# Patient Record
Sex: Female | Born: 1965 | Race: Asian | Hispanic: No | Marital: Married | Smoking: Never smoker
Health system: Southern US, Community
[De-identification: ages and names within clinical notes are randomized; demographics above are authoritative.]

## PROBLEM LIST (undated history)

## (undated) DIAGNOSIS — H409 Unspecified glaucoma: Secondary | ICD-10-CM

## (undated) DIAGNOSIS — R197 Diarrhea, unspecified: Secondary | ICD-10-CM

## (undated) DIAGNOSIS — C20 Malignant neoplasm of rectum: Secondary | ICD-10-CM

## (undated) DIAGNOSIS — Z923 Personal history of irradiation: Secondary | ICD-10-CM

## (undated) HISTORY — PX: OTHER SURGICAL HISTORY: SHX169

## (undated) HISTORY — PX: TUBAL LIGATION: SHX77

## (undated) HISTORY — PX: CARPAL TUNNEL RELEASE: SHX101

## (undated) HISTORY — DX: Unspecified glaucoma: H40.9

## (undated) HISTORY — DX: Personal history of irradiation: Z92.3

## (undated) HISTORY — DX: Malignant neoplasm of rectum: C20

---

## 2007-11-11 ENCOUNTER — Other Ambulatory Visit: Admission: RE | Admit: 2007-11-11 | Discharge: 2007-11-11 | Payer: Self-pay | Admitting: Obstetrics and Gynecology

## 2010-05-31 ENCOUNTER — Ambulatory Visit (HOSPITAL_COMMUNITY): Admission: RE | Admit: 2010-05-31 | Discharge: 2010-05-31 | Payer: Self-pay | Admitting: Orthopaedic Surgery

## 2010-12-08 LAB — URINALYSIS, ROUTINE W REFLEX MICROSCOPIC
Bilirubin Urine: NEGATIVE
Glucose, UA: NEGATIVE mg/dL
Protein, ur: NEGATIVE mg/dL
Specific Gravity, Urine: >1.03 — ABNORMAL HIGH (ref 1.005–1.030)

## 2010-12-08 LAB — DIFFERENTIAL
Eosinophils Absolute: 0.2 10*3/uL (ref 0.0–0.7)
Lymphs Abs: 2.4 10*3/uL (ref 0.7–4.0)
Monocytes Absolute: 0.5 10*3/uL (ref 0.1–1.0)
Monocytes Relative: 7 % (ref 3–12)
Neutrophils Relative %: 52 % (ref 43–77)

## 2010-12-08 LAB — CBC
HCT: 31.9 % — ABNORMAL LOW (ref 36.0–46.0)
Hemoglobin: 10.3 g/dL — ABNORMAL LOW (ref 12.0–15.0)
MCH: 24.4 pg — ABNORMAL LOW (ref 26.0–34.0)
MCHC: 32.2 g/dL (ref 30.0–36.0)
RBC: 4.22 MIL/uL (ref 3.87–5.11)

## 2010-12-08 LAB — COMPREHENSIVE METABOLIC PANEL
ALT: 12 U/L (ref 0–35)
AST: 20 U/L (ref 0–37)
Albumin: 3.7 g/dL (ref 3.5–5.2)
Alkaline Phosphatase: 44 U/L (ref 39–117)
Calcium: 9.3 mg/dL (ref 8.4–10.5)
GFR calc Af Amer: >60 mL/min (ref >60)
Glucose, Bld: 134 mg/dL — ABNORMAL HIGH (ref 70–99)
Potassium: 3.6 mEq/L (ref 3.5–5.1)
Sodium: 134 mEq/L — ABNORMAL LOW (ref 135–145)
Total Protein: 6.2 g/dL (ref 6.0–8.3)

## 2010-12-08 LAB — SURGICAL PCR SCREEN: Staphylococcus aureus: NEGATIVE

## 2010-12-08 LAB — URINE MICROSCOPIC-ADD ON

## 2011-07-17 ENCOUNTER — Encounter: Payer: Self-pay | Admitting: Urgent Care

## 2011-07-17 ENCOUNTER — Ambulatory Visit (INDEPENDENT_AMBULATORY_CARE_PROVIDER_SITE_OTHER): Payer: BC Managed Care – PPO | Admitting: Urgent Care

## 2011-07-17 VITALS — BP 125/79 | HR 62 | Temp 97.6°F | Ht <= 58 in | Wt 99.6 lb

## 2011-07-17 DIAGNOSIS — R197 Diarrhea, unspecified: Secondary | ICD-10-CM

## 2011-07-17 DIAGNOSIS — R109 Unspecified abdominal pain: Secondary | ICD-10-CM

## 2011-07-17 DIAGNOSIS — R1031 Right lower quadrant pain: Secondary | ICD-10-CM | POA: Insufficient documentation

## 2011-07-17 NOTE — Patient Instructions (Signed)
Continue Bentyl 30 minutes before each meal as needed for diarrhea Begin ALIGN once daily for bloating, gas, and diarrhea Return your stool studies to the lab as soon as possible and have your labs drawn. We will call you with results.  Diarrhea Diarrhea is watery poop (stool). The most common cause of diarrhea is a germ. Other causes include:  Food poisoning.   A reaction to medicine.  HOME CARE   Drink clear fluids. This can stop you from losing too much body fluid (dehydration).   Drink enough fluids to keep your pee (urine) clear or pale yellow.   Avoid solid foods and dairy products until you start to feel better. Then start eating bland foods, such as:   Bananas.   Rice.   Crackers.   Applesauce.   Dry toast.   Avoid spicy foods, caffeine, and alcohol.   Your doctor may give medicine to help with cramps and watery poop. Take this as told. Avoid these medicines if you have a fever or blood in your poop.   Take your medicine as told. Finish them even if you start to feel better.  GET HELP RIGHT AWAY IF:   The watery poop lasts longer than 3 days.   You have a fever.   Your baby is older than 3 months with a rectal temperature of 100.5 F (38.1 C) or higher for more than 1 day.   There is blood in your poop.   You start to throw up (vomit).   You lose too much fluid.  MAKE SURE YOU:   Understand these instructions.   Will watch your condition.   Will get help right away if you are not doing well or get worse.  Document Released: 02/28/2008 Document Revised: 05/24/2011 Document Reviewed: 02/28/2008 [facility] Patient Information 2012 [place], [place].

## 2011-07-17 NOTE — Assessment & Plan Note (Signed)
Suspect secondary to acute diarrheal illness.

## 2011-07-17 NOTE — Progress Notes (Signed)
Cc to PCP 

## 2011-07-17 NOTE — Assessment & Plan Note (Addendum)
Renee Nicholson is a 45 y.o. female with chronic diarrhea for 2 months.  She has no hx of IBS-type symptoms in the past.  I suspect acute infectious etiology.  Will check thyroid.  Agree w/ anti-spasmodic. Will add probiotic. Check baseline CBC and CMP. If no relief with supportive measures, she is going to colonoscopy with terminal ileoscopy and random biopsies.  Full set of stool studies. Check TTg/IgA

## 2011-07-17 NOTE — Progress Notes (Signed)
Referring Provider: Dr. Sherril Croon Primary Care Physician:  [name], MD, MD Primary Gastroenterologist:  Dr. Darrick Penna  Chief Complaint  Patient presents with  . Diarrhea    goes to the bathroom right after she eats eveytime  . Gas    alot of gas  . Abdominal Pain    HPI:  Renee Nicholson is a 45 y.o. female here as a referral from Dr. Sherril Croon for persistent diarrhea.  Lots of diarrhea x 2 months.  Bentyl & imodium seem to help some, but not 100%.  Every time she eats she has to run to the bathroom.  Very smelly gas & mid-abdominal pain.  Pain 6/10 & intermittent.  Diarrhea 5-6 times per day w/ mucus.  Denies rectal bleeding or melena.  Wt stable.  C/o anorexia secondary to diarrhea.  Denies nausea or vomiting.   Denies any upper GI symptoms including heartburn, indigestion, nausea, vomiting, dysphagia, odynophagia or anorexia.  No ill contacts, no foreign travel, no recent antibiotics, no new herbal supplements.    No past medical history on file.  Past Surgical History  Procedure Date  . Tubal ligation   . Carpal tunnel release     Current Outpatient Prescriptions  Medication Sig Dispense Refill  . dicyclomine (BENTYL) 10 MG capsule Take 10 mg by mouth 4 (four) times daily -  before meals and at bedtime.        . diphenoxylate-atropine (LOMOTIL) 2.5-0.025 MG per tablet Take 1 tablet by mouth 4 (four) times daily as needed.          Allergies as of 07/17/2011  . (No Known Allergies)    Family History  Problem Relation Age of Onset  . Diabetes Mother     History   Social History  . Marital Status: Married    Spouse Name: N/A    Number of Children: 2  . Years of Education: N/A   Occupational History  . self employed, [employer]    Social History Main Topics  . Smoking status: Never Smoker   . Smokeless tobacco: Not on file  . Alcohol Use: No  . Drug Use: No  . Sexually Active: Not on file   Review of Systems: Gen: Denies any fever, chills, sweats, anorexia,  fatigue, weakness, malaise, weight loss, and sleep disorder. CV: Denies chest pain, angina, palpitations, syncope, orthopnea, PND, peripheral edema, and claudication. Resp: Denies dyspnea at rest, dyspnea with exercise, cough, sputum, wheezing, coughing up blood, and pleurisy. GI: Denies vomiting blood, jaundice, and fecal incontinence.   Denies dysphagia or odynophagia. GU : Denies urinary burning, blood in urine, urinary frequency, urinary hesitancy, nocturnal urination, and urinary incontinence. MS: Denies joint pain, limitation of movement, and swelling, stiffness, low back pain, extremity pain. Denies muscle weakness, cramps, atrophy.  Derm: Denies rash, itching, dry skin, hives, moles, warts, or unhealing ulcers.  Psych: Denies depression, anxiety, memory loss, suicidal ideation, hallucinations, paranoia, and confusion. Heme: Denies bruising, bleeding, and enlarged lymph nodes.  Physical Exam: BP 125/79  Pulse 62  Temp(Src) 97.6 F (36.4 C) (Temporal)  Ht 4\' 5"  (1.346 m)  Wt 99 lb 9.6 oz (45.178 kg)  BMI 24.93 kg/m2 General:   Alert,  Well-developed, well-nourished, pleasant and cooperative in NAD. Head:  Normocephalic and atraumatic. Eyes:  Sclera clear, no icterus.   Conjunctiva pink. Ears:  Normal auditory acuity. Nose:  No deformity, discharge,  or lesions. Mouth:  No deformity or lesions, OP pink/moist. Neck:  Supple; no masses or thyromegaly. Lungs:  Clear throughout to auscultation.   No wheezes, crackles, or rhonchi. No acute distress. Heart:  Regular rate and rhythm; no murmurs, clicks, rubs,  or gallops. Abdomen:  Soft, nontender and nondistended. No masses, hepatosplenomegaly or hernias noted. Normal bowel sounds, without guarding, and without rebound.   Rectal:  Deferred.  Msk:  Symmetrical without gross deformities. Normal posture. Pulses:  Normal pulses noted. Extremities:  Without clubbing or edema. Neurologic:  Alert and  oriented x4;  grossly normal  neurologically. Skin:  Intact without significant lesions or rashes. Cervical Nodes:  No significant cervical adenopathy. Psych:  Alert and cooperative. Normal mood and affect.

## 2011-07-18 LAB — CBC WITH DIFFERENTIAL/PLATELET
Basophils Absolute: 0 10*3/uL (ref 0.0–0.1)
HCT: 36.5 % (ref 36.0–46.0)
Hemoglobin: 11.3 g/dL — ABNORMAL LOW (ref 12.0–15.0)
Lymphocytes Relative: 42 % (ref 12–46)
Lymphs Abs: 2.2 10*3/uL (ref 0.7–4.0)
Monocytes Absolute: 0.5 10*3/uL (ref 0.1–1.0)
Monocytes Relative: 10 % (ref 3–12)
Neutro Abs: 2.3 10*3/uL (ref 1.7–7.7)
RBC: 4.83 MIL/uL (ref 3.87–5.11)
WBC: 5.4 10*3/uL (ref 4.0–10.5)

## 2011-07-18 LAB — COMPREHENSIVE METABOLIC PANEL
AST: 19 U/L (ref 0–37)
Albumin: 4.2 g/dL (ref 3.5–5.2)
BUN: 10 mg/dL (ref 6–23)
CO2: 27 mEq/L (ref 19–32)
Calcium: 9.5 mg/dL (ref 8.4–10.5)
Chloride: 101 mEq/L (ref 96–112)
Glucose, Bld: 122 mg/dL — ABNORMAL HIGH (ref 70–99)
Potassium: 4.1 mEq/L (ref 3.5–5.3)

## 2011-07-18 LAB — TSH: TSH: 1.018 u[IU]/mL (ref 0.350–4.500)

## 2011-07-19 ENCOUNTER — Other Ambulatory Visit: Payer: Self-pay | Admitting: Gastroenterology

## 2011-07-19 ENCOUNTER — Other Ambulatory Visit: Payer: Self-pay | Admitting: Urgent Care

## 2011-07-19 DIAGNOSIS — D649 Anemia, unspecified: Secondary | ICD-10-CM

## 2011-07-19 LAB — GIARDIA/CRYPTOSPORIDIUM (EIA)
Cryptosporidium Screen (EIA): NEGATIVE
Giardia Screen (EIA): NEGATIVE

## 2011-07-19 LAB — IGA: IgA: 164 mg/dL (ref 69–380)

## 2011-07-19 NOTE — Progress Notes (Signed)
Discussed anemia, hemoglobin, and ferritin with the patient and her husband. Multiple questions answered. Advised she will need colonoscopy for further evaluation. Both are in agreement with this plan. I have discussed risks & benefits which include, but are not limited to, bleeding, infection, perforation & drug reaction.  The patient agrees with this plan & written consent will be obtained.    Please arrange colonoscopy with Dr. Darrick Penna Will followup on remaining stool studies.

## 2011-07-19 NOTE — Progress Notes (Signed)
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 ______

## 2011-07-19 NOTE — Progress Notes (Signed)
Quick Note:  She has iron deficiency anemia. She will need colonoscopy.  Will await remaining labs.  ______

## 2011-07-19 NOTE — Progress Notes (Signed)
Quick Note:  Please call the lab to see if a ferritin can be added on to the blood already drawn Re:: Anemia Thanks ______

## 2011-07-19 NOTE — Progress Notes (Signed)
PT IS SCHEDULED FOR TCS ON 07/26/11- HUSBAND TO COME BY AND PICK UP INSTRUCTIONS

## 2011-07-20 NOTE — Progress Notes (Signed)
Quick Note:  Stools are normal. Iron deficiency anemia. Patient scheduled for colonoscopy with Dr. Darrick Penna. ______

## 2011-07-25 ENCOUNTER — Telehealth: Payer: Self-pay | Admitting: Urgent Care

## 2011-07-25 MED ORDER — SODIUM CHLORIDE 0.45 % IV SOLN
Freq: Once | INTRAVENOUS | Status: AC
  Administered 2011-07-26: 11:00:00 via INTRAVENOUS

## 2011-07-25 NOTE — Telephone Encounter (Signed)
REVIEWED.  

## 2011-07-25 NOTE — Progress Notes (Signed)
PT NEEDS TCS/?EGD FOR FEDA

## 2011-07-25 NOTE — Telephone Encounter (Signed)
Discussed adding on potential EGD for tomorrow w/ pt & husband if nothing to explain IDA on TCS. I have discussed risks & benefits which include, but are not limited to, bleeding, infection, perforation & drug reaction.  The patient agrees with this plan & written consent will be obtained.   Both agree. Pt will pick up iFOBT this afternoon & return to the office 1st thing in AM prior to going to [facility] endo for procedures. Please send SLF/me results via EPIC as soon as available. Kim added on possible EGD. Thanks

## 2011-07-25 NOTE — Telephone Encounter (Signed)
Pt came by and picked up the iFOBT. She said she will return early tomorrow before she goes to the hospital.

## 2011-07-26 ENCOUNTER — Encounter (HOSPITAL_COMMUNITY): Payer: Self-pay | Admitting: *Deleted

## 2011-07-26 ENCOUNTER — Ambulatory Visit (INDEPENDENT_AMBULATORY_CARE_PROVIDER_SITE_OTHER): Payer: BC Managed Care – PPO | Admitting: Gastroenterology

## 2011-07-26 ENCOUNTER — Other Ambulatory Visit: Payer: Self-pay | Admitting: Gastroenterology

## 2011-07-26 ENCOUNTER — Ambulatory Visit (HOSPITAL_COMMUNITY)
Admission: RE | Admit: 2011-07-26 | Discharge: 2011-07-26 | Disposition: A | Discharge status: Home / Self Care | Payer: BC Managed Care – PPO | Source: Ambulatory Visit | Attending: Gastroenterology | Admitting: Gastroenterology

## 2011-07-26 ENCOUNTER — Encounter (HOSPITAL_COMMUNITY)
Admission: RE | Disposition: A | Discharge status: Home / Self Care | Payer: Self-pay | Source: Ambulatory Visit | Attending: Gastroenterology

## 2011-07-26 DIAGNOSIS — D509 Iron deficiency anemia, unspecified: Secondary | ICD-10-CM | POA: Insufficient documentation

## 2011-07-26 DIAGNOSIS — K921 Melena: Secondary | ICD-10-CM | POA: Insufficient documentation

## 2011-07-26 DIAGNOSIS — D129 Benign neoplasm of anus and anal canal: Secondary | ICD-10-CM | POA: Insufficient documentation

## 2011-07-26 DIAGNOSIS — K621 Rectal polyp: Secondary | ICD-10-CM

## 2011-07-26 DIAGNOSIS — R198 Other specified symptoms and signs involving the digestive system and abdomen: Secondary | ICD-10-CM

## 2011-07-26 DIAGNOSIS — D131 Benign neoplasm of stomach: Secondary | ICD-10-CM

## 2011-07-26 DIAGNOSIS — R195 Other fecal abnormalities: Secondary | ICD-10-CM

## 2011-07-26 DIAGNOSIS — D649 Anemia, unspecified: Secondary | ICD-10-CM

## 2011-07-26 DIAGNOSIS — C20 Malignant neoplasm of rectum: Secondary | ICD-10-CM | POA: Insufficient documentation

## 2011-07-26 DIAGNOSIS — K620 Anal polyp: Secondary | ICD-10-CM

## 2011-07-26 DIAGNOSIS — D126 Benign neoplasm of colon, unspecified: Secondary | ICD-10-CM

## 2011-07-26 DIAGNOSIS — R197 Diarrhea, unspecified: Secondary | ICD-10-CM

## 2011-07-26 DIAGNOSIS — D128 Benign neoplasm of rectum: Secondary | ICD-10-CM | POA: Insufficient documentation

## 2011-07-26 DIAGNOSIS — K648 Other hemorrhoids: Secondary | ICD-10-CM | POA: Insufficient documentation

## 2011-07-26 DIAGNOSIS — R634 Abnormal weight loss: Secondary | ICD-10-CM | POA: Insufficient documentation

## 2011-07-26 HISTORY — PX: COLONOSCOPY: SHX5424

## 2011-07-26 HISTORY — DX: Diarrhea, unspecified: R19.7

## 2011-07-26 HISTORY — PX: ESOPHAGOGASTRODUODENOSCOPY: SHX5428

## 2011-07-26 LAB — IFOBT (OCCULT BLOOD): IFOBT: POSITIVE

## 2011-07-26 SURGERY — COLONOSCOPY
Anesthesia: Moderate Sedation

## 2011-07-26 MED ORDER — MIDAZOLAM HCL 5 MG/5ML IJ SOLN
INTRAMUSCULAR | Status: AC
  Filled 2011-07-26: qty 10

## 2011-07-26 MED ORDER — MEPERIDINE HCL 100 MG/ML IJ SOLN
INTRAMUSCULAR | Status: AC
  Filled 2011-07-26: qty 1

## 2011-07-26 MED ORDER — MIDAZOLAM HCL 5 MG/5ML IJ SOLN
INTRAMUSCULAR | Status: DC | PRN
  Administered 2011-07-26 (×2): 1 mg via INTRAVENOUS
  Administered 2011-07-26: 2 mg via INTRAVENOUS
  Administered 2011-07-26 (×2): 1 mg via INTRAVENOUS

## 2011-07-26 MED ORDER — MEPERIDINE HCL 100 MG/ML IJ SOLN
INTRAMUSCULAR | Status: DC | PRN
  Administered 2011-07-26 (×2): 25 mg via INTRAVENOUS

## 2011-07-26 MED ORDER — BUTAMBEN-TETRACAINE-BENZOCAINE 2-2-14 % EX AERO
INHALATION_SPRAY | CUTANEOUS | Status: DC | PRN
  Administered 2011-07-26: 1 via TOPICAL

## 2011-07-26 NOTE — H&P (Signed)
Vitals - Last Recorded       BP Pulse Temp(Src) Ht Wt BMI    125/79  62  97.6 F (36.4 C) (Temporal)  4\' 5"  (1.346 m)  99 lb 9.6 oz (45.178 kg)  24.93 kg/m2       Progress Notes     Lorenza Burton, NP  07/17/2011 12:52 PM  Signed Referring Provider: Dr. Sherril Croon Primary Care Physician:  [name], MD, MD Primary Gastroenterologist:  Dr. Darrick Penna    Chief Complaint   Patient presents with   .  Diarrhea       goes to the bathroom right after she eats eveytime   .  Gas       alot of gas   .  Abdominal Pain      HPI:  Renee Nicholson is a 45 y.o. female here as a referral from Dr. Sherril Croon for persistent diarrhea.  Lots of diarrhea x 2 months.  Bentyl & imodium seem to help some, but not 100%.  Every time she eats she has to run to the bathroom.  Very smelly gas & mid-abdominal pain.  Pain 6/10 & intermittent.  Diarrhea 5-6 times per day w/ mucus.  Denies rectal bleeding or melena.  Wt stable.  C/o anorexia secondary to diarrhea.  Denies nausea or vomiting.   Denies any upper GI symptoms including heartburn, indigestion, nausea, vomiting, dysphagia, odynophagia or anorexia.  No ill contacts, no foreign travel, no recent antibiotics, no new herbal supplements.     No past medical history on file.    Past Surgical History   Procedure  Date   .  Tubal ligation     .  Carpal tunnel release         Current Outpatient Prescriptions   Medication  Sig  Dispense  Refill   .  dicyclomine (BENTYL) 10 MG capsule  Take 10 mg by mouth 4 (four) times daily -  before meals and at bedtime.           .  diphenoxylate-atropine (LOMOTIL) 2.5-0.025 MG per tablet  Take 1 tablet by mouth 4 (four) times daily as needed.               Allergies as of 07/17/2011   .  (No Known Allergies)       Family History   Problem  Relation  Age of Onset   .  Diabetes  Mother         History       Social History   .  Marital Status:  Married       Spouse Name:  N/A       Number of Children:  2   .  Years  of Education:  N/A       Occupational History   .  self employed, [employer]         Social History Main Topics   .  Smoking status:  Never Smoker    .  Smokeless tobacco:  Not on file   .  Alcohol Use:  No   .  Drug Use:  No   .  Sexually Active:  Not on file      Review of Systems: Gen: Denies any fever, chills, sweats, anorexia, fatigue, weakness, malaise, weight loss, and sleep disorder. CV: Denies chest pain, angina, palpitations, syncope, orthopnea, PND, peripheral edema, and claudication. Resp: Denies dyspnea at rest, dyspnea with exercise, cough, sputum, wheezing, coughing up blood, and pleurisy. GI:  Denies vomiting blood, jaundice, and fecal incontinence.   Denies dysphagia or odynophagia. GU : Denies urinary burning, blood in urine, urinary frequency, urinary hesitancy, nocturnal urination, and urinary incontinence. MS: Denies joint pain, limitation of movement, and swelling, stiffness, low back pain, extremity pain. Denies muscle weakness, cramps, atrophy.   Derm: Denies rash, itching, dry skin, hives, moles, warts, or unhealing ulcers.   Psych: Denies depression, anxiety, memory loss, suicidal ideation, hallucinations, paranoia, and confusion. Heme: Denies bruising, bleeding, and enlarged lymph nodes.   Physical Exam: BP 125/79  Pulse 62  Temp(Src) 97.6 F (36.4 C) (Temporal)  Ht 4\' 5"  (1.346 m)  Wt 99 lb 9.6 oz (45.178 kg)  BMI 24.93 kg/m2 General:   Alert,  Well-developed, well-nourished, pleasant and cooperative in NAD. Head:  Normocephalic and atraumatic. Eyes:  Sclera clear, no icterus.   Conjunctiva pink. Ears:  Normal auditory acuity. Nose:  No deformity, discharge,  or lesions. Mouth:  No deformity or lesions, OP pink/moist. Neck:  Supple; no masses or thyromegaly. Lungs:  Clear throughout to auscultation.   No wheezes, crackles, or rhonchi. No acute distress. Heart:  Regular rate and rhythm; no murmurs, clicks, rubs,  or gallops. Abdomen:  Soft,  nontender and nondistended. No masses, hepatosplenomegaly or hernias noted. Normal bowel sounds, without guarding, and without rebound.    Rectal:  Deferred.   Msk:  Symmetrical without gross deformities. Normal posture. Pulses:  Normal pulses noted. Extremities:  Without clubbing or edema. Neurologic:  Alert and  oriented x4;  grossly normal neurologically. Skin:  Intact without significant lesions or rashes. Cervical Nodes:  No significant cervical adenopathy. Psych:  Alert and cooperative. Normal mood and affect.     Renee Nicholson  07/17/2011  2:09 PM  Signed Cc to PCP  Lorenza Burton, NP  07/19/2011  8:24 AM  Signed Quick Note:   Please call the lab to see if a ferritin can be added on to the blood already drawn Re:: Anemia Thanks ______  Cloria Spring, LPN, LPN  78/29/5621  8:46 AM  Signed Quick Note:   Roney Marion @ [facility]. Added the ferritin. ______  Lorenza Burton, NP  07/19/2011  2:27 PM  Signed Discussed anemia, hemoglobin, and ferritin with the patient and her husband. Multiple questions answered. Advised she will need colonoscopy for further evaluation. Both are in agreement with this plan. I have discussed risks & benefits which include, but are not limited to, bleeding, infection, perforation & drug reaction.  The patient agrees with this plan & written consent will be obtained.     Please arrange colonoscopy with Dr. Darrick Penna Will followup on remaining stool studies.  Renee Nicholson  07/19/2011  2:52 PM  Signed PT IS SCHEDULED FOR TCS ON 07/26/11- HUSBAND TO COME BY AND PICK UP INSTRUCTIONS    Lorenza Burton, NP  07/20/2011  8:50 AM  Signed Quick Note:   Stools are normal. Iron deficiency anemia. Patient scheduled for colonoscopy with Dr. Darrick Penna. ______  Renee Eva, MD  07/25/2011 12:53 PM  Signed PT NEEDS TCS/?EGD FOR FEDA     Diarrhea - Lorenza Burton, NP  07/17/2011 12:52 PM  Addendum Renee Nicholson is a 45 y.o.  female with chronic diarrhea for 2 months.  She has no hx of IBS-type symptoms in the past.  I suspect acute infectious etiology.  Will check thyroid.  Agree w/ anti-spasmodic. Will add probiotic. Check baseline CBC and CMP. If no relief with supportive measures, she is going to colonoscopy  with terminal ileoscopy and random biopsies.   Full set of stool studies. Check TTg/IgA     Previous Version  Abdominal pain Lorenza Burton, NP  07/17/2011 12:50 PM  Signed Suspect secondary to acute diarrheal illness.

## 2011-07-26 NOTE — Interval H&P Note (Signed)
History and Physical Interval Note:   07/26/2011   12:06 PM   Renee Nicholson  has presented today for surgery, with the diagnosis of ANEMIA  The various methods of treatment have been discussed with the patient and family. After consideration of risks, benefits and other options for treatment, the patient has consented to  Procedure(s): COLONOSCOPY ESOPHAGOGASTRODUODENOSCOPY (EGD) as a surgical intervention .  The patients' history has been reviewed, patient examined, no change in status, stable for surgery.  I have reviewed the patients' chart and labs.  Questions were answered to the patient's satisfaction.     [name]  MD  THE PATIENT WAS EXAMINED AND THERE IS NO CHANGE IN THE PATIENT'S CONDITION SINCE THE ORIGINAL H&P WAS COMPLETED.

## 2011-07-27 ENCOUNTER — Encounter: Payer: Self-pay | Admitting: Gastroenterology

## 2011-07-27 ENCOUNTER — Ambulatory Visit (HOSPITAL_COMMUNITY)
Admission: RE | Admit: 2011-07-27 | Discharge: 2011-07-27 | Disposition: A | Discharge status: Home / Self Care | Payer: BC Managed Care – PPO | Source: Ambulatory Visit | Attending: Gastroenterology | Admitting: Gastroenterology

## 2011-07-27 ENCOUNTER — Other Ambulatory Visit: Payer: Self-pay | Admitting: Gastroenterology

## 2011-07-27 ENCOUNTER — Ambulatory Visit: Payer: Self-pay | Admitting: Gastroenterology

## 2011-07-27 VITALS — BP 122/77 | HR 76 | Temp 98.1°F | Ht <= 58 in | Wt 97.8 lb

## 2011-07-27 DIAGNOSIS — C20 Malignant neoplasm of rectum: Secondary | ICD-10-CM | POA: Insufficient documentation

## 2011-07-27 LAB — CBC WITH DIFFERENTIAL/PLATELET
Basophils Absolute: 0 10*3/uL (ref 0.0–0.1)
Eosinophils Relative: 2 % (ref 0–5)
Lymphocytes Relative: 30 % (ref 12–46)
Lymphs Abs: 1.9 10*3/uL (ref 0.7–4.0)
Neutro Abs: 3.9 10*3/uL (ref 1.7–7.7)
Neutrophils Relative %: 60 % (ref 43–77)
Platelets: 271 10*3/uL (ref 150–400)
RBC: 4.9 MIL/uL (ref 3.87–5.11)
RDW: 16 % — ABNORMAL HIGH (ref 11.5–15.5)
WBC: 6.5 10*3/uL (ref 4.0–10.5)

## 2011-07-27 LAB — COMPREHENSIVE METABOLIC PANEL
ALT: 9 U/L (ref 0–35)
AST: 19 U/L (ref 0–37)
CO2: 26 mEq/L (ref 19–32)
Calcium: 9.7 mg/dL (ref 8.4–10.5)
Chloride: 103 mEq/L (ref 96–112)
Creat: 0.7 mg/dL (ref 0.50–1.10)
Potassium: 3.8 mEq/L (ref 3.5–5.3)
Sodium: 141 mEq/L (ref 135–145)
Total Protein: 7.1 g/dL (ref 6.0–8.3)

## 2011-07-27 MED ORDER — IOHEXOL 300 MG/ML  SOLN
95.0000 mL | Freq: Once | INTRAMUSCULAR | Status: AC | PRN
  Administered 2011-07-27: 95 mL via INTRAVENOUS

## 2011-07-27 NOTE — Progress Notes (Signed)
HEME POS

## 2011-07-27 NOTE — Patient Instructions (Addendum)
YOUR RECTAL ULTRASOUND IS SCHEDULED FOR TOMORROW. DR. Dulce Sellar WILL CONTACT YOU WITH THE APPOINTMENT TIME AND LOCATION. TAKE IRON (BIFERA) ONCE A DAY. DRINK ENSURE OR BOOST 3 CANS A DAY. GET YOUR LABS AND XRAYS. HAVE FILMS COPIED TO A DISC. YOUR APPT WITH DR. Epifania Gore WILL BE MADE NEXT WEEK AFTER HE REVIEWS ALL THE INFORMATION. FOLLOW UP IN 6 MOS.

## 2011-07-27 NOTE — Progress Notes (Signed)
   Called by Dr. Luisa Hart, [place] [facility], this AM and pt has confirmed rectal adenoCA. PT HAD TCS/EGD 10/31-MULTIPLE ADENOMAS, RECTAL CA, FUNDIC GLAND POLYPS. Pt seen and husband available for interpretation. She speaks & understands limited amount of [place]. Pt is Hindu from [place] and doesn't eat meat. Discussed results and management with pt, & husband,&  via phone to her brother-in-law and brother. Explained her 1st degree relatives need a TCS at age 40 and then every 5 years and that this has likely occurred 2o to a genetic mutation.  Pt needs labs/X-rays at [facility]. Rectal U/S [place]. REFER TO SURGERY [facility], DR. MARK KORUDA, [id number]), [id number]). SPOKE BEVERLY WILL MAKE APPT AFTER RECTAL U/S IS SCHEDULED. RECTAL U/S SCHEDULED FOR 11/2. LABS AND [facility] 11/1.  PT OR HUSBAND MAY CALL WITH QUESTIONS, [phone]. OK TO DISCUSS WITH DR. Karilyn Cota. RESULTS DISCUSSED WITH PCP: DR. VYAS.

## 2011-07-28 ENCOUNTER — Telehealth: Payer: Self-pay | Admitting: Gastroenterology

## 2011-07-28 ENCOUNTER — Ambulatory Visit (HOSPITAL_COMMUNITY)
Admission: RE | Admit: 2011-07-28 | Discharge: 2011-07-28 | Disposition: A | Discharge status: Home / Self Care | Payer: BC Managed Care – PPO | Source: Ambulatory Visit | Attending: Gastroenterology | Admitting: Gastroenterology

## 2011-07-28 DIAGNOSIS — C20 Malignant neoplasm of rectum: Secondary | ICD-10-CM | POA: Insufficient documentation

## 2011-07-28 NOTE — Telephone Encounter (Signed)
Notes faxed to Dr Dulce Sellar - pt scheduled for rectal [place] on 07/28/11

## 2011-07-28 NOTE — Telephone Encounter (Signed)
Called pt's husband. Pt does not have evidence for distant disease. Rectal U/S shows a few areas with invasion through the muscularis propria. Will fax all records to Dr. Epifania Gore on MON AM.

## 2011-07-31 ENCOUNTER — Telehealth: Payer: Self-pay | Admitting: Gastroenterology

## 2011-07-31 NOTE — Telephone Encounter (Signed)
All notes faxed to Dr Epifania Gore @ [facility]

## 2011-07-31 NOTE — Telephone Encounter (Signed)
All notes faxed to Dr Koruda @ [facility]  

## 2011-08-01 ENCOUNTER — Encounter (HOSPITAL_COMMUNITY): Payer: Self-pay | Admitting: Gastroenterology

## 2011-08-01 ENCOUNTER — Telehealth: Payer: Self-pay | Admitting: Gastroenterology

## 2011-08-01 ENCOUNTER — Encounter: Payer: Self-pay | Admitting: Gastroenterology

## 2011-08-01 NOTE — Telephone Encounter (Signed)
Pt is scheduled to see Dr Epifania Gore @ [facility] on 08/11/11 @ 2:00

## 2011-08-01 NOTE — Telephone Encounter (Signed)
Opened in era  

## 2011-08-03 ENCOUNTER — Telehealth: Payer: Self-pay | Admitting: Gastroenterology

## 2011-08-03 NOTE — Telephone Encounter (Signed)
Pt's husband called and asked if SF could type letter about pt's condition so she could apply for medicare (disability). Pt's husband asked for SF to call him [phone]

## 2011-08-07 NOTE — Telephone Encounter (Signed)
LETTER TYPED. PLEASE CALL HUSBAND TO PICK IT UP.

## 2011-08-07 NOTE — Telephone Encounter (Signed)
Letter is at front for pick up. Copy to be scanned in.

## 2011-08-07 NOTE — Telephone Encounter (Signed)
Called, pt answered. She was informed to tell her husband.

## 2011-08-15 ENCOUNTER — Telehealth: Payer: Self-pay | Admitting: Oncology

## 2011-08-15 NOTE — Telephone Encounter (Signed)
S/w husband re appt for 11/21 @ 8:30 am w/dr Welton Flakes. D/t per KK/amy.

## 2011-08-16 ENCOUNTER — Other Ambulatory Visit (HOSPITAL_BASED_OUTPATIENT_CLINIC_OR_DEPARTMENT_OTHER): Payer: BC Managed Care – PPO | Admitting: Lab

## 2011-08-16 ENCOUNTER — Ambulatory Visit: Payer: BC Managed Care – PPO

## 2011-08-16 ENCOUNTER — Encounter: Payer: Self-pay | Admitting: Oncology

## 2011-08-16 ENCOUNTER — Other Ambulatory Visit: Payer: BC Managed Care – PPO | Admitting: Lab

## 2011-08-16 ENCOUNTER — Ambulatory Visit (HOSPITAL_BASED_OUTPATIENT_CLINIC_OR_DEPARTMENT_OTHER): Payer: BC Managed Care – PPO | Admitting: Oncology

## 2011-08-16 ENCOUNTER — Telehealth: Payer: Self-pay | Admitting: *Deleted

## 2011-08-16 VITALS — BP 129/85 | HR 74 | Temp 98.1°F | Ht <= 58 in | Wt 95.4 lb

## 2011-08-16 DIAGNOSIS — C20 Malignant neoplasm of rectum: Secondary | ICD-10-CM

## 2011-08-16 DIAGNOSIS — D509 Iron deficiency anemia, unspecified: Secondary | ICD-10-CM

## 2011-08-16 DIAGNOSIS — IMO0002 Reserved for concepts with insufficient information to code with codable children: Secondary | ICD-10-CM

## 2011-08-16 LAB — CBC WITH DIFFERENTIAL/PLATELET
Basophils Absolute: 0 10*3/uL (ref 0.0–0.1)
EOS%: 2.4 % (ref 0.0–7.0)
Eosinophils Absolute: 0.1 10*3/uL (ref 0.0–0.5)
LYMPH%: 32 % (ref 14.0–49.7)
MCH: 24.9 pg — ABNORMAL LOW (ref 25.1–34.0)
MCV: 76.5 fL — ABNORMAL LOW (ref 79.5–101.0)
MONO%: 9.1 % (ref 0.0–14.0)
NEUT#: 3 10*3/uL (ref 1.5–6.5)
Platelets: 276 10*3/uL (ref 145–400)
RBC: 4.86 10*6/uL (ref 3.70–5.45)

## 2011-08-16 LAB — COMPREHENSIVE METABOLIC PANEL
Alkaline Phosphatase: 62 U/L (ref 39–117)
BUN: 13 mg/dL (ref 6–23)
Glucose, Bld: 91 mg/dL (ref 70–99)
Sodium: 139 mEq/L (ref 135–145)
Total Bilirubin: 0.3 mg/dL (ref 0.3–1.2)

## 2011-08-16 NOTE — Telephone Encounter (Signed)
Gave patient appointment for 08-22-2011. Gave patient appointment with dr.murray on 08-22-2011

## 2011-08-16 NOTE — Progress Notes (Signed)
Renee Nicholson [id number] [birth year] 45 y.o. 08/16/2011 3:04 PM  CC Dr. Sherril Croon Dr.Mark [name] Dr. [name]   REASON FOR CONSULTATION:  Female(T3 N1) by rectal ultrasound. Seen for consideration of neoadjuvant chemotherapy and radiation therapy.   REFERRING PHYSICIAN: Dr. Sherril Croon  HISTORY OF PRESENT ILLNESS:  Renee Nicholson is a 46 y.o. female [place]. Patient has been pretty healthy majority of her life. He months ago patient started having diarrhea with multiple bowel movements. She was seen by her primary care physician and conservative treatments were recommended. But in spite of that she continue to have diarrheal movements about 5-6 a day. She had a workup done including a CBC that she was found to be anemic with  a hemoglobin of 10.With MCV in the microcytic range. Due to ongoing diarrheal stoolsand iron deficient anemia the Patient went on to have a  colonoscopy with biopsies performed and snare polypectomy. The colonoscopy showed multiple sessile polyps in the cecum ascending transverse and sigmoid colon as well as the rectum. Patient had a biopsy performed large exophytic rectal mass about 3-4 cm. This is approximately 1 cm above the dentate line. Small hemorrhoids were noted and the terminal ileum was normal. Was then seen by Dr. [name] at [facility] underwent ultrasound. 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 It was recommended patient undergo neoadjuvant chemotherapy. Dr. [name].discussed with the patient that it may eventually be not possible to spare her sphincter. And that she may eventually need a permanent colostomy. Obvious reason for the patient is quite upset over  Past Medical History  Diagnosis Date  . Anemia   . Diarrhea   . Rectal cancer 08/16/2011  . Colon cancer   . Anxiety     Past Surgical History  Procedure Date  . Tubal ligation   . Carpal tunnel release   .  Colonoscopy 07/26/2011    Procedure: COLONOSCOPY;  Surgeon: Arlyce Harman, MD;  [facility];  Service: Endoscopy;  Laterality: N/A;  10:40  . Esophagogastroduodenoscopy 07/26/2011    Procedure: ESOPHAGOGASTRODUODENOSCOPY (EGD);  Surgeon: Arlyce Harman, MD;  [facility];  Service: Endoscopy;  Laterality: N/A;    Family History  Problem Relation Age of Onset  . Diabetes Mother   . Colon cancer Neg Hx   . Cancer Brother 35    rectal cancer lives in [place]    Social History History  Substance Use Topics  . Smoking status: Never Smoker   . Smokeless tobacco: Not on file  . Alcohol Use: No    No Known Allergies  Current Outpatient Prescriptions  Medication Sig Dispense Refill  . bifidobacterium infantis (ALIGN) capsule Take 1 capsule by mouth daily.        . Calcium Carb-Cholecalciferol (CALCIUM 500 +D) 500-400 MG-UNIT TABS Take by mouth.        . calcium-vitamin D (OSCAL WITH D) 500-200 MG-UNIT per tablet Take 1 tablet by mouth 3 (three) times daily.        . diphenoxylate-atropine (LOMOTIL) 2.5-0.025 MG per tablet Take 1 tablet by mouth 4 (four) times daily as needed. For stomach        REVIEW OF SYSTEMS:  Constitutional: positive for anorexia, fatigue and weight loss Eyes: positive for conjunctival pallor sclera is anicteric Ears, nose, mouth, throat, and face: negative Respiratory: negative Cardiovascular: negative Gastrointestinal: negative Genitourinary:negative Musculoskeletal:negative Neurological: negative  PHYSICAL EXAMINATION:  WUJ:WJXBJ, healthy, anxious,  mild distress and pale SKIN: skin color, texture, turgor are normal, no rashes or significant lesions HEAD: Normocephalic, No masses, lesions, tenderness or abnormalities EYES: PERRLA, EOMI, conjunctival pallor EARS: External ears normal OROPHARYNX:no exudate, no erythema, lips, buccal mucosa, and tongue normal and dentition normal  NECK: supple, no adenopathy, no bruits, no JVD,  thyroid normal size, non-tender, without nodularity LYMPH:  no palpable lymphadenopathy, no hepatosplenomegaly BREAST:not examined LUNGS: negative findings:  chest symmetric with normal A/P diameter, no chest deformities noted, normal respiratory rate and rhythm, no chest wall tenderness HEART: regular rate & rhythm, no murmurs and no gallops ABDOMEN:abdomen soft, non-tender, normal bowel sounds and no masses or organomegaly BACK: Back symmetric, no curvature., No CVA tenderness, Range of motion is normal EXTREMITIES:no joint deformities, effusion, or inflammation, no edema, no skin discoloration, no clubbing, no cyanosis  NEURO: alert & oriented x 3 with fluent speech, no focal motor/sensory deficits, gait normal, reflexes normal and symmetric    STUDIES/RESULTS: Ct Abdomen Pelvis W Contrast  07/27/2011  *RADIOLOGY REPORT*  Clinical Data: Rectal cancer, colonoscopy 07/26/2011  CT ABDOMEN AND PELVIS WITH CONTRAST  Technique:  Multidetector CT imaging of the abdomen and pelvis was performed following the standard protocol during bolus administration of intravenous contrast.  Contrast: 95mL OMNIPAQUE IOHEXOL 300 MG/ML IV SOLN the  Comparison: None.  Findings: Lung bases are clear.  No focal hepatic lesion.  The gallbladder, pancreas, spleen, adrenal glands, and kidneys are normal.  The stomach, small bowel, and colon are normal.  No evidence of bowel obstruction.  Abdominal aorta is normal caliber.  No retroperitoneal or periportal lymphadenopathy.  The rectosigmoid colon appears normal.  The rectal mass identified on colonoscopy is not evident by CT.  There is a small 4 mm lymph node in the perirectal fat left of rectum (image 63). 6 mm lymph node in the presacral space posterior right to the rectum (image 56).  A third higher  4 mm lymph node posterior uterus (image 52). No evidence of iliac and lymphadenopathy.  Uterus and bladder are normal.  The ovaries are not well identified and therefore likely  normal.  Review of  bone windows demonstrates no aggressive osseous lesions. There is sclerosis of the SI joints on the left and right.  IMPRESSION:  1.  Rectal mass described on colonoscopy is not to identify by CT. No bowel obstruction.  2. Small perirectal lymph nodes are concerning for local nodal metastasis.  3.  No evidence of metastasis within the abdomen or lung bases. 4.  Sclerosis of the SI joints.  Original Report Authenticated By: Genevive Bi, M.D.    PATHOLOGY: Colonoscopy performed on 07/26/2011 with biopsies. The rectal biopsy revealed an adenocarcinoma. No audible polyps were also biopsied including ascending colon which showed tubular adenomas no high-grade dysplasia or malignancy transverse colon polyps showed tubular adenoma sigmoid colon polyps tubular adenoma stomach biopsies showed benign fundic gland polyps.    ASSESSMENT    45 year old [place] female who does not speak much [place] and does have a Nurse, learning disability with her today. She is now presenting with new diagnosis of stage III adenocarcinoma of the rectum. She had a rectal ultrasound performed at [facility]. The ultrasound did show a T3 lesion with and 1. Patient is being seen here now in medical oncology for consideration of neoadjuvant chemotherapy for her new diagnosis of rectal cancer. Patient has been seen by surgery at [facility]. She has been counseled that the best way to approach this out the  treatment is to do chemotherapy up front followed by surgery. Also noted in the notes is that there may or may not be a possibility of performing sphincter sparing surgery since patient has such extensive disease. She may end up having a lifelong colostomy. Of course patient does not want to do this and she is hoping that she will be able to have a sphincter sparing surgery eventually performed. PLAN:    The patient I. and her husband as well as her sister-in-law who accompanied her will in fact lives in [place]. We had a  extensive discussion regarding the diagnosis pathology and rationale for treatment. It has been discussed with them the patient will receive concurrent chemoradiation therapy. Patient is going to be seen by Dr. Ballard Russell for the radiation therapy. An appointment has RE been set up for early next week. In the meantime we discussed concurrent chemotherapy today. Recommendation made was tape side of being given concurrently with radio therapy to be used as a radiosensitizer. The side effects of this approach as well as the benefits. I do think the benefits would far outweigh the risks she may experience. Side effects discussed toward diarrhea nausea vomiting hand-foot syndrome myelosuppression. We discussed how to overcome these and to take care of her. We also the fact the patient is so young and has rectal cancer. She also has a brother who had rectal cancer at an early onset. I took an extensive family history and these are the only 2 members who actually have had rectal cancer any kind of cancers. We do think that she should be seen by genetics to undergo genetic counseling and testing. Patient does have multiple polyps and that certainly is consistent concern for familial predisposition to GI malignancy. I have also set the patient up I have also set the patient up to be seen by her dietitian for nutritional counseling. Of note patient is a vegetarian. And we do need to work with this to be able to give her the optimal nutrition.     All questions were answered. The patient knows to call the clinic with any problems, questions or concerns. We can certainly see the patient much sooner if necessary.  Thank you so much for allowing me to participate in the care of [employer]. I will continue to follow up the patient with you and assist in her care.  I spent 55 minutes counseling the patient face to face. The total time spent in the appointment was 80 minutes.  Drue Second, MD Medical/Oncology [facility] [phone] (beeper) [phone] (Office)  08/16/2011, 3:04 PM 08/16/2011, 3:04 PM

## 2011-08-22 ENCOUNTER — Ambulatory Visit: Payer: BC Managed Care – PPO | Admitting: Radiation Oncology

## 2011-08-22 ENCOUNTER — Telehealth: Payer: Self-pay | Admitting: Oncology

## 2011-08-22 ENCOUNTER — Ambulatory Visit: Payer: BC Managed Care – PPO

## 2011-08-22 ENCOUNTER — Ambulatory Visit (HOSPITAL_BASED_OUTPATIENT_CLINIC_OR_DEPARTMENT_OTHER): Payer: BC Managed Care – PPO | Admitting: Oncology

## 2011-08-22 VITALS — BP 147/84 | HR 76 | Temp 98.2°F | Ht <= 58 in | Wt 95.9 lb

## 2011-08-22 DIAGNOSIS — R197 Diarrhea, unspecified: Secondary | ICD-10-CM

## 2011-08-22 DIAGNOSIS — C20 Malignant neoplasm of rectum: Secondary | ICD-10-CM

## 2011-08-22 NOTE — Telephone Encounter (Signed)
Gv pt appts for [id number].  scheduled pet scan for 08/31/2011 @  6:45am @ [facility]

## 2011-08-22 NOTE — Progress Notes (Signed)
OFFICE PROGRESS NOTE  CC: [name], MD [name], MD  [name], MD, MD [address] [place] [address]  DIAGNOSIS: 45 year old female with new diagnosis of rectal carcinoma by rectal ultrasound she was found to have a T3 N1 lesion. She was last seen by me on 08/16/2011.  PRIOR THERAPY:   #1 patient presented with a several month history of diarrhea and then subsequent bleeding. She was found to be anemic with a hemoglobin of 10. Because of this she went on to have a colonoscopy performed. The colonoscopy showed multiple sessile polyps in the cecum ascending transverse and sigmoid colon as well as the rectum. The rectal area showed a large exophytic rectal mass approximately 1 cm above the dentate line measuring 3-4 cm. She had a biopsy of this performed and was found to be an adenocarcinoma consistent with a rectal primary.  #2 patient was seen by Dr. [name] at [facility] who performed a rectal ultrasound. The staging clinically was T3 N1. It was recommended by Dr. [name] the patient undergo neoadjuvant chemotherapy and radiation higher to her definitive surgery.  #3 patient is currently scheduled to be seen by Dr. [name] in radiation oncology at [facility]. Her appointment originally was for today 08/22/2011. Unfortunately this was changed to 08/24/2011.  CURRENT THERAPY: Patient awaiting radiation oncology evaluation. Once this is complete and a date is known for when she will start radiation I will plan on giving her radiosensitizing chemotherapy consisting of single agent Xeloda.  INTERVAL HISTORY: Rajni U Everard 45 y.o. female returns for followup visit today. She is accompanied by her husband and her sister-in-law who is again visiting from [place]. They are very concerned about the delay in radiation oncology evaluation. They wanted me to see if I could get her seen today by one of the radiation oncologists. I spoke to Dr. Lurline Hare.  She offered to graciously see the patient however she did say that she she would not be able to spend enough time with them do to her schedule being very tight. She also stated that it would be well worth it if patient was seen by Dr. [name] on Thursday and at that time they could potentially start simulation process so that she could be started on her definitive radiation therapy early next week. After explaining this to the patient she is in agreement to holding her visit to Thursday with Dr. Dayton Scrape.  Otherwise patient is doing fine. She has started oral iron she has also started probiotics. This is helping her diarrhea a little bit. She does not complain of having ongoing bleeding grossly. She is denying any nausea vomiting fevers chills night sweats headaches no shortness of breath. Her appetite is still poor. She is trying very hard to maintain her weight.  MEDICAL HISTORY: Past Medical History  Diagnosis Date  . Anemia   . Diarrhea   . Rectal cancer 08/16/2011  . Colon cancer   . Anxiety     ALLERGIES:   has no known allergies.  MEDICATIONS:  Current Outpatient Prescriptions  Medication Sig Dispense Refill  . polysaccharide iron (NIFEREX) 150 MG CAPS capsule Take 150 mg by mouth daily.        . bifidobacterium infantis (ALIGN) capsule Take 1 capsule by mouth daily.        . Calcium Carb-Cholecalciferol (CALCIUM 500 +D) 500-400 MG-UNIT TABS Take by mouth.        . calcium-vitamin D (OSCAL WITH D)  500-200 MG-UNIT per tablet Take 1 tablet by mouth 3 (three) times daily.        . diphenoxylate-atropine (LOMOTIL) 2.5-0.025 MG per tablet Take 1 tablet by mouth 4 (four) times daily as needed. For stomach        SURGICAL HISTORY:  Past Surgical History  Procedure Date  . Tubal ligation   . Carpal tunnel release   . Colonoscopy 07/26/2011    Procedure: COLONOSCOPY;  Surgeon: Arlyce Harman, MD;  [facility];  Service: Endoscopy;  Laterality: N/A;  10:40  .  Esophagogastroduodenoscopy 07/26/2011    Procedure: ESOPHAGOGASTRODUODENOSCOPY (EGD);  Surgeon: Arlyce Harman, MD;  [facility];  Service: Endoscopy;  Laterality: N/A;    REVIEW OF SYSTEMS:  Pertinent items are noted in HPI.   PHYSICAL EXAMINATION: General appearance: alert, cooperative, appears stated age, mild distress and pale Resp: clear to auscultation bilaterally and normal percussion bilaterally Cardio: regular rate and rhythm, S1, S2 normal, no murmur, click, rub or gallop GI: soft, non-tender; bowel sounds normal; no masses,  no organomegaly Extremities: extremities normal, atraumatic, no cyanosis or edema Neurologic: Alert and oriented X 3, normal strength and tone. Normal symmetric reflexes. Normal coordination and gait  ECOG PERFORMANCE STATUS: 0 - Asymptomatic  Blood pressure 147/84, pulse 76, temperature 98.2 F (36.8 C), temperature source Oral, height 4' 8.5" (1.435 m), weight 95 lb 14.4 oz (43.5 kg).  LABORATORY DATA: Lab Results  Component Value Date   WBC 5.4 08/16/2011   HGB 12.1 08/16/2011   HCT 37.2 08/16/2011   MCV 76.5* 08/16/2011   PLT 276 08/16/2011      Chemistry      Component Value Date/Time   NA 139 08/16/2011 1122   K 4.1 08/16/2011 1122   CL 101 08/16/2011 1122   CO2 29 08/16/2011 1122   BUN 13 08/16/2011 1122   CREATININE 0.66 08/16/2011 1122   CREATININE 0.70 07/27/2011 1306      Component Value Date/Time   CALCIUM 9.6 08/16/2011 1122   ALKPHOS 62 08/16/2011 1122   AST 19 08/16/2011 1122   ALT 9 08/16/2011 1122   BILITOT 0.3 08/16/2011 1122       RADIOGRAPHIC STUDIES:  Ct Abdomen Pelvis W Contrast  07/27/2011  *RADIOLOGY REPORT*  Clinical Data: Rectal cancer, colonoscopy 07/26/2011  CT ABDOMEN AND PELVIS WITH CONTRAST  Technique:  Multidetector CT imaging of the abdomen and pelvis was performed following the standard protocol during bolus administration of intravenous contrast.  Contrast: 95mL OMNIPAQUE IOHEXOL 300  MG/ML IV SOLN the  Comparison: None.  Findings: Lung bases are clear.  No focal hepatic lesion.  The gallbladder, pancreas, spleen, adrenal glands, and kidneys are normal.  The stomach, small bowel, and colon are normal.  No evidence of bowel obstruction.  Abdominal aorta is normal caliber.  No retroperitoneal or periportal lymphadenopathy.  The rectosigmoid colon appears normal.  The rectal mass identified on colonoscopy is not evident by CT.  There is a small 4 mm lymph node in the perirectal fat left of rectum (image 63). 6 mm lymph node in the presacral space posterior right to the rectum (image 56).  A third higher  4 mm lymph node posterior uterus (image 52). No evidence of iliac and lymphadenopathy.  Uterus and bladder are normal.  The ovaries are not well identified and therefore likely normal.  Review of  bone windows demonstrates no aggressive osseous lesions. There is sclerosis of the SI joints on the left and right.  IMPRESSION:  1.  Rectal mass described on colonoscopy is not to identify by CT. No bowel obstruction.  2. Small perirectal lymph nodes are concerning for local nodal metastasis.  3.  No evidence of metastasis within the abdomen or lung bases. 4.  Sclerosis of the SI joints.  Original Report Authenticated By: Genevive Bi, M.D.    ASSESSMENT: 45 year old female with stage III adenocarcinoma of the rectum. Being considered for neoadjuvant concurrent chemoradiation therapy. The chemotherapy will consist of daily Xeloda given concurrently with radiation therapy. The patient is awaiting to be seen by Dr. Dayton Scrape on Thursday of this week.   PLAN: I have reassured the patient stating that she will get her therapy in a timely fashion. I will plan on giving her a Xeloda prescription once I know the exact starting time of the radiation therapy. I spent considerable amount of time coordinating her care with all of the specialties. All questions were answered today. She will be seen back in 2  weeks' time for followup.  I have set her up for chemotherapy teaching class as well for her   All questions were answered. The patient knows to call the clinic with any problems, questions or concerns. We can certainly see the patient much sooner if necessary.  I spent 20 minutes counseling the patient face to face. The total time spent in the appointment was 30 minutes.    Drue Second, MD Medical/Oncology [facility] [phone] (beeper) [phone] (Office)  08/22/2011, 4:22 PM

## 2011-08-23 NOTE — Progress Notes (Signed)
Patient ID: Renee Nicholson, female   DOB: 1966/02/09, 45 y.o.   MRN: [id number] [place].

## 2011-08-23 NOTE — Progress Notes (Signed)
Patient ID: Renee Nicholson, female   DOB: 1966/01/25, 45 y.o.   MRN: [id number]   Pt came by wanting some samples of Align. I gave her 2 boxes(that is all we had)

## 2011-08-24 ENCOUNTER — Other Ambulatory Visit: Payer: Self-pay | Admitting: *Deleted

## 2011-08-24 ENCOUNTER — Ambulatory Visit
Admission: RE | Admit: 2011-08-24 | Discharge: 2011-08-24 | Disposition: A | Discharge status: Home / Self Care | Payer: BC Managed Care – PPO | Source: Ambulatory Visit | Attending: Radiation Oncology | Admitting: Radiation Oncology

## 2011-08-24 ENCOUNTER — Encounter: Payer: Self-pay | Admitting: Radiation Oncology

## 2011-08-24 ENCOUNTER — Telehealth: Payer: Self-pay | Admitting: Radiation Oncology

## 2011-08-24 ENCOUNTER — Other Ambulatory Visit: Payer: BC Managed Care – PPO

## 2011-08-24 ENCOUNTER — Ambulatory Visit: Payer: BC Managed Care – PPO

## 2011-08-24 ENCOUNTER — Encounter: Payer: Self-pay | Admitting: *Deleted

## 2011-08-24 ENCOUNTER — Institutional Professional Consult (permissible substitution): Payer: BC Managed Care – PPO | Admitting: Radiation Oncology

## 2011-08-24 ENCOUNTER — Other Ambulatory Visit: Payer: Self-pay | Admitting: Oncology

## 2011-08-24 ENCOUNTER — Ambulatory Visit: Admission: RE | Admit: 2011-08-24 | Payer: BC Managed Care – PPO | Source: Ambulatory Visit

## 2011-08-24 VITALS — BP 127/79 | HR 73 | Temp 97.7°F | Resp 18 | Ht <= 58 in | Wt 96.8 lb

## 2011-08-24 DIAGNOSIS — K6289 Other specified diseases of anus and rectum: Secondary | ICD-10-CM | POA: Insufficient documentation

## 2011-08-24 DIAGNOSIS — K649 Unspecified hemorrhoids: Secondary | ICD-10-CM | POA: Insufficient documentation

## 2011-08-24 DIAGNOSIS — Z510 Encounter for antineoplastic radiation therapy: Secondary | ICD-10-CM | POA: Insufficient documentation

## 2011-08-24 DIAGNOSIS — Z79899 Other long term (current) drug therapy: Secondary | ICD-10-CM | POA: Insufficient documentation

## 2011-08-24 DIAGNOSIS — R197 Diarrhea, unspecified: Secondary | ICD-10-CM | POA: Insufficient documentation

## 2011-08-24 DIAGNOSIS — C20 Malignant neoplasm of rectum: Secondary | ICD-10-CM | POA: Insufficient documentation

## 2011-08-24 DIAGNOSIS — Z809 Family history of malignant neoplasm, unspecified: Secondary | ICD-10-CM | POA: Insufficient documentation

## 2011-08-24 MED ORDER — ONDANSETRON HCL 8 MG PO TABS: 8.0000 mg | ORAL_TABLET | Freq: Three times a day (TID) | ORAL | Status: AC | PRN

## 2011-08-24 MED ORDER — PROCHLORPERAZINE MALEATE 10 MG PO TABS: 10.0000 mg | ORAL_TABLET | Freq: Four times a day (QID) | ORAL | Status: AC | PRN

## 2011-08-24 MED ORDER — CAPECITABINE 150 MG PO TABS: 150.0000 mg | ORAL_TABLET | Freq: Two times a day (BID) | ORAL | Status: AC

## 2011-08-24 MED ORDER — CAPECITABINE 500 MG PO TABS: 500.0000 mg | ORAL_TABLET | Freq: Two times a day (BID) | ORAL | Status: AC

## 2011-08-24 NOTE — Progress Notes (Signed)
Simulation/Treatment Planning Note:  The patient underwent simulation/treatment planning today in the management of her carcinoma of the rectum. She was placed prone on a belly board. A BB was placed along the anus. She was then scanned. She was set up to PA, and right left lateral fields. 3 separate multileaf collimators were designed to conform the field. I contoured the rectum, bladder, and tumor. I prescribing 4500 cGy in 25 sessions utilizing mixed 6 MV/18 MV photons. 3 separate multileaf collimators were designed to conform the field. An isodose plan is requested along with dosimetry. She will receive concomitant Xeloda and this represents a special treatment procedure. We can expect increased toxicity in terms of lowered blood counts and also more diarrhea with combined modality therapy. Her pelvic field will be followed by a boost to her rectal primary for a further 540 cGy in 3 sessions.

## 2011-08-24 NOTE — Progress Notes (Signed)
Please see the Nurse Progress Note in the MD Initial Consult Encounter for this patient. 

## 2011-08-24 NOTE — Progress Notes (Signed)
Encounter addended by: Amanda Pea, RN on: 08/24/2011  4:42 PM<BR>     Documentation filed: Charges VN

## 2011-08-24 NOTE — Telephone Encounter (Signed)
Met with patient to discuss RO billing. Patient states she is applying for Medicare and wants a letter and medical records from Dr. Dayton Scrape to take with her when she comes back on the Tues 12/4.  Dx: 154.1 Rectum, NOS  Rad Tx: [id number]

## 2011-08-24 NOTE — Progress Notes (Signed)
States 3 - 4 diarrhea stool daily, has rectal pain in am , rates 6/10

## 2011-08-24 NOTE — Progress Notes (Signed)
[facility] Radiation Oncology NEW PATIENT EVALUATION  Name: Renee Nicholson MRN: [id number]  Date: 08/24/2011  DOB: 02/08/1966  Status: outpatient   WU:JWJX,BJYNW B., MD, MD  Victorino December., MD    REFERRING PHYSICIAN: Victorino December., MD   DIAGNOSIS: Stage III (T3, N1, M0) adenocarcinoma of the rectum    HISTORY OF PRESENT ILLNESS::Renee Nicholson is a 46 y.o. female who is seen today for the courtesy of Dr. Welton Flakes for consideration of preoperative radiation therapy along with Xeloda in the management of her stage III adenocarcinoma of the rectum. She first noted rectal bleeding and diarrhea approximately 4-5 months ago. She was found to be anemic with a hemoglobin of 10. She was referred by Dr. Sherril Croon to Dr. Darrick Penna for colonoscopy. She noted a large exophytic echo mass beginning 1 cm above the dentate line measuring 3-4 cm in greatest length. She also had numerous colonic polyps. A biopsy of the rectal mass was diagnostic for adenocarcinoma. The patient was seen by Dr. [name] at [facility] who performed a rectal ultrasound staging her as T3 N1. He recommended preoperative chemoradiation. Her staging workup included a CT scan of the abdomen and pelvis on November 1, and the rectal mass was not identified. There were small. Rectal lymph nodes, concerning for local nodal metastases. His CEA is normal at 0.7. The patient was seen in consultation by Dr. Welton Flakes who is in agreement with preoperative Xeloda/radiation therapy. She continues to have some rectal bleeding. She does have rectal pain, primarily in the morning. She states that she has lost approximately 5 pounds over the past 2-3 months.  Marland Kitchen   PREVIOUS RADIATION THERAPY: No   PAST MEDICAL HISTORY:  has a past medical history of Anemia; Diarrhea; Rectal cancer (08/16/2011); Colon cancer; and Anxiety.     PAST SURGICAL HISTORY: Past Surgical History  Procedure Date  . Tubal ligation   . Carpal tunnel release   .  Colonoscopy 07/26/2011    Procedure: COLONOSCOPY;  Surgeon: Arlyce Harman, MD;  [facility];  Service: Endoscopy;  Laterality: N/A;  10:40  . Esophagogastroduodenoscopy 07/26/2011    Procedure: ESOPHAGOGASTRODUODENOSCOPY (EGD);  Surgeon: Arlyce Harman, MD;  Location: [facility];  Service: Endoscopy;  Laterality: N/A;     FAMILY HISTORY: family history includes Cancer (age of onset:35) in her brother and Diabetes in her mother.  There is no history of Colon cancer.   SOCIAL HISTORY:  reports that she has never smoked. She does not have any smokeless tobacco history on file. She reports that she does not drink alcohol or use illicit drugs.   ALLERGIES: Review of patient's allergies indicates no known allergies.   MEDICATIONS:  Current Outpatient Prescriptions  Medication Sig Dispense Refill  . bifidobacterium infantis (ALIGN) capsule Take 1 capsule by mouth daily.        . Calcium Carb-Cholecalciferol (CALCIUM 500 +D) 500-400 MG-UNIT TABS Take by mouth.        . calcium-vitamin D (OSCAL WITH D) 500-200 MG-UNIT per tablet Take 1 tablet by mouth 3 (three) times daily.        . diphenoxylate-atropine (LOMOTIL) 2.5-0.025 MG per tablet Take 1 tablet by mouth 4 (four) times daily as needed. For stomach      . polysaccharide iron (NIFEREX) 150 MG CAPS capsule Take 150 mg by mouth daily.            REVIEW OF SYSTEMS:  Pertinent items are noted in HPI.  PHYSICAL EXAM:  height is 4\' 5"  (1.346 m) and weight is 96 lb 12.8 oz (43.908 kg). Her oral temperature is 97.7 F (36.5 C). Her blood pressure is 127/79 and her pulse is 73. Her respiration is 18.  Head and neck examination grossly unremarkable. Nodes: Without palpable cervical or supraclavicular lymphadenopathy. No inguinal lymphadenopathy. Chest: Lungs clear. Back: Without spinal or CVA tenderness. Heart: Regular rate rhythm. Abdomen: Without masses or organomegaly. Pelvic: Bimanual and digital rectal examination remarkable  for an exophytic tumor arising on the posterior wall of the rectum beginning 1 cm above the dentate line and extending for 3-4 cm proximally. Extremities without edema.    LABORATORY DATA:  Lab Results  Component Value Date   WBC 5.4 08/16/2011   HGB 12.1 08/16/2011   HCT 37.2 08/16/2011   MCV 76.5* 08/16/2011   PLT 276 08/16/2011   Lab Results  Component Value Date   NA 139 08/16/2011   K 4.1 08/16/2011   CL 101 08/16/2011   CO2 29 08/16/2011   Lab Results  Component Value Date   ALT 9 08/16/2011   AST 19 08/16/2011   ALKPHOS 62 08/16/2011   BILITOT 0.3 08/16/2011        IMPRESSION: Stage III (T3, N1, M0) adenocarcinoma of the rectum. I agree with preoperative chemoradiation to improve local regional control and to improve overall survival. I discussed the potential acute and late toxicities of radiation therapy and chemotherapy with the patient and her family. They were given the opportunity to ask questions. Consent was signed. She was then taken to the CT simulator for treatment planning.  PLAN: She'll complete her treatment planning this week and begin her region therapy on Tuesday, December 4. Dr. Welton Flakes will write for her Xeloda prescription today. The family inquires about having her surgery closer to home in [place] and I gave him the name of Dr. Donell Beers.   I spent 60 minutes minutes face to face with the patient and more than 50% of that time was spent in counseling and/or coordination of care.

## 2011-08-29 ENCOUNTER — Ambulatory Visit
Admission: RE | Admit: 2011-08-29 | Discharge: 2011-08-29 | Disposition: A | Discharge status: Home / Self Care | Payer: BC Managed Care – PPO | Source: Ambulatory Visit | Attending: Radiation Oncology | Admitting: Radiation Oncology

## 2011-08-29 ENCOUNTER — Telehealth: Payer: Self-pay | Admitting: *Deleted

## 2011-08-29 ENCOUNTER — Ambulatory Visit: Payer: BC Managed Care – PPO | Admitting: Nutrition

## 2011-08-29 NOTE — Progress Notes (Signed)
1340. Called to dressing room in treatment area by therapist. Patient reports a dry cough and aching all over body. Assessed vitals. All vitals WDL. Encouraged patient to follow up with PCP. Encouraged patient rest and hydrate. Educated patient on how to prevent transmission of infection amongst others within the same home. Patient verbalized understanding of all.  Patient verbalized understanding. Patient safety to be treated with pelvic radiation today.

## 2011-08-29 NOTE — Telephone Encounter (Signed)
Verbal order received and read back from Dr. Welton Flakes for patient to start xeloda and she is not to receive the flu vaccine.  Patient not to take xeloda on Saturday or Sunday and to resume xeloda on Monday.  Next f/u appointment is next week on 09-05-11.   Called number at 12:40pm and they have left for radiation.

## 2011-08-29 NOTE — Telephone Encounter (Signed)
Reached spouse at this time.  He will make sure she begins the xeloda today.  Verbalized understanding that she is not to take this in Sat. Or Sun. and to resume on Monday.  Asked if she may take tylenol and this nurse explained that tylenol will help with body aches and temperature.  No further questions.

## 2011-08-29 NOTE — Telephone Encounter (Signed)
1. Patient's spouse called asking if patient is to begin taking the xeloda that was shipped today.   2. Patient has a dry cough and flu like symptoms.  Temp = 100.0 or 99.0 at times.  Her body aches and she has bone pain.   3. Will be en route leaving [place] soon for a 1:15 radiation appt.   4. Also asked if someone can give a flu- shot when she arrives today.  Will notify providers.

## 2011-08-30 ENCOUNTER — Ambulatory Visit
Admission: RE | Admit: 2011-08-30 | Discharge: 2011-08-30 | Disposition: A | Discharge status: Home / Self Care | Payer: BC Managed Care – PPO | Source: Ambulatory Visit | Attending: Radiation Oncology | Admitting: Radiation Oncology

## 2011-08-30 NOTE — Procedures (Signed)
Renee Nicholson underwent simulation verification today for her pelvic setup. Her isocenter was in good position, and the multileaf collimators contoured the treatment volume appropriately.    ______________________________ Maryln Gottron, M.D. RJM/MEDQ  D:  08/29/2011  T:  08/30/2011  Job:  [id number]

## 2011-08-31 ENCOUNTER — Ambulatory Visit
Admission: RE | Admit: 2011-08-31 | Discharge: 2011-08-31 | Disposition: A | Discharge status: Home / Self Care | Payer: BC Managed Care – PPO | Source: Ambulatory Visit | Attending: Radiation Oncology | Admitting: Radiation Oncology

## 2011-08-31 ENCOUNTER — Encounter (HOSPITAL_COMMUNITY)
Admission: RE | Admit: 2011-08-31 | Discharge: 2011-08-31 | Disposition: A | Discharge status: Home / Self Care | Payer: BC Managed Care – PPO | Source: Ambulatory Visit | Attending: Oncology | Admitting: Oncology

## 2011-08-31 DIAGNOSIS — J3489 Other specified disorders of nose and nasal sinuses: Secondary | ICD-10-CM | POA: Insufficient documentation

## 2011-08-31 DIAGNOSIS — Z923 Personal history of irradiation: Secondary | ICD-10-CM | POA: Insufficient documentation

## 2011-08-31 DIAGNOSIS — Z79899 Other long term (current) drug therapy: Secondary | ICD-10-CM | POA: Insufficient documentation

## 2011-08-31 DIAGNOSIS — C20 Malignant neoplasm of rectum: Secondary | ICD-10-CM | POA: Insufficient documentation

## 2011-08-31 DIAGNOSIS — E079 Disorder of thyroid, unspecified: Secondary | ICD-10-CM | POA: Insufficient documentation

## 2011-08-31 MED ORDER — FLUDEOXYGLUCOSE F - 18 (FDG) INJECTION: 18.2000 | Freq: Once | INTRAVENOUS | Status: DC | PRN

## 2011-09-01 ENCOUNTER — Ambulatory Visit
Admission: RE | Admit: 2011-09-01 | Discharge: 2011-09-01 | Disposition: A | Discharge status: Home / Self Care | Payer: BC Managed Care – PPO | Source: Ambulatory Visit | Attending: Radiation Oncology | Admitting: Radiation Oncology

## 2011-09-01 NOTE — Assessment & Plan Note (Signed)
REASON FOR ASSESSMENT: This is a 45 year old female patient of Dr. Dayton Scrape and Dr. Welton Flakes diagnosed with rectal cancer receiving concomitant Xeloda and radiation treatment.  MEDICAL HISTORY INCLUDES:  Anemia and anxiety.  MEDICATIONS INCLUDE:  Align, calcium plus vitamin D and Lomotil.  LABS:  Were reviewed.  HEIGHT:  4 feet 8 inches. WEIGHT:  96 pounds. USUAL BODY WEIGHT:  99 pounds.  The patient received her 1st radiation therapy today.  She is not having excessive diarrhea at this point as she is too early in her treatment for that particular side effect.  However, it is expected.  She does report a poor appetite and a 3-pound weight loss.  She is a vegetarian. She was told by her physician to not eat any wheat products or dairy products.  Because she is of [place] heritage, she prefers to eat foods  that are spicy.  She does eat a lot of fiber-containing foods in her normal diet.  She is concerned regarding what she can and cannot eat.  NUTRITION DIAGNOSIS:  Food and nutrition-related knowledge deficit related to new diagnosis of rectal cancer and associated treatments as evidenced by no prior need for nutrition-related information.  INTERVENTION:  I have educated the patient and the family on the importance of adequate protein and calories for weight maintenance throughout treatment.  We reviewed vegetarian sources of protein that would be better tolerated.  I have discouraged high-fiber foods, but clarified that she can have wheat products as long as they are not high in fiber.  We also discussed the importance of avoiding gas-containing high-fiber vegetables.  I have encouraged her to add in a nutritional beverage such as Ensure or Boost to provide some extra calories and protein.  I have provided her detailed fact sheets along with multiple nutritional beverage samples and coupons for her to try.  I have answered the patient's and the family's questions.  MONITORING/EVALUATION (GOALS):  The  patient will tolerate her oral diet to minimize side effects during treatment and to promote minimal weight loss.  NEXT VISIT:  The patient prefers to call me if she has any questions or concerns while she is receiving treatment.  At this point, she is satisfied with the information given.  Nutrition diagnosis of food and nutrition-related knowledge deficit has resolved.   ______________________________ Zenovia Jarred, RD, LDN Clinical Nutrition Specialist BN/MEDQ  D:  08/29/2011  T:  08/30/2011  Job:  [id number]

## 2011-09-04 ENCOUNTER — Ambulatory Visit
Admission: RE | Admit: 2011-09-04 | Discharge: 2011-09-04 | Disposition: A | Discharge status: Home / Self Care | Payer: BC Managed Care – PPO | Source: Ambulatory Visit | Attending: Radiation Oncology | Admitting: Radiation Oncology

## 2011-09-04 VITALS — Wt 95.8 lb

## 2011-09-04 DIAGNOSIS — C20 Malignant neoplasm of rectum: Secondary | ICD-10-CM

## 2011-09-04 NOTE — Progress Notes (Signed)
CONTINUES TO HAVE DIARRHEA, HAS NOT TAKEN IMODIUM.  ALSO FEELS BURNING WITH BM'S.  NOTED AND AND FOOT SYNDROME FROM CHEMO.  ADVISED PT SHE NEEDED TO TAKE IMODIUM IF DIARRHEA PERSISTED

## 2011-09-04 NOTE — Progress Notes (Signed)
Weekly Management Note:  Site:Rectum/pelvis Current Dose:  900  cGy Projected Dose: 5040  cGy  Narrative: The patient is seen today for routine under treatment assessment. CBCT/MVCT images/port films were reviewed. The chart was reviewed.   She does have continued rectal frequency, however she does not wish to take Imodium. Still having some rectal bleeding. CBC pending. Continues with Xeloda.  Physical Examination: There were no vitals filed for this visit..  Weight: 95 lb 12.8 oz (43.455 kg). No change.  Impression: Tolerating radiation therapy well. Rectal frequency from primary tumor.   Plan: Continue radiation therapy as planned.Reviewed low residue diet with the patient. Rescheduled with Dr. Donell Beers in January.

## 2011-09-05 ENCOUNTER — Telehealth: Payer: Self-pay | Admitting: Oncology

## 2011-09-05 ENCOUNTER — Ambulatory Visit
Admission: RE | Admit: 2011-09-05 | Discharge: 2011-09-05 | Disposition: A | Discharge status: Home / Self Care | Payer: BC Managed Care – PPO | Source: Ambulatory Visit | Attending: Radiation Oncology | Admitting: Radiation Oncology

## 2011-09-05 ENCOUNTER — Ambulatory Visit: Payer: BC Managed Care – PPO

## 2011-09-05 ENCOUNTER — Telehealth: Payer: Self-pay | Admitting: *Deleted

## 2011-09-05 ENCOUNTER — Ambulatory Visit (HOSPITAL_BASED_OUTPATIENT_CLINIC_OR_DEPARTMENT_OTHER): Payer: BC Managed Care – PPO | Admitting: Oncology

## 2011-09-05 VITALS — BP 125/77 | HR 76 | Temp 98.0°F | Ht <= 58 in | Wt 96.4 lb

## 2011-09-05 DIAGNOSIS — Z09 Encounter for follow-up examination after completed treatment for conditions other than malignant neoplasm: Secondary | ICD-10-CM

## 2011-09-05 DIAGNOSIS — C20 Malignant neoplasm of rectum: Secondary | ICD-10-CM

## 2011-09-05 LAB — CBC WITH DIFFERENTIAL/PLATELET
BASO%: 0.3 % (ref 0.0–2.0)
EOS%: 8.9 % — ABNORMAL HIGH (ref 0.0–7.0)
MCH: 24.9 pg — ABNORMAL LOW (ref 25.1–34.0)
MCHC: 32.7 g/dL (ref 31.5–36.0)
MCV: 76.2 fL — ABNORMAL LOW (ref 79.5–101.0)
MONO%: 8.1 % (ref 0.0–14.0)
RDW: 18.2 % — ABNORMAL HIGH (ref 11.2–14.5)
lymph#: 1.1 10*3/uL (ref 0.9–3.3)

## 2011-09-05 LAB — BASIC METABOLIC PANEL
BUN: 11 mg/dL (ref 6–23)
CO2: 27 mEq/L (ref 19–32)
Calcium: 8.8 mg/dL (ref 8.4–10.5)
Chloride: 102 mEq/L (ref 96–112)
Creatinine, Ser: 0.49 mg/dL — ABNORMAL LOW (ref 0.50–1.10)
Glucose, Bld: 104 mg/dL — ABNORMAL HIGH (ref 70–99)

## 2011-09-05 NOTE — Telephone Encounter (Signed)
Gave patient an Programmer, systems.

## 2011-09-05 NOTE — Telephone Encounter (Signed)
gave patient weekly labs and md visiting starting 08-2011 thru 09-2011 printed out calendar and gave to the patient

## 2011-09-05 NOTE — Progress Notes (Signed)
OFFICE PROGRESS NOTE  CC: [name], MD [name], MD [name], MD [name], MD, MD [address] [place] [address] [name], MD  DIAGNOSIS: 45 year old female with new diagnosis of rectal carcinoma by rectal ultrasound she was found to have a T3 N1 lesion. She was last seen by me on 08/16/2011.  PRIOR THERAPY:   #1 patient presented with a several month history of diarrhea and then subsequent bleeding. She was found to be anemic with a hemoglobin of 10. Because of this she went on to have a colonoscopy performed. The colonoscopy showed multiple sessile polyps in the cecum ascending transverse and sigmoid colon as well as the rectum. The rectal area showed a large exophytic rectal mass approximately 1 cm above the dentate line measuring 3-4 cm. She had a biopsy of this performed and was found to be an adenocarcinoma consistent with a rectal primary.  #2 patient was seen by Dr. [name] at [facility] who performed a rectal ultrasound. The staging clinically was T3 N1. It was recommended by Dr. [name] the patient undergo neoadjuvant chemotherapy and radiation higher to her definitive surgery.  #3 patient is currently scheduled to be seen by Dr. [name] in radiation oncology at [facility]. Her appointment originally was for today 08/22/2011. Unfortunately this was changed to 08/24/2011.  #4 patient has begun radiation therapy with concurrent xeloda at 750 mg BID  CURRENT THERAPY: Single agent xeloda given concurrently with radiation starting 08/22/2011   INTERVAL HISTORY: Renee Nicholson 45 y.o. female returns for followup visit today. Overall patient is doing well she is without any significant complaints except some rectal irritation secondary to the radiation therapy. She is denying having any fevers chills night sweats headaches shortness of breath chest pains palpitations no myalgias or arthralgias. She hasn't noticed ongoing  diarrheal stool with some specks of blood. She has no dominant no pain or pelvic pain. She has no difficulty swallowing she has not noticed any sores in her mouth. She occasionally does have headaches no double vision blurring of vision no difficulty in swallowing no dryness of her mouth she has no shortness of breath no chest pains palpitations no nausea vomiting abdominal pain she does have some diarrhea. She has no back pain no peripheral paresthesias she has not noticed any skin rashes. Remainder of the 10 point review of systems is negative.   MEDICAL HISTORY: Past Medical History  Diagnosis Date  . Anemia   . Diarrhea   . Rectal cancer 08/16/2011  . Colon cancer   . Anxiety     ALLERGIES:   has no known allergies.  MEDICATIONS:  Current Outpatient Prescriptions  Medication Sig Dispense Refill  . capecitabine (XELODA) 150 MG tablet Take 1 tablet (150 mg total) by mouth 2 (two) times daily after a meal. 1 tab po BID mon, tues, wed.,thurs,.fri and stop for the weekend  60 tablet  2  . capecitabine (XELODA) 500 MG tablet Take 1 tablet (500 mg total) by mouth 2 (two) times daily after a meal. 1 tab po BID  Mon, tues,wed,thurs,fri. Stop for the weekend  60 tablet  2  . polysaccharide iron (NIFEREX) 150 MG CAPS capsule Take 150 mg by mouth daily.        . bifidobacterium infantis (ALIGN) capsule Take 1 capsule by mouth daily.        . Calcium Carb-Cholecalciferol (CALCIUM 500 +D) 500-400 MG-UNIT TABS Take by mouth.        Marland Kitchen  calcium-vitamin D (OSCAL WITH D) 500-200 MG-UNIT per tablet Take 1 tablet by mouth 3 (three) times daily.        . diphenoxylate-atropine (LOMOTIL) 2.5-0.025 MG per tablet Take 1 tablet by mouth 4 (four) times daily as needed. For stomach        SURGICAL HISTORY:  Past Surgical History  Procedure Date  . Tubal ligation   . Carpal tunnel release   . Colonoscopy 07/26/2011    Procedure: COLONOSCOPY;  Surgeon: Arlyce Harman, MD;  [facility];  Service:  Endoscopy;  Laterality: N/A;  10:40  . Esophagogastroduodenoscopy 07/26/2011    Procedure: ESOPHAGOGASTRODUODENOSCOPY (EGD);  Surgeon: Arlyce Harman, MD;  [facility];  Service: Endoscopy;  Laterality: N/A;    REVIEW OF SYSTEMS:  Pertinent items are noted in HPI.   PHYSICAL EXAMINATION: General appearance: alert, cooperative, appears stated age, mild distress and pale Resp: clear to auscultation bilaterally and normal percussion bilaterally Cardio: regular rate and rhythm, S1, S2 normal, no murmur, click, rub or gallop GI: soft, non-tender; bowel sounds normal; no masses,  no organomegaly Extremities: extremities normal, atraumatic, no cyanosis or edema Neurologic: Alert and oriented X 3, normal strength and tone. Normal symmetric reflexes. Normal coordination and gait  ECOG PERFORMANCE STATUS: 0 - Asymptomatic  Blood pressure 125/77, pulse 76, temperature 98 F (36.7 C), temperature source Oral, height 4\' 5"  (1.346 m), weight 96 lb 6.4 oz (43.727 kg).  LABORATORY DATA: Lab Results  Component Value Date   WBC 3.9 09/05/2011   HGB 11.2* 09/05/2011   HCT 34.3* 09/05/2011   MCV 76.2* 09/05/2011   PLT 244 09/05/2011      Chemistry      Component Value Date/Time   NA 139 08/16/2011 1122   K 4.1 08/16/2011 1122   CL 101 08/16/2011 1122   CO2 29 08/16/2011 1122   BUN 13 08/16/2011 1122   CREATININE 0.66 08/16/2011 1122   CREATININE 0.70 07/27/2011 1306      Component Value Date/Time   CALCIUM 9.6 08/16/2011 1122   ALKPHOS 62 08/16/2011 1122   AST 19 08/16/2011 1122   ALT 9 08/16/2011 1122   BILITOT 0.3 08/16/2011 1122       RADIOGRAPHIC STUDIES:  Ct Abdomen Pelvis W Contrast  07/27/2011  *RADIOLOGY REPORT*  Clinical Data: Rectal cancer, colonoscopy 07/26/2011  CT ABDOMEN AND PELVIS WITH CONTRAST  Technique:  Multidetector CT imaging of the abdomen and pelvis was performed following the standard protocol during bolus administration of intravenous contrast.   Contrast: 95mL OMNIPAQUE IOHEXOL 300 MG/ML IV SOLN the  Comparison: None.  Findings: Lung bases are clear.  No focal hepatic lesion.  The gallbladder, pancreas, spleen, adrenal glands, and kidneys are normal.  The stomach, small bowel, and colon are normal.  No evidence of bowel obstruction.  Abdominal aorta is normal caliber.  No retroperitoneal or periportal lymphadenopathy.  The rectosigmoid colon appears normal.  The rectal mass identified on colonoscopy is not evident by CT.  There is a small 4 mm lymph node in the perirectal fat left of rectum (image 63). 6 mm lymph node in the presacral space posterior right to the rectum (image 56).  A third higher  4 mm lymph node posterior uterus (image 52). No evidence of iliac and lymphadenopathy.  Uterus and bladder are normal.  The ovaries are not well identified and therefore likely normal.  Review of  bone windows demonstrates no aggressive osseous lesions. There is sclerosis of the SI joints  on the left and right.  IMPRESSION:  1.  Rectal mass described on colonoscopy is not to identify by CT. No bowel obstruction.  2. Small perirectal lymph nodes are concerning for local nodal metastasis.  3.  No evidence of metastasis within the abdomen or lung bases. 4.  Sclerosis of the SI joints.  Original Report Authenticated By: Genevive Bi, M.D.   Nm Pet Image Initial (pi) Skull Base To Thigh  08/31/2011  *RADIOLOGY REPORT*  Clinical Data: Initial treatment strategy for staging of rectal carcinoma.  Currently on chemotherapy and radiation therapy.  NUCLEAR MEDICINE PET CT SKULL BASE TO THIGH  Technique:  18.2 mCi F-18 FDG was injected intravenously via the right AC.  Full-ring PET imaging was performed from the skull base through the mid-thighs 63  minutes after injection.  CT data was obtained and used for attenuation correction and anatomic localization only.  (This was not acquired as a diagnostic CT examination.)  Fasting Blood Glucose:  109  Patient Weight:  96  pounds.  Comparison: Abdominal pelvic CT of 07/27/2011  Findings: PET images demonstrate moderate hypermetabolic "brown" fat within the low neck and upper chest.  Mild heterogeneous hypermetabolism corresponding to a dominant right thyroid mass.  This measures 2.9 x 2.8 cm and a S.U.V. max of 4.9 on image 45.  No abnormal activity within the abdomen.  Mild endometrial hypermetabolism is likely physiologic in this 45 year old female. Anorectal primary is identified with hypermetabolism corresponding to mild wall thickening.  This measures a S.U.V. max of 11.9,  CT images performed for attenuation correction demonstrate mucosal thickening within the right maxillary sinus.  Ethmoid air cell mucosal thickening as well.  Mild cardiomegaly.  Abdominal/pelvic findings deferred to recent diagnostic CT.  No acute superimposed process identified.  6 mm right perirectal node on the prior exam is likely identified on image 177, measures 5 mm today.  A left perianal node is also decreased or resolved.  Possibly present at 3 mm on image 188.  Sacroiliac joint sclerosis is greater left than right and not significantly changed. Suspect right-sided coronary artery atherosclerosis, age advanced.  Image 83.  IMPRESSION: 1.  Anorectal primary without evidence of distant metastasis. 2.  Perirectal/peritoneal nodes described on the prior diagnostic CT are less impressive today and may have undergone response to therapy.  Based on location, these were suspicious on the prior. 3.  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 Neoplasm cannot be excluded.  Consider further evaluation with ultrasound. 4.  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  Consider correlation with cardiac risk factors.  5.  Sinus disease.  Original Report Authenticated By: Consuello Bossier, M.D.     ASSESSMENT: 45 year old female with stage III adenocarcinoma of the rectum. Patient has now begun  concurrent chemoradiation therapy with chemotherapy consisting of Xeloda. Overall she is tolerating this quite well without any significant complaints. Patient did have a PET scan performed on 08/31/2011 which showed the anorectal primary without evidence of distant metastases. The perirectal. Needle nodes described in the prior diagnosis CT were less impressive on the PET scan. There was noted to be a right-sided thyroid mass with mild concurrent bedtime hypermetabolism. An ultrasound has been recommended.    PLAN:   Patient will continue the radiation and Xeloda overall she is tolerating it quite well. I will continue to see her on a weekly basis with weekly CBC and chemistries.    All questions were answered. The patient knows  to call the clinic with any problems, questions or concerns. We can certainly see the patient much sooner if necessary.  I spent 20 minutes counseling the patient face to face. The total time spent in the appointment was 30 minutes.    Drue Second, MD Medical/Oncology [facility] [phone] (beeper) [phone] (Office)  09/05/2011, 10:57 AM

## 2011-09-06 ENCOUNTER — Telehealth: Payer: Self-pay | Admitting: *Deleted

## 2011-09-06 ENCOUNTER — Ambulatory Visit
Admission: RE | Admit: 2011-09-06 | Discharge: 2011-09-06 | Disposition: A | Discharge status: Home / Self Care | Payer: BC Managed Care – PPO | Source: Ambulatory Visit | Attending: Radiation Oncology | Admitting: Radiation Oncology

## 2011-09-06 ENCOUNTER — Encounter: Payer: Self-pay | Admitting: Radiation Oncology

## 2011-09-06 NOTE — Telephone Encounter (Signed)
Message copied by Cooper Render on Wed Sep 06, 2011  9:48 AM ------      Message from: Victorino December      Created: Tue Sep 05, 2011  5:30 PM       Call patient: labs look good

## 2011-09-06 NOTE — Progress Notes (Signed)
Weekly Management Note:  Site:Rectum/Pelvis Current Dose:  1260  cGy Projected Dose: 5040  cGy  Narrative: The patient is seen today for routine under treatment assessment. CBCT/MVCT images/port films were reviewed. The chart was reviewed.   No new complaints today.Dec. 11 CBC and chemistries satisfactory. Physical Examination: There were no vitals filed for this visit..  Weight:  . No change.  Impression: Tolerating radiation therapy well.  Plan: Continue radiation therapy as planned.

## 2011-09-06 NOTE — Telephone Encounter (Signed)
Pt.notified

## 2011-09-07 ENCOUNTER — Ambulatory Visit
Admission: RE | Admit: 2011-09-07 | Discharge: 2011-09-07 | Disposition: A | Discharge status: Home / Self Care | Payer: BC Managed Care – PPO | Source: Ambulatory Visit | Attending: Radiation Oncology | Admitting: Radiation Oncology

## 2011-09-07 ENCOUNTER — Telehealth: Payer: Self-pay | Admitting: *Deleted

## 2011-09-07 NOTE — Telephone Encounter (Signed)
She may have flu shot

## 2011-09-07 NOTE — Telephone Encounter (Signed)
Per MD notified- Ok to have flu shot.

## 2011-09-07 NOTE — Telephone Encounter (Signed)
Pt's sister Greta Doom called " Can she have the flu shot?"  Oman requested call back at [phone]. Will review with MD

## 2011-09-08 ENCOUNTER — Ambulatory Visit
Admission: RE | Admit: 2011-09-08 | Discharge: 2011-09-08 | Disposition: A | Discharge status: Home / Self Care | Payer: BC Managed Care – PPO | Source: Ambulatory Visit | Attending: Radiation Oncology | Admitting: Radiation Oncology

## 2011-09-11 ENCOUNTER — Ambulatory Visit
Admission: RE | Admit: 2011-09-11 | Discharge: 2011-09-11 | Disposition: A | Discharge status: Home / Self Care | Payer: BC Managed Care – PPO | Source: Ambulatory Visit | Attending: Radiation Oncology | Admitting: Radiation Oncology

## 2011-09-12 ENCOUNTER — Ambulatory Visit
Admission: RE | Admit: 2011-09-12 | Discharge: 2011-09-12 | Disposition: A | Discharge status: Home / Self Care | Payer: BC Managed Care – PPO | Source: Ambulatory Visit | Attending: Radiation Oncology | Admitting: Radiation Oncology

## 2011-09-13 ENCOUNTER — Ambulatory Visit (HOSPITAL_BASED_OUTPATIENT_CLINIC_OR_DEPARTMENT_OTHER): Payer: BC Managed Care – PPO | Admitting: Oncology

## 2011-09-13 ENCOUNTER — Other Ambulatory Visit (HOSPITAL_BASED_OUTPATIENT_CLINIC_OR_DEPARTMENT_OTHER): Payer: BC Managed Care – PPO | Admitting: Lab

## 2011-09-13 ENCOUNTER — Ambulatory Visit
Admission: RE | Admit: 2011-09-13 | Discharge: 2011-09-13 | Disposition: A | Discharge status: Home / Self Care | Payer: BC Managed Care – PPO | Source: Ambulatory Visit | Attending: Radiation Oncology | Admitting: Radiation Oncology

## 2011-09-13 ENCOUNTER — Telehealth: Payer: Self-pay | Admitting: *Deleted

## 2011-09-13 ENCOUNTER — Encounter: Payer: Self-pay | Admitting: Oncology

## 2011-09-13 DIAGNOSIS — C20 Malignant neoplasm of rectum: Secondary | ICD-10-CM

## 2011-09-13 DIAGNOSIS — K625 Hemorrhage of anus and rectum: Secondary | ICD-10-CM

## 2011-09-13 DIAGNOSIS — K649 Unspecified hemorrhoids: Secondary | ICD-10-CM

## 2011-09-13 DIAGNOSIS — Z79899 Other long term (current) drug therapy: Secondary | ICD-10-CM

## 2011-09-13 LAB — CBC WITH DIFFERENTIAL/PLATELET
Basophils Absolute: 0 10*3/uL (ref 0.0–0.1)
EOS%: 6.9 % (ref 0.0–7.0)
Eosinophils Absolute: 0.2 10*3/uL (ref 0.0–0.5)
HGB: 11.6 g/dL (ref 11.6–15.9)
MCH: 26.8 pg (ref 25.1–34.0)
MCV: 79.7 fL (ref 79.5–101.0)
MONO%: 16.4 % — ABNORMAL HIGH (ref 0.0–14.0)
NEUT#: 2 10*3/uL (ref 1.5–6.5)
RBC: 4.33 10*6/uL (ref 3.70–5.45)
RDW: 20.4 % — ABNORMAL HIGH (ref 11.2–14.5)
lymph#: 0.6 10*3/uL — ABNORMAL LOW (ref 0.9–3.3)

## 2011-09-13 NOTE — Telephone Encounter (Signed)
gave patient appointment for 09-18-2011 printed out calendar and gave to the patient

## 2011-09-13 NOTE — Progress Notes (Signed)
OFFICE PROGRESS NOTE  CC: [name], MD [name], MD [name], MD [name], MD, MD [address] [place] [address] [name], MD  DIAGNOSIS: 45 year old female with new diagnosis of rectal carcinoma by rectal ultrasound she was found to have a T3 N1 lesion. She was last seen by me on 08/16/2011.  PRIOR THERAPY:   #1 patient presented with a several month history of diarrhea and then subsequent bleeding. She was found to be anemic with a hemoglobin of 10. Because of this she went on to have a colonoscopy performed. The colonoscopy showed multiple sessile polyps in the cecum ascending transverse and sigmoid colon as well as the rectum. The rectal area showed a large exophytic rectal mass approximately 1 cm above the dentate line measuring 3-4 cm. She had a biopsy of this performed and was found to be an adenocarcinoma consistent with a rectal primary.  #2 patient was seen by Dr. [name] at [facility] who performed a rectal ultrasound. The staging clinically was T3 N1. It was recommended by Dr. [name] the patient undergo neoadjuvant chemotherapy and radiation higher to her definitive surgery.  #3 patient is currently scheduled to be seen by Dr. [name] in radiation oncology at [facility]. Her appointment originally was for today 08/22/2011. Unfortunately this was changed to 08/24/2011.  #4 patient has begun radiation therapy with concurrent xeloda at 750 mg BID  CURRENT THERAPY: Single agent xeloda given concurrently with radiation starting 08/22/2011   INTERVAL HISTORY: Renee Nicholson 45 y.o. female returns for followup visit today. This R. Is seen for a followup visit. Overall she seems to be doing well. However she is complaining of hemorrhoidal bleeding off-and-on. She is taking been using preparation H. As well as doing sitz baths. She is tolerating the Xeloda quite well except for some mild diarrhea. She continues to tolerate the  radiation reasonably well. She is denying any nausea vomiting fevers chills night sweats headaches no shortness of breath or chest pain. Remainder of the 10 point review of systems is negative.  MEDICAL HISTORY: Past Medical History  Diagnosis Date  . Anemia   . Diarrhea   . Rectal cancer 08/16/2011  . Colon cancer   . Anxiety   . Bright red rectal bleeding 09/13/2011    ALLERGIES:   has no known allergies.  MEDICATIONS:  Current Outpatient Prescriptions  Medication Sig Dispense Refill  . capecitabine (XELODA) 150 MG tablet Take by mouth 2 (two) times daily after a meal.        . capecitabine (XELODA) 500 MG tablet Take by mouth 2 (two) times daily after a meal.        . diphenoxylate-atropine (LOMOTIL) 2.5-0.025 MG per tablet Take 1 tablet by mouth 4 (four) times daily as needed. For stomach      . polysaccharide iron (NIFEREX) 150 MG CAPS capsule Take 150 mg by mouth daily.        . Calcium Carb-Cholecalciferol (CALCIUM 500 +D) 500-400 MG-UNIT TABS Take by mouth.        . calcium-vitamin D (OSCAL WITH D) 500-200 MG-UNIT per tablet Take 1 tablet by mouth 3 (three) times daily.          SURGICAL HISTORY:  Past Surgical History  Procedure Date  . Tubal ligation   . Carpal tunnel release   . Colonoscopy 07/26/2011    Procedure: COLONOSCOPY;  Surgeon: Arlyce Harman, MD;  [facility];  Service: Endoscopy;  Laterality: N/A;  10:40  . Esophagogastroduodenoscopy 07/26/2011    Procedure: ESOPHAGOGASTRODUODENOSCOPY (EGD);  Surgeon: Arlyce Harman, MD;  [facility];  Service: Endoscopy;  Laterality: N/A;    REVIEW OF SYSTEMS:  Pertinent items are noted in HPI.   PHYSICAL EXAMINATION: General appearance: alert, cooperative, appears stated age, mild distress and pale Resp: clear to auscultation bilaterally and normal percussion bilaterally Cardio: regular rate and rhythm, S1, S2 normal, no murmur, click, rub or gallop GI: soft, non-tender; bowel sounds normal;  no masses,  no organomegaly Extremities: extremities normal, atraumatic, no cyanosis or edema Neurologic: Alert and oriented X 3, normal strength and tone. Normal symmetric reflexes. Normal coordination and gait  ECOG PERFORMANCE STATUS: 0 - Asymptomatic  Blood pressure 117/68, pulse 69, temperature 97.9 F (36.6 C), temperature source Oral, height 4\' 5"  (1.346 m), weight 95 lb 11.2 oz (43.409 kg).  LABORATORY DATA: Lab Results  Component Value Date   WBC 3.4* 09/13/2011   HGB 11.6 09/13/2011   HCT 34.5* 09/13/2011   MCV 79.7 09/13/2011   PLT 256 09/13/2011      Chemistry      Component Value Date/Time   NA 138 09/05/2011 1035   K 3.7 09/05/2011 1035   CL 102 09/05/2011 1035   CO2 27 09/05/2011 1035   BUN 11 09/05/2011 1035   CREATININE 0.49* 09/05/2011 1035   CREATININE 0.70 07/27/2011 1306      Component Value Date/Time   CALCIUM 8.8 09/05/2011 1035   ALKPHOS 62 08/16/2011 1122   AST 19 08/16/2011 1122   ALT 9 08/16/2011 1122   BILITOT 0.3 08/16/2011 1122       RADIOGRAPHIC STUDIES:  Nm Pet Image Initial (pi) Skull Base To Thigh 1.  Anorectal primary without evidence of distant metastasis. 2.  Perirectal/peritoneal nodes described on the prior diagnostic CT are less impressive today and may have undergone response to therapy.  Based on location, these were suspicious on the prior. 3.  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 Neoplasm cannot be excluded.  Consider further evaluation with ultrasound. 4.  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  Consider correlation with cardiac risk factors.  5.  Sinus disease.  Original Report Authenticated By: Consuello Bossier, M.D.     ASSESSMENT: 44 year old female with stage III adenocarcinoma of the rectum. Patient has now begun concurrent chemoradiation therapy with chemotherapy consisting of Xeloda. Overall she is tolerating this quite well without any  significant complaints.we discussed hemorrhoidal issues today. I have continued to recommend preparation age as well as sitz baths and symptomatic management. Talked about her diet I have encouraged her to continue eating healthy foods avoid anything that'll irritate her GI tract including milk products and spicy foods.    PLAN:   Patient will continue the radiation and Xeloda overall she is tolerating it quite well. I will continue to see her on a weekly basis with weekly CBC and chemistries. I will see the patient back on 09/18/2011.   All questions were answered. The patient knows to call the clinic with any problems, questions or concerns. We can certainly see the patient much sooner if necessary.  I spent 20 minutes counseling the patient face to face. The total time spent in the appointment was 30 minutes.    Drue Second, MD Medical/Oncology [facility] [phone] (beeper) [phone] (Office)  09/13/2011, 1:58 PM

## 2011-09-14 ENCOUNTER — Ambulatory Visit
Admission: RE | Admit: 2011-09-14 | Discharge: 2011-09-14 | Disposition: A | Discharge status: Home / Self Care | Payer: BC Managed Care – PPO | Source: Ambulatory Visit | Attending: Radiation Oncology | Admitting: Radiation Oncology

## 2011-09-14 VITALS — Wt 95.9 lb

## 2011-09-14 DIAGNOSIS — C20 Malignant neoplasm of rectum: Secondary | ICD-10-CM

## 2011-09-14 NOTE — Progress Notes (Signed)
HEMORRHOIDS REALLY BAD PER PT, CAUSING PAIN SHE RATES 6/10.  USING PREPARATION H AND SITZ BATHS FOR THIS.  ALSO HAVING DIARRHEA APPROX 6 - 7 TIMES A DAY, USING IMODIUM FOR THIS.  SHE HAS RX WITH HER FOR DIPHENCXYLA/ATROPINE AND WANTS TO KNOW IF OK TO TAKE WITH IMODIUM.  I TOLD HER TO ASK DR. MURRAY

## 2011-09-14 NOTE — Progress Notes (Signed)
Weekly Management Note:  Site:Pelvis/rectum Current Dose:  2340  cGy Projected Dose: 5040  cGy  Narrative: The patient is seen today for routine under treatment assessment. CBCT/MVCT images/port films were reviewed. The chart was reviewed.   She continues to have hemorrhoid discomfort. She has been using sitz baths and Preparation H. She is also having frequent diarrhea with up to 7-8 bowel movements a day. She has been using Imodium when necessary. She does have a prescription for Lomotil if needed. Her CBC from yesterday is remarkable for a white blood count of 3.4 K. hemoglobin 11.6 and platelet count 250 6K.  Physical Examination: There were no vitals filed for this visit..  Weight: 95 lb 14.4 oz (43.5 kg). She is a 1 cm external hemorrhoid. There is minimal perianal dermatitis.  Impression: Tolerating radiation therapy well.  Plan: Continue radiation therapy as planned.

## 2011-09-15 ENCOUNTER — Ambulatory Visit
Admission: RE | Admit: 2011-09-15 | Discharge: 2011-09-15 | Disposition: A | Discharge status: Home / Self Care | Payer: BC Managed Care – PPO | Source: Ambulatory Visit | Attending: Radiation Oncology | Admitting: Radiation Oncology

## 2011-09-18 ENCOUNTER — Ambulatory Visit
Admission: RE | Admit: 2011-09-18 | Discharge: 2011-09-18 | Disposition: A | Discharge status: Home / Self Care | Payer: BC Managed Care – PPO | Source: Ambulatory Visit | Attending: Radiation Oncology | Admitting: Radiation Oncology

## 2011-09-18 ENCOUNTER — Telehealth: Payer: Self-pay | Admitting: *Deleted

## 2011-09-18 ENCOUNTER — Other Ambulatory Visit (HOSPITAL_BASED_OUTPATIENT_CLINIC_OR_DEPARTMENT_OTHER): Payer: BC Managed Care – PPO | Admitting: Lab

## 2011-09-18 ENCOUNTER — Ambulatory Visit (HOSPITAL_BASED_OUTPATIENT_CLINIC_OR_DEPARTMENT_OTHER): Payer: BC Managed Care – PPO | Admitting: Oncology

## 2011-09-18 VITALS — BP 118/73 | HR 69 | Temp 97.7°F | Ht <= 58 in | Wt 95.9 lb

## 2011-09-18 DIAGNOSIS — Z79899 Other long term (current) drug therapy: Secondary | ICD-10-CM

## 2011-09-18 DIAGNOSIS — K625 Hemorrhage of anus and rectum: Secondary | ICD-10-CM

## 2011-09-18 DIAGNOSIS — C20 Malignant neoplasm of rectum: Secondary | ICD-10-CM

## 2011-09-18 LAB — CBC WITH DIFFERENTIAL/PLATELET
BASO%: 0.2 % (ref 0.0–2.0)
EOS%: 12.1 % — ABNORMAL HIGH (ref 0.0–7.0)
HCT: 35.1 % (ref 34.8–46.6)
LYMPH%: 12.5 % — ABNORMAL LOW (ref 14.0–49.7)
MCH: 27.1 pg (ref 25.1–34.0)
MCHC: 33.3 g/dL (ref 31.5–36.0)
MCV: 81.2 fL (ref 79.5–101.0)
MONO#: 0.4 10*3/uL (ref 0.1–0.9)
MONO%: 10.5 % (ref 0.0–14.0)
NEUT%: 64.7 % (ref 38.4–76.8)
Platelets: 218 10*3/uL (ref 145–400)
RBC: 4.33 10*6/uL (ref 3.70–5.45)

## 2011-09-18 LAB — BASIC METABOLIC PANEL
CO2: 30 mEq/L (ref 19–32)
Calcium: 9.4 mg/dL (ref 8.4–10.5)
Creatinine, Ser: 0.63 mg/dL (ref 0.50–1.10)
Sodium: 139 mEq/L (ref 135–145)

## 2011-09-18 NOTE — Telephone Encounter (Signed)
gave patient appointment for 09-21-2012 printed out calendar and gave to the patient

## 2011-09-18 NOTE — Progress Notes (Signed)
OFFICE PROGRESS NOTE  CC: Renee Eva, MD Renee Herald, MD Renee Lint, MD Renee Nicholson., MD, MD [address] [place] [address] Renee Herb, MD  DIAGNOSIS: 46 year old female with new diagnosis of rectal carcinoma by rectal ultrasound she was found to have a T3 N1 lesion.  PRIOR THERAPY:   #1 patient presented with a several month history of diarrhea and then subsequent bleeding. She was found to be anemic with a hemoglobin of 10. Because of this she went on to have a colonoscopy performed. The colonoscopy showed multiple sessile polyps in the cecum ascending transverse and sigmoid colon as well as the rectum. The rectal area showed a large exophytic rectal mass approximately 1 cm above the dentate line measuring 3-4 cm. She had a biopsy of this performed and was found to be an adenocarcinoma consistent with a rectal primary.  #2 patient was seen by Dr. Carlynn Nicholson at [facility] who performed a rectal ultrasound. The staging clinically was T3 N1. It was recommended by Dr. Lennart Nicholson the patient undergo neoadjuvant chemotherapy and radiation higher to her definitive surgery.  #3 patient is currently scheduled to be seen by Dr. Chipper Nicholson in radiation oncology at [facility]. Her appointment originally was for today 08/22/2011. Unfortunately this was changed to 08/24/2011.  #4 patient has begun radiation therapy with concurrent xeloda at 750 mg BID  CURRENT THERAPY: Single agent xeloda given concurrently with radiation starting 08/22/2011   INTERVAL HISTORY: Renee Nicholson 45 y.o. female returns for followup visit today.patient is overall doing well she denies any fevers chills night sweats headaches shortness of breath chest pains palpitations she did have some diarrhea and has used Imodium for it and her diarrhea has resolved and in fact she became constipated. She is gong to be using the Imodium on an as-needed basis. She has not noticed any bleeding she has  not complaining of having hemorrhoids. She has no nausea or vomiting no hematuria hematochezia melena hemoptysis or hematemesis. She is eating small frequent meals and tolerating that quite well. She is tolerating the radiation very well. Remainder of the 10 point review of systems is negative. MEDICAL HISTORY: Past Medical History  Diagnosis Date  . Anemia   . Diarrhea   . Rectal cancer 08/16/2011  . Colon cancer   . Anxiety   . Bright red rectal bleeding 09/13/2011    ALLERGIES:   has no known allergies.  MEDICATIONS:  Current Outpatient Prescriptions  Medication Sig Dispense Refill  . Calcium Carb-Cholecalciferol (CALCIUM 500 +D) 500-400 MG-UNIT TABS Take by mouth.        . capecitabine (XELODA) 150 MG tablet Take by mouth 2 (two) times daily after a meal.        . capecitabine (XELODA) 500 MG tablet Take by mouth 2 (two) times daily after a meal.        . diphenoxylate-atropine (LOMOTIL) 2.5-0.025 MG per tablet Take 1 tablet by mouth 4 (four) times daily as needed. For stomach      . polysaccharide iron (NIFEREX) 150 MG CAPS capsule Take 150 mg by mouth daily.          SURGICAL HISTORY:  Past Surgical History  Procedure Date  . Tubal ligation   . Carpal tunnel release   . Colonoscopy 07/26/2011    Procedure: COLONOSCOPY;  Surgeon: Renee Harman, MD;  [facility];  Service: Endoscopy;  Laterality: N/A;  10:40  . Esophagogastroduodenoscopy 07/26/2011    Procedure: ESOPHAGOGASTRODUODENOSCOPY (  EGD);  Surgeon: Renee Harman, MD;  [facility];  Service: Endoscopy;  Laterality: N/A;    REVIEW OF SYSTEMS:  Pertinent items are noted in HPI.   PHYSICAL EXAMINATION: General appearance: alert, cooperative, appears stated age, mild distress and pale Resp: clear to auscultation bilaterally and normal percussion bilaterally Cardio: regular rate and rhythm, S1, S2 normal, no murmur, click, rub or gallop GI: soft, non-tender; bowel sounds normal; no masses,  no  organomegaly Extremities: extremities normal, atraumatic, no cyanosis or edema Neurologic: Alert and oriented X 3, normal strength and tone. Normal symmetric reflexes. Normal coordination and gait  ECOG PERFORMANCE STATUS: 0 - Asymptomatic  Blood pressure 118/73, pulse 69, temperature 97.7 F (36.5 C), height 4\' 5"  (1.346 m), weight 95 lb 14.4 oz (43.5 kg).  LABORATORY DATA: Lab Results  Component Value Date   WBC 3.8* 09/18/2011   HGB 11.7 09/18/2011   HCT 35.1 09/18/2011   MCV 81.2 09/18/2011   PLT 218 09/18/2011      Chemistry      Component Value Date/Time   NA 138 09/05/2011 1035   K 3.7 09/05/2011 1035   CL 102 09/05/2011 1035   CO2 27 09/05/2011 1035   BUN 11 09/05/2011 1035   CREATININE 0.49* 09/05/2011 1035   CREATININE 0.70 07/27/2011 1306      Component Value Date/Time   CALCIUM 8.8 09/05/2011 1035   ALKPHOS 62 08/16/2011 1122   AST 19 08/16/2011 1122   ALT 9 08/16/2011 1122   BILITOT 0.3 08/16/2011 1122       RADIOGRAPHIC STUDIES:  Nm Pet Image Initial (pi) Skull Base To Thigh 1.  Anorectal primary without evidence of distant metastasis. 2.  Perirectal/peritoneal nodes described on the prior diagnostic CT are less impressive today and may have undergone response to therapy.  Based on location, these were suspicious on the prior. 3.  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 Neoplasm cannot be excluded.  Consider further evaluation with ultrasound. 4.  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  Consider correlation with cardiac risk factors.  5.  Sinus disease.  Original Report Authenticated By: Consuello Bossier, M.D.     ASSESSMENT: 45 year old female with stage III adenocarcinoma of the rectum. Patient has now begun concurrent chemoradiation therapy with chemotherapy consisting of Xeloda. She is tolerating her treatments well.  PLAN:   Patient will continue the radiation and Xeloda overall  she is tolerating it quite well. I will continue to see her on a weekly basis with weekly CBC and chemistries. She will be seen back again on 09/25/2011.   All questions were answered. The patient knows to call the clinic with any problems, questions or concerns. We can certainly see the patient much sooner if necessary.  I spent 20 minutes counseling the patient face to face. The total time spent in the appointment was 30 minutes.    Drue Second, MD Medical/Oncology [facility] [phone] (beeper) [phone] (Office)  09/18/2011, 11:48 AM

## 2011-09-20 ENCOUNTER — Encounter: Payer: Self-pay | Admitting: Radiation Oncology

## 2011-09-20 ENCOUNTER — Ambulatory Visit
Admission: RE | Admit: 2011-09-20 | Discharge: 2011-09-20 | Disposition: A | Discharge status: Home / Self Care | Payer: BC Managed Care – PPO | Source: Ambulatory Visit | Attending: Radiation Oncology | Admitting: Radiation Oncology

## 2011-09-20 DIAGNOSIS — C20 Malignant neoplasm of rectum: Secondary | ICD-10-CM

## 2011-09-20 MED ORDER — OXYCODONE-ACETAMINOPHEN 5-325 MG PO TABS: 1.0000 | ORAL_TABLET | ORAL | Status: AC | PRN

## 2011-09-20 NOTE — Progress Notes (Signed)
Patient presents to the clinic today accompanied by her daughter requesting to be seen by a physician. Patient has already received XRT for today. Patient is alert and oriented to person, place, and time. No distress noted. Steady gait noted. Patient complains of burning and itching of her rectum. Patient reports constant burning rectal pain 7 on a scale of 0-10 unrelieved with sitz baths. Patient denies taking any pain medication because "she is on chemo." Patient denies having diarrhea. Patient's daughter reports she takes Lomotil regularly. Reported all findings to Dr. Michell Heinrich.

## 2011-09-20 NOTE — Progress Notes (Signed)
Weekly Management Note Current Dose:28.8 Gy  Projected Dose: 50.4 Gy   Narrative:  The patient presents for routine under treatment assessment.  CBCT/MVCT images/Port film x-rays were reviewed.  The chart was checked. C/o rectal irritation which is new. Kept her awake last night. Using sitz bath. Not taking pain meds. Using flushable wipes.  Also had 7-8 bm yesterday. Taking immodium now.   Physical Findings:  Unchanged. Alert and oriented. Limited english. Daughter translates. Appears comfortable.   Weight:  Wt Readings from Last 3 Encounters:  09/20/11 95 lb 11.2 oz (43.409 kg)  09/18/11 95 lb 14.4 oz (43.5 kg)  09/14/11 95 lb 14.4 oz (43.5 kg)   Lab Results  Component Value Date   WBC 3.8* 09/18/2011   HGB 11.7 09/18/2011   HCT 35.1 09/18/2011   MCV 81.2 09/18/2011   PLT 218 09/18/2011   Lab Results  Component Value Date   CREATININE 0.63 09/18/2011   BUN 8 09/18/2011   NA 139 09/18/2011   K 4.0 09/18/2011   CL 100 09/18/2011   CO2 30 09/18/2011     Impression:  The patient is tolerating radiation.  Plan:  Continue treatment as planned. Add percocet at night 5/325. Discussed natural history of SE. Will use immodium with goal of 1-2 soft BM per day.

## 2011-09-21 ENCOUNTER — Ambulatory Visit
Admission: RE | Admit: 2011-09-21 | Discharge: 2011-09-21 | Disposition: A | Discharge status: Home / Self Care | Payer: BC Managed Care – PPO | Source: Ambulatory Visit | Attending: Radiation Oncology | Admitting: Radiation Oncology

## 2011-09-22 ENCOUNTER — Encounter: Payer: Self-pay | Admitting: *Deleted

## 2011-09-22 ENCOUNTER — Ambulatory Visit: Payer: BC Managed Care – PPO

## 2011-09-22 ENCOUNTER — Ambulatory Visit
Admission: RE | Admit: 2011-09-22 | Discharge: 2011-09-22 | Disposition: A | Discharge status: Home / Self Care | Payer: BC Managed Care – PPO | Source: Ambulatory Visit | Attending: Radiation Oncology | Admitting: Radiation Oncology

## 2011-09-22 NOTE — Progress Notes (Signed)
Blood drawn for APC/MYH testing and sent to [facility]. TAT ~2 weeks. 

## 2011-09-22 NOTE — Progress Notes (Signed)
Confirmation from [facility] that RX has been shipped on 09/21/11

## 2011-09-25 ENCOUNTER — Ambulatory Visit
Admission: RE | Admit: 2011-09-25 | Discharge: 2011-09-25 | Disposition: A | Discharge status: Home / Self Care | Payer: BC Managed Care – PPO | Source: Ambulatory Visit | Attending: Radiation Oncology | Admitting: Radiation Oncology

## 2011-09-25 ENCOUNTER — Encounter: Payer: Self-pay | Admitting: Radiation Oncology

## 2011-09-25 ENCOUNTER — Other Ambulatory Visit (HOSPITAL_BASED_OUTPATIENT_CLINIC_OR_DEPARTMENT_OTHER): Payer: BC Managed Care – PPO | Admitting: Lab

## 2011-09-25 ENCOUNTER — Ambulatory Visit (HOSPITAL_BASED_OUTPATIENT_CLINIC_OR_DEPARTMENT_OTHER): Payer: BC Managed Care – PPO | Admitting: Oncology

## 2011-09-25 VITALS — Wt 95.7 lb

## 2011-09-25 VITALS — BP 114/77 | HR 66 | Temp 98.4°F | Ht <= 58 in | Wt 94.7 lb

## 2011-09-25 DIAGNOSIS — Z09 Encounter for follow-up examination after completed treatment for conditions other than malignant neoplasm: Secondary | ICD-10-CM

## 2011-09-25 DIAGNOSIS — C20 Malignant neoplasm of rectum: Secondary | ICD-10-CM

## 2011-09-25 LAB — CBC WITH DIFFERENTIAL/PLATELET
BASO%: 0.5 % (ref 0.0–2.0)
Basophils Absolute: 0 10*3/uL (ref 0.0–0.1)
EOS%: 11.7 % — ABNORMAL HIGH (ref 0.0–7.0)
HGB: 11.1 g/dL — ABNORMAL LOW (ref 11.6–15.9)
MCH: 28.2 pg (ref 25.1–34.0)
MCHC: 34.1 g/dL (ref 31.5–36.0)
MONO#: 0.5 10*3/uL (ref 0.1–0.9)
RDW: 25.1 % — ABNORMAL HIGH (ref 11.2–14.5)
WBC: 4 10*3/uL (ref 3.9–10.3)
lymph#: 0.3 10*3/uL — ABNORMAL LOW (ref 0.9–3.3)

## 2011-09-25 LAB — COMPREHENSIVE METABOLIC PANEL
ALT: 9 U/L (ref 0–35)
AST: 17 U/L (ref 0–37)
Albumin: 3.7 g/dL (ref 3.5–5.2)
CO2: 30 mEq/L (ref 19–32)
Calcium: 9.2 mg/dL (ref 8.4–10.5)
Chloride: 101 mEq/L (ref 96–112)
Potassium: 4.3 mEq/L (ref 3.5–5.3)

## 2011-09-25 NOTE — Progress Notes (Signed)
OFFICE PROGRESS NOTE  CC: Renee Eva, MD [name], MD [name], MD [name], MD, MD [address] [place] [address] [name], MD  DIAGNOSIS: 45 year old female with new diagnosis of rectal carcinoma by rectal ultrasound she was found to have a T3 N1 lesion.  PRIOR THERAPY:   #1 patient presented with a several month history of diarrhea and then subsequent bleeding. She was found to be anemic with a hemoglobin of 10. Because of this she went on to have a colonoscopy performed. The colonoscopy showed multiple sessile polyps in the cecum ascending transverse and sigmoid colon as well as the rectum. The rectal area showed a large exophytic rectal mass approximately 1 cm above the dentate line measuring 3-4 cm. She had a biopsy of this performed and was found to be an adenocarcinoma consistent with a rectal primary.  #2 patient was seen by Dr. [name] at [facility] who performed a rectal ultrasound. The staging clinically was T3 N1. It was recommended by Dr. [name] the patient undergo neoadjuvant chemotherapy and radiation higher to her definitive surgery.  #3 patient is currently scheduled to be seen by Dr. [name] in radiation oncology at [facility]. Her appointment originally was for today 08/22/2011. Unfortunately this was changed to 08/24/2011.  #4 patient has begun radiation therapy with concurrent xeloda at 750 mg BID  CURRENT THERAPY: Single agent xeloda given concurrently with radiation starting 08/22/2011   INTERVAL HISTORY: Renee Nicholson 45 y.o. female returns for followup visit today.patient is overall doing well she denies any fevers chills night sweats headaches shortness of breath chest pains palpitations she did have some diarrhea and has used Imodium for it and her diarrhea has resolved and in fact she became constipated. She is gong to be using the Imodium on an as-needed basis. She has not noticed any bleeding she has  not complaining of having hemorrhoids. She has no nausea or vomiting no hematuria hematochezia melena hemoptysis or hematemesis. She is eating small frequent meals and tolerating that quite well. She is tolerating the radiation very well. Remainder of the 10 point review of systems is negative. MEDICAL HISTORY: Past Medical History  Diagnosis Date  . Anemia   . Diarrhea   . Rectal cancer 08/16/2011  . Colon cancer   . Anxiety   . Bright red rectal bleeding 09/13/2011    ALLERGIES:   has no known allergies.  MEDICATIONS:  Current Outpatient Prescriptions  Medication Sig Dispense Refill  . Calcium Carb-Cholecalciferol (CALCIUM 500 +D) 500-400 MG-UNIT TABS Take by mouth.        . capecitabine (XELODA) 150 MG tablet Take by mouth 2 (two) times daily after a meal.        . capecitabine (XELODA) 500 MG tablet Take by mouth 2 (two) times daily after a meal.        . diphenoxylate-atropine (LOMOTIL) 2.5-0.025 MG per tablet Take 1 tablet by mouth 4 (four) times daily as needed. For stomach      . oxyCODONE-acetaminophen (PERCOCET) 5-325 MG per tablet Take 1 tablet by mouth every 4 (four) hours as needed for pain.  60 tablet  0  . polysaccharide iron (NIFEREX) 150 MG CAPS capsule Take 150 mg by mouth daily.          SURGICAL HISTORY:  Past Surgical History  Procedure Date  . Tubal ligation   . Carpal tunnel release   . Colonoscopy 07/26/2011    Procedure: COLONOSCOPY;  Surgeon:  Arlyce Harman, MD;  [facility];  Service: Endoscopy;  Laterality: N/A;  10:40  . Esophagogastroduodenoscopy 07/26/2011    Procedure: ESOPHAGOGASTRODUODENOSCOPY (EGD);  Surgeon: Arlyce Harman, MD;  [facility];  Service: Endoscopy;  Laterality: N/A;    REVIEW OF SYSTEMS:  Pertinent items are noted in HPI.   PHYSICAL EXAMINATION: General appearance: alert, cooperative, appears stated age, mild distress and pale Resp: clear to auscultation bilaterally and normal percussion  bilaterally Cardio: regular rate and rhythm, S1, S2 normal, no murmur, click, rub or gallop GI: soft, non-tender; bowel sounds normal; no masses,  no organomegaly Extremities: extremities normal, atraumatic, no cyanosis or edema Neurologic: Alert and oriented X 3, normal strength and tone. Normal symmetric reflexes. Normal coordination and gait  ECOG PERFORMANCE STATUS: 0 - Asymptomatic  Blood pressure 114/77, pulse 66, temperature 98.4 F (36.9 C), temperature source Oral, height 4\' 5"  (1.346 m), weight 94 lb 11.2 oz (42.956 kg).  LABORATORY DATA: Lab Results  Component Value Date   WBC 4.0 09/25/2011   HGB 11.1* 09/25/2011   HCT 32.5* 09/25/2011   MCV 82.6 09/25/2011   PLT 170 09/25/2011      Chemistry      Component Value Date/Time   NA 139 09/18/2011 0950   K 4.0 09/18/2011 0950   CL 100 09/18/2011 0950   CO2 30 09/18/2011 0950   BUN 8 09/18/2011 0950   CREATININE 0.63 09/18/2011 0950   CREATININE 0.70 07/27/2011 1306      Component Value Date/Time   CALCIUM 9.4 09/18/2011 0950   ALKPHOS 62 08/16/2011 1122   AST 19 08/16/2011 1122   ALT 9 08/16/2011 1122   BILITOT 0.3 08/16/2011 1122       RADIOGRAPHIC STUDIES:  Nm Pet Image Initial (pi) Skull Base To Thigh 1.  Anorectal primary without evidence of distant metastasis. 2.  Perirectal/peritoneal nodes described on the prior diagnostic CT are less impressive today and may have undergone response to therapy.  Based on location, these were suspicious on the prior. 3.  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 Neoplasm cannot be excluded.  Consider further evaluation with ultrasound. 4.  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  Consider correlation with cardiac risk factors.  5.  Sinus disease.  Original Report Authenticated By: Consuello Bossier, M.D.     ASSESSMENT: 45 year old female with stage III adenocarcinoma of the rectum. Patient is receiving  concurrent chemoradiation therapy with chemotherapy consisting of Xeloda. Overall She is tolerating her treatments well. She is beginning week 5 of radiation and xeloda.  PLAN:   Patient will continue the radiation and Xeloda overall she is tolerating it quite well. I will continue to see her on a weekly basis with weekly CBC and chemistries. She will be seen back again on 10/02/11   All questions were answered. The patient knows to call the clinic with any problems, questions or concerns. We can certainly see the patient much sooner if necessary.  I spent 20 minutes counseling the patient face to face. The total time spent in the appointment was 30 minutes.    Drue Second, MD Medical/Oncology [facility] [phone] (beeper) [phone] (Office)  09/25/2011, 12:47 PM

## 2011-09-25 NOTE — Progress Notes (Signed)
NO C/O PAIN TODAY.  SAID DR. Michell Heinrich GAVE MED LAST WEEK FOR PAIN IN RECTAL AREA

## 2011-09-25 NOTE — Progress Notes (Signed)
Simulation Note:  The patient underwent reduced rectal boost field. She was setup PA, right lateral, and left lateral fields. 3 separate multileaf collimators were designed to conform the field. I took a 2 cm margin around her tumor. I prescribing a further 540 cGy in 3 sessions. An isodose plan is requested.

## 2011-09-25 NOTE — Progress Notes (Signed)
Weekly Management Note:  Site:Pelvis/Rectum Current Dose:  3420  cGy Projected Dose: 5040  cGy  Narrative: The patient is seen today for routine under treatment assessment. CBCT/MVCT images/port films were reviewed. The chart was reviewed.   Her perianal/rectal pain is improved with oxycodone when necessary. Her blood counts and chemistries from last week are satisfactory. She states that she is having only 3-4 bowel movements a day and she no longer uses Imodium. She continues with her Xeloda. Her weight remained stable.  Physical Examination: There were no vitals filed for this visit..  Weight: 95 lb 11.2 oz (43.409 kg). No change  Impression: Tolerating radiation therapy well.  Plan: Continue radiation therapy as planned.

## 2011-09-27 ENCOUNTER — Ambulatory Visit
Admission: RE | Admit: 2011-09-27 | Discharge: 2011-09-27 | Disposition: A | Discharge status: Home / Self Care | Payer: BC Managed Care – PPO | Source: Ambulatory Visit | Attending: Radiation Oncology | Admitting: Radiation Oncology

## 2011-09-28 ENCOUNTER — Ambulatory Visit
Admission: RE | Admit: 2011-09-28 | Discharge: 2011-09-28 | Disposition: A | Discharge status: Home / Self Care | Payer: BC Managed Care – PPO | Source: Ambulatory Visit | Attending: Radiation Oncology | Admitting: Radiation Oncology

## 2011-09-29 ENCOUNTER — Telehealth: Payer: Self-pay | Admitting: Oncology

## 2011-09-29 ENCOUNTER — Ambulatory Visit
Admission: RE | Admit: 2011-09-29 | Discharge: 2011-09-29 | Disposition: A | Discharge status: Home / Self Care | Payer: BC Managed Care – PPO | Source: Ambulatory Visit | Attending: Radiation Oncology | Admitting: Radiation Oncology

## 2011-09-29 ENCOUNTER — Other Ambulatory Visit: Payer: BC Managed Care – PPO | Admitting: Lab

## 2011-09-29 ENCOUNTER — Ambulatory Visit (HOSPITAL_BASED_OUTPATIENT_CLINIC_OR_DEPARTMENT_OTHER): Payer: BC Managed Care – PPO | Admitting: Oncology

## 2011-09-29 DIAGNOSIS — C20 Malignant neoplasm of rectum: Secondary | ICD-10-CM

## 2011-09-29 LAB — CBC WITH DIFFERENTIAL/PLATELET
BASO%: 0.4 % (ref 0.0–2.0)
EOS%: 7 % (ref 0.0–7.0)
MCH: 28.7 pg (ref 25.1–34.0)
MCHC: 34.2 g/dL (ref 31.5–36.0)
RBC: 4.02 10*6/uL (ref 3.70–5.45)
RDW: 26.5 % — ABNORMAL HIGH (ref 11.2–14.5)
lymph#: 0.4 10*3/uL — ABNORMAL LOW (ref 0.9–3.3)

## 2011-09-29 LAB — COMPREHENSIVE METABOLIC PANEL
ALT: 9 U/L (ref 0–35)
AST: 18 U/L (ref 0–37)
Albumin: 3.8 g/dL (ref 3.5–5.2)
Calcium: 9.2 mg/dL (ref 8.4–10.5)
Chloride: 100 mEq/L (ref 96–112)
Creatinine, Ser: 0.68 mg/dL (ref 0.50–1.10)
Potassium: 4.1 mEq/L (ref 3.5–5.3)
Sodium: 138 mEq/L (ref 135–145)

## 2011-09-29 NOTE — Telephone Encounter (Signed)
Per dr Welton Flakes to disregard the in basket message that was sent over today on 09/29/2011. Will delete it from the inbasket

## 2011-10-02 ENCOUNTER — Encounter (INDEPENDENT_AMBULATORY_CARE_PROVIDER_SITE_OTHER): Payer: Self-pay | Admitting: General Surgery

## 2011-10-02 ENCOUNTER — Ambulatory Visit
Admission: RE | Admit: 2011-10-02 | Discharge: 2011-10-02 | Disposition: A | Discharge status: Home / Self Care | Payer: BC Managed Care – PPO | Source: Ambulatory Visit | Attending: Radiation Oncology | Admitting: Radiation Oncology

## 2011-10-02 ENCOUNTER — Encounter: Payer: Self-pay | Admitting: Radiation Oncology

## 2011-10-02 ENCOUNTER — Ambulatory Visit (INDEPENDENT_AMBULATORY_CARE_PROVIDER_SITE_OTHER): Payer: BC Managed Care – PPO | Admitting: General Surgery

## 2011-10-02 VITALS — BP 123/72 | HR 77 | Resp 18 | Wt 93.9 lb

## 2011-10-02 DIAGNOSIS — C20 Malignant neoplasm of rectum: Secondary | ICD-10-CM

## 2011-10-02 DIAGNOSIS — E079 Disorder of thyroid, unspecified: Secondary | ICD-10-CM

## 2011-10-02 NOTE — Assessment & Plan Note (Addendum)
Get genetic testing results back. Follow up in 3-4 weeks.   Present at GI tumor board. ? TAC with pouch or proctectomy with coloanal anastamosis Pt had 22 polyps at colonoscopy that were removed .   Will be difficult to save sphincters due to location at 1 cm above dentate line.   Reviewed that pt will definitely have either a temporary diverting loop ileostomy or a permanent end colostomy.     Also discussed that sometimes we are able to perform coloanal connection, but sometimes the sphincters do not work well after surgery.   Decision to perform LAR vs APR to be determined at time of surgery.

## 2011-10-02 NOTE — Progress Notes (Signed)
Patient presented to the clinic today accompanied by her husband and daughter for under treat visit with Dr. Dayton Scrape. Patient is alert and oriented to person, place, and time. No distress noted. Steady gait noted. Pleasant affect noted. Patient denies pain at this time. Patient reports that OTC gas x provides relief. Patient reports marked fatigue.  Patient reports that on average she has 3-4 firm bowel movements per day. Patient reports a "little bit" of pain with BM. Patient denies seeing blood in her stool. Patient's husband reports her appetite is decreased. No weight loss noted. All findings reported to Dr. Dayton Scrape.

## 2011-10-02 NOTE — Assessment & Plan Note (Signed)
Will need neck ultrasound

## 2011-10-02 NOTE — Progress Notes (Signed)
Weekly Management Note:  Site:Pelvis/rectum  Current Dose:  4140  cGy Projected Dose: 5040  cGy  Narrative: The patient is seen today for routine under treatment assessment. CBCT/MVCT images/port films were reviewed. The chart was reviewed.   She is doing well from a GU and GI standpoint. No diarrhea. Her blood counts remain satisfactory. She saw Dr. Donell Beers this morning and she'll see Dr. Welton Flakes again on Thursday. She continues with her Xeloda. She has not had a flu shot.  Physical Examination:  Filed Vitals:   10/02/11 1138  BP: 123/72  Pulse: 77  Resp: 18  .  Weight: 93 lb 14.4 oz (42.593 kg). Rectal examination not performed today.  Impression: Tolerating radiation therapy well.  Plan: Continue radiation therapy as planned. She will finish her radiation therapy next Monday. She'll see Dr. Donell Beers for a followup visit on January 28. She'll check with Dr. Welton Flakes to see if she can have a flu shot later this week.

## 2011-10-02 NOTE — Progress Notes (Signed)
OFFICE PROGRESS NOTE  CC: Renee Eva, MD Renee Herald, MD Renee Lint, MD Renee Nicholson., MD, MD [address] [place] [address] [name], MD  DIAGNOSIS: 46 year old female with new diagnosis of rectal carcinoma by rectal ultrasound she was found to have a T3 N1 lesion.  PRIOR THERAPY:   #1 patient presented with a several month history of diarrhea and then subsequent bleeding. She was found to be anemic with a hemoglobin of 10. Because of this she went on to have a colonoscopy performed. The colonoscopy showed multiple sessile polyps in the cecum ascending transverse and sigmoid colon as well as the rectum. The rectal area showed a large exophytic rectal mass approximately 1 cm above the dentate line measuring 3-4 cm. She had a biopsy of this performed and was found to be an adenocarcinoma consistent with a rectal primary.  #2 patient was seen by Dr. Carlynn Nicholson at [facility] who performed a rectal ultrasound. The staging clinically was T3 N1. It was recommended by Dr. [name] the patient undergo neoadjuvant chemotherapy and radiation higher to her definitive surgery.  #3 patient is currently scheduled to be seen by Dr. [name] in radiation oncology at [facility]. Her appointment originally was for today 08/22/2011. Unfortunately this was changed to 08/24/2011.  #4 patient has begun radiation therapy with concurrent xeloda at 750 mg BID  CURRENT THERAPY: Single agent xeloda given concurrently with radiation starting 08/22/2011   INTERVAL HISTORY: Renee Nicholson 45 y.o. female returns for followup visit today.patient is overall doing well she denies any fevers chills night sweats headaches shortness of breath chest pains palpitations she did have some diarrhea and has used Imodium for it and her diarrhea has resolved and in fact she became constipated. She is gong to be using the Imodium on an as-needed basis. She has not noticed any bleeding she has  not complaining of having hemorrhoids. She has no nausea or vomiting no hematuria hematochezia melena hemoptysis or hematemesis. She is eating small frequent meals and tolerating that quite well. She is tolerating the radiation very well. Remainder of the 10 point review of systems is negative. MEDICAL HISTORY: Past Medical History  Diagnosis Date  . Anemia   . Diarrhea   . Rectal cancer 08/16/2011  . Colon cancer   . Anxiety   . Bright red rectal bleeding 09/13/2011    ALLERGIES:   has no known allergies.  MEDICATIONS:  Current Outpatient Prescriptions  Medication Sig Dispense Refill  . Calcium Carb-Cholecalciferol (CALCIUM 500 +D) 500-400 MG-UNIT TABS Take by mouth.        . capecitabine (XELODA) 150 MG tablet Take by mouth 2 (two) times daily after a meal.        . capecitabine (XELODA) 500 MG tablet Take by mouth 2 (two) times daily after a meal.        . diphenoxylate-atropine (LOMOTIL) 2.5-0.025 MG per tablet Take 1 tablet by mouth 4 (four) times daily as needed. For stomach      . polysaccharide iron (NIFEREX) 150 MG CAPS capsule Take 150 mg by mouth daily.        . simethicone (MYLICON) 80 MG chewable tablet Chew 80 mg by mouth every 6 (six) hours as needed.          SURGICAL HISTORY:  Past Surgical History  Procedure Date  . Tubal ligation   . Carpal tunnel release   . Colonoscopy 07/26/2011    Procedure: COLONOSCOPY;  Surgeon: Renee Nicholson  Renee Salter, MD;  [facility];  Service: Endoscopy;  Laterality: N/A;  10:40  . Esophagogastroduodenoscopy 07/26/2011    Procedure: ESOPHAGOGASTRODUODENOSCOPY (EGD);  Surgeon: Renee Harman, MD;  Location: [facility];  Service: Endoscopy;  Laterality: N/A;    REVIEW OF SYSTEMS:  Pertinent items are noted in HPI.   PHYSICAL EXAMINATION: General appearance: alert, cooperative, appears stated age, mild distress and pale Resp: clear to auscultation bilaterally and normal percussion bilaterally Cardio: regular rate and rhythm, S1,  S2 normal, no murmur, click, rub or gallop GI: soft, non-tender; bowel sounds normal; no masses,  no organomegaly Extremities: extremities normal, atraumatic, no cyanosis or edema Neurologic: Alert and oriented X 3, normal strength and tone. Normal symmetric reflexes. Normal coordination and gait  ECOG PERFORMANCE STATUS: 0 - Asymptomatic  Blood pressure 112/70, pulse 71, temperature 97.6 F (36.4 C), height 4\' 5"  (1.346 m), weight 93 lb 6.4 oz (42.366 kg).  LABORATORY DATA: Lab Results  Component Value Date   WBC 3.5* 09/29/2011   HGB 11.5* 09/29/2011   HCT 33.7* 09/29/2011   MCV 84.0 09/29/2011   PLT 193 09/29/2011      Chemistry      Component Value Date/Time   NA 138 09/29/2011 1215   K 4.1 09/29/2011 1215   CL 100 09/29/2011 1215   CO2 29 09/29/2011 1215   BUN 12 09/29/2011 1215   CREATININE 0.68 09/29/2011 1215   CREATININE 0.70 07/27/2011 1306      Component Value Date/Time   CALCIUM 9.2 09/29/2011 1215   ALKPHOS 54 09/29/2011 1215   AST 18 09/29/2011 1215   ALT 9 09/29/2011 1215   BILITOT 0.5 09/29/2011 1215       RADIOGRAPHIC STUDIES:  Nm Pet Image Initial (pi) Skull Base To Thigh 1.  Anorectal primary without evidence of distant metastasis. 2.  Perirectal/peritoneal nodes described on the prior diagnostic CT are less impressive today and may have undergone response to therapy.  Based on location, these were suspicious on the prior. 3.  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 Neoplasm cannot be excluded.  Consider further evaluation with ultrasound. 4.  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  Consider correlation with cardiac risk factors.  5.  Sinus disease.  Original Report Authenticated By: Renee Nicholson, M.D.     ASSESSMENT: 46 year old female with stage III adenocarcinoma of the rectum. Patient is receiving concurrent chemoradiation therapy with chemotherapy consisting of Xeloda. Overall She is tolerating her  treatments well. She is beginning week 5 of radiation and xeloda.  PLAN:   Patient will continue the radiation and Xeloda overall she is tolerating it quite well. I will continue to see her on a weekly basis with weekly CBC and chemistries. She will be seen back again on 10/05/11   All questions were answered. The patient knows to call the clinic with any problems, questions or concerns. We can certainly see the patient much sooner if necessary.  I spent 20 minutes counseling the patient face to face. The total time spent in the appointment was 30 minutes.    Drue Second, MD Medical/Oncology [facility] [phone] (beeper) [phone] (Office)  10/02/2011, 1:14 PM

## 2011-10-02 NOTE — Progress Notes (Signed)
Chief Complaint  Patient presents with  . Colon Cancer    HISTORY: Pt presented with bloody diarrhea in October of 2012.  On colonoscopy, she was found to have 22 polyps and a large rectal mass.  Workup was positive for uT3N1 disease, so she is undergoing neoadjuvant chemoradiation.  She saw Dr. Epifania Gore at [facility] to discuss surgery, as she has a family member who had surgery with him.  Since she lives in [place], she has been undergoing treatment here, and it was suggested that she stay in the hospital system to have surgery.    Her diarrhea has completely improved with radiation. She has had some burning in the perianal region with a radiation.  She has been tolerating the treatment well other than fatigue.  She has not had to delay any therapies.  She is slightly anemic.    Past Medical History  Diagnosis Date  . Anemia   . Diarrhea   . Rectal cancer 08/16/2011  . Colon cancer   . Anxiety   . Bright red rectal bleeding 09/13/2011    Past Surgical History  Procedure Date  . Tubal ligation   . Carpal tunnel release   . Colonoscopy 07/26/2011    Procedure: COLONOSCOPY;  Surgeon: Arlyce Harman, MD;  [facility];  Service: Endoscopy;  Laterality: N/A;  10:40  . Esophagogastroduodenoscopy 07/26/2011    Procedure: ESOPHAGOGASTRODUODENOSCOPY (EGD);  Surgeon: Arlyce Harman, MD;  [facility];  Service: Endoscopy;  Laterality: N/A;    Current Outpatient Prescriptions  Medication Sig Dispense Refill  . Calcium Carb-Cholecalciferol (CALCIUM 500 +D) 500-400 MG-UNIT TABS Take by mouth.        . capecitabine (XELODA) 150 MG tablet Take by mouth 2 (two) times daily after a meal.        . capecitabine (XELODA) 500 MG tablet Take by mouth 2 (two) times daily after a meal.        . diphenoxylate-atropine (LOMOTIL) 2.5-0.025 MG per tablet Take 1 tablet by mouth 4 (four) times daily as needed. For stomach      . polysaccharide iron (NIFEREX) 150 MG CAPS capsule Take 150 mg by  mouth daily.        . simethicone (MYLICON) 80 MG chewable tablet Chew 80 mg by mouth every 6 (six) hours as needed.           No Known Allergies   Family History  Problem Relation Age of Onset  . Diabetes Mother   . Colon cancer Neg Hx   . Cancer Brother 35    rectal cancer lives in [place]     History   Social History  . Marital Status: Married    Spouse Name: N/A    Number of Children: 2  . Years of Education: N/A   Occupational History  . self employed, [employer]    Social History Main Topics  . Smoking status: Never Smoker   . Smokeless tobacco: None  . Alcohol Use: No  . Drug Use: No  . Sexually Active: Yes    REVIEW OF SYSTEMS - PERTINENT POSITIVES ONLY: 12 point review of systems negative other than HPI and PMH except for fatigue.    EXAM: Filed Vitals:   10/02/11 0858  BP: 102/78  Pulse: 66  Temp: 98.4 F (36.9 C)  Resp: 16    Gen:  No acute distress.  Well nourished and well groomed.   Neurological: Alert and oriented to person, place, and time. Coordination  normal.  Head: Normocephalic and atraumatic.  Eyes: Conjunctivae are normal. Pupils are equal, round, and reactive to light. No scleral icterus.  Neck: Normal range of motion. Neck supple. No tracheal deviation or thyromegaly present.  Cardiovascular: Normal rate, regular rhythm, normal heart sounds and intact distal pulses.  Exam reveals no gallop and no friction rub.  No murmur heard. Respiratory: Effort normal.  No respiratory distress. No chest wall tenderness. Breath sounds normal.  No wheezes, rales or rhonchi.  GI: Soft. Bowel sounds are normal. The abdomen is soft and nontender.  There is no rebound and no guarding.  Rectal exam:  Mass posterior, around 1.5 cm above dentate line.  Limited exam due to discomfort.   Musculoskeletal: Normal range of motion. Extremities are nontender.  Lymphadenopathy: No cervical, preauricular, postauricular or axillary adenopathy is present Skin: Skin is  warm and dry. No rash noted. No diaphoresis. No erythema. No pallor. No clubbing, cyanosis, or edema.   Psychiatric: Normal mood and affect. Behavior is normal. Judgment and thought content normal.  Pt with english as second language.    LABORATORY RESULTS: Hgb 11.5, plts 193k, CMET essentially normal.  Pathology 1. Colon, polyp(s), ascending - TUBULAR ADENOMAS. NO HIGH GRADE DYSPLASIA OR MALIGNANCY IDENTIFIED. 2. Colon, polyp(s), transverse - TUBULAR ADENOMAS. NO HIGH GRADE DYSPLASIA OR MALIGNANCY IDENTIFIED. 3. Colon, polyp(s), sigmoid - TUBULAR ADENOMAS. NO HIGH GRADE DYSPLASIA OR MALIGNANCY IDENTIFIED. 4. Rectum, polyp(s) - TUBULAR ADENOMAS. NO HIGH GRADE DYSPLASIA OR MALIGNANCY IDENTIFIED. 5. Rectum, biopsy, and polyps - ADENOCARCINOMA. 6. Stomach, biopsy - BENIGN FUNDIC GLAND POLYPS. NO HELICOBACTER PYLORI, DYSPLASIA, ADENOMATOUS CHANGE OR MALIGNANCY      RADIOLOGY RESULTS: PET scan:    IMPRESSION:  1. Anorectal primary without evidence of distant metastasis.  2. Perirectal/peritoneal nodes described on the prior diagnostic  CT are less impressive today and may have undergone response to  therapy. Based on location, these were suspicious on the prior.  3. 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  Neoplasm cannot be excluded. Consider further evaluation with  ultrasound.  4. Suspicion of age advanced coronary artery atherosclerosis in  the right coronary artery. Consider correlation with cardiac risk  factors.  5. Sinus disease.   ASSESSMENT AND PLAN: Rectal cancer Get genetic testing results back. Follow up in 3-4 weeks.   Present at GI tumor board. ? TAC with pouch or proctectomy with coloanal anastamosis Pt had 22 polyps at colonoscopy that were removed .   Will be difficult to save sphincters due to location at 1 cm above dentate line.   Reviewed that pt will definitely have either a temporary diverting loop ileostomy or a permanent end  colostomy.     Also discussed that sometimes we are able to perform coloanal connection, but sometimes the sphincters do not work well after surgery.   Decision to perform LAR vs APR to be determined at time of surgery.     Thyroid mass: Follow up with neck ultrasound to eval PET findings.     Maudry Diego MD Surgical Oncology, General and Endocrine Surgery [facility]      Visit Diagnoses: 1. Rectal cancer     Primary Care Physician: [name], MD, MD

## 2011-10-02 NOTE — Patient Instructions (Signed)
Get genetic testing results.  We will present case at GI conference.  I will reexamine you after radiation complete.    Follow up with me in 3-4 weeks.

## 2011-10-03 ENCOUNTER — Ambulatory Visit
Admission: RE | Admit: 2011-10-03 | Discharge: 2011-10-03 | Disposition: A | Discharge status: Home / Self Care | Payer: BC Managed Care – PPO | Source: Ambulatory Visit | Attending: Radiation Oncology | Admitting: Radiation Oncology

## 2011-10-04 ENCOUNTER — Telehealth: Payer: Self-pay | Admitting: *Deleted

## 2011-10-04 ENCOUNTER — Ambulatory Visit
Admission: RE | Admit: 2011-10-04 | Discharge: 2011-10-04 | Disposition: A | Discharge status: Home / Self Care | Payer: BC Managed Care – PPO | Source: Ambulatory Visit | Attending: Radiation Oncology | Admitting: Radiation Oncology

## 2011-10-04 NOTE — Telephone Encounter (Signed)
Received call from pathology requesting peripheral blood draw for MSI testing secondary to not enough tissue being available in pathology to do the MSI testing on.  Request made to [facility] in [facility] lab to add this on to the labs ordered for 10/05/11 12:00 lab appointment already in place.  Beverly to ensure the EDTA tubes X2 are sent over to Dr. Frederica Kuster at [facility].

## 2011-10-05 ENCOUNTER — Other Ambulatory Visit: Payer: BC Managed Care – PPO | Admitting: Lab

## 2011-10-05 ENCOUNTER — Telehealth: Payer: Self-pay | Admitting: Oncology

## 2011-10-05 ENCOUNTER — Ambulatory Visit
Admission: RE | Admit: 2011-10-05 | Discharge: 2011-10-05 | Disposition: A | Discharge status: Home / Self Care | Payer: BC Managed Care – PPO | Source: Ambulatory Visit | Attending: Radiation Oncology | Admitting: Radiation Oncology

## 2011-10-05 ENCOUNTER — Ambulatory Visit (HOSPITAL_BASED_OUTPATIENT_CLINIC_OR_DEPARTMENT_OTHER): Payer: BC Managed Care – PPO | Admitting: Oncology

## 2011-10-05 DIAGNOSIS — C20 Malignant neoplasm of rectum: Secondary | ICD-10-CM

## 2011-10-05 LAB — CBC WITH DIFFERENTIAL/PLATELET
BASO%: 0.2 % (ref 0.0–2.0)
MCHC: 33.7 g/dL (ref 31.5–36.0)
MONO#: 0.6 10*3/uL (ref 0.1–0.9)
RBC: 4.15 10*6/uL (ref 3.70–5.45)
RDW: 26.7 % — ABNORMAL HIGH (ref 11.2–14.5)
WBC: 5.7 10*3/uL (ref 3.9–10.3)
lymph#: 0.3 10*3/uL — ABNORMAL LOW (ref 0.9–3.3)

## 2011-10-05 LAB — COMPREHENSIVE METABOLIC PANEL
ALT: 9 U/L (ref 0–35)
AST: 21 U/L (ref 0–37)
CO2: 28 mEq/L (ref 19–32)
Calcium: 9.2 mg/dL (ref 8.4–10.5)
Chloride: 102 mEq/L (ref 96–112)
Sodium: 139 mEq/L (ref 135–145)
Total Protein: 6.6 g/dL (ref 6.0–8.3)

## 2011-10-05 NOTE — Procedures (Signed)
[name] underwent simulation verification for her rectal boost.  Her isocenter is in good position and the multileaf collimators contoured the treatment volume appropriately.    ______________________________ Renee Nicholson, M.D. RJM/MEDQ  D:  10/04/2011  T:  10/05/2011  Job:  [id number]

## 2011-10-05 NOTE — Progress Notes (Signed)
OFFICE PROGRESS NOTE  CC: [name], MD [name], MD [name], MD [name], MD, MD [address] [place] [address] [name], MD  DIAGNOSIS: 46 year old female with new diagnosis of rectal carcinoma by rectal ultrasound she was found to have a T3 N1 lesion.  PRIOR THERAPY:   #1 patient presented with a several month history of diarrhea and then subsequent bleeding. She was found to be anemic with a hemoglobin of 10. Because of this she went on to have a colonoscopy performed. The colonoscopy showed multiple sessile polyps in the cecum ascending transverse and sigmoid colon as well as the rectum. The rectal area showed a large exophytic rectal mass approximately 1 cm above the dentate line measuring 3-4 cm. She had a biopsy of this performed and was found to be an adenocarcinoma consistent with a rectal primary.  #2 patient was seen by Dr. [name] at [facility] who performed a rectal ultrasound. The staging clinically was T3 N1. It was recommended by Dr. [name] the patient undergo neoadjuvant chemotherapy and radiation higher to her definitive surgery.  #3 patient is currently scheduled to be seen by Dr. [name] in radiation oncology at [facility]. Her appointment originally was for today 08/22/2011. Unfortunately this was changed to 08/24/2011.  #4 patient has begun radiation therapy with concurrent xeloda at 750 mg BID  CURRENT THERAPY: Single agent xeloda given concurrently with radiation starting 08/22/2011   INTERVAL HISTORY: Renee Nicholson 45 y.o. female returns for followup visit today.patient is overall doing well she denies any fevers chills night sweats headaches shortness of breath chest pains palpitations she did have some diarrhea and has used Imodium for it and her diarrhea has resolved and in fact she became constipated. She is gong to be using the Imodium on an as-needed basis. She has not noticed any bleeding she has  not complaining of having hemorrhoids. She has no nausea or vomiting no hematuria hematochezia melena hemoptysis or hematemesis. She is eating small frequent meals and tolerating that quite well. She is tolerating the radiation very well. Remainder of the 10 point review of systems is negative. MEDICAL HISTORY: Past Medical History  Diagnosis Date  . Anemia   . Diarrhea   . Rectal cancer 08/16/2011  . Colon cancer   . Anxiety   . Bright red rectal bleeding 09/13/2011    ALLERGIES:   has no known allergies.  MEDICATIONS:  Current Outpatient Prescriptions  Medication Sig Dispense Refill  . Calcium Carb-Cholecalciferol (CALCIUM 500 +D) 500-400 MG-UNIT TABS Take by mouth.        . capecitabine (XELODA) 150 MG tablet Take by mouth 2 (two) times daily after a meal.        . capecitabine (XELODA) 500 MG tablet Take by mouth 2 (two) times daily after a meal.        . diphenoxylate-atropine (LOMOTIL) 2.5-0.025 MG per tablet Take 1 tablet by mouth 4 (four) times daily as needed. For stomach      . polysaccharide iron (NIFEREX) 150 MG CAPS capsule Take 150 mg by mouth daily.        . simethicone (MYLICON) 80 MG chewable tablet Chew 80 mg by mouth every 6 (six) hours as needed.          SURGICAL HISTORY:  Past Surgical History  Procedure Date  . Tubal ligation   . Carpal tunnel release   . Colonoscopy 07/26/2011    Procedure: COLONOSCOPY;  Surgeon: Duncan Dull  Rolly Salter, MD;  [facility];  Service: Endoscopy;  Laterality: N/A;  10:40  . Esophagogastroduodenoscopy 07/26/2011    Procedure: ESOPHAGOGASTRODUODENOSCOPY (EGD);  Surgeon: Arlyce Harman, MD;  Location: [facility];  Service: Endoscopy;  Laterality: N/A;    REVIEW OF SYSTEMS:  Pertinent items are noted in HPI.   PHYSICAL EXAMINATION: General appearance: alert, cooperative, appears stated age, mild distress and pale Resp: clear to auscultation bilaterally and normal percussion bilaterally Cardio: regular rate and rhythm, S1,  S2 normal, no murmur, click, rub or gallop GI: soft, non-tender; bowel sounds normal; no masses,  no organomegaly Extremities: extremities normal, atraumatic, no cyanosis or edema Neurologic: Alert and oriented X 3, normal strength and tone. Normal symmetric reflexes. Normal coordination and gait  ECOG PERFORMANCE STATUS: 0 - Asymptomatic  Blood pressure 110/76, pulse 81, temperature 98.1 F (36.7 C), temperature source Oral, height 4\' 9"  (1.448 m), weight 93 lb 3.2 oz (42.275 kg).  LABORATORY DATA: Lab Results  Component Value Date   WBC 5.7 10/05/2011   HGB 12.0 10/05/2011   HCT 35.5 10/05/2011   MCV 85.6 10/05/2011   PLT 212 10/05/2011      Chemistry      Component Value Date/Time   NA 138 09/29/2011 1215   K 4.1 09/29/2011 1215   CL 100 09/29/2011 1215   CO2 29 09/29/2011 1215   BUN 12 09/29/2011 1215   CREATININE 0.68 09/29/2011 1215   CREATININE 0.70 07/27/2011 1306      Component Value Date/Time   CALCIUM 9.2 09/29/2011 1215   ALKPHOS 54 09/29/2011 1215   AST 18 09/29/2011 1215   ALT 9 09/29/2011 1215   BILITOT 0.5 09/29/2011 1215       RADIOGRAPHIC STUDIES:  Nm Pet Image Initial (pi) Skull Base To Thigh 1.  Anorectal primary without evidence of distant metastasis. 2.  Perirectal/peritoneal nodes described on the prior diagnostic CT are less impressive today and may have undergone response to therapy.  Based on location, these were suspicious on the prior. 3.  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 Neoplasm cannot be excluded.  Consider further evaluation with ultrasound. 4.  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  Consider correlation with cardiac risk factors.  5.  Sinus disease.  Original Report Authenticated By: Consuello Bossier, M.D.     ASSESSMENT: 46 year old female with stage III adenocarcinoma of the rectum. Patient is receiving concurrent chemoradiation therapy with chemotherapy consisting of Xeloda. Overall  She is tolerating her treatments well. She is beginning week 5 of radiation and xeloda. Patient did meet with Dr. Donell Beers and she is quite happy with her consultation. Dr. Donell Beers went all went over patient's options in terms of her final surgery. Patient herself is tolerating the radiation and Xeloda well she does complete her therapy next week.  PLAN: Patient will continues to load of for the duration of her radiation therapy. Once she completes radiation therapy we will hold the Xeloda. She will see Dr. Donell Beers back on 10/23/2011. I will also plan on setting her for a PET scan prior to her seeing Dr. Donell Beers today spent extensive amount of time with the patient and her husband as well as her uncle today. We answered all of their questions. She will be seen back on 10/19/2011  All questions were answered. The patient knows to call the clinic with any problems, questions or concerns. We can certainly see the patient much sooner if necessary.  I spent 20 minutes counseling the patient face to face. The total time spent in the appointment was 30 minutes.    Drue Second, MD Medical/Oncology [facility] [phone] (beeper) [phone] (Office)  10/05/2011, 6:28 PM

## 2011-10-05 NOTE — Telephone Encounter (Signed)
called pt and informed her that her appt was cancelled 01/16 and to rtn on 01/24

## 2011-10-06 ENCOUNTER — Ambulatory Visit
Admission: RE | Admit: 2011-10-06 | Discharge: 2011-10-06 | Disposition: A | Discharge status: Home / Self Care | Payer: BC Managed Care – PPO | Source: Ambulatory Visit | Attending: Radiation Oncology | Admitting: Radiation Oncology

## 2011-10-09 ENCOUNTER — Ambulatory Visit
Admission: RE | Admit: 2011-10-09 | Discharge: 2011-10-09 | Disposition: A | Discharge status: Home / Self Care | Payer: BC Managed Care – PPO | Source: Ambulatory Visit | Attending: Radiation Oncology | Admitting: Radiation Oncology

## 2011-10-09 ENCOUNTER — Encounter: Payer: Self-pay | Admitting: Radiation Oncology

## 2011-10-09 VITALS — BP 116/78 | HR 72 | Resp 18 | Wt 95.0 lb

## 2011-10-09 DIAGNOSIS — C20 Malignant neoplasm of rectum: Secondary | ICD-10-CM

## 2011-10-09 NOTE — Progress Notes (Signed)
Patient presents to the clinic today accompanied by her husband for under treat visit with Dr. Dayton Scrape. Patient is alert and oriented to person, place, and time. No distress noted. Steady gait noted. Pleasant affect noted. Patient reports rectal pain 6 on a scale of 0-10 related to the effects of her "hemorroids." Patient reports intermittent episode of bilateral hips pain as well. Patient reports 3-4 "paste like" bowel movements per day. Patient reports marked fatigue. Patient reports gas x helps relieve bloating and gas discomfort. Patient scheduled to follow up with Dr. Welton Flakes 1/24. Patient understanding she is to take her last Xeloda tablet tonight. Reported all findings to Dr. Dayton Scrape. Patient provided an appointment card to return in one month for follow up.

## 2011-10-09 NOTE — Progress Notes (Signed)
Weekly Management Note:  Site:Rectum (rectal tumor boost) Current Dose:  5040  cGy Projected Dose: 5040  cGy  Narrative: The patient is seen today for routine under treatment assessment. CBCT/MVCT images/port films were reviewed. The chart was reviewed.   No new complaints today. She still having rectal discomfort which she attributes to her hemorrhoids. She is not on any pain medication. She uses Preparation H and sitz baths. Blood counts from January 10 or satisfactory. She finishes her Xeloda later today.  Physical Examination:  Filed Vitals:   10/09/11 1214  BP: 116/78  Pulse: 72  Resp: 18  .  Weight: 95 lb (43.092 kg). No change.  Impression: Tolerating radiation therapy well. Radiation therapy completed today.  Plan: Followup visit here one month. She'll see Dr. Welton Flakes on January 24 and Dr. Donell Beers on January 28.

## 2011-10-09 NOTE — Progress Notes (Signed)
[facility] Radiation Oncology  Name:Renee Nicholson  Date:10/09/2011           ZOX:[id number] DOB:17-Dec-1965   Status:outpatient    CC: [name], MD, MD  Drue Second, MD, [name], MD  REFERRING PHYSICIAN:Khan, Kalsoom, MD  DIAGNOSIS: Clinical stage III (T3, N1, M0) adenocarcinoma of the rectum  INDICATION FOR TREATMENT: "Curative"  (pre-op)   TREATMENT DATES: 08/29/2011 through 10/09/2011                          SITE/DOSE:  Pelvis/rectum 4500 cGy 25 sessions, rectal primary boost 540 cGy 3 sessions for a cumulative tumor dose of 5040 cGy in 28 sessions                          BEAMS/ENERGY:   6 MV photons PA, and 18 MV photons right and left lateral fields throughout her treatment.            NARRATIVE:  She receive concomitant Xeloda during her course of radiation therapy under the direction of Dr. Welton Flakes.  The patient tolerated her treatment reasonably well but did have moderate proctitis and perianal dermatitis as expected. This was managed with sitz baths and Preparation H.  Early on, she did have diarrhea which was controlled with Imodium.   She took Percocet for discomfort early during her course of therapy but did not require any pain medication during her last 2 weeks of therapy.  her blood counts remain satisfactory. She was seen in consultation by Dr. Donell Beers who will see her back later this month for scheduling of her surgery.                      PLAN: Routine followup in one month. Patient instructed to call if questions or worsening complaints in the interim. She will see Dr. Donell Beers on January 28 and Dr. Welton Flakes on January 24.

## 2011-10-11 ENCOUNTER — Other Ambulatory Visit: Payer: BC Managed Care – PPO | Admitting: Lab

## 2011-10-11 ENCOUNTER — Ambulatory Visit: Payer: BC Managed Care – PPO | Admitting: Oncology

## 2011-10-19 ENCOUNTER — Other Ambulatory Visit: Payer: BC Managed Care – PPO | Admitting: Lab

## 2011-10-19 ENCOUNTER — Ambulatory Visit (HOSPITAL_BASED_OUTPATIENT_CLINIC_OR_DEPARTMENT_OTHER): Payer: BC Managed Care – PPO | Admitting: Oncology

## 2011-10-19 ENCOUNTER — Telehealth: Payer: Self-pay | Admitting: *Deleted

## 2011-10-19 ENCOUNTER — Encounter: Payer: Self-pay | Admitting: Oncology

## 2011-10-19 VITALS — BP 124/80 | HR 74 | Temp 97.8°F | Ht <= 58 in | Wt 94.7 lb

## 2011-10-19 DIAGNOSIS — C20 Malignant neoplasm of rectum: Secondary | ICD-10-CM

## 2011-10-19 DIAGNOSIS — K6289 Other specified diseases of anus and rectum: Secondary | ICD-10-CM

## 2011-10-19 LAB — COMPREHENSIVE METABOLIC PANEL
ALT: 15 U/L (ref 0–35)
AST: 24 U/L (ref 0–37)
Alkaline Phosphatase: 56 U/L (ref 39–117)
CO2: 28 mEq/L (ref 19–32)
Sodium: 140 mEq/L (ref 135–145)
Total Bilirubin: 0.4 mg/dL (ref 0.3–1.2)
Total Protein: 6.4 g/dL (ref 6.0–8.3)

## 2011-10-19 LAB — CBC WITH DIFFERENTIAL/PLATELET
BASO%: 0.3 % (ref 0.0–2.0)
EOS%: 2.8 % (ref 0.0–7.0)
LYMPH%: 10.2 % — ABNORMAL LOW (ref 14.0–49.7)
MCH: 30 pg (ref 25.1–34.0)
MCHC: 34.3 g/dL (ref 31.5–36.0)
MONO#: 0.5 10*3/uL (ref 0.1–0.9)
Platelets: 154 10*3/uL (ref 145–400)
RBC: 3.93 10*6/uL (ref 3.70–5.45)
WBC: 4 10*3/uL (ref 3.9–10.3)
lymph#: 0.4 10*3/uL — ABNORMAL LOW (ref 0.9–3.3)

## 2011-10-19 LAB — CEA: CEA: 0.7 ng/mL (ref 0.0–5.0)

## 2011-10-19 NOTE — Telephone Encounter (Signed)
na

## 2011-10-22 NOTE — Progress Notes (Signed)
OFFICE PROGRESS NOTE  CC: [name], MD [name], MD [name], MD [name], MD, MD [address] [place] [address] [name], MD  DIAGNOSIS: 46 year old female with new diagnosis of rectal carcinoma by rectal ultrasound she was found to have a T3 N1 lesion.  PRIOR THERAPY:   #1 patient presented with a several month history of diarrhea and then subsequent bleeding. She was found to be anemic with a hemoglobin of 10. Because of this she went on to have a colonoscopy performed. The colonoscopy showed multiple sessile polyps in the cecum ascending transverse and sigmoid colon as well as the rectum. The rectal area showed a large exophytic rectal mass approximately 1 cm above the dentate line measuring 3-4 cm. She had a biopsy of this performed and was found to be an adenocarcinoma consistent with a rectal primary.  #2 patient was seen by Dr. [name] at [facility] who performed a rectal ultrasound. The staging clinically was T3 N1. It was recommended by Dr. [name] the patient undergo neoadjuvant chemotherapy and radiation higher to her definitive surgery.  #3 Patient has completed concurrent radiation and chemotherapy. Her chemotherapy consisted of single agent xeloda administrated 08/22/11 - 10/09/2011. Overall she tolerated her treatment very well  CURRENT THERAPY: Observation, awaiting surgery.   INTERVAL HISTORY: Renee Nicholson 45 y.o. female returns for followup visit today.She has competed all of initial neoadjuvant chemo/RT. Overall she tolerated her treatments well. Today she does complain about rectal discomfort. She has a history of hemorrhoids in the past and she is experiencing some rectal discomfort secondary to that. She has used preparation H and sitz baths with some relief. She is having soft stools and that does alternate with some bouts of constipation. She is eating a little better and has been able to maintain her weight fairly well. No  fevers, chills or night sweats, no headaches, no nausea or vomiting, no aches or pains. No recta bleeding or any other bleeding. Remainder of the 10 point review of systems is negative. MEDICAL HISTORY: Past Medical History  Diagnosis Date  . Anemia   . Diarrhea   . Rectal cancer 08/16/2011  . Colon cancer   . Anxiety   . Bright red rectal bleeding 09/13/2011    ALLERGIES:   has no known allergies.  MEDICATIONS:  Current Outpatient Prescriptions  Medication Sig Dispense Refill  . Calcium Carb-Cholecalciferol (CALCIUM 500 +D) 500-400 MG-UNIT TABS Take by mouth.        . capecitabine (XELODA) 150 MG tablet Take by mouth 2 (two) times daily after a meal.        . capecitabine (XELODA) 500 MG tablet Take by mouth 2 (two) times daily after a meal.        . diphenoxylate-atropine (LOMOTIL) 2.5-0.025 MG per tablet Take 1 tablet by mouth 4 (four) times daily as needed. For stomach      . polysaccharide iron (NIFEREX) 150 MG CAPS capsule Take 150 mg by mouth daily.        . simethicone (MYLICON) 80 MG chewable tablet Chew 80 mg by mouth every 6 (six) hours as needed.          SURGICAL HISTORY:  Past Surgical History  Procedure Date  . Tubal ligation   . Carpal tunnel release   . Colonoscopy 07/26/2011    Procedure: COLONOSCOPY;  Surgeon: Arlyce Harman, MD;  [facility]: [facility];  Service: Endoscopy;  Laterality: N/A;  10:40  . Esophagogastroduodenoscopy 07/26/2011  Procedure: ESOPHAGOGASTRODUODENOSCOPY (EGD);  Surgeon: Arlyce Harman, MD;  Location: [facility];  Service: Endoscopy;  Laterality: N/A;    REVIEW OF SYSTEMS:  Pertinent items are noted in HPI.   PHYSICAL EXAMINATION: General appearance: alert, cooperative, appears stated age, mild distress and pale Resp: clear to auscultation bilaterally and normal percussion bilaterally Cardio: regular rate and rhythm, S1, S2 normal, no murmur, click, rub or gallop GI: soft, non-tender; bowel sounds normal; no masses,  no  organomegaly Extremities: extremities normal, atraumatic, no cyanosis or edema Neurologic: Alert and oriented X 3, normal strength and tone. Normal symmetric reflexes. Normal coordination and gait Rectal Exam: patient does not have any active bleeding, there is noted to be external hemorrhoids.  ECOG PERFORMANCE STATUS: 0 - Asymptomatic  Blood pressure 124/80, pulse 74, temperature 97.8 F (36.6 C), temperature source Oral, height 4\' 9"  (1.448 m), weight 94 lb 11.2 oz (42.956 kg).  LABORATORY DATA: Lab Results  Component Value Date   WBC 4.0 10/19/2011   HGB 11.8 10/19/2011   HCT 34.4* 10/19/2011   MCV 87.7 10/19/2011   PLT 154 10/19/2011      Chemistry      Component Value Date/Time   NA 140 10/19/2011 1112   K 3.8 10/19/2011 1112   CL 102 10/19/2011 1112   CO2 28 10/19/2011 1112   BUN 10 10/19/2011 1112   CREATININE 0.62 10/19/2011 1112   CREATININE 0.70 07/27/2011 1306      Component Value Date/Time   CALCIUM 9.2 10/19/2011 1112   ALKPHOS 56 10/19/2011 1112   AST 24 10/19/2011 1112   ALT 15 10/19/2011 1112   BILITOT 0.4 10/19/2011 1112       RADIOGRAPHIC STUDIES:  Nm Pet Image Initial (pi) Skull Base To Thigh 1.  Anorectal primary without evidence of distant metastasis. 2.  Perirectal/peritoneal nodes described on the prior diagnostic CT are less impressive today and may have undergone response to therapy.  Based on location, these were suspicious on the prior. 3.  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 Neoplasm cannot be excluded.  Consider further evaluation with ultrasound. 4.  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  Consider correlation with cardiac risk factors.  5.  Sinus disease.  Original Report Authenticated By: Consuello Bossier, M.D.     ASSESSMENT: 46 year old female with stage III adenocarcinoma of the rectum.  #1 Has completed all of neoadjuvant treatment.  2. Restaging PET scan ordered and  results above.  3. Symptomatic managment of Hemorrhoids  PLAN: 1. PET scan for restaging 2. See Dr. Donell Beers for follow up. 3. Follow up with me in 3 1 month or so.   All questions were answered. The patient knows to call the clinic with any problems, questions or concerns. We can certainly see the patient much sooner if necessary.  I spent 20 minutes counseling the patient face to face. The total time spent in the appointment was 30 minutes.    Drue Second, MD Medical/Oncology [facility] [phone] (beeper) [phone] (Office)  10/22/2011, 12:25 PM

## 2011-10-23 ENCOUNTER — Ambulatory Visit (INDEPENDENT_AMBULATORY_CARE_PROVIDER_SITE_OTHER): Payer: BC Managed Care – PPO | Admitting: General Surgery

## 2011-10-23 ENCOUNTER — Other Ambulatory Visit (INDEPENDENT_AMBULATORY_CARE_PROVIDER_SITE_OTHER): Payer: Self-pay | Admitting: General Surgery

## 2011-10-23 VITALS — BP 132/80 | HR 45 | Temp 99.2°F | Resp 14 | Ht <= 58 in | Wt 94.8 lb

## 2011-10-23 DIAGNOSIS — C20 Malignant neoplasm of rectum: Secondary | ICD-10-CM

## 2011-10-23 NOTE — Progress Notes (Signed)
HISTORY: Pt doing better after radiation over 2 weeks ago.  She is having better bowel movements.  Her anal pain is getting some better.  She denies nausea or vomiting.  She has lost some weight during treatment, but not a lot.  She does not have other complaints.     PERTINENT REVIEW OF SYSTEMS: Otherwise negative.     EXAM: Head: Normocephalic and atraumatic.  Eyes:  Conjunctivae are normal. Pupils are equal, round, and reactive to light. No scleral icterus.  Neck:  Normal range of motion. Neck supple. No tracheal deviation present. No thyromegaly present.  Resp: No respiratory distress, normal effort. Abd:  Abdomen is soft, non distended and non tender. No masses are palpable.  There is no rebound and no guarding.  Rectal:  Mass is posterior and around 1.5-2 cm above anal verge.  She has small hemorrhoid anteriorly that is tender.   Neurological: Alert and oriented to person, place, and time. Coordination normal.  Skin: Skin is warm and dry. No rash noted. No diaphoretic. No erythema. No pallor.  Psychiatric: Normal mood and affect. Normal behavior. Judgment and thought content normal.     ASSESSMENT AND PLAN:   Rectal cancer Due to rectal cancer and 22 adenomatous polyps spread throughout colon, will plan for TAC/proctectomy.   Will try to save anal sphincters.  Will send pathology to see if margin is positive.  If we cannot clear sphincters of cancer, will do APR with TAC and do end ileostomy. If can save sphincters, will do IPAA and diverting ostomy. Will plan for 6-10 weeks post radiation end.   Reviewed this plan with pt and family.   Discussed risk of poor bowel function even if sphincters are saved.  Discussed risk of leak.   Pt may need additional chemotherapy after surgery.         Maudry Diego, MD Surgical Oncology, General & Endocrine Surgery [facility]  [name], MD, MD [name], MD

## 2011-10-23 NOTE — Assessment & Plan Note (Signed)
Due to rectal cancer and 22 adenomatous polyps spread throughout colon, will plan for TAC/proctectomy.   Will try to save anal sphincters.  Will send pathology to see if margin is positive.  If we cannot clear sphincters of cancer, will do APR with TAC and do end ileostomy. If can save sphincters, will do IPAA and diverting ostomy. Will plan for 6-10 weeks post radiation end.   Reviewed this plan with pt and family.   Discussed risk of poor bowel function even if sphincters are saved.  Discussed risk of leak.   Pt may need additional chemotherapy after surgery.

## 2011-10-23 NOTE — Patient Instructions (Signed)
Cancer of the Colon, Surgical Treatment Based on the location and type of colon cancer that you have, we have determined that surgical removal of this part of your colon will be part of your treatment.  This will treat symptoms of bleeding or blockage that you may be experiencing.  I will do everything that I can to remove your entire tumor safely. To do this, some normal tissue must also be removed to give you the best chance for a cure. The following will help describe what happens when you have this surgery.  TREATMENT Surgery is the most common treatment for colorectal cancer. It is a type of local therapy. It treats the cancer in the colon or rectum and the area close to the tumor by removing the tumor and some of the healthy tissue around it. This tissue includes an apron of tissue with lymph nodes and blood vessels.  We usually use laparoscopy (smaller incisions) to help minimize the size of the larger incision on your abdomen.  However, even if we use laparoscopy primarily, we still need to make an incision large enough to bring the two ends of the colon out to reconnect them.  For tumors that are very large or very adherent to the adjacent structures, we may have to make a larger incision to do your operation safely. The surgeon checks the rest of the abdomen, the intestine and the liver to see if the cancer has spread.  There may be microscopic disease present that we are unable to detect.  We may do a liver ultrasound during surgery as part of this evaluation.    Because of how low the cancer is to your anus, you will definitely have an ileostomy.  In this case, we will make a new path for your bowel movements to leave the body. This is an opening (a stoma) in the wall of the abdomen. The upper end of the intestine is then connected to the stoma. The other end is closed. The operation to create the stoma is called a colostomy. A flat bag fits over the stoma to collect waste, and a special adhesive  holds it in place.  Most patients are able to do their normal activities with a colostomy, including travelling, exercising, and swimming.    The ostomy may or may not be temporary.  If we are able to save the anal sphincters, you will have an ostomy that is temporary.  If we are unable to get negative cancer margins at the anus, you will need a permanent ileostomy.   Some patients will need to get additional treatment such as chemotherapy before the ostomy can be taken down.  After this occurs, we can reconnect the parts of the intestine and closes the stoma.  You may have issues later with your anal muscles that may prevent or delay our ability to take down the ostomy.    The part of colon that we plan to remove in your case is the entire colon and most, if not all, of the rectum.   Whatever the operative findings, I will discuss the case with your family after we are done in the operating room.  I will talk to you in the next few days when you are more awake.  I will see you in the hospital every weekday that I am not out of town.  My partners help see patients on the weekends and if I am out of town.     IF YOU ARE  TAKING ASPIRIN, PLAVIX, COUMADIN, OR OTHER BLOOD THINNERS, LET us KNOW IMMEDIATELY SO WE CAN CONTACT YOUR PRESCRIBING HEALTH CARE PROVIDER TO HOLD THE MEDICATION FOR 5-7 DAYS BEFORE SURGERY    WHAT HAPPENS AFTER SURGERY: After surgery, you will go first to the recovery room, then to your room.   You will have many tubes and lines in place which is standard for this operation.  You will have several IVs, including possibly a central line in your chest or neck.  YOU WILL NOT BE ABLE TO EAT FOR SEVERAL DAYS AFTER SURGERY.  You will have a catheter in your bladder for around 1-3 days.  On your abdomen, you will have and possibly a pain pump with numbing medicine.  You will have compression stockings on your legs to decrease the risk of blood clots.    We will address your pain in several  ways.  We will use an IV pain pump called a "PCA," or Patient Controlled Analgesia.  This allows you to press a button and immediately receive a dose of pain medication without waiting for a nurse.  We also use IV Tylenol and sometimes IV Toradol which is similar to ibuprofen.  You may also have a pump with numbing medicine delivered directly to your incision.  I use doses and medications that work for the majority of people, but you may need an adjustment to the dose or type of medicine if your pain is not adequately controlled.  Your throat may be sore, in which case you may need a throat spray or lozenges.  Some patients become disoriented with the pain medication and may need adjustments due to that.    We will ask you to get out of bed the day after surgery in order to maximize your chances of not having complications.  Your risk of pneumonia and blood clots is lower with walking and sitting in the chair.  We will also ask you to perform breathing exercises.  We will also ask to you walk in your room and in the halls for the above reasons, but also in order for you to keep up your strength.    EATING: We will usually start you on clear liquids in around 1-2 days if your bowel function seems to be returning.  We advance your diet slowly to make sure you are tolerating each step.  All patients do not have a normal appetite when they go home.  Many patients also find that their taste buds do not seem the same right after surgery, and this can continue into the time of possible post operative chemotherapy and radiation.  We may need to use different doses or type of medicine than you use at home to control high blood pressure, diabetes, or other medical problems.   SLEEPING: We do not give sleeping medications in the hospital routinely.  Many patients have difficulty sleeping due to the unfamiliar environment or the medical care that occurs at night.  Sleeping medications can interfere with the pain  medications and cause significant mental status changes that are dangerous.    OTHER TREATMENT? I will present your case when the pathology is available at our GI Multidisciplinary conference which occurs every 1-2 weeks.  Medical and Radiation Oncologists are present, as well as the pathologist and radiologist in addition to myself.  If you have a larger tumor or if you have positive lymph nodes, we may have the oncologist stop by to see you while you are in the  hospital.  Most firm post op treatment plans occur, however, once you are an outpatient because we will need to see how you are recovering from surgery.  GOING HOME! Usually you are able to go home in 4-8 days, depending on whether or not complications happen and what is going on with your overall health status.   If you have more health problems or if you have limited help at home, the therapists and nurses may recommend a temporary rehab or nursing facility to help you get back on your feet before you go home.  These decisions would be made while you are in the hospital with the assistance of a social worker or case manager.  You cannot do any heavy lifting for 6-8 weeks due to the risk of hernia.    Please bring all insurance/disability forms to our office for the staff to fill out   POSSIBLE COMPLICATIONS This is a major operation and includes complications listed below: Bleeding Infection and possible wound complications such as hernia Damage to adjacent structures Leak of surgical connections Possible permanent ostomy Possible need for other procedures, such as abscess drains in radiology  Possible prolonged hospital stay Possible diarrhea from removal of part of the colon. Possible constipation from narcotics.    Prolonged fatigue/weakness/appetite MOST PATIENTS' ENERGY LEVEL IS NOT BACK TO NORMAL FOR AT LEAST 2-4 MONTHS.  OLDER PATIENTS MAY FEEL WEAK FOR LONGER PERIODS OF TIME.   Difficulty with eating or post operative  nausea  Possible early recurrence of cancer Possible complications of your medical problems such as heart disease or arrhythmias. Death (less than 1%)  All possible complications are not listed, just the most common.    FURTHER INFORMATION? Please ask questions if you find something that we did not discuss in the office and would like more information.  If you would like another appointment if you have many questions or if your family members would like to come as well, please contact the office.    Between scheduled visits you should contact your caregivers as soon as any health problems appear.

## 2011-10-27 ENCOUNTER — Encounter (HOSPITAL_COMMUNITY)
Admission: RE | Admit: 2011-10-27 | Discharge: 2011-10-27 | Disposition: A | Discharge status: Home / Self Care | Payer: BC Managed Care – PPO | Source: Ambulatory Visit | Attending: Oncology | Admitting: Oncology

## 2011-10-27 ENCOUNTER — Encounter (HOSPITAL_COMMUNITY): Payer: Self-pay

## 2011-10-27 DIAGNOSIS — C20 Malignant neoplasm of rectum: Secondary | ICD-10-CM

## 2011-10-27 MED ORDER — FLUDEOXYGLUCOSE F - 18 (FDG) INJECTION
19.0000 | Freq: Once | INTRAVENOUS | Status: AC | PRN
  Administered 2011-10-27: 19 via INTRAVENOUS

## 2011-10-27 MED ORDER — IOHEXOL 300 MG/ML  SOLN
100.0000 mL | Freq: Once | INTRAMUSCULAR | Status: AC | PRN
  Administered 2011-10-27: 100 mL via INTRAVENOUS

## 2011-10-31 ENCOUNTER — Telehealth: Payer: Self-pay | Admitting: *Deleted

## 2011-10-31 NOTE — Telephone Encounter (Signed)
Message copied by Cooper Render on Tue Oct 31, 2011 12:53 PM ------      Message from: Renee Nicholson      Created: Mon Oct 30, 2011 10:28 PM       Call patient: PET scan does no show any residual cancer

## 2011-10-31 NOTE — Telephone Encounter (Signed)
   Per MD,called pt at home, she requested Rahjay to speak with me who advised he was with the family and interpreter, Requested he tell pt PET scan does not show any residual cancer. Rahjay confirmed he would tell her. Denied needing further assistance

## 2011-11-08 ENCOUNTER — Ambulatory Visit
Admission: RE | Admit: 2011-11-08 | Discharge: 2011-11-08 | Disposition: A | Discharge status: Home / Self Care | Payer: BC Managed Care – PPO | Source: Ambulatory Visit | Attending: Radiation Oncology | Admitting: Radiation Oncology

## 2011-11-08 ENCOUNTER — Telehealth: Payer: Self-pay | Admitting: Genetic Counselor

## 2011-11-08 ENCOUNTER — Encounter: Payer: Self-pay | Admitting: Radiation Oncology

## 2011-11-08 VITALS — BP 114/76 | HR 76 | Temp 97.4°F | Resp 18 | Wt 95.2 lb

## 2011-11-08 DIAGNOSIS — C20 Malignant neoplasm of rectum: Secondary | ICD-10-CM

## 2011-11-08 NOTE — Progress Notes (Signed)
Followup note:  The patient returns today approximately 1 month following completion of preoperative chemoradiation in the management of her T3 N1 adenocarcinoma of the rectum. She is without complaints today. She tells me that she has on the average of 4 formed stools a day. She saw Dr. Donell Beers 2 weeks ago, the family tells me that she will have a total colectomy/proctectomy on March 21, therapy to spare the anal sphincters.  Physical simulation: Limited to digital rectal examination. The previously noted mass along the anorectum is barely palpable. I would estimate that she is at least 90% tumor regression thus far.  Impression: Favorable regression.  Plan: Surgery as scheduled above. Followup visit here in 3 months.

## 2011-11-08 NOTE — Progress Notes (Signed)
Patient presents to the clinic today accompanied by her family for a follow up appointment with Dr. Dayton Scrape. Patient is alert and oriented to person, place, and time. No distress noted. Steady gait noted. Pleasant affect noted. Patient denies pain at this time. Patient reports that she is having trouble with her hemorrhoids. Patient reports that on average she has four formed bowel movements in a day. Patient denies seeing any blood in her stool. Patient reports her appetite is good. Patient's weight stable. Patient denies nausea, vomiting, diarrhea, or headache. Reported all findings to Dr. Dayton Scrape.

## 2011-11-13 ENCOUNTER — Ambulatory Visit: Payer: BC Managed Care – PPO

## 2011-11-13 ENCOUNTER — Other Ambulatory Visit: Payer: BC Managed Care – PPO

## 2011-11-13 NOTE — Progress Notes (Signed)
Pt seen with husband and family friend to discuss implications of MYH testing indicating she has a homozygous mutation. Husband is considering testing. If he does not, recommended both her children get tested.

## 2011-11-16 ENCOUNTER — Other Ambulatory Visit: Payer: BC Managed Care – PPO | Admitting: Lab

## 2011-11-16 ENCOUNTER — Telehealth: Payer: Self-pay | Admitting: *Deleted

## 2011-11-16 ENCOUNTER — Ambulatory Visit (HOSPITAL_BASED_OUTPATIENT_CLINIC_OR_DEPARTMENT_OTHER): Payer: BC Managed Care – PPO | Admitting: Oncology

## 2011-11-16 VITALS — BP 120/83 | HR 73 | Temp 97.9°F | Ht <= 58 in | Wt 97.9 lb

## 2011-11-16 DIAGNOSIS — C20 Malignant neoplasm of rectum: Secondary | ICD-10-CM

## 2011-11-16 LAB — CBC WITH DIFFERENTIAL/PLATELET
BASO%: 0.4 % (ref 0.0–2.0)
EOS%: 2.9 % (ref 0.0–7.0)
HCT: 33.7 % — ABNORMAL LOW (ref 34.8–46.6)
LYMPH%: 18.1 % (ref 14.0–49.7)
MCH: 30.1 pg (ref 25.1–34.0)
MCHC: 33.9 g/dL (ref 31.5–36.0)
MCV: 88.7 fL (ref 79.5–101.0)
MONO#: 0.6 10*3/uL (ref 0.1–0.9)
NEUT%: 66.5 % (ref 38.4–76.8)
Platelets: 180 10*3/uL (ref 145–400)

## 2011-11-16 LAB — COMPREHENSIVE METABOLIC PANEL
ALT: 16 U/L (ref 0–35)
CO2: 26 mEq/L (ref 19–32)
Creatinine, Ser: 0.6 mg/dL (ref 0.50–1.10)
Total Bilirubin: 0.4 mg/dL (ref 0.3–1.2)

## 2011-11-16 LAB — CEA: CEA: 0.5 ng/mL (ref 0.0–5.0)

## 2011-11-16 NOTE — Progress Notes (Signed)
OFFICE PROGRESS NOTE  CC: [name], MD [name], MD [name], MD [name], MD, MD [address] [place] [address] [name], MD  DIAGNOSIS: 46 year old female with new diagnosis of rectal carcinoma by rectal ultrasound she was found to have a T3 N1 lesion.  PRIOR THERAPY:   #1 patient presented with a several month history of diarrhea and then subsequent bleeding. She was found to be anemic with a hemoglobin of 10. Because of this she went on to have a colonoscopy performed. The colonoscopy showed multiple sessile polyps in the cecum ascending transverse and sigmoid colon as well as the rectum. The rectal area showed a large exophytic rectal mass approximately 1 cm above the dentate line measuring 3-4 cm. She had a biopsy of this performed and was found to be an adenocarcinoma consistent with a rectal primary.  #2 patient was seen by Dr. [name] at [facility] who performed a rectal ultrasound. The staging clinically was T3 N1. It was recommended by Dr. [name] the patient undergo neoadjuvant chemotherapy and radiation higher to her definitive surgery.  #3 Patient has completed concurrent radiation and chemotherapy. Her chemotherapy consisted of single agent xeloda administrated 08/22/11 - 10/09/2011. Overall she tolerated her treatment very well  CURRENT THERAPY: Observation, awaiting surgery.   INTERVAL HISTORY: Renee Nicholson 45 y.o. female returns for followup visit today.She has competed all of initial neoadjuvant chemo/RT. Overall she tolerated her treatments well. Today she does complain about rectal discomfort. She has a history of hemorrhoids in the past and she is experiencing some rectal discomfort secondary to that. She has used preparation H and sitz baths with some relief. She is having soft stools and that does alternate with some bouts of constipation. She is eating a little better and has been able to maintain her weight fairly well. No  fevers, chills or night sweats, no headaches, no nausea or vomiting, no aches or pains. No recta bleeding or any other bleeding. Remainder of the 10 point review of systems is negative. MEDICAL HISTORY: Past Medical History  Diagnosis Date  . Anemia   . Diarrhea   . Rectal cancer 08/16/2011  . Colon cancer   . Anxiety   . Bright red rectal bleeding 09/13/2011    ALLERGIES:   has no known allergies.  MEDICATIONS:  Current Outpatient Prescriptions  Medication Sig Dispense Refill  . Calcium Carb-Cholecalciferol (CALCIUM 500 +D) 500-400 MG-UNIT TABS Take by mouth.        . capecitabine (XELODA) 150 MG tablet Take by mouth 2 (two) times daily after a meal.        . capecitabine (XELODA) 500 MG tablet Take by mouth 2 (two) times daily after a meal.        . diphenoxylate-atropine (LOMOTIL) 2.5-0.025 MG per tablet Take 1 tablet by mouth 4 (four) times daily as needed. For stomach      . Multiple Vitamin (MULTIVITAMIN) tablet Take 1 tablet by mouth daily.      Marland Kitchen oxyCODONE-acetaminophen (PERCOCET) 5-325 MG per tablet Take 1 tablet by mouth every 4 (four) hours as needed.      . polysaccharide iron (NIFEREX) 150 MG CAPS capsule Take 150 mg by mouth daily.        . simethicone (MYLICON) 80 MG chewable tablet Chew 80 mg by mouth every 6 (six) hours as needed.          SURGICAL HISTORY:  Past Surgical History  Procedure Date  . Tubal ligation   .  Carpal tunnel release   . Colonoscopy 07/26/2011    Procedure: COLONOSCOPY;  Surgeon: Arlyce Harman, MD;  Location: [facility];  Service: Endoscopy;  Laterality: N/A;  10:40  . Esophagogastroduodenoscopy 07/26/2011    Procedure: ESOPHAGOGASTRODUODENOSCOPY (EGD);  Surgeon: Arlyce Harman, MD;  Location: [facility];  Service: Endoscopy;  Laterality: N/A;    REVIEW OF SYSTEMS:  Pertinent items are noted in HPI.   PHYSICAL EXAMINATION: General appearance: alert, cooperative, appears stated age, mild distress and pale Resp: clear to  auscultation bilaterally and normal percussion bilaterally Cardio: regular rate and rhythm, S1, S2 normal, no murmur, click, rub or gallop GI: soft, non-tender; bowel sounds normal; no masses,  no organomegaly Extremities: extremities normal, atraumatic, no cyanosis or edema Neurologic: Alert and oriented X 3, normal strength and tone. Normal symmetric reflexes. Normal coordination and gait Rectal Exam: patient does not have any active bleeding, there is noted to be external hemorrhoids.  ECOG PERFORMANCE STATUS: 0 - Asymptomatic  Blood pressure 120/83, pulse 73, temperature 97.9 F (36.6 C), height 4\' 9"  (1.448 m), weight 97 lb 14.4 oz (44.407 kg), last menstrual period 09/27/2011.  LABORATORY DATA: Lab Results  Component Value Date   WBC 4.7 11/16/2011   HGB 11.4* 11/16/2011   HCT 33.7* 11/16/2011   MCV 88.7 11/16/2011   PLT 180 11/16/2011      Chemistry      Component Value Date/Time   NA 138 11/16/2011 1309   K 4.1 11/16/2011 1309   CL 102 11/16/2011 1309   CO2 26 11/16/2011 1309   BUN 15 11/16/2011 1309   CREATININE 0.60 11/16/2011 1309   CREATININE 0.70 07/27/2011 1306      Component Value Date/Time   CALCIUM 9.3 11/16/2011 1309   ALKPHOS 53 11/16/2011 1309   AST 23 11/16/2011 1309   ALT 16 11/16/2011 1309   BILITOT 0.4 11/16/2011 1309       RADIOGRAPHIC STUDIES:  Nm Pet Image Initial (pi) Skull Base To Thigh 1.  Anorectal primary without evidence of distant metastasis. 2.  Perirectal/peritoneal nodes described on the prior diagnostic CT are less impressive today and may have undergone response to therapy.  Based on location, these were suspicious on the prior. 3.  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 Neoplasm cannot be excluded.  Consider further evaluation with ultrasound. 4.  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  Consider correlation with cardiac risk factors.  5.  Sinus disease.  Original Report  Authenticated By: Consuello Bossier, M.D.     ASSESSMENT: 46 year old female with stage III adenocarcinoma of the rectum.   #1 Has completed all of neoadjuvant treatment And the PET scan shows no evidence of distant metastasis and a para rectal peritoneal nodes less impressive she has had significant response to therapy. #2 patient has been seen by Dr. 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 APC is negative  PLAN:  #1 patient will proceed with her surgery as scheduled.  #2 we discussed the implications of her genetic test results.  #3 patient will be seen after the surgery and followup with me. At that time we will discuss adjuvant chemotherapy.  #4 all of patient's husband's concerns were discussed.  All questions were answered. The patient  knows to call the clinic with any problems, questions or concerns. We can certainly see the patient much sooner if necessary.  I spent 20 minutes counseling the patient face to face. The total time spent in the appointment was 30 minutes.    Drue Second, MD Medical/Oncology [facility] [phone] (beeper) [phone] (Office)  11/16/2011, 5:42 PM

## 2011-11-16 NOTE — Telephone Encounter (Signed)
gave patient appointment for 01-03-2012 starting at 11:30am printed out calendar and gave to the patient's husband

## 2011-12-06 ENCOUNTER — Encounter (HOSPITAL_COMMUNITY): Payer: Self-pay | Admitting: Pharmacy Technician

## 2011-12-12 ENCOUNTER — Encounter (HOSPITAL_COMMUNITY)
Admission: RE | Admit: 2011-12-12 | Discharge: 2011-12-12 | Disposition: A | Discharge status: Home / Self Care | Payer: BC Managed Care – PPO | Source: Ambulatory Visit | Attending: General Surgery | Admitting: General Surgery

## 2011-12-12 ENCOUNTER — Ambulatory Visit (INDEPENDENT_AMBULATORY_CARE_PROVIDER_SITE_OTHER): Payer: BC Managed Care – PPO | Admitting: General Surgery

## 2011-12-12 ENCOUNTER — Encounter (HOSPITAL_COMMUNITY): Payer: Self-pay

## 2011-12-12 ENCOUNTER — Encounter (INDEPENDENT_AMBULATORY_CARE_PROVIDER_SITE_OTHER): Payer: Self-pay | Admitting: General Surgery

## 2011-12-12 VITALS — BP 114/68 | HR 66 | Temp 97.4°F | Resp 18 | Ht <= 58 in | Wt 97.0 lb

## 2011-12-12 DIAGNOSIS — C20 Malignant neoplasm of rectum: Secondary | ICD-10-CM

## 2011-12-12 LAB — CBC
Hemoglobin: 10.5 g/dL — ABNORMAL LOW (ref 12.0–15.0)
MCH: 28.8 pg (ref 26.0–34.0)
RBC: 3.64 MIL/uL — ABNORMAL LOW (ref 3.87–5.11)

## 2011-12-12 LAB — SURGICAL PCR SCREEN
MRSA, PCR: NEGATIVE
Staphylococcus aureus: NEGATIVE

## 2011-12-12 LAB — HCG, SERUM, QUALITATIVE: Preg, Serum: NEGATIVE

## 2011-12-12 LAB — BASIC METABOLIC PANEL
BUN: 13 mg/dL (ref 6–23)
Chloride: 100 mEq/L (ref 96–112)
GFR calc Af Amer: >90 mL/min (ref >90)
Potassium: 3.8 mEq/L (ref 3.5–5.1)
Sodium: 138 mEq/L (ref 135–145)

## 2011-12-12 NOTE — Assessment & Plan Note (Signed)
Pt for lap total abdominal colectomy with ileal pouch anal anastamosis vs total proctocolectomy with end ileostomy. Going to pre op today Will need ostomy marking on right Reviewed risks of procedure and surgical plan.  Pt will polyps throughout colon.

## 2011-12-12 NOTE — Progress Notes (Signed)
Chief Complaint  Patient presents with  . Pre-op Exam    Consult    HISTORY: Patient presents for preoperative history and physical. She was diagnosed with a low rectal cancer when she had rectal bleeding and some constipation-type symptoms. On her colonoscopy she was found to have greater than 20 polyps scattered throughout her colon. She has undergone neoadjuvant chemoradiation for her rectal cancer. Staging studies continue to show no evidence of metastatic disease. She had some significant perineal pain and burning during her radiation. This is mostly resolved. She is not having any nausea or vomiting. She has had some recent nasal congestion. She denies cough. She felt as though this is getting better.  Past Medical History  Diagnosis Date  . Anemia   . Diarrhea   . Rectal cancer 08/16/2011  . Colon cancer   . Anxiety   . Bright red rectal bleeding 09/13/2011  . History of chemotherapy     completed 09/2011     Past Surgical History  Procedure Date  . Tubal ligation   . Carpal tunnel release   . Colonoscopy 07/26/2011    Procedure: COLONOSCOPY;  Surgeon: Sandi M Fields, MD;  [facility];  Service: Endoscopy;  Laterality: N/A;  10:40  . Esophagogastroduodenoscopy 07/26/2011    Procedure: ESOPHAGOGASTRODUODENOSCOPY (EGD);  Surgeon: Sandi M Fields, MD;  Location: [facility];  Service: Endoscopy;  Laterality: N/A;    Current Outpatient Prescriptions  Medication Sig Dispense Refill  . Calcium Carb-Cholecalciferol (CALCIUM 500 +D) 500-400 MG-UNIT TABS Take 1 tablet by mouth every morning.       . capecitabine (XELODA) 150 MG tablet Take by mouth 2 (two) times daily after a meal.       . capecitabine (XELODA) 500 MG tablet Take by mouth 2 (two) times daily after a meal.       . diphenoxylate-atropine (LOMOTIL) 2.5-0.025 MG per tablet Take 1 tablet by mouth 4 (four) times daily as needed. For stomach      . Multiple Vitamin (MULTIVITAMIN) tablet Take 1 tablet by mouth  every morning.       . oxyCODONE-acetaminophen (PERCOCET) 5-325 MG per tablet Take 1 tablet by mouth every 4 (four) hours as needed. For pain      . polysaccharide iron (NIFEREX) 150 MG CAPS capsule Take 150 mg by mouth every morning.       . simethicone (MYLICON) 80 MG chewable tablet Chew 80 mg by mouth every 6 (six) hours as needed. For gas         No Known Allergies   Family History  Problem Relation Age of Onset  . Diabetes Mother   . Colon cancer Neg Hx   . Cancer Brother 35    rectal cancer lives in [place]     History   Social History  . Marital Status: Married    Spouse Name: N/A    Number of Children: 2  . Years of Education: N/A   Occupational History  . self employed, [employer]    Social History Main Topics  . Smoking status: Never Smoker   . Smokeless tobacco: Never Used  . Alcohol Use: No  . Drug Use: No  . Sexually Active: Yes   Other Topics Concern  . None   Social History Narrative  . None     REVIEW OF SYSTEMS - PERTINENT POSITIVES ONLY: 12 point review of systems negative other than HPI and PMH except for some mild runny nose.   EXAM:   Filed Vitals:   12/12/11 1055  BP: 114/68  Pulse: 66  Temp: 97.4 F (36.3 C)  Resp: 18    Gen:  No acute distress.  Well nourished and well groomed.   Neurological: Alert and oriented to person, place, and time. Coordination normal.  Head: Normocephalic and atraumatic.  Eyes: Conjunctivae are normal. Pupils are equal, round, and reactive to light. No scleral icterus.  Neck: Normal range of motion. Neck supple. No tracheal deviation or thyromegaly present.  Cardiovascular: Normal rate, regular rhythm, normal heart sounds and intact distal pulses.  Exam reveals no gallop and no friction rub.  No murmur heard. Respiratory: Effort normal.  No respiratory distress. No chest wall tenderness. Breath sounds normal.  No wheezes, rales or rhonchi.  GI: Soft. Bowel sounds are normal. The abdomen is soft and  nontender.  There is no rebound and no guarding.  Rectal exam not repeated today.  On last exam, it seemed to have receeded from sphincter. Musculoskeletal: Normal range of motion. Extremities are nontender.  Lymphadenopathy: No cervical, preauricular, postauricular or axillary adenopathy is present Skin: Skin is warm and dry. No rash noted. No diaphoresis. No erythema. No pallor. No clubbing, cyanosis, or edema.   Psychiatric: Normal mood and affect. Behavior is normal. Judgment and thought content normal.    LABORATORY RESULTS: Available labs are reviewed (see today/s labs in epic)    RADIOLOGY RESULTS: See E-Chart or I-Site for most recent results.  Images and reports are reviewed. Chest CT2/12/2011 IMPRESSION:  1. No acute process or evidence of metastatic disease in the chest.  2. Similar indeterminate right thyroid nodule. Consider further  characterization with thyroid ultrasound.   Abd/pelvis CT same date. IMPRESSION:  1. Response to therapy of anorectal primary and adjacent lymph  nodes. No evidence of residual or metastatic disease.  2. Mild nonspecific enhancement within the lower anus. This could  be treatment related and likely corresponds to mild hypermetabolism  at PET. This should be amendable to physical exam.  3. Prominent gonadal veins, as can be seen with pelvic congestion  syndrome.  4. Similar bilateral sacroiliac joint sclerosis. Correlate with  risk factors for sacroiliitis.    ASSESSMENT AND PLAN: Rectal cancer Pt for lap total abdominal colectomy with ileal pouch anal anastamosis vs total proctocolectomy with end ileostomy. Going to pre op today Will need ostomy marking on right Reviewed risks of procedure and surgical plan.  Pt will polyps throughout colon.      Alesa Echevarria L Zaylyn Bergdoll MD Surgical Oncology, General and Endocrine Surgery [facility]      Visit Diagnoses: 1. Rectal cancer     Primary Care  Physician: VYAS,DHRUV B., MD, MD    

## 2011-12-12 NOTE — Consult Note (Signed)
WOC consult Note Reason for Consult:Preoperative stoma site selection per Dr. Arita Miss request. Stoma site selection for right sided ileostomy: Patient's abdomen observed in the sitting, lying and standing position.  Site selected is within the abdominal rectus muscle, 5 cm to right of the umbilicus and 1 cm below.  Patient and husband understand the role of the WOC Nurse in the post-operative course.  NB:  Patient's husband had a temporary ileostomy approximately 5 years ago with take down and a complicated reanastomosis requiring an ICU stay. SUrgery date is Thursday, 3/21.  Marking is done with a surgical marking pen and then covered with a thin film transparent dressing. I will follow with you.                  Thanks, Ladona Mow, MSN, RN, [facility], CWOCN [id number])

## 2011-12-12 NOTE — Pre-Procedure Instructions (Signed)
12/12/11 Jacki Cones McNichols RN  called for ileostomy marking at time of preop visit.

## 2011-12-12 NOTE — Patient Instructions (Signed)
20 Renee Nicholson  12/12/2011   Your procedure is scheduled on:  12/14/11 [id number]  Report to [facility] at 0515 AM.  Call this number if you have problems the morning of surgery: [phone]   Remember: One Day Bowel Prep per office    Do not eat food:After Midnight.  May have clear liquids:until Midnight .   Take these medicines the morning of surgery with A SIP OF WATER:    Do not wear jewelry, make-up or nail polish.  Do not wear lotions, powders, or perfumes.  Do not shave 48 hours prior to surgery.  Do not bring valuables to the hospital.  Contacts, dentures or bridgework may not be worn into surgery.  Leave suitcase in the car. After surgery it may be brought to your room.  For patients admitted to the hospital, checkout time is 11:00 AM the day of discharge.    Special Instructions: CHG Shower Use Special Wash: 1/2 bottle night before surgery and 1/2 bottle morning of surgery. shower chin to toes with CHG.  Wash face and private parts with regular soap.    Please read over the following fact sheets that you were given: MRSA Information, Blood Transfusion Fact Sheet, coughing and deep breathing exercises

## 2011-12-12 NOTE — Pre-Procedure Instructions (Signed)
12/12/11 Patient signed form for husband and family to be interpreters at time of preop visit and surgery .  Form placed on chart.  Pt understands quite a bit of English . Reading English is limited.

## 2011-12-12 NOTE — Patient Instructions (Signed)
Take bowel prep tomorrow.  Clear liquids only tomorrow after lunch.  Get right sided ileostomy marking at pre op appt.  See you Thursday.  Take mucinex (over the counter) to thin secretions)

## 2011-12-13 ENCOUNTER — Encounter: Payer: Self-pay | Admitting: Oncology

## 2011-12-14 ENCOUNTER — Inpatient Hospital Stay (HOSPITAL_COMMUNITY): Payer: BC Managed Care – PPO | Admitting: Anesthesiology

## 2011-12-14 ENCOUNTER — Encounter (HOSPITAL_COMMUNITY): Payer: Self-pay | Admitting: *Deleted

## 2011-12-14 ENCOUNTER — Encounter (HOSPITAL_COMMUNITY)
Admission: RE | Disposition: A | Discharge status: Home Health | Payer: Self-pay | Source: Ambulatory Visit | Attending: General Surgery

## 2011-12-14 ENCOUNTER — Encounter (HOSPITAL_COMMUNITY): Payer: Self-pay | Admitting: Anesthesiology

## 2011-12-14 ENCOUNTER — Inpatient Hospital Stay (HOSPITAL_COMMUNITY)
Admission: RE | Admit: 2011-12-14 | Discharge: 2012-01-09 | DRG: 585 | Disposition: A | Discharge status: Home Health | Payer: BC Managed Care – PPO | Source: Ambulatory Visit | Attending: General Surgery | Admitting: General Surgery

## 2011-12-14 DIAGNOSIS — R5381 Other malaise: Secondary | ICD-10-CM | POA: Diagnosis not present

## 2011-12-14 DIAGNOSIS — K208 Other esophagitis without bleeding: Secondary | ICD-10-CM | POA: Diagnosis not present

## 2011-12-14 DIAGNOSIS — C20 Malignant neoplasm of rectum: Secondary | ICD-10-CM

## 2011-12-14 DIAGNOSIS — Y832 Surgical operation with anastomosis, bypass or graft as the cause of abnormal reaction of the patient, or of later complication, without mention of misadventure at the time of the procedure: Secondary | ICD-10-CM | POA: Diagnosis not present

## 2011-12-14 DIAGNOSIS — E871 Hypo-osmolality and hyponatremia: Secondary | ICD-10-CM | POA: Diagnosis not present

## 2011-12-14 DIAGNOSIS — R079 Chest pain, unspecified: Secondary | ICD-10-CM | POA: Diagnosis not present

## 2011-12-14 DIAGNOSIS — R0609 Other forms of dyspnea: Secondary | ICD-10-CM | POA: Diagnosis not present

## 2011-12-14 DIAGNOSIS — R34 Anuria and oliguria: Secondary | ICD-10-CM | POA: Diagnosis not present

## 2011-12-14 DIAGNOSIS — Z9221 Personal history of antineoplastic chemotherapy: Secondary | ICD-10-CM

## 2011-12-14 DIAGNOSIS — K651 Peritoneal abscess: Secondary | ICD-10-CM | POA: Diagnosis not present

## 2011-12-14 DIAGNOSIS — R0682 Tachypnea, not elsewhere classified: Secondary | ICD-10-CM | POA: Diagnosis not present

## 2011-12-14 DIAGNOSIS — D126 Benign neoplasm of colon, unspecified: Secondary | ICD-10-CM | POA: Diagnosis present

## 2011-12-14 DIAGNOSIS — R000 Tachycardia, unspecified: Secondary | ICD-10-CM | POA: Diagnosis not present

## 2011-12-14 DIAGNOSIS — R509 Fever, unspecified: Secondary | ICD-10-CM | POA: Diagnosis not present

## 2011-12-14 DIAGNOSIS — R0989 Other specified symptoms and signs involving the circulatory and respiratory systems: Secondary | ICD-10-CM | POA: Diagnosis not present

## 2011-12-14 DIAGNOSIS — D62 Acute posthemorrhagic anemia: Secondary | ICD-10-CM | POA: Diagnosis not present

## 2011-12-14 DIAGNOSIS — R1310 Dysphagia, unspecified: Secondary | ICD-10-CM | POA: Diagnosis not present

## 2011-12-14 DIAGNOSIS — Z01812 Encounter for preprocedural laboratory examination: Secondary | ICD-10-CM

## 2011-12-14 DIAGNOSIS — R12 Heartburn: Secondary | ICD-10-CM | POA: Diagnosis not present

## 2011-12-14 DIAGNOSIS — R0602 Shortness of breath: Secondary | ICD-10-CM | POA: Diagnosis not present

## 2011-12-14 DIAGNOSIS — E0789 Other specified disorders of thyroid: Secondary | ICD-10-CM | POA: Diagnosis present

## 2011-12-14 DIAGNOSIS — R142 Eructation: Secondary | ICD-10-CM | POA: Diagnosis not present

## 2011-12-14 DIAGNOSIS — E875 Hyperkalemia: Secondary | ICD-10-CM | POA: Diagnosis not present

## 2011-12-14 DIAGNOSIS — Z923 Personal history of irradiation: Secondary | ICD-10-CM

## 2011-12-14 DIAGNOSIS — R141 Gas pain: Secondary | ICD-10-CM | POA: Diagnosis not present

## 2011-12-14 DIAGNOSIS — E079 Disorder of thyroid, unspecified: Secondary | ICD-10-CM

## 2011-12-14 DIAGNOSIS — T8140XA Infection following a procedure, unspecified, initial encounter: Secondary | ICD-10-CM | POA: Diagnosis not present

## 2011-12-14 DIAGNOSIS — E861 Hypovolemia: Secondary | ICD-10-CM | POA: Diagnosis not present

## 2011-12-14 HISTORY — PX: LAPAROSCOPIC COLON RESECTION: SUR791

## 2011-12-14 LAB — CBC
HCT: 23.6 % — ABNORMAL LOW (ref 36.0–46.0)
Hemoglobin: 8.1 g/dL — ABNORMAL LOW (ref 12.0–15.0)
MCH: 29.3 pg (ref 26.0–34.0)
MCV: 85.5 fL (ref 78.0–100.0)
Platelets: 129 10*3/uL — ABNORMAL LOW (ref 150–400)
RBC: 2.76 MIL/uL — ABNORMAL LOW (ref 3.87–5.11)
WBC: 10.3 10*3/uL (ref 4.0–10.5)

## 2011-12-14 LAB — CREATININE, SERUM: GFR calc Af Amer: >90 mL/min (ref >90)

## 2011-12-14 SURGERY — LAPAROSCOPIC TOTAL ABDOMINAL COLECTOMY
Anesthesia: General | Wound class: Clean Contaminated

## 2011-12-14 MED ORDER — MORPHINE SULFATE (PF) 1 MG/ML IV SOLN
INTRAVENOUS | Status: AC
  Administered 2011-12-14: 1 mg
  Filled 2011-12-14: qty 25

## 2011-12-14 MED ORDER — KCL IN DEXTROSE-NACL 20-5-0.45 MEQ/L-%-% IV SOLN
INTRAVENOUS | Status: DC
  Administered 2011-12-14: 100 mL/h via INTRAVENOUS
  Administered 2011-12-15 – 2011-12-16 (×2): via INTRAVENOUS
  Administered 2011-12-17: 100 mL/h via INTRAVENOUS
  Filled 2011-12-14 (×8): qty 1000

## 2011-12-14 MED ORDER — ROCURONIUM BROMIDE 100 MG/10ML IV SOLN
INTRAVENOUS | Status: DC | PRN
  Administered 2011-12-14: 20 mg via INTRAVENOUS
  Administered 2011-12-14 (×3): 10 mg via INTRAVENOUS
  Administered 2011-12-14: 50 mg via INTRAVENOUS
  Administered 2011-12-14 (×4): 10 mg via INTRAVENOUS

## 2011-12-14 MED ORDER — DEXAMETHASONE SODIUM PHOSPHATE 10 MG/ML IJ SOLN
INTRAMUSCULAR | Status: DC | PRN
  Administered 2011-12-14: 10 mg via INTRAVENOUS

## 2011-12-14 MED ORDER — ACETAMINOPHEN 10 MG/ML IV SOLN
INTRAVENOUS | Status: DC | PRN
  Administered 2011-12-14: 1000 mg via INTRAVENOUS

## 2011-12-14 MED ORDER — ONDANSETRON HCL 4 MG PO TABS: 4.0000 mg | ORAL_TABLET | Freq: Four times a day (QID) | ORAL | Status: DC | PRN

## 2011-12-14 MED ORDER — KETOROLAC TROMETHAMINE 15 MG/ML IJ SOLN
15.0000 mg | Freq: Four times a day (QID) | INTRAMUSCULAR | Status: AC | PRN
  Administered 2011-12-14: 15 mg via INTRAVENOUS

## 2011-12-14 MED ORDER — KETOROLAC TROMETHAMINE 15 MG/ML IJ SOLN
15.0000 mg | Freq: Four times a day (QID) | INTRAMUSCULAR | Status: AC
  Administered 2011-12-14 – 2011-12-15 (×4): 15 mg via INTRAVENOUS
  Filled 2011-12-14 (×3): qty 1

## 2011-12-14 MED ORDER — KETOROLAC TROMETHAMINE 15 MG/ML IJ SOLN
INTRAMUSCULAR | Status: AC
  Filled 2011-12-14: qty 1

## 2011-12-14 MED ORDER — ACETAMINOPHEN 10 MG/ML IV SOLN
1000.0000 mg | Freq: Four times a day (QID) | INTRAVENOUS | Status: AC
  Administered 2011-12-14 – 2011-12-15 (×4): 1000 mg via INTRAVENOUS
  Filled 2011-12-14 (×4): qty 100

## 2011-12-14 MED ORDER — BUPIVACAINE LIPOSOME 1.3 % IJ SUSP
INTRAMUSCULAR | Status: DC | PRN
  Administered 2011-12-14: 20 mL

## 2011-12-14 MED ORDER — ONDANSETRON HCL 4 MG/2ML IJ SOLN
4.0000 mg | Freq: Four times a day (QID) | INTRAMUSCULAR | Status: DC | PRN
  Administered 2011-12-14 – 2011-12-17 (×3): 4 mg via INTRAVENOUS
  Filled 2011-12-14 (×5): qty 2

## 2011-12-14 MED ORDER — MIDAZOLAM HCL 5 MG/5ML IJ SOLN
INTRAMUSCULAR | Status: DC | PRN
  Administered 2011-12-14 (×2): 1 mg via INTRAVENOUS

## 2011-12-14 MED ORDER — HYDROMORPHONE HCL PF 1 MG/ML IJ SOLN
0.2500 mg | INTRAMUSCULAR | Status: DC | PRN
  Administered 2011-12-14 (×2): 0.25 mg via INTRAVENOUS

## 2011-12-14 MED ORDER — SODIUM CHLORIDE 0.9 % IV SOLN
INTRAVENOUS | Status: AC
  Filled 2011-12-14: qty 1

## 2011-12-14 MED ORDER — DIPHENHYDRAMINE HCL 12.5 MG/5ML PO ELIX: 12.5000 mg | ORAL_SOLUTION | Freq: Four times a day (QID) | ORAL | Status: DC | PRN

## 2011-12-14 MED ORDER — ALVIMOPAN 12 MG PO CAPS
12.0000 mg | ORAL_CAPSULE | Freq: Once | ORAL | Status: AC
  Administered 2011-12-14: 12 mg via ORAL

## 2011-12-14 MED ORDER — LACTATED RINGERS IR SOLN
Status: DC | PRN
  Administered 2011-12-14: 3000 mL

## 2011-12-14 MED ORDER — MORPHINE SULFATE (PF) 1 MG/ML IV SOLN
INTRAVENOUS | Status: DC
  Administered 2011-12-14: 1 mg via INTRAVENOUS
  Administered 2011-12-15: 4.5 mg via INTRAVENOUS
  Administered 2011-12-16: 24 mg via INTRAVENOUS
  Administered 2011-12-16: 4.5 mg via INTRAVENOUS
  Administered 2011-12-16: 14:00:00 via INTRAVENOUS
  Administered 2011-12-16: 4.5 mg via INTRAVENOUS
  Administered 2011-12-16: 14.18 mg via INTRAVENOUS
  Administered 2011-12-16: 4.5 mg via INTRAVENOUS
  Administered 2011-12-17: 9 mg via INTRAVENOUS
  Administered 2011-12-17: 05:00:00 via INTRAVENOUS
  Administered 2011-12-17: 9 mg via INTRAVENOUS
  Administered 2011-12-17: 4.5 mg via INTRAVENOUS
  Administered 2011-12-17: 23:00:00 via INTRAVENOUS
  Administered 2011-12-17: 4.5 mg via INTRAVENOUS
  Administered 2011-12-17: 4.34 mg via INTRAVENOUS
  Administered 2011-12-18: 4.5 mg via INTRAVENOUS
  Administered 2011-12-18 (×2): 3 mg via INTRAVENOUS
  Administered 2011-12-18: 4.5 mg via INTRAVENOUS
  Administered 2011-12-18: 1.3 mg via INTRAVENOUS
  Administered 2011-12-19: 3 mg via INTRAVENOUS
  Administered 2011-12-19: 1 mg via INTRAVENOUS
  Administered 2011-12-20: 1.5 mg via INTRAVENOUS
  Administered 2011-12-20: 3 mg via INTRAVENOUS
  Administered 2011-12-20: 1.5 mg via INTRAVENOUS
  Filled 2011-12-14 (×6): qty 25

## 2011-12-14 MED ORDER — ONDANSETRON HCL 4 MG/2ML IJ SOLN
INTRAMUSCULAR | Status: DC | PRN
  Administered 2011-12-14: 4 mg via INTRAVENOUS

## 2011-12-14 MED ORDER — NEOSTIGMINE METHYLSULFATE 1 MG/ML IJ SOLN
INTRAMUSCULAR | Status: DC | PRN
  Administered 2011-12-14: 4 mg via INTRAVENOUS

## 2011-12-14 MED ORDER — PROPOFOL 10 MG/ML IV BOLUS
INTRAVENOUS | Status: DC | PRN
  Administered 2011-12-14: 100 mg via INTRAVENOUS

## 2011-12-14 MED ORDER — SODIUM CHLORIDE 0.9 % IJ SOLN
INTRAMUSCULAR | Status: DC | PRN
  Administered 2011-12-14: 20 mL via INTRAVENOUS

## 2011-12-14 MED ORDER — BUPIVACAINE LIPOSOME 1.3 % IJ SUSP
20.0000 mL | Freq: Once | INTRAMUSCULAR | Status: DC
  Filled 2011-12-14: qty 20

## 2011-12-14 MED ORDER — NALOXONE HCL 0.4 MG/ML IJ SOLN: 0.4000 mg | INTRAMUSCULAR | Status: DC | PRN

## 2011-12-14 MED ORDER — PROMETHAZINE HCL 25 MG/ML IJ SOLN: 6.2500 mg | INTRAMUSCULAR | Status: DC | PRN

## 2011-12-14 MED ORDER — DIPHENOXYLATE-ATROPINE 2.5-0.025 MG PO TABS: 1.0000 | ORAL_TABLET | Freq: Four times a day (QID) | ORAL | Status: DC | PRN

## 2011-12-14 MED ORDER — HYDROMORPHONE HCL PF 1 MG/ML IJ SOLN
INTRAMUSCULAR | Status: AC
  Filled 2011-12-14: qty 1

## 2011-12-14 MED ORDER — KETOROLAC TROMETHAMINE 30 MG/ML IJ SOLN: 15.0000 mg | Freq: Once | INTRAMUSCULAR | Status: DC | PRN

## 2011-12-14 MED ORDER — 0.9 % SODIUM CHLORIDE (POUR BTL) OPTIME
TOPICAL | Status: DC | PRN
  Administered 2011-12-14: 2000 mL

## 2011-12-14 MED ORDER — SODIUM CHLORIDE 0.9 % IJ SOLN: 9.0000 mL | INTRAMUSCULAR | Status: DC | PRN

## 2011-12-14 MED ORDER — SODIUM CHLORIDE 0.9 % IV SOLN
1.0000 g | INTRAVENOUS | Status: AC
  Administered 2011-12-15: 1 g via INTRAVENOUS
  Filled 2011-12-14: qty 1

## 2011-12-14 MED ORDER — LACTATED RINGERS IV SOLN
INTRAVENOUS | Status: DC | PRN
  Administered 2011-12-14 (×4): via INTRAVENOUS

## 2011-12-14 MED ORDER — BUPIVACAINE 0.25 % ON-Q PUMP DUAL CATH 300 ML
300.0000 mL | INJECTION | Status: DC
  Filled 2011-12-14: qty 300

## 2011-12-14 MED ORDER — FENTANYL CITRATE 0.05 MG/ML IJ SOLN
INTRAMUSCULAR | Status: DC | PRN
  Administered 2011-12-14 (×15): 50 ug via INTRAVENOUS

## 2011-12-14 MED ORDER — ENOXAPARIN SODIUM 40 MG/0.4ML ~~LOC~~ SOLN
40.0000 mg | SUBCUTANEOUS | Status: DC
  Administered 2011-12-15 – 2012-01-04 (×20): 40 mg via SUBCUTANEOUS
  Filled 2011-12-14 (×23): qty 0.4

## 2011-12-14 MED ORDER — ACETAMINOPHEN 10 MG/ML IV SOLN
INTRAVENOUS | Status: AC
  Filled 2011-12-14: qty 100

## 2011-12-14 MED ORDER — GLYCOPYRROLATE 0.2 MG/ML IJ SOLN
INTRAMUSCULAR | Status: DC | PRN
  Administered 2011-12-14: .5 mg via INTRAVENOUS

## 2011-12-14 MED ORDER — OXYCODONE-ACETAMINOPHEN 5-325 MG PO TABS: 1.0000 | ORAL_TABLET | ORAL | Status: DC | PRN

## 2011-12-14 MED ORDER — SIMETHICONE 80 MG PO CHEW
80.0000 mg | CHEWABLE_TABLET | Freq: Four times a day (QID) | ORAL | Status: DC | PRN
  Administered 2011-12-15 – 2011-12-16 (×4): 80 mg via ORAL
  Filled 2011-12-14 (×3): qty 1

## 2011-12-14 MED ORDER — DROPERIDOL 2.5 MG/ML IJ SOLN
INTRAMUSCULAR | Status: DC | PRN
  Administered 2011-12-14: 0.625 mg via INTRAVENOUS

## 2011-12-14 MED ORDER — DIPHENHYDRAMINE HCL 50 MG/ML IJ SOLN: 12.5000 mg | Freq: Four times a day (QID) | INTRAMUSCULAR | Status: DC | PRN

## 2011-12-14 MED ORDER — HETASTARCH-ELECTROLYTES 6 % IV SOLN
INTRAVENOUS | Status: DC | PRN
  Administered 2011-12-14: 09:00:00 via INTRAVENOUS

## 2011-12-14 MED ORDER — ALVIMOPAN 12 MG PO CAPS
12.0000 mg | ORAL_CAPSULE | Freq: Two times a day (BID) | ORAL | Status: DC
  Administered 2011-12-15 – 2011-12-16 (×3): 12 mg via ORAL
  Filled 2011-12-14 (×6): qty 1

## 2011-12-14 MED ORDER — SODIUM CHLORIDE 0.9 % IV SOLN
1.0000 g | INTRAVENOUS | Status: AC
  Administered 2011-12-14: 1 g via INTRAVENOUS

## 2011-12-14 MED ORDER — ONDANSETRON HCL 4 MG/2ML IJ SOLN
4.0000 mg | Freq: Four times a day (QID) | INTRAMUSCULAR | Status: DC | PRN
  Administered 2011-12-15 – 2011-12-16 (×2): 4 mg via INTRAVENOUS

## 2011-12-14 SURGICAL SUPPLY — 108 items
APPLICATOR COTTON TIP 6IN STRL (MISCELLANEOUS) ×4 IMPLANT
BAG URINE DRAINAGE (UROLOGICAL SUPPLIES) ×2 IMPLANT
BLADE EXTENDED COATED 6.5IN (ELECTRODE) ×4 IMPLANT
BLADE HEX COATED 2.75 (ELECTRODE) ×4 IMPLANT
BLADE SURG 15 STRL LF DISP TIS (BLADE) ×2 IMPLANT
BLADE SURG 15 STRL SS (BLADE) ×4
BLADE SURG SZ10 CARB STEEL (BLADE) ×8 IMPLANT
CANISTER SUCTION 2500CC (MISCELLANEOUS) ×2 IMPLANT
CATH FOLEY 2WAY SLVR  5CC 24FR (CATHETERS) ×1
CATH FOLEY 2WAY SLVR 5CC 24FR (CATHETERS) ×1 IMPLANT
CLIP TI LARGE 6 (CLIP) IMPLANT
CLOTH BEACON ORANGE TIMEOUT ST (SAFETY) ×2 IMPLANT
COVER SURGICAL LIGHT HANDLE (MISCELLANEOUS) IMPLANT
DECANTER SPIKE VIAL GLASS SM (MISCELLANEOUS) ×2 IMPLANT
DRAIN CHANNEL RND F F (WOUND CARE) ×2 IMPLANT
DRAIN PENROSE 18X1/2 LTX STRL (DRAIN) IMPLANT
DRAPE LAPAROSCOPIC ABDOMINAL (DRAPES) ×2 IMPLANT
DRAPE LAPAROTOMY T 102X78X121 (DRAPES) IMPLANT
DRAPE LG THREE QUARTER DISP (DRAPES) ×6 IMPLANT
DRAPE TABLE BACK 44X90 PK DISP (DRAPES) ×2 IMPLANT
DRAPE WARM FLUID 44X44 (DRAPE) ×4 IMPLANT
DRSG PAD ABDOMINAL 8X10 ST (GAUZE/BANDAGES/DRESSINGS) IMPLANT
DRSG TEGADERM 2-3/8X2-3/4 SM (GAUZE/BANDAGES/DRESSINGS) ×8 IMPLANT
DRSG TEGADERM 4X4.75 (GAUZE/BANDAGES/DRESSINGS) ×6 IMPLANT
ELECT REM PT RETURN 9FT ADLT (ELECTROSURGICAL) ×2
ELECTRODE REM PT RTRN 9FT ADLT (ELECTROSURGICAL) ×1 IMPLANT
ENDOLOOP SUT PDS II  0 18 (SUTURE) ×2
ENDOLOOP SUT PDS II 0 18 (SUTURE) ×2 IMPLANT
EVACUATOR DRAINAGE 10X20 100CC (DRAIN) IMPLANT
EVACUATOR DRAINAGE 7X20 100CC (MISCELLANEOUS) ×1 IMPLANT
EVACUATOR SILICONE 100CC (DRAIN)
EVACUATOR SILICONE 100CC (MISCELLANEOUS) ×2
GAUZE SPONGE 2X2 8PLY STRL LF (GAUZE/BANDAGES/DRESSINGS) ×1 IMPLANT
GAUZE SPONGE 4X4 16PLY XRAY LF (GAUZE/BANDAGES/DRESSINGS) ×2 IMPLANT
GLOVE BIO SURGEON STRL SZ 6 (GLOVE) ×4 IMPLANT
GLOVE BIOGEL PI IND STRL 6.5 (GLOVE) ×1 IMPLANT
GLOVE BIOGEL PI IND STRL 7.0 (GLOVE) ×1 IMPLANT
GLOVE BIOGEL PI INDICATOR 6.5 (GLOVE) ×1
GLOVE BIOGEL PI INDICATOR 7.0 (GLOVE) ×1
GLOVE ECLIPSE 8.0 STRL XLNG CF (GLOVE) ×4 IMPLANT
GLOVE INDICATOR 6.5 STRL GRN (GLOVE) ×4 IMPLANT
GLOVE INDICATOR 8.0 STRL GRN (GLOVE) ×4 IMPLANT
GOWN PREVENTION PLUS XLARGE (GOWN DISPOSABLE) ×2 IMPLANT
GOWN PREVENTION PLUS XXLARGE (GOWN DISPOSABLE) ×4 IMPLANT
GOWN STRL NON-REIN LRG LVL3 (GOWN DISPOSABLE) ×4 IMPLANT
GOWN STRL REIN XL XLG (GOWN DISPOSABLE) ×4 IMPLANT
HAND ACTIVATED (MISCELLANEOUS) ×2 IMPLANT
KIT BASIN OR (CUSTOM PROCEDURE TRAY) ×4 IMPLANT
LEGGING LITHOTOMY PAIR STRL (DRAPES) ×2 IMPLANT
LIGASURE IMPACT 36 18CM CVD LR (INSTRUMENTS) IMPLANT
NDL SAFETY ECLIPSE 18X1.5 (NEEDLE) IMPLANT
NEEDLE HYPO 18GX1.5 SHARP (NEEDLE)
NEEDLE HYPO 22GX1.5 SAFETY (NEEDLE) ×2 IMPLANT
NS IRRIG 1000ML POUR BTL (IV SOLUTION) ×4 IMPLANT
PACK BASIC VI WITH GOWN DISP (CUSTOM PROCEDURE TRAY) ×2 IMPLANT
PACK GENERAL/GYN (CUSTOM PROCEDURE TRAY) ×4 IMPLANT
PENCIL BUTTON HOLSTER BLD 10FT (ELECTRODE) ×2 IMPLANT
RELOAD PROXIMATE 75MM BLUE (ENDOMECHANICALS) ×6 IMPLANT
RETRACTOR WILSON SYSTEM (INSTRUMENTS) ×2 IMPLANT
SHEARS FOC LG CVD HARMONIC 17C (MISCELLANEOUS) ×2 IMPLANT
SPONGE GAUZE 2X2 STER 10/PKG (GAUZE/BANDAGES/DRESSINGS) ×1
SPONGE GAUZE 4X4 12PLY (GAUZE/BANDAGES/DRESSINGS) ×2 IMPLANT
SPONGE LAP 18X18 X RAY DECT (DISPOSABLE) ×2 IMPLANT
SPONGE LAP 4X18 X RAY DECT (DISPOSABLE) ×2 IMPLANT
SPONGE SURGIFOAM ABS GEL 12-7 (HEMOSTASIS) IMPLANT
STAPLER PROXIMATE 75MM BLUE (STAPLE) ×2 IMPLANT
STAPLER VISISTAT 35W (STAPLE) ×2 IMPLANT
SUCTION POOLE TIP (SUCTIONS) ×2 IMPLANT
SUT CHROMIC 2 0 SH (SUTURE) IMPLANT
SUT CHROMIC 3 0 SH 27 (SUTURE) IMPLANT
SUT ETHILON 2 0 PS N (SUTURE) ×2 IMPLANT
SUT PDS AB 0 CTX 60 (SUTURE) IMPLANT
SUT PDS AB 1 CTX 36 (SUTURE) IMPLANT
SUT PDS AB 1 TP1 96 (SUTURE) IMPLANT
SUT PDS AB 3-0 SH 27 (SUTURE) ×8 IMPLANT
SUT PROLENE 2 0 KS (SUTURE) ×2 IMPLANT
SUT PROLENE 3 0 SH 48 (SUTURE) ×2 IMPLANT
SUT SILK 2 0 (SUTURE)
SUT SILK 2 0 SH CR/8 (SUTURE) IMPLANT
SUT SILK 2 0SH CR/8 30 (SUTURE) IMPLANT
SUT SILK 2-0 18XBRD TIE 12 (SUTURE) IMPLANT
SUT SILK 2-0 30XBRD TIE 12 (SUTURE) IMPLANT
SUT SILK 3 0 (SUTURE)
SUT SILK 3 0 SH CR/8 (SUTURE) IMPLANT
SUT SILK 3-0 18XBRD TIE 12 (SUTURE) IMPLANT
SUT VIC AB 1 CTX 18 (SUTURE) IMPLANT
SUT VIC AB 2-0 CT1 18 (SUTURE) ×2 IMPLANT
SUT VIC AB 2-0 CT1 27 (SUTURE)
SUT VIC AB 2-0 CT1 36 (SUTURE) ×4 IMPLANT
SUT VIC AB 2-0 CT1 TAPERPNT 27 (SUTURE) IMPLANT
SUT VIC AB 2-0 SH 18 (SUTURE) ×2 IMPLANT
SUT VIC AB 2-0 SH 27 (SUTURE)
SUT VIC AB 2-0 SH 27X BRD (SUTURE) IMPLANT
SUT VIC AB 3-0 SH 18 (SUTURE) ×2 IMPLANT
SUT VIC AB 3-0 SH 8-18 (SUTURE) ×2 IMPLANT
SUT VIC AB 4-0 SH 18 (SUTURE) IMPLANT
SUT VICRYL 0 ENDOLOOP (SUTURE) ×2 IMPLANT
SUT VICRYL 2 0 18  UND BR (SUTURE) ×1
SUT VICRYL 2 0 18 UND BR (SUTURE) ×1 IMPLANT
SUT VICRYL 3 0 BR 18  UND (SUTURE) ×1
SUT VICRYL 3 0 BR 18 UND (SUTURE) ×1 IMPLANT
TOWEL OR 17X26 10 PK STRL BLUE (TOWEL DISPOSABLE) ×6 IMPLANT
TOWEL OR NON WOVEN STRL DISP B (DISPOSABLE) ×4 IMPLANT
TRAY FOLEY CATH 14FRSI W/METER (CATHETERS) ×2 IMPLANT
TROCAR BLADELESS OPT 5 75 (ENDOMECHANICALS) ×10 IMPLANT
TROCAR XCEL BLADELESS 5X75MML (TROCAR) ×10 IMPLANT
YANKAUER SUCT BULB TIP 10FT TU (MISCELLANEOUS) ×2 IMPLANT
YANKAUER SUCT BULB TIP NO VENT (SUCTIONS) ×2 IMPLANT

## 2011-12-14 NOTE — Transfer of Care (Signed)
Immediate Anesthesia Transfer of Care Note  Patient: Renee Nicholson  Procedure(s) Performed: Procedure(s) (LRB): LAPAROSCOPIC TOTAL ABDOMINAL COLECTOMY (N/A) ILEAL POUCH (N/A) DIVERTING ILEOSTOMY (N/A)  Patient Location: PACU  Anesthesia Type: General  Level of Consciousness: awake and patient cooperative  Airway & Oxygen Therapy: Patient Spontanous Breathing and Patient connected to face mask oxygen  Post-op Assessment: Report given to PACU RN and Post -op Vital signs reviewed and stable  Post vital signs: Reviewed and stable  Complications: No apparent anesthesia complications

## 2011-12-14 NOTE — Progress Notes (Signed)
Pt did bowel prep 12/13/11 Miralax and dulcolax and clear liq after 1300

## 2011-12-14 NOTE — Anesthesia Preprocedure Evaluation (Signed)

## 2011-12-14 NOTE — Interval H&P Note (Signed)
History and Physical Interval Note:  12/14/2011 7:39 AM  Renee Nicholson  has presented today for surgery, with the diagnosis of rectal cancer / colon polyps   The various methods of treatment have been discussed with the patient and family. After consideration of risks, benefits and other options for treatment, the patient has consented to  Procedure(s) (LRB): LAPAROSCOPIC TOTAL ABDOMINAL COLECTOMY (N/A) PROCTOSCOPY (N/A) ABDOMINAL PERINEAL RESECTION (N/A) ILEAL POUCH (N/A) DIVERTING ILEOSTOMY (N/A) PERMANENT ILEOSTOMY (N/A) as a surgical intervention .  The patients' history has been reviewed, patient examined, no change in status, stable for surgery.  I have reviewed the patients' chart and labs.  Questions were answered to the patient's satisfaction.     Jamie Belger

## 2011-12-14 NOTE — Preoperative (Signed)
Beta Blockers   Reason not to administer Beta Blockers:Not Applicable 

## 2011-12-14 NOTE — Brief Op Note (Signed)
12/14/2011  1:55 PM  PATIENT:  Renee Nicholson  46 y.o. female  PRE-OPERATIVE DIAGNOSIS:  rectal cancer / colon polyps   POST-OPERATIVE DIAGNOSIS:  rectal cancer / colon polyps   PROCEDURE:  Procedure(s) (LRB): LAPAROSCOPIC TOTAL PROCTOCOLECTOMY (N/A) ILEAL POUCH ANAL ANASTAMOSIS (N/A) DIVERTING ILEOSTOMY (N/A)  SURGEON:  Surgeon(s) and Role:    * [name], MD - Primary  ASSISTANTS:   Ardeth Sportsman, MD   ANESTHESIA:   local and general  EBL:  Total I/O In: 3700 [I.V.:3200; IV Piggyback:500] Out: 1400 [Urine:1000; Blood:400]  DRAINS: Urinary Catheter (Foley), (19) Blake drain(s) in the pelvis and 24 Fr Rectal tube with 30 cc balloon   LOCAL MEDICATIONS USED:  OTHER Exparel  SPECIMEN:  Source of Specimen:  Distal rectal margin, total proctocolectomy, additional posterior distal rectal margin  DISPOSITION OF SPECIMEN:  PATHOLOGY  COUNTS:  YES  TOURNIQUET:  * No tourniquets in log *  DICTATION: .Other Dictation: Dictation Number [id number]  PLAN OF CARE: Admit to inpatient   PATIENT DISPOSITION:  PACU - hemodynamically stable.   Delay start of Pharmacological VTE agent (>24hrs) due to surgical blood loss or risk of bleeding: not applicable

## 2011-12-14 NOTE — Anesthesia Postprocedure Evaluation (Signed)
  Anesthesia Post-op Note  Patient: Renee Nicholson  Procedure(s) Performed: Procedure(s) (LRB): LAPAROSCOPIC TOTAL ABDOMINAL COLECTOMY (N/A) ILEAL POUCH (N/A) DIVERTING ILEOSTOMY (N/A)  Patient Location: PACU  Anesthesia Type: General  Level of Consciousness: awake and alert   Airway and Oxygen Therapy: Patient Spontanous Breathing  Post-op Pain: mild  Post-op Assessment: Post-op Vital signs reviewed, Patient's Cardiovascular Status Stable, Respiratory Function Stable, Patent Airway and No signs of Nausea or vomiting  Post-op Vital Signs: stable  Complications: No apparent anesthesia complications

## 2011-12-14 NOTE — H&P (View-Only) (Signed)
Chief Complaint  Patient presents with  . Pre-op Exam    Consult    HISTORY: Patient presents for preoperative history and physical. She was diagnosed with a low rectal cancer when she had rectal bleeding and some constipation-type symptoms. On her colonoscopy she was found to have greater than 20 polyps scattered throughout her colon. She has undergone neoadjuvant chemoradiation for her rectal cancer. Staging studies continue to show no evidence of metastatic disease. She had some significant perineal pain and burning during her radiation. This is mostly resolved. She is not having any nausea or vomiting. She has had some recent nasal congestion. She denies cough. She felt as though this is getting better.  Past Medical History  Diagnosis Date  . Anemia   . Diarrhea   . Rectal cancer 08/16/2011  . Colon cancer   . Anxiety   . Bright red rectal bleeding 09/13/2011  . History of chemotherapy     completed 09/2011     Past Surgical History  Procedure Date  . Tubal ligation   . Carpal tunnel release   . Colonoscopy 07/26/2011    Procedure: COLONOSCOPY;  Surgeon: Arlyce Harman, MD;  [facility]: [facility];  Service: Endoscopy;  Laterality: N/A;  10:40  . Esophagogastroduodenoscopy 07/26/2011    Procedure: ESOPHAGOGASTRODUODENOSCOPY (EGD);  Surgeon: Arlyce Harman, MD;  [facility]: [facility];  Service: Endoscopy;  Laterality: N/A;    Current Outpatient Prescriptions  Medication Sig Dispense Refill  . Calcium Carb-Cholecalciferol (CALCIUM 500 +D) 500-400 MG-UNIT TABS Take 1 tablet by mouth every morning.       . capecitabine (XELODA) 150 MG tablet Take by mouth 2 (two) times daily after a meal.       . capecitabine (XELODA) 500 MG tablet Take by mouth 2 (two) times daily after a meal.       . diphenoxylate-atropine (LOMOTIL) 2.5-0.025 MG per tablet Take 1 tablet by mouth 4 (four) times daily as needed. For stomach      . Multiple Vitamin (MULTIVITAMIN) tablet Take 1 tablet by mouth  every morning.       Marland Kitchen oxyCODONE-acetaminophen (PERCOCET) 5-325 MG per tablet Take 1 tablet by mouth every 4 (four) hours as needed. For pain      . polysaccharide iron (NIFEREX) 150 MG CAPS capsule Take 150 mg by mouth every morning.       . simethicone (MYLICON) 80 MG chewable tablet Chew 80 mg by mouth every 6 (six) hours as needed. For gas         No Known Allergies   Family History  Problem Relation Age of Onset  . Diabetes Mother   . Colon cancer Neg Hx   . Cancer Brother 35    rectal cancer lives in [place]     History   Social History  . Marital Status: Married    Spouse Name: N/A    Number of Children: 2  . Years of Education: N/A   Occupational History  . self employed, [employer]    Social History Main Topics  . Smoking status: Never Smoker   . Smokeless tobacco: Never Used  . Alcohol Use: No  . Drug Use: No  . Sexually Active: Yes   Other Topics Concern  . None   Social History Narrative  . None     REVIEW OF SYSTEMS - PERTINENT POSITIVES ONLY: 12 point review of systems negative other than HPI and PMH except for some mild runny nose.   EXAM:  Filed Vitals:   12/12/11 1055  BP: 114/68  Pulse: 66  Temp: 97.4 F (36.3 C)  Resp: 18    Gen:  No acute distress.  Well nourished and well groomed.   Neurological: Alert and oriented to person, place, and time. Coordination normal.  Head: Normocephalic and atraumatic.  Eyes: Conjunctivae are normal. Pupils are equal, round, and reactive to light. No scleral icterus.  Neck: Normal range of motion. Neck supple. No tracheal deviation or thyromegaly present.  Cardiovascular: Normal rate, regular rhythm, normal heart sounds and intact distal pulses.  Exam reveals no gallop and no friction rub.  No murmur heard. Respiratory: Effort normal.  No respiratory distress. No chest wall tenderness. Breath sounds normal.  No wheezes, rales or rhonchi.  GI: Soft. Bowel sounds are normal. The abdomen is soft and  nontender.  There is no rebound and no guarding.  Rectal exam not repeated today.  On last exam, it seemed to have receeded from sphincter. Musculoskeletal: Normal range of motion. Extremities are nontender.  Lymphadenopathy: No cervical, preauricular, postauricular or axillary adenopathy is present Skin: Skin is warm and dry. No rash noted. No diaphoresis. No erythema. No pallor. No clubbing, cyanosis, or edema.   Psychiatric: Normal mood and affect. Behavior is normal. Judgment and thought content normal.    LABORATORY RESULTS: Available labs are reviewed (see today/s labs in epic)    RADIOLOGY RESULTS: See E-Chart or I-Site for most recent results.  Images and reports are reviewed. Chest CT2/12/2011 IMPRESSION:  1. No acute process or evidence of metastatic disease in the chest.  2. Similar indeterminate right thyroid nodule. Consider further  characterization with thyroid ultrasound.   Abd/pelvis CT same date. IMPRESSION:  1. Response to therapy of anorectal primary and adjacent lymph  nodes. No evidence of residual or metastatic disease.  2. Mild nonspecific enhancement within the lower anus. This could  be treatment related and likely corresponds to mild hypermetabolism  at PET. This should be amendable to physical exam.  3. Prominent gonadal veins, as can be seen with pelvic congestion  syndrome.  4. Similar bilateral sacroiliac joint sclerosis. Correlate with  risk factors for sacroiliitis.    ASSESSMENT AND PLAN: Rectal cancer Pt for lap total abdominal colectomy with ileal pouch anal anastamosis vs total proctocolectomy with end ileostomy. Going to pre op today Will need ostomy marking on right Reviewed risks of procedure and surgical plan.  Pt will polyps throughout colon.      Maudry Diego MD Surgical Oncology, General and Endocrine Surgery [facility]      Visit Diagnoses: 1. Rectal cancer     Primary Care  Physician: [name], MD, MD

## 2011-12-15 LAB — CBC
Hemoglobin: 7.5 g/dL — ABNORMAL LOW (ref 12.0–15.0)
MCHC: 33.8 g/dL (ref 30.0–36.0)
RDW: 15.5 % (ref 11.5–15.5)
WBC: 11.3 10*3/uL — ABNORMAL HIGH (ref 4.0–10.5)

## 2011-12-15 LAB — MAGNESIUM: Magnesium: 1.2 mg/dL — ABNORMAL LOW (ref 1.5–2.5)

## 2011-12-15 LAB — BASIC METABOLIC PANEL
CO2: 28 mEq/L (ref 19–32)
Calcium: 8.3 mg/dL — ABNORMAL LOW (ref 8.4–10.5)
Potassium: 4.1 mEq/L (ref 3.5–5.1)
Sodium: 133 mEq/L — ABNORMAL LOW (ref 135–145)

## 2011-12-15 LAB — TYPE AND SCREEN: Antibody Screen: NEGATIVE

## 2011-12-15 LAB — PHOSPHORUS: Phosphorus: 3.1 mg/dL (ref 2.3–4.6)

## 2011-12-15 MED ORDER — MAGNESIUM SULFATE 40 MG/ML IJ SOLN
4.0000 g | Freq: Once | INTRAMUSCULAR | Status: AC
  Administered 2011-12-15: 4 g via INTRAVENOUS
  Filled 2011-12-15: qty 100

## 2011-12-15 MED ORDER — PANTOPRAZOLE SODIUM 40 MG IV SOLR
40.0000 mg | INTRAVENOUS | Status: DC
  Administered 2011-12-15 – 2011-12-20 (×6): 40 mg via INTRAVENOUS
  Filled 2011-12-15 (×7): qty 40

## 2011-12-15 NOTE — Op Note (Signed)
Renee Nicholson, [place] NO.:  [id number]  MEDICAL RECORD NO.:  [id number]  LOCATION:  1525                         FACILITY:  [facility]  PHYSICIAN:  [name], MD       DATE OF BIRTH:  04-Jan-1966  DATE OF PROCEDURE:  12/14/2011 DATE OF DISCHARGE:                              OPERATIVE REPORT   PREOPERATIVE DIAGNOSES: 1. Rectal cancer, cT3 N1, s/p neoadjuvant chemoradiation 2. Colonic polyposis.  POSTOPERATIVE DIAGNOSES: Same as above.  PROCEDURE PERFORMED:  Laparoscopic total proctocolectomy with ileal pouch-anal anastomosis and diverting ileostomy, placement of rectal tube.  SURGEON:  [name], MD.  ASSISTANT:  Ardeth Sportsman, MD.  ANESTHESIA:  General and local.  FINDINGS:  Posterior rectal cancer with fibrotic area, close to anal sphincters.  Frozen section margin with some atypical cells.  Additional permanent margin was sent to pathology.  Rectal cancer distally with anal sphincters, pouch went nicely in the pelvis and reached to the pubis symphysis.  SPECIMENS: 1. Distal rectal margin. 2. Proctocolectomy. 3. Additional distal margin.  ESTIMATED BLOOD LOSS:  50 mL.  COMPLICATIONS:  None known.  PROCEDURE IN DETAIL:  Ms. Dullea was identified in the holding area and taken to the operating room, where she was placed supine on the operating table.  General anesthesia was induced.  She was placed into the low lithotomy position.  Her abdomen and perineum were prepped and draped in sterile fashion.  Time-out was performed according to the surgical safety checklist.  When all was correct, we continued.  The abdomen was accessed in the left upper quadrant in standard fashion using an Optiview port, after administration of Exparel. Pneumoperitoneum was achieved to a pressure of 15 mmHg.  The patient was placed into Trendelenburg position and rotated to the right.  Two additional ports were placed into the right abdomen, with one of them being  in the ileostomy site.  An additional port was placed in the midline just above the umbilicus and one in the left abdomen.  The sigmoid colon was pulled medially and the attachments laterally were taken down with scissors.  The patient was very thin and the ureter was seen almost immediately.  This was left laterally.  Again, the scissors were used to take down the avascular plane, but the pelvis was quite sticky from the radiation that she received.  The mesorectum was taken down sharply and the lateral rectal stalks were taken with the ENSEAL. Care was taken to push the hypogastric nerve plexus laterally on both sides.  The rectum was taken all the way down to the pelvic floor.  The IMA was ligated and endo-looped.  The surgical assistant, Dr. Michaell Cowing began taking down the splenic flexure while I went below and went ahead and did the rectal dissection.    The mass appeared to be fairly distal and just proximal to the sphincter.  The mucosa was opened.  A Harmonic Scalpel was used to go full thickness on the rectal wall and then this opened up into the peritoneal cavity eventually.  Care was taken in taking the vagina off anteriorly.  The [place] retractor was used to assist with visualization.  Stay sutures were placed  in all 4 quadrants.  Once adequate length was pulled down from the rectum, a margin was sent from the very end to determine whether there was still cancer at the margin. The distal rectum was pulled through the anal sphincters and held with a bowel clamp in place.  This prevented the pneumoperitoneum from leaking.    The remainder of the colonic attachments were taken down with the ENSEAL.  The colonic mesentery was highly ligated and the named vessels were secured with Endoloop.  The terminal ileum was transected with a stapler.  Once the entire mesentery was freed up, the colon was pulled out completely through the anus.  At this point, the anus was plugged with a  laparotomy sponge while the dissection for the ileal pouch was done.  The ileostomy site was opened, and the distal small bowel pulled through the ileostomy site.  A pouch was created at 15 cm in length.  This was done by folding the bowel upon itself and securing it with sutures.  The tip of the pouch was opened and 3 loads of the GIA stapler were used to free the pouch.  In order to get more length, an additional 10 cm of small bowel was transected.  After the pouch was made, the tip of the pouch was oversewn and the mesentery was closed.  The staple line was examined and there was not seem to be any hemorrhage.  The pouch was pulled back through into the abdomen and placed on the pelvis.  It was seen that it would not quite reach and so additional length was obtained by dividing some of the mesentery.  This allowed [place] to pull bowel more distally.  At this point,  care was taken to examine the mesentery and make sure that it was not twisted.  An appropriate loop of bowel for the ileostomy was identified and pulled out through the ileostomy site.  A 19-Blake drain was placed in the pelvis.  The orientation was rechecked and the vagina was rechecked.    The anal anastomosis was created by securing the pouch with four 3-0 PDS sutures at the anterior, posterior, left, and right aspect; in between this quadrants, this was run with 2-0 Vicryl.  A rectal tube was placed into the pouch and inflated up of 30 cc of saline. This was secured to the leg.  The skin incisions of the 5 ports were closed with a 4-0 Monocryl.  The ileostomy was matured with 3-0 Vicryl pops.  Care was taken to orient the proximal end superiorly and the distal end inferiorly.  An ostomy appliance placed.  The patient was awakened from the anesthesia and taken to PACU in stable condition. Needle, sponge, and instrument counts were correct x2.     [name], MD     FB/MEDQ  D:  12/14/2011  T:  12/15/2011  Job:   [id number]

## 2011-12-15 NOTE — Progress Notes (Signed)
1 Day Post-Op  Subjective: Patient doing OK this AM.  Had some nausea when she tried to get up.  Felt slightly lightheaded.  1x nausea last night.    Objective: Vital signs in last 24 hours: Temp:  [97.5 F (36.4 C)-98.2 F (36.8 C)] 98.2 F (36.8 C) (03/22 0612) Pulse Rate:  [51-72] 67  (03/22 0612) Resp:  [12-16] 13  (03/22 0741) BP: (90-125)/(52-68) 90/58 mmHg (03/22 0612) SpO2:  [95 %-100 %] 100 % (03/22 0741) Weight:  [98 lb (44.453 kg)] 98 lb (44.453 kg) (03/21 1520)    Intake/Output from previous day: 03/21 0701 - 03/22 0700 In: 5392 [I.V.:4590; IV Piggyback:802] Out: 3255 [Urine:2550; Drains:305; Blood:400] Intake/Output this shift:    General appearance: alert, cooperative and pale GI: soft, non distended, bilious ostomy output, incisions c/d/i Rectal tube sl bloody/brown  Lab Results:   Basename 12/15/11 0422 12/14/11 1613  WBC 11.3* 10.3  HGB 7.5* 8.1*  HCT 22.2* 23.6*  PLT 150 129*   BMET  Basename 12/15/11 0422 12/14/11 1613 12/12/11 1330  NA 133* -- 138  K 4.1 -- 3.8  CL 98 -- 100  CO2 28 -- 29  GLUCOSE 139* -- 168*  BUN 9 -- 13  CREATININE 0.56 0.52 --  CALCIUM 8.3* -- 9.5   PT/INR No results found for this basename: LABPROT:2,INR:2 in the last 72 hours ABG No results found for this basename: PHART:2,PCO2:2,PO2:2,HCO3:2 in the last 72 hours  Studies/Results: No results found.  Anti-infectives: Anti-infectives     Start     Dose/Rate Route Frequency Ordered Stop   12/15/11 0800   ertapenem (INVANZ) 1 g in sodium chloride 0.9 % 50 mL IVPB        1 g 100 mL/hr over 30 Minutes Intravenous Every 24 hours 12/14/11 1537 12/15/11 0812   12/14/11 0527   ertapenem (INVANZ) 1 g in sodium chloride 0.9 % 50 mL IVPB        1 g 100 mL/hr over 30 Minutes Intravenous 60 min pre-op 12/14/11 0527 12/14/11 0805          Assessment/Plan: s/p Procedure(s) (LRB): LAPAROSCOPIC TOTAL ABDOMINAL COLECTOMY (N/A) ILEAL POUCH (N/A) DIVERTING ILEOSTOMY  (N/A) Advance diet D/C foley TOMORROW. Replete hypomagnesemia. ABL anemia- recheck in AM.  Looks to be leveling off.  If stable, may not need transfusion. Watch ostomy output. Continue IVF/PCA  LOS: 1 day    [facility] 12/15/2011

## 2011-12-15 NOTE — Progress Notes (Signed)
CARE MANAGEMENT NOTE 12/15/2011  Patient:  Renee Nicholson, Renee Nicholson   Account Number:  [id number]  Date Initiated:  12/15/2011  Documentation initiated by:  Dmoni Fortson  Subjective/Objective Assessment:   46 yo female admitted 12/14/11 with rectal cancer     Action/Plan:   D/C when medically stable   Anticipated DC Date:  12/18/2011        DC Planning Services  CM consult                Status of service:  In process, will continue to follow  Comments:  12/15/11, Kathi Der RNC-MNN, BSN, [phone], CM received referral for language interpreting.  CM called and spoke to pt's nurse and explained to her that interpreters are arranged through the Social Work dept or by using [employer].

## 2011-12-15 NOTE — Consult Note (Signed)
WOC ostomy consult  Stoma type/location: RLQ, ileostomy  Stomal assessment/size: aprox. 1 5/8" budded visualized thru pouch today, only POD1. Stoma pink and moist Output: liq. Green fecal material Ostomy pouching: 1pc from OR, did not change today.  Pt very sleepy, still on PCA. WOC will see first of week for pouch change.  Left 2pc 2 1/4" in room for weekend should staff need to change prior to first WOC pouch change.  Education provided: Left ostomy teaching booklet with sisters.  Explained pouching options with 2pc lock and roll closure, demonstrated this to pt and family.  Explained that pouch would be placed downward to facilitate pt emptying into toilet.  Family and pt verbalized understanding.  Answered families questions about freq. Of pouch change and cleaning.  Will demonstrate at next visit.  WOC team will follow Renee Nicholson Renee Nicholson, [place] [phone]

## 2011-12-16 LAB — BASIC METABOLIC PANEL
CO2: 25 mEq/L (ref 19–32)
Calcium: 8.3 mg/dL — ABNORMAL LOW (ref 8.4–10.5)
Creatinine, Ser: 0.66 mg/dL (ref 0.50–1.10)
GFR calc non Af Amer: >90 mL/min (ref >90)
Glucose, Bld: 239 mg/dL — ABNORMAL HIGH (ref 70–99)
Sodium: 128 mEq/L — ABNORMAL LOW (ref 135–145)

## 2011-12-16 LAB — MAGNESIUM: Magnesium: 2 mg/dL (ref 1.5–2.5)

## 2011-12-16 LAB — CBC
Hemoglobin: 7.7 g/dL — ABNORMAL LOW (ref 12.0–15.0)
MCH: 29.4 pg (ref 26.0–34.0)
MCHC: 34.1 g/dL (ref 30.0–36.0)
MCV: 86.3 fL (ref 78.0–100.0)
Platelets: 180 10*3/uL (ref 150–400)

## 2011-12-16 MED ORDER — SODIUM CHLORIDE 0.9 % IV SOLN
Freq: Once | INTRAVENOUS | Status: AC
  Administered 2011-12-16: 17:00:00 via INTRAVENOUS

## 2011-12-16 NOTE — Progress Notes (Signed)
Patient and sister insisted the MD be paged again to see if there was anything else that could be done to relieve her gas pain besides ambulating and mylicon tabs. Dr Gerrit Friends paged, no new orders received. Continue with ambulation and mylicon PRN. Patient and sister notified of what MD said. Will continue to monitor patient.

## 2011-12-16 NOTE — Progress Notes (Signed)
Patient ID: Renee Nicholson, female   DOB: 05-31-1966, 46 y.o.   MRN: [id number]  General Surgery - [facility] - Progress Note  POD# 1  Subjective: Patient ambulatory.  Family in hallway.  Complains of "gas pains" and reflux symptoms.  Objective: Vital signs in last 24 hours: Temp:  [98.2 F (36.8 C)-98.6 F (37 C)] 98.2 F (36.8 C) (03/23 0609) Pulse Rate:  [76-103] 78  (03/23 0609) Resp:  [16-18] 16  (03/23 0609) BP: (90-96)/(45-61) 90/57 mmHg (03/23 0609) SpO2:  [97 %-100 %] 100 % (03/23 0609) Last BM Date: 12/13/11  Intake/Output from previous day: 03/22 0701 - 03/23 0700 In: 240 [P.O.:240] Out: 1530 [Urine:650; Drains:80; Stool:800]  Exam: HEENT - clear, not icteric Neck - soft Chest - clear bilaterally Cor - RRR, no murmur Abd - mod distension; stoma viable RLQ; drain with serosanguinous; rectal tube in place with thin fluid output Ext - no significant edema Neuro - grossly intact, no focal deficits  Lab Results:   Basename 12/16/11 0440 12/15/11 0422  WBC 10.1 11.3*  HGB 7.7* 7.5*  HCT 22.6* 22.2*  PLT 180 150     Basename 12/16/11 0440 12/15/11 0422  NA 128* 133*  K 4.8 4.1  CL 96 98  CO2 25 28  GLUCOSE 239* 139*  BUN 13 9  CREATININE 0.66 0.56  CALCIUM 8.3* 8.3*    Studies/Results: No results found.  Assessment: Total abdominal colectomy with J-pouch, ileostomy  Plan: Per Dr. Arita Miss post op orders Clear liquid diet - will not advance Ambulate in halls with family Foley out this AM Rectal tube in place in pouch   Velora Heckler, MD, [facility] Office: [phone]  12/16/2011

## 2011-12-16 NOTE — Progress Notes (Signed)
MD notified of low urine output.  Orders received. Jamelle Rushing K 12/16/11

## 2011-12-16 NOTE — Progress Notes (Signed)
Patient sister called RN to room, patient C/O heartburn and gas pains in abd. Pt sister requested MD to be paged to see if patient could have something for heartburn,and use Vicks vapor rub to apply to patients chest. RN paged Dr. Gerrit Friends, orders received for IV protonix 40mg  Q24 hours and patient may use Vicks vapor rub to chest. Will continue to monitor patient. CLum Keas

## 2011-12-17 ENCOUNTER — Inpatient Hospital Stay (HOSPITAL_COMMUNITY): Payer: BC Managed Care – PPO

## 2011-12-17 DIAGNOSIS — E875 Hyperkalemia: Secondary | ICD-10-CM

## 2011-12-17 DIAGNOSIS — E871 Hypo-osmolality and hyponatremia: Secondary | ICD-10-CM

## 2011-12-17 DIAGNOSIS — J95821 Acute postprocedural respiratory failure: Secondary | ICD-10-CM

## 2011-12-17 LAB — CBC
MCH: 28.8 pg (ref 26.0–34.0)
MCHC: 33.6 g/dL (ref 30.0–36.0)
MCV: 85.8 fL (ref 78.0–100.0)
Platelets: 272 10*3/uL (ref 150–400)
RBC: 3.23 MIL/uL — ABNORMAL LOW (ref 3.87–5.11)
RDW: 15.8 % — ABNORMAL HIGH (ref 11.5–15.5)

## 2011-12-17 LAB — BASIC METABOLIC PANEL
BUN: 13 mg/dL (ref 6–23)
CO2: 19 mEq/L (ref 19–32)
CO2: 22 mEq/L (ref 19–32)
Calcium: 8.4 mg/dL (ref 8.4–10.5)
Calcium: 8 mg/dL — ABNORMAL LOW (ref 8.4–10.5)
Creatinine, Ser: 0.6 mg/dL (ref 0.50–1.10)
Creatinine, Ser: 0.7 mg/dL (ref 0.50–1.10)
GFR calc Af Amer: >90 mL/min (ref >90)
GFR calc non Af Amer: >90 mL/min (ref >90)
Glucose, Bld: 140 mg/dL — ABNORMAL HIGH (ref 70–99)
Sodium: 122 mEq/L — ABNORMAL LOW (ref 135–145)

## 2011-12-17 MED ORDER — SODIUM CHLORIDE 0.9 % IV BOLUS (SEPSIS)
500.0000 mL | INTRAVENOUS | Status: AC
  Administered 2011-12-17: 500 mL via INTRAVENOUS

## 2011-12-17 MED ORDER — LACTATED RINGERS IV SOLN: INTRAVENOUS | Status: DC

## 2011-12-17 MED ORDER — SODIUM CHLORIDE 0.9 % IV BOLUS (SEPSIS)
1000.0000 mL | Freq: Once | INTRAVENOUS | Status: AC
  Administered 2011-12-17: 1000 mL via INTRAVENOUS

## 2011-12-17 MED ORDER — MENTHOL 3 MG MT LOZG
1.0000 | LOZENGE | OROMUCOSAL | Status: DC | PRN
  Administered 2011-12-17 – 2011-12-18 (×2): 3 mg via ORAL
  Filled 2011-12-17: qty 9

## 2011-12-17 MED ORDER — SODIUM CHLORIDE 0.9 % IV BOLUS (SEPSIS)
500.0000 mL | Freq: Once | INTRAVENOUS | Status: AC
  Administered 2011-12-17: 500 mL via INTRAVENOUS

## 2011-12-17 MED ORDER — SODIUM CHLORIDE 0.9 % IV SOLN
INTRAVENOUS | Status: DC
  Administered 2011-12-17: 125 mL via INTRAVENOUS
  Administered 2011-12-17: 125 mL/h via INTRAVENOUS
  Administered 2011-12-18: 1000 mL via INTRAVENOUS
  Administered 2011-12-18 (×2): 125 mL via INTRAVENOUS
  Administered 2011-12-19: 75 mL/h via INTRAVENOUS
  Administered 2011-12-19 – 2011-12-21 (×2): via INTRAVENOUS

## 2011-12-17 NOTE — Progress Notes (Signed)
3 Days Post-Op  Subjective: Called by RN regarding Renee Nicholson. She was found to be pale, tachypneic, tachycardic, borderline hypoxic and oliguric.  She also has significant abdominal distension.  Her rectal tube fell out earlier this AM.  She c/o abdominal discomfort and shortness of breath  Objective: Vital signs in last 24 hours: Temp:  [97.9 F (36.6 C)-98.2 F (36.8 C)] 97.9 F (36.6 C) (03/24 0336) Pulse Rate:  [78-115] 115  (03/24 0336) Resp:  [16-32] 32  (03/24 0336) BP: (90-133)/(57-87) 132/87 mmHg (03/24 0336) SpO2:  [99 %-100 %] 99 % (03/24 0336) Last BM Date: 12/13/11  Intake/Output from previous day: 03/23 0701 - 03/24 0700 In: 1851.7 [P.O.:100; I.V.:1597.7; IV Piggyback:4] Out: 1295 [Urine:575; Drains:20; Stool:700] Intake/Output this shift: Total I/O In: 150 [Other:150] Out: 350 [Urine:250; Stool:100]  PE: Gen-appears distressed CV-increased rate Lung-shallow, distant breath sounds Abd-firm, distended, mild diffuse tenderness, hypoactive BS, loop ileostomy pink, drain output serous and bulb is holding it's charge  Lab Results:   Basename 12/17/11 0400 12/16/11 0440  WBC 4.2 10.1  HGB 9.3* 7.7*  HCT 27.7* 22.6*  PLT 272 180   BMET  Basename 12/17/11 0400 12/16/11 0440  NA 122* 128*  K 5.3* 4.8  CL 94* 96  CO2 19 25  GLUCOSE 239* 239*  BUN 14 13  CREATININE 0.60 0.66  CALCIUM 8.4 8.3*   PT/INR No results found for this basename: LABPROT:2,INR:2 in the last 72 hours Comprehensive Metabolic Panel:    Component Value Date/Time   NA 122* 12/17/2011 0400   K 5.3* 12/17/2011 0400   CL 94* 12/17/2011 0400   CO2 19 12/17/2011 0400   BUN 14 12/17/2011 0400   CREATININE 0.60 12/17/2011 0400   CREATININE 0.70 07/27/2011 1306   GLUCOSE 239* 12/17/2011 0400   CALCIUM 8.4 12/17/2011 0400   AST 23 11/16/2011 1309   ALT 16 11/16/2011 1309   ALKPHOS 53 11/16/2011 1309   BILITOT 0.4 11/16/2011 1309   PROT 6.4 11/16/2011 1309   ALBUMIN 4.0 11/16/2011 1309      Studies/Results: Dg Chest 1 View  12/17/2011  *RADIOLOGY REPORT*  Clinical Data: Chest pain, postop colon surgery  PORTABLE CHEST - 1 VIEW  Comparison: Portable exam 0418 hours without priors for comparison.  Findings: Significant gas identified in the upper abdomen, suspect combination of marked gaseous distention of the stomach and less free intraperitoneal air. Gaseous distention of the esophagus is also identified. Elevation of the diaphragms bilaterally with bibasilar atelectasis. Mild enlargement cardiac silhouette. Minimal peribronchial thickening. No pneumothorax. Bones demineralized. Percutaneous catheter/tube the left upper quadrant.  IMPRESSION: Probable marked gaseous distention of stomach and less free intraperitoneal air in upper abdomen. Gaseous distention of the esophagus. Decreased lung volumes with accentuation of heart size and bibasilar atelectasis. No definite pneumothorax.  Findings discussed with Dr. Abbey Chatters on 12/17/2011 at 0440 hours.  Original Report Authenticated By: Lollie Marrow, M.D.    Anti-infectives: Anti-infectives     Start     Dose/Rate Route Frequency Ordered Stop   12/15/11 0800   ertapenem (INVANZ) 1 g in sodium chloride 0.9 % 50 mL IVPB        1 g 100 mL/hr over 30 Minutes Intravenous Every 24 hours 12/14/11 1537 12/15/11 0812   12/14/11 0527   ertapenem (INVANZ) 1 g in sodium chloride 0.9 % 50 mL IVPB        1 g 100 mL/hr over 30 Minutes Intravenous 60 min pre-op 12/14/11 0527 12/14/11 0805  Assessment Principal Problem:  *Rectal cancer s/p proctocolectomy, j-pouch with ileoanal anastomosis, loop ileostomy on 12/14/11  Acute respiratory distress-likely secondary to gastric distension;ng tube inserted and a large amount of air was evacuated as well as 400 cc of bilious material.  Oliguria secondary to hypovolemia  ABL anemia  Free intraperitoneal air-may be from early postop state.  Hyponatremia and hyperkalemia   LOS: 3 days    Plan: Transfer to SDU-done.  Repeat pCXR.  Change IVF.  Volume resuscitation.   Adolph Pollack 12/17/2011 ]

## 2011-12-17 NOTE — Progress Notes (Signed)
  General Surgery [facility] - Progress Note  POD# 2   Subjective: Patient now in ICU.  Earlier events today noted and discussed with Dr. Abbey Chatters.  Patient alert, more comfortable.  Wants to get OOB to chair now.  Objective: Vital signs in last 24 hours: Temp:  [97.5 F (36.4 C)-98 F (36.7 C)] 97.7 F (36.5 C) (03/24 1200) Pulse Rate:  [104-120] 120  (03/24 1400) Resp:  [20-32] 23  (03/24 1400) BP: (113-133)/(66-87) 113/71 mmHg (03/24 1400) SpO2:  [98 %-100 %] 99 % (03/24 1400) Last BM Date: 12/13/11  Intake/Output from previous day: 03/23 0701 - 03/24 0700 In: 3101.7 [P.O.:100; I.V.:1847.7; IV Piggyback:1004] Out: 1755 [Urine:625; Emesis/NG output:350; Drains:30; Stool:750]  Exam: HEENT - clear, not icteric Neck - soft Chest - clear bilaterally Cor - RRR, no murmur Abd - mod distension; quiet; drain with thin serous; mild diffuse tenderness; stoma viable Ext - no significant edema Neuro - grossly intact, no focal deficits  Lab Results:   Basename 12/17/11 0400 12/16/11 0440  WBC 4.2 10.1  HGB 9.3* 7.7*  HCT 27.7* 22.6*  PLT 272 180     Basename 12/17/11 1345 12/17/11 0400  NA 126* 122*  K 5.2* 5.3*  CL 96 94*  CO2 22 19  GLUCOSE 140* 239*  BUN 13 14  CREATININE 0.70 0.60  CALCIUM 8.0* 8.4    Studies/Results: Dg Chest 1 View  12/17/2011  *RADIOLOGY REPORT*  Clinical Data: Chest pain, postop colon surgery  PORTABLE CHEST - 1 VIEW  Comparison: Portable exam 0418 hours without priors for comparison.  Findings: Significant gas identified in the upper abdomen, suspect combination of marked gaseous distention of the stomach and less free intraperitoneal air. Gaseous distention of the esophagus is also identified. Elevation of the diaphragms bilaterally with bibasilar atelectasis. Mild enlargement cardiac silhouette. Minimal peribronchial thickening. No pneumothorax. Bones demineralized. Percutaneous catheter/tube the left upper quadrant.   IMPRESSION: Probable marked gaseous distention of stomach and less free intraperitoneal air in upper abdomen. Gaseous distention of the esophagus. Decreased lung volumes with accentuation of heart size and bibasilar atelectasis. No definite pneumothorax.  Findings discussed with Dr. Abbey Chatters on 12/17/2011 at 0440 hours.  Original Report Authenticated By: Lollie Marrow, M.D.   Dg Chest 1vsame Day  12/17/2011  *RADIOLOGY REPORT*  Clinical Data:  Hypoxia, gastric distention, abdominal pain, post colectomy, repeat exam following nasogastric tube placement with partial symptomatic improvement  CHEST - 1 VIEW SAME DAY  Comparison: Repeat portable exam 0519 hours compared to 0418 hours.  Findings: Nasogastric tube now present with in the stomach. Persistent gaseous distention of the upper abdomen is identified. This likely represents persistent gastric distention and significant free intraperitoneal air. Bibasilar pulmonary atelectasis again identified. No pneumothorax.  IMPRESSION: Persistent significant gaseous distention of the upper abdomen, likely a combination of persistent gastric distention and significant free intraperitoneal air.  Findings discussed with Dr. Abbey Chatters on 12/17/2011 at 0545 hours.  Original Report Authenticated By: Lollie Marrow, M.D.    Assessment: Stable in ICU on monitor - slight tachycardia persists UOP marginal Hyponatremia slightly improved at 126 - on NS IVF  Plan: Will give NS bolus again - 500cc OK to get pt OOB to chair Will follow closely   Velora Heckler, MD, [facility] Office: [phone]  12/17/2011

## 2011-12-17 NOTE — Progress Notes (Signed)
Rectal tube came out of pt around 01:30. MD notified. Marcelino Duster, RN

## 2011-12-17 NOTE — Progress Notes (Signed)
Repeat PCXR show some persistent gaseous distension and free peritoneal air.  She is sleeping now and has less abdominal distension and no significant tenderness.  No fever.  WBC normal.   I think the free intraperitoneal air is residual from her early postop state as she does not appear to have a GI perforation clinically.

## 2011-12-17 NOTE — Progress Notes (Signed)
RN called report to Step-down RN receiving pt in transfer. Transferred pt via bed. Marcelino Duster, RN

## 2011-12-17 NOTE — Progress Notes (Signed)
In patients room to evaluate.  Patient found dusky colored with shallow, tachypnea respirations.  Heart rate 115.  Low urinary output noted at 100cc in 6 hrs.  Total of 250cc in 16hrs.  O2 sat 90% on 2L.  Pt complains of shortness of breath.  Abdomen distended and tight.  Lungs decreased throughout.  Patient is diaphoretic and restless.  Notified Dr.  Abbey Chatters.  Orders received and bed requested for stepdown per md order.  Notified Nursing Supervisor, Georgette Shell.  Awaiting bed.

## 2011-12-18 ENCOUNTER — Inpatient Hospital Stay (HOSPITAL_COMMUNITY): Payer: BC Managed Care – PPO

## 2011-12-18 LAB — BASIC METABOLIC PANEL
BUN: 12 mg/dL (ref 6–23)
Calcium: 7.6 mg/dL — ABNORMAL LOW (ref 8.4–10.5)
GFR calc non Af Amer: >90 mL/min (ref >90)
Glucose, Bld: 118 mg/dL — ABNORMAL HIGH (ref 70–99)

## 2011-12-18 LAB — CBC
MCH: 28.7 pg (ref 26.0–34.0)
MCHC: 34.4 g/dL (ref 30.0–36.0)
MCV: 83.3 fL (ref 78.0–100.0)
Platelets: 260 10*3/uL (ref 150–400)

## 2011-12-18 LAB — PREPARE RBC (CROSSMATCH)

## 2011-12-18 MED ORDER — ACETAMINOPHEN 325 MG PO TABS
650.0000 mg | ORAL_TABLET | Freq: Once | ORAL | Status: AC
  Administered 2011-12-18: 650 mg via ORAL
  Filled 2011-12-18: qty 2

## 2011-12-18 MED ORDER — IOHEXOL 300 MG/ML  SOLN
100.0000 mL | Freq: Once | INTRAMUSCULAR | Status: AC | PRN
  Administered 2011-12-18: 100 mL via INTRAVENOUS

## 2011-12-18 MED ORDER — FUROSEMIDE 10 MG/ML IJ SOLN
20.0000 mg | Freq: Once | INTRAMUSCULAR | Status: AC
  Administered 2011-12-18: 20 mg via INTRAVENOUS
  Filled 2011-12-18: qty 2

## 2011-12-18 MED ORDER — PIPERACILLIN-TAZOBACTAM 3.375 G IVPB
3.3750 g | Freq: Three times a day (TID) | INTRAVENOUS | Status: DC
  Administered 2011-12-19 – 2011-12-23 (×13): 3.375 g via INTRAVENOUS
  Filled 2011-12-18 (×18): qty 50

## 2011-12-18 NOTE — Consult Note (Signed)
WOC ostomy consult  Patient established with [employer] post-acute product sampling and support program today. Ladona Mow, MSN, RN, GNP, CWOCN  [phone])

## 2011-12-18 NOTE — Consult Note (Signed)
WOC ostomy consult  Stoma type/location: RLQ Ileostomy Stomal assessment/size: 1 and 1/2 inch round, budded stoma Peristomal assessment: intact Treatment options for stomal/peristomal skin: None required Output: dark green effluent Ostomy pouching: 2pc. System placed today in pouch change with tail closure pointing downward. Education provided: Patient very tired today.  Sisters-in-law (2) seen in visitor waiting area.  Both express concern that patient will be returning home to (essentially) care for stoma herself as in their culture, the husband will not care for the woman's "dirty" areas.  Expressed need for visiting nurses and stated that they have mentioned this to Dr. Donell Beers. Taught that most patients require the assistance of a HHRN in the immediate post-acute phase of their recovery.  My plan will be to check in daily to see patient and to take advantage of teachable moments as they present. I will follow with you.  Thanks, Ladona Mow, MSN, RN, [facility], CWOCN [phone])

## 2011-12-18 NOTE — Progress Notes (Signed)
Patient ID: Renee Nicholson, female   DOB: Apr 27, 1966, 46 y.o.   MRN: [id number]  General Surgery - [facility] - Progress Note  POD# 4   Subjective: Events of weekend noted.  Pt feeling much better today.  Still with NGT.  Continues to be tachycardic.    Objective: Vital signs in last 24 hours: Temp:  [97.7 F (36.5 C)-98.9 F (37.2 C)] 98.2 F (36.8 C) (03/25 0800) Pulse Rate:  [113-123] 114  (03/25 0400) Resp:  [19-28] 19  (03/25 0400) BP: (106-129)/(62-87) 129/72 mmHg (03/25 0400) SpO2:  [98 %-100 %] 99 % (03/25 0400) Weight:  [108 lb 7.5 oz (49.2 kg)] 108 lb 7.5 oz (49.2 kg) (03/25 0458) Last BM Date: 12/13/11  Intake/Output from previous day: 03/24 0701 - 03/25 0700 In: 3389 [I.V.:3375; IV Piggyback:14] Out: 2365 [Urine:760; Emesis/NG output:500; Drains:930; Stool:175]  Exam: HEENT - clear, not icteric Neck - soft Chest - clear bilaterally Cor - RRR, no murmur Abd - slight distention.  Drain output serous.  Some lateral abdominal wall crepitus.  Ostomy pink with bile and gas in bag.   Ext - no significant edema Neuro - grossly intact, no focal deficits  Lab Results:   Basename 12/18/11 0340 12/17/11 0400  WBC 6.4 4.2  HGB 7.4* 9.3*  HCT 21.5* 27.7*  PLT 260 272     Basename 12/18/11 0340 12/17/11 1345  NA 129* 126*  K 4.3 5.2*  CL 100 96  CO2 22 22  GLUCOSE 118* 140*  BUN 12 13  CREATININE 0.53 0.70  CALCIUM 7.6* 8.0*    Studies/Results: Dg Chest 1 View  12/17/2011  *RADIOLOGY REPORT*  Clinical Data: Chest pain, postop colon surgery  PORTABLE CHEST - 1 VIEW  Comparison: Portable exam 0418 hours without priors for comparison.  Findings: Significant gas identified in the upper abdomen, suspect combination of marked gaseous distention of the stomach and less free intraperitoneal air. Gaseous distention of the esophagus is also identified. Elevation of the diaphragms bilaterally with bibasilar atelectasis. Mild enlargement cardiac  silhouette. Minimal peribronchial thickening. No pneumothorax. Bones demineralized. Percutaneous catheter/tube the left upper quadrant.  IMPRESSION: Probable marked gaseous distention of stomach and less free intraperitoneal air in upper abdomen. Gaseous distention of the esophagus. Decreased lung volumes with accentuation of heart size and bibasilar atelectasis. No definite pneumothorax.  Findings discussed with Dr. Abbey Chatters on 12/17/2011 at 0440 hours.  Original Report Authenticated By: Lollie Marrow, M.D.   Dg Chest 1vsame Day  12/17/2011  *RADIOLOGY REPORT*  Clinical Data:  Hypoxia, gastric distention, abdominal pain, post colectomy, repeat exam following nasogastric tube placement with partial symptomatic improvement  CHEST - 1 VIEW SAME DAY  Comparison: Repeat portable exam 0519 hours compared to 0418 hours.  Findings: Nasogastric tube now present with in the stomach. Persistent gaseous distention of the upper abdomen is identified. This likely represents persistent gastric distention and significant free intraperitoneal air. Bibasilar pulmonary atelectasis again identified. No pneumothorax.  IMPRESSION: Persistent significant gaseous distention of the upper abdomen, likely a combination of persistent gastric distention and significant free intraperitoneal air.  Findings discussed with Dr. Abbey Chatters on 12/17/2011 at 0545 hours.  Original Report Authenticated By: Lollie Marrow, M.D.    Assessment: Stable in ICU on monitor - slight tachycardia persists Hypernatremia continues to improve. UOP stable, but at lower limit normal. ABL anemia stable - likely contributing to tachycardia.  Plan: CT scan to look for internal hernia vs SMV thrombosis Recheck labs in AM Will follow  closely  12/18/2011

## 2011-12-19 LAB — CBC
HCT: 33.4 % — ABNORMAL LOW (ref 36.0–46.0)
Hemoglobin: 11.5 g/dL — ABNORMAL LOW (ref 12.0–15.0)
MCV: 81.9 fL (ref 78.0–100.0)
RDW: 15.5 % (ref 11.5–15.5)
WBC: 10.4 10*3/uL (ref 4.0–10.5)

## 2011-12-19 LAB — BASIC METABOLIC PANEL
BUN: 6 mg/dL (ref 6–23)
CO2: 25 mEq/L (ref 19–32)
Chloride: 99 mEq/L (ref 96–112)
Creatinine, Ser: 0.46 mg/dL — ABNORMAL LOW (ref 0.50–1.10)
GFR calc Af Amer: >90 mL/min (ref >90)
Glucose, Bld: 95 mg/dL (ref 70–99)
Potassium: 2.9 mEq/L — ABNORMAL LOW (ref 3.5–5.1)

## 2011-12-19 LAB — TYPE AND SCREEN
ABO/RH(D): B POS
Unit division: 0

## 2011-12-19 LAB — HEPATIC FUNCTION PANEL
ALT: 16 U/L (ref 0–35)
AST: 16 U/L (ref 0–37)
Albumin: 1.8 g/dL — ABNORMAL LOW (ref 3.5–5.2)
Alkaline Phosphatase: 65 U/L (ref 39–117)
Total Protein: 5 g/dL — ABNORMAL LOW (ref 6.0–8.3)

## 2011-12-19 MED ORDER — ALBUMIN HUMAN 25 % IV SOLN
25.0000 g | Freq: Four times a day (QID) | INTRAVENOUS | Status: AC
  Administered 2011-12-19 – 2011-12-20 (×4): 25 g via INTRAVENOUS
  Filled 2011-12-19 (×4): qty 100

## 2011-12-19 MED ORDER — POTASSIUM CHLORIDE 10 MEQ/100ML IV SOLN
10.0000 meq | INTRAVENOUS | Status: AC
  Administered 2011-12-19 (×6): 10 meq via INTRAVENOUS
  Filled 2011-12-19: qty 100
  Filled 2011-12-19: qty 300
  Filled 2011-12-19 (×2): qty 100

## 2011-12-19 NOTE — Progress Notes (Signed)
Patient ID: Renee Nicholson, female   DOB: Feb 10, 1966, 46 y.o.   MRN: [id number]  General Surgery - [facility] - Progress Note  POD# 5   Subjective: Pt less weak after blood transfusion.  C/O aching back/but abdominal pain continues to improve.  No nausea or vomiting.    Objective: Vital signs in last 24 hours: Temp:  [98.4 F (36.9 C)-99.9 F (37.7 C)] 98.6 F (37 C) (03/26 0406) Pulse Rate:  [91-116] 91  (03/26 0600) Resp:  [20-27] 22  (03/26 0600) BP: (118-144)/(62-79) 130/65 mmHg (03/26 0722) SpO2:  [2 %-100 %] 97 % (03/26 0600) Last BM Date: 12/19/11  Intake/Output from previous day: 03/25 0701 - 03/26 0700 In: 5144.5 [I.V.:2850; Blood:700; NG/GT:1570; IV Piggyback:24.5] Out: 5305 [Urine:3325; Emesis/NG output:600; Drains:580; Stool:800]  Exam: HEENT - clear, not icteric Neck - soft Abd - less distended.  Drain output serous.  Abdominal wall crepitus not appreciated today.  Ostomy pink with bile and gas in bag.   Ext - no significant edema Neuro - grossly intact, no focal deficits  Lab Results:   Basename 12/19/11 0340 12/18/11 0340  WBC 10.4 6.4  HGB 11.5* 7.4*  HCT 33.4* 21.5*  PLT 226 260     Basename 12/19/11 0340 12/18/11 0340  NA 135 129*  K 2.9* 4.3  CL 99 100  CO2 25 22  GLUCOSE 95 118*  BUN 6 12  CREATININE 0.46* 0.53  CALCIUM 7.8* 7.6*    Studies/Results: Ct Abdomen Pelvis W Contrast  12/18/2011  *RADIOLOGY REPORT*  Clinical Data: Evaluate for abscess. History of rectal cancer diagnosed in October 2012.  Chemotherapy and radiation therapy complete.  CT ABDOMEN AND PELVIS WITH CONTRAST  Technique:  Multidetector CT imaging of the abdomen and pelvis was performed following the standard protocol during bolus administration of intravenous contrast.  Contrast:  100 ml of Omnipaque-300.  Comparison: CT of chest, abdomen and pelvis dated 10/27/2011.  Findings:  Lung Bases: Moderate bilateral pleural effusions layering dependently.   There are associated areas of passive atelectasis in the lower lobes of the lungs bilaterally.  Abdomen/Pelvis:  There is a large volume of pneumoperitoneum, and gas tracking in the anterior abdominal wall (left greater than right).  The enhanced appearance of the liver, gallbladder, pancreas, spleen, bilateral adrenal glands and bilateral kidneys is unremarkable.  Mild dilatation of the second portion of the duodenum, with abrupt narrowing of the caliber of the third portion of the duodenum as it traverses beneath the SMA (demonstrated on images 45 - 51 of series 2).  Borderline dilated loops of jejunum and ileum are also noted. Postoperative changes of total proctocolectomy and ileoanal anastomosis are noted.  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  A surgical drain is in place entering the left upper quadrant of the abdomen, extending down into the pelvis.  A large volume of ascites is noted, with the largest collection in the left upper quadrant of the abdomen.  Uterus and bilateral ovaries are unremarkable.  A Foley balloon catheter is present within the lumen of the urinary bladder which is nearly completely decompressed, contains a small amount of gas in the nondependent portion (presumably iatrogenic).  Musculoskeletal: There are no aggressive appearing lytic or blastic lesions noted in the visualized portions of the skeleton.  IMPRESSION: 1.  Postoperative changes of total proctocolectomy with the ileoanal anastomosis and diverting right lower quadrant ileostomy, with the large volume of ascites and pneumoperitoneum, as above.  Given that the patient is currently on postoperative day #4, these findings may not be entirely unexpected.  However, the volume of pneumoperitoneum is slightly greater than typically seen (this may represent residual insufflate from the laparoscopic procedure).  No definite abscess identified at this time. 2.  Diffuse dilatation of the small  bowel, favored to represent a postoperative ileus.  There is a more focal mild dilatation of the duodenum with a transition point as the duodenum traverses beneath the SMA.  This may be because of mass effect from overlying organs, including the stomach which is rather distended. 3.  Moderate bilateral pleural effusions with associated areas of dependent atelectasis throughout the lower lobes of the lungs bilaterally.  Original Report Authenticated By: Florencia Reasons, M.D.    Assessment: Tachycardia improved with blood administration Hypernatremia resolved. UOP improved. ABL anemia - improved after blood  Plan: Still concerned about amount of air in abdomen, but pt continues to have benign abdominal exam. Will d/c NGT and foley and ambulate today. Clears. Still may need to look in abdomen if no improvement. Hypokalemia - replete IV Hypoalbuminemia - replete with 24 hours of 25% albumin. Recheck labs in AM Will follow closely  12/19/2011

## 2011-12-20 LAB — COMPREHENSIVE METABOLIC PANEL
ALT: 13 U/L (ref 0–35)
Alkaline Phosphatase: 55 U/L (ref 39–117)
Chloride: 99 mEq/L (ref 96–112)
GFR calc Af Amer: >90 mL/min (ref >90)
Glucose, Bld: 107 mg/dL — ABNORMAL HIGH (ref 70–99)
Potassium: 3.6 mEq/L (ref 3.5–5.1)
Sodium: 136 mEq/L (ref 135–145)
Total Bilirubin: 1.5 mg/dL — ABNORMAL HIGH (ref 0.3–1.2)
Total Protein: 5.6 g/dL — ABNORMAL LOW (ref 6.0–8.3)

## 2011-12-20 LAB — CBC
HCT: 29.8 % — ABNORMAL LOW (ref 36.0–46.0)
MCHC: 34.6 g/dL (ref 30.0–36.0)
MCV: 81.4 fL (ref 78.0–100.0)
Platelets: 248 10*3/uL (ref 150–400)
RDW: 15.5 % (ref 11.5–15.5)

## 2011-12-20 LAB — DIFFERENTIAL
Basophils Absolute: 0.1 10*3/uL (ref 0.0–0.1)
Eosinophils Absolute: 0.1 10*3/uL (ref 0.0–0.7)
Lymphocytes Relative: 6 % — ABNORMAL LOW (ref 12–46)
Lymphs Abs: 0.7 10*3/uL (ref 0.7–4.0)
Neutro Abs: 9.2 10*3/uL — ABNORMAL HIGH (ref 1.7–7.7)

## 2011-12-20 MED ORDER — LOPERAMIDE HCL 2 MG PO CAPS
2.0000 mg | ORAL_CAPSULE | Freq: Three times a day (TID) | ORAL | Status: DC
  Administered 2011-12-20 (×3): 2 mg via ORAL
  Filled 2011-12-20 (×6): qty 1

## 2011-12-20 MED ORDER — OXYCODONE-ACETAMINOPHEN 5-325 MG PO TABS
1.0000 | ORAL_TABLET | ORAL | Status: DC | PRN
  Administered 2012-01-01: 1 via ORAL
  Filled 2011-12-20: qty 1

## 2011-12-20 MED ORDER — MORPHINE SULFATE 2 MG/ML IJ SOLN
1.0000 mg | INTRAMUSCULAR | Status: DC | PRN
  Administered 2011-12-20 – 2012-01-04 (×6): 2 mg via INTRAVENOUS
  Administered 2012-01-07: 1 mg via INTRAVENOUS
  Filled 2011-12-20 (×4): qty 1
  Filled 2011-12-20: qty 2
  Filled 2011-12-20 (×3): qty 1

## 2011-12-20 NOTE — Progress Notes (Signed)
Patient ID: Renee Nicholson, female   DOB: Feb 21, 1966, 46 y.o.   MRN: [id number]  General Surgery - [facility] - Progress Note  POD# 6   Subjective: Heart rate continues to go down.  Pt vented significant gas from ileostomy yesterday and last night.  Pain scores are 0-1 all night.    Objective: Vital signs in last 24 hours: Temp:  [97.3 F (36.3 C)-98.8 F (37.1 C)] 97.3 F (36.3 C) (03/27 0800) Pulse Rate:  [63-98] 67  (03/27 0800) Resp:  [18-30] 18  (03/27 0828) BP: (107-119)/(55-65) 112/58 mmHg (03/27 0800) SpO2:  [93 %-98 %] 96 % (03/27 0828) Last BM Date: 12/19/11  Intake/Output from previous day: 03/26 0701 - 03/27 0700 In: 2950 [P.O.:460; I.V.:1465; IV Piggyback:1025] Out: 3355 [Urine:1200; Drains:630; Stool:1525]  Exam: HEENT - clear, not icteric Neck - soft Abd - Non-distended today.  Drain output serous.  No abdominal wall crepitus. Ostomy pink with bile and gas in bag.   Ext - no significant edema Neuro - grossly intact, no focal deficits  Lab Results:   Basename 12/20/11 0334 12/19/11 0340  WBC 11.0* 10.4  HGB 10.3* 11.5*  HCT 29.8* 33.4*  PLT 248 226     Basename 12/20/11 0334 12/19/11 0340  NA 136 135  K 3.6 2.9*  CL 99 99  CO2 21 25  GLUCOSE 107* 95  BUN 10 6  CREATININE 0.63 0.46*  CALCIUM 8.8 7.8*    Studies/Results: Ct Abdomen Pelvis W Contrast  12/18/2011  *RADIOLOGY REPORT*  Clinical Data: Evaluate for abscess. History of rectal cancer diagnosed in October 2012.  Chemotherapy and radiation therapy complete.  CT ABDOMEN AND PELVIS WITH CONTRAST  Technique:  Multidetector CT imaging of the abdomen and pelvis was performed following the standard protocol during bolus administration of intravenous contrast.  Contrast:  100 ml of Omnipaque-300.  Comparison: CT of chest, abdomen and pelvis dated 10/27/2011.  Findings:  Lung Bases: Moderate bilateral pleural effusions layering dependently.  There are associated areas of passive  atelectasis in the lower lobes of the lungs bilaterally.  Abdomen/Pelvis:  There is a large volume of pneumoperitoneum, and gas tracking in the anterior abdominal wall (left greater than right).  The enhanced appearance of the liver, gallbladder, pancreas, spleen, bilateral adrenal glands and bilateral kidneys is unremarkable.  Mild dilatation of the second portion of the duodenum, with abrupt narrowing of the caliber of the third portion of the duodenum as it traverses beneath the SMA (demonstrated on images 45 - 51 of series 2).  Borderline dilated loops of jejunum and ileum are also noted. Postoperative changes of total proctocolectomy and ileoanal anastomosis are noted.  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  A surgical drain is in place entering the left upper quadrant of the abdomen, extending down into the pelvis.  A large volume of ascites is noted, with the largest collection in the left upper quadrant of the abdomen.  Uterus and bilateral ovaries are unremarkable.  A Foley balloon catheter is present within the lumen of the urinary bladder which is nearly completely decompressed, contains a small amount of gas in the nondependent portion (presumably iatrogenic).  Musculoskeletal: There are no aggressive appearing lytic or blastic lesions noted in the visualized portions of the skeleton.  IMPRESSION: 1.  Postoperative changes of total proctocolectomy with the ileoanal anastomosis and diverting right lower quadrant ileostomy, with the large volume of ascites and pneumoperitoneum, as above. Given that  the patient is currently on postoperative day #4, these findings may not be entirely unexpected.  However, the volume of pneumoperitoneum is slightly greater than typically seen (this may represent residual insufflate from the laparoscopic procedure).  No definite abscess identified at this time. 2.  Diffuse dilatation of the small bowel, favored to represent a  postoperative ileus.  There is a more focal mild dilatation of the duodenum with a transition point as the duodenum traverses beneath the SMA.  This may be because of mass effect from overlying organs, including the stomach which is rather distended. 3.  Moderate bilateral pleural effusions with associated areas of dependent atelectasis throughout the lower lobes of the lungs bilaterally.  Original Report Authenticated By: Florencia Reasons, M.D.    Assessment: Tachycardia resolved.  Pt tolerated clears.  Plan: Will advance diet today. Consider repeat xray or scan tomorrow. Hypokalemia - resolved. Hypoalbuminemia - improved after albumin Recheck labs in AM Will follow closely Probably back to floor later today. 12/20/2011

## 2011-12-21 ENCOUNTER — Telehealth: Payer: Self-pay | Admitting: Oncology

## 2011-12-21 LAB — CBC
HCT: 28.7 % — ABNORMAL LOW (ref 36.0–46.0)
MCHC: 35.2 g/dL (ref 30.0–36.0)
MCV: 81.5 fL (ref 78.0–100.0)
RDW: 15.5 % (ref 11.5–15.5)

## 2011-12-21 LAB — COMPREHENSIVE METABOLIC PANEL
Albumin: 2.6 g/dL — ABNORMAL LOW (ref 3.5–5.2)
BUN: 11 mg/dL (ref 6–23)
Calcium: 8.2 mg/dL — ABNORMAL LOW (ref 8.4–10.5)
Creatinine, Ser: 0.5 mg/dL (ref 0.50–1.10)
Total Bilirubin: 1.3 mg/dL — ABNORMAL HIGH (ref 0.3–1.2)
Total Protein: 5.1 g/dL — ABNORMAL LOW (ref 6.0–8.3)

## 2011-12-21 MED ORDER — POTASSIUM CHLORIDE CRYS ER 20 MEQ PO TBCR: 40.0000 meq | EXTENDED_RELEASE_TABLET | Freq: Two times a day (BID) | ORAL | Status: DC

## 2011-12-21 MED ORDER — LOPERAMIDE HCL 2 MG PO CAPS
4.0000 mg | ORAL_CAPSULE | Freq: Three times a day (TID) | ORAL | Status: DC
  Administered 2011-12-21 – 2012-01-01 (×34): 4 mg via ORAL
  Filled 2011-12-21 (×4): qty 2
  Filled 2011-12-21: qty 1
  Filled 2011-12-21 (×13): qty 2
  Filled 2011-12-21: qty 1
  Filled 2011-12-21 (×6): qty 2
  Filled 2011-12-21: qty 1
  Filled 2011-12-21 (×12): qty 2

## 2011-12-21 MED ORDER — POTASSIUM CHLORIDE IN NACL 40-0.9 MEQ/L-% IV SOLN
INTRAVENOUS | Status: DC
  Administered 2011-12-21 – 2011-12-22 (×2): via INTRAVENOUS
  Administered 2011-12-23 – 2011-12-24 (×3): 75 mL/h via INTRAVENOUS
  Administered 2011-12-25: 22:00:00 via INTRAVENOUS
  Administered 2011-12-25: 75 mL/h via INTRAVENOUS
  Administered 2011-12-26: 16:00:00 via INTRAVENOUS
  Filled 2011-12-21 (×13): qty 1000

## 2011-12-21 MED ORDER — BOOST PLUS PO LIQD
237.0000 mL | Freq: Every day | ORAL | Status: DC | PRN
  Filled 2011-12-21: qty 237

## 2011-12-21 MED ORDER — POTASSIUM CHLORIDE CRYS ER 20 MEQ PO TBCR
40.0000 meq | EXTENDED_RELEASE_TABLET | Freq: Three times a day (TID) | ORAL | Status: DC
  Administered 2011-12-21: 40 meq via ORAL
  Filled 2011-12-21: qty 2

## 2011-12-21 MED ORDER — POTASSIUM CHLORIDE CRYS ER 20 MEQ PO TBCR: 80.0000 meq | EXTENDED_RELEASE_TABLET | Freq: Once | ORAL | Status: DC

## 2011-12-21 MED ORDER — POTASSIUM CHLORIDE CRYS ER 10 MEQ PO TBCR
40.0000 meq | EXTENDED_RELEASE_TABLET | Freq: Two times a day (BID) | ORAL | Status: DC
  Administered 2011-12-21 – 2012-01-05 (×25): 40 meq via ORAL
  Administered 2012-01-06: 20 meq via ORAL
  Administered 2012-01-06 – 2012-01-09 (×6): 40 meq via ORAL
  Filled 2011-12-21: qty 2
  Filled 2011-12-21 (×3): qty 4
  Filled 2011-12-21 (×4): qty 2
  Filled 2011-12-21 (×4): qty 4
  Filled 2011-12-21: qty 2
  Filled 2011-12-21: qty 4
  Filled 2011-12-21 (×2): qty 2
  Filled 2011-12-21 (×3): qty 4
  Filled 2011-12-21: qty 2
  Filled 2011-12-21 (×4): qty 4
  Filled 2011-12-21: qty 2
  Filled 2011-12-21: qty 4
  Filled 2011-12-21: qty 2
  Filled 2011-12-21 (×2): qty 4
  Filled 2011-12-21 (×3): qty 2
  Filled 2011-12-21: qty 4
  Filled 2011-12-21: qty 2
  Filled 2011-12-21 (×2): qty 4
  Filled 2011-12-21: qty 2
  Filled 2011-12-21 (×3): qty 4

## 2011-12-21 MED ORDER — ACETAMINOPHEN 325 MG PO TABS
650.0000 mg | ORAL_TABLET | ORAL | Status: DC | PRN
  Administered 2011-12-22 – 2012-01-02 (×3): 650 mg via ORAL
  Filled 2011-12-21 (×3): qty 2

## 2011-12-21 MED ORDER — PANTOPRAZOLE SODIUM 40 MG PO TBEC
40.0000 mg | DELAYED_RELEASE_TABLET | Freq: Every day | ORAL | Status: DC
  Administered 2011-12-21 – 2011-12-23 (×3): 40 mg via ORAL
  Filled 2011-12-21 (×4): qty 1

## 2011-12-21 NOTE — Telephone Encounter (Signed)
Received an email from clinical social work asking that pt's language/interpreter preferences be updated. Called pt's home re this preference and was asked by pt's niece to call sister in law Renee Nicholson @ [phone]. Per Renee Nicholson pt speaks [place] and we should proceed with setting up an interpreter for her 4/10 appt @ 3:30 pm. Languange/interpreter preference update in EPIC.

## 2011-12-21 NOTE — Progress Notes (Signed)
INITIAL ADULT NUTRITION ASSESSMENT Date: 12/21/2011   Time: 3:53 PM Reason for Assessment: Patient with poor PO intake  ASSESSMENT: Female 46 y.o.  Dx: Rectal cancer  Hx:  Past Medical History  Diagnosis Date  . Anemia   . Diarrhea   . Rectal cancer 08/16/2011  . Colon cancer   . Anxiety   . Bright red rectal bleeding 09/13/2011  . History of chemotherapy     completed 09/2011     Related Meds:  Scheduled Meds:   . enoxaparin  40 mg Subcutaneous Q24H  . loperamide  4 mg Oral TID  . pantoprazole  40 mg Oral Q1200  . piperacillin-tazobactam (ZOSYN)  IV  3.375 g Intravenous Q8H  . potassium chloride  40 mEq Oral BID  . DISCONTD: loperamide  2 mg Oral TID  . DISCONTD: pantoprazole (PROTONIX) IV  40 mg Intravenous Q24H  . DISCONTD: potassium chloride  40 mEq Oral TID  . DISCONTD: potassium chloride  40 mEq Oral BID  . DISCONTD: potassium chloride  40 mEq Oral BID  . DISCONTD: potassium chloride  80 mEq Oral Once   Continuous Infusions:   . sodium chloride 75 mL/hr at 12/21/11 0347  . 0.9 % NaCl with KCl 40 mEq / L 75 mL/hr at 12/21/11 0738   PRN Meds:.diphenhydrAMINE, diphenhydrAMINE, diphenoxylate-atropine, menthol-cetylpyridinium, morphine injection, naloxone, ondansetron (ZOFRAN) IV, ondansetron (ZOFRAN) IV, ondansetron, oxyCODONE-acetaminophen, sodium chloride   Ht: 4\' 9"  (144.8 cm)  Wt: 108 lb 7.5 oz (49.2 kg)  Ideal Wt: 38.6 kg 43.1 kg % Ideal Wt: 120%  Body mass index is 23.47 kg/(m^2).  Food/Nutrition Related Hx: Patient with poor PO intake documented 25% at meals.   Labs:  CMP     Component Value Date/Time   NA 132* 12/21/2011 0330   K 3.0* 12/21/2011 0330   CL 97 12/21/2011 0330   CO2 23 12/21/2011 0330   GLUCOSE 110* 12/21/2011 0330   BUN 11 12/21/2011 0330   CREATININE 0.50 12/21/2011 0330   CREATININE 0.70 07/27/2011 1306   CALCIUM 8.2* 12/21/2011 0330   PROT 5.1* 12/21/2011 0330   ALBUMIN 2.6* 12/21/2011 0330   AST 18 12/21/2011 0330   ALT 15  12/21/2011 0330   ALKPHOS 63 12/21/2011 0330   BILITOT 1.3* 12/21/2011 0330   GFRNONAA >90 12/21/2011 0330   GFRAA >90 12/21/2011 0330    Intake/Output Summary (Last 24 hours) at 12/21/11 1555 Last data filed at 12/21/11 1505  Gross per 24 hour  Intake   1755 ml  Output   1395 ml  Net    360 ml     Diet Order: General  Supplements/Tube Feeding: none at this time  IVF:    sodium chloride Last Rate: 75 mL/hr at 12/21/11 0347  0.9 % NaCl with KCl 40 mEq / L Last Rate: 75 mL/hr at 12/21/11 0738    Estimated Nutritional Needs:   Kcal: 0981-1914 Protein: 73.6-88.36 grams Fluid: 1 ml per kcal   NUTRITION DIAGNOSIS: -Inadequate oral intake (NI-2.1).  Status: Ongoing  RELATED TO: poor appetite   AS EVIDENCE BY: documented PO intake of 25% at meals.  MONITORING/EVALUATION(Goals): Diet tolerance, PO intake, labs, I/O's, weight trends 1. PO intake >75% at meals and supplements. 2. Meet >90% of estimated energy needs.   EDUCATION NEEDS: -Education needs addressed  INTERVENTION: 1. Will order Boost nutrition supplement once daily. 2. Education needs addressed with diet education for ileostomy this morning.  3. RD to follow for nutrition needs.   Dietitian [phone]  DOCUMENTATION CODES Per approved criteria  -Not Applicable    Iven Finn [facility] 12/21/2011, 3:53 PM

## 2011-12-21 NOTE — Progress Notes (Signed)
Patient ID: Renee Nicholson, female   DOB: 03-11-1966, 46 y.o.   MRN: [id number]  General Surgery - [facility] - Progress Note  Subjective: Pt did well with full liquids.  Ostomy output down with addition of imodium.      Objective: Vital signs in last 24 hours: Temp:  [98.2 F (36.8 C)-99.2 F (37.3 C)] 98.3 F (36.8 C) (03/28 1220) Pulse Rate:  [64-70] 67  (03/28 1220) Resp:  [18-26] 18  (03/28 1220) BP: (116-163)/(57-79) 163/78 mmHg (03/28 1220) SpO2:  [95 %-100 %] 97 % (03/28 1220) Last BM Date: 12/21/11  Intake/Output from previous day: 03/27 0701 - 03/28 0700 In: 2250.8 [P.O.:180; I.V.:1990.8; IV Piggyback:50] Out: 1815 [Urine:550; Drains:465; Stool:800]  Exam: HEENT - clear, not icteric Neck - soft Abd - Non-distended.  Drain output serous.  No abdominal wall crepitus. Ostomy pink with bile and gas in bag.  Minimal tenderness at incisions. Ext - no significant edema Neuro - grossly intact, no focal deficits  Lab Results:   Basename 12/21/11 0330 12/20/11 0334  WBC 8.1 11.0*  HGB 10.1* 10.3*  HCT 28.7* 29.8*  PLT 267 248     Basename 12/21/11 0330 12/20/11 0334  NA 132* 136  K 3.0* 3.6  CL 97 99  CO2 23 21  GLUCOSE 110* 107*  BUN 11 10  CREATININE 0.50 0.63  CALCIUM 8.2* 8.8    Studies/Results: No results found.  Assessment: Doing better.  Plan: Will advance diet to regular. Replete hypokalemia- place on daily K.   Hypoalbuminemia - improved after albumin Recheck labs in AM Will follow closely Transfer to floor.   12/21/2011

## 2011-12-21 NOTE — Plan of Care (Signed)
Problem: Food- and Nutrition-Related Knowledge Deficit (NB-1.1) Goal: Nutrition education Formal process to instruct or train a patient/client in a skill or to impart knowledge to help patients/clients voluntarily manage or modify food choices and eating behavior to maintain or improve health.  Outcome: Completed/Met Date Met:  12/21/11 Knowledge deficit resolved 3/28 with diet education.   Comments:  Patient does not speak [place]. Family present at time of RD visit. Family spoke English and was able to translate diet education to patient. Discussed and provided handout obtained from ADA nutrition care manual, "Nutrition Therapy for Ileostomy". Also provided and discussed ileostomy nutrition booklet. Patient and family asked good questions. No further nutrition questions. Verbalized understanding. Expect good compliance.  Family stated patient would be starting chemo soon. Provided number to out-patient oncology RD.   RD to follow for nutrition needs.  Renee Nicholson [facility] [phone]

## 2011-12-21 NOTE — Progress Notes (Signed)
The patient is receiving Protonix by the intravenous route.  Based on criteria approved by the Pharmacy and Therapeutics Committee and the Medical Executive Committee, the medication is being converted to the equivalent oral dose form.  These criteria include: -No Active GI bleeding -Able to tolerate diet of full liquids (or better) or tube feeding -Able to tolerate other medications by the oral or enteral route  If you have any questions about this conversion, please contact the Pharmacy Department (phone [phone]).  Thank you.  Chilton Si, Aidian Salomon L 1:18 PM

## 2011-12-22 LAB — BASIC METABOLIC PANEL
BUN: 5 mg/dL — ABNORMAL LOW (ref 6–23)
CO2: 26 mEq/L (ref 19–32)
Chloride: 97 mEq/L (ref 96–112)
GFR calc Af Amer: >90 mL/min (ref >90)
Potassium: 3.8 mEq/L (ref 3.5–5.1)

## 2011-12-22 LAB — CBC
HCT: 33.2 % — ABNORMAL LOW (ref 36.0–46.0)
Hemoglobin: 11.4 g/dL — ABNORMAL LOW (ref 12.0–15.0)
MCHC: 34.3 g/dL (ref 30.0–36.0)
MCV: 81.8 fL (ref 78.0–100.0)

## 2011-12-22 MED ORDER — ALUM & MAG HYDROXIDE-SIMETH 200-200-20 MG/5ML PO SUSP
15.0000 mL | ORAL | Status: DC | PRN
  Administered 2011-12-23 – 2011-12-26 (×6): 15 mL via ORAL
  Filled 2011-12-22 (×7): qty 30

## 2011-12-22 MED ORDER — ENSURE COMPLETE PO LIQD
237.0000 mL | Freq: Every day | ORAL | Status: DC | PRN
  Filled 2011-12-22: qty 237

## 2011-12-22 NOTE — Consult Note (Signed)
WOC ostomy consult  Stoma type/location: RLQ ileostomy Stomal assessment/size: 1 and 1/2 inch round, budded ileostomy Peristomal assessment: clear Treatment options for stomal/peristomal skin: None required Output thin, dk green liquid. Ostomy pouching: 1pc. Convex pouch, [phone]  Education provided: Demonstration of pouch change.  Patient able to give return demonstration of Lock and Roll closure and cleansing of tail portion of pouch after emptying. Supplies (4) provided.  New pouch type communicated to [facility] post-acute sampling program. Next pouch change due Tuesday, 4/2 unless leakage occurs. Our team will follow if she is still here and for any questions as needed.   Thanks, Ladona Mow, MSN, RN, [facility], CWOCN [phone])

## 2011-12-22 NOTE — Progress Notes (Signed)
4 1-piece ileostomy pouches given to patient per request of colostomy nurse.  Renee Nicholson 3:51 PM 12/22/2011

## 2011-12-22 NOTE — Progress Notes (Signed)
Brief Nutrition Note  Patient dislikes chocolate. Discontinued the Chocolate Boost Supplement and ordered Ensure Complete vanilla once daily.   RD to follow for nutrition needs.  Adron Bene Pager [phone]

## 2011-12-22 NOTE — Plan of Care (Signed)
Problem: Phase II Progression Outcomes Goal: Sutures/staples intact Outcome: Not Applicable Date Met:  12/22/11 Abdominal surgical sites with dermabond

## 2011-12-22 NOTE — Progress Notes (Signed)
Patient ID: Renee Nicholson, female   DOB: 1966-07-09, 45 y.o.   MRN: [id number] [facility] Progress Note:   8 Days Post-Op  Subjective: Mental status is alert, up in chair with family Objective: Vital signs in last 24 hours: Temp:  [98.3 F (36.8 C)-99.9 F (37.7 C)] 99.4 F (37.4 C) (03/29 0700) Pulse Rate:  [67-73] 73  (03/29 0700) Resp:  [16-20] 20  (03/29 0700) BP: (131-163)/(78-81) 131/78 mmHg (03/29 0700) SpO2:  [96 %-99 %] 99 % (03/29 0700)  Intake/Output from previous day: 03/28 0701 - 03/29 0700 In: 1860 [P.O.:600; I.V.:1210; IV Piggyback:50] Out: 680 [Urine:100; Drains:350; Stool:230] Intake/Output this shift: Total I/O In: -  Out: 120 [Urine:100; Drains:20]  Physical Exam: Work of breathing is  Normal.  Ostomy bag with liquid and air.  JP serous  Lab Results:  Results for orders placed during the hospital encounter of 12/14/11 (from the past 48 hour(s))  CBC     Status: Abnormal   Collection Time   12/21/11  3:30 AM      Component Value Range Comment   WBC 8.1  4.0 - 10.5 (K/uL)    RBC 3.52 (*) 3.87 - 5.11 (MIL/uL)    Hemoglobin 10.1 (*) 12.0 - 15.0 (g/dL)    HCT 82.9 (*) 56.2 - 46.0 (%)    MCV 81.5  78.0 - 100.0 (fL)    MCH 28.7  26.0 - 34.0 (pg)    MCHC 35.2  30.0 - 36.0 (g/dL)    RDW 13.0  86.5 - 78.4 (%)    Platelets 267  150 - 400 (K/uL)   COMPREHENSIVE METABOLIC PANEL     Status: Abnormal   Collection Time   12/21/11  3:30 AM      Component Value Range Comment   Sodium 132 (*) 135 - 145 (mEq/L)    Potassium 3.0 (*) 3.5 - 5.1 (mEq/L)    Chloride 97  96 - 112 (mEq/L)    CO2 23  19 - 32 (mEq/L)    Glucose, Bld 110 (*) 70 - 99 (mg/dL)    BUN 11  6 - 23 (mg/dL)    Creatinine, Ser 6.96  0.50 - 1.10 (mg/dL)    Calcium 8.2 (*) 8.4 - 10.5 (mg/dL)    Total Protein 5.1 (*) 6.0 - 8.3 (g/dL)    Albumin 2.6 (*) 3.5 - 5.2 (g/dL)    AST 18  0 - 37 (U/L)    ALT 15  0 - 35 (U/L)    Alkaline Phosphatase 63  39 - 117 (U/L)    Total Bilirubin 1.3 (*)  0.3 - 1.2 (mg/dL)    GFR calc non Af Amer >90  >90 (mL/min)    GFR calc Af Amer >90  >90 (mL/min)   BASIC METABOLIC PANEL     Status: Abnormal   Collection Time   12/22/11  4:13 AM      Component Value Range Comment   Sodium 134 (*) 135 - 145 (mEq/L)    Potassium 3.8  3.5 - 5.1 (mEq/L)    Chloride 97  96 - 112 (mEq/L)    CO2 26  19 - 32 (mEq/L)    Glucose, Bld 116 (*) 70 - 99 (mg/dL)    BUN 5 (*) 6 - 23 (mg/dL)    Creatinine, Ser 2.95 (*) 0.50 - 1.10 (mg/dL)    Calcium 8.5  8.4 - 10.5 (mg/dL)    GFR calc non Af Amer >90  >90 (mL/min)  GFR calc Af Amer >90  >90 (mL/min)   CBC     Status: Abnormal   Collection Time   12/22/11  4:13 AM      Component Value Range Comment   WBC 9.7  4.0 - 10.5 (K/uL)    RBC 4.06  3.87 - 5.11 (MIL/uL)    Hemoglobin 11.4 (*) 12.0 - 15.0 (g/dL)    HCT 91.4 (*) 78.2 - 46.0 (%)    MCV 81.8  78.0 - 100.0 (fL)    MCH 28.1  26.0 - 34.0 (pg)    MCHC 34.3  30.0 - 36.0 (g/dL)    RDW 95.6  21.3 - 08.6 (%)    Platelets 327  150 - 400 (K/uL)     Radiology/Results: No results found.  Anti-infectives: Anti-infectives     Start     Dose/Rate Route Frequency Ordered Stop   12/18/11 1830  piperacillin-tazobactam (ZOSYN) IVPB 3.375 g       3.375 g 12.5 mL/hr over 240 Minutes Intravenous 3 times per day 12/18/11 1744     12/15/11 0800   ertapenem (INVANZ) 1 g in sodium chloride 0.9 % 50 mL IVPB        1 g 100 mL/hr over 30 Minutes Intravenous Every 24 hours 12/14/11 1537 12/15/11 0812   12/14/11 0527   ertapenem (INVANZ) 1 g in sodium chloride 0.9 % 50 mL IVPB        1 g 100 mL/hr over 30 Minutes Intravenous 60 min pre-op 12/14/11 0527 12/14/11 0805          Assessment/Plan: Problem List: Patient Active Problem List  Diagnoses  . Abdominal pain  . 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 Thyroid mass    Doing well.  Will advance diet and bathe.   8 Days Post-Op    LOS: 8 days   Matt  B. Daphine Deutscher, MD, [facility] [phone] beeper [phone]  12/22/2011 12:05 PM

## 2011-12-22 NOTE — Progress Notes (Signed)
Spoke with Dr. Daphine Deutscher about a sleeping pill for tonight.  He stated he did not want to order at this time to see if the patient could tire herself out today with increased activity.  He stated that if she needs a sleeping pill tonight, then the night nurse can call the on-call physician.  No other concerns at this time.  Albesa Seen E 12:12 PM 12/22/2011

## 2011-12-22 NOTE — Progress Notes (Signed)
MEDICARE-CERTIFIED HOME HEALTH AGENCIES [place]   Agencies that are Medicare-Certified and affiliated with [facility]  Home Health Agency  Telephone Number Address  [facility]  [facility] has ownership interest in this company; however, you are under no obligation to use this agency. [phone]  [address] [place], [place] [address]    Agencies that are Medicare-Certified and are not affiliated with [facility]   Home Health Agency Telephone Number Address  [facility] [phone] Fax [phone] [address] [place], [place]  [address]  [facility] [phone] [address] [place], [place] [address]  [facility]  [phone] Fax [phone] [address]  [place], [place]  [address]  [facility] [phone] or [phone] [address] [place], [place] [address]  [facility] [phone] or [phone] [address] [place], [place]  [address]      Agencies that are not Medicare-Certified and are not affiliated with [facility]    Home Health Agency Telephone Number Address  [facility] [phone] Fax [phone] [address] [place], [place]  [address]  [facility] [phone] or [phone] Fax [phone] [address] [place], [place]  [address]  [facility]  [phone] [address] [place], [place]  [facility] [phone] Fax [phone] [address] [place], [place]  [address]  [facility] [phone] Fax [phone] [address] [place], [place]  [address]  [facility] [phone] [address] [place], [place]  [address]  [facility] [phone] Fax [phone] [address] [place], [place] [address]  [facility] [phone] [address] [place], [place]  [address]  [facility] [phone] Fax [phone] [address] , [place]  [address]    

## 2011-12-22 NOTE — Progress Notes (Signed)
CARE MANAGEMENT NOTE 12/22/2011  Patient:  Renee Nicholson, Renee Nicholson   Account Number:  [id number]  Date Initiated:  12/15/2011  Documentation initiated by:  Rachard Isidro  Subjective/Objective Assessment:   46 yo female admitted 12/14/11 with rectal cancer     Action/Plan:   D/C when medically stable   Anticipated DC Date:  12/25/2011   Anticipated DC Plan:  HOME W HOME HEALTH SERVICES      DC Planning Services  CM consult      [facility] Choice  HOME HEALTH   Choice offered to / List presented to:  C-1 Patient        HH arranged  HH-1 RN      [facility] agency  [facility]   Status of service:  In process, will continue to follow   Comments:  12/22/11, Kathi Der RNC-MNN, BSN, [phone], CM received referral.  CM met with pt and pt's sisters to offer [facility] services.  Pt and sisters have chosen [facility].  Darl Pikes at [facility] contacted with order and confirmation of services received. Pt plans to return to her home in [place] [facility] where she resides with her husband.

## 2011-12-23 MED ORDER — SUCRALFATE 1 GM/10ML PO SUSP
1.0000 g | Freq: Two times a day (BID) | ORAL | Status: DC
  Administered 2011-12-23 – 2011-12-25 (×5): 1 g via ORAL
  Filled 2011-12-23 (×6): qty 10

## 2011-12-23 NOTE — Progress Notes (Signed)
9 Days Post-Op  Subjective: Feels weak severe heartburn  Objective: Vital signs in last 24 hours: Temp:  [97.7 F (36.5 C)-100.9 F (38.3 C)] 98 F (36.7 C) (03/30 0726) Pulse Rate:  [67-83] 67  (03/30 0726) Resp:  [20] 20  (03/30 0726) BP: (128-155)/(78-83) 145/79 mmHg (03/30 0726) SpO2:  [96 %-100 %] 100 % (03/30 0726) Last BM Date: 12/22/11  Intake/Output from previous day: 03/29 0701 - 03/30 0700 In: 2907.5 [P.O.:720; I.V.:2087.5; IV Piggyback:100] Out: 870 [Urine:100; Drains:120; Stool:650] Intake/Output this shift:    wounds clean dry intact.  Ileostomy functioning.    Lab Results:   Basename 12/22/11 0413 12/21/11 0330  WBC 9.7 8.1  HGB 11.4* 10.1*  HCT 33.2* 28.7*  PLT 327 267   BMET  Basename 12/22/11 0413 12/21/11 0330  NA 134* 132*  K 3.8 3.0*  CL 97 97  CO2 26 23  GLUCOSE 116* 110*  BUN 5* 11  CREATININE 0.44* 0.50  CALCIUM 8.5 8.2*   PT/INR No results found for this basename: LABPROT:2,INR:2 in the last 72 hours ABG No results found for this basename: PHART:2,PCO2:2,PO2:2,HCO3:2 in the last 72 hours  Studies/Results: No results found.  Anti-infectives: Anti-infectives     Start     Dose/Rate Route Frequency Ordered Stop   12/18/11 1830   piperacillin-tazobactam (ZOSYN) IVPB 3.375 g        3.375 g 12.5 mL/hr over 240 Minutes Intravenous 3 times per day 12/18/11 1744     12/15/11 0800   ertapenem (INVANZ) 1 g in sodium chloride 0.9 % 50 mL IVPB        1 g 100 mL/hr over 30 Minutes Intravenous Every 24 hours 12/14/11 1537 12/15/11 0812   12/14/11 0527   ertapenem (INVANZ) 1 g in sodium chloride 0.9 % 50 mL IVPB        1 g 100 mL/hr over 30 Minutes Intravenous 60 min pre-op 12/14/11 0527 12/14/11 0805          Assessment/Plan: s/p Procedure(s) (LRB): LAPAROSCOPIC TOTAL ABDOMINAL COLECTOMY (N/A) ILEAL POUCH (N/A) DIVERTING ILEOSTOMY (N/A) GERD  Watch ileostomy output Continue diet   LOS: 9 days    Renee Nicholson  A. 12/23/2011

## 2011-12-24 LAB — COMPREHENSIVE METABOLIC PANEL
Alkaline Phosphatase: 71 U/L (ref 39–117)
BUN: 4 mg/dL — ABNORMAL LOW (ref 6–23)
Chloride: 100 mEq/L (ref 96–112)
Creatinine, Ser: 0.46 mg/dL — ABNORMAL LOW (ref 0.50–1.10)
GFR calc Af Amer: >90 mL/min (ref >90)
Glucose, Bld: 127 mg/dL — ABNORMAL HIGH (ref 70–99)
Potassium: 4.7 mEq/L (ref 3.5–5.1)
Total Bilirubin: 0.6 mg/dL (ref 0.3–1.2)
Total Protein: 5.8 g/dL — ABNORMAL LOW (ref 6.0–8.3)

## 2011-12-24 MED ORDER — PANTOPRAZOLE SODIUM 40 MG IV SOLR
40.0000 mg | Freq: Two times a day (BID) | INTRAVENOUS | Status: DC
  Administered 2011-12-24 – 2011-12-25 (×4): 40 mg via INTRAVENOUS
  Filled 2011-12-24 (×6): qty 40

## 2011-12-24 NOTE — Progress Notes (Signed)
Patient ID: Renee Nicholson, female   DOB: June 14, 1966, 46 y.o.   MRN: [id number] [facility] Progress Note:   10 Days Post-Op  Subjective: Mental status is clear.  Feeling tired.  Having some GER.   Objective: Vital signs in last 24 hours: Temp:  [98.6 F (37 C)-99.3 F (37.4 C)] 98.7 F (37.1 C) (03/31 0535) Pulse Rate:  [75-85] 77  (03/31 0535) Resp:  [16] 16  (03/31 0535) BP: (116-153)/(58-89) 116/58 mmHg (03/31 0535) SpO2:  [97 %-98 %] 97 % (03/31 0535)  Intake/Output from previous day: 03/30 0701 - 03/31 0700 In: 2107 [P.O.:360; I.V.:1747] Out: 700 [Drains:100; Stool:600] Intake/Output this shift: Total I/O In: 240 [P.O.:240] Out: 50 [Stool:50]  Physical Exam: Work of breathing is  Normal.  Ostomy functioning OK. JP serosanguinous.  Appetite marginal.  Wants a shower.  Lab Results:  Results for orders placed during the hospital encounter of 12/14/11 (from the past 48 hour(s))  COMPREHENSIVE METABOLIC PANEL     Status: Abnormal   Collection Time   12/24/11  3:55 AM      Component Value Range Comment   Sodium 133 (*) 135 - 145 (mEq/L)    Potassium 4.7  3.5 - 5.1 (mEq/L)    Chloride 100  96 - 112 (mEq/L)    CO2 28  19 - 32 (mEq/L)    Glucose, Bld 127 (*) 70 - 99 (mg/dL)    BUN 4 (*) 6 - 23 (mg/dL)    Creatinine, Ser 1.32 (*) 0.50 - 1.10 (mg/dL)    Calcium 8.7  8.4 - 10.5 (mg/dL)    Total Protein 5.8 (*) 6.0 - 8.3 (g/dL)    Albumin 2.7 (*) 3.5 - 5.2 (g/dL)    AST 42 (*) 0 - 37 (U/L)    ALT 59 (*) 0 - 35 (U/L)    Alkaline Phosphatase 71  39 - 117 (U/L)    Total Bilirubin 0.6  0.3 - 1.2 (mg/dL)    GFR calc non Af Amer >90  >90 (mL/min)    GFR calc Af Amer >90  >90 (mL/min)     Radiology/Results: No results found.  Anti-infectives: Anti-infectives     Start     Dose/Rate Route Frequency Ordered Stop   12/18/11 1830   piperacillin-tazobactam (ZOSYN) IVPB 3.375 g  Status:  Discontinued        3.375 g 12.5 mL/hr over 240 Minutes Intravenous 3 times per  day 12/18/11 1744 12/23/11 0840   12/15/11 0800   ertapenem (INVANZ) 1 g in sodium chloride 0.9 % 50 mL IVPB        1 g 100 mL/hr over 30 Minutes Intravenous Every 24 hours 12/14/11 1537 12/15/11 0812   12/14/11 0527   ertapenem (INVANZ) 1 g in sodium chloride 0.9 % 50 mL IVPB        1 g 100 mL/hr over 30 Minutes Intravenous 60 min pre-op 12/14/11 0527 12/14/11 0805          Assessment/Plan: Problem List: Patient Active Problem List  Diagnoses  . Abdominal pain  . 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 Thyroid mass    Improving.  Deconditioned.   10 Days Post-Op    LOS: 10 days   Matt B. Daphine Deutscher, MD, [facility] [phone] beeper [phone]  12/24/2011 10:45 AM

## 2011-12-25 ENCOUNTER — Telehealth (INDEPENDENT_AMBULATORY_CARE_PROVIDER_SITE_OTHER): Payer: Self-pay

## 2011-12-25 MED ORDER — SUCRALFATE 1 GM/10ML PO SUSP
1.0000 g | Freq: Three times a day (TID) | ORAL | Status: DC
  Administered 2011-12-25 – 2011-12-26 (×4): 1 g via ORAL
  Filled 2011-12-25 (×7): qty 10

## 2011-12-25 MED ORDER — GI COCKTAIL ~~LOC~~
30.0000 mL | Freq: Three times a day (TID) | ORAL | Status: DC | PRN
  Administered 2011-12-26: 30 mL via ORAL
  Filled 2011-12-25: qty 30

## 2011-12-25 MED ORDER — FLUCONAZOLE IN SODIUM CHLORIDE 200-0.9 MG/100ML-% IV SOLN
200.0000 mg | Freq: Once | INTRAVENOUS | Status: AC
  Administered 2011-12-25: 200 mg via INTRAVENOUS
  Filled 2011-12-25: qty 100

## 2011-12-25 MED ORDER — FLUCONAZOLE 100MG IVPB
100.0000 mg | INTRAVENOUS | Status: DC
  Administered 2011-12-26 – 2011-12-28 (×3): 100 mg via INTRAVENOUS
  Filled 2011-12-25 (×4): qty 50

## 2011-12-25 NOTE — Consult Note (Signed)
Ostomy follow-up: Pouch intact with good seal.  Pt states she will practice emptying pouch in bathroom today.  Supplies at bedside for staff use.   Will not plan to follow further unless re-consulted.  [address], RN, MSN, [employer]  [phone]

## 2011-12-25 NOTE — Telephone Encounter (Signed)
LM on home VM pt has appt with Dr. Donell Beers on 01/02/12 at 9:45am.

## 2011-12-25 NOTE — Progress Notes (Signed)
Patient ID: Renee Nicholson, female   DOB: 1966-08-09, 46 y.o.   MRN: [id number] [facility] Progress Note:     Subjective: Continues to complain of severe reflux and feeling like something is catching in her chest.  She feels hungry, but is having trouble eating.  Objective: Vital signs in last 24 hours: Temp:  [98.3 F (36.8 C)-99 F (37.2 C)] 99 F (37.2 C) (04/01 0454) Pulse Rate:  [83-89] 83  (04/01 0454) Resp:  [16-18] 16  (04/01 0454) BP: (121-150)/(81-83) 126/83 mmHg (04/01 0454) SpO2:  [97 %] 97 % (04/01 0454)  Intake/Output from previous day: 03/31 0701 - 04/01 0700 In: 2231.3 [P.O.:480; I.V.:1751.3] Out: 600 [Drains:50; Stool:550] Intake/Output this shift:    Physical Exam: Work of breathing is  Normal.  Ostomy functioning OK.  Output less liquidy. JP serosanguinous.    Lab Results:  Results for orders placed during the hospital encounter of 12/14/11 (from the past 48 hour(s))  COMPREHENSIVE METABOLIC PANEL     Status: Abnormal   Collection Time   12/24/11  3:55 AM      Component Value Range Comment   Sodium 133 (*) 135 - 145 (mEq/L)    Potassium 4.7  3.5 - 5.1 (mEq/L)    Chloride 100  96 - 112 (mEq/L)    CO2 28  19 - 32 (mEq/L)    Glucose, Bld 127 (*) 70 - 99 (mg/dL)    BUN 4 (*) 6 - 23 (mg/dL)    Creatinine, Ser 4.09 (*) 0.50 - 1.10 (mg/dL)    Calcium 8.7  8.4 - 10.5 (mg/dL)    Total Protein 5.8 (*) 6.0 - 8.3 (g/dL)    Albumin 2.7 (*) 3.5 - 5.2 (g/dL)    AST 42 (*) 0 - 37 (U/L)    ALT 59 (*) 0 - 35 (U/L)    Alkaline Phosphatase 71  39 - 117 (U/L)    Total Bilirubin 0.6  0.3 - 1.2 (mg/dL)    GFR calc non Af Amer >90  >90 (mL/min)    GFR calc Af Amer >90  >90 (mL/min)     Radiology/Results: No results found.  Anti-infectives: Anti-infectives     Start     Dose/Rate Route Frequency Ordered Stop   12/18/11 1830   piperacillin-tazobactam (ZOSYN) IVPB 3.375 g  Status:  Discontinued        3.375 g 12.5 mL/hr over 240 Minutes Intravenous 3  times per day 12/18/11 1744 12/23/11 0840   12/15/11 0800   ertapenem (INVANZ) 1 g in sodium chloride 0.9 % 50 mL IVPB        1 g 100 mL/hr over 30 Minutes Intravenous Every 24 hours 12/14/11 1537 12/15/11 0812   12/14/11 0527   ertapenem (INVANZ) 1 g in sodium chloride 0.9 % 50 mL IVPB        1 g 100 mL/hr over 30 Minutes Intravenous 60 min pre-op 12/14/11 0527 12/14/11 0805          Assessment/Plan: Problem List: Patient Active Problem List  Diagnoses  . Abdominal pain  . 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 Thyroid mass    GI cocktail UGI to eval sense of dysphagia. Increase protonix. Increase carafate. Leave on IV Fluids, since poor PO intake. Not ready to go home.    12/25/2011 9:57 AM

## 2011-12-25 NOTE — Consult Note (Signed)
Referring Provider: Dr. Donell Nicholson Primary Care Physician:  Renee Nicholson., MD, MD Primary Gastroenterologist:  Dr. Jonette Nicholson  Reason for Consultation:  Dysphagia, Odynophagia  HPI: Renee Nicholson is a 46 y.o. female who has T3N1 rectal cancer and is s/p neoadjuvant chemotherapy and radiation, who had surgery for her rectal cancer on 12/14/11 with a laparoscopic total proctocolectomy with ileal  pouch-anal anastomosis and diverting ileostomy, placement of rectal tube. Postop she has developed severe burning of her throat and severe pain with swallowing. She can only take a couple of sips of fluid or bites of food and then will feel severe pain and sensation that rice hangs up. Burning is constant throughout the day and no help with IV Protonix. Denies any trouble swallowing or heartburn prior to hospitalization. Her chemotherapy was Xeloda and that completed in January 2013. Her family member says she can only drink 2 sips of Ensure before the burning and pain keeps her from swallowing anymore and can only eats 1-2 bites of rice before she feels that it hangs up.     Past Medical History  Diagnosis Date  . Anemia   . Diarrhea   . Rectal cancer 08/16/2011  . Colon cancer   . Anxiety   . Bright red rectal bleeding 09/13/2011  . History of chemotherapy     completed 09/2011     Past Surgical History  Procedure Date  . Tubal ligation   . Carpal tunnel release   . Colonoscopy 07/26/2011    Procedure: COLONOSCOPY;  Surgeon: Renee Harman, MD;  Location: [facility];  Service: Endoscopy;  Laterality: N/A;  10:40  . Esophagogastroduodenoscopy 07/26/2011    Procedure: ESOPHAGOGASTRODUODENOSCOPY (EGD);  Surgeon: Renee Harman, MD;  Location: [facility];  Service: Endoscopy;  Laterality: N/A;    Prior to Admission medications   Medication Sig Start Date End Date Taking? Authorizing Provider  Calcium Carb-Cholecalciferol (CALCIUM 500 +D) 500-400 MG-UNIT TABS Take 1 tablet by mouth  every morning.    Yes Historical Provider, MD  Multiple Vitamin (MULTIVITAMIN) tablet Take 1 tablet by mouth every morning.    Yes Historical Provider, MD  polysaccharide iron (NIFEREX) 150 MG CAPS capsule Take 150 mg by mouth every morning.    Yes Historical Provider, MD  simethicone (MYLICON) 80 MG chewable tablet Chew 80 mg by mouth every 6 (six) hours as needed. For gas   Yes Historical Provider, MD  capecitabine (XELODA) 150 MG tablet Take by mouth 2 (two) times daily after a meal.     Historical Provider, MD  capecitabine (XELODA) 500 MG tablet Take by mouth 2 (two) times daily after a meal.     Historical Provider, MD  diphenoxylate-atropine (LOMOTIL) 2.5-0.025 MG per tablet Take 1 tablet by mouth 4 (four) times daily as needed. For stomach    Historical Provider, MD  oxyCODONE-acetaminophen (PERCOCET) 5-325 MG per tablet Take 1 tablet by mouth every 4 (four) hours as needed. For pain    Historical Provider, MD    Scheduled Meds:   . enoxaparin  40 mg Subcutaneous Q24H  . fluconazole (DIFLUCAN) IV  100 mg Intravenous Q24H  . fluconazole (DIFLUCAN) IV  200 mg Intravenous Once  . loperamide  4 mg Oral TID  . pantoprazole (PROTONIX) IV  40 mg Intravenous Q12H  . potassium chloride  40 mEq Oral BID  . sucralfate  1 g Oral TID WC & HS  . DISCONTD: sucralfate  1 g Oral BID   Continuous Infusions:   .  0.9 % NaCl with KCl 40 mEq / L 75 mL/hr (12/25/11 4098)  . DISCONTD: sodium chloride 75 mL/hr at 12/22/11 0035   PRN Meds:.acetaminophen, alum & mag hydroxide-simeth, diphenhydrAMINE, diphenhydrAMINE, diphenoxylate-atropine, feeding supplement, gi cocktail, morphine injection, naloxone, ondansetron (ZOFRAN) IV, ondansetron (ZOFRAN) IV, ondansetron, oxyCODONE-acetaminophen, sodium chloride  Allergies as of 10/30/2011  . (No Known Allergies)    Family History  Problem Relation Age of Onset  . Diabetes Mother   . Colon cancer Neg Hx   . Cancer Brother 35    rectal cancer lives in [place]     History   Social History  . Marital Status: Married    Spouse Name: N/A    Number of Children: 2  . Years of Education: N/A   Occupational History  . self employed, [employer]    Social History Main Topics  . Smoking status: Never Smoker   . Smokeless tobacco: Never Used  . Alcohol Use: No  . Drug Use: No  . Sexually Active: Yes   Other Topics Concern  . Not on file   Social History Narrative  . No narrative on file    Review of Systems: All negative except as stated above in HPI.  Physical Exam: Vital signs: Filed Vitals:   12/25/11 0454  BP: 126/83  Pulse: 83  Temp: 99 F (37.2 C)  Resp: 16   Last BM Date: 12/22/11 General:   Alert,  Thin, pleasant and cooperative in NAD Lungs:  Clear throughout to auscultation.    Heart:  Regular rate and rhythm; no murmurs, clicks, rubs,  or gallops. Abdomen: ostomy bag intact, JP drain noted, soft, nontender, nondistended Rectal:  Deferred  GI:  Lab Results: No results found for this basename: WBC:3,HGB:3,HCT:3,PLT:3 in the last 72 hours BMET  [facility] 12/24/11 0355  NA 133*  K 4.7  CL 100  CO2 28  GLUCOSE 127*  BUN 4*  CREATININE 0.46*  CALCIUM 8.7   LFT  Basename 12/24/11 0355  PROT 5.8*  ALBUMIN 2.7*  AST 42*  ALT 59*  ALKPHOS 71  BILITOT 0.6  BILIDIR --  IBILI --   PT/INR No results found for this basename: LABPROT:2,INR:2 in the last 72 hours   Studies/Results: No results found.  Impression/Plan: 46yo [place] woman with T3 N1 rectal cancer and s/p neoadjuvant chemo/radiation and had IPAA surgery on 12/14/11. She has developed severe dysphagia/odynophagia/heartburn postop and I think the most likely source is Candida esophagitis. Doubt any obstructive process and will treat empirically with IV Diflucan as the first step. Will cancel the UGIS that was ordered and see if her symptoms improve with the Diflucan. If no better with the IV Diflucan within 24-36 hours then will do an endoscopy to  further evaluate. Continue Carafate and IV Protonix. Change to clear liquids.    LOS: 11 days   Renee Nicholson C.  12/25/2011, 2:08 PM

## 2011-12-26 LAB — CBC
Hemoglobin: 11.4 g/dL — ABNORMAL LOW (ref 12.0–15.0)
MCH: 27.9 pg (ref 26.0–34.0)
RBC: 4.08 MIL/uL (ref 3.87–5.11)

## 2011-12-26 LAB — COMPREHENSIVE METABOLIC PANEL
AST: 39 U/L — ABNORMAL HIGH (ref 0–37)
CO2: 25 mEq/L (ref 19–32)
Calcium: 8.8 mg/dL (ref 8.4–10.5)
Creatinine, Ser: 0.46 mg/dL — ABNORMAL LOW (ref 0.50–1.10)
GFR calc non Af Amer: >90 mL/min (ref >90)

## 2011-12-26 MED ORDER — PANTOPRAZOLE SODIUM 40 MG PO TBEC
40.0000 mg | DELAYED_RELEASE_TABLET | Freq: Two times a day (BID) | ORAL | Status: DC
  Administered 2011-12-26 (×2): 40 mg via ORAL
  Filled 2011-12-26 (×4): qty 1

## 2011-12-26 NOTE — Progress Notes (Signed)
JP drain d/c'd with no issues. Some serosanguinous drainage from site. Site dressed and RN notified to monitor amount of drainage.

## 2011-12-26 NOTE — Progress Notes (Signed)
Patient ID: Renee Nicholson, female   DOB: 06/12/1966, 45 y.o.   MRN: [id number] [facility] Progress Note  Renee Nicholson 45 y.o. 10/21/1965   Subjective: No significant improvement but able to swallow 3-4 bites of food or liquid vs. 1-2 before but still having severe burning and dysphagia/odynophagia. Diflucan started yesterday afternoon for presumed Candida esophagitis.  Objective: Vital signs: Filed Vitals:   12/26/11 0548  BP: 117/79  Pulse: 83  Temp: 98.4 F (36.9 C)  Resp: 18    Intake/Output last 24 hrs:  Intake/Output Summary (Last 24 hours) at 12/26/11 1129 Last data filed at 12/26/11 0807  Gross per 24 hour  Intake    604 ml  Output    525 ml  Net     79 ml    Physical Exam: Gen: alert, no acute distress, thin  Lab Results:  Basename 12/26/11 0348 12/24/11 0355  NA 131* 133*  K 4.7 4.7  CL 96 100  CO2 25 28  GLUCOSE 120* 127*  BUN 5* 4*  CREATININE 0.46* 0.46*  CALCIUM 8.8 8.7  MG -- --  PHOS -- --    Basename 12/26/11 0348 12/24/11 0355  AST 39* 42*  ALT 51* 59*  ALKPHOS 77 71  BILITOT 0.4 0.6  PROT 5.6* 5.8*  ALBUMIN 2.5* 2.7*    Basename 12/26/11 0348  WBC 9.4  NEUTROABS --  HGB 11.4*  HCT 34.1*  MCV 83.6  PLT 391    Assessment/Plan: 45yo with T3N1 Rectal cancer s/p IPAA on 3/21 with severe dysphagia/odynophagia/heartburn. Diflucan empirically started for possible Candida esophagitis. EGD planned for tomorrow AM to further evaluate if no significant improvements in swallowing today on the Diflucan. Will d/c Carafate since could affect appearance of esophageal lining on EGD and mimic yeast. Continue IV Protonix.   Cylan Borum C. 12/26/2011, 11:29 AM   

## 2011-12-26 NOTE — Progress Notes (Signed)
The patient is receiving Protonix by the intravenous route.  Based on criteria approved by the Pharmacy and Therapeutics Committee and the Medical Executive Committee, the medication is being converted to the equivalent oral dose form.  These criteria include: -No Active GI bleeding -Able to tolerate diet of full liquids (or better) or tube feeding -Able to tolerate other medications by the oral or enteral route  If you have any questions about this conversion, please contact the Pharmacy Department (phone [phone]).  Thank you.   Chilton Si, Ermal Brzozowski L 10:25 AM

## 2011-12-26 NOTE — Consult Note (Signed)
WOC ostomy consult  Stoma type/location: RLQ ileostomy Stomal assessment/size: 1 and 1/2 inches round Peristomal assessment: intact Treatment options for stomal/peristomal skin: none indicated Output light brown effluent Ostomy pouching: 1pc. CONVEX ostomy pouch (drainable) with aperture cut to 1 and 1/2 inches.  Patient independent in closing tail closure (Lock n' Roll).  Education provided: Patient observed pouch removal, taught to inspect back of skin barrier to determine if leakage had occurred or if system could go longer (another day).  Patient observed pouch application.   Next pouch change due on Friday, April 5.  Patient complaining of esophageal discomfort and continued inability to eat or drink.   Thanks, I will follow along with you, visiting for support, education and pouch change on Friday. Ladona Mow, MSN, RN, [facility], CWOCN [phone])

## 2011-12-26 NOTE — Progress Notes (Signed)
Patient ID: Renee Nicholson, female   DOB: August 27, 1966, 46 y.o.   MRN: [id number] [facility] Surgery Progress Note:     Subjective: Dr. Bosie Clos evaluated and thinks dysphagia classic for candidal esophagitis.  Started on diflucan yesterday.  Small amount of improvement.  Objective: Vital signs in last 24 hours: Temp:  [98.2 F (36.8 C)-99 F (37.2 C)] 98.4 F (36.9 C) (04/02 0548) Pulse Rate:  [83-98] 83  (04/02 0548) Resp:  [16-18] 18  (04/02 0548) BP: (117-125)/(79-86) 117/79 mmHg (04/02 0548) SpO2:  [97 %-98 %] 98 % (04/02 0548)  Intake/Output from previous day: 04/01 0701 - 04/02 0700 In: 604 [I.V.:504; IV Piggyback:100] Out: 475 [Stool:475] Intake/Output this shift: Total I/O In: -  Out: 50 [Stool:50]  Physical Exam: Abdomen soft, non tender, non distended.  Gas/stool in ostomy bag.  Drain serous.  Lab Results:  Results for orders placed during the hospital encounter of 12/14/11 (from the past 48 hour(s))  COMPREHENSIVE METABOLIC PANEL     Status: Abnormal   Collection Time   12/26/11  3:48 AM      Component Value Range Comment   Sodium 131 (*) 135 - 145 (mEq/L)    Potassium 4.7  3.5 - 5.1 (mEq/L)    Chloride 96  96 - 112 (mEq/L)    CO2 25  19 - 32 (mEq/L)    Glucose, Bld 120 (*) 70 - 99 (mg/dL)    BUN 5 (*) 6 - 23 (mg/dL)    Creatinine, Ser 1.47 (*) 0.50 - 1.10 (mg/dL)    Calcium 8.8  8.4 - 10.5 (mg/dL)    Total Protein 5.6 (*) 6.0 - 8.3 (g/dL)    Albumin 2.5 (*) 3.5 - 5.2 (g/dL)    AST 39 (*) 0 - 37 (U/L)    ALT 51 (*) 0 - 35 (U/L)    Alkaline Phosphatase 77  39 - 117 (U/L)    Total Bilirubin 0.4  0.3 - 1.2 (mg/dL)    GFR calc non Af Amer >90  >90 (mL/min)    GFR calc Af Amer >90  >90 (mL/min)   CBC     Status: Abnormal   Collection Time   12/26/11  3:48 AM      Component Value Range Comment   WBC 9.4  4.0 - 10.5 (K/uL)    RBC 4.08  3.87 - 5.11 (MIL/uL)    Hemoglobin 11.4 (*) 12.0 - 15.0 (g/dL)    HCT 82.9 (*) 56.2 - 46.0 (%)    MCV 83.6  78.0 -  100.0 (fL)    MCH 27.9  26.0 - 34.0 (pg)    MCHC 33.4  30.0 - 36.0 (g/dL)    RDW 13.0  86.5 - 78.4 (%)    Platelets 391  150 - 400 (K/uL)     Radiology/Results: No results found.  Anti-infectives: Anti-infectives     Start     Dose/Rate Route Frequency Ordered Stop   12/26/11 0800   fluconazole (DIFLUCAN) IVPB 100 mg        100 mg 50 mL/hr over 60 Minutes Intravenous Every 24 hours 12/25/11 1408     12/25/11 1500   fluconazole (DIFLUCAN) IVPB 200 mg        200 mg 100 mL/hr over 60 Minutes Intravenous  Once 12/25/11 1408 12/25/11 1628   12/18/11 1830   piperacillin-tazobactam (ZOSYN) IVPB 3.375 g  Status:  Discontinued        3.375 g 12.5 mL/hr over 240 Minutes Intravenous 3 times  per day 12/18/11 1744 12/23/11 0840   12/15/11 0800   ertapenem (INVANZ) 1 g in sodium chloride 0.9 % 50 mL IVPB        1 g 100 mL/hr over 30 Minutes Intravenous Every 24 hours 12/14/11 1537 12/15/11 0812   12/14/11 0527   ertapenem (INVANZ) 1 g in sodium chloride 0.9 % 50 mL IVPB        1 g 100 mL/hr over 30 Minutes Intravenous 60 min pre-op 12/14/11 0527 12/14/11 0805          Assessment/Plan: Problem List: Patient Active Problem List  Diagnoses  . Abdominal pain  . 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 Thyroid mass    GI cocktail IV diflucan Await improvement of dysphagia for pt's PO intake to improve. Ostomy output less watery. Once dysphagia improves, and diet picks up, will likely be able to go home.  Will need to make sure ostomy output does not dramatically pick up with diet.    12/26/2011 9:02 AM

## 2011-12-27 ENCOUNTER — Encounter (HOSPITAL_COMMUNITY)
Admission: RE | Disposition: A | Discharge status: Home Health | Payer: Self-pay | Source: Ambulatory Visit | Attending: General Surgery

## 2011-12-27 ENCOUNTER — Encounter (HOSPITAL_COMMUNITY): Payer: Self-pay | Admitting: Gastroenterology

## 2011-12-27 HISTORY — PX: ESOPHAGOGASTRODUODENOSCOPY: SHX5428

## 2011-12-27 SURGERY — EGD (ESOPHAGOGASTRODUODENOSCOPY)
Anesthesia: Moderate Sedation

## 2011-12-27 MED ORDER — FENTANYL CITRATE 0.05 MG/ML IJ SOLN
INTRAMUSCULAR | Status: AC
  Filled 2011-12-27: qty 2

## 2011-12-27 MED ORDER — SUCRALFATE 1 GM/10ML PO SUSP
1.0000 g | Freq: Three times a day (TID) | ORAL | Status: DC
  Administered 2011-12-27 – 2012-01-09 (×47): 1 g via ORAL
  Filled 2011-12-27 (×55): qty 10

## 2011-12-27 MED ORDER — FENTANYL NICU IV SYRINGE 50 MCG/ML
INJECTION | INTRAMUSCULAR | Status: DC | PRN
  Administered 2011-12-27: 15 ug via INTRAVENOUS
  Administered 2011-12-27: 10 ug via INTRAVENOUS
  Administered 2011-12-27: 25 ug via INTRAVENOUS

## 2011-12-27 MED ORDER — MIDAZOLAM HCL 10 MG/2ML IJ SOLN
INTRAMUSCULAR | Status: DC | PRN
  Administered 2011-12-27 (×2): 2 mg via INTRAVENOUS

## 2011-12-27 MED ORDER — MIDAZOLAM HCL 10 MG/2ML IJ SOLN
INTRAMUSCULAR | Status: AC
  Filled 2011-12-27: qty 2

## 2011-12-27 MED ORDER — NYSTATIN 100000 UNIT/ML MT SUSP
10.0000 mL | Freq: Four times a day (QID) | OROMUCOSAL | Status: AC
  Administered 2011-12-27 – 2011-12-29 (×9): 1000000 [IU] via ORAL
  Filled 2011-12-27 (×3): qty 10
  Filled 2011-12-27: qty 60
  Filled 2011-12-27: qty 10

## 2011-12-27 MED ORDER — PANTOPRAZOLE SODIUM 40 MG IV SOLR
40.0000 mg | Freq: Two times a day (BID) | INTRAVENOUS | Status: DC
  Administered 2011-12-27 – 2012-01-07 (×24): 40 mg via INTRAVENOUS
  Filled 2011-12-27 (×27): qty 40

## 2011-12-27 MED ORDER — BOOST / RESOURCE BREEZE PO LIQD
1.0000 | Freq: Three times a day (TID) | ORAL | Status: DC
  Administered 2011-12-27 – 2012-01-07 (×14): 1 via ORAL

## 2011-12-27 MED ORDER — BOOST / RESOURCE BREEZE PO LIQD: 1.0000 | ORAL | Status: DC | PRN

## 2011-12-27 NOTE — Brief Op Note (Signed)
Severe erosive esophagitis -question yeast vs. Reflux. Brushings pending. See endopro note for complete details and recs.

## 2011-12-27 NOTE — Progress Notes (Signed)
Nutrition Follow-up  Diet Order: Clear liquid  - POD# 13 laparoscopic total abdominal colectomy, ileal pouch, and diverting ileostomy. Noted pt with severe reflux earlier in the week with difficulty eating. GI was consulted for this postop severe burning of throat and pain with swallowing. This has prevented her from eating more than a few bites of rice or drinking a 2 sips of Ensure per GI notes. Diflucan was started on 4/1 for presumed Candida esophagitis. EGD performed today showed severe erosive esophagitis per notes. Nystatin was ordered, and IV Diflucan and Protonix were ordered to continue, and to check for H. Pylori. Calorie count ordered to begin today, doubt pt will consume enough to meet needs as pt on clear liquid diet. Explained calorie count to nursing who verbalized understanding. Met with pt who reports pain with swallowing improved, observed pt with Resource Breeze at bedside which pt states she has been enjoying. Explained to pt what a calorie count is and pt expressed understanding.    Meds: Scheduled Meds:   . enoxaparin  40 mg Subcutaneous Q24H  . fluconazole (DIFLUCAN) IV  100 mg Intravenous Q24H  . loperamide  4 mg Oral TID  . nystatin  10 mL Oral QID  . pantoprazole (PROTONIX) IV  40 mg Intravenous Q12H  . potassium chloride  40 mEq Oral BID  . sucralfate  1 g Oral TID WC & HS  . DISCONTD: pantoprazole  40 mg Oral BID AC   Continuous Infusions:   . 0.9 % NaCl with KCl 40 mEq / L 30 mL/hr at 12/26/11 1616   PRN Meds:.acetaminophen, alum & mag hydroxide-simeth, diphenhydrAMINE, diphenhydrAMINE, diphenoxylate-atropine, feeding supplement, fentaNYL, gi cocktail, midazolam, morphine injection, naloxone, ondansetron (ZOFRAN) IV, ondansetron (ZOFRAN) IV, ondansetron, oxyCODONE-acetaminophen, sodium chloride  Labs:  CMP     Component Value Date/Time   NA 131* 12/26/2011 0348   K 4.7 12/26/2011 0348   CL 96 12/26/2011 0348   CO2 25 12/26/2011 0348   GLUCOSE 120* 12/26/2011 0348   BUN 5* 12/26/2011 0348   CREATININE 0.46* 12/26/2011 0348   CREATININE 0.70 07/27/2011 1306   CALCIUM 8.8 12/26/2011 0348   PROT 5.6* 12/26/2011 0348   ALBUMIN 2.5* 12/26/2011 0348   AST 39* 12/26/2011 0348   ALT 51* 12/26/2011 0348   ALKPHOS 77 12/26/2011 0348   BILITOT 0.4 12/26/2011 0348   GFRNONAA >90 12/26/2011 0348   GFRAA >90 12/26/2011 0348     Intake/Output Summary (Last 24 hours) at 12/27/11 1402 Last data filed at 12/27/11 0933  Gross per 24 hour  Intake     50 ml  Output    500 ml  Net   -450 ml   Ileostomy - total output so far today  Weight Status:   3/21 98 lb 3/25 108 lb 7.5 oz 4/3 98 lb   Re-estimated needs: No changes. 8295-6213 calories 73.6-88.36g protein  Nutrition Dx: Inadequate oral intake - ongoing  Goal:  1. PO intake >75% at meals and supplements - not met.  2. Meet >90% of estimated energy needs - not met.   New goal: Diet advancement as tolerated to bland diet.   Intervention: Resource Breeze TID and PRN. RD to monitor and analyze calorie count. Provided education on nutrition therapy for esophagitis - included handout of this information.   Monitor: Weights, labs, intake, esophagitis    Marshall Cork Pager #: [phone]

## 2011-12-27 NOTE — H&P (View-Only) (Signed)
Patient ID: Renee Nicholson, female   DOB: 06/26/1966, 46 y.o.   MRN: [id number] [facility] Gastroenterology Progress Note  Renee Nicholson 45 y.o. October 23, 1965   Subjective: No significant improvement but able to swallow 3-4 bites of food or liquid vs. 1-2 before but still having severe burning and dysphagia/odynophagia. Diflucan started yesterday afternoon for presumed Candida esophagitis.  Objective: Vital signs: Filed Vitals:   12/26/11 0548  BP: 117/79  Pulse: 83  Temp: 98.4 F (36.9 C)  Resp: 18    Intake/Output last 24 hrs:  Intake/Output Summary (Last 24 hours) at 12/26/11 1129 Last data filed at 12/26/11 0807  Gross per 24 hour  Intake    604 ml  Output    525 ml  Net     79 ml    Physical Exam: Gen: alert, no acute distress, thin  Lab Results:  [facility] 12/26/11 0348 12/24/11 0355  NA 131* 133*  K 4.7 4.7  CL 96 100  CO2 25 28  GLUCOSE 120* 127*  BUN 5* 4*  CREATININE 0.46* 0.46*  CALCIUM 8.8 8.7  MG -- --  PHOS -- --    Basename 12/26/11 0348 12/24/11 0355  AST 39* 42*  ALT 51* 59*  ALKPHOS 77 71  BILITOT 0.4 0.6  PROT 5.6* 5.8*  ALBUMIN 2.5* 2.7*    Basename 12/26/11 0348  WBC 9.4  NEUTROABS --  HGB 11.4*  HCT 34.1*  MCV 83.6  PLT 391    Assessment/Plan: 45yo with T3N1 Rectal cancer s/p IPAA on 3/21 with severe dysphagia/odynophagia/heartburn. Diflucan empirically started for possible Candida esophagitis. EGD planned for tomorrow AM to further evaluate if no significant improvements in swallowing today on the Diflucan. Will d/c Carafate since could affect appearance of esophageal lining on EGD and mimic yeast. Continue IV Protonix.   Bhavika Schnider C. 12/26/2011, 11:29 AM

## 2011-12-27 NOTE — Plan of Care (Signed)
Problem: Inadequate Intake (NI-2.1) Goal: Food and/or nutrient delivery Individualized approach for food/nutrient provision.  Outcome: Not Progressing On clear liquids, minimal intake r/t severe esophagitis

## 2011-12-27 NOTE — Progress Notes (Signed)
Patient ID: Renee Nicholson, female   DOB: [birth year], 46 y.o.   MRN: [id number] [facility] Surgery Progress Note:     Subjective: Endo this AM for worsening dysphagia.  Significant esophagitis seen.  Objective: Vital signs in last 24 hours: Temp:  [98.2 F (36.8 C)-98.9 F (37.2 C)] 98.2 F (36.8 C) (04/03 0726) Pulse Rate:  [88-104] 94  (04/03 0726) Resp:  [16-31] 22  (04/03 0920) BP: (85-129)/(45-83) 94/59 mmHg (04/03 0920) SpO2:  [83 %-100 %] 83 % (04/03 0920) Weight:  [98 lb (44.453 kg)] 98 lb (44.453 kg) (04/03 0726)  Intake/Output from previous day: 04/02 0701 - 04/03 0700 In: 50  Out: 600 [Stool:550] Intake/Output this shift: Total I/O In: -  Out: 100 [Stool:100]  Physical Exam: Pt still sleepy from sedation for endo. Abdomen soft, non tender, non distended.  Gas/stool in ostomy bag.   Lab Results:  Results for orders placed during the hospital encounter of 12/14/11 (from the past 48 hour(s))  COMPREHENSIVE METABOLIC PANEL     Status: Abnormal   Collection Time   12/26/11  3:48 AM      Component Value Range Comment   Sodium 131 (*) 135 - 145 (mEq/L)    Potassium 4.7  3.5 - 5.1 (mEq/L)    Chloride 96  96 - 112 (mEq/L)    CO2 25  19 - 32 (mEq/L)    Glucose, Bld 120 (*) 70 - 99 (mg/dL)    BUN 5 (*) 6 - 23 (mg/dL)    Creatinine, Ser 9.62 (*) 0.50 - 1.10 (mg/dL)    Calcium 8.8  8.4 - 10.5 (mg/dL)    Total Protein 5.6 (*) 6.0 - 8.3 (g/dL)    Albumin 2.5 (*) 3.5 - 5.2 (g/dL)    AST 39 (*) 0 - 37 (U/L)    ALT 51 (*) 0 - 35 (U/L)    Alkaline Phosphatase 77  39 - 117 (U/L)    Total Bilirubin 0.4  0.3 - 1.2 (mg/dL)    GFR calc non Af Amer >90  >90 (mL/min)    GFR calc Af Amer >90  >90 (mL/min)   CBC     Status: Abnormal   Collection Time   12/26/11  3:48 AM      Component Value Range Comment   WBC 9.4  4.0 - 10.5 (K/uL)    RBC 4.08  3.87 - 5.11 (MIL/uL)    Hemoglobin 11.4 (*) 12.0 - 15.0 (g/dL)    HCT 95.2 (*) 84.1 - 46.0 (%)    MCV 83.6  78.0 - 100.0 (fL)      MCH 27.9  26.0 - 34.0 (pg)    MCHC 33.4  30.0 - 36.0 (g/dL)    RDW 32.4  40.1 - 02.7 (%)    Platelets 391  150 - 400 (K/uL)     Radiology/Results: No results found.  Anti-infectives: Anti-infectives     Start     Dose/Rate Route Frequency Ordered Stop   12/26/11 0800   fluconazole (DIFLUCAN) IVPB 100 mg        100 mg 50 mL/hr over 60 Minutes Intravenous Every 24 hours 12/25/11 1408     12/25/11 1500   fluconazole (DIFLUCAN) IVPB 200 mg        200 mg 100 mL/hr over 60 Minutes Intravenous  Once 12/25/11 1408 12/25/11 1628   12/18/11 1830   piperacillin-tazobactam (ZOSYN) IVPB 3.375 g  Status:  Discontinued        3.375 g 12.5 mL/hr  over 240 Minutes Intravenous 3 times per day 12/18/11 1744 12/23/11 0840   12/15/11 0800   ertapenem (INVANZ) 1 g in sodium chloride 0.9 % 50 mL IVPB        1 g 100 mL/hr over 30 Minutes Intravenous Every 24 hours 12/14/11 1537 12/15/11 0812   12/14/11 0527   ertapenem (INVANZ) 1 g in sodium chloride 0.9 % 50 mL IVPB        1 g 100 mL/hr over 30 Minutes Intravenous 60 min pre-op 12/14/11 0527 12/14/11 0805          Assessment/Plan: Problem List: Patient Active Problem List  Diagnoses  . Abdominal pain  . 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 Thyroid mass   IV diflucan/nystatin S&S Await improvement of dysphagia for pt's PO intake to improve. Ostomy output less watery. Once dysphagia improves, and diet picks up, will likely be able to go home.  Will need to make sure ostomy output does not dramatically pick up with diet.   Calorie count.  12/27/2011 11:18 AM

## 2011-12-27 NOTE — Op Note (Signed)
[facility] [address] [place], [place]  [address]  ENDOSCOPY PROCEDURE REPORT  PATIENT:  Renee, Nicholson  MR#:  [id number] BIRTHDATE:  Feb 12, 1966, 45 yrs. old  GENDER:  female  ENDOSCOPIST:  Charlott Rakes, MD Referred by:  [name], M.D.  PROCEDURE DATE:  12/27/2011 PROCEDURE:  EGD w/brushings ASA CLASS:  Class III INDICATIONS:  dysphagia, odynophagia  MEDICATIONS:   Fentanyl 50 mcg IV, Versed 4 mg IV, Cetacaine spray x 2  TOPICAL ANESTHETIC:  DESCRIPTION OF PROCEDURE:   After the risks benefits and alternatives of the procedure were thoroughly explained, informed consent was obtained.  The Pentax Gastroscope [id number] endoscope was introduced through the mouth and advanced to the second portion of the duodenum, without limitations.  The instrument was slowly withdrawn as the mucosa was fully examined. <<PROCEDUREIMAGES>>  FINDINGS:  The endoscope was inserted into the oropharynx and esophagus was intubated.  A long segment of esophagus was noted to be ulcerated and inflamed and this inflammatory segment extended from 25 cm from the incisors to 31 cm from the incisors. There was near circumferential flat whitish mucosa with a focal area of plaque like appearance. The gastroesophageal junction was noted to be 36 cm from the incisors.  Endoscope was advanced into the stomach and on the lesser curvature of the stomach was a 6 mm clean-based ulcer with surrounding edema. The endoscope was advanced to the duodenal bulb and second portion of duodenum which were unremarkable.  The endoscope was withdrawn back into the stomach and retroflexion revealed a small hiatal hernia. A large amount of bilious fluid in the dependent portion of the stomach which prevented complete visualization of the greater curvature. On withdrawal back into the esophagus esophageal brushings were taken to send for cytology. The inflamed segment was very friable to the esophageal  brushing.  COMPLICATIONS:  None  ENDOSCOPIC IMPRESSION:  1. Long segment of esophagitis question yeast versus herpes versus reflux. Herpes less likely but cytology will be checked for both yeast and herpes 2. Clean-based gastric ulcer 3. Small hiatal hernia  RECOMMENDATIONS:   1. Follow up on esophageal brushing 2. Add nystatin and continue IV Diflucan 3. Continue IV Protonix 4. Check H. pylori IgG serology and treat if positive  REPEAT EXAM:  N/A  ______________________________ Charlott Rakes, MD  CC:  [name], MD  n. Rosalie DoctorCharlott Rakes at 12/27/2011 09:01 AM  Page 2 of 3   Claria Dice, [phone]

## 2011-12-27 NOTE — Interval H&P Note (Signed)
History and Physical Interval Note:  12/27/2011 8:26 AM  Renee Nicholson  has presented today for surgery, with the diagnosis of dys  The various methods of treatment have been discussed with the patient and family. After consideration of risks, benefits and other options for treatment, the patient has consented to  Procedure(s) (LRB): ESOPHAGOGASTRODUODENOSCOPY (EGD) (N/A) as a surgical intervention .  The patients' history has been reviewed, patient examined, no change in status, stable for surgery.  I have reviewed the patients' chart and labs.  Questions were answered to the patient's satisfaction.     Valori Hollenkamp C.

## 2011-12-28 ENCOUNTER — Encounter (HOSPITAL_COMMUNITY): Payer: Self-pay

## 2011-12-28 ENCOUNTER — Encounter (HOSPITAL_COMMUNITY): Payer: Self-pay | Admitting: Gastroenterology

## 2011-12-28 LAB — CREATININE, SERUM
Creatinine, Ser: 0.58 mg/dL (ref 0.50–1.10)
GFR calc non Af Amer: >90 mL/min (ref >90)

## 2011-12-28 MED ORDER — KCL IN DEXTROSE-NACL 20-5-0.9 MEQ/L-%-% IV SOLN
INTRAVENOUS | Status: DC
  Administered 2011-12-30: 15:00:00 via INTRAVENOUS
  Administered 2011-12-31: 50 mL/h via INTRAVENOUS
  Administered 2012-01-01: 125 mL/h via INTRAVENOUS
  Administered 2012-01-01 – 2012-01-07 (×5): via INTRAVENOUS
  Filled 2011-12-28 (×16): qty 1000

## 2011-12-28 NOTE — Progress Notes (Signed)
Patient ID: Renee Nicholson, female   DOB: Nov 26, 1965, 46 y.o.   MRN: [id number] [facility] Gastroenterology Progress Note  Paola U Rippeon 45 y.o. [birth year]   Subjective: Smiling and states that she is able to swallow liquids a lot better than before. Burning has decreased. Tolerating yogurt. Sitting in chair  Objective: Vital signs: Filed Vitals:   12/28/11 0430  BP: 115/74  Pulse: 87  Temp: 98.2 F (36.8 C)  Resp: 18    Intake/Output last 24 hrs:  Intake/Output Summary (Last 24 hours) at 12/28/11 1147 Last data filed at 12/28/11 1100  Gross per 24 hour  Intake      0 ml  Output    600 ml  Net   -600 ml    Physical Exam: Gen: alert, no acute distress, thin  Lab Results:  Basename 12/28/11 0413 12/26/11 0348  NA -- 131*  K -- 4.7  CL -- 96  CO2 -- 25  GLUCOSE -- 120*  BUN -- 5*  CREATININE 0.58 0.46*  CALCIUM -- 8.8  MG -- --  PHOS -- --    Basename 12/26/11 0348  AST 39*  ALT 51*  ALKPHOS 77  BILITOT 0.4  PROT 5.6*  ALBUMIN 2.5*    Basename 12/26/11 0348  WBC 9.4  NEUTROABS --  HGB 11.4*  HCT 34.1*  MCV 83.6  PLT 391     Medications: I have reviewed the patient's current medications.  Assessment/Plan: 45yo with T3N1 rectal cancer s/p IPAA on 12/14/11 who has been having severe odynophagia/dysphagia/heartburn postop and EGD yesterday showed erosive esophagitis. Cytology of esophagitis per Dr. Dierdre Searles showed acute inflammation without fungi or herpes. Will d/c Diflucan but continue Nystatin for another day and then stop (in case sampling error for yeast). Continue Carafate and IV PPI Q 12hours. Advised to avoid all solid food today but continue full liquids and clear liquids. If swallowing still improving, then can start soft foods on 12/29/11. Dr. Matthias Hughs available to see tomorrow.  Krysteena Stalker C. 12/28/2011, 11:47 AM

## 2011-12-28 NOTE — Progress Notes (Signed)
Patient ID: Renee Nicholson, female   DOB: [birth year], 46 y.o.   MRN: [id number] [facility] Surgery Progress Note:     Subjective: Slight improvement in dysphagia.  Objective: Vital signs in last 24 hours: Temp:  [98.1 F (36.7 C)-99.3 F (37.4 C)] 98.2 F (36.8 C) (04/04 0430) Pulse Rate:  [87-99] 87  (04/04 0430) Resp:  [18-20] 18  (04/04 0430) BP: (107-115)/(70-74) 115/74 mmHg (04/04 0430) SpO2:  [96 %-98 %] 98 % (04/04 0430)  Intake/Output from previous day: 04/03 0701 - 04/04 0700 In: -  Out: 500 [Stool:500] Intake/Output this shift: Total I/O In: -  Out: 200 [Stool:200]  Physical Exam: Alert, oriented. Abdomen non tender.  Ostomy output remains less watery.    Lab Results:  Results for orders placed during the hospital encounter of 12/14/11 (from the past 48 hour(s))  CREATININE, SERUM     Status: Normal   Collection Time   12/28/11  4:13 AM      Component Value Range Comment   Creatinine, Ser 0.58  0.50 - 1.10 (mg/dL)    GFR calc non Af Amer >90  >90 (mL/min)    GFR calc Af Amer >90  >90 (mL/min)     Radiology/Results: No results found.  Anti-infectives: Anti-infectives     Start     Dose/Rate Route Frequency Ordered Stop   12/26/11 0800   fluconazole (DIFLUCAN) IVPB 100 mg  Status:  Discontinued        100 mg 50 mL/hr over 60 Minutes Intravenous Every 24 hours 12/25/11 1408 12/28/11 1204   12/25/11 1500   fluconazole (DIFLUCAN) IVPB 200 mg        200 mg 100 mL/hr over 60 Minutes Intravenous  Once 12/25/11 1408 12/25/11 1628   12/18/11 1830   piperacillin-tazobactam (ZOSYN) IVPB 3.375 g  Status:  Discontinued        3.375 g 12.5 mL/hr over 240 Minutes Intravenous 3 times per day 12/18/11 1744 12/23/11 0840   12/15/11 0800   ertapenem (INVANZ) 1 g in sodium chloride 0.9 % 50 mL IVPB        1 g 100 mL/hr over 30 Minutes Intravenous Every 24 hours 12/14/11 1537 12/15/11 0812   12/14/11 0527   ertapenem (INVANZ) 1 g in sodium chloride 0.9 % 50 mL  IVPB        1 g 100 mL/hr over 30 Minutes Intravenous 60 min pre-op 12/14/11 0527 12/14/11 0805          Assessment/Plan: Problem List: Patient Active Problem List  Diagnoses  . Abdominal pain  . 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 Thyroid mass   IV diflucan/nystatin S&S for candidal esophagitis Await improvement of dysphagia for pt's PO intake to improve. Soft diet.   Once dysphagia improves, and diet picks up, will likely be able to go home.  Will need to make sure ostomy output does not dramatically pick up with diet.   Calorie count. Hope for home Monday if pain/dysphagia continues to improve and po intake improves.    12/28/2011 12:35 PM

## 2011-12-29 LAB — BASIC METABOLIC PANEL
Calcium: 8.9 mg/dL (ref 8.4–10.5)
Creatinine, Ser: 0.52 mg/dL (ref 0.50–1.10)
GFR calc non Af Amer: >90 mL/min (ref >90)
Glucose, Bld: 127 mg/dL — ABNORMAL HIGH (ref 70–99)
Sodium: 131 mEq/L — ABNORMAL LOW (ref 135–145)

## 2011-12-29 LAB — PREALBUMIN: Prealbumin: 20 mg/dL (ref 17.0–34.0)

## 2011-12-29 LAB — ALBUMIN: Albumin: 2.5 g/dL — ABNORMAL LOW (ref 3.5–5.2)

## 2011-12-29 LAB — CBC
MCH: 27.9 pg (ref 26.0–34.0)
MCHC: 33.1 g/dL (ref 30.0–36.0)
Platelets: 390 10*3/uL (ref 150–400)

## 2011-12-29 MED ORDER — FLUCONAZOLE 100MG IVPB
100.0000 mg | INTRAVENOUS | Status: AC
  Administered 2011-12-29 – 2011-12-31 (×3): 100 mg via INTRAVENOUS
  Filled 2011-12-29 (×3): qty 50

## 2011-12-29 MED ORDER — ENSURE COMPLETE PO LIQD
237.0000 mL | Freq: Three times a day (TID) | ORAL | Status: DC
  Administered 2011-12-29 – 2012-01-09 (×13): 237 mL via ORAL
  Filled 2011-12-29: qty 237

## 2011-12-29 MED ORDER — BOOST / RESOURCE BREEZE PO LIQD
1.0000 | Freq: Two times a day (BID) | ORAL | Status: DC
  Administered 2011-12-29 (×2): 1 via ORAL

## 2011-12-29 NOTE — Progress Notes (Signed)
Calorie Count Results  4/3 No meal ticket saved r/t insignificant intake, recorded 1 ice cream and 1 Resource Breeze documented as 100% consumed - 350 calories, 13g protein  4/4 Note left saying all pt consumed was 1 ice cream and 1 Ensure Complete documented as 100% consumed - 450 calories, 17g protein  4/5 Breakfast - 10% - 34 calories  Based on daily average of 4/3 and 4/4 which are more complete, estimated intake of 400 calories, 15g protein which meets 27% of calorie needs, 20% protein needs. Family and nursing report pt takes a few sips of nutritional supplements at a time and drinks throughout the day. Family reports pt still c/o burning with swallowing but it's getting better. Per nursing, ileostomy total output today greenish color.   RD to continue to monitor intake.   Dietitian # [phone]

## 2011-12-29 NOTE — Progress Notes (Signed)
Patient ID: Renee Nicholson, female   DOB: 03-Mar-1966, 46 y.o.   MRN: [id number] [facility] Progress Note:     Subjective: Dysphagia improved.  Complains that she is hungry.   Objective: Vital signs in last 24 hours: Temp:  [97.4 F (36.3 C)-98.3 F (36.8 C)] 97.4 F (36.3 C) (04/05 0543) Pulse Rate:  [87-90] 87  (04/05 0543) Resp:  [18] 18  (04/05 0543) BP: (104-106)/(69-73) 105/69 mmHg (04/05 0543) SpO2:  [96 %-99 %] 96 % (04/05 0543)  Intake/Output from previous day: 04/04 0701 - 04/05 0700 In: -  Out: 850 [Stool:850] Intake/Output this shift:    Physical Exam: Alert, oriented. Abdomen non tender.  Ostomy output remains less watery.   Drain site [employer].    Lab Results:  Results for orders placed during the hospital encounter of 12/14/11 (from the past 48 hour(s))  H. PYLORI ANTIBODY, IGG     Status: Normal   Collection Time   12/27/11  9:57 AM      Component Value Range Comment   H Pylori IgG 0.41     CREATININE, SERUM     Status: Normal   Collection Time   12/28/11  4:13 AM      Component Value Range Comment   Creatinine, Ser 0.58  0.50 - 1.10 (mg/dL)    GFR calc non Af Amer >90  >90 (mL/min)    GFR calc Af Amer >90  >90 (mL/min)   CBC     Status: Abnormal   Collection Time   12/29/11  4:20 AM      Component Value Range Comment   WBC 6.3  4.0 - 10.5 (K/uL)    RBC 3.87  3.87 - 5.11 (MIL/uL)    Hemoglobin 10.8 (*) 12.0 - 15.0 (g/dL)    HCT 40.9 (*) 81.1 - 46.0 (%)    MCV 84.2  78.0 - 100.0 (fL)    MCH 27.9  26.0 - 34.0 (pg)    MCHC 33.1  30.0 - 36.0 (g/dL)    RDW 91.4  78.2 - 95.6 (%)    Platelets 390  150 - 400 (K/uL)   BASIC METABOLIC PANEL     Status: Abnormal   Collection Time   12/29/11  4:20 AM      Component Value Range Comment   Sodium 131 (*) 135 - 145 (mEq/L)    Potassium 4.4  3.5 - 5.1 (mEq/L)    Chloride 97  96 - 112 (mEq/L)    CO2 25  19 - 32 (mEq/L)    Glucose, Bld 127 (*) 70 - 99 (mg/dL)    BUN 4 (*) 6 - 23 (mg/dL)    Creatinine, Ser  2.13  0.50 - 1.10 (mg/dL)    Calcium 8.9  8.4 - 10.5 (mg/dL)    GFR calc non Af Amer >90  >90 (mL/min)    GFR calc Af Amer >90  >90 (mL/min)   ALBUMIN     Status: Abnormal   Collection Time   12/29/11  4:20 AM      Component Value Range Comment   Albumin 2.5 (*) 3.5 - 5.2 (g/dL)     Radiology/Results: No results found.  Anti-infectives: Anti-infectives     Start     Dose/Rate Route Frequency Ordered Stop   12/29/11 0900   fluconazole (DIFLUCAN) IVPB 100 mg        100 mg 50 mL/hr over 60 Minutes Intravenous Every 24 hours 12/29/11 0803 01/01/12 0859   12/26/11 0800  fluconazole (DIFLUCAN) IVPB 100 mg  Status:  Discontinued        100 mg 50 mL/hr over 60 Minutes Intravenous Every 24 hours 12/25/11 1408 12/28/11 1204   12/25/11 1500   fluconazole (DIFLUCAN) IVPB 200 mg        200 mg 100 mL/hr over 60 Minutes Intravenous  Once 12/25/11 1408 12/25/11 1628   12/18/11 1830   piperacillin-tazobactam (ZOSYN) IVPB 3.375 g  Status:  Discontinued        3.375 g 12.5 mL/hr over 240 Minutes Intravenous 3 times per day 12/18/11 1744 12/23/11 0840   12/15/11 0800   ertapenem (INVANZ) 1 g in sodium chloride 0.9 % 50 mL IVPB        1 g 100 mL/hr over 30 Minutes Intravenous Every 24 hours 12/14/11 1537 12/15/11 0812   12/14/11 0527   ertapenem (INVANZ) 1 g in sodium chloride 0.9 % 50 mL IVPB        1 g 100 mL/hr over 30 Minutes Intravenous 60 min pre-op 12/14/11 0527 12/14/11 0805          Assessment/Plan: Problem List: Patient Active Problem List  Diagnoses  . Abdominal pain  . 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 Thyroid mass   IV diflucan/nystatin S&S for presumed candidal esophagitis.   Biopsy negative, but pt had already been on antifungals. On maximal therapy with PPI and carafate.   OK to try solid food today. Increase nutritional supplements. Calorie count. Hope for home Monday if pain/dysphagia continues to  improve and po intake improves.    12/29/2011 8:05 AM

## 2011-12-29 NOTE — Progress Notes (Signed)
There has been progressive, but very gradual, improvement in patient's burning and dysphagia.  She is taking minimal amounts of Ensure--perhaps 1 bottle per day.  Dietitian's note reviewed, and correlates with what the family is telling me.  The patient is also able to get down a few swallows of rice pudding.  H. Pylori serology neg (w/ respect to gastric ulcer).  At present, I would continue current mgt.   I anticipate the patient will improve soon enough, to the point where she can take adequate po's, that she will not need TNA.    We could consider going to a continuous Protonix infusion, but it's not compatible w/ her IV Diflucan, and I think it would probably offer only marginal improvement over the b.i.d. dosing, so I will plan to sit tight.  Endoscopic photos reviewed; I assume this is severe post-op reflux esophagitis rather than pill-induced or infectious (brushings negative).  Have advised pt's sister to have pt stay semi-upright rather than flat, while sleeping.  Will follow with you at a distance; call prn if more immediate input is needed.  Renee Nicholson, M.D. [phone]

## 2011-12-30 NOTE — Consult Note (Signed)
WOC ostomy consult  Stoma type/location: RLQ loop ileostomy Stomal assessment/size: 1 and 1/2 inches round loop ileostomy.  Proximal limb is located at top of stoma, near 1 o'clock Peristomal assessment: intact, erythema at 3-5 o'clock. Treatment options for stomal/peristomal skin: None indicated except for changing pouch Output thickening effluent.  Light brown. Ostomy pouching: 1pc. Convex pouch.  Three additional at bedside. Education provided: Patient observed pouch change, assisting with tape removal. Plan to change pouch Monday or Tuesday; we will continue to follow with you. Thanks, Ladona Mow, MSN, RN, [facility], CWOCN [phone])

## 2011-12-30 NOTE — Progress Notes (Signed)
CM spoke with pt with pt's sister at bedside concerning discharge planning. Per pt [facility] to provide [facility] for ostomy care upon discharge. CM contacted [facility] rep Talmadge Coventry to confirm new pt referral. No DME needed. Pt and sister state Rn have begun ostomy teaching, both state comfortable with self-care.   Leonie Green [phone]

## 2011-12-30 NOTE — Progress Notes (Signed)
Patient ID: Renee Nicholson, female   DOB: 1966/05/20, 46 y.o.   MRN: [id number] 3 Days Post-Op  Subjective: Feels gradually better. Drinking a little more Ensure and eating some rice and yogurt  Objective: Vital signs in last 24 hours: Temp:  [97.8 F (36.6 C)-98.7 F (37.1 C)] 98.7 F (37.1 C) (04/06 0618) Pulse Rate:  [87-101] 99  (04/06 0618) Resp:  [16-18] 16  (04/06 0618) BP: (93-137)/(62-78) 126/78 mmHg (04/06 0618) SpO2:  [95 %-97 %] 97 % (04/06 0618) Last BM Date: 12/29/11  Intake/Output from previous day: 04/05 0701 - 04/06 0700 In: -  Out: 600 [Stool:600] Intake/Output this shift:    General appearance: alert and no distress GI: normal findings: soft, non-tender  Lab Results:   [facility] 12/29/11 0420  WBC 6.3  HGB 10.8*  HCT 32.6*  PLT 390   BMET  Basename 12/29/11 0420 12/28/11 0413  NA 131* --  K 4.4 --  CL 97 --  CO2 25 --  GLUCOSE 127* --  BUN 4* --  CREATININE 0.52 0.58  CALCIUM 8.9 --     Studies/Results: No results found.  Anti-infectives: Anti-infectives     Start     Dose/Rate Route Frequency Ordered Stop   12/29/11 0900   fluconazole (DIFLUCAN) IVPB 100 mg        100 mg 50 mL/hr over 60 Minutes Intravenous Every 24 hours 12/29/11 0803 01/01/12 0859   12/26/11 0800   fluconazole (DIFLUCAN) IVPB 100 mg  Status:  Discontinued        100 mg 50 mL/hr over 60 Minutes Intravenous Every 24 hours 12/25/11 1408 12/28/11 1204   12/25/11 1500   fluconazole (DIFLUCAN) IVPB 200 mg        200 mg 100 mL/hr over 60 Minutes Intravenous  Once 12/25/11 1408 12/25/11 1628   12/18/11 1830   piperacillin-tazobactam (ZOSYN) IVPB 3.375 g  Status:  Discontinued        3.375 g 12.5 mL/hr over 240 Minutes Intravenous 3 times per day 12/18/11 1744 12/23/11 0840   12/15/11 0800   ertapenem (INVANZ) 1 g in sodium chloride 0.9 % 50 mL IVPB        1 g 100 mL/hr over 30 Minutes Intravenous Every 24 hours 12/14/11 1537 12/15/11 0812   12/14/11 0527    ertapenem (INVANZ) 1 g in sodium chloride 0.9 % 50 mL IVPB        1 g 100 mL/hr over 30 Minutes Intravenous 60 min pre-op 12/14/11 0527 12/14/11 0805          Assessment/Plan: s/p Procedure(s): ESOPHAGOGASTRODUODENOSCOPY (EGD) Severe reflux esophagitis, gradually improving Continue current Rx   LOS: 16 days    [name] T 12/30/2011

## 2011-12-31 NOTE — Progress Notes (Signed)
Pt's back pain has lessened. She now rates as a 4/10. Heat via the K-pack and/or showers seems to help considerably.  Still not eating. Hopefully she will do a little better with her dinner.  Barth Kirks 9:16 PM

## 2011-12-31 NOTE — Progress Notes (Signed)
  Patient ID: Renee Nicholson, female   DOB: Feb 17, 1966, 46 y.o.   MRN: [id number] 4 Days Post-Op  Subjective: Feels still better. Drinking a little more Ensure and eating some rice and yogurt  Objective: Vital signs in last 24 hours: Temp:  [97.4 F (36.3 C)-99.1 F (37.3 C)] 97.4 F (36.3 C) (04/07 0615) Pulse Rate:  [88-97] 88  (04/07 0615) Resp:  [16-18] 18  (04/07 0615) BP: (103-122)/(53-79) 122/79 mmHg (04/07 0615) SpO2:  [97 %] 97 % (04/07 0615) Last BM Date: 12/30/11 (300 ML OSTOMY)  Intake/Output from previous day: 04/06 0701 - 04/07 0700 In: 3823 [P.O.:720; I.V.:3103] Out: 650 [Stool:650] Intake/Output this shift:    General appearance: alert and no distress GI: normal findings: soft, non-tender  Lab Results:   [facility] 12/29/11 0420  WBC 6.3  HGB 10.8*  HCT 32.6*  PLT 390   BMET  Basename 12/29/11 0420  NA 131*  K 4.4  CL 97  CO2 25  GLUCOSE 127*  BUN 4*  CREATININE 0.52  CALCIUM 8.9     Studies/Results: No results found.  Anti-infectives: Anti-infectives     Start     Dose/Rate Route Frequency Ordered Stop   12/29/11 0900   fluconazole (DIFLUCAN) IVPB 100 mg        100 mg 50 mL/hr over 60 Minutes Intravenous Every 24 hours 12/29/11 0803 01/01/12 0859   12/26/11 0800   fluconazole (DIFLUCAN) IVPB 100 mg  Status:  Discontinued        100 mg 50 mL/hr over 60 Minutes Intravenous Every 24 hours 12/25/11 1408 12/28/11 1204   12/25/11 1500   fluconazole (DIFLUCAN) IVPB 200 mg        200 mg 100 mL/hr over 60 Minutes Intravenous  Once 12/25/11 1408 12/25/11 1628   12/18/11 1830   piperacillin-tazobactam (ZOSYN) IVPB 3.375 g  Status:  Discontinued        3.375 g 12.5 mL/hr over 240 Minutes Intravenous 3 times per day 12/18/11 1744 12/23/11 0840   12/15/11 0800   ertapenem (INVANZ) 1 g in sodium chloride 0.9 % 50 mL IVPB        1 g 100 mL/hr over 30 Minutes Intravenous Every 24 hours 12/14/11 1537 12/15/11 0812   12/14/11 0527   ertapenem  (INVANZ) 1 g in sodium chloride 0.9 % 50 mL IVPB        1 g 100 mL/hr over 30 Minutes Intravenous 60 min pre-op 12/14/11 0527 12/14/11 0805          Assessment/Plan: s/p Procedure(s): ESOPHAGOGASTRODUODENOSCOPY (EGD) Severe reflux esophagitis, gradually improving Continue current Rx   LOS: 17 days    Jimya Ciani T 12/31/2011

## 2011-12-31 NOTE — Progress Notes (Signed)
Patient not eating. Only eats 2-3 bites and then she feels full. Has very little energy. Also, she has lost 7 lbs. During her hospitalization. Now weights 89.4 lbs.  Additionally, pt. Complains of lower back pain, new onset. Rates pain at 6/10, constant. Barth Kirks 9:46 AM

## 2011-12-31 NOTE — Progress Notes (Signed)
GASTROENTEROLOGY PROGRESS NOTE  Problem:   Distal erosive esophagitis, probably due to reflux. 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 Is able to tolerate soft foods and liquids without difficulty. However, oral food intake remains poor due to anorexia. She is on a dysphagia 3 diet.  Objective: No recent weight. Weight 4 days ago was unchanged from preop weight. Albumin 2 days ago was 2.5, yet interestingly prealbumin was normal at 20. Esophageal brushings were negative for evidence of viral or fungal infections.  Assessment: Resolution of odynophagia, but continued poor oral intake related to anorexia  Plan: Check daily weights. Update albumin and prealbumin levels. At this time, in view of resolution of odynophagia, I do not feel that repeat endoscopy for confirmation of esophageal mucosal healing is necessary.  Renee Nicholson, M.D. 12/31/2011 4:03 PM

## 2012-01-01 LAB — COMPREHENSIVE METABOLIC PANEL
ALT: 38 U/L — ABNORMAL HIGH (ref 0–35)
Calcium: 9 mg/dL (ref 8.4–10.5)
GFR calc Af Amer: >90 mL/min (ref >90)
Glucose, Bld: 123 mg/dL — ABNORMAL HIGH (ref 70–99)
Sodium: 130 mEq/L — ABNORMAL LOW (ref 135–145)
Total Protein: 6.1 g/dL (ref 6.0–8.3)

## 2012-01-01 MED ORDER — HYDROCODONE-ACETAMINOPHEN 5-325 MG PO TABS: 0.5000 | ORAL_TABLET | Freq: Four times a day (QID) | ORAL | Status: DC | PRN

## 2012-01-01 MED ORDER — LOPERAMIDE HCL 2 MG PO CAPS
4.0000 mg | ORAL_CAPSULE | Freq: Four times a day (QID) | ORAL | Status: DC
  Administered 2012-01-01 – 2012-01-09 (×28): 4 mg via ORAL
  Filled 2012-01-01 (×2): qty 2
  Filled 2012-01-01: qty 1
  Filled 2012-01-01 (×30): qty 2
  Filled 2012-01-01: qty 1
  Filled 2012-01-01 (×7): qty 2

## 2012-01-01 MED ORDER — MEGESTROL ACETATE 400 MG/10ML PO SUSP
400.0000 mg | Freq: Two times a day (BID) | ORAL | Status: DC
  Administered 2012-01-01 – 2012-01-09 (×15): 400 mg via ORAL
  Filled 2012-01-01 (×18): qty 10

## 2012-01-01 NOTE — Progress Notes (Signed)
The patient states that her swallowing is better. She denies heartburn at this time. She is eating better although she is getting full fast.  Physical: She does not appear in any distress  Heart regular rhythm no murmurs  Lungs clear  Abdomen soft nontender  Impression: Esophagitis, it clinically seems that she is improving  Plan: Continue current plan of medical management

## 2012-01-01 NOTE — Progress Notes (Signed)
Patient ID: Renee Nicholson, female   DOB: 1965/12/24, 46 y.o.   MRN: [id number] 18 Days Post-Op  Subjective: Pt's odynophagia improved, but pt continues to get full easily.  She is able to only eat 5-10 bites/meal and has trouble finishing ensure in a few hours.  She was unhooked from her IV for 6 hours yesterday and felt very dry mouth and increased fatigue. She continues to walk in the hall.    Objective: Vital signs in last 24 hours: Temp:  [99.4 F (37.4 C)] 99.4 F (37.4 C) (04/07 1300) Pulse Rate:  [96] 96  (04/07 1300) Resp:  [16] 16  (04/07 1300) BP: (120)/(77) 120/77 mmHg (04/07 1300) SpO2:  [94 %] 94 % (04/07 1300) Weight:  [88 lb 11.2 oz (40.234 kg)-89 lb 6.4 oz (40.552 kg)] 88 lb 11.2 oz (40.234 kg) (04/08 0808) Last BM Date: 12/31/11  Intake/Output from previous day: 04/07 0701 - 04/08 0700 In: 1824 [P.O.:1080; I.V.:594; IV Piggyback:150] Out: 600 [Stool:600] Intake/Output this shift:    General appearance: alert, looks very weak. GI: normal findings: soft, non-tender Ostomy output remains liquidy.  Lab Results:  No results found for this basename: WBC:2,HGB:2,HCT:2,PLT:2 in the last 72 hours BMET  Basename 01/01/12 0400  NA 130*  K 4.2  CL 96  CO2 25  GLUCOSE 123*  BUN 3*  CREATININE 0.47*  CALCIUM 9.0     Studies/Results: No results found.  Anti-infectives: Anti-infectives     Start     Dose/Rate Route Frequency Ordered Stop   12/29/11 0900   fluconazole (DIFLUCAN) IVPB 100 mg        100 mg 50 mL/hr over 60 Minutes Intravenous Every 24 hours 12/29/11 0803 12/31/11 1006   12/26/11 0800   fluconazole (DIFLUCAN) IVPB 100 mg  Status:  Discontinued        100 mg 50 mL/hr over 60 Minutes Intravenous Every 24 hours 12/25/11 1408 12/28/11 1204   12/25/11 1500   fluconazole (DIFLUCAN) IVPB 200 mg        200 mg 100 mL/hr over 60 Minutes Intravenous  Once 12/25/11 1408 12/25/11 1628   12/18/11 1830   piperacillin-tazobactam (ZOSYN) IVPB 3.375 g   Status:  Discontinued        3.375 g 12.5 mL/hr over 240 Minutes Intravenous 3 times per day 12/18/11 1744 12/23/11 0840   12/15/11 0800   ertapenem (INVANZ) 1 g in sodium chloride 0.9 % 50 mL IVPB        1 g 100 mL/hr over 30 Minutes Intravenous Every 24 hours 12/14/11 1537 12/15/11 0812   12/14/11 0527   ertapenem (INVANZ) 1 g in sodium chloride 0.9 % 50 mL IVPB        1 g 100 mL/hr over 30 Minutes Intravenous 60 min pre-op 12/14/11 0527 12/14/11 0805          Assessment/Plan: s/p Procedure(s): ESOPHAGOGASTRODUODENOSCOPY (EGD) Severe reflux esophagitis, gradually improving Continue current Rx Add megace for appetite stimulation Increase imodium Continue therapy for esophagitis Increase IVF for next 24 hours to see if this helps fatigue.   Will convert to regular diet to see if different foods stimulate appetite.   Continue protonix Will try to stop IVF tomorrow. If remains weak, will repeat CT Switch from percocet to vicodin for nausea.   LOS: 18 days    Renee Nicholson 01/01/2012

## 2012-01-01 NOTE — Progress Notes (Signed)
Pt. Had nausea and lower left flank pain this a.m. Was given a percocet for the flank pain but refused interventions to relieve the nausea. She is still not eating much, irrespective of her sister's prodding. Dr's orders now call for daily wts. As of today she is down to 88.7 pounds. Drs. have switched her to a regular diet, increased her immodium intake, and ordered labs for CBC and BMet. She is weakened, has little energy, and seems depressed. She has a lot of family support. Slept for a short part of the day. Renee Nicholson 8:23 PM

## 2012-01-02 ENCOUNTER — Encounter (INDEPENDENT_AMBULATORY_CARE_PROVIDER_SITE_OTHER): Payer: BC Managed Care – PPO | Admitting: General Surgery

## 2012-01-02 ENCOUNTER — Inpatient Hospital Stay (HOSPITAL_COMMUNITY): Payer: BC Managed Care – PPO

## 2012-01-02 LAB — URINALYSIS, ROUTINE W REFLEX MICROSCOPIC
Bilirubin Urine: NEGATIVE
Ketones, ur: NEGATIVE mg/dL
Nitrite: POSITIVE — AB
Protein, ur: 100 mg/dL — AB
Urobilinogen, UA: 0.2 mg/dL (ref 0.0–1.0)

## 2012-01-02 LAB — BASIC METABOLIC PANEL
BUN: <3 mg/dL — ABNORMAL LOW (ref 6–23)
CO2: 25 mEq/L (ref 19–32)
Chloride: 98 mEq/L (ref 96–112)
Creatinine, Ser: 0.41 mg/dL — ABNORMAL LOW (ref 0.50–1.10)
GFR calc Af Amer: >90 mL/min (ref >90)
GFR calc non Af Amer: >90 mL/min (ref >90)
Glucose, Bld: 141 mg/dL — ABNORMAL HIGH (ref 70–99)
Potassium: 4.1 mEq/L (ref 3.5–5.1)
Sodium: 131 mEq/L — ABNORMAL LOW (ref 135–145)

## 2012-01-02 LAB — CBC
Hemoglobin: 9.6 g/dL — ABNORMAL LOW (ref 12.0–15.0)
MCH: 27.3 pg (ref 26.0–34.0)
RBC: 3.52 MIL/uL — ABNORMAL LOW (ref 3.87–5.11)

## 2012-01-02 MED ORDER — IOHEXOL 300 MG/ML  SOLN
100.0000 mL | Freq: Once | INTRAMUSCULAR | Status: AC | PRN
  Administered 2012-01-02: 100 mL via INTRAVENOUS

## 2012-01-02 NOTE — Progress Notes (Signed)
Patient ID: Renee Nicholson, female   DOB: 05/12/1966, 46 y.o.   MRN: [id number] 18 Days Post-Op  Subjective: Pt eating better, but now complaining of tachypnea and had temp 100.8 last night.    Objective: Vital signs in last 24 hours: Temp:  [98.1 F (36.7 C)-100.8 F (38.2 C)] 98.9 F (37.2 C) (04/09 0441) Pulse Rate:  [91-104] 104  (04/09 0441) Resp:  [16-20] 20  (04/09 0441) BP: (98-120)/(65-79) 100/65 mmHg (04/09 0441) SpO2:  [96 %-97 %] 97 % (04/09 0441) Last BM Date: 01/01/12  Intake/Output from previous day: 04/08 0701 - 04/09 0700 In: 3200.4 [P.O.:240; I.V.:2950.4; IV Piggyback:10] Out: 650 [Stool:650] Intake/Output this shift:    General appearance: alert, looks stronger today, but getting short of breath with conversation GI: normal findings: soft, non-tender Ostomy output remains liquidy.  Lab Results:   [facility] 01/02/12 0350  WBC 6.3  HGB 9.6*  HCT 29.2*  PLT 323   BMET  Basename 01/02/12 0350 01/01/12 0400  NA 131* 130*  K 4.1 4.2  CL 98 96  CO2 25 25  GLUCOSE 141* 123*  BUN <3* 3*  CREATININE 0.41* 0.47*  CALCIUM 8.8 9.0     Studies/Results: No results found.  Anti-infectives: Anti-infectives     Start     Dose/Rate Route Frequency Ordered Stop   12/29/11 0900   fluconazole (DIFLUCAN) IVPB 100 mg        100 mg 50 mL/hr over 60 Minutes Intravenous Every 24 hours 12/29/11 0803 12/31/11 1006   12/26/11 0800   fluconazole (DIFLUCAN) IVPB 100 mg  Status:  Discontinued        100 mg 50 mL/hr over 60 Minutes Intravenous Every 24 hours 12/25/11 1408 12/28/11 1204   12/25/11 1500   fluconazole (DIFLUCAN) IVPB 200 mg        200 mg 100 mL/hr over 60 Minutes Intravenous  Once 12/25/11 1408 12/25/11 1628   12/18/11 1830   piperacillin-tazobactam (ZOSYN) IVPB 3.375 g  Status:  Discontinued        3.375 g 12.5 mL/hr over 240 Minutes Intravenous 3 times per day 12/18/11 1744 12/23/11 0840   12/15/11 0800   ertapenem (INVANZ) 1 g in sodium  chloride 0.9 % 50 mL IVPB        1 g 100 mL/hr over 30 Minutes Intravenous Every 24 hours 12/14/11 1537 12/15/11 0812   12/14/11 0527   ertapenem (INVANZ) 1 g in sodium chloride 0.9 % 50 mL IVPB        1 g 100 mL/hr over 30 Minutes Intravenous 60 min pre-op 12/14/11 0527 12/14/11 0805          Assessment/Plan: s/p Procedure(s): ESOPHAGOGASTRODUODENOSCOPY (EGD) Severe reflux esophagitis, gradually improving Continue current Rx Megace for appetite stimulation Imodium at max Continue therapy for esophagitis Decrease IVF again. CT chest/abd/pelvis for tachypnea, chest pain, and temperature.  R/o PE and intraabdominal abscess    LOS: 19 days    [name] 01/02/2012

## 2012-01-02 NOTE — Plan of Care (Signed)
Problem: Inadequate Intake (NI-2.1) Goal: Food and/or nutrient delivery Individualized approach for food/nutrient provision.  Outcome: Progressing Intake gradually improving, diet advanced to regular

## 2012-01-02 NOTE — Progress Notes (Signed)
Nutrition Follow-up  Diet Order: Regular, Ensure Complete TID, Resource Breeze TID  - Severe reflux esophagitis gradually improving. Noted nursing student charted pt eating only 2-3 bites of meals then feels full earlier in the week. Noted pt has lost 10 pounds since admission. Noted pt with some nausea yesterday morning, pt denied any today. Pt started Megace yesterday and was changed to regular diet to see if different foods stimulate appetite. Pt reports she has been eating some rice today and tolerating regular diet and has consumed 1/2 of Ensure bottle so far today. Pt reports slight improvement in appetite.   CT of abdomen/pelvis today showed: 1. Persistent intra-abdominal air and left anterior abdominal wall subcutaneous air. This is greater than expected. The drain was removed 3 days ago and there is no enteric leak to account for persistent intra-abdominal free air.  2. 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  3. Loculated subphrenic left upper quadrant fluid is contiguous  with the intra-abdominal air. On coronal reconstructed images this shows peripheral enhancement and is suggestive of developing abscess. This does not clearly communicate with abscess over the right hepatic lobe. This does not clearly to be K with gas overlying the right hepatic lobe.   Meds: Scheduled Meds:   . enoxaparin  40 mg Subcutaneous Q24H  . feeding supplement  237 mL Oral TID BM  . feeding supplement  1 Container Oral TID WC  . loperamide  4 mg Oral QID  . megestrol  400 mg Oral BID  . pantoprazole (PROTONIX) IV  40 mg Intravenous Q12H  . potassium chloride  40 mEq Oral BID  . sucralfate  1 g Oral TID WC & HS   Continuous Infusions:   . dextrose 5 % and 0.9 % NaCl with KCl 20 mEq/L 40 mL/hr at 01/02/12 0929   PRN Meds:.acetaminophen, alum & mag hydroxide-simeth, diphenhydrAMINE, diphenhydrAMINE, diphenoxylate-atropine, fentaNYL, gi cocktail,  HYDROcodone-acetaminophen, iohexol, midazolam, morphine injection, naloxone, ondansetron (ZOFRAN) IV, ondansetron (ZOFRAN) IV, ondansetron, sodium chloride  Labs:  CMP     Component Value Date/Time   NA 131* 01/02/2012 0350   K 4.1 01/02/2012 0350   CL 98 01/02/2012 0350   CO2 25 01/02/2012 0350   GLUCOSE 141* 01/02/2012 0350   BUN <3* 01/02/2012 0350   CREATININE 0.41* 01/02/2012 0350   CREATININE 0.70 07/27/2011 1306   CALCIUM 8.8 01/02/2012 0350   PROT 6.1 01/01/2012 0400   ALBUMIN 2.5* 01/01/2012 0400   AST 43* 01/01/2012 0400   ALT 38* 01/01/2012 0400   ALKPHOS 128* 01/01/2012 0400   BILITOT 0.4 01/01/2012 0400   GFRNONAA >90 01/02/2012 0350   GFRAA >90 01/02/2012 0350     Intake/Output Summary (Last 24 hours) at 01/02/12 1407 Last data filed at 01/02/12 1014  Gross per 24 hour  Intake 2355.42 ml  Output   1320 ml  Net 1035.42 ml  Ileostomy - total output so far today   Weight Status:  3/21 98 lb   4/8   88 lb   Re-estimated needs: No changes.  1610-9604 calories  73.6-88.36g protein   Nutrition Dx: Inadequate oral intake - ongoing   Goal:  1. PO intake >75% at meals and supplements - not met.  2. Diet advancement as tolerated to bland diet - not met, however diet advanced to regular diet, pt choosing mostly bland foods  Intervention: Encouraged gradual increased intake and continued nutrition supplement intake. Hopefully Megace will continue to improve appetite.  Monitor: Weights, labs, intake, esophagitis    Marshall Cork Pager #: [phone]

## 2012-01-03 ENCOUNTER — Inpatient Hospital Stay (HOSPITAL_COMMUNITY): Payer: BC Managed Care – PPO

## 2012-01-03 ENCOUNTER — Other Ambulatory Visit: Payer: BC Managed Care – PPO | Admitting: Lab

## 2012-01-03 ENCOUNTER — Telehealth: Payer: Self-pay | Admitting: *Deleted

## 2012-01-03 ENCOUNTER — Ambulatory Visit: Payer: BC Managed Care – PPO | Admitting: Oncology

## 2012-01-03 ENCOUNTER — Telehealth (INDEPENDENT_AMBULATORY_CARE_PROVIDER_SITE_OTHER): Payer: Self-pay

## 2012-01-03 LAB — CBC
Hemoglobin: 10.2 g/dL — ABNORMAL LOW (ref 12.0–15.0)
MCH: 27.3 pg (ref 26.0–34.0)
MCV: 83.2 fL (ref 78.0–100.0)
RBC: 3.74 MIL/uL — ABNORMAL LOW (ref 3.87–5.11)
WBC: 7.1 10*3/uL (ref 4.0–10.5)

## 2012-01-03 LAB — PROTIME-INR: Prothrombin Time: 18.7 seconds — ABNORMAL HIGH (ref 11.6–15.2)

## 2012-01-03 LAB — APTT: aPTT: 42 seconds — ABNORMAL HIGH (ref 24–37)

## 2012-01-03 MED ORDER — VANCOMYCIN HCL 500 MG IV SOLR
500.0000 mg | Freq: Two times a day (BID) | INTRAVENOUS | Status: DC
  Administered 2012-01-03 – 2012-01-06 (×8): 500 mg via INTRAVENOUS
  Filled 2012-01-03 (×11): qty 500

## 2012-01-03 MED ORDER — MIDAZOLAM HCL 5 MG/5ML IJ SOLN
INTRAMUSCULAR | Status: AC | PRN
  Administered 2012-01-03 (×2): 1 mg via INTRAVENOUS

## 2012-01-03 MED ORDER — PIPERACILLIN-TAZOBACTAM 3.375 G IVPB
3.3750 g | Freq: Three times a day (TID) | INTRAVENOUS | Status: DC
  Administered 2012-01-03 – 2012-01-07 (×11): 3.375 g via INTRAVENOUS
  Filled 2012-01-03 (×14): qty 50

## 2012-01-03 MED ORDER — FENTANYL CITRATE 0.05 MG/ML IJ SOLN
INTRAMUSCULAR | Status: AC | PRN
  Administered 2012-01-03 (×3): 50 ug via INTRAVENOUS

## 2012-01-03 NOTE — H&P (View-Only) (Signed)
Patient ID: Renee Nicholson, female   DOB: 12/24/1965, 45 y.o.   MRN: [id number]   Subjective: Pt still feeling weak.  Still with poor po intake.    Objective: Vital signs in last 24 hours: Temp:  [98.5 F (36.9 C)-99.8 F (37.7 C)] 98.5 F (36.9 C) (04/10 0524) Pulse Rate:  [100-113] 104  (04/10 0524) Resp:  [20-28] 20  (04/10 0524) BP: (111-126)/(74-83) 126/79 mmHg (04/10 0524) SpO2:  [96 %-99 %] 98 % (04/10 0524) Weight:  [87 lb 4.8 oz (39.6 kg)-88 lb 13.5 oz (40.3 kg)] 88 lb 13.5 oz (40.3 kg) (04/10 0524) Last BM Date: 01/01/12  Intake/Output from previous day: 04/09 0701 - 04/10 0700 In: -  Out: 2520 [Urine:1675; Stool:845] Intake/Output this shift:    General appearance: alert, looks stronger today GI: normal findings: soft, non-tender Ostomy output more solid.  Lab Results:   Basename 01/03/12 0353 01/02/12 0350  WBC 7.1 6.3  HGB 10.2* 9.6*  HCT 31.1* 29.2*  PLT 388 323   BMET  Basename 01/02/12 0350 01/01/12 0400  NA 131* 130*  K 4.1 4.2  CL 98 96  CO2 25 25  GLUCOSE 141* 123*  BUN <3* 3*  CREATININE 0.41* 0.47*  CALCIUM 8.8 9.0     Studies/Results: Ct Angio Chest W/cm &/or Wo Cm  01/02/2012  *RADIOLOGY REPORT*  Clinical Data:  Abscess.  Ileostomy.  Pulmonary embolism.  Short of breath.  Rectal cancer.  Colon resection.  Diarrhea.  CT ANGIOGRAPHY CHEST WITH CONTRAST  Technique:  Multidetector CT imaging of the chest was performed using the standard protocol during bolus administration of intravenous contrast.  Multiplanar CT image reconstructions including MIPs were obtained to evaluate the vascular anatomy.  Contrast: 100mL OMNIPAQUE IOHEXOL 300 MG/ML  SOLN  Comparison:  10/27/2011.  08/31/2011.  Findings:  Stable low attenuation right thyroid lobe lesion.  No axillary adenopathy.  Small right and small to moderate left pleural effusions are present with associated compressive atelectasis.  Grossly the heart appears within normal limits.  Technically  adequate study for evaluation of pulmonary embolus.  No aggressive osseous lesions are present.  Review of the MIP images confirms the above findings.  IMPRESSION: 1.  Technically adequate study without pulmonary embolus or acute aortic abnormality. 2.  Small right and moderate left pleural effusions with associated compressive atelectasis. 3.  Stable right thyroid lobe nodule.  CT ABDOMEN AND PELVIS WITH CONTRAST  Technique:  Multidetector CT imaging of the abdomen and pelvis was performed using the standard protocol following bolus administration of intravenous contrast.  Findings:  There is free air in the abdomen. Left upper quadrant fluid has decreased compared to the prior exam.  The free air has decreased compared to the prior exam however this has not yet resolved.  Interval removal of left abdominal drain.  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  This is a loop ileostomy, with continuation into an ileal pouch.  The patient is status post total colectomy with ileoanal anastomosis.  Fluid within the pouch has decreased compared to the prior exam of 12/18/2011.  There is no air in the anatomic pelvis.  Uterus appears within normal limits aside from engorged left pelvic veins which can be associated with pelvic congestion syndrome.  Normal renal enhancement.  The adrenal glands appear normal.  There is no portal venous gas.  Tiny low density lesions in the liver are too small to characterize.  Pancreas grossly appears normal.  No calcified stones.  No biliary   ductal dilation.  Stranding is present in the expected location of the descending colon and in the mesenteric fat compatible with recent postoperative state.  The subcutaneous air in the left anterior abdominal wall is less than on the prior exam but still greater than expected.  Oral contrast is present in the stomach and small bowel and the ostomy in the right lower quadrant has contrast.  There is no extravasation of contrast into the  abdomen.  No abscess is present. No aggressive osseous lesions are identified.   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  IMPRESSION: 1.  Persistent intra-abdominal air and left anterior abdominal wall subcutaneous air.  This is greater than expected.  The drain was removed 3 days ago and there is no enteric leak to account for persistent intra-abdominal free air. 2.  Total colectomy with ileal pouch.  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 3. Loculated subphrenic left upper quadrant fluid is contiguous with the intra-abdominal air.  On coronal reconstructed images this shows peripheral enhancement and is suggestive of developing abscess.  This does not clearly communicate with abscess over the right hepatic lobe.  This does not clearly to be K with gas overlying the right hepatic lobe.  The case was discussed with Dr. Mariana Wiederholt at the time of dictation.  Original Report Authenticated By: GEOFFREY LAMKE, M.D.   Ct Abdomen Pelvis W Contrast  01/02/2012  *RADIOLOGY REPORT*  Clinical Data:  Abscess.  Ileostomy.  Pulmonary embolism.  Short of breath.  Rectal cancer.  Colon resection.  Diarrhea.  CT ANGIOGRAPHY CHEST WITH CONTRAST  Technique:  Multidetector CT imaging of the chest was performed using the standard protocol during bolus administration of intravenous contrast.  Multiplanar CT image reconstructions including MIPs were obtained to evaluate the vascular anatomy.  Contrast: 100mL OMNIPAQUE IOHEXOL 300 MG/ML  SOLN  Comparison:  10/27/2011.  08/31/2011.  Findings:  Stable low attenuation right thyroid lobe lesion.  No axillary adenopathy.  Small right and small to moderate left pleural effusions are present with associated compressive atelectasis.  Grossly the heart appears within normal limits.  Technically adequate study for evaluation of pulmonary embolus.  No aggressive osseous lesions are present.  Review of the MIP images confirms the above findings.   IMPRESSION: 1.  Technically adequate study without pulmonary embolus or acute aortic abnormality. 2.  Small right and moderate left pleural effusions with associated compressive atelectasis. 3.  Stable right thyroid lobe nodule.  CT ABDOMEN AND PELVIS WITH CONTRAST  Technique:  Multidetector CT imaging of the abdomen and pelvis was performed using the standard protocol following bolus administration of intravenous contrast.  Findings:  There is free air in the abdomen. Left upper quadrant fluid has decreased compared to the prior exam.  The free air has decreased compared to the prior exam however this has not yet resolved.  Interval removal of left abdominal drain.  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  This is a loop ileostomy, with continuation into an ileal pouch.  The patient is status post total colectomy with ileoanal anastomosis.  Fluid within the pouch has decreased compared to the prior exam of 12/18/2011.  There is no air in the anatomic pelvis.  Uterus appears within normal limits aside from engorged left pelvic veins which can be associated with pelvic congestion syndrome.  Normal renal enhancement.  The adrenal glands appear normal.  There is no portal venous gas.  Tiny low density lesions in   the liver are too small to characterize.  Pancreas grossly appears normal.  No calcified stones.  No biliary ductal dilation.  Stranding is present in the expected location of the descending colon and in the mesenteric fat compatible with recent postoperative state.  The subcutaneous air in the left anterior abdominal wall is less than on the prior exam but still greater than expected.  Oral contrast is present in the stomach and small bowel and the ostomy in the right lower quadrant has contrast.  There is no extravasation of contrast into the abdomen.  No abscess is present. No aggressive osseous lesions are identified.   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  IMPRESSION: 1.  Persistent intra-abdominal air and left anterior abdominal wall subcutaneous air.  This is greater than expected.  The drain was removed 3 days ago and there is no enteric leak to account for persistent intra-abdominal free air. 2.  Total colectomy with ileal pouch.  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 3. Loculated subphrenic left upper quadrant fluid is contiguous with the intra-abdominal air.  On coronal reconstructed images this shows peripheral enhancement and is suggestive of developing abscess.  This does not clearly communicate with abscess over the right hepatic lobe.  This does not clearly to be K with gas overlying the right hepatic lobe.  The case was discussed with Dr. Zafiro Routson at the time of dictation.  Original Report Authenticated By: GEOFFREY LAMKE, M.D.    Anti-infectives: Anti-infectives     Start     Dose/Rate Route Frequency Ordered Stop   01/03/12 0900   piperacillin-tazobactam (ZOSYN) IVPB 3.375 g        3.375 g 12.5 mL/hr over 240 Minutes Intravenous 3 times per day 01/03/12 0855     12/29/11 0900   fluconazole (DIFLUCAN) IVPB 100 mg        100 mg 50 mL/hr over 60 Minutes Intravenous Every 24 hours 12/29/11 0803 12/31/11 1006   12/26/11 0800   fluconazole (DIFLUCAN) IVPB 100 mg  Status:  Discontinued        100 mg 50 mL/hr over 60 Minutes Intravenous Every 24 hours 12/25/11 1408 12/28/11 1204   12/25/11 1500   fluconazole (DIFLUCAN) IVPB 200 mg        200 mg 100 mL/hr over 60 Minutes Intravenous  Once 12/25/11 1408 12/25/11 1628   12/18/11 1830   piperacillin-tazobactam (ZOSYN) IVPB 3.375 g  Status:  Discontinued        3.375 g 12.5 mL/hr over 240 Minutes Intravenous 3 times per day 12/18/11 1744 12/23/11 0840   12/15/11 0800   ertapenem (INVANZ) 1 g in sodium chloride 0.9 % 50 mL IVPB        1 g 100 mL/hr over 30 Minutes Intravenous Every 24 hours 12/14/11 1537 12/15/11 0812   12/14/11 0527   ertapenem (INVANZ) 1 g in  sodium chloride 0.9 % 50 mL IVPB        1 g 100 mL/hr over 30 Minutes Intravenous 60 min pre-op 12/14/11 0527 12/14/11 0805          Assessment/Plan: s/p Procedure(s): ESOPHAGOGASTRODUODENOSCOPY (EGD) Severe reflux esophagitis, gradually improving Continue current Rx Megace for appetite stimulation Imodium at max CT with continued air/small amount fluid in upper abdomen. Should be better over 2 weeks.   Will get perc drain.   Start antibiotics. Swallow study to rule out microperforation.      LOS: 20 days      Jaylin Roundy 01/03/2012    

## 2012-01-03 NOTE — Telephone Encounter (Signed)
Pt's husband called to cancel today appt with MD.  Per Mr. Guerin, pt " had her whole colon taken out and she has lost about 10lbs" Pt is in ICU-[facility]  Discussed with Mr. Pinckney message will be given to MD. Appt will be cancelled. Mr. Slattery verbalized understanding. Denied needing further assistance.  Onc Tx Sched sent

## 2012-01-03 NOTE — Progress Notes (Signed)
Patient ID: Renee Nicholson, female   DOB: [birth year], 46 y.o.   MRN: [id number]   Subjective: Pt still feeling weak.  Still with poor po intake.    Objective: Vital signs in last 24 hours: Temp:  [98.5 F (36.9 C)-99.8 F (37.7 C)] 98.5 F (36.9 C) (04/10 0524) Pulse Rate:  [100-113] 104  (04/10 0524) Resp:  [20-28] 20  (04/10 0524) BP: (111-126)/(74-83) 126/79 mmHg (04/10 0524) SpO2:  [96 %-99 %] 98 % (04/10 0524) Weight:  [87 lb 4.8 oz (39.6 kg)-88 lb 13.5 oz (40.3 kg)] 88 lb 13.5 oz (40.3 kg) (04/10 0524) Last BM Date: 01/01/12  Intake/Output from previous day: 04/09 0701 - 04/10 0700 In: -  Out: 2520 [Urine:1675; Stool:845] Intake/Output this shift:    General appearance: alert, looks stronger today GI: normal findings: soft, non-tender Ostomy output more solid.  Lab Results:   [facility] 01/03/12 0353 01/02/12 0350  WBC 7.1 6.3  HGB 10.2* 9.6*  HCT 31.1* 29.2*  PLT 388 323   BMET  Basename 01/02/12 0350 01/01/12 0400  NA 131* 130*  K 4.1 4.2  CL 98 96  CO2 25 25  GLUCOSE 141* 123*  BUN <3* 3*  CREATININE 0.41* 0.47*  CALCIUM 8.8 9.0     Studies/Results: Ct Angio Chest W/cm &/or Wo Cm  01/02/2012  *RADIOLOGY REPORT*  Clinical Data:  Abscess.  Ileostomy.  Pulmonary embolism.  Short of breath.  Rectal cancer.  Colon resection.  Diarrhea.  CT ANGIOGRAPHY CHEST WITH CONTRAST  Technique:  Multidetector CT imaging of the chest was performed using the standard protocol during bolus administration of intravenous contrast.  Multiplanar CT image reconstructions including MIPs were obtained to evaluate the vascular anatomy.  Contrast: OMNIPAQUE IOHEXOL 300 MG/ML  SOLN  Comparison:  10/27/2011.  08/31/2011.  Findings:  Stable low attenuation right thyroid lobe lesion.  No axillary adenopathy.  Small right and small to moderate left pleural effusions are present with associated compressive atelectasis.  Grossly the heart appears within normal limits.  Technically  adequate study for evaluation of pulmonary embolus.  No aggressive osseous lesions are present.  Review of the MIP images confirms the above findings.  IMPRESSION: 1.  Technically adequate study without pulmonary embolus or acute aortic abnormality. 2.  Small right and moderate left pleural effusions with associated compressive atelectasis. 3.  Stable right thyroid lobe nodule.  CT ABDOMEN AND PELVIS WITH CONTRAST  Technique:  Multidetector CT imaging of the abdomen and pelvis was performed using the standard protocol following bolus administration of intravenous contrast.  Findings:  There is free air in the abdomen. Left upper quadrant fluid has decreased compared to the prior exam.  The free air has decreased compared to the prior exam however this has not yet resolved.  Interval removal of left abdominal drain.  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  This is a loop ileostomy, with continuation into an ileal pouch.  The patient is status post total colectomy with ileoanal anastomosis.  Fluid within the pouch has decreased compared to the prior exam of 12/18/2011.  There is no air in the anatomic pelvis.  Uterus appears within normal limits aside from engorged left pelvic veins which can be associated with pelvic congestion syndrome.  Normal renal enhancement.  The adrenal glands appear normal.  There is no portal venous gas.  Tiny low density lesions in the liver are too small to characterize.  Pancreas grossly appears normal.  No calcified stones.  No biliary  ductal dilation.  Stranding is present in the expected location of the descending colon and in the mesenteric fat compatible with recent postoperative state.  The subcutaneous air in the left anterior abdominal wall is less than on the prior exam but still greater than expected.  Oral contrast is present in the stomach and small bowel and the ostomy in the right lower quadrant has contrast.  There is no extravasation of contrast into the  abdomen.  No abscess is present. No aggressive osseous lesions are identified.   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  IMPRESSION: 1.  Persistent intra-abdominal air and left anterior abdominal wall subcutaneous air.  This is greater than expected.  The drain was removed 3 days ago and there is no enteric leak to account for persistent intra-abdominal free air. 2.  Total colectomy with ileal pouch.  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 3. Loculated subphrenic left upper quadrant fluid is contiguous with the intra-abdominal air.  On coronal reconstructed images this shows peripheral enhancement and is suggestive of developing abscess.  This does not clearly communicate with abscess over the right hepatic lobe.  This does not clearly to be K with gas overlying the right hepatic lobe.  The case was discussed with Dr. Donell Beers at the time of dictation.  Original Report Authenticated By: Andreas Newport, M.D.   Ct Abdomen Pelvis W Contrast  01/02/2012  *RADIOLOGY REPORT*  Clinical Data:  Abscess.  Ileostomy.  Pulmonary embolism.  Short of breath.  Rectal cancer.  Colon resection.  Diarrhea.  CT ANGIOGRAPHY CHEST WITH CONTRAST  Technique:  Multidetector CT imaging of the chest was performed using the standard protocol during bolus administration of intravenous contrast.  Multiplanar CT image reconstructions including MIPs were obtained to evaluate the vascular anatomy.  Contrast: OMNIPAQUE IOHEXOL 300 MG/ML  SOLN  Comparison:  10/27/2011.  08/31/2011.  Findings:  Stable low attenuation right thyroid lobe lesion.  No axillary adenopathy.  Small right and small to moderate left pleural effusions are present with associated compressive atelectasis.  Grossly the heart appears within normal limits.  Technically adequate study for evaluation of pulmonary embolus.  No aggressive osseous lesions are present.  Review of the MIP images confirms the above findings.   IMPRESSION: 1.  Technically adequate study without pulmonary embolus or acute aortic abnormality. 2.  Small right and moderate left pleural effusions with associated compressive atelectasis. 3.  Stable right thyroid lobe nodule.  CT ABDOMEN AND PELVIS WITH CONTRAST  Technique:  Multidetector CT imaging of the abdomen and pelvis was performed using the standard protocol following bolus administration of intravenous contrast.  Findings:  There is free air in the abdomen. Left upper quadrant fluid has decreased compared to the prior exam.  The free air has decreased compared to the prior exam however this has not yet resolved.  Interval removal of left abdominal drain.  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  This is a loop ileostomy, with continuation into an ileal pouch.  The patient is status post total colectomy with ileoanal anastomosis.  Fluid within the pouch has decreased compared to the prior exam of 12/18/2011.  There is no air in the anatomic pelvis.  Uterus appears within normal limits aside from engorged left pelvic veins which can be associated with pelvic congestion syndrome.  Normal renal enhancement.  The adrenal glands appear normal.  There is no portal venous gas.  Tiny low density lesions in  the liver are too small to characterize.  Pancreas grossly appears normal.  No calcified stones.  No biliary ductal dilation.  Stranding is present in the expected location of the descending colon and in the mesenteric fat compatible with recent postoperative state.  The subcutaneous air in the left anterior abdominal wall is less than on the prior exam but still greater than expected.  Oral contrast is present in the stomach and small bowel and the ostomy in the right lower quadrant has contrast.  There is no extravasation of contrast into the abdomen.  No abscess is present. No aggressive osseous lesions are identified.   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  IMPRESSION: 1.  Persistent intra-abdominal air and left anterior abdominal wall subcutaneous air.  This is greater than expected.  The drain was removed 3 days ago and there is no enteric leak to account for persistent intra-abdominal free air. 2.  Total colectomy with ileal pouch.  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 3. Loculated subphrenic left upper quadrant fluid is contiguous with the intra-abdominal air.  On coronal reconstructed images this shows peripheral enhancement and is suggestive of developing abscess.  This does not clearly communicate with abscess over the right hepatic lobe.  This does not clearly to be K with gas overlying the right hepatic lobe.  The case was discussed with Dr. Donell Beers at the time of dictation.  Original Report Authenticated By: Andreas Newport, M.D.    Anti-infectives: Anti-infectives     Start     Dose/Rate Route Frequency Ordered Stop   01/03/12 0900   piperacillin-tazobactam (ZOSYN) IVPB 3.375 g        3.375 g 12.5 mL/hr over 240 Minutes Intravenous 3 times per day 01/03/12 0855     12/29/11 0900   fluconazole (DIFLUCAN) IVPB 100 mg        100 mg 50 mL/hr over 60 Minutes Intravenous Every 24 hours 12/29/11 0803 12/31/11 1006   12/26/11 0800   fluconazole (DIFLUCAN) IVPB 100 mg  Status:  Discontinued        100 mg 50 mL/hr over 60 Minutes Intravenous Every 24 hours 12/25/11 1408 12/28/11 1204   12/25/11 1500   fluconazole (DIFLUCAN) IVPB 200 mg        200 mg 100 mL/hr over 60 Minutes Intravenous  Once 12/25/11 1408 12/25/11 1628   12/18/11 1830   piperacillin-tazobactam (ZOSYN) IVPB 3.375 g  Status:  Discontinued        3.375 g 12.5 mL/hr over 240 Minutes Intravenous 3 times per day 12/18/11 1744 12/23/11 0840   12/15/11 0800   ertapenem (INVANZ) 1 g in sodium chloride 0.9 % 50 mL IVPB        1 g 100 mL/hr over 30 Minutes Intravenous Every 24 hours 12/14/11 1537 12/15/11 0812   12/14/11 0527   ertapenem (INVANZ) 1 g in  sodium chloride 0.9 % 50 mL IVPB        1 g 100 mL/hr over 30 Minutes Intravenous 60 min pre-op 12/14/11 0527 12/14/11 0805          Assessment/Plan: s/p Procedure(s): ESOPHAGOGASTRODUODENOSCOPY (EGD) Severe reflux esophagitis, gradually improving Continue current Rx Megace for appetite stimulation Imodium at max CT with continued air/small amount fluid in upper abdomen. Should be better over 2 weeks.   Will get perc drain.   Start antibiotics. Swallow study to rule out microperforation.      LOS: 20 days  [facility] 01/03/2012

## 2012-01-03 NOTE — Telephone Encounter (Signed)
Ok noted  

## 2012-01-03 NOTE — Procedures (Signed)
CT guided 47F pigtail into LUQ collection 9ml thin blood tinged fluid, for gs, c&s No complication No blood loss. See complete dictation in [facility].

## 2012-01-03 NOTE — Telephone Encounter (Signed)
patient is in ICU canceling the appointment

## 2012-01-03 NOTE — Interval H&P Note (Cosign Needed)
History and Physical Interval Note:  01/03/2012 9:42 AM  Renee Nicholson is scheduled for CT guided drainage of subphrenic air/fluid collection today.  The various methods of treatment have been discussed with the patient and family. After consideration of risks, benefits and other options for treatment, the patient has consented to the above procedure. .  The patients' history has been reviewed, imaging studies reviewed by Dr. Deanne Coffer, patient examined, no change in status, stable for the above procedure. Chest- decreased BS bases, Heart- tachycardic, reg rythym. I have reviewed the patients' chart and labs.  Questions were answered to the patient's satisfaction.   Past Medical History  Diagnosis Date  . Anemia   . Diarrhea   . Rectal cancer 08/16/2011  . Colon cancer   . Anxiety   . Bright red rectal bleeding 09/13/2011  . History of chemotherapy     completed 09/2011    Past Surgical History  Procedure Date  . Tubal ligation   . Carpal tunnel release   . Colonoscopy 07/26/2011    Procedure: COLONOSCOPY;  Surgeon: Arlyce Harman, MD;  Location: [facility];  Service: Endoscopy;  Laterality: N/A;  10:40  . Esophagogastroduodenoscopy 07/26/2011    Procedure: ESOPHAGOGASTRODUODENOSCOPY (EGD);  Surgeon: Arlyce Harman, MD;  Location: [facility];  Service: Endoscopy;  Laterality: N/A;  . Esophagogastroduodenoscopy 12/27/2011    Procedure: ESOPHAGOGASTRODUODENOSCOPY (EGD);  Surgeon: Shirley Friar, MD;  Location: [facility];  Service: Endoscopy;  Laterality: N/A;   Dg Chest 1 View  12/17/2011  *RADIOLOGY REPORT*  Clinical Data: Chest pain, postop colon surgery  PORTABLE CHEST - 1 VIEW  Comparison: Portable exam 0418 hours without priors for comparison.  Findings: Significant gas identified in the upper abdomen, suspect combination of marked gaseous distention of the stomach and less free intraperitoneal air. Gaseous distention of the esophagus is also identified. Elevation of the  diaphragms bilaterally with bibasilar atelectasis. Mild enlargement cardiac silhouette. Minimal peribronchial thickening. No pneumothorax. Bones demineralized. Percutaneous catheter/tube the left upper quadrant.  IMPRESSION: Probable marked gaseous distention of stomach and less free intraperitoneal air in upper abdomen. Gaseous distention of the esophagus. Decreased lung volumes with accentuation of heart size and bibasilar atelectasis. No definite pneumothorax.  Findings discussed with Dr. Abbey Chatters on 12/17/2011 at 0440 hours.  Original Report Authenticated By: Lollie Marrow, M.D.   Ct Angio Chest W/cm &/or Wo Cm  01/02/2012  *RADIOLOGY REPORT*  Clinical Data:  Abscess.  Ileostomy.  Pulmonary embolism.  Short of breath.  Rectal cancer.  Colon resection.  Diarrhea.  CT ANGIOGRAPHY CHEST WITH CONTRAST  Technique:  Multidetector CT imaging of the chest was performed using the standard protocol during bolus administration of intravenous contrast.  Multiplanar CT image reconstructions including MIPs were obtained to evaluate the vascular anatomy.  Contrast: OMNIPAQUE IOHEXOL 300 MG/ML  SOLN  Comparison:  10/27/2011.  08/31/2011.  Findings:  Stable low attenuation right thyroid lobe lesion.  No axillary adenopathy.  Small right and small to moderate left pleural effusions are present with associated compressive atelectasis.  Grossly the heart appears within normal limits.  Technically adequate study for evaluation of pulmonary embolus.  No aggressive osseous lesions are present.  Review of the MIP images confirms the above findings.  IMPRESSION: 1.  Technically adequate study without pulmonary embolus or acute aortic abnormality. 2.  Small right and moderate left pleural effusions with associated compressive atelectasis. 3.  Stable right thyroid lobe nodule.  CT ABDOMEN AND PELVIS WITH CONTRAST  Technique:  Multidetector CT imaging of the abdomen and pelvis was performed using the standard protocol following  bolus administration of intravenous contrast.  Findings:  There is free air in the abdomen. Left upper quadrant fluid has decreased compared to the prior exam.  The free air has decreased compared to the prior exam however this has not yet resolved.  Interval removal of left abdominal drain.  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  This is a loop ileostomy, with continuation into an ileal pouch.  The patient is status post total colectomy with ileoanal anastomosis.  Fluid within the pouch has decreased compared to the prior exam of 12/18/2011.  There is no air in the anatomic pelvis.  Uterus appears within normal limits aside from engorged left pelvic veins which can be associated with pelvic congestion syndrome.  Normal renal enhancement.  The adrenal glands appear normal.  There is no portal venous gas.  Tiny low density lesions in the liver are too small to characterize.  Pancreas grossly appears normal.  No calcified stones.  No biliary ductal dilation.  Stranding is present in the expected location of the descending colon and in the mesenteric fat compatible with recent postoperative state.  The subcutaneous air in the left anterior abdominal wall is less than on the prior exam but still greater than expected.  Oral contrast is present in the stomach and small bowel and the ostomy in the right lower quadrant has contrast.  There is no extravasation of contrast into the abdomen.  No abscess is present. No aggressive osseous lesions are identified.   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  IMPRESSION: 1.  Persistent intra-abdominal air and left anterior abdominal wall subcutaneous air.  This is greater than expected.  The drain was removed 3 days ago and there is no enteric leak to account for persistent intra-abdominal free air. 2.  Total colectomy with ileal pouch.  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 3. Loculated subphrenic left upper  quadrant fluid is contiguous with the intra-abdominal air.  On coronal reconstructed images this shows peripheral enhancement and is suggestive of developing abscess.  This does not clearly communicate with abscess over the right hepatic lobe.  This does not clearly to be K with gas overlying the right hepatic lobe.  The case was discussed with Dr. Donell Beers at the time of dictation.  Original Report Authenticated By: Andreas Newport, M.D.   Ct Abdomen Pelvis W Contrast  01/02/2012  *RADIOLOGY REPORT*  Clinical Data:  Abscess.  Ileostomy.  Pulmonary embolism.  Short of breath.  Rectal cancer.  Colon resection.  Diarrhea.  CT ANGIOGRAPHY CHEST WITH CONTRAST  Technique:  Multidetector CT imaging of the chest was performed using the standard protocol during bolus administration of intravenous contrast.  Multiplanar CT image reconstructions including MIPs were obtained to evaluate the vascular anatomy.  Contrast: OMNIPAQUE IOHEXOL 300 MG/ML  SOLN  Comparison:  10/27/2011.  08/31/2011.  Findings:  Stable low attenuation right thyroid lobe lesion.  No axillary adenopathy.  Small right and small to moderate left pleural effusions are present with associated compressive atelectasis.  Grossly the heart appears within normal limits.  Technically adequate study for evaluation of pulmonary embolus.  No aggressive osseous lesions are present.  Review of the MIP images confirms the above findings.  IMPRESSION: 1.  Technically adequate study without pulmonary embolus or acute aortic abnormality. 2.  Small right and moderate left pleural effusions with associated  compressive atelectasis. 3.  Stable right thyroid lobe nodule.  CT ABDOMEN AND PELVIS WITH CONTRAST  Technique:  Multidetector CT imaging of the abdomen and pelvis was performed using the standard protocol following bolus administration of intravenous contrast.  Findings:  There is free air in the abdomen. Left upper quadrant fluid has decreased compared to the prior  exam.  The free air has decreased compared to the prior exam however this has not yet resolved.  Interval removal of left abdominal drain.  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  This is a loop ileostomy, with continuation into an ileal pouch.  The patient is status post total colectomy with ileoanal anastomosis.  Fluid within the pouch has decreased compared to the prior exam of 12/18/2011.  There is no air in the anatomic pelvis.  Uterus appears within normal limits aside from engorged left pelvic veins which can be associated with pelvic congestion syndrome.  Normal renal enhancement.  The adrenal glands appear normal.  There is no portal venous gas.  Tiny low density lesions in the liver are too small to characterize.  Pancreas grossly appears normal.  No calcified stones.  No biliary ductal dilation.  Stranding is present in the expected location of the descending colon and in the mesenteric fat compatible with recent postoperative state.  The subcutaneous air in the left anterior abdominal wall is less than on the prior exam but still greater than expected.  Oral contrast is present in the stomach and small bowel and the ostomy in the right lower quadrant has contrast.  There is no extravasation of contrast into the abdomen.  No abscess is present. No aggressive osseous lesions are identified.   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  IMPRESSION: 1.  Persistent intra-abdominal air and left anterior abdominal wall subcutaneous air.  This is greater than expected.  The drain was removed 3 days ago and there is no enteric leak to account for persistent intra-abdominal free air. 2.  Total colectomy with ileal pouch.  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 3. Loculated subphrenic left upper quadrant fluid is contiguous with the intra-abdominal air.  On coronal reconstructed images this shows peripheral enhancement and is suggestive of developing  abscess.  This does not clearly communicate with abscess over the right hepatic lobe.  This does not clearly to be K with gas overlying the right hepatic lobe.  The case was discussed with Dr. Donell Beers at the time of dictation.  Original Report Authenticated By: Andreas Newport, M.D.   Ct Abdomen Pelvis W Contrast  12/18/2011  *RADIOLOGY REPORT*  Clinical Data: Evaluate for abscess. History of rectal cancer diagnosed in October 2012.  Chemotherapy and radiation therapy complete.  CT ABDOMEN AND PELVIS WITH CONTRAST  Technique:  Multidetector CT imaging of the abdomen and pelvis was performed following the standard protocol during bolus administration of intravenous contrast.  Contrast:  100 ml of Omnipaque-300.  Comparison: CT of chest, abdomen and pelvis dated 10/27/2011.  Findings:  Lung Bases: Moderate bilateral pleural effusions layering dependently.  There are associated areas of passive atelectasis in the lower lobes of the lungs bilaterally.  Abdomen/Pelvis:  There is a large volume of pneumoperitoneum, and gas tracking in the anterior abdominal wall (left greater than right).  The enhanced appearance of the liver, gallbladder, pancreas, spleen, bilateral adrenal glands and bilateral kidneys is unremarkable.  Mild dilatation of the second portion of the duodenum, with abrupt narrowing of  the caliber of the third portion of the duodenum as it traverses beneath the SMA (demonstrated on images 45 - 51 of series 2).  Borderline dilated loops of jejunum and ileum are also noted. Postoperative changes of total proctocolectomy and ileoanal anastomosis are noted.  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  A surgical drain is in place entering the left upper quadrant of the abdomen, extending down into the pelvis.  A large volume of ascites is noted, with the largest collection in the left upper quadrant of the abdomen.  Uterus and bilateral ovaries are unremarkable.  A Foley  balloon catheter is present within the lumen of the urinary bladder which is nearly completely decompressed, contains a small amount of gas in the nondependent portion (presumably iatrogenic).  Musculoskeletal: There are no aggressive appearing lytic or blastic lesions noted in the visualized portions of the skeleton.  IMPRESSION: 1.  Postoperative changes of total proctocolectomy with the ileoanal anastomosis and diverting right lower quadrant ileostomy, with the large volume of ascites and pneumoperitoneum, as above. Given that the patient is currently on postoperative day #4, these findings may not be entirely unexpected.  However, the volume of pneumoperitoneum is slightly greater than typically seen (this may represent residual insufflate from the laparoscopic procedure).  No definite abscess identified at this time. 2.  Diffuse dilatation of the small bowel, favored to represent a postoperative ileus.  There is a more focal mild dilatation of the duodenum with a transition point as the duodenum traverses beneath the SMA.  This may be because of mass effect from overlying organs, including the stomach which is rather distended. 3.  Moderate bilateral pleural effusions with associated areas of dependent atelectasis throughout the lower lobes of the lungs bilaterally.  Original Report Authenticated By: Florencia Reasons, M.D.   Dg Chest 1vsame Day  12/17/2011  *RADIOLOGY REPORT*  Clinical Data:  Hypoxia, gastric distention, abdominal pain, post colectomy, repeat exam following nasogastric tube placement with partial symptomatic improvement  CHEST - 1 VIEW SAME DAY  Comparison: Repeat portable exam 0519 hours compared to 0418 hours.  Findings: Nasogastric tube now present with in the stomach. Persistent gaseous distention of the upper abdomen is identified. This likely represents persistent gastric distention and significant free intraperitoneal air. Bibasilar pulmonary atelectasis again identified. No  pneumothorax.  IMPRESSION: Persistent significant gaseous distention of the upper abdomen, likely a combination of persistent gastric distention and significant free intraperitoneal air.  Findings discussed with Dr. Abbey Chatters on 12/17/2011 at 0545 hours.  Original Report Authenticated By: Lollie Marrow, M.D.  Results for orders placed during the hospital encounter of 12/14/11  TYPE AND SCREEN      Component Value Range   ABO/RH(D) B POS     Antibody Screen NEG     Sample Expiration 12/17/2011    ABO/RH      Component Value Range   ABO/RH(D) B POS    CBC      Component Value Range   WBC 10.3  4.0 - 10.5 (K/uL)   RBC 2.76 (*) 3.87 - 5.11 (MIL/uL)   Hemoglobin 8.1 (*) 12.0 - 15.0 (g/dL)   HCT 16.1 (*) 09.6 - 46.0 (%)   MCV 85.5  78.0 - 100.0 (fL)   MCH 29.3  26.0 - 34.0 (pg)   MCHC 34.3  30.0 - 36.0 (g/dL)   RDW 04.5 (*) 40.9 - 15.5 (%)   Platelets 129 (*) 150 - 400 (K/uL)  CREATININE,  SERUM      Component Value Range   Creatinine, Ser 0.52  0.50 - 1.10 (mg/dL)   GFR calc non Af Amer >90  >90 (mL/min)   GFR calc Af Amer >90  >90 (mL/min)  BASIC METABOLIC PANEL      Component Value Range   Sodium 133 (*) 135 - 145 (mEq/L)   Potassium 4.1  3.5 - 5.1 (mEq/L)   Chloride 98  96 - 112 (mEq/L)   CO2 28  19 - 32 (mEq/L)   Glucose, Bld 139 (*) 70 - 99 (mg/dL)   BUN 9  6 - 23 (mg/dL)   Creatinine, Ser 4.54  0.50 - 1.10 (mg/dL)   Calcium 8.3 (*) 8.4 - 10.5 (mg/dL)   GFR calc non Af Amer >90  >90 (mL/min)   GFR calc Af Amer >90  >90 (mL/min)  MAGNESIUM      Component Value Range   Magnesium 1.2 (*) 1.5 - 2.5 (mg/dL)  PHOSPHORUS      Component Value Range   Phosphorus 3.1  2.3 - 4.6 (mg/dL)  CBC      Component Value Range   WBC 11.3 (*) 4.0 - 10.5 (K/uL)   RBC 2.61 (*) 3.87 - 5.11 (MIL/uL)   Hemoglobin 7.5 (*) 12.0 - 15.0 (g/dL)   HCT 09.8 (*) 11.9 - 46.0 (%)   MCV 85.1  78.0 - 100.0 (fL)   MCH 28.7  26.0 - 34.0 (pg)   MCHC 33.8  30.0 - 36.0 (g/dL)   RDW 14.7  82.9 - 56.2 (%)    Platelets 150  150 - 400 (K/uL)  MAGNESIUM      Component Value Range   Magnesium 2.0  1.5 - 2.5 (mg/dL)  PHOSPHORUS      Component Value Range   Phosphorus 2.6  2.3 - 4.6 (mg/dL)  CBC      Component Value Range   WBC 10.1  4.0 - 10.5 (K/uL)   RBC 2.62 (*) 3.87 - 5.11 (MIL/uL)   Hemoglobin 7.7 (*) 12.0 - 15.0 (g/dL)   HCT 13.0 (*) 86.5 - 46.0 (%)   MCV 86.3  78.0 - 100.0 (fL)   MCH 29.4  26.0 - 34.0 (pg)   MCHC 34.1  30.0 - 36.0 (g/dL)   RDW 78.4 (*) 69.6 - 15.5 (%)   Platelets 180  150 - 400 (K/uL)  BASIC METABOLIC PANEL      Component Value Range   Sodium 128 (*) 135 - 145 (mEq/L)   Potassium 4.8  3.5 - 5.1 (mEq/L)   Chloride 96  96 - 112 (mEq/L)   CO2 25  19 - 32 (mEq/L)   Glucose, Bld 239 (*) 70 - 99 (mg/dL)   BUN 13  6 - 23 (mg/dL)   Creatinine, Ser 2.95  0.50 - 1.10 (mg/dL)   Calcium 8.3 (*) 8.4 - 10.5 (mg/dL)   GFR calc non Af Amer >90  >90 (mL/min)   GFR calc Af Amer >90  >90 (mL/min)  CBC      Component Value Range   WBC 4.2  4.0 - 10.5 (K/uL)   RBC 3.23 (*) 3.87 - 5.11 (MIL/uL)   Hemoglobin 9.3 (*) 12.0 - 15.0 (g/dL)   HCT 28.4 (*) 13.2 - 46.0 (%)   MCV 85.8  78.0 - 100.0 (fL)   MCH 28.8  26.0 - 34.0 (pg)   MCHC 33.6  30.0 - 36.0 (g/dL)   RDW 44.0 (*) 10.2 - 15.5 (%)   Platelets 272  150 - 400 (K/uL)  BASIC METABOLIC PANEL      Component Value Range   Sodium 122 (*) 135 - 145 (mEq/L)   Potassium 5.3 (*) 3.5 - 5.1 (mEq/L)   Chloride 94 (*) 96 - 112 (mEq/L)   CO2 19  19 - 32 (mEq/L)   Glucose, Bld 239 (*) 70 - 99 (mg/dL)   BUN 14  6 - 23 (mg/dL)   Creatinine, Ser 0.96  0.50 - 1.10 (mg/dL)   Calcium 8.4  8.4 - 04.5 (mg/dL)   GFR calc non Af Amer >90  >90 (mL/min)   GFR calc Af Amer >90  >90 (mL/min)  BASIC METABOLIC PANEL      Component Value Range   Sodium 126 (*) 135 - 145 (mEq/L)   Potassium 5.2 (*) 3.5 - 5.1 (mEq/L)   Chloride 96  96 - 112 (mEq/L)   CO2 22  19 - 32 (mEq/L)   Glucose, Bld 140 (*) 70 - 99 (mg/dL)   BUN 13  6 - 23 (mg/dL)    Creatinine, Ser 4.09  0.50 - 1.10 (mg/dL)   Calcium 8.0 (*) 8.4 - 10.5 (mg/dL)   GFR calc non Af Amer >90  >90 (mL/min)   GFR calc Af Amer >90  >90 (mL/min)  BASIC METABOLIC PANEL      Component Value Range   Sodium 129 (*) 135 - 145 (mEq/L)   Potassium 4.3  3.5 - 5.1 (mEq/L)   Chloride 100  96 - 112 (mEq/L)   CO2 22  19 - 32 (mEq/L)   Glucose, Bld 118 (*) 70 - 99 (mg/dL)   BUN 12  6 - 23 (mg/dL)   Creatinine, Ser 8.11  0.50 - 1.10 (mg/dL)   Calcium 7.6 (*) 8.4 - 10.5 (mg/dL)   GFR calc non Af Amer >90  >90 (mL/min)   GFR calc Af Amer >90  >90 (mL/min)  CBC      Component Value Range   WBC 6.4  4.0 - 10.5 (K/uL)   RBC 2.58 (*) 3.87 - 5.11 (MIL/uL)   Hemoglobin 7.4 (*) 12.0 - 15.0 (g/dL)   HCT 91.4 (*) 78.2 - 46.0 (%)   MCV 83.3  78.0 - 100.0 (fL)   MCH 28.7  26.0 - 34.0 (pg)   MCHC 34.4  30.0 - 36.0 (g/dL)   RDW 95.6 (*) 21.3 - 15.5 (%)   Platelets 260  150 - 400 (K/uL)  TYPE AND SCREEN      Component Value Range   ABO/RH(D) B POS     Antibody Screen NEG     Sample Expiration 12/21/2011     Unit Number [id number]     Blood Component Type RED CELLS,LR     Unit division 00     Status of Unit ISSUED,FINAL     Transfusion Status OK TO TRANSFUSE     Crossmatch Result Compatible     Unit Number [id number]     Blood Component Type RED CELLS,LR     Unit division 00     Status of Unit ISSUED,FINAL     Transfusion Status OK TO TRANSFUSE     Crossmatch Result Compatible    CBC      Component Value Range   WBC 10.4  4.0 - 10.5 (K/uL)   RBC 4.08  3.87 - 5.11 (MIL/uL)   Hemoglobin 11.5 (*) 12.0 - 15.0 (g/dL)   HCT 24.4 (*) 01.0 - 46.0 (%)   MCV 81.9  78.0 - 100.0 (fL)  MCH 28.2  26.0 - 34.0 (pg)   MCHC 34.4  30.0 - 36.0 (g/dL)   RDW 91.4  78.2 - 95.6 (%)   Platelets 226  150 - 400 (K/uL)  BASIC METABOLIC PANEL      Component Value Range   Sodium 135  135 - 145 (mEq/L)   Potassium 2.9 (*) 3.5 - 5.1 (mEq/L)   Chloride 99  96 - 112 (mEq/L)   CO2 25  19 - 32 (mEq/L)    Glucose, Bld 95  70 - 99 (mg/dL)   BUN 6  6 - 23 (mg/dL)   Creatinine, Ser 2.13 (*) 0.50 - 1.10 (mg/dL)   Calcium 7.8 (*) 8.4 - 10.5 (mg/dL)   GFR calc non Af Amer >90  >90 (mL/min)   GFR calc Af Amer >90  >90 (mL/min)  MAGNESIUM      Component Value Range   Magnesium 1.6  1.5 - 2.5 (mg/dL)  PHOSPHORUS      Component Value Range   Phosphorus 2.9  2.3 - 4.6 (mg/dL)  PREPARE RBC (CROSSMATCH)      Component Value Range   Order Confirmation ORDER PROCESSED BY BLOOD BANK    HEPATIC FUNCTION PANEL      Component Value Range   Total Protein 5.0 (*) 6.0 - 8.3 (g/dL)   Albumin 1.8 (*) 3.5 - 5.2 (g/dL)   AST 16  0 - 37 (U/L)   ALT 16  0 - 35 (U/L)   Alkaline Phosphatase 65  39 - 117 (U/L)   Total Bilirubin 0.8  0.3 - 1.2 (mg/dL)   Bilirubin, Direct 0.2  0.0 - 0.3 (mg/dL)   Indirect Bilirubin 0.6  0.3 - 0.9 (mg/dL)  COMPREHENSIVE METABOLIC PANEL      Component Value Range   Sodium 136  135 - 145 (mEq/L)   Potassium 3.6  3.5 - 5.1 (mEq/L)   Chloride 99  96 - 112 (mEq/L)   CO2 21  19 - 32 (mEq/L)   Glucose, Bld 107 (*) 70 - 99 (mg/dL)   BUN 10  6 - 23 (mg/dL)   Creatinine, Ser 0.86  0.50 - 1.10 (mg/dL)   Calcium 8.8  8.4 - 57.8 (mg/dL)   Total Protein 5.6 (*) 6.0 - 8.3 (g/dL)   Albumin 3.0 (*) 3.5 - 5.2 (g/dL)   AST 15  0 - 37 (U/L)   ALT 13  0 - 35 (U/L)   Alkaline Phosphatase 55  39 - 117 (U/L)   Total Bilirubin 1.5 (*) 0.3 - 1.2 (mg/dL)   GFR calc non Af Amer >90  >90 (mL/min)   GFR calc Af Amer >90  >90 (mL/min)  CBC      Component Value Range   WBC 11.0 (*) 4.0 - 10.5 (K/uL)   RBC 3.66 (*) 3.87 - 5.11 (MIL/uL)   Hemoglobin 10.3 (*) 12.0 - 15.0 (g/dL)   HCT 46.9 (*) 62.9 - 46.0 (%)   MCV 81.4  78.0 - 100.0 (fL)   MCH 28.1  26.0 - 34.0 (pg)   MCHC 34.6  30.0 - 36.0 (g/dL)   RDW 52.8  41.3 - 24.4 (%)   Platelets 248  150 - 400 (K/uL)  DIFFERENTIAL      Component Value Range   Neutrophils Relative 84 (*) 43 - 77 (%)   Lymphocytes Relative 6 (*) 12 - 46 (%)   Monocytes  Relative 8  3 - 12 (%)   Eosinophils Relative 1  0 - 5 (%)  Basophils Relative 1  0 - 1 (%)   Neutro Abs 9.2 (*) 1.7 - 7.7 (K/uL)   Lymphs Abs 0.7  0.7 - 4.0 (K/uL)   Monocytes Absolute 0.9  0.1 - 1.0 (K/uL)   Eosinophils Absolute 0.1  0.0 - 0.7 (K/uL)   Basophils Absolute 0.1  0.0 - 0.1 (K/uL)   RBC Morphology POLYCHROMASIA PRESENT     WBC Morphology MILD LEFT SHIFT (1-5% METAS, OCC MYELO, OCC BANDS)     Smear Review LARGE PLATELETS PRESENT    CBC      Component Value Range   WBC 8.1  4.0 - 10.5 (K/uL)   RBC 3.52 (*) 3.87 - 5.11 (MIL/uL)   Hemoglobin 10.1 (*) 12.0 - 15.0 (g/dL)   HCT 16.1 (*) 09.6 - 46.0 (%)   MCV 81.5  78.0 - 100.0 (fL)   MCH 28.7  26.0 - 34.0 (pg)   MCHC 35.2  30.0 - 36.0 (g/dL)   RDW 04.5  40.9 - 81.1 (%)   Platelets 267  150 - 400 (K/uL)  COMPREHENSIVE METABOLIC PANEL      Component Value Range   Sodium 132 (*) 135 - 145 (mEq/L)   Potassium 3.0 (*) 3.5 - 5.1 (mEq/L)   Chloride 97  96 - 112 (mEq/L)   CO2 23  19 - 32 (mEq/L)   Glucose, Bld 110 (*) 70 - 99 (mg/dL)   BUN 11  6 - 23 (mg/dL)   Creatinine, Ser 9.14  0.50 - 1.10 (mg/dL)   Calcium 8.2 (*) 8.4 - 10.5 (mg/dL)   Total Protein 5.1 (*) 6.0 - 8.3 (g/dL)   Albumin 2.6 (*) 3.5 - 5.2 (g/dL)   AST 18  0 - 37 (U/L)   ALT 15  0 - 35 (U/L)   Alkaline Phosphatase 63  39 - 117 (U/L)   Total Bilirubin 1.3 (*) 0.3 - 1.2 (mg/dL)   GFR calc non Af Amer >90  >90 (mL/min)   GFR calc Af Amer >90  >90 (mL/min)  BASIC METABOLIC PANEL      Component Value Range   Sodium 134 (*) 135 - 145 (mEq/L)   Potassium 3.8  3.5 - 5.1 (mEq/L)   Chloride 97  96 - 112 (mEq/L)   CO2 26  19 - 32 (mEq/L)   Glucose, Bld 116 (*) 70 - 99 (mg/dL)   BUN 5 (*) 6 - 23 (mg/dL)   Creatinine, Ser 7.82 (*) 0.50 - 1.10 (mg/dL)   Calcium 8.5  8.4 - 95.6 (mg/dL)   GFR calc non Af Amer >90  >90 (mL/min)   GFR calc Af Amer >90  >90 (mL/min)  CBC      Component Value Range   WBC 9.7  4.0 - 10.5 (K/uL)   RBC 4.06  3.87 - 5.11 (MIL/uL)    Hemoglobin 11.4 (*) 12.0 - 15.0 (g/dL)   HCT 21.3 (*) 08.6 - 46.0 (%)   MCV 81.8  78.0 - 100.0 (fL)   MCH 28.1  26.0 - 34.0 (pg)   MCHC 34.3  30.0 - 36.0 (g/dL)   RDW 57.8  46.9 - 62.9 (%)   Platelets 327  150 - 400 (K/uL)  COMPREHENSIVE METABOLIC PANEL      Component Value Range   Sodium 133 (*) 135 - 145 (mEq/L)   Potassium 4.7  3.5 - 5.1 (mEq/L)   Chloride 100  96 - 112 (mEq/L)   CO2 28  19 - 32 (mEq/L)   Glucose, Bld 127 (*) 70 -  99 (mg/dL)   BUN 4 (*) 6 - 23 (mg/dL)   Creatinine, Ser 1.61 (*) 0.50 - 1.10 (mg/dL)   Calcium 8.7  8.4 - 09.6 (mg/dL)   Total Protein 5.8 (*) 6.0 - 8.3 (g/dL)   Albumin 2.7 (*) 3.5 - 5.2 (g/dL)   AST 42 (*) 0 - 37 (U/L)   ALT 59 (*) 0 - 35 (U/L)   Alkaline Phosphatase 71  39 - 117 (U/L)   Total Bilirubin 0.6  0.3 - 1.2 (mg/dL)   GFR calc non Af Amer >90  >90 (mL/min)   GFR calc Af Amer >90  >90 (mL/min)  COMPREHENSIVE METABOLIC PANEL      Component Value Range   Sodium 131 (*) 135 - 145 (mEq/L)   Potassium 4.7  3.5 - 5.1 (mEq/L)   Chloride 96  96 - 112 (mEq/L)   CO2 25  19 - 32 (mEq/L)   Glucose, Bld 120 (*) 70 - 99 (mg/dL)   BUN 5 (*) 6 - 23 (mg/dL)   Creatinine, Ser 0.45 (*) 0.50 - 1.10 (mg/dL)   Calcium 8.8  8.4 - 40.9 (mg/dL)   Total Protein 5.6 (*) 6.0 - 8.3 (g/dL)   Albumin 2.5 (*) 3.5 - 5.2 (g/dL)   AST 39 (*) 0 - 37 (U/L)   ALT 51 (*) 0 - 35 (U/L)   Alkaline Phosphatase 77  39 - 117 (U/L)   Total Bilirubin 0.4  0.3 - 1.2 (mg/dL)   GFR calc non Af Amer >90  >90 (mL/min)   GFR calc Af Amer >90  >90 (mL/min)  CBC      Component Value Range   WBC 9.4  4.0 - 10.5 (K/uL)   RBC 4.08  3.87 - 5.11 (MIL/uL)   Hemoglobin 11.4 (*) 12.0 - 15.0 (g/dL)   HCT 81.1 (*) 91.4 - 46.0 (%)   MCV 83.6  78.0 - 100.0 (fL)   MCH 27.9  26.0 - 34.0 (pg)   MCHC 33.4  30.0 - 36.0 (g/dL)   RDW 78.2  95.6 - 21.3 (%)   Platelets 391  150 - 400 (K/uL)  H. PYLORI ANTIBODY, IGG      Component Value Range   H Pylori IgG 0.41    CREATININE, SERUM       Component Value Range   Creatinine, Ser 0.58  0.50 - 1.10 (mg/dL)   GFR calc non Af Amer >90  >90 (mL/min)   GFR calc Af Amer >90  >90 (mL/min)  CBC      Component Value Range   WBC 6.3  4.0 - 10.5 (K/uL)   RBC 3.87  3.87 - 5.11 (MIL/uL)   Hemoglobin 10.8 (*) 12.0 - 15.0 (g/dL)   HCT 08.6 (*) 57.8 - 46.0 (%)   MCV 84.2  78.0 - 100.0 (fL)   MCH 27.9  26.0 - 34.0 (pg)   MCHC 33.1  30.0 - 36.0 (g/dL)   RDW 46.9  62.9 - 52.8 (%)   Platelets 390  150 - 400 (K/uL)  BASIC METABOLIC PANEL      Component Value Range   Sodium 131 (*) 135 - 145 (mEq/L)   Potassium 4.4  3.5 - 5.1 (mEq/L)   Chloride 97  96 - 112 (mEq/L)   CO2 25  19 - 32 (mEq/L)   Glucose, Bld 127 (*) 70 - 99 (mg/dL)   BUN 4 (*) 6 - 23 (mg/dL)   Creatinine, Ser 4.13  0.50 - 1.10 (mg/dL)   Calcium 8.9  8.4 - 10.5 (mg/dL)   GFR calc non Af Amer >90  >90 (mL/min)   GFR calc Af Amer >90  >90 (mL/min)  ALBUMIN      Component Value Range   Albumin 2.5 (*) 3.5 - 5.2 (g/dL)  PREALBUMIN      Component Value Range   Prealbumin 20.0  17.0 - 34.0 (mg/dL)  COMPREHENSIVE METABOLIC PANEL      Component Value Range   Sodium 130 (*) 135 - 145 (mEq/L)   Potassium 4.2  3.5 - 5.1 (mEq/L)   Chloride 96  96 - 112 (mEq/L)   CO2 25  19 - 32 (mEq/L)   Glucose, Bld 123 (*) 70 - 99 (mg/dL)   BUN 3 (*) 6 - 23 (mg/dL)   Creatinine, Ser 1.91 (*) 0.50 - 1.10 (mg/dL)   Calcium 9.0  8.4 - 47.8 (mg/dL)   Total Protein 6.1  6.0 - 8.3 (g/dL)   Albumin 2.5 (*) 3.5 - 5.2 (g/dL)   AST 43 (*) 0 - 37 (U/L)   ALT 38 (*) 0 - 35 (U/L)   Alkaline Phosphatase 128 (*) 39 - 117 (U/L)   Total Bilirubin 0.4  0.3 - 1.2 (mg/dL)   GFR calc non Af Amer >90  >90 (mL/min)   GFR calc Af Amer >90  >90 (mL/min)  PREALBUMIN      Component Value Range   Prealbumin 18.2  17.0 - 34.0 (mg/dL)  CBC      Component Value Range   WBC 6.3  4.0 - 10.5 (K/uL)   RBC 3.52 (*) 3.87 - 5.11 (MIL/uL)   Hemoglobin 9.6 (*) 12.0 - 15.0 (g/dL)   HCT 29.5 (*) 62.1 - 46.0 (%)   MCV  83.0  78.0 - 100.0 (fL)   MCH 27.3  26.0 - 34.0 (pg)   MCHC 32.9  30.0 - 36.0 (g/dL)   RDW 30.8  65.7 - 84.6 (%)   Platelets 323  150 - 400 (K/uL)  BASIC METABOLIC PANEL      Component Value Range   Sodium 131 (*) 135 - 145 (mEq/L)   Potassium 4.1  3.5 - 5.1 (mEq/L)   Chloride 98  96 - 112 (mEq/L)   CO2 25  19 - 32 (mEq/L)   Glucose, Bld 141 (*) 70 - 99 (mg/dL)   BUN <3 (*) 6 - 23 (mg/dL)   Creatinine, Ser 9.62 (*) 0.50 - 1.10 (mg/dL)   Calcium 8.8  8.4 - 95.2 (mg/dL)   GFR calc non Af Amer >90  >90 (mL/min)   GFR calc Af Amer >90  >90 (mL/min)  URINALYSIS, ROUTINE W REFLEX MICROSCOPIC      Component Value Range   Color, Urine YELLOW  YELLOW    APPearance TURBID (*) CLEAR    Specific Gravity, Urine 1.006  1.005 - 1.030    pH 7.0  5.0 - 8.0    Glucose, UA NEGATIVE  NEGATIVE (mg/dL)   Hgb urine dipstick LARGE (*) NEGATIVE    Bilirubin Urine NEGATIVE  NEGATIVE    Ketones, ur NEGATIVE  NEGATIVE (mg/dL)   Protein, ur 841 (*) NEGATIVE (mg/dL)   Urobilinogen, UA 0.2  0.0 - 1.0 (mg/dL)   Nitrite POSITIVE (*) NEGATIVE    Leukocytes, UA LARGE (*) NEGATIVE   URINE MICROSCOPIC-ADD ON      Component Value Range   Squamous Epithelial / LPF FEW (*) RARE    WBC, UA TOO NUMEROUS TO COUNT  <3 (WBC/hpf)   RBC / HPF 11-20  <  3 (RBC/hpf)   Bacteria, UA MANY (*) RARE   CBC      Component Value Range   WBC 7.1  4.0 - 10.5 (K/uL)   RBC 3.74 (*) 3.87 - 5.11 (MIL/uL)   Hemoglobin 10.2 (*) 12.0 - 15.0 (g/dL)   HCT 28.4 (*) 13.2 - 46.0 (%)   MCV 83.2  78.0 - 100.0 (fL)   MCH 27.3  26.0 - 34.0 (pg)   MCHC 32.8  30.0 - 36.0 (g/dL)   RDW 44.0  10.2 - 72.5 (%)   Platelets 388  150 - 400 (K/uL)     Elianny Buxbaum,D KEVIN

## 2012-01-03 NOTE — Progress Notes (Signed)
ANTIBIOTIC CONSULT NOTE - INITIAL  Pharmacy Consult for Vancomycin Indication: suspected intra-abdominal infection  No Known Allergies  Patient Measurements: Height: 4\' 9"  (144.8 cm) Weight: 88 lb 13.5 oz (40.3 kg) IBW/kg (Calculated) : 38.6   Vital Signs: Temp: 98.5 F (36.9 C) (04/10 0524) Temp src: Axillary (04/10 0524) BP: 126/79 mmHg (04/10 0524) Pulse Rate: 104  (04/10 0524) Intake/Output from previous day: 04/09 0701 - 04/10 0700 In: -  Out: 2520 [Urine:1675; Stool:845] Intake/Output from this shift:    Labs:  Basename 01/03/12 0353 01/02/12 0350 01/01/12 0400  WBC 7.1 6.3 --  HGB 10.2* 9.6* --  PLT 388 323 --  LABCREA -- -- --  CREATININE -- 0.41* 0.47*   Estimated Creatinine Clearance: 53.5 ml/min (by C-G formula based on Cr of 0.41). No results found for this basename: VANCOTROUGH:2,VANCOPEAK:2,VANCORANDOM:2,GENTTROUGH:2,GENTPEAK:2,GENTRANDOM:2,TOBRATROUGH:2,TOBRAPEAK:2,TOBRARND:2,AMIKACINPEAK:2,AMIKACINTROU:2,AMIKACIN:2, in the last 72 hours   Microbiology: Recent Results (from the past 720 hour(s))  SURGICAL PCR SCREEN     Status: Normal   Collection Time   12/12/11  1:30 PM      Component Value Range Status Comment   MRSA, PCR NEGATIVE  NEGATIVE  Final    Staphylococcus aureus NEGATIVE  NEGATIVE  Final     Medical History: Past Medical History  Diagnosis Date  . Anemia   . Diarrhea   . Rectal cancer 08/16/2011  . Colon cancer   . Anxiety   . Bright red rectal bleeding 09/13/2011  . History of chemotherapy     completed 09/2011     Medications:  Anti-infectives     Start     Dose/Rate Route Frequency Ordered Stop   01/03/12 1000  piperacillin-tazobactam (ZOSYN) IVPB 3.375 g       3.375 g 12.5 mL/hr over 240 Minutes Intravenous Every 8 hours 01/03/12 0855     12/29/11 0900   fluconazole (DIFLUCAN) IVPB 100 mg        100 mg 50 mL/hr over 60 Minutes Intravenous Every 24 hours 12/29/11 0803 12/31/11 1006   12/26/11 0800   fluconazole  (DIFLUCAN) IVPB 100 mg  Status:  Discontinued        100 mg 50 mL/hr over 60 Minutes Intravenous Every 24 hours 12/25/11 1408 12/28/11 1204   12/25/11 1500   fluconazole (DIFLUCAN) IVPB 200 mg        200 mg 100 mL/hr over 60 Minutes Intravenous  Once 12/25/11 1408 12/25/11 1628   12/18/11 1830   piperacillin-tazobactam (ZOSYN) IVPB 3.375 g  Status:  Discontinued        3.375 g 12.5 mL/hr over 240 Minutes Intravenous 3 times per day 12/18/11 1744 12/23/11 0840   12/15/11 0800   ertapenem (INVANZ) 1 g in sodium chloride 0.9 % 50 mL IVPB        1 g 100 mL/hr over 30 Minutes Intravenous Every 24 hours 12/14/11 1537 12/15/11 0812   12/14/11 0527   ertapenem (INVANZ) 1 g in sodium chloride 0.9 % 50 mL IVPB        1 g 100 mL/hr over 30 Minutes Intravenous 60 min pre-op 12/14/11 0527 12/14/11 0805         Assessment:  46yo F had lap total colectomy, creation of an ileostomy on 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 Also odynophagia d/t severe reflux esophagitis. Fever and small air/fluid collection in upper abdomen. Planning for perc drain and starting empiric Zosyn + Vanc for possible intra-abdominal infection.  Normal serum creatinine, low weight. CrCl likely in the 60s at  least.  Goal of Therapy:  Vancomycin trough level 15-20 mcg/ml  Plan:  Agree with Zosyn 3.375g IV Q8H infused over 4hrs. Vancomycin 500mg  IV q12h. Measure Vanc trough at steady state. Follow up renal fxn and culture results.  Reece Packer 01/03/2012,9:08 AM

## 2012-01-03 NOTE — Telephone Encounter (Signed)
Dr. Donell Beers paged to call [facility] @ [facility] [phone]

## 2012-01-04 ENCOUNTER — Inpatient Hospital Stay (HOSPITAL_COMMUNITY): Payer: BC Managed Care – PPO

## 2012-01-04 MED ORDER — IOHEXOL 300 MG/ML  SOLN
105.0000 mL | Freq: Once | INTRAMUSCULAR | Status: AC | PRN
  Administered 2012-01-04: 105 mL via ORAL

## 2012-01-04 NOTE — Progress Notes (Signed)
8 Days Post-Op  Subjective: Pt ok. Awaiting swallow today. Mild pain at drain site.  Objective: Vital signs in last 24 hours: Temp:  [98 F (36.7 C)-98.9 F (37.2 C)] 98.1 F (36.7 C) (04/11 0435) Pulse Rate:  [90-111] 90  (04/11 0435) Resp:  [12-18] 16  (04/11 0435) BP: (92-119)/(49-78) 109/75 mmHg (04/11 0435) SpO2:  [92 %-100 %] 96 % (04/11 0435) Weight:  [90 lb 6.2 oz (41 kg)] 90 lb 6.2 oz (41 kg) (04/11 0435) Last BM Date: 01/01/12  Intake/Output this shift:    Physical Exam: BP 109/75  Pulse 90  Temp(Src) 98.1 F (36.7 C) (Axillary)  Resp 16  Ht 4\' 9"  (1.448 m)  Wt 90 lb 6.2 oz (41 kg)  BMI 19.56 kg/m2  SpO2 96%  LMP 07/28/2011 Drain site clean, no erythema. Scant serousanguinous output in tubing and bag, no output recorded. No pus or bile Tubing changed out to bulb suction. Labs: CBC  Basename 01/03/12 0353 01/02/12 0350  WBC 7.1 6.3  HGB 10.2* 9.6*  HCT 31.1* 29.2*  PLT 388 323   BMET  Basename 01/04/12 0354 01/02/12 0350  NA -- 131*  K -- 4.1  CL -- 98  CO2 -- 25  GLUCOSE -- 141*  BUN -- <3*  CREATININE 0.56 0.41*  CALCIUM -- 8.8   LFT No results found for this basename: PROT,ALBUMIN,AST,ALT,ALKPHOS,BILITOT,BILIDIR,IBILI,LIPASE in the last 72 hours PT/INR  Basename 01/03/12 1015  LABPROT 18.7*  INR 1.53*   ABG No results found for this basename: PHART:2,PCO2:2,PO2:2,HCO3:2 in the last 72 hours  Studies/Results: Ct Angio Chest W/cm &/or Wo Cm  01/02/2012  *RADIOLOGY REPORT*  Clinical Data:  Abscess.  Ileostomy.  Pulmonary embolism.  Short of breath.  Rectal cancer.  Colon resection.  Diarrhea.  CT ANGIOGRAPHY CHEST WITH CONTRAST  Technique:  Multidetector CT imaging of the chest was performed using the standard protocol during bolus administration of intravenous contrast.  Multiplanar CT image reconstructions including MIPs were obtained to evaluate the vascular anatomy.  Contrast: OMNIPAQUE IOHEXOL 300 MG/ML  SOLN  Comparison:   10/27/2011.  08/31/2011.  Findings:  Stable low attenuation right thyroid lobe lesion.  No axillary adenopathy.  Small right and small to moderate left pleural effusions are present with associated compressive atelectasis.  Grossly the heart appears within normal limits.  Technically adequate study for evaluation of pulmonary embolus.  No aggressive osseous lesions are present.  Review of the MIP images confirms the above findings.  IMPRESSION: 1.  Technically adequate study without pulmonary embolus or acute aortic abnormality. 2.  Small right and moderate left pleural effusions with associated compressive atelectasis. 3.  Stable right thyroid lobe nodule.  CT ABDOMEN AND PELVIS WITH CONTRAST  Technique:  Multidetector CT imaging of the abdomen and pelvis was performed using the standard protocol following bolus administration of intravenous contrast.  Findings:  There is free air in the abdomen. Left upper quadrant fluid has decreased compared to the prior exam.  The free air has decreased compared to the prior exam however this has not yet resolved.  Interval removal of left abdominal drain.  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  This is a loop ileostomy, with continuation into an ileal pouch.  The patient is status post total colectomy with ileoanal anastomosis.  Fluid within the pouch has decreased compared to the prior exam of 12/18/2011.  There is no air in the anatomic pelvis.  Uterus appears within normal limits aside from engorged left pelvic veins  which can be associated with pelvic congestion syndrome.  Normal renal enhancement.  The adrenal glands appear normal.  There is no portal venous gas.  Tiny low density lesions in the liver are too small to characterize.  Pancreas grossly appears normal.  No calcified stones.  No biliary ductal dilation.  Stranding is present in the expected location of the descending colon and in the mesenteric fat compatible with recent postoperative state.  The  subcutaneous air in the left anterior abdominal wall is less than on the prior exam but still greater than expected.  Oral contrast is present in the stomach and small bowel and the ostomy in the right lower quadrant has contrast.  There is no extravasation of contrast into the abdomen.  No abscess is present. No aggressive osseous lesions are identified.   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  IMPRESSION: 1.  Persistent intra-abdominal air and left anterior abdominal wall subcutaneous air.  This is greater than expected.  The drain was removed 3 days ago and there is no enteric leak to account for persistent intra-abdominal free air. 2.  Total colectomy with ileal pouch.  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 3. Loculated subphrenic left upper quadrant fluid is contiguous with the intra-abdominal air.  On coronal reconstructed images this shows peripheral enhancement and is suggestive of developing abscess.  This does not clearly communicate with abscess over the right hepatic lobe.  This does not clearly to be K with gas overlying the right hepatic lobe.  The case was discussed with Dr. Donell Beers at the time of dictation.  Original Report Authenticated By: Andreas Newport, M.D.   Ct Guided Abscess Drain  01/03/2012  *RADIOLOGY REPORT*  Clinical data:  Colectomy with postoperative peritoneal gas and fluid collections, demonstrating partial improvement on recent CT. Poor clinical progression.  Percutaneous aspiration/drainage is requested.  CT-GUIDED PERITONEAL ABSCESS DRAINAGE CATHETER PLACEMENT  Technique and findings: The procedure, risks (including but not limited to bleeding, infection, organ damage), benefits, and alternatives were explained to the patient.  Questions regarding the procedure were encouraged and answered.  The patient understands and consents to the procedure.Select axial scans through the upper abdomen were performed in the residual  left upper quadrant gas / fluid collection was localized.  An appropriate skin entry site was identified. Operator donned sterile gloves and mask. Site was marked, prepped with Betadine, draped in usual sterile fashion, infiltrated locally with 1% lidocaine.  Intravenous Fentanyl and Versed were administered as conscious sedation during continuous cardiorespiratory monitoring by the radiology RN, with a total moderate sedation time of 20 minutes.  Under CT fluoroscopic guidance, a 19 gauge percutaneous entry needle was advanced into the inferior aspect of the collection.  A Benson guide wire advanced within the collection, its position confirmed on CT fluoroscopy.  The tract was dilated with vascular dilators to facilitate placement of a 12-French pigtail catheter, formed within the dependent aspect of the left upper quadrant subphrenic gas/fluid collection.  Approximately 9 ml of thin blood tinged fluid were aspirated through the catheter, a sample sent for Gram stain, culture and sensitivity.  The catheter secured externally with O-Prolene suture and Statlock, and placed to gravity bag. The patient tolerated the procedure well.  No immediate complication.  IMPRESSION: 1.  Technically successful CT-guided peritoneal abscess drainage catheter placement.  Original Report Authenticated By: Osa Craver, M.D.   Ct Abdomen Pelvis W Contrast  01/02/2012  *RADIOLOGY REPORT*  Clinical  Data:  Abscess.  Ileostomy.  Pulmonary embolism.  Short of breath.  Rectal cancer.  Colon resection.  Diarrhea.  CT ANGIOGRAPHY CHEST WITH CONTRAST  Technique:  Multidetector CT imaging of the chest was performed using the standard protocol during bolus administration of intravenous contrast.  Multiplanar CT image reconstructions including MIPs were obtained to evaluate the vascular anatomy.  Contrast: OMNIPAQUE IOHEXOL 300 MG/ML  SOLN  Comparison:  10/27/2011.  08/31/2011.  Findings:  Stable low attenuation right thyroid  lobe lesion.  No axillary adenopathy.  Small right and small to moderate left pleural effusions are present with associated compressive atelectasis.  Grossly the heart appears within normal limits.  Technically adequate study for evaluation of pulmonary embolus.  No aggressive osseous lesions are present.  Review of the MIP images confirms the above findings.  IMPRESSION: 1.  Technically adequate study without pulmonary embolus or acute aortic abnormality. 2.  Small right and moderate left pleural effusions with associated compressive atelectasis. 3.  Stable right thyroid lobe nodule.  CT ABDOMEN AND PELVIS WITH CONTRAST  Technique:  Multidetector CT imaging of the abdomen and pelvis was performed using the standard protocol following bolus administration of intravenous contrast.  Findings:  There is free air in the abdomen. Left upper quadrant fluid has decreased compared to the prior exam.  The free air has decreased compared to the prior exam however this has not yet resolved.  Interval removal of left abdominal drain.  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  This is a loop ileostomy, with continuation into an ileal pouch.  The patient is status post total colectomy with ileoanal anastomosis.  Fluid within the pouch has decreased compared to the prior exam of 12/18/2011.  There is no air in the anatomic pelvis.  Uterus appears within normal limits aside from engorged left pelvic veins which can be associated with pelvic congestion syndrome.  Normal renal enhancement.  The adrenal glands appear normal.  There is no portal venous gas.  Tiny low density lesions in the liver are too small to characterize.  Pancreas grossly appears normal.  No calcified stones.  No biliary ductal dilation.  Stranding is present in the expected location of the descending colon and in the mesenteric fat compatible with recent postoperative state.  The subcutaneous air in the left anterior abdominal wall is less than on the  prior exam but still greater than expected.  Oral contrast is present in the stomach and small bowel and the ostomy in the right lower quadrant has contrast.  There is no extravasation of contrast into the abdomen.  No abscess is present. No aggressive osseous lesions are identified.   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  IMPRESSION: 1.  Persistent intra-abdominal air and left anterior abdominal wall subcutaneous air.  This is greater than expected.  The drain was removed 3 days ago and there is no enteric leak to account for persistent intra-abdominal free air. 2.  Total colectomy with ileal pouch.  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 3. Loculated subphrenic left upper quadrant fluid is contiguous with the intra-abdominal air.  On coronal reconstructed images this shows peripheral enhancement and is suggestive of developing abscess.  This does not clearly communicate with abscess over the right hepatic lobe.  This does not clearly to be K with gas overlying the right hepatic lobe.  The case was discussed with Dr. Donell Beers at the time of dictation.  Original Report  Authenticated By: Andreas Newport, M.D.    Assessment/Plan: S/p perc drain of subphrenic abscess. Scant output, Cx pending Will follow Dr. Tonny Bollman.    LOS: 21 days    Brayton El PA-C 01/04/2012 8:45 AM

## 2012-01-04 NOTE — Consult Note (Signed)
WOC ostomy consult  Stoma type/location:  RLQ, ileosotmy Output liquid brown stool, small amount Ostomy pouching: 1pc.convex, in place changed last pm per nursing staff, dated pouch.    Education provided: Reminded pt of need to drink lots of fluids to prevent dehydration, she is NPO currently due to recent testing. She verbalized understanding of need for fluid intact with her ileostomy.  WOC will follow along with you Armen Pickup, RN, [employer] [phone]

## 2012-01-04 NOTE — Progress Notes (Signed)
Patient ID: Renee Nicholson, female   DOB: 1966/03/03, 46 y.o.   MRN: [id number]   Subjective: Drain placed in IR yesterday.  Serosanguinous output/old blood.  No purulent material or bilious material.    Objective: Vital signs in last 24 hours: Temp:  [98 F (36.7 C)-98.9 F (37.2 C)] 98.1 F (36.7 C) (04/11 0435) Pulse Rate:  [90-111] 90  (04/11 0435) Resp:  [12-18] 16  (04/11 0435) BP: (92-119)/(49-78) 109/75 mmHg (04/11 0435) SpO2:  [92 %-100 %] 96 % (04/11 0435) Weight:  [90 lb 6.2 oz (41 kg)] 90 lb 6.2 oz (41 kg) (04/11 0435) Last BM Date: 01/01/12  Intake/Output from previous day: 04/10 0701 - 04/11 0700 In: 1 [P.O.:1] Out: 1000 [Urine:525; Stool:475] Intake/Output this shift:    General appearance: alert, no distress GI: normal findings: soft, non-tender, drain with no evidence of pus.   Ostomy output remains more solid.  Lab Results:   Basename 01/03/12 0353 01/02/12 0350  WBC 7.1 6.3  HGB 10.2* 9.6*  HCT 31.1* 29.2*  PLT 388 323   BMET  Basename 01/04/12 0354 01/02/12 0350  NA -- 131*  K -- 4.1  CL -- 98  CO2 -- 25  GLUCOSE -- 141*  BUN -- <3*  CREATININE 0.56 0.41*  CALCIUM -- 8.8     Studies/Results: Ct Angio Chest W/cm &/or Wo Cm  01/02/2012  *RADIOLOGY REPORT*  Clinical Data:  Abscess.  Ileostomy.  Pulmonary embolism.  Short of breath.  Rectal cancer.  Colon resection.  Diarrhea.  CT ANGIOGRAPHY CHEST WITH CONTRAST  Technique:  Multidetector CT imaging of the chest was performed using the standard protocol during bolus administration of intravenous contrast.  Multiplanar CT image reconstructions including MIPs were obtained to evaluate the vascular anatomy.  Contrast: OMNIPAQUE IOHEXOL 300 MG/ML  SOLN  Comparison:  10/27/2011.  08/31/2011.  Findings:  Stable low attenuation right thyroid lobe lesion.  No axillary adenopathy.  Small right and small to moderate left pleural effusions are present with associated compressive atelectasis.  Grossly the  heart appears within normal limits.  Technically adequate study for evaluation of pulmonary embolus.  No aggressive osseous lesions are present.  Review of the MIP images confirms the above findings.  IMPRESSION: 1.  Technically adequate study without pulmonary embolus or acute aortic abnormality. 2.  Small right and moderate left pleural effusions with associated compressive atelectasis. 3.  Stable right thyroid lobe nodule.  CT ABDOMEN AND PELVIS WITH CONTRAST  Technique:  Multidetector CT imaging of the abdomen and pelvis was performed using the standard protocol following bolus administration of intravenous contrast.  Findings:  There is free air in the abdomen. Left upper quadrant fluid has decreased compared to the prior exam.  The free air has decreased compared to the prior exam however this has not yet resolved.  Interval removal of left abdominal drain.  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  This is a loop ileostomy, with continuation into an ileal pouch.  The patient is status post total colectomy with ileoanal anastomosis.  Fluid within the pouch has decreased compared to the prior exam of 12/18/2011.  There is no air in the anatomic pelvis.  Uterus appears within normal limits aside from engorged left pelvic veins which can be associated with pelvic congestion syndrome.  Normal renal enhancement.  The adrenal glands appear normal.  There is no portal venous gas.  Tiny low density lesions in the liver are too small to characterize.  Pancreas grossly  appears normal.  No calcified stones.  No biliary ductal dilation.  Stranding is present in the expected location of the descending colon and in the mesenteric fat compatible with recent postoperative state.  The subcutaneous air in the left anterior abdominal wall is less than on the prior exam but still greater than expected.  Oral contrast is present in the stomach and small bowel and the ostomy in the right lower quadrant has contrast.  There  is no extravasation of contrast into the abdomen.  No abscess is present. No aggressive osseous lesions are identified.   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  IMPRESSION: 1.  Persistent intra-abdominal air and left anterior abdominal wall subcutaneous air.  This is greater than expected.  The drain was removed 3 days ago and there is no enteric leak to account for persistent intra-abdominal free air. 2.  Total colectomy with ileal pouch.  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 3. Loculated subphrenic left upper quadrant fluid is contiguous with the intra-abdominal air.  On coronal reconstructed images this shows peripheral enhancement and is suggestive of developing abscess.  This does not clearly communicate with abscess over the right hepatic lobe.  This does not clearly to be K with gas overlying the right hepatic lobe.  The case was discussed with Dr. Donell Beers at the time of dictation.  Original Report Authenticated By: Renee Nicholson, M.D.   Ct Guided Abscess Drain  01/03/2012  *RADIOLOGY REPORT*  Clinical data:  Colectomy with postoperative peritoneal gas and fluid collections, demonstrating partial improvement on recent CT. Poor clinical progression.  Percutaneous aspiration/drainage is requested.  CT-GUIDED PERITONEAL ABSCESS DRAINAGE CATHETER PLACEMENT  Technique and findings: The procedure, risks (including but not limited to bleeding, infection, organ damage), benefits, and alternatives were explained to the patient.  Questions regarding the procedure were encouraged and answered.  The patient understands and consents to the procedure.Select axial scans through the upper abdomen were performed in the residual left upper quadrant gas / fluid collection was localized.  An appropriate skin entry site was identified. Operator donned sterile gloves and mask. Site was marked, prepped with Betadine, draped in usual sterile fashion, infiltrated locally  with 1% lidocaine.  Intravenous Fentanyl and Versed were administered as conscious sedation during continuous cardiorespiratory monitoring by the radiology RN, with a total moderate sedation time of 20 minutes.  Under CT fluoroscopic guidance, a 19 gauge percutaneous entry needle was advanced into the inferior aspect of the collection.  A Benson guide wire advanced within the collection, its position confirmed on CT fluoroscopy.  The tract was dilated with vascular dilators to facilitate placement of a 12-French pigtail catheter, formed within the dependent aspect of the left upper quadrant subphrenic gas/fluid collection.  Approximately 9 ml of thin blood tinged fluid were aspirated through the catheter, a sample sent for Gram stain, culture and sensitivity.  The catheter secured externally with O-Prolene suture and Statlock, and placed to gravity bag. The patient tolerated the procedure well.  No immediate complication.  IMPRESSION: 1.  Technically successful CT-guided peritoneal abscess drainage catheter placement.  Original Report Authenticated By: Osa Craver, M.D.   Ct Abdomen Pelvis W Contrast  01/02/2012  *RADIOLOGY REPORT*  Clinical Data:  Abscess.  Ileostomy.  Pulmonary embolism.  Short of breath.  Rectal cancer.  Colon resection.  Diarrhea.  CT ANGIOGRAPHY CHEST WITH CONTRAST  Technique:  Multidetector CT imaging of the chest was performed using the standard protocol  during bolus administration of intravenous contrast.  Multiplanar CT image reconstructions including MIPs were obtained to evaluate the vascular anatomy.  Contrast: OMNIPAQUE IOHEXOL 300 MG/ML  SOLN  Comparison:  10/27/2011.  08/31/2011.  Findings:  Stable low attenuation right thyroid lobe lesion.  No axillary adenopathy.  Small right and small to moderate left pleural effusions are present with associated compressive atelectasis.  Grossly the heart appears within normal limits.  Technically adequate study for evaluation of  pulmonary embolus.  No aggressive osseous lesions are present.  Review of the MIP images confirms the above findings.  IMPRESSION: 1.  Technically adequate study without pulmonary embolus or acute aortic abnormality. 2.  Small right and moderate left pleural effusions with associated compressive atelectasis. 3.  Stable right thyroid lobe nodule.  CT ABDOMEN AND PELVIS WITH CONTRAST  Technique:  Multidetector CT imaging of the abdomen and pelvis was performed using the standard protocol following bolus administration of intravenous contrast.  Findings:  There is free air in the abdomen. Left upper quadrant fluid has decreased compared to the prior exam.  The free air has decreased compared to the prior exam however this has not yet resolved.  Interval removal of left abdominal drain.  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  This is a loop ileostomy, with continuation into an ileal pouch.  The patient is status post total colectomy with ileoanal anastomosis.  Fluid within the pouch has decreased compared to the prior exam of 12/18/2011.  There is no air in the anatomic pelvis.  Uterus appears within normal limits aside from engorged left pelvic veins which can be associated with pelvic congestion syndrome.  Normal renal enhancement.  The adrenal glands appear normal.  There is no portal venous gas.  Tiny low density lesions in the liver are too small to characterize.  Pancreas grossly appears normal.  No calcified stones.  No biliary ductal dilation.  Stranding is present in the expected location of the descending colon and in the mesenteric fat compatible with recent postoperative state.  The subcutaneous air in the left anterior abdominal wall is less than on the prior exam but still greater than expected.  Oral contrast is present in the stomach and small bowel and the ostomy in the right lower quadrant has contrast.  There is no extravasation of contrast into the abdomen.  No abscess is present. No  aggressive osseous lesions are identified.   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  IMPRESSION: 1.  Persistent intra-abdominal air and left anterior abdominal wall subcutaneous air.  This is greater than expected.  The drain was removed 3 days ago and there is no enteric leak to account for persistent intra-abdominal free air. 2.  Total colectomy with ileal pouch.  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 3. Loculated subphrenic left upper quadrant fluid is contiguous with the intra-abdominal air.  On coronal reconstructed images this shows peripheral enhancement and is suggestive of developing abscess.  This does not clearly communicate with abscess over the right hepatic lobe.  This does not clearly to be K with gas overlying the right hepatic lobe.  The case was discussed with Dr. Donell Beers at the time of dictation.  Original Report Authenticated By: Renee Nicholson, M.D.    Anti-infectives: Anti-infectives     Start     Dose/Rate Route Frequency Ordered Stop   01/03/12 1000   piperacillin-tazobactam (ZOSYN) IVPB 3.375 g  3.375 g 12.5 mL/hr over 240 Minutes Intravenous Every 8 hours 01/03/12 0855     01/03/12 1000   vancomycin (VANCOCIN) 500 mg in sodium chloride 0.9 % 100 mL IVPB        500 mg 100 mL/hr over 60 Minutes Intravenous Every 12 hours 01/03/12 0922     12/29/11 0900   fluconazole (DIFLUCAN) IVPB 100 mg        100 mg 50 mL/hr over 60 Minutes Intravenous Every 24 hours 12/29/11 0803 12/31/11 1006   12/26/11 0800   fluconazole (DIFLUCAN) IVPB 100 mg  Status:  Discontinued        100 mg 50 mL/hr over 60 Minutes Intravenous Every 24 hours 12/25/11 1408 12/28/11 1204   12/25/11 1500   fluconazole (DIFLUCAN) IVPB 200 mg        200 mg 100 mL/hr over 60 Minutes Intravenous  Once 12/25/11 1408 12/25/11 1628   12/18/11 1830   piperacillin-tazobactam (ZOSYN) IVPB 3.375 g  Status:  Discontinued        3.375 g 12.5 mL/hr over 240  Minutes Intravenous 3 times per day 12/18/11 1744 12/23/11 0840   12/15/11 0800   ertapenem (INVANZ) 1 g in sodium chloride 0.9 % 50 mL IVPB        1 g 100 mL/hr over 30 Minutes Intravenous Every 24 hours 12/14/11 1537 12/15/11 0812   12/14/11 0527   ertapenem (INVANZ) 1 g in sodium chloride 0.9 % 50 mL IVPB        1 g 100 mL/hr over 30 Minutes Intravenous 60 min pre-op 12/14/11 0527 12/14/11 0805          Assessment/Plan: s/p Procedure(s): Lap TAC, IPAA, diverting ostomy Severe reflux esophagitis improving on maximal medical therapy Megace for appetite stimulation Imodium at max Perc drain to bulb suction. Await culture on drainage.  If cx negative, d/c drain and antibiotics. Swallow study to rule out microperforation.   Hope for home in next few days.     LOS: 21 days    [place] 01/04/2012

## 2012-01-05 MED ORDER — ENOXAPARIN SODIUM 30 MG/0.3ML ~~LOC~~ SOLN
30.0000 mg | SUBCUTANEOUS | Status: DC
  Administered 2012-01-05 – 2012-01-09 (×5): 30 mg via SUBCUTANEOUS
  Filled 2012-01-05 (×5): qty 0.3

## 2012-01-05 MED ORDER — SODIUM CHLORIDE 0.9 % IJ SOLN
10.0000 mL | Freq: Two times a day (BID) | INTRAMUSCULAR | Status: DC
  Administered 2012-01-05 – 2012-01-08 (×6): 10 mL
  Administered 2012-01-09: 20 mL

## 2012-01-05 MED ORDER — SODIUM CHLORIDE 0.9 % IJ SOLN: 10.0000 mL | INTRAMUSCULAR | Status: DC | PRN

## 2012-01-05 NOTE — Progress Notes (Signed)
Patient ID: Renee Nicholson, female   DOB: 1966-02-17, 46 y.o.   MRN: [id number]   Subjective: Drain with minimal output.  Pt still feels weak.  Having difficulty with feeling full.  Objective: Vital signs in last 24 hours: Temp:  [97.7 F (36.5 C)-99.5 F (37.5 C)] 97.7 F (36.5 C) (04/12 0445) Pulse Rate:  [93-105] 93  (04/12 0445) Resp:  [16-17] 16  (04/12 0445) BP: (123-126)/(80-84) 126/84 mmHg (04/12 0445) SpO2:  [94 %-97 %] 97 % (04/12 0445) Weight:  [92 lb 9.5 oz (42 kg)] 92 lb 9.5 oz (42 kg) (04/12 0445) Last BM Date: 01/04/12  Intake/Output from previous day: 04/11 0701 - 04/12 0700 In: 783.6 [I.V.:633.6; IV Piggyback:150] Out: 1966 [Urine:775; Stool:1190] Intake/Output this shift:    General appearance: alert, no distress GI: normal findings: soft, non-tender, drain with no evidence of pus.   Ostomy output increased yesterday.    Lab Results:   [facility] 01/03/12 0353  WBC 7.1  HGB 10.2*  HCT 31.1*  PLT 388   BMET  Basename 01/04/12 0354  NA --  K --  CL --  CO2 --  GLUCOSE --  BUN --  CREATININE 0.56  CALCIUM --     Studies/Results: Ct Guided Abscess Drain  01/03/2012  *RADIOLOGY REPORT*  Clinical data:  Colectomy with postoperative peritoneal gas and fluid collections, demonstrating partial improvement on recent CT. Poor clinical progression.  Percutaneous aspiration/drainage is requested.  CT-GUIDED PERITONEAL ABSCESS DRAINAGE CATHETER PLACEMENT  Technique and findings: The procedure, risks (including but not limited to bleeding, infection, organ damage), benefits, and alternatives were explained to the patient.  Questions regarding the procedure were encouraged and answered.  The patient understands and consents to the procedure.Select axial scans through the upper abdomen were performed in the residual left upper quadrant gas / fluid collection was localized.  An appropriate skin entry site was identified. Operator donned sterile gloves and mask. Site  was marked, prepped with Betadine, draped in usual sterile fashion, infiltrated locally with 1% lidocaine.  Intravenous Fentanyl and Versed were administered as conscious sedation during continuous cardiorespiratory monitoring by the radiology RN, with a total moderate sedation time of 20 minutes.  Under CT fluoroscopic guidance, a 19 gauge percutaneous entry needle was advanced into the inferior aspect of the collection.  A Benson guide wire advanced within the collection, its position confirmed on CT fluoroscopy.  The tract was dilated with vascular dilators to facilitate placement of a 12-French pigtail catheter, formed within the dependent aspect of the left upper quadrant subphrenic gas/fluid collection.  Approximately 9 ml of thin blood tinged fluid were aspirated through the catheter, a sample sent for Gram stain, culture and sensitivity.  The catheter secured externally with O-Prolene suture and Statlock, and placed to gravity bag. The patient tolerated the procedure well.  No immediate complication.  IMPRESSION: 1.  Technically successful CT-guided peritoneal abscess drainage catheter placement.  Original Report Authenticated By: Osa Craver, M.D.   Dg Kayleen Memos W/water Sol Cm  01/04/2012  *RADIOLOGY REPORT*  Clinical Data:  Recent pneumoperitoneum and abscess with drain placement.  Evaluate for leak.  UPPER GI SERIES WITH KUB  Technique:  Routine upper GI series was performed with water soluble contrast.  Fluoroscopy Time: 1.2 minutes  Comparison:  CT abdomen pelvis 01/02/2012.  Findings: Scout view of the abdomen shows a pigtail catheter projecting over the left upper quadrant.  Air is seen within the stomach.  Colostomy in the right lower quadrant.  Relative paucity  of gas elsewhere.  Incidental imaging of the lower chest shows bilateral pleural effusions, left greater than right.  The patient drank water soluble contrast.  No evidence of a leak.  IMPRESSION:  1.  No leak. 2.  Small bilateral  pleural effusions.  Original Report Authenticated By: Reyes Ivan, M.D.    Anti-infectives: Anti-infectives     Start     Dose/Rate Route Frequency Ordered Stop   01/03/12 1000   piperacillin-tazobactam (ZOSYN) IVPB 3.375 g        3.375 g 12.5 mL/hr over 240 Minutes Intravenous Every 8 hours 01/03/12 0855     01/03/12 1000   vancomycin (VANCOCIN) 500 mg in sodium chloride 0.9 % 100 mL IVPB        500 mg 100 mL/hr over 60 Minutes Intravenous Every 12 hours 01/03/12 0922     12/29/11 0900   fluconazole (DIFLUCAN) IVPB 100 mg        100 mg 50 mL/hr over 60 Minutes Intravenous Every 24 hours 12/29/11 0803 12/31/11 1006   12/26/11 0800   fluconazole (DIFLUCAN) IVPB 100 mg  Status:  Discontinued        100 mg 50 mL/hr over 60 Minutes Intravenous Every 24 hours 12/25/11 1408 12/28/11 1204   12/25/11 1500   fluconazole (DIFLUCAN) IVPB 200 mg        200 mg 100 mL/hr over 60 Minutes Intravenous  Once 12/25/11 1408 12/25/11 1628   12/18/11 1830   piperacillin-tazobactam (ZOSYN) IVPB 3.375 g  Status:  Discontinued        3.375 g 12.5 mL/hr over 240 Minutes Intravenous 3 times per day 12/18/11 1744 12/23/11 0840   12/15/11 0800   ertapenem (INVANZ) 1 g in sodium chloride 0.9 % 50 mL IVPB        1 g 100 mL/hr over 30 Minutes Intravenous Every 24 hours 12/14/11 1537 12/15/11 0812   12/14/11 0527   ertapenem (INVANZ) 1 g in sodium chloride 0.9 % 50 mL IVPB        1 g 100 mL/hr over 30 Minutes Intravenous 60 min pre-op 12/14/11 0527 12/14/11 0805          Assessment/Plan: s/p Procedure(s): Lap TAC, IPAA, diverting ostomy On medical therapy for esophagitis Megace for appetite stimulation Imodium at max Perc drain to bulb suction. Await culture on drainage.  If cx negative, d/c drain and antibiotics. Will put in picc for IV fluids at home intermittently.   Hope for home in next few days.     LOS: 22 days    Renee Nicholson 01/05/2012

## 2012-01-05 NOTE — Progress Notes (Signed)
9 Days Post-Op  Subjective: Pt ok. No new c/o  Objective: Vital signs in last 24 hours: Temp:  [97.7 F (36.5 C)-99.5 F (37.5 C)] 97.7 F (36.5 C) (04/12 0445) Pulse Rate:  [93-105] 93  (04/12 0445) Resp:  [16-17] 16  (04/12 0445) BP: (123-126)/(80-84) 126/84 mmHg (04/12 0445) SpO2:  [94 %-97 %] 97 % (04/12 0445) Weight:  [92 lb 9.5 oz (42 kg)] 92 lb 9.5 oz (42 kg) (04/12 0445) Last BM Date: 01/04/12  Intake/Output this shift:    Physical Exam: BP 126/84  Pulse 93  Temp(Src) 97.7 F (36.5 C) (Axillary)  Resp 16  Ht 4\' 9"  (1.448 m)  Wt 92 lb 9.5 oz (42 kg)  BMI 20.04 kg/m2  SpO2 97%  LMP 07/28/2011 Drain site clean, dry. Scant thin serous output in tubing, but almost nothing in bulb  Labs: CBC  Basename 01/03/12 0353  WBC 7.1  HGB 10.2*  HCT 31.1*  PLT 388   BMET  Basename 01/04/12 0354  NA --  K --  CL --  CO2 --  GLUCOSE --  BUN --  CREATININE 0.56  CALCIUM --   LFT No results found for this basename: PROT,ALBUMIN,AST,ALT,ALKPHOS,BILITOT,BILIDIR,IBILI,LIPASE in the last 72 hours PT/INR  Basename 01/03/12 1015  LABPROT 18.7*  INR 1.53*   ABG No results found for this basename: PHART:2,PCO2:2,PO2:2,HCO3:2 in the last 72 hours  Studies/Results: Ct Guided Abscess Drain  01/03/2012  *RADIOLOGY REPORT*  Clinical data:  Colectomy with postoperative peritoneal gas and fluid collections, demonstrating partial improvement on recent CT. Poor clinical progression.  Percutaneous aspiration/drainage is requested.  CT-GUIDED PERITONEAL ABSCESS DRAINAGE CATHETER PLACEMENT  Technique and findings: The procedure, risks (including but not limited to bleeding, infection, organ damage), benefits, and alternatives were explained to the patient.  Questions regarding the procedure were encouraged and answered.  The patient understands and consents to the procedure.Select axial scans through the upper abdomen were performed in the residual left upper quadrant gas / fluid  collection was localized.  An appropriate skin entry site was identified. Operator donned sterile gloves and mask. Site was marked, prepped with Betadine, draped in usual sterile fashion, infiltrated locally with 1% lidocaine.  Intravenous Fentanyl and Versed were administered as conscious sedation during continuous cardiorespiratory monitoring by the radiology RN, with a total moderate sedation time of 20 minutes.  Under CT fluoroscopic guidance, a 19 gauge percutaneous entry needle was advanced into the inferior aspect of the collection.  A Benson guide wire advanced within the collection, its position confirmed on CT fluoroscopy.  The tract was dilated with vascular dilators to facilitate placement of a 12-French pigtail catheter, formed within the dependent aspect of the left upper quadrant subphrenic gas/fluid collection.  Approximately 9 ml of thin blood tinged fluid were aspirated through the catheter, a sample sent for Gram stain, culture and sensitivity.  The catheter secured externally with O-Prolene suture and Statlock, and placed to gravity bag. The patient tolerated the procedure well.  No immediate complication.  IMPRESSION: 1.  Technically successful CT-guided peritoneal abscess drainage catheter placement.  Original Report Authenticated By: Osa Craver, M.D.   Dg Kayleen Memos W/water Sol Cm  01/04/2012  *RADIOLOGY REPORT*  Clinical Data:  Recent pneumoperitoneum and abscess with drain placement.  Evaluate for leak.  UPPER GI SERIES WITH KUB  Technique:  Routine upper GI series was performed with water soluble contrast.  Fluoroscopy Time: 1.2 minutes  Comparison:  CT abdomen pelvis 01/02/2012.  Findings: Scout view of the abdomen  shows a pigtail catheter projecting over the left upper quadrant.  Air is seen within the stomach.  Colostomy in the right lower quadrant.  Relative paucity of gas elsewhere.  Incidental imaging of the lower chest shows bilateral pleural effusions, left greater than  right.  The patient drank water soluble contrast.  No evidence of a leak.  IMPRESSION:  1.  No leak. 2.  Small bilateral pleural effusions.  Original Report Authenticated By: Reyes Ivan, M.D.    Assessment/Plan: S/p perc drain of subphrenic abscess.  Scant output, Cx pending (-)so far Will follow Dr. Tonny Bollman. Planning for DC next few days, suspect will remove drain prior to DC.     LOS: 22 days    Brayton El PA-C 01/05/2012 9:14 AM

## 2012-01-06 MED ORDER — SODIUM CHLORIDE 0.9 % IV SOLN
750.0000 mg | Freq: Two times a day (BID) | INTRAVENOUS | Status: DC
  Filled 2012-01-06: qty 750

## 2012-01-06 NOTE — Progress Notes (Signed)
10 Days Post-Op  Subjective: Eating some solid food.  Drinking Ensure.  Objective: Vital signs in last 24 hours: Temp:  [97.7 F (36.5 C)-99.2 F (37.3 C)] 98.1 F (36.7 C) (04/13 0640) Pulse Rate:  [93-98] 93  (04/13 0640) Resp:  [17-18] 17  (04/13 0640) BP: (101-144)/(67-87) 134/87 mmHg (04/13 0640) SpO2:  [97 %-100 %] 98 % (04/13 0640) Weight:  [92 lb 9.5 oz (42 kg)] 92 lb 9.5 oz (42 kg) (04/13 0640) Last BM Date: 01/06/12  Intake/Output from previous day: 04/12 0701 - 04/13 0700 In: 240 [P.O.:240] Out: 2480 [Urine:1900; Stool:580] Intake/Output this shift:    PE: Abd-soft, nontender, ileostomy with liquid output, serous drain output.  Lab Results:  No results found for this basename: WBC:2,HGB:2,HCT:2,PLT:2 in the last 72 hours BMET  Basename 01/04/12 0354  NA --  K --  CL --  CO2 --  GLUCOSE --  BUN --  CREATININE 0.56  CALCIUM --   PT/INR  Basename 01/03/12 1015  LABPROT 18.7*  INR 1.53*   Comprehensive Metabolic Panel:    Component Value Date/Time   NA 131* 01/02/2012 0350   K 4.1 01/02/2012 0350   CL 98 01/02/2012 0350   CO2 25 01/02/2012 0350   BUN <3* 01/02/2012 0350   CREATININE 0.56 01/04/2012 0354   CREATININE 0.70 07/27/2011 1306   GLUCOSE 141* 01/02/2012 0350   CALCIUM 8.8 01/02/2012 0350   AST 43* 01/01/2012 0400   ALT 38* 01/01/2012 0400   ALKPHOS 128* 01/01/2012 0400   BILITOT 0.4 01/01/2012 0400   PROT 6.1 01/01/2012 0400   ALBUMIN 2.5* 01/01/2012 0400   Abdominal fluid culture- no growth, final pending.  Studies/Results: Dg Ugi W/water Sol Cm  01/04/2012  *RADIOLOGY REPORT*  Clinical Data:  Recent pneumoperitoneum and abscess with drain placement.  Evaluate for leak.  UPPER GI SERIES WITH KUB  Technique:  Routine upper GI series was performed with water soluble contrast.  Fluoroscopy Time: 1.2 minutes  Comparison:  CT abdomen pelvis 01/02/2012.  Findings: Scout view of the abdomen shows a pigtail catheter projecting over the left upper quadrant.  Air is  seen within the stomach.  Colostomy in the right lower quadrant.  Relative paucity of gas elsewhere.  Incidental imaging of the lower chest shows bilateral pleural effusions, left greater than right.  The patient drank water soluble contrast.  No evidence of a leak.  IMPRESSION:  1.  No leak. 2.  Small bilateral pleural effusions.  Original Report Authenticated By: Reyes Ivan, M.D.    Anti-infectives: Anti-infectives     Start     Dose/Rate Route Frequency Ordered Stop   01/03/12 1000  piperacillin-tazobactam (ZOSYN) IVPB 3.375 g       3.375 g 12.5 mL/hr over 240 Minutes Intravenous Every 8 hours 01/03/12 0855     01/03/12 1000   vancomycin (VANCOCIN) 500 mg in sodium chloride 0.9 % 100 mL IVPB        500 mg 100 mL/hr over 60 Minutes Intravenous Every 12 hours 01/03/12 0922     12/29/11 0900   fluconazole (DIFLUCAN) IVPB 100 mg        100 mg 50 mL/hr over 60 Minutes Intravenous Every 24 hours 12/29/11 0803 12/31/11 1006   12/26/11 0800   fluconazole (DIFLUCAN) IVPB 100 mg  Status:  Discontinued        100 mg 50 mL/hr over 60 Minutes Intravenous Every 24 hours 12/25/11 1408 12/28/11 1204   12/25/11 1500   fluconazole (DIFLUCAN)  IVPB 200 mg        200 mg 100 mL/hr over 60 Minutes Intravenous  Once 12/25/11 1408 12/25/11 1628   12/18/11 1830   piperacillin-tazobactam (ZOSYN) IVPB 3.375 g  Status:  Discontinued        3.375 g 12.5 mL/hr over 240 Minutes Intravenous 3 times per day 12/18/11 1744 12/23/11 0840   12/15/11 0800   ertapenem (INVANZ) 1 g in sodium chloride 0.9 % 50 mL IVPB        1 g 100 mL/hr over 30 Minutes Intravenous Every 24 hours 12/14/11 1537 12/15/11 0812   12/14/11 0527   ertapenem (INVANZ) 1 g in sodium chloride 0.9 % 50 mL IVPB        1 g 100 mL/hr over 30 Minutes Intravenous 60 min pre-op 12/14/11 0527 12/14/11 0805          Assessment Principal Problem:  *Rectal cancer s/p proctocolectomy, j-pouch with ileoanal anastomosis, loop ileostomy on  12/14/11  Esophagitis-improved  Intraabd abscess-on abxs, culture still pending    LOS: 23 days   Plan: Continue drain and IV abx until final culture results are seen.  Hopefully, home soon.   Lore Polka J 01/06/2012

## 2012-01-06 NOTE — Progress Notes (Addendum)
ANTIBIOTIC CONSULT NOTE - INITIAL  Pharmacy Consult for Vancomycin Indication: suspected intra-abdominal infection  No Known Allergies  Patient Measurements: Height: 4\' 9"  (144.8 cm) Weight: 92 lb 9.5 oz (42 kg) IBW/kg (Calculated) : 38.6   Vital Signs: Temp: 98.1 F (36.7 C) (04/13 0640) Temp src: Axillary (04/13 0640) BP: 134/87 mmHg (04/13 0640) Pulse Rate: 93  (04/13 0640) Intake/Output from previous day: 04/12 0701 - 04/13 0700 In: 240 [P.O.:240] Out: 2480 [Urine:1900; Stool:580] Intake/Output from this shift: Total I/O In: 10 [I.V.:10] Out: -   Labs:  Basename 01/04/12 0354  WBC --  HGB --  PLT --  LABCREA --  CREATININE 0.56   Estimated Creatinine Clearance: 53.5 ml/min (by C-G formula based on Cr of 0.56). No results found for this basename: VANCOTROUGH:2,VANCOPEAK:2,VANCORANDOM:2,GENTTROUGH:2,GENTPEAK:2,GENTRANDOM:2,TOBRATROUGH:2,TOBRAPEAK:2,TOBRARND:2,AMIKACINPEAK:2,AMIKACINTROU:2,AMIKACIN:2, in the last 72 hours   Microbiology: Recent Results (from the past 720 hour(s))  SURGICAL PCR SCREEN     Status: Normal   Collection Time   12/12/11  1:30 PM      Component Value Range Status Comment   MRSA, PCR NEGATIVE  NEGATIVE  Final    Staphylococcus aureus NEGATIVE  NEGATIVE  Final   CULTURE, ROUTINE-ABSCESS     Status: Normal (Preliminary result)   Collection Time   01/03/12  3:15 PM      Component Value Range Status Comment   Specimen Description DRAINAGE   Final    Special Requests NONE   Final    Gram Stain     Final    Value: NO WBC SEEN     NO SQUAMOUS EPITHELIAL CELLS SEEN     NO ORGANISMS SEEN   Culture NO GROWTH 2 DAYS   Final    Report Status PENDING   Incomplete     Medical History: Past Medical History  Diagnosis Date  . Anemia   . Diarrhea   . Rectal cancer 08/16/2011  . Colon cancer   . Anxiety   . Bright red rectal bleeding 09/13/2011  . History of chemotherapy     completed 09/2011     Medications:  Anti-infectives     Start      Dose/Rate Route Frequency Ordered Stop   01/03/12 1000  piperacillin-tazobactam (ZOSYN) IVPB 3.375 g       3.375 g 12.5 mL/hr over 240 Minutes Intravenous Every 8 hours 01/03/12 0855     01/03/12 1000   vancomycin (VANCOCIN) 500 mg in sodium chloride 0.9 % 100 mL IVPB        500 mg 100 mL/hr over 60 Minutes Intravenous Every 12 hours 01/03/12 0922     12/29/11 0900   fluconazole (DIFLUCAN) IVPB 100 mg        100 mg 50 mL/hr over 60 Minutes Intravenous Every 24 hours 12/29/11 0803 12/31/11 1006   12/26/11 0800   fluconazole (DIFLUCAN) IVPB 100 mg  Status:  Discontinued        100 mg 50 mL/hr over 60 Minutes Intravenous Every 24 hours 12/25/11 1408 12/28/11 1204   12/25/11 1500   fluconazole (DIFLUCAN) IVPB 200 mg        200 mg 100 mL/hr over 60 Minutes Intravenous  Once 12/25/11 1408 12/25/11 1628   12/18/11 1830   piperacillin-tazobactam (ZOSYN) IVPB 3.375 g  Status:  Discontinued        3.375 g 12.5 mL/hr over 240 Minutes Intravenous 3 times per day 12/18/11 1744 12/23/11 0840   12/15/11 0800   ertapenem (INVANZ) 1 g in sodium chloride 0.9 %  50 mL IVPB        1 g 100 mL/hr over 30 Minutes Intravenous Every 24 hours 12/14/11 1537 12/15/11 0812   12/14/11 0527   ertapenem (INVANZ) 1 g in sodium chloride 0.9 % 50 mL IVPB        1 g 100 mL/hr over 30 Minutes Intravenous 60 min pre-op 12/14/11 0527 12/14/11 0805         Assessment:  45yo F had lap total colectomy, creation of an ileostomy on 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 Also odynophagia d/t severe reflux esophagitis. Fever and small air/fluid collection in upper abdomen.   S/p perc drain of subphrenic abscess with empiric antibiotics Zosyn (per MD) and Vanc (per pharmacy) for possible intra-abdominal infection.  Renal function has remained stable, with normal serum creatinine, low weight.   Culture of abscess drain: No growth x2 days  Goal of Therapy:  Vancomycin trough level 15-20 mcg/ml  Plan:    Continue Vancomycin 500mg  IV q12h.  Agree with Zosyn 3.375g IV Q8H infused over 4hrs.  Obtain vancomycin trough level today  Follow up renal fxn, culture results, duration of abx.   Lynann Beaver PharmD, BCPS Pager [phone] 01/06/2012 12:18 PM    Addendum  Vancomycin trough level = 13.5 mcg/ml on Vancomycin 500mg  IV q12h, which is below goal of 15-20 mcg/ml.  Patient received last dose of 500mg  @ 22:30.  Will increase dose to 750mg  IV q12h with next scheduled dose.  Terrilee Files, PharmD 01/06/12 23:39

## 2012-01-07 LAB — CULTURE, ROUTINE-ABSCESS: Gram Stain: NONE SEEN

## 2012-01-07 LAB — CBC
Hemoglobin: 10.5 g/dL — ABNORMAL LOW (ref 12.0–15.0)
MCH: 27.8 pg (ref 26.0–34.0)
MCHC: 33.9 g/dL (ref 30.0–36.0)
MCV: 82 fL (ref 78.0–100.0)
RBC: 3.78 MIL/uL — ABNORMAL LOW (ref 3.87–5.11)

## 2012-01-07 LAB — BASIC METABOLIC PANEL
BUN: 7 mg/dL (ref 6–23)
CO2: 23 mEq/L (ref 19–32)
Calcium: 9.1 mg/dL (ref 8.4–10.5)
Creatinine, Ser: 1.06 mg/dL (ref 0.50–1.10)
GFR calc non Af Amer: 62 mL/min — ABNORMAL LOW (ref >90)
Glucose, Bld: 166 mg/dL — ABNORMAL HIGH (ref 70–99)

## 2012-01-07 NOTE — Progress Notes (Signed)
Assisting patient to bed and patient refused to sleep with SCDs on. Patient was made aware of what they are used for. Patient stated she did not want to wear.

## 2012-01-07 NOTE — Progress Notes (Signed)
11 Days Post-Op  Subjective: Pt ok. Still nt eating great. Surgery has requested drain to come out  Objective: Vital signs in last 24 hours: Temp:  [98.1 F (36.7 C)-99 F (37.2 C)] 99 F (37.2 C) (04/14 0704) Pulse Rate:  [83-104] 83  (04/14 0704) Resp:  [18-20] 20  (04/14 0704) BP: (116-129)/(75-83) 129/83 mmHg (04/14 0704) SpO2:  [95 %-98 %] 97 % (04/14 0704) Last BM Date: 01/07/12  Intake/Output this shift: Total I/O In: 250 [P.O.:240; I.V.:10] Out: 300 [Urine:300]  Physical Exam: BP 129/83  Pulse 83  Temp(Src) 99 F (37.2 C) (Axillary)  Resp 20  Ht 4\' 9"  (1.448 m)  Wt 92 lb 9.5 oz (42 kg)  BMI 20.04 kg/m2  SpO2 97%  LMP 07/28/2011 Drain intact: scant serous output  Labs: CBC  Basename 01/07/12 0524  WBC 4.5  HGB 10.5*  HCT 31.0*  PLT 359   BMET No results found for this basename: NA:2,K:2,CL:2,CO2:2,GLUCOSE:2,BUN:2,CREATININE:2,CALCIUM:2 in the last 72 hours LFT No results found for this basename: PROT,ALBUMIN,AST,ALT,ALKPHOS,BILITOT,BILIDIR,IBILI,LIPASE in the last 72 hours PT/INR No results found for this basename: LABPROT:2,INR:2 in the last 72 hours ABG No results found for this basename: PHART:2,PCO2:2,PO2:2,HCO3:2 in the last 72 hours  Studies/Results: No results found.  Assessment/Plan: Subphrenic fluid collection s/p perc drain 4/10 Cx negative and minimal output. Drain removed, dressing applied. Signing off    LOS: 24 days    Brayton El PA-C 01/07/2012 9:51 AM

## 2012-01-07 NOTE — Progress Notes (Signed)
11 Days Post-Op  Subjective: Still does not have much appetite.  Objective: Vital signs in last 24 hours: Temp:  [98.1 F (36.7 C)-99 F (37.2 C)] 99 F (37.2 C) (04/14 0704) Pulse Rate:  [83-104] 83  (04/14 0704) Resp:  [18-20] 20  (04/14 0704) BP: (116-129)/(75-83) 129/83 mmHg (04/14 0704) SpO2:  [95 %-98 %] 97 % (04/14 0704) Last BM Date: 01/06/12  Intake/Output from previous day: 04/13 0701 - 04/14 0700 In: 1178 [I.V.:978; IV Piggyback:200] Out: 2200 [Urine:1550; Stool:650] Intake/Output this shift:    PE: Abd-soft, nontender, small amount of serous fluid in drain, ileostomy in RLQ with liquid stool  Lab Results:   [facility] 01/07/12 0524  WBC 4.5  HGB 10.5*  HCT 31.0*  PLT 359   BMET No results found for this basename: NA:2,K:2,CL:2,CO2:2,GLUCOSE:2,BUN:2,CREATININE:2,CALCIUM:2 in the last 72 hours PT/INR No results found for this basename: LABPROT:2,INR:2 in the last 72 hours Comprehensive Metabolic Panel:    Component Value Date/Time   NA 131* 01/02/2012 0350   K 4.1 01/02/2012 0350   CL 98 01/02/2012 0350   CO2 25 01/02/2012 0350   BUN <3* 01/02/2012 0350   CREATININE 0.56 01/04/2012 0354   CREATININE 0.70 07/27/2011 1306   GLUCOSE 141* 01/02/2012 0350   CALCIUM 8.8 01/02/2012 0350   AST 43* 01/01/2012 0400   ALT 38* 01/01/2012 0400   ALKPHOS 128* 01/01/2012 0400   BILITOT 0.4 01/01/2012 0400   PROT 6.1 01/01/2012 0400   ALBUMIN 2.5* 01/01/2012 0400   Abdominal fluid culture-no growth  Studies/Results: No results found.  Anti-infectives: Anti-infectives     Start     Dose/Rate Route Frequency Ordered Stop   01/07/12 1000   vancomycin (VANCOCIN) 750 mg in sodium chloride 0.9 % 150 mL IVPB        750 mg 150 mL/hr over 60 Minutes Intravenous Every 12 hours 01/06/12 2340     01/03/12 1000  piperacillin-tazobactam (ZOSYN) IVPB 3.375 g       3.375 g 12.5 mL/hr over 240 Minutes Intravenous Every 8 hours 01/03/12 0855     01/03/12 1000   vancomycin (VANCOCIN) 500 mg in  sodium chloride 0.9 % 100 mL IVPB  Status:  Discontinued        500 mg 100 mL/hr over 60 Minutes Intravenous Every 12 hours 01/03/12 0922 01/06/12 2340   12/29/11 0900   fluconazole (DIFLUCAN) IVPB 100 mg        100 mg 50 mL/hr over 60 Minutes Intravenous Every 24 hours 12/29/11 0803 12/31/11 1006   12/26/11 0800   fluconazole (DIFLUCAN) IVPB 100 mg  Status:  Discontinued        100 mg 50 mL/hr over 60 Minutes Intravenous Every 24 hours 12/25/11 1408 12/28/11 1204   12/25/11 1500   fluconazole (DIFLUCAN) IVPB 200 mg        200 mg 100 mL/hr over 60 Minutes Intravenous  Once 12/25/11 1408 12/25/11 1628   12/18/11 1830   piperacillin-tazobactam (ZOSYN) IVPB 3.375 g  Status:  Discontinued        3.375 g 12.5 mL/hr over 240 Minutes Intravenous 3 times per day 12/18/11 1744 12/23/11 0840   12/15/11 0800   ertapenem (INVANZ) 1 g in sodium chloride 0.9 % 50 mL IVPB        1 g 100 mL/hr over 30 Minutes Intravenous Every 24 hours 12/14/11 1537 12/15/11 0812   12/14/11 0527   ertapenem (INVANZ) 1 g in sodium chloride 0.9 % 50 mL IVPB  1 g 100 mL/hr over 30 Minutes Intravenous 60 min pre-op 12/14/11 0527 12/14/11 0805          Assessment Principal Problem:  *Rectal cancer s/p proctocolectomy, j-pouch with ileoanal anastomosis, loop ileostomy on 12/14/11-ileostomy output volume is not too high  Esophagitis  Intraabd abscess-clx is negative    LOS: 24 days   Plan: D/C abxs.  D/C drain.   Chesney Suares J 01/07/2012

## 2012-01-08 MED ORDER — SODIUM CHLORIDE 0.9 % IJ SOLN: 3.0000 mL | INTRAMUSCULAR | Status: DC | PRN

## 2012-01-08 MED ORDER — PANTOPRAZOLE SODIUM 40 MG PO TBEC
40.0000 mg | DELAYED_RELEASE_TABLET | Freq: Every day | ORAL | Status: DC
  Administered 2012-01-08 – 2012-01-09 (×2): 40 mg via ORAL
  Filled 2012-01-08 (×2): qty 1

## 2012-01-08 MED ORDER — ENSURE COMPLETE PO LIQD: 237.0000 mL | Freq: Three times a day (TID) | ORAL | Status: DC

## 2012-01-08 MED ORDER — POTASSIUM CHLORIDE CRYS ER 20 MEQ PO TBCR: 40.0000 meq | EXTENDED_RELEASE_TABLET | Freq: Two times a day (BID) | ORAL | Status: DC

## 2012-01-08 MED ORDER — ACETAMINOPHEN 325 MG PO TABS: 650.0000 mg | ORAL_TABLET | ORAL | Status: DC | PRN

## 2012-01-08 MED ORDER — ALUM & MAG HYDROXIDE-SIMETH 200-200-20 MG/5ML PO SUSP: 15.0000 mL | ORAL | Status: AC | PRN

## 2012-01-08 MED ORDER — ONDANSETRON HCL 4 MG PO TABS: 4.0000 mg | ORAL_TABLET | Freq: Four times a day (QID) | ORAL | Status: AC | PRN

## 2012-01-08 MED ORDER — SUCRALFATE 1 GM/10ML PO SUSP: 1.0000 g | Freq: Three times a day (TID) | ORAL | Status: DC

## 2012-01-08 MED ORDER — PANTOPRAZOLE SODIUM 40 MG PO TBEC: 40.0000 mg | DELAYED_RELEASE_TABLET | Freq: Every day | ORAL | Status: DC

## 2012-01-08 MED ORDER — MEGESTROL ACETATE 400 MG/10ML PO SUSP: 400.0000 mg | Freq: Two times a day (BID) | ORAL | Status: DC

## 2012-01-08 MED ORDER — LOPERAMIDE HCL 2 MG PO CAPS: 4.0000 mg | ORAL_CAPSULE | Freq: Four times a day (QID) | ORAL | Status: DC

## 2012-01-08 MED ORDER — HYDROCODONE-ACETAMINOPHEN 5-325 MG PO TABS: 0.5000 | ORAL_TABLET | Freq: Four times a day (QID) | ORAL | Status: AC | PRN

## 2012-01-08 NOTE — Discharge Instructions (Signed)
[facility]      [facility] [phone]  ABDOMINAL SURGERY: POST OP INSTRUCTIONS  Always review your discharge instruction sheet given to you by the facility where your surgery was performed.  IF YOU HAVE DISABILITY OR FAMILY LEAVE FORMS, YOU MUST BRING THEM TO THE OFFICE FOR PROCESSING.  PLEASE DO NOT GIVE THEM TO YOUR DOCTOR.  1. A prescription for pain medication may be given to you upon discharge.  Take your pain medication as prescribed, if needed.  If narcotic pain medicine is not needed, then you may take acetaminophen (Tylenol) or ibuprofen (Advil) as needed. 2. Take your usually prescribed medications unless otherwise directed. 3. If you need a refill on your pain medication, please contact your pharmacy. They will contact our office to request authorization.  Prescriptions will not be filled after 5pm or on week-ends. 4. You should follow a light diet the first few days after arrival home, such as soup and crackers, pudding, etc.unless your doctor has advised otherwise. A high-fiber, low fat diet can be resumed as tolerated.   Be sure to include lots of fluids daily. Most patients will experience some swelling and bruising on the chest and neck area.  Ice packs will help.  Swelling and bruising can take several days to resolve 5. Most patients will experience some swelling and bruising in the area of the incision. Ice pack will help. Swelling and bruising can take several days to resolve..  6. It is common to experience some constipation if taking pain medication after surgery.  Increasing fluid intake and taking a stool softener will usually help or prevent this problem from occurring.  A mild laxative (Milk of Magnesia or Miralax) should be taken according to package directions if there are no bowel movements after 48 hours. 7.  You may have steri-strips (small skin tapes) in place directly over the incision.  These strips should be left on the skin for 7-10 days.  If your  surgeon used skin glue on the incision, you may shower in 24 hours.  The glue will flake off over the next 2-3 weeks.  Any sutures or staples will be removed at the office during your follow-up visit. You may find that a light gauze bandage over your incision may keep your staples from being rubbed or pulled. You may shower and replace the bandage daily. 8. ACTIVITIES:  You may resume regular (light) daily activities beginning the next day--such as daily self-care, walking, climbing stairs--gradually increasing activities as tolerated.  You may have sexual intercourse when it is comfortable.  Refrain from any heavy lifting or straining until approved by your doctor. a. You may drive when you no longer are taking prescription pain medication, you can comfortably wear a seatbelt, and you can safely maneuver your car and apply brakes b. Return to Work: ___________________________________ 9. You should see your doctor in the office for a follow-up appointment approximately two weeks after your surgery.  Make sure that you call for this appointment within a day or two after you arrive home to insure a convenient appointment time. OTHER INSTRUCTIONS:  _____________________________________________________________ _____________________________________________________________  WHEN TO CALL YOUR DOCTOR: 1. Fever over 101.0 2. Inability to urinate 3. Nausea and/or vomiting 4. Extreme swelling or bruising 5. Continued bleeding from incision. 6. Increased pain, redness, or drainage from the incision. 7. Difficulty swallowing or breathing 8. Muscle cramping or spasms. 9. Numbness or tingling in hands or feet or around lips.  The clinic staff is available to answer   your questions during regular business hours.  Please don't hesitate to call and ask to speak to one of the nurses if you have concerns.  For further questions, please visit [email or web address]   

## 2012-01-08 NOTE — Progress Notes (Signed)
Patient ID: Renee Nicholson, female   DOB: 04/19/1966, 46 y.o.   MRN: [id number]   Subjective: Feeling better with drain out.  Eating pancakes  Objective: Vital signs in last 24 hours: Temp:  [98.1 F (36.7 C)-98.7 F (37.1 C)] 98.1 F (36.7 C) (04/15 0749) Pulse Rate:  [84-94] 84  (04/15 0749) Resp:  [16] 16  (04/15 0749) BP: (103-114)/(69-87) 103/87 mmHg (04/15 0749) SpO2:  [94 %-99 %] 99 % (04/15 0749) Weight:  [82 lb 3.7 oz (37.3 kg)] 82 lb 3.7 oz (37.3 kg) (04/15 0749) Last BM Date: 01/08/12  Intake/Output from previous day: 04/14 0701 - 04/15 0700 In: 1220 [P.O.:240; I.V.:980] Out: 825 [Urine:600; Stool:225] Intake/Output this shift:    General appearance: alert, no distress GI: normal findings: Abd soft, non distended, non tender.   Ostomy output pasty Lab Results:   [facility] 01/07/12 0524  WBC 4.5  HGB 10.5*  HCT 31.0*  PLT 359   BMET  Basename 01/07/12 1150  NA 135  K 4.1  CL 101  CO2 23  GLUCOSE 166*  BUN 7  CREATININE 1.06  CALCIUM 9.1     Studies/Results: No results found.  Anti-infectives: Anti-infectives     Start     Dose/Rate Route Frequency Ordered Stop   01/07/12 1000   vancomycin (VANCOCIN) 750 mg in sodium chloride 0.9 % 150 mL IVPB  Status:  Discontinued        750 mg 150 mL/hr over 60 Minutes Intravenous Every 12 hours 01/06/12 2340 01/07/12 0748   01/03/12 1000   piperacillin-tazobactam (ZOSYN) IVPB 3.375 g  Status:  Discontinued        3.375 g 12.5 mL/hr over 240 Minutes Intravenous Every 8 hours 01/03/12 0855 01/07/12 0748   01/03/12 1000   vancomycin (VANCOCIN) 500 mg in sodium chloride 0.9 % 100 mL IVPB  Status:  Discontinued        500 mg 100 mL/hr over 60 Minutes Intravenous Every 12 hours 01/03/12 0922 01/06/12 2340   12/29/11 0900   fluconazole (DIFLUCAN) IVPB 100 mg        100 mg 50 mL/hr over 60 Minutes Intravenous Every 24 hours 12/29/11 0803 12/31/11 1006   12/26/11 0800   fluconazole (DIFLUCAN) IVPB 100 mg   Status:  Discontinued        100 mg 50 mL/hr over 60 Minutes Intravenous Every 24 hours 12/25/11 1408 12/28/11 1204   12/25/11 1500   fluconazole (DIFLUCAN) IVPB 200 mg        200 mg 100 mL/hr over 60 Minutes Intravenous  Once 12/25/11 1408 12/25/11 1628   12/18/11 1830   piperacillin-tazobactam (ZOSYN) IVPB 3.375 g  Status:  Discontinued        3.375 g 12.5 mL/hr over 240 Minutes Intravenous 3 times per day 12/18/11 1744 12/23/11 0840   12/15/11 0800   ertapenem (INVANZ) 1 g in sodium chloride 0.9 % 50 mL IVPB        1 g 100 mL/hr over 30 Minutes Intravenous Every 24 hours 12/14/11 1537 12/15/11 0812   12/14/11 0527   ertapenem (INVANZ) 1 g in sodium chloride 0.9 % 50 mL IVPB        1 g 100 mL/hr over 30 Minutes Intravenous 60 min pre-op 12/14/11 0527 12/14/11 0805          Assessment/Plan: s/p Procedure(s): Lap TAC, IPAA, diverting ostomy Continue esophagitis tx Megace for appetite stimulation Imodium at max Plan home tomorrow with [facility] for new ostomy and  for IVF.  Marland Kitchen Will put in picc for IV fluids at home intermittently.      LOS: 25 days    Caretha Rumbaugh 01/08/2012

## 2012-01-09 ENCOUNTER — Telehealth (INDEPENDENT_AMBULATORY_CARE_PROVIDER_SITE_OTHER): Payer: Self-pay | Admitting: General Surgery

## 2012-01-09 LAB — CBC
MCH: 27.1 pg (ref 26.0–34.0)
MCHC: 32.5 g/dL (ref 30.0–36.0)
MCV: 83.4 fL (ref 78.0–100.0)
Platelets: 432 10*3/uL — ABNORMAL HIGH (ref 150–400)
RDW: 15.2 % (ref 11.5–15.5)
WBC: 7.5 10*3/uL (ref 4.0–10.5)

## 2012-01-09 LAB — COMPREHENSIVE METABOLIC PANEL
AST: 29 U/L (ref 0–37)
Albumin: 2.5 g/dL — ABNORMAL LOW (ref 3.5–5.2)
Alkaline Phosphatase: 116 U/L (ref 39–117)
BUN: 6 mg/dL (ref 6–23)
Chloride: 103 mEq/L (ref 96–112)
Potassium: 4.3 mEq/L (ref 3.5–5.1)
Total Bilirubin: 0.3 mg/dL (ref 0.3–1.2)

## 2012-01-09 MED ORDER — HEPARIN LOCK FLUSH 100 UNIT/ML IV SOLN: 100.0000 [IU] | INTRAVENOUS | Status: DC | PRN

## 2012-01-09 MED ORDER — HEPARIN SOD (PORK) LOCK FLUSH 100 UNIT/ML IV SOLN
500.0000 [IU] | Freq: Once | INTRAVENOUS | Status: AC
  Administered 2012-01-09: 500 [IU] via INTRAVENOUS

## 2012-01-09 MED ORDER — HEPARIN SOD (PORK) LOCK FLUSH 100 UNIT/ML IV SOLN
INTRAVENOUS | Status: AC
  Filled 2012-01-09: qty 5

## 2012-01-09 NOTE — Telephone Encounter (Signed)
Renee Nicholson with [facility], calling to obtain an IVF rate for 2 liters of NS 3x/week.  Dr. Donell Beers ordered it to be given as fast as possible for [facility] protocol.

## 2012-01-09 NOTE — Discharge Summary (Signed)
Physician Discharge Summary  Patient ID: Renee Nicholson MRN: [id number] DOB/AGE: 1966/01/31 46 y.o.  Admit date: 12/14/2011 Discharge date: 01/09/2012  Admission Diagnoses: Rectal Cancer, polyposis of colon Thyroid mass   Discharge Diagnoses:  Principal Problem:  *Rectal cancer s/p proctocolectomy, j-pouch with ileoanal anastomosis, loop ileostomy on 12/14/11 Esophagitis Malnutrition Anemia - chronic + acute blood loss anemia  Discharged Condition: good  Hospital Course:  Pt was admitted to the floor following her surgery.  She did well for around 48 hours, and then she developed abdominal distention and pain.  An NGT was placed, and she was transferred to the ICU for shortness of breath and tachycardia.  An xray demonstrated free air in the abdomen, but she was very recent post op.  She was placed on IV antibiotics, and a CT was ordered.  No abscesses were seen and there was no gas around the pouch.  She slowly began to improve and had bilious output from the ostomy.  Her NGT was pulled, and her diet was advanced.   She then developed severe dysphagia and chest pain.  GI was consulted and she was placed on empiric tx for candidal esophagitis.  She did not improve, and an endoscopy was performed to ensure that there were no other diagnoses.  The fungal stain was negative, and fluconazole was stopped.  She continued to have significant difficulty eating with poor appetite and early satiety, so a repeat CT was ordered.   This continued to show a significant amount of air, but it looked walled off at this point.  There was some fluid present, and a drain was placed in order to culture it.  This was negative and antibiotics and drain were discontinued.   Her appetite picked up, and she is discharged to home in improved condition.  Her ostomy output is intermittently high (>800), and she has been developing weakness with being off IVFluids for several days.  She will receive IVF via picc line at  home several times a week to avoid dehydration and readmission.  She is discharged with Ensure.  She has mostly only been taking tylenol for pain.  She is having minimal rectal drainage.    Consults: GI  Significant Diagnostic Studies: labs: HCT prior to d/c 28.6, Cr normal, Albumin 2.5, culture from abdomen negative,  radiology: CT scan: Walled off area of air and some fluid and endoscopy: Esophagitis  Treatments: IV hydration, antibiotics: vancomycin, Zosyn and Diflucan, procedures: EGD and perc drain placement and surgery: as above.    Discharge Exam: Blood pressure 125/80, pulse 86, temperature 98.2 F (36.8 C), temperature source Axillary, resp. rate 16, height 4\' 9"  (1.448 m), weight 82 lb 3.7 oz (37.3 kg), last menstrual period 07/28/2011, SpO2 98.00%. General appearance: alert, cooperative and no distress Chest wall: no tenderness GI: soft, non-tender; bowel sounds normal; no masses,  no organomegaly and ostomy pink with pasty stool Drain site OK  Disposition: 01-Home or Self Care  Discharge Orders    Future Appointments: Provider: Department: Dept Phone: Center:   02/06/2012 11:20 AM Maryln Gottron, MD [facility] [phone] None     Future Orders Please Complete By Expires   Ambulatory referral to Home Health      Comments:   Please evaluate Renee Nicholson for admission to [facility].  Disciplines requested: nursing  Services to provide: Double Lumen Central Venous Access, Wound/Ostomy Care and Other: 2 liters NS IV 3 times/week.  Physician to follow patient's care (the person listed here  will be responsible for signing ongoing orders): Referring Provider  Requested Start of Care Date: Tomorrow     Diet - low sodium heart healthy      Increase activity slowly      Discharge wound care:      Comments:   Remove drain dressing tomorrow and leave open.   Change dressing (specify)      Comments:   2 liters NS per Picc line 3 times weekly with [facility] and HHRN  teaching     Medication List  As of 01/09/2012  8:27 PM   STOP taking these medications         capecitabine 150 MG tablet      capecitabine 500 MG tablet      diphenoxylate-atropine 2.5-0.025 MG per tablet         TAKE these medications         acetaminophen 325 MG tablet   Commonly known as: TYLENOL   Take 2 tablets (650 mg total) by mouth every 4 (four) hours as needed.      alum & mag hydroxide-simeth 200-200-20 MG/5ML suspension   Commonly known as: MAALOX/MYLANTA   Take 15 mLs by mouth every 4 (four) hours as needed.      CALCIUM 500 +D 500-400 MG-UNIT Tabs   Generic drug: Calcium Carb-Cholecalciferol   Take 1 tablet by mouth every morning.      feeding supplement Liqd   Take 237 mLs by mouth 3 (three) times daily between meals.      heparin flush (porcine) 100 UNIT/ML injection   1 mL (100 Units total) by Intracatheter route as needed (250 units per lumen).      HYDROcodone-acetaminophen 5-325 MG per tablet   Commonly known as: NORCO   Take 0.5-2 tablets by mouth every 6 (six) hours as needed.      loperamide 2 MG capsule   Commonly known as: IMODIUM   Take 2 capsules (4 mg total) by mouth 4 (four) times daily.      megestrol 400 MG/10ML suspension   Commonly known as: MEGACE   Take 10 mLs (400 mg total) by mouth 2 (two) times daily.      multivitamin tablet   Take 1 tablet by mouth every morning.      ondansetron 4 MG tablet   Commonly known as: ZOFRAN   Take 1 tablet (4 mg total) by mouth every 6 (six) hours as needed for nausea.      oxyCODONE-acetaminophen 5-325 MG per tablet   Commonly known as: PERCOCET   Take 1 tablet by mouth every 4 (four) hours as needed. For pain      pantoprazole 40 MG tablet   Commonly known as: PROTONIX   Take 1 tablet (40 mg total) by mouth daily at 12 noon.      polysaccharide iron 150 MG Caps capsule   Commonly known as: NIFEREX   Take 150 mg by mouth every morning.      potassium chloride SA 20 MEQ tablet    Commonly known as: K-DUR,KLOR-CON   Take 2 tablets (40 mEq total) by mouth 2 (two) times daily.      simethicone 80 MG chewable tablet   Commonly known as: MYLICON   Chew 80 mg by mouth every 6 (six) hours as needed. For gas      sucralfate 1 GM/10ML suspension   Commonly known as: CARAFATE   Take 10 mLs (1 g total) by mouth 4 (four) times daily -  with meals and at bedtime.           Follow-up Information    Follow up with [facility], [place]. Schedule an appointment as soon as possible for a visit in 2 weeks.   Contact information:   [employer] [facility] [address] [place] [address] [phone]          Signed: [name] 01/09/2012, 8:27 PM

## 2012-01-09 NOTE — Consult Note (Signed)
WOC ostomy consult  Stoma type/location: RLQ Ileostomy Stomal assessment/size: 1 and 3/8 inch round loop stoma Peristomal assessment: clear, intact Treatment options for stomal/peristomal skin: none indicated Output small amount light brown stool Ostomy pouching: 1pc convex pouch  Education provided: Patient ready for discharge today.  HHRN to begin tomorrow. Patient and family have my contact information should they require consultation for problems. Blane Ohara Fontaine Kossman, MSN, RN, [facility], CWOCN [phone])

## 2012-01-11 ENCOUNTER — Other Ambulatory Visit: Payer: Self-pay | Admitting: Oncology

## 2012-01-11 ENCOUNTER — Telehealth (INDEPENDENT_AMBULATORY_CARE_PROVIDER_SITE_OTHER): Payer: Self-pay | Admitting: General Surgery

## 2012-01-11 ENCOUNTER — Encounter: Payer: Self-pay | Admitting: *Deleted

## 2012-01-11 DIAGNOSIS — Z8673 Personal history of transient ischemic attack (TIA), and cerebral infarction without residual deficits: Secondary | ICD-10-CM

## 2012-01-12 ENCOUNTER — Encounter: Payer: Self-pay | Admitting: *Deleted

## 2012-01-16 ENCOUNTER — Encounter (INDEPENDENT_AMBULATORY_CARE_PROVIDER_SITE_OTHER): Payer: BC Managed Care – PPO | Admitting: General Surgery

## 2012-01-19 ENCOUNTER — Telehealth (INDEPENDENT_AMBULATORY_CARE_PROVIDER_SITE_OTHER): Payer: Self-pay

## 2012-01-19 NOTE — Telephone Encounter (Signed)
Calling in asking if pt should get her IV drip on Monday that is from 9am to 6pm or keep her appt on Monday with Dr Donell Beers at 4pm. I asked Cyndra Numbers and she said the pt needs to keep her appt with Dr Donell Beers. The pt understands.

## 2012-01-22 ENCOUNTER — Encounter (INDEPENDENT_AMBULATORY_CARE_PROVIDER_SITE_OTHER): Payer: Self-pay | Admitting: General Surgery

## 2012-01-22 ENCOUNTER — Ambulatory Visit (INDEPENDENT_AMBULATORY_CARE_PROVIDER_SITE_OTHER): Payer: BC Managed Care – PPO | Admitting: General Surgery

## 2012-01-22 VITALS — BP 126/75 | HR 74 | Temp 98.2°F | Ht <= 58 in | Wt 91.2 lb

## 2012-01-22 DIAGNOSIS — E860 Dehydration: Secondary | ICD-10-CM

## 2012-01-22 DIAGNOSIS — R198 Other specified symptoms and signs involving the digestive system and abdomen: Secondary | ICD-10-CM | POA: Insufficient documentation

## 2012-01-22 DIAGNOSIS — C20 Malignant neoplasm of rectum: Secondary | ICD-10-CM

## 2012-01-22 NOTE — Assessment & Plan Note (Addendum)
Output is coming down. Will try stopping IV Fluids Will check labs next week to make sure dehydration is not recurring.

## 2012-01-22 NOTE — Progress Notes (Signed)
HISTORY: Pt is doing well after lap TAC/IPAA/ileal pouch anal anastamosis.      EXAM: General:  Looks more energetic and less poor. Incision:  Abd soft, non tender, non distended.  Ostomy more pasty.    ASSESSMENT AND PLAN:   High output ileostomy Output is coming down. Will try stopping IV Fluids Will check labs next week to make sure dehydration is not recurring.    Rectal cancer s/p proctocolectomy, j-pouch with ileoanal anastomosis, loop ileostomy on 12/14/11 Doing much better. Eating well.  Will recheck labs next week.         Renee Nicholson Renee Raymond Bhardwaj, MD Surgical Oncology, General & Endocrine Surgery [facility]  VYAS,DHRUV B., MD, MD Vyas, Dhruv B., MD    

## 2012-01-22 NOTE — Assessment & Plan Note (Signed)
Doing much better. Eating well.  Will recheck labs next week.

## 2012-01-22 NOTE — Patient Instructions (Signed)
Get IV Fluids Tuesday, Wednesday, and Friday of this week.  Then stop.  We will order labs next Wednesday.  They will hopefully be back on Thursday.  Hopefully we will get PICC out next Friday.

## 2012-01-23 ENCOUNTER — Telehealth (INDEPENDENT_AMBULATORY_CARE_PROVIDER_SITE_OTHER): Payer: Self-pay

## 2012-01-23 NOTE — Telephone Encounter (Signed)
Spoke with Mickle Mallory, Nursing Manager at [facility], regarding verbal orders for patient.  She will need IV fluids today, tomorrow and Friday, with CBC, CMET and Prealbumin to be drawn next Wednesday, 5/8, all per Dr. Donell Beers.  May be able to pull PICC following Friday if labs are okay.

## 2012-01-23 NOTE — Telephone Encounter (Signed)
Called to cancel RN for home IV fluids.  The pt has someone doing that in the home.  Pt will still need her labs drawn on 01/31/12.

## 2012-01-31 ENCOUNTER — Telehealth (INDEPENDENT_AMBULATORY_CARE_PROVIDER_SITE_OTHER): Payer: Self-pay | Admitting: General Surgery

## 2012-01-31 ENCOUNTER — Telehealth: Payer: Self-pay | Admitting: Medical Oncology

## 2012-01-31 DIAGNOSIS — C20 Malignant neoplasm of rectum: Secondary | ICD-10-CM

## 2012-01-31 NOTE — Telephone Encounter (Signed)
Received call from Faye Ramsay at [facility] who requested a diagnosis for test Dr. Donell Beers requested. She advised that the patient is having clear/light fluid draining from rectum during her sleep, more frequently than at LOV on 01/22/12. No sign of infection. Kim's call back number is [phone]. Advised information would be forwarded to [facility] and Dr. Donell Beers as an fyi.

## 2012-01-31 NOTE — Telephone Encounter (Signed)
Schedulers notified this RN that per patient "she is ready to start chemotherapy and had her surgery in March."  Will review with MD.  Rectal cancer s/p proctocolectomy, j-pouch with ileoanal anastomosis, loop ileostomy on 12/14/11

## 2012-01-31 NOTE — Telephone Encounter (Signed)
Let's see her back on 02/06/12

## 2012-02-01 ENCOUNTER — Telehealth: Payer: Self-pay | Admitting: Oncology

## 2012-02-01 ENCOUNTER — Encounter: Payer: Self-pay | Admitting: Radiation Oncology

## 2012-02-01 DIAGNOSIS — Z923 Personal history of irradiation: Secondary | ICD-10-CM | POA: Insufficient documentation

## 2012-02-01 NOTE — Telephone Encounter (Signed)
S/w the pt's husband who called in stating that the pt has had her surgery in march with dr Donell Beers and now she is ready for an appt and to start tx. Relayed this message to the desk nurse who will make me aware of when to bring the pt in for an appt

## 2012-02-01 NOTE — Telephone Encounter (Signed)
S/w the pt and she is aware of her may 14th appts

## 2012-02-02 ENCOUNTER — Telehealth (INDEPENDENT_AMBULATORY_CARE_PROVIDER_SITE_OTHER): Payer: Self-pay | Admitting: General Surgery

## 2012-02-02 NOTE — Telephone Encounter (Signed)
Nash Dimmer, [facility], with orders from Dr. Donell Beers to remove the [facility] line today.

## 2012-02-02 NOTE — Telephone Encounter (Signed)
Nurse calling to report labs were drawn yesterday (CBC, CMET) and she is going out today to care for [facility] line.  Kim needs orders to either pull the [facility] line or new orders to continue the care of it.  Please call her at [phone] to let her know how to proceed today.

## 2012-02-06 ENCOUNTER — Other Ambulatory Visit: Payer: BC Managed Care – PPO | Admitting: Lab

## 2012-02-06 ENCOUNTER — Ambulatory Visit (HOSPITAL_BASED_OUTPATIENT_CLINIC_OR_DEPARTMENT_OTHER): Payer: BC Managed Care – PPO | Admitting: Oncology

## 2012-02-06 ENCOUNTER — Encounter: Payer: Self-pay | Admitting: Radiation Oncology

## 2012-02-06 ENCOUNTER — Telehealth: Payer: Self-pay | Admitting: Oncology

## 2012-02-06 ENCOUNTER — Encounter: Payer: Self-pay | Admitting: Oncology

## 2012-02-06 ENCOUNTER — Ambulatory Visit
Admission: RE | Admit: 2012-02-06 | Discharge: 2012-02-06 | Disposition: A | Discharge status: Home / Self Care | Payer: BC Managed Care – PPO | Source: Ambulatory Visit | Attending: Radiation Oncology | Admitting: Radiation Oncology

## 2012-02-06 VITALS — BP 115/74 | HR 77 | Temp 97.0°F | Resp 20 | Wt 90.5 lb

## 2012-02-06 VITALS — BP 131/75 | HR 65 | Temp 98.4°F | Ht <= 58 in | Wt 90.6 lb

## 2012-02-06 DIAGNOSIS — C20 Malignant neoplasm of rectum: Secondary | ICD-10-CM

## 2012-02-06 LAB — COMPREHENSIVE METABOLIC PANEL
AST: 20 U/L (ref 0–37)
BUN: 15 mg/dL (ref 6–23)
Calcium: 9.4 mg/dL (ref 8.4–10.5)
Chloride: 106 mEq/L (ref 96–112)
Creatinine, Ser: 0.77 mg/dL (ref 0.50–1.10)
Total Bilirubin: 0.5 mg/dL (ref 0.3–1.2)

## 2012-02-06 LAB — CBC WITH DIFFERENTIAL/PLATELET
BASO%: 0.7 % (ref 0.0–2.0)
EOS%: 10.7 % — ABNORMAL HIGH (ref 0.0–7.0)
MCH: 29.6 pg (ref 25.1–34.0)
MCHC: 34.1 g/dL (ref 31.5–36.0)
RBC: 3.74 10*6/uL (ref 3.70–5.45)
RDW: 18.9 % — ABNORMAL HIGH (ref 11.2–14.5)
lymph#: 0.8 10*3/uL — ABNORMAL LOW (ref 0.9–3.3)

## 2012-02-06 MED ORDER — CAPECITABINE 500 MG PO TABS: 1000.0000 mg/m2 | ORAL_TABLET | Freq: Two times a day (BID) | ORAL | Status: DC

## 2012-02-06 MED ORDER — DEXAMETHASONE 4 MG PO TABS: ORAL_TABLET | ORAL | Status: DC

## 2012-02-06 MED ORDER — PROCHLORPERAZINE 25 MG RE SUPP: 25.0000 mg | Freq: Two times a day (BID) | RECTAL | Status: DC | PRN

## 2012-02-06 MED ORDER — LORAZEPAM 0.5 MG PO TABS: 0.5000 mg | ORAL_TABLET | Freq: Four times a day (QID) | ORAL | Status: DC | PRN

## 2012-02-06 MED ORDER — PROCHLORPERAZINE MALEATE 10 MG PO TABS: 10.0000 mg | ORAL_TABLET | Freq: Four times a day (QID) | ORAL | Status: DC | PRN

## 2012-02-06 MED ORDER — ONDANSETRON HCL 8 MG PO TABS: ORAL_TABLET | ORAL | Status: DC

## 2012-02-06 NOTE — Patient Instructions (Signed)
1. Port placement by Dr. Donell Beers as soon as possible  2. I will see you back on 5/28 with anticipation of giving you chemotherapy on that day.   Chemotherapy planned:  Oxaliplatin Injection What is this medicine? OXALIPLATIN (ox AL i PLA tin) is a chemotherapy drug. It targets fast dividing cells, like cancer cells, and causes these cells to die. This medicine is used to treat cancers of the colon and rectum, and many other cancers. This medicine may be used for other purposes; ask your health care provider or pharmacist if you have questions. What should I tell my health care provider before I take this medicine? They need to know if you have any of these conditions: -kidney disease -an unusual or allergic reaction to oxaliplatin, other chemotherapy, other medicines, foods, dyes, or preservatives -pregnant or trying to get pregnant -breast-feeding How should I use this medicine? This drug is given as an infusion into a vein. It is administered in a hospital or clinic by a specially trained health care professional. Talk to your pediatrician regarding the use of this medicine in children. Special care may be needed. Overdosage: If you think you have taken too much of this medicine contact a poison control center or emergency room at once. NOTE: This medicine is only for you. Do not share this medicine with others. What if I miss a dose? It is important not to miss a dose. Call your doctor or health care professional if you are unable to keep an appointment. What may interact with this medicine? -medicines to increase blood counts like filgrastim, pegfilgrastim, sargramostim -probenecid -some antibiotics like amikacin, gentamicin, neomycin, polymyxin B, streptomycin, tobramycin -zalcitabine Talk to your doctor or health care professional before taking any of these medicines: -acetaminophen -aspirin -ibuprofen -ketoprofen -naproxen This list may not describe all possible interactions.  Give your health care provider a list of all the medicines, herbs, non-prescription drugs, or dietary supplements you use. Also tell them if you smoke, drink alcohol, or use illegal drugs. Some items may interact with your medicine. What should I watch for while using this medicine? Your condition will be monitored carefully while you are receiving this medicine. You will need important blood work done while you are taking this medicine. This medicine can make you more sensitive to cold. Do not drink cold drinks or use ice. Cover exposed skin before coming in contact with cold temperatures or cold objects. When out in cold weather wear warm clothing and cover your mouth and nose to warm the air that goes into your lungs. Tell your doctor if you get sensitive to the cold. This drug may make you feel generally unwell. This is not uncommon, as chemotherapy can affect healthy cells as well as cancer cells. Report any side effects. Continue your course of treatment even though you feel ill unless your doctor tells you to stop. In some cases, you may be given additional medicines to help with side effects. Follow all directions for their use. Call your doctor or health care professional for advice if you get a fever, chills or sore throat, or other symptoms of a cold or flu. Do not treat yourself. This drug decreases your body's ability to fight infections. Try to avoid being around people who are sick. This medicine may increase your risk to bruise or bleed. Call your doctor or health care professional if you notice any unusual bleeding. Be careful brushing and flossing your teeth or using a toothpick because you may get an  infection or bleed more easily. If you have any dental work done, tell your dentist you are receiving this medicine. Avoid taking products that contain aspirin, acetaminophen, ibuprofen, naproxen, or ketoprofen unless instructed by your doctor. These medicines may hide a fever. Do not become  pregnant while taking this medicine. Women should inform their doctor if they wish to become pregnant or think they might be pregnant. There is a potential for serious side effects to an unborn child. Talk to your health care professional or pharmacist for more information. Do not breast-feed an infant while taking this medicine. Call your doctor or health care professional if you get diarrhea. Do not treat yourself. What side effects may I notice from receiving this medicine? Side effects that you should report to your doctor or health care professional as soon as possible: -allergic reactions like skin rash, itching or hives, swelling of the face, lips, or tongue -low blood counts - This drug may decrease the number of white blood cells, red blood cells and platelets. You may be at increased risk for infections and bleeding. -signs of infection - fever or chills, cough, sore throat, pain or difficulty passing urine -signs of decreased platelets or bleeding - bruising, pinpoint red spots on the skin, black, tarry stools, nosebleeds -signs of decreased red blood cells - unusually weak or tired, fainting spells, lightheadedness -breathing problems -chest pain, pressure -cough -diarrhea -jaw tightness -mouth sores -nausea and vomiting -pain, swelling, redness or irritation at the injection site -pain, tingling, numbness in the hands or feet -problems with balance, talking, walking -redness, blistering, peeling or loosening of the skin, including inside the mouth -trouble passing urine or change in the amount of urine Side effects that usually do not require medical attention (report to your doctor or health care professional if they continue or are bothersome): -changes in vision -constipation -hair loss -loss of appetite -metallic taste in the mouth or changes in taste -stomach pain This list may not describe all possible side effects. Call your doctor for medical advice about side  effects. You may report side effects to FDA at 1-800-FDA-1088. Where should I keep my medicine? This drug is given in a hospital or clinic and will not be stored at home. NOTE: This sheet is a summary. It may not cover all possible information. If you have questions about this medicine, talk to your doctor, pharmacist, or health care provider.  2012, Elsevier/Gold Standard. (04/07/2008 5:22:47 PM)  Capecitabine tablets What is this medicine? CAPECITABINE (ka pe SITE a been) is a chemotherapy drug. It slows the growth of cancer cells. This medicine is used to treat breast cancer, and also colon or rectal cancer. This medicine may be used for other purposes; ask your health care provider or pharmacist if you have questions. What should I tell my health care provider before I take this medicine? They need to know if you have any of these conditions: -bleeding or blood disorders -dihydropyrimidine dehydrogenase (DPD) deficiency -heart disease -infection (especially a virus infection such as chickenpox, cold sores, or herpes) -kidney disease -liver disease -an unusual or allergic reaction to capecitabine, 5-fluorouracil, other medicines, foods, dyes, or preservatives -pregnant or trying to get pregnant -breast-feeding How should I use this medicine? Take this medicine by mouth with a glass of water, within 30 minutes of the end of a meal. Follow the directions on the prescription label. Take your medicine at regular intervals. Do not take it more often than directed. Do not stop taking except on  your doctor's advice. Your doctor may want you to take a combination of 150 mg and 500 mg tablets for each dose. It is very important that you know how to correctly take your dose. Taking the wrong tablets could result in an overdose (too much medication) or underdose (too little medication). Talk to your pediatrician regarding the use of this medicine in children. Special care may be needed. Overdosage:  If you think you have taken too much of this medicine contact a poison control center or emergency room at once. NOTE: This medicine is only for you. Do not share this medicine with others. What if I miss a dose? If you miss a dose, do not take the missed dose at all. Do not take double or extra doses. Instead, continue with your next scheduled dose and check with your doctor. What may interact with this medicine? -antacids with aluminum and/or magnesium -folic acid -leucovorin -medicines to increase blood counts like filgrastim, pegfilgrastim, sargramostim -phenytoin -vaccines -warfarin Talk to your doctor or health care professional before taking any of these medicines: -acetaminophen -aspirin -ibuprofen -ketoprofen -naproxen This list may not describe all possible interactions. Give your health care provider a list of all the medicines, herbs, non-prescription drugs, or dietary supplements you use. Also tell them if you smoke, drink alcohol, or use illegal drugs. Some items may interact with your medicine. What should I watch for while using this medicine? Visit your doctor for checks on your progress. This drug may make you feel generally unwell. This is not uncommon, as chemotherapy can affect healthy cells as well as cancer cells. Report any side effects. Continue your course of treatment even though you feel ill unless your doctor tells you to stop. In some cases, you may be given additional medicines to help with side effects. Follow all directions for their use. Call your doctor or health care professional for advice if you get a fever, chills or sore throat, or other symptoms of a cold or flu. Do not treat yourself. This drug decreases your body's ability to fight infections. Try to avoid being around people who are sick. This medicine may increase your risk to bruise or bleed. Call your doctor or health care professional if you notice any unusual bleeding. Be careful brushing and  flossing your teeth or using a toothpick because you may get an infection or bleed more easily. If you have any dental work done, tell your dentist you are receiving this medicine. Avoid taking products that contain aspirin, acetaminophen, ibuprofen, naproxen, or ketoprofen unless instructed by your doctor. These medicines may hide a fever. Do not become pregnant while taking this medicine. Women should inform their doctor if they wish to become pregnant or think they might be pregnant. There is a potential for serious side effects to an unborn child. Talk to your health care professional or pharmacist for more information. Do not breast-feed an infant while taking this medicine. Men are advised not to father a child while taking this medicine. What side effects may I notice from receiving this medicine? Side effects that you should report to your doctor or health care professional as soon as possible: -allergic reactions like skin rash, itching or hives, swelling of the face, lips, or tongue -low blood counts - this medicine may decrease the number of white blood cells, red blood cells and platelets. You may be at increased risk for infections and bleeding. -signs of infection - fever or chills, cough, sore throat, pain or difficulty passing  urine -signs of decreased platelets or bleeding - bruising, pinpoint red spots on the skin, black, tarry stools, blood in the urine -signs of decreased red blood cells - unusually weak or tired, fainting spells, lightheadedness -breathing problems -changes in vision -chest pain -diarrhea of more than 4 bowel movements in one day or any diarrhea at night -mouth sores -nausea and vomiting -pain, swelling, redness at site where injected -pain, tingling, numbness in the hands or feet -redness, swelling, or sores on hands or feet -stomach pain -vomiting -yellow color of skin or eyes Side effects that usually do not require medical attention (report to your  doctor or health care professional if they continue or are bothersome): -constipation -diarrhea -dry or itchy skin -hair loss -loss of appetite -nausea -weak or tired This list may not describe all possible side effects. Call your doctor for medical advice about side effects. You may report side effects to FDA at 1-800-FDA-1088. Where should I keep my medicine? Keep out of the reach of children. Store at room temperature between 15 and 30 degrees C (59 and 86 degrees F). Keep container tightly closed. Throw away any unused medicine after the expiration date. NOTE: This sheet is a summary. It may not cover all possible information. If you have questions about this medicine, talk to your doctor, pharmacist, or health care provider.  2012, Elsevier/Gold Standard. (08/24/2008 11:47:41 AM)

## 2012-02-06 NOTE — Telephone Encounter (Signed)
gve the pt her may 2013 appt calendar. Pt is aware that her chemo appts will be added. Sent michelle a staff message to add those appts

## 2012-02-06 NOTE — Progress Notes (Signed)
Pt denies pain, loss of appetite, does c/o fatigue. Drinks Boost, 2 cans/day plus meals.   Pt had surgery 12/14/11, now has ostomy pouch. She states she has no problems w/ostomy.  Pt cont to see dietician in cancer center.  Has appt w/Dr Welton Flakes today.

## 2012-02-06 NOTE — Progress Notes (Signed)
Faxed xeloda prescription to [facility].

## 2012-02-06 NOTE — Progress Notes (Signed)
Followup note:  Renee Nicholson returns today approximately almost 2 months following her laparoscopic total proctocolectomy with ileal pouch anal anastomosis and diverting ileostomy. Her pathology report showed no evidence for residual carcinoma. She is doing quite well and her ileostomy is functioning well. She saw Dr. Donell Beers 2 weeks ago. She'll see Dr. Welton Flakes later today.  Physical examination: Alert and oriented. She appears well. Vital signs: Wt Readings from Last 3 Encounters:  02/06/12 90 lb 8 oz (41.051 kg)  01/22/12 91 lb 3.2 oz (41.368 kg)  01/08/12 82 lb 3.7 oz (37.3 kg)   Temp Readings from Last 3 Encounters:  02/06/12 97 F (36.1 C) Oral  01/22/12 98.2 F (36.8 C) Temporal  01/09/12 98.2 F (36.8 C) Axillary   BP Readings from Last 3 Encounters:  02/06/12 115/74  01/22/12 126/75  01/09/12 125/80   Pulse Readings from Last 3 Encounters:  02/06/12 77  01/22/12 74  01/09/12 86   Head and neck examination: Grossly unremarkable. Abdomen: Upper abdominal ileostomy which is functioning well. Wounds are well-healed. Rectal: There appears to be a good sphincter tone, but I was able to palpate the anastomosis secondary to pain on digital exam at the distal anus.  Impression: Satisfactory progress with a favorable complete response to her chemoradiation. She'll see Dr. Welton Flakes regarding a possible further adjuvant chemotherapy and she will maintain her followup with Dr. Donell Beers as well.  15 minutes was spent face-to-face with the patient, primarily counseling the patient through her interpreter.

## 2012-02-06 NOTE — Progress Notes (Signed)
OFFICE PROGRESS NOTE  CC: Renee Eva, MD Renee Herald, MD [name], MD [name], MD, MD [address] 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  PRIOR THERAPY:   #1 patient presented with a several month history of diarrhea and then subsequent bleeding. She was found to be anemic with a hemoglobin of 10. Because of this she went on to have a colonoscopy performed. The colonoscopy showed multiple sessile polyps in the cecum ascending transverse and sigmoid colon as well as the rectum. The rectal area showed a large exophytic rectal mass approximately 1 cm above the dentate line measuring 3-4 cm. She had a biopsy of this performed and was found to be an adenocarcinoma consistent with a rectal primary.  #2 patient was seen by Dr. Carlynn Nicholson at [facility] who performed a rectal ultrasound. The staging clinically was T3 N1. It was recommended by Dr. [name] the patient undergo neoadjuvant chemotherapy and radiation higher to her definitive surgery.  #3 Patient has completed concurrent radiation and chemotherapy. Her chemotherapy consisted of single agent xeloda administrated 08/22/11 - 10/09/2011. Overall she tolerated her treatment very well  #4 Has completed all of neoadjuvant treatment And the PET scan shows no evidence of distant metastasis and a para rectal peritoneal nodes less impressive she has had significant response to therapy.  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 APC is negative  #6 patient had a laparotomy with proctocolectomy, J-pouch with ileoanal anastomosis, loop ileostomy on 12/14/2011. The final pathology did not reveal any evidence of residual disease. Patient  had a complicated postop course including high output ileostomy. She required intensive IV fluids on an outpatient basis to prevent dehydration.  CURRENT THERAPY: to begin adjuvant chemotherapy consisting of XELOX  INTERVAL HISTORY: Renee Nicholson 45 y.o. female returns for followup visit today.this is her first visit since having had her surgery performed. Clinically patient seems to be doing well she is beginning to eat better. She still has significant output from her ileostomy. But she does tell me that things are getting better she does have diarrheal stool. She is denying any nausea vomiting she has no fevers chills night sweats she is eating and drinking a lot better. She has not noticed any bleeding. She does not have any abdominal pain of gas. Her stools are a little bit more pasty. Remainder of the 10 point review of systems is negative. Today she was accompanied by her husband her son and a family friend who is the Nurse, learning disability.  MEDICAL HISTORY: Past Medical History  Diagnosis Date  . Anemia   . Diarrhea   . Rectal cancer 08/16/2011  . Colon cancer   . Anxiety   . Bright red rectal bleeding 09/13/2011  . History of chemotherapy     completed 09/2011   . Hx of radiation therapy 08/29/11 to 10/09/11    rectum    ALLERGIES:   has no known allergies.  MEDICATIONS:  Current Outpatient Prescriptions  Medication Sig Dispense Refill  . acetaminophen (TYLENOL) 325 MG tablet Take 2 tablets (650 mg total) by mouth every 4 (four) hours as needed.  200 tablet  1  . Calcium Carb-Cholecalciferol (CALCIUM 500 +D) 500-400 MG-UNIT TABS Take 1 tablet by  mouth every morning.       . feeding supplement (ENSURE COMPLETE) LIQD Take 237 mLs by mouth 3 (three) times daily between meals.  50 Bottle  6  . HYDROcodone-acetaminophen (NORCO) 5-325 MG per tablet Take 1 tablet by mouth every 6 (six) hours as needed.      . loperamide (IMODIUM) 2 MG capsule Take 2 mg by mouth 4 (four) times daily as needed.        . megestrol (MEGACE) 400 MG/10ML suspension Take 10 mLs (400 mg total) by mouth 2 (two) times daily.  240 mL  1  . Multiple Vitamin (MULTIVITAMIN) tablet Take 1 tablet by mouth every morning.       . ondansetron (ZOFRAN) 4 MG tablet Take 4 mg by mouth every 8 (eight) hours as needed.      . pantoprazole (PROTONIX) 40 MG tablet Take 1 tablet (40 mg total) by mouth daily at 12 noon.  30 tablet  2  . polysaccharide iron (NIFEREX) 150 MG CAPS capsule Take 150 mg by mouth every morning.       . potassium chloride (K-DUR,KLOR-CON) 20 MEQ tablet Take 2 tablets (40 mEq total) by mouth 2 (two) times daily.  60 tablet  1  . simethicone (MYLICON) 80 MG chewable tablet Chew 80 mg by mouth every 6 (six) hours as needed. For gas      . sucralfate (CARAFATE) 1 GM/10ML suspension Take 10 mLs (1 g total) by mouth 4 (four) times daily -  with meals and at bedtime.  420 mL  1  . capecitabine (XELODA) 500 MG tablet Take 3 tablets (1,500 mg total) by mouth 2 (two) times daily after a meal.  84 tablet  6  . dexamethasone (DECADRON) 4 MG tablet Take 2 tablets by mouth daily starting the day after chemotherapy for 2 days. Take with food.  30 tablet  1  . Heparin Lock Flush (HEPARIN FLUSH, PORCINE,) 100 UNIT/ML injection 1 mL (100 Units total) by Intracatheter route as needed (250 units per lumen).  5 Syringe  3  . LORazepam (ATIVAN) 0.5 MG tablet Take 1 tablet (0.5 mg total) by mouth every 6 (six) hours as needed (Nausea or vomiting).  30 tablet  0  . ondansetron (ZOFRAN) 8 MG tablet Take 1 tablet two times a day starting the day after chemo for 2 days. Then take 1 tablet two times a day as needed for nausea or vomiting.  30 tablet  1  . prochlorperazine (COMPAZINE) 10 MG tablet Take 1 tablet (10 mg total) by mouth every 6 (six) hours as needed (Nausea or vomiting).  30 tablet  1  . prochlorperazine (COMPAZINE) 25 MG suppository Place 1 suppository (25 mg total) rectally every 12 (twelve) hours as needed for nausea.  12  suppository  3    SURGICAL HISTORY:  Past Surgical History  Procedure Date  . Tubal ligation   . Carpal tunnel release   . Colonoscopy 07/26/2011    Procedure: COLONOSCOPY;  Surgeon: Arlyce Harman, MD;  Location: [facility];  Service: Endoscopy;  Laterality: N/A;  10:40  . Esophagogastroduodenoscopy 07/26/2011    Procedure: ESOPHAGOGASTRODUODENOSCOPY (EGD);  Surgeon: Arlyce Harman, MD;  Location: [facility];  Service: Endoscopy;  Laterality: N/A;  . Esophagogastroduodenoscopy 12/27/2011    Procedure: ESOPHAGOGASTRODUODENOSCOPY (EGD);  Surgeon: Shirley Friar, MD;  Location: [facility];  Service: Endoscopy;  Laterality: N/A;  . Laparoscopic colon resection 12/14/11    diverting ileostomy    REVIEW  OF SYSTEMS:  Pertinent items are noted in HPI.   PHYSICAL EXAMINATION: General appearance: alert, cooperative, appears stated age, mild distress and pale Resp: clear to auscultation bilaterally and normal percussion bilaterally Cardio: regular rate and rhythm, S1, S2 normal, no murmur, click, rub or gallop GI: soft, non-tender; bowel sounds normal; no masses,  no organomegaly Extremities: extremities normal, atraumatic, no cyanosis or edema Neurologic: Alert and oriented X 3, normal strength and tone. Normal symmetric reflexes. Normal coordination and gait Rectal Exam: patient does not have any active bleeding, there is noted to be external hemorrhoids.  ECOG PERFORMANCE STATUS: 0 - Asymptomatic  Blood pressure 131/75, pulse 65, temperature 98.4 F (36.9 C), temperature source Oral, height 4\' 9"  (1.448 m), weight 90 lb 9.6 oz (41.096 kg).  LABORATORY DATA: Lab Results  Component Value Date   WBC 4.9 02/06/2012   HGB 11.1* 02/06/2012   HCT 32.5* 02/06/2012   MCV 86.8 02/06/2012   PLT 278 02/06/2012      Chemistry      Component Value Date/Time   NA 137 01/09/2012 0500   K 4.3 01/09/2012 0500   CL 103 01/09/2012 0500   CO2 23 01/09/2012 0500   BUN 6 01/09/2012 0500    CREATININE 0.81 01/09/2012 0500   CREATININE 0.70 07/27/2011 1306      Component Value Date/Time   CALCIUM 9.2 01/09/2012 0500   ALKPHOS 116 01/09/2012 0500   AST 29 01/09/2012 0500   ALT 33 01/09/2012 0500   BILITOT 0.3 01/09/2012 0500     ADDITIONAL INFORMATION: 1. Following placement in clearing solution, three more lymph nodes were identified, all of which are benign. Thus, the total lymph node count is 11. (JK:mw 12-18-11) 2. Following an exhaustive search, additional tissue has been submitted in an attempt to recover lymph nodes. Two more lymph nodes were identified, both benign. This brings the total number of lymph nodes recovered to 13, all of which are benign. (JBK:eps 12/26/11) FINAL DIAGNOSIS Diagnosis 1. Colon, resection margin (donut), distal rectal - COLORECTAL MUCOSA WITH FIBROSIS AND EDEMA, CONSISTENT WITH TREATMENT. - THERE IS NO EVIDENCE OF MALIGNANCY. 2. Colon, total resection (incl lymph nodes) - COLON WITH SCATTERED TUBULAR ADENOMAS. - HIGH GRADE DYSPLASIA IS NOT IDENTIFIED. - BENIGN APPENDIX WITH FIBROUS OBLITERATION OF THE TIP. - THERE IS NO EVIDENCE OF CARCINOMA IN 8 OF 8 LYMPH NODES (0/8). - SEE ONCOLOGY TABLE BELOW. 3. Colon, resection margin (donut), new distal margin and rectum - BENIGN SQUAMOUS LINED MUCOSA. - THERE IS NO EVIDENCE OF MALIGNANCY. Microscopic Comment 2. COLON AND RECTUM Specimen: Colon, appendix, and terminal ileum. Procedure: Total colectomy. Tumor site: N/A Specimen integrity: Intact. Macroscopic intactness of mesorectum: Complete. Macroscopic tumor perforation: N/A Invasive tumor: N/A 1 of 3 FINAL for MISCHELLE, REEG ([id number]) Microscopic Comment(continued) Microscopic extension of invasive tumor: N/A Lymph-Vascular invasion: N/A Peri-neural invasion: N/A Tumor deposit(s) (discontinuous extramural extension): N/A Resection margins: Negative for adenocarcinoma. Treatment effect (neoadjuvant therapy): Present,  significant. Number of lymph nodes examined- 8 ; Number positive - 0 (PLEASE NOTE: Additional tissue will be submitted in an attempt to recover more lymph nodes) Additional polyp(s): Multiple scattered tubular adenomas. Pathologic Staging: ypT0, ypN0 Ancillary studies: N/A Comments: Grossly, there is a 1.5 cm granular depressed area of mucosa present at the distal portion of the specimen. This entire area has been submitted for histologic evaluation revealing inflamed, fibrotic, and edematous colorectal mucosa, consistent with treatment effect. Adenocarcinoma is not identified. In addition, there are scattered tubular adenomas throughout the  specimen. No high grade dysplasia is identified. The case was discussed with Dr. Donell Beers on 12/15/2011. (JBK:gt, 12/15/11) Pecola Leisure MD Pathologist, Electronic Signature (Case signed 12/15/2011) Valentino Hue  RADIOGRAPHIC STUDIES:  Nm Pet Image Initial (pi) Skull Base To Thigh 1.  Anorectal primary without evidence of distant metastasis. 2.  Perirectal/peritoneal nodes described on the prior diagnostic CT are less impressive today and may have undergone response to therapy.  Based on location, these were suspicious on the prior. 3.  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 Neoplasm cannot be excluded.  Consider further evaluation with ultrasound. 4.  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  Consider correlation with cardiac risk factors.  5.  Sinus disease.  Original Report Authenticated By: Consuello Bossier, M.D.     ASSESSMENT: 46 year old female with:  #1. stage III adenocarcinoma of the rectum. Patient is now status post definitive surgery after having received neoadjuvant chemotherapy and radiation therapy. Her final pathology does not reveal any evidence of residual disease. 8 nodes were negative for metastatic disease.  #2 patient is now recommended adjuvant chemotherapy consisting  of capecitabine and oxaliplatin. The capecitabine will be given 14 days on one week off.  oxaliplatinum will be every 21 days.Risks and benefits of therapy have been explained to the patient and her family.  PLAN: 1. Patient will need a port a cath and I have put in a consult for Dr. Donell Beers to place this for [place].  #2 patient will need the Xeloda approved by her insurance company and to get it from specialty clinic and we have put the requisition in for that as well.  #3 my plan will be to get the patient started on the adjuvant chemotherapy in about 3-4 weeks' time.  #4 all of this was explained to the patient as well as her family. Risks and benefits and rationale for the treatment were discussed with all of them. All questions were answered. The patient knows to call the clinic with any problems, questions or concerns. We can certainly see the patient much sooner if necessary.  I spent 30 minutes counseling the patient face to face. The total time spent in the appointment was 30 minutes.    Drue Second, MD Medical/Oncology [facility] [phone] (beeper) [phone] (Office)  02/06/2012, 4:22 PM

## 2012-02-07 ENCOUNTER — Other Ambulatory Visit (INDEPENDENT_AMBULATORY_CARE_PROVIDER_SITE_OTHER): Payer: Self-pay | Admitting: General Surgery

## 2012-02-07 ENCOUNTER — Other Ambulatory Visit: Payer: Self-pay | Admitting: Medical Oncology

## 2012-02-07 ENCOUNTER — Telehealth: Payer: Self-pay | Admitting: Oncology

## 2012-02-07 NOTE — Telephone Encounter (Signed)
S/w luwanna from clinical social work regarding this pt needing an interpreter for her may appts.

## 2012-02-09 ENCOUNTER — Encounter: Payer: Self-pay | Admitting: *Deleted

## 2012-02-09 ENCOUNTER — Encounter (INDEPENDENT_AMBULATORY_CARE_PROVIDER_SITE_OTHER): Payer: Self-pay

## 2012-02-09 NOTE — Progress Notes (Signed)
RECEIVED A FAX FROM [facility] CONCERNING A CONFIRMATION OF PRESCRIPTION SHIPMENT FOR XELODA. 

## 2012-02-12 ENCOUNTER — Telehealth: Payer: Self-pay | Admitting: *Deleted

## 2012-02-12 NOTE — Telephone Encounter (Signed)
Per staff message from [facility], I have put treatment appts in the computer. Crystal aware.  JMW

## 2012-02-13 ENCOUNTER — Encounter (HOSPITAL_COMMUNITY): Payer: Self-pay

## 2012-02-13 ENCOUNTER — Inpatient Hospital Stay (HOSPITAL_COMMUNITY): Admission: RE | Admit: 2012-02-13 | Discharge: 2012-02-13 | Payer: BC Managed Care – PPO | Source: Ambulatory Visit

## 2012-02-14 ENCOUNTER — Telehealth: Payer: Self-pay | Admitting: *Deleted

## 2012-02-14 NOTE — Telephone Encounter (Signed)
Pt's husband called with concern about Xeloda. Reviewed with Dr. Welton Flakes who advised pt is to not to start Xeloda until she comes back to see MD and is instructed to start medication. Pt is to have PAC on 05/29. Pt appt on 05/28 to be cancelled and r/s per MD for 06/11 0800 lab/ 0830 MD and chemo to follow. Called and notified pt who requested I call back in am and speak to her husband as she did not fully understand.

## 2012-02-15 ENCOUNTER — Encounter (HOSPITAL_COMMUNITY)
Admission: RE | Admit: 2012-02-15 | Discharge: 2012-02-15 | Disposition: A | Discharge status: Home / Self Care | Payer: BC Managed Care – PPO | Source: Ambulatory Visit | Attending: Anesthesiology | Admitting: Anesthesiology

## 2012-02-15 ENCOUNTER — Encounter (HOSPITAL_COMMUNITY)
Admission: RE | Admit: 2012-02-15 | Discharge: 2012-02-15 | Disposition: A | Discharge status: Home / Self Care | Payer: BC Managed Care – PPO | Source: Ambulatory Visit | Attending: General Surgery | Admitting: General Surgery

## 2012-02-15 LAB — CBC
HCT: 35.5 % — ABNORMAL LOW (ref 36.0–46.0)
Hemoglobin: 12 g/dL (ref 12.0–15.0)
MCHC: 33.8 g/dL (ref 30.0–36.0)
MCV: 85.7 fL (ref 78.0–100.0)
RDW: 16.9 % — ABNORMAL HIGH (ref 11.5–15.5)
WBC: 5.1 10*3/uL (ref 4.0–10.5)

## 2012-02-15 LAB — HCG, SERUM, QUALITATIVE: Preg, Serum: NEGATIVE

## 2012-02-15 LAB — SURGICAL PCR SCREEN: Staphylococcus aureus: NEGATIVE

## 2012-02-15 NOTE — Pre-Procedure Instructions (Signed)
20 Renee Nicholson  02/15/2012   Your procedure is scheduled on:  Wednesday Feb 21, 2012.  Report to [facility] at 0830 AM.  Call this number if you have problems the morning of surgery: [phone]   Remember:   Do not eat food:After Midnight.  May have clear liquids: up to 4 Hours before arrival until 0430 am.  Clear liquids include soda, tea, black coffee, apple or grape juice, broth.  Take these medicines the morning of surgery with A SIP OF WATER: Hydrocodone if needed for pain, Lorazepam (Ativan), Ondansetron (Zofran) if needed for nausea, and Pantoprazole (Protonix).   Do not wear jewelry, make-up or nail polish.  Do not wear lotions, powders, or perfumes. You may wear deodorant.  Do not shave 48 hours prior to surgery. Men may shave face and neck.  Do not bring valuables to the hospital.  Contacts, dentures or bridgework may not be worn into surgery.  Leave suitcase in the car. After surgery it may be brought to your room.  For patients admitted to the hospital, checkout time is 11:00 AM the day of discharge.   Patients discharged the day of surgery will not be allowed to drive home.  Name and phone number of your driver:   Special Instructions: CHG Shower Use Special Wash: 1/2 bottle night before surgery and 1/2 bottle morning of surgery.   Please read over the following fact sheets that you were given: Pain Booklet, Coughing and Deep Breathing, Surgical Site Infection Prevention and Anesthesia Post-op Instructions

## 2012-02-15 NOTE — Telephone Encounter (Signed)
Called s/w pt's husband this am per her request and discussed the following. Pt PAC scheduled on 05/29. Appt with MD to be re/s for 06/11 lab at 0800, MD at 0830 with Chemo to follow. Pt is not to start Xeloda until instructed to do so by Dr. Welton Flakes. She will go over this at pt's next office visit on 06/11. Mr. Schmoker questioned the early time for the appt. Explained pt will have a long Chemo treatment and will need to start this  appt early in the morning. Mr. Abate verbalized understanding about appt rescheduled and instructions with medications Informed Mr. Meares to expect a call from Scheduling regarding confirmation of appt date/ time for lab/md/chemo visit on 06/11  Onc Tx sched sent

## 2012-02-16 NOTE — Consult Note (Signed)
Anesthesia Chart Review:  Patient is a 46 year old female scheduled for Port-a-cath insertion on 02/21/12 for access for chemotherapy.  History includes stage III rectal adenocarcinoma s/p proctocolectomy, j-pouch with ileoanal anastomosis, loop ileostomy on 12/14/11.  She has already undergone previous chemo and radiation 08/22/11 - 10/09/11. Oncologist is Dr. Welton Flakes.    Other history includes anemia, anxiety, hysterectomy.  Her BMI is 19.7.    Labs from 02/06/12 and 02/15/12 noted.    CXR from 02/15/12 showed no evidence of acute cardiac or pulmonary process.   EKG on 02/15/12 showed NSR, septal infarct (age undetermined).  There are no prior EKGs within the [facility] or at her PCP office (Dr. Daleen Squibb).  No CV symptoms documented at her PAT appointment.  She has no known history of CAD/MI/CHF, DM or HTN.  She tolerated colon resection with ileostomy in March 2013.  If remains asymptomatic, then plan to proceed.  Shonna Chock, PA-C

## 2012-02-20 ENCOUNTER — Ambulatory Visit: Payer: BC Managed Care – PPO | Admitting: Oncology

## 2012-02-20 ENCOUNTER — Telehealth: Payer: Self-pay | Admitting: Oncology

## 2012-02-20 ENCOUNTER — Other Ambulatory Visit: Payer: BC Managed Care – PPO | Admitting: Lab

## 2012-02-20 ENCOUNTER — Encounter: Payer: Self-pay | Admitting: *Deleted

## 2012-02-20 MED ORDER — CEFAZOLIN SODIUM 1-5 GM-% IV SOLN
1.0000 g | INTRAVENOUS | Status: AC
  Administered 2012-02-21: 1 g via INTRAVENOUS
  Filled 2012-02-20: qty 50

## 2012-02-20 NOTE — Telephone Encounter (Signed)
S/w the pt and she is aware that her may appts have been r/s to june

## 2012-02-21 ENCOUNTER — Encounter (HOSPITAL_COMMUNITY)
Admission: RE | Disposition: A | Discharge status: Home / Self Care | Payer: Self-pay | Source: Ambulatory Visit | Attending: General Surgery

## 2012-02-21 ENCOUNTER — Ambulatory Visit (HOSPITAL_COMMUNITY)
Admission: RE | Admit: 2012-02-21 | Discharge: 2012-02-21 | Disposition: A | Discharge status: Home / Self Care | Payer: BC Managed Care – PPO | Source: Ambulatory Visit | Attending: General Surgery | Admitting: General Surgery

## 2012-02-21 ENCOUNTER — Ambulatory Visit (HOSPITAL_COMMUNITY): Payer: BC Managed Care – PPO

## 2012-02-21 ENCOUNTER — Encounter (HOSPITAL_COMMUNITY): Payer: Self-pay | Admitting: Vascular Surgery

## 2012-02-21 ENCOUNTER — Ambulatory Visit (HOSPITAL_COMMUNITY): Payer: BC Managed Care – PPO | Admitting: Vascular Surgery

## 2012-02-21 ENCOUNTER — Encounter (HOSPITAL_COMMUNITY): Payer: Self-pay | Admitting: *Deleted

## 2012-02-21 DIAGNOSIS — C20 Malignant neoplasm of rectum: Secondary | ICD-10-CM

## 2012-02-21 DIAGNOSIS — Z01812 Encounter for preprocedural laboratory examination: Secondary | ICD-10-CM | POA: Insufficient documentation

## 2012-02-21 DIAGNOSIS — Z01810 Encounter for preprocedural cardiovascular examination: Secondary | ICD-10-CM | POA: Insufficient documentation

## 2012-02-21 HISTORY — PX: PORTACATH PLACEMENT: SHX2246

## 2012-02-21 SURGERY — INSERTION, TUNNELED CENTRAL VENOUS DEVICE, WITH PORT
Anesthesia: General | Wound class: Clean

## 2012-02-21 MED ORDER — ONDANSETRON HCL 4 MG/2ML IJ SOLN
INTRAMUSCULAR | Status: AC
  Filled 2012-02-21: qty 2

## 2012-02-21 MED ORDER — SODIUM CHLORIDE 0.9 % IR SOLN
Status: DC | PRN
  Administered 2012-02-21: 11:00:00

## 2012-02-21 MED ORDER — ONDANSETRON HCL 4 MG/2ML IJ SOLN
INTRAMUSCULAR | Status: DC | PRN
  Administered 2012-02-21: 4 mg via INTRAVENOUS

## 2012-02-21 MED ORDER — FENTANYL CITRATE 0.05 MG/ML IJ SOLN
INTRAMUSCULAR | Status: DC | PRN
  Administered 2012-02-21: 50 ug via INTRAVENOUS

## 2012-02-21 MED ORDER — LACTATED RINGERS IV SOLN
INTRAVENOUS | Status: DC
Start: 2012-02-21 — End: 2012-02-21
  Administered 2012-02-21: 10:00:00 via INTRAVENOUS

## 2012-02-21 MED ORDER — HYDROCODONE-ACETAMINOPHEN 5-500 MG PO TABS: 1.0000 | ORAL_TABLET | Freq: Four times a day (QID) | ORAL | Status: DC | PRN

## 2012-02-21 MED ORDER — MIDAZOLAM HCL 5 MG/5ML IJ SOLN
INTRAMUSCULAR | Status: DC | PRN
  Administered 2012-02-21: 1 mg via INTRAVENOUS

## 2012-02-21 MED ORDER — HYDROMORPHONE HCL PF 1 MG/ML IJ SOLN
0.2500 mg | INTRAMUSCULAR | Status: DC | PRN
  Administered 2012-02-21 (×2): 0.5 mg via INTRAVENOUS

## 2012-02-21 MED ORDER — LIDOCAINE HCL (PF) 1 % IJ SOLN
INTRAMUSCULAR | Status: DC | PRN
  Administered 2012-02-21: 3 mL

## 2012-02-21 MED ORDER — NEOSTIGMINE METHYLSULFATE 1 MG/ML IJ SOLN
INTRAMUSCULAR | Status: DC | PRN
  Administered 2012-02-21: 3 mg via INTRAVENOUS

## 2012-02-21 MED ORDER — VECURONIUM BROMIDE 10 MG IV SOLR
INTRAVENOUS | Status: DC | PRN
  Administered 2012-02-21: 4 mg via INTRAVENOUS

## 2012-02-21 MED ORDER — PHENYLEPHRINE HCL 10 MG/ML IJ SOLN
INTRAMUSCULAR | Status: DC | PRN
  Administered 2012-02-21 (×4): 40 ug via INTRAVENOUS

## 2012-02-21 MED ORDER — HEPARIN SOD (PORK) LOCK FLUSH 100 UNIT/ML IV SOLN
INTRAVENOUS | Status: DC | PRN
  Administered 2012-02-21: 500 [IU] via INTRAVENOUS

## 2012-02-21 MED ORDER — PROPOFOL 10 MG/ML IV EMUL
INTRAVENOUS | Status: DC | PRN
  Administered 2012-02-21: 120 mg via INTRAVENOUS

## 2012-02-21 MED ORDER — BUPIVACAINE-EPINEPHRINE 0.25% -1:200000 IJ SOLN
INTRAMUSCULAR | Status: DC | PRN
  Administered 2012-02-21: 3 mL

## 2012-02-21 MED ORDER — GLYCOPYRROLATE 0.2 MG/ML IJ SOLN
INTRAMUSCULAR | Status: DC | PRN
  Administered 2012-02-21: .4 mg via INTRAVENOUS

## 2012-02-21 MED ORDER — ONDANSETRON HCL 4 MG/2ML IJ SOLN
4.0000 mg | Freq: Four times a day (QID) | INTRAMUSCULAR | Status: DC | PRN
  Administered 2012-02-21: 4 mg via INTRAVENOUS
  Filled 2012-02-21: qty 2

## 2012-02-21 MED ORDER — LACTATED RINGERS IV SOLN
INTRAVENOUS | Status: DC | PRN
  Administered 2012-02-21 (×2): via INTRAVENOUS

## 2012-02-21 SURGICAL SUPPLY — 53 items
BAG DECANTER FOR FLEXI CONT (MISCELLANEOUS) ×2 IMPLANT
BLADE SURG 11 STRL SS (BLADE) ×2 IMPLANT
BLADE SURG 15 STRL LF DISP TIS (BLADE) ×1 IMPLANT
BLADE SURG 15 STRL SS (BLADE) ×2
CANISTER SUCTION 2500CC (MISCELLANEOUS) IMPLANT
CHLORAPREP W/TINT 10.5 ML (MISCELLANEOUS) ×2 IMPLANT
CLOTH BEACON ORANGE TIMEOUT ST (SAFETY) ×2 IMPLANT
COVER DRAPE ULTRASOUND 610 023 (DRAPES) IMPLANT
COVER SURGICAL LIGHT HANDLE (MISCELLANEOUS) ×2 IMPLANT
CRADLE DONUT ADULT HEAD (MISCELLANEOUS) ×2 IMPLANT
DECANTER SPIKE VIAL GLASS SM (MISCELLANEOUS) ×4 IMPLANT
DERMABOND ADVANCED (GAUZE/BANDAGES/DRESSINGS) ×1
DERMABOND ADVANCED .7 DNX12 (GAUZE/BANDAGES/DRESSINGS) ×1 IMPLANT
DRAPE C-ARM 42X72 X-RAY (DRAPES) ×2 IMPLANT
DRAPE LAPAROSCOPIC ABDOMINAL (DRAPES) ×2 IMPLANT
DRAPE UTILITY 15X26 W/TAPE STR (DRAPE) ×4 IMPLANT
DRAPE WARM FLUID 44X44 (DRAPE) IMPLANT
ELECT CAUTERY BLADE 6.4 (BLADE) ×2 IMPLANT
ELECT REM PT RETURN 9FT ADLT (ELECTROSURGICAL) ×2
ELECTRODE REM PT RTRN 9FT ADLT (ELECTROSURGICAL) ×1 IMPLANT
GAUZE SPONGE 4X4 16PLY XRAY LF (GAUZE/BANDAGES/DRESSINGS) ×2 IMPLANT
GLOVE BIO SURGEON STRL SZ 6 (GLOVE) ×2 IMPLANT
GLOVE BIOGEL PI IND STRL 6.5 (GLOVE) ×1 IMPLANT
GLOVE BIOGEL PI IND STRL 7.0 (GLOVE) ×1 IMPLANT
GLOVE BIOGEL PI INDICATOR 6.5 (GLOVE) ×1
GLOVE BIOGEL PI INDICATOR 7.0 (GLOVE) ×1
GLOVE SS BIOGEL STRL SZ 6.5 (GLOVE) ×1 IMPLANT
GLOVE SUPERSENSE BIOGEL SZ 6.5 (GLOVE) ×1
GOWN PREVENTION PLUS XXLARGE (GOWN DISPOSABLE) ×2 IMPLANT
GOWN STRL NON-REIN LRG LVL3 (GOWN DISPOSABLE) IMPLANT
KIT BASIN OR (CUSTOM PROCEDURE TRAY) ×2 IMPLANT
KIT PORT POWER 8FR ALT SEPTUM (Miscellaneous) ×2 IMPLANT
KIT PORT POWER 9.6FR MRI PREA (Catheter) IMPLANT
KIT PORT POWER ISP 8FR (Catheter) IMPLANT
KIT POWER CATH 8FR (Catheter) IMPLANT
KIT ROOM TURNOVER OR (KITS) ×2 IMPLANT
NEEDLE HYPO 25GX1X1/2 BEV (NEEDLE) ×2 IMPLANT
NS IRRIG 1000ML POUR BTL (IV SOLUTION) ×2 IMPLANT
PACK SURGICAL SETUP 50X90 (CUSTOM PROCEDURE TRAY) ×2 IMPLANT
PAD ARMBOARD 7.5X6 YLW CONV (MISCELLANEOUS) ×4 IMPLANT
PENCIL BUTTON HOLSTER BLD 10FT (ELECTRODE) ×2 IMPLANT
SUT MON AB 4-0 PC3 18 (SUTURE) ×2 IMPLANT
SUT PROLENE 2 0 SH DA (SUTURE) ×4 IMPLANT
SUT VIC AB 3-0 SH 27 (SUTURE) ×2
SUT VIC AB 3-0 SH 27X BRD (SUTURE) ×1 IMPLANT
SYR 20ML ECCENTRIC (SYRINGE) ×4 IMPLANT
SYR 5ML LUER SLIP (SYRINGE) ×2 IMPLANT
SYR CONTROL 10ML LL (SYRINGE) ×2 IMPLANT
TOWEL OR 17X24 6PK STRL BLUE (TOWEL DISPOSABLE) IMPLANT
TOWEL OR 17X26 10 PK STRL BLUE (TOWEL DISPOSABLE) IMPLANT
TUBE CONNECTING 12X1/4 (SUCTIONS) IMPLANT
WATER STERILE IRR 1000ML POUR (IV SOLUTION) IMPLANT
YANKAUER SUCT BULB TIP NO VENT (SUCTIONS) IMPLANT

## 2012-02-21 NOTE — Transfer of Care (Signed)
Immediate Anesthesia Transfer of Care Note  Patient: Renee Nicholson  Procedure(s) Performed: Procedure(s) (LRB): INSERTION PORT-A-CATH (N/A)  Patient Location: PACU  Anesthesia Type: General  Level of Consciousness: awake, alert  and oriented  Airway & Oxygen Therapy: Patient Spontanous Breathing and Patient connected to nasal cannula oxygen  Post-op Assessment: Report given to PACU RN, Post -op Vital signs reviewed and stable and Patient moving all extremities X 4  Post vital signs: Reviewed and stable  Complications: No apparent anesthesia complications

## 2012-02-21 NOTE — H&P (View-Only) (Signed)
HISTORY: Pt is doing well after lap TAC/IPAA/ileal pouch anal anastamosis.      EXAM: General:  Looks more energetic and less poor. Incision:  Abd soft, non tender, non distended.  Ostomy more pasty.    ASSESSMENT AND PLAN:   High output ileostomy Output is coming down. Will try stopping IV Fluids Will check labs next week to make sure dehydration is not recurring.    Rectal cancer s/p proctocolectomy, j-pouch with ileoanal anastomosis, loop ileostomy on 12/14/11 Doing much better. Eating well.  Will recheck labs next week.         Maudry Diego, MD Surgical Oncology, General & Endocrine Surgery [facility]  [name], MD, MD [name], MD

## 2012-02-21 NOTE — Interval H&P Note (Signed)
History and Physical Interval Note:  02/21/2012 10:20 AM  Renee Nicholson  has presented today for surgery, with the diagnosis of rectal cancer   The various methods of treatment have been discussed with the patient and family. After consideration of risks, benefits and other options for treatment, the patient has consented to  Procedure(s) (LRB): INSERTION PORT-A-CATH (N/A) as a surgical intervention .  The patients' history has been reviewed, patient examined, no change in status, stable for surgery.  I have reviewed the patients' chart and labs.  Questions were answered to the patient's satisfaction.   We will try for the left side first.  [facility]

## 2012-02-21 NOTE — Preoperative (Signed)
Beta Blockers   Reason not to administer Beta Blockers:Not Applicable 

## 2012-02-21 NOTE — Anesthesia Preprocedure Evaluation (Addendum)
Anesthesia Evaluation  Patient identified by MRN, date of birth, ID band Patient awake    Reviewed: Allergy & Precautions, H&P , NPO status , Patient's Chart, lab work & pertinent test results  Airway Mallampati: II      Dental  (+) Dental Advidsory Given   Pulmonary neg pulmonary ROS,  breath sounds clear to auscultation        Cardiovascular negative cardio ROS  Rhythm:Regular Rate:Normal     Neuro/Psych    GI/Hepatic Neg liver ROS,   Endo/Other  negative endocrine ROS  Renal/GU negative Renal ROS     Musculoskeletal   Abdominal   Peds  Hematology negative hematology ROS (+)   Anesthesia Other Findings   Reproductive/Obstetrics                         Anesthesia Physical Anesthesia Plan  ASA: III  Anesthesia Plan: General   Post-op Pain Management:    Induction: Intravenous  Airway Management Planned: Oral ETT  Additional Equipment:   Intra-op Plan:   Post-operative Plan: Extubation in OR  Informed Consent:   Dental Advisory Given  Plan Discussed with: CRNA and Anesthesiologist  Anesthesia Plan Comments:        Anesthesia Quick Evaluation

## 2012-02-21 NOTE — Anesthesia Procedure Notes (Signed)
Procedure Name: Intubation Date/Time: 02/21/2012 10:48 AM Performed by: Carmela Rima Pre-anesthesia Checklist: Patient identified, Timeout performed, Emergency Drugs available, Suction available and Patient being monitored Patient Re-evaluated:Patient Re-evaluated prior to inductionOxygen Delivery Method: Circle system utilized Preoxygenation: Pre-oxygenation with 100% oxygen Intubation Type: IV induction Ventilation: Mask ventilation without difficulty Laryngoscope Size: Mac and 4 Grade View: Grade I Tube type: Oral Tube size: 7.0 mm Number of attempts: 1 Placement Confirmation: ETT inserted through vocal cords under direct vision,  breath sounds checked- equal and bilateral and positive ETCO2 Secured at: 21 cm Tube secured with: Tape Dental Injury: Teeth and Oropharynx as per pre-operative assessment

## 2012-02-21 NOTE — Anesthesia Postprocedure Evaluation (Signed)
  Anesthesia Post-op Note  Patient: Renee Nicholson  Procedure(s) Performed: Procedure(s) (LRB): INSERTION PORT-A-CATH (N/A)  Patient Location: PACU  Anesthesia Type: General  Level of Consciousness: awake  Airway and Oxygen Therapy: Patient Spontanous Breathing  Post-op Pain: mild  Post-op Assessment: Post-op Vital signs reviewed  Post-op Vital Signs: Reviewed  Complications: No apparent anesthesia complications

## 2012-02-21 NOTE — Discharge Instructions (Signed)
[facility] °Office Phone Number [phone] ° ° POST OP INSTRUCTIONS ° °Always review your discharge instruction sheet given to you by the facility where your surgery was performed. ° °IF YOU HAVE DISABILITY OR FAMILY LEAVE FORMS, YOU MUST BRING THEM TO THE OFFICE FOR PROCESSING.  DO NOT GIVE THEM TO YOUR DOCTOR. ° °1. A prescription for pain medication may be given to you upon discharge.  Take your pain medication as prescribed, if needed.  If narcotic pain medicine is not needed, then you may take acetaminophen (Tylenol) or ibuprofen (Advil) as needed. °2. Take your usually prescribed medications unless otherwise directed °3. If you need a refill on your pain medication, please contact your pharmacy.  They will contact our office to request authorization.  Prescriptions will not be filled after 5pm or on week-ends. °4. You should eat very light the first 24 hours after surgery, such as soup, crackers, pudding, etc.  Resume your normal diet the day after surgery °5. It is common to experience some constipation if taking pain medication after surgery.  Increasing fluid intake and taking a stool softener will usually help or prevent this problem from occurring.  A mild laxative (Milk of Magnesia or Miralax) should be taken according to package directions if there are no bowel movements after 48 hours. °6. You may shower in 48 hours.  The surgical glue will flake off in 2-3 weeks.   °7. ACTIVITIES:  No strenuous activity or heavy lifting for 1 week.   °a. You may drive when you no longer are taking prescription pain medication, you can comfortably wear a seatbelt, and you can safely maneuver your car and apply brakes. °b. RETURN TO WORK:  __________to be determined._______________ °You should see your doctor in the office for a follow-up appointment approximately three-four weeks after your surgery.   ° °WHEN TO CALL YOUR DOCTOR: °1. Fever over 101.0 °2. Nausea and/or vomiting. °3. Extreme swelling  or bruising. °4. Continued bleeding from incision. °5. Increased pain, redness, or drainage from the incision. ° °The clinic staff is available to answer your questions during regular business hours.  Please don’t hesitate to call and ask to speak to one of the nurses for clinical concerns.  If you have a medical emergency, go to the nearest emergency room or call 911.  A surgeon from [facility] is always on call at the hospital. ° °For further questions, please visit [email or web address]  ° °

## 2012-02-21 NOTE — Op Note (Signed)
PREOPERATIVE DIAGNOSIS:  [id number] rectal cancer     POSTOPERATIVE DIAGNOSIS:  Same     PROCEDURE: Left subclavian ultrasound-guided port placement, Bard   Power Port, MRI safe, 8-French.      SURGEON:  [name], MD      ANESTHESIA:  General   FINDINGS:  Good venous return, easy flush, and tip of the catheter and   SVC 21 cm.      SPECIMEN:  None.      ESTIMATED BLOOD LOSS:  Minimal.      COMPLICATIONS:  None known.      PROCEDURE:  Pt was identified in the holding area and taken to   the operating room, where patient was placed supine on the operating room   table.  MAC anesthesia was induced.  Patient's arms were tucked and the upper   chest and neck were prepped and draped in sterile fashion.  Time-out was   performed according to the surgical safety check list.  When all was   correct, we continued.   Local anesthetic was administered over this   area at the angle of the clavicle.  The vein was accessed with 1 passes of the needle. There was good venous return and the wire passed easily with no ectopy.   Fluoroscopy was used to confirm that the wire was in the vena cava.      The patient was placed back level and the area for the pocket was anethetized   with local anesthetic.  A 3-cm transverse incision was made with a #15   blade.  Cautery was used to divide the subcutaneous tissues down to the   pectoralis muscle.  An [employer] retractor was used to elevate the skin   while a pocket was created on top of the pectoralis fascia.  The port   was placed into the pocket to confirm that it was of adequate size.  The   catheter was preattached to the port.  The port was then secured to the   pectoralis fascia with three 2-0 Prolene sutures.  These were clamped and   not tied down yet.    The catheter was tunneled through to the wire exit   site.  The catheter was placed along the wire to determine what length it should be to be in the SVC.  The catheter was cut at 21 cm.  The  tunneler sheath and dilator were passed over the wire and the dilator and wire were removed.  The catheter was advanced through the tunneler sheath and the tunneler sheath was pulled away.  Care was taken to keep the catheter in the tunneler sheath as this occurred. This was advanced and the tunneler sheath was removed.  There was good venous   return and easy flush of the catheter.  The Prolene sutures were tied   down to the pectoral fascia.  The skin was reapproximated using 3-0   Vicryl interrupted deep dermal sutures.    Fluoroscopy was used to re-confirm good position of the catheter.  The skin   was then closed using 4-0 Monocryl in a subcuticular fashion.  The port was flushed with concentrated heparin flush as well.  The wounds were then cleaned, dried, and dressed with Dermabond.  The patient was awakened from anesthesia and taken to the PACU in stable condition.  Needle, sponge, and instrument counts were correct.               [name], MD

## 2012-02-22 ENCOUNTER — Ambulatory Visit: Payer: BC Managed Care – PPO

## 2012-02-27 ENCOUNTER — Encounter (HOSPITAL_COMMUNITY): Payer: Self-pay | Admitting: General Surgery

## 2012-03-05 ENCOUNTER — Ambulatory Visit (HOSPITAL_BASED_OUTPATIENT_CLINIC_OR_DEPARTMENT_OTHER): Payer: BC Managed Care – PPO | Admitting: Oncology

## 2012-03-05 ENCOUNTER — Ambulatory Visit: Payer: BC Managed Care – PPO | Admitting: Nutrition

## 2012-03-05 ENCOUNTER — Ambulatory Visit (HOSPITAL_BASED_OUTPATIENT_CLINIC_OR_DEPARTMENT_OTHER): Payer: BC Managed Care – PPO

## 2012-03-05 ENCOUNTER — Encounter: Payer: Self-pay | Admitting: Oncology

## 2012-03-05 ENCOUNTER — Other Ambulatory Visit (HOSPITAL_BASED_OUTPATIENT_CLINIC_OR_DEPARTMENT_OTHER): Payer: BC Managed Care – PPO | Admitting: Lab

## 2012-03-05 VITALS — BP 100/64 | HR 68 | Temp 98.8°F | Ht <= 58 in | Wt 90.8 lb

## 2012-03-05 VITALS — BP 122/68 | HR 64 | Temp 97.3°F

## 2012-03-05 DIAGNOSIS — R142 Eructation: Secondary | ICD-10-CM

## 2012-03-05 DIAGNOSIS — Z5111 Encounter for antineoplastic chemotherapy: Secondary | ICD-10-CM

## 2012-03-05 DIAGNOSIS — Z923 Personal history of irradiation: Secondary | ICD-10-CM

## 2012-03-05 DIAGNOSIS — C20 Malignant neoplasm of rectum: Secondary | ICD-10-CM

## 2012-03-05 LAB — CBC WITH DIFFERENTIAL/PLATELET
BASO%: 0.4 % (ref 0.0–2.0)
HCT: 33.2 % — ABNORMAL LOW (ref 34.8–46.6)
MCHC: 34.3 g/dL (ref 31.5–36.0)
MONO#: 0.5 10*3/uL (ref 0.1–0.9)
NEUT#: 3 10*3/uL (ref 1.5–6.5)
RBC: 3.96 10*6/uL (ref 3.70–5.45)
WBC: 4.7 10*3/uL (ref 3.9–10.3)
lymph#: 0.9 10*3/uL (ref 0.9–3.3)

## 2012-03-05 LAB — COMPREHENSIVE METABOLIC PANEL
ALT: 13 U/L (ref 0–35)
Albumin: 3.9 g/dL (ref 3.5–5.2)
CO2: 25 mEq/L (ref 19–32)
Calcium: 9 mg/dL (ref 8.4–10.5)
Chloride: 105 mEq/L (ref 96–112)
Sodium: 139 mEq/L (ref 135–145)
Total Protein: 6.3 g/dL (ref 6.0–8.3)

## 2012-03-05 MED ORDER — SODIUM CHLORIDE 0.9 % IJ SOLN
10.0000 mL | INTRAMUSCULAR | Status: DC | PRN
  Administered 2012-03-05: 10 mL
  Filled 2012-03-05: qty 10

## 2012-03-05 MED ORDER — OXALIPLATIN CHEMO INJECTION 100 MG/20ML
130.0000 mg/m2 | Freq: Once | INTRAVENOUS | Status: AC
  Administered 2012-03-05: 170 mg via INTRAVENOUS
  Filled 2012-03-05: qty 34

## 2012-03-05 MED ORDER — DEXAMETHASONE SODIUM PHOSPHATE 10 MG/ML IJ SOLN
10.0000 mg | Freq: Once | INTRAMUSCULAR | Status: AC
  Administered 2012-03-05: 10 mg via INTRAVENOUS

## 2012-03-05 MED ORDER — HEPARIN SOD (PORK) LOCK FLUSH 100 UNIT/ML IV SOLN
500.0000 [IU] | Freq: Once | INTRAVENOUS | Status: AC | PRN
  Administered 2012-03-05: 500 [IU]
  Filled 2012-03-05: qty 5

## 2012-03-05 MED ORDER — LIDOCAINE-PRILOCAINE 2.5-2.5 % EX CREA: TOPICAL_CREAM | CUTANEOUS | Status: DC | PRN

## 2012-03-05 MED ORDER — DEXTROSE 5 % IV SOLN
Freq: Once | INTRAVENOUS | Status: AC
  Administered 2012-03-05: 10:00:00 via INTRAVENOUS

## 2012-03-05 MED ORDER — ONDANSETRON 8 MG/50ML IVPB (CHCC)
8.0000 mg | Freq: Once | INTRAVENOUS | Status: AC
  Administered 2012-03-05: 8 mg via INTRAVENOUS

## 2012-03-05 NOTE — Patient Instructions (Signed)
1. Proceed with chemotherapy today.     1. You received Oxaliplatin in the office today.   2. You will start xeloda 3 pills twice a day for 14 days beginning today. Then stop for 7 days  2. Take your nausea medications as follows:    1. Odansetron 8 mg every 8 hours for the first 48 hours then as needed  2. Compazine 10 mg every 6 hours as needed if the odansetron does not work or you have nausea in spite of the above medicine.  3. I will see you back in 1 week for follow up, drink plenty of fluids, you can use beano or mylecon drops if you have a lot of gas.  4. Take your diarrhea medicine as needed.  5. Call with any problems or questions

## 2012-03-05 NOTE — Progress Notes (Signed)
I was asked to see Ms. Pore today while I was in the chemotherapy area. She is requesting additional samples of Ensure.  Upon reviewing her chart, she is status post ileostomy and concurrent chemoradiation therapy.  Her weight has decreased to 90.8 pounds from 98.0 pounds on March 19th.  Current BMI is 19.64.  The patient reports she is having difficulty eating.  Her ileostomy output was noted to be decreased.  She continues to follow a vegetarian diet and is looking for ways to increase her calorie intake.  NUTRITION DIAGNOSIS:  Food and nutrition related knowledge deficit continues.  INTERVENTION:  I have educated briefly the patient on increasing Ensure Plus as tolerated.  I have also provided them with some options for protein examples that fit into the vegetarian eating plan.  I have encouraged her to increase her fluid intake and discussed foods that may have a stool-thickening effect.  MONITORING/EVALUATION/GOALS:  The patient will tolerate oral diet to minimize further weight loss and decrease ostomy output.  NEXT VISIT:  Tuesday, June 25th during chemotherapy.    ______________________________ Zenovia Jarred, RD, LDN Clinical Nutrition Specialist BN/MEDQ  D:  03/05/2012  T:  03/05/2012  Job:  [id number]

## 2012-03-05 NOTE — Progress Notes (Signed)
OFFICE PROGRESS NOTE  CC: [name], MD [name], MD [name], MD [name], MD, MD [address] [place] [address] [name], MD  DIAGNOSIS: 46 year old female with new diagnosis of rectal carcinoma by rectal ultrasound she was found to have a T3 N1 lesion.  PRIOR THERAPY:   #1 patient presented with a several month history of diarrhea and then subsequent bleeding. She was found to be anemic with a hemoglobin of 10. Because of this she went on to have a colonoscopy performed. The colonoscopy showed multiple sessile polyps in the cecum ascending transverse and sigmoid colon as well as the rectum. The rectal area showed a large exophytic rectal mass approximately 1 cm above the dentate line measuring 3-4 cm. She had a biopsy of this performed and was found to be an adenocarcinoma consistent with a rectal primary.  #2 patient was seen by Dr. [name] at [facility] who performed a rectal ultrasound. The staging clinically was T3 N1. It was recommended by Dr. [name] the patient undergo neoadjuvant chemotherapy and radiation higher to her definitive surgery.  #3 Patient has completed concurrent radiation and chemotherapy. Her chemotherapy consisted of single agent xeloda administrated 08/22/11 - 10/09/2011. Overall she tolerated her treatment very well  #4 Has completed all of neoadjuvant treatment And the PET scan shows no evidence of distant metastasis and a para rectal peritoneal nodes less impressive she has had significant response to therapy.  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 APC is negative  #6 patient had a laparotomy with proctocolectomy, J-pouch with ileoanal anastomosis, loop ileostomy on 12/14/2011. The final pathology did not reveal any evidence of residual disease. Patient  had a complicated postop course including high output ileostomy. She required intensive IV fluids on an outpatient basis to prevent dehydration.  CURRENT THERAPY: to begin adjuvant chemotherapy consisting of XELOX  INTERVAL HISTORY: Jayana Ingle 46 y.o. female returns for followup visit today. Clinically she looks great. She is accompanied by her husband her daughter and a Nurse, learning disability. She denies any fevers chills night sweats headaches shortness of breath chest pains palpitations she does have an ostomy bag and there is a home loose stool but no blood in. She has not complained of any excessive diarrhea. She however does complain of having a lot of flatulence. I do think that it may be her diet. She is a vegetarian and she does eat a lot of been sent other such things. She has no rashes. No bleeding problems and remainder of the 10 point review of systems is negative. MEDICAL HISTORY: Past Medical History  Diagnosis Date  . Anemia   . Diarrhea   . Rectal cancer 08/16/2011  . Colon cancer   . Anxiety   . Bright red rectal bleeding 09/13/2011  . History of chemotherapy     completed 09/2011   . Hx of radiation therapy 08/29/11 to 10/09/11    rectum    ALLERGIES:   has no known allergies.  MEDICATIONS:  Current Outpatient Prescriptions  Medication Sig Dispense Refill  . feeding supplement (ENSURE COMPLETE) LIQD Take 237 mLs by mouth 3 (three) times daily between meals.  50 Bottle  6  . Ferrous Sulfate (IRON) 28 MG TABS Take 1 tablet by mouth daily.      Marland Kitchen loperamide (IMODIUM) 2 MG capsule Take 2 capsules  by mouth Every 4 hours as needed.      . megestrol (MEGACE) 400 MG/10ML suspension Take 10 mLs (400 mg total) by mouth 2 (two) times daily.  240 mL  1  . Multiple Vitamin (MULTIVITAMIN) tablet Take 1 tablet by mouth every morning.       . pantoprazole (PROTONIX) 40 MG tablet Take 40 mg by mouth daily.      . potassium chloride (K-DUR,KLOR-CON) 20 MEQ tablet Take 2 tablets (40 mEq total) by  mouth 2 (two) times daily.  60 tablet  1  . acetaminophen (TYLENOL) 325 MG tablet Take 2 tablets (650 mg total) by mouth every 4 (four) hours as needed.  200 tablet  1  . dexamethasone (DECADRON) 4 MG tablet Take 2 tablets by mouth daily starting the day after chemotherapy for 2 days. Take with food.  30 tablet  1  . Heparin Lock Flush (HEPARIN FLUSH, PORCINE,) 100 UNIT/ML injection 1 mL (100 Units total) by Intracatheter route as needed (250 units per lumen).  5 Syringe  3  . HYDROcodone-acetaminophen (VICODIN) 5-500 MG per tablet Take 1-2 tablets by mouth every 6 (six) hours as needed for pain.  30 tablet  0  . polysaccharide iron (NIFEREX) 150 MG CAPS capsule Take 150 mg by mouth every morning.       . prochlorperazine (COMPAZINE) 10 MG tablet Take 1 tablet (10 mg total) by mouth every 6 (six) hours as needed (Nausea or vomiting).  30 tablet  1  . prochlorperazine (COMPAZINE) 25 MG suppository Place 1 suppository (25 mg total) rectally every 12 (twelve) hours as needed for nausea.  12 suppository  3  . simethicone (MYLICON) 80 MG chewable tablet Chew 80 mg by mouth every 6 (six) hours as needed. For gas        SURGICAL HISTORY:  Past Surgical History  Procedure Date  . Tubal ligation   . Carpal tunnel release   . Colonoscopy 07/26/2011    Procedure: COLONOSCOPY;  Surgeon: Arlyce Harman, MD;  Location: [facility];  Service: Endoscopy;  Laterality: N/A;  10:40  . Esophagogastroduodenoscopy 07/26/2011    Procedure: ESOPHAGOGASTRODUODENOSCOPY (EGD);  Surgeon: Arlyce Harman, MD;  Location: [facility];  Service: Endoscopy;  Laterality: N/A;  . Esophagogastroduodenoscopy 12/27/2011    Procedure: ESOPHAGOGASTRODUODENOSCOPY (EGD);  Surgeon: Shirley Friar, MD;  Location: [facility];  Service: Endoscopy;  Laterality: N/A;  . Laparoscopic colon resection 12/14/11    diverting ileostomy  . Portacath placement 02/21/2012    Procedure: INSERTION PORT-A-CATH;  Surgeon: [name], MD;   Location: [facility] OR;  Service: General;  Laterality: N/A;    REVIEW OF SYSTEMS:  Pertinent items are noted in HPI.   PHYSICAL EXAMINATION: General appearance: alert, cooperative, appears stated age, mild distress and pale Resp: clear to auscultation bilaterally and normal percussion bilaterally Cardio: regular rate and rhythm, S1, S2 normal, no murmur, click, rub or gallop GI: soft, non-tender; bowel sounds normal; no masses,  no organomegaly Extremities: extremities normal, atraumatic, no cyanosis or edema Neurologic: Alert and oriented X 3, normal strength and tone. Normal symmetric reflexes. Normal coordination and gait Rectal Exam: patient does not have any active bleeding, there is noted to be external hemorrhoids.  ECOG PERFORMANCE STATUS: 0 - Asymptomatic  Blood pressure 100/64, pulse 68, temperature 98.8 F (37.1 C), temperature source Oral, height 4\' 9"  (1.448 m), weight 90 lb 12.8 oz (41.187 kg), last menstrual period 02/20/2012.  LABORATORY DATA: Lab Results  Component Value Date  WBC 4.7 03/05/2012   HGB 11.4* 03/05/2012   HCT 33.2* 03/05/2012   MCV 83.8 03/05/2012   PLT 220 03/05/2012      Chemistry      Component Value Date/Time   NA 138 02/06/2012 1209   K 4.2 02/06/2012 1209   CL 106 02/06/2012 1209   CO2 23 02/06/2012 1209   BUN 15 02/06/2012 1209   CREATININE 0.77 02/06/2012 1209   CREATININE 0.70 07/27/2011 1306      Component Value Date/Time   CALCIUM 9.4 02/06/2012 1209   ALKPHOS 76 02/06/2012 1209   AST 20 02/06/2012 1209   ALT 20 02/06/2012 1209   BILITOT 0.5 02/06/2012 1209     ADDITIONAL INFORMATION: 1. Following placement in clearing solution, three more lymph nodes were identified, all of which are benign. Thus, the total lymph node count is 11. (JK:mw 12-18-11) 2. Following an exhaustive search, additional tissue has been submitted in an attempt to recover lymph nodes. Two more lymph nodes were identified, both benign. This brings the total number of lymph  nodes recovered to 13, all of which are benign. (JBK:eps 12/26/11) FINAL DIAGNOSIS Diagnosis 1. Colon, resection margin (donut), distal rectal - COLORECTAL MUCOSA WITH FIBROSIS AND EDEMA, CONSISTENT WITH TREATMENT. - THERE IS NO EVIDENCE OF MALIGNANCY. 2. Colon, total resection (incl lymph nodes) - COLON WITH SCATTERED TUBULAR ADENOMAS. - HIGH GRADE DYSPLASIA IS NOT IDENTIFIED. - BENIGN APPENDIX WITH FIBROUS OBLITERATION OF THE TIP. - THERE IS NO EVIDENCE OF CARCINOMA IN 8 OF 8 LYMPH NODES (0/8). - SEE ONCOLOGY TABLE BELOW. 3. Colon, resection margin (donut), new distal margin and rectum - BENIGN SQUAMOUS LINED MUCOSA. - THERE IS NO EVIDENCE OF MALIGNANCY. Microscopic Comment 2. COLON AND RECTUM Specimen: Colon, appendix, and terminal ileum. Procedure: Total colectomy. Tumor site: N/A Specimen integrity: Intact. Macroscopic intactness of mesorectum: Complete. Macroscopic tumor perforation: N/A Invasive tumor: N/A 1 of 3 FINAL for AMEIRAH, KHATOON ([id number]) Microscopic Comment(continued) Microscopic extension of invasive tumor: N/A Lymph-Vascular invasion: N/A Peri-neural invasion: N/A Tumor deposit(s) (discontinuous extramural extension): N/A Resection margins: Negative for adenocarcinoma. Treatment effect (neoadjuvant therapy): Present, significant. Number of lymph nodes examined- 8 ; Number positive - 0 (PLEASE NOTE: Additional tissue will be submitted in an attempt to recover more lymph nodes) Additional polyp(s): Multiple scattered tubular adenomas. Pathologic Staging: ypT0, ypN0 Ancillary studies: N/A Comments: Grossly, there is a 1.5 cm granular depressed area of mucosa present at the distal portion of the specimen. This entire area has been submitted for histologic evaluation revealing inflamed, fibrotic, and edematous colorectal mucosa, consistent with treatment effect. Adenocarcinoma is not identified. In addition, there are scattered tubular adenomas throughout  the specimen. No high grade dysplasia is identified. The case was discussed with Dr. Donell Beers on 12/15/2011. (JBK:gt, 12/15/11) Pecola Leisure MD Pathologist, Electronic Signature (Case signed 12/15/2011) Valentino Hue  RADIOGRAPHIC STUDIES:  Nm Pet Image Initial (pi) Skull Base To Thigh 1.  Anorectal primary without evidence of distant metastasis. 2.  Perirectal/peritoneal nodes described on the prior diagnostic CT are less impressive today and may have undergone response to therapy.  Based on location, these were suspicious on the prior. 3.  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 Neoplasm cannot be excluded.  Consider further evaluation with ultrasound. 4.  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  Consider correlation with cardiac risk factors.  5.  Sinus disease.  Original Report Authenticated By: Consuello Bossier, M.D.     ASSESSMENT: 46 year old female  with:  #1. stage III adenocarcinoma of the rectum. Patient is now status post definitive surgery after having received neoadjuvant chemotherapy and radiation therapy. Her final pathology does not reveal any evidence of residual disease. 8 nodes were negative for metastatic disease.  #2 patient now begin adjuvant chemotherapy consisting of Xeloda and oxaliplatin This will be given every 21 days. Oxaliplatin will be on day 1. The Xeloda will be given on days 1 through 14 with 7 days off. Her dose is 1500 mg twice a day. Risks and benefits and side effects of treatment have been very clearly discussed with the patient and her family. They do understand that she will be receiving this for 6 cycles.  PLAN:  #1 we will proceed with her scheduled treatment today. Again side effects are explained very clearly to them. She is explained what to avoid. She also knows to call me with any problems questions or concerns.  #2 she will be seen back in 1 weeks' time to followup. However she knows  to call me sooner if need arises.  #3 we also discussed the type of foods she should be eating 2 avoid flatulence.  All questions were answered. The patient knows to call the clinic with any problems, questions or concerns. We can certainly see the patient much sooner if necessary.  I spent 30 minutes counseling the patient face to face. The total time spent in the appointment was 30 minutes.    Drue Second, MD Medical/Oncology [facility] [phone] (beeper) [phone] (Office)  03/05/2012, 9:03 AM

## 2012-03-06 ENCOUNTER — Telehealth: Payer: Self-pay | Admitting: Medical Oncology

## 2012-03-06 NOTE — Telephone Encounter (Signed)
Message copied by Charma Igo on Wed Mar 06, 2012  8:43 AM ------      Message from: Sherre Poot      Created: Tue Mar 05, 2012  1:48 PM       1st Oxaliplatin- Dr. Welton Flakes.  Patient had mild neuropathic symptoms at the end of treatment.

## 2012-03-06 NOTE — Telephone Encounter (Signed)
Spoke to daughter , Desmond Dike, and pt is doing find. She had a little bit of tingling with room temp drinks so she is warming them up . Daughter voices understanding to call for any concerns.

## 2012-03-07 ENCOUNTER — Telehealth: Payer: Self-pay | Admitting: Oncology

## 2012-03-07 ENCOUNTER — Telehealth: Payer: Self-pay | Admitting: *Deleted

## 2012-03-07 NOTE — Telephone Encounter (Signed)
Per staff message I have scheduled appts. JMW  

## 2012-03-07 NOTE — Telephone Encounter (Signed)
S/w the pt's husband and he is aware to pick up the pt's schedule for June-aug appts

## 2012-03-11 ENCOUNTER — Encounter: Payer: Self-pay | Admitting: *Deleted

## 2012-03-11 DIAGNOSIS — E860 Dehydration: Secondary | ICD-10-CM

## 2012-03-11 NOTE — Progress Notes (Signed)
Oxaliplatin on 6/11. Not due again until 7/2. Possible fluids on 06/18. IVF orders under sign and held.

## 2012-03-12 ENCOUNTER — Encounter: Payer: Self-pay | Admitting: Family

## 2012-03-12 ENCOUNTER — Ambulatory Visit (HOSPITAL_BASED_OUTPATIENT_CLINIC_OR_DEPARTMENT_OTHER): Payer: BC Managed Care – PPO

## 2012-03-12 ENCOUNTER — Other Ambulatory Visit (HOSPITAL_BASED_OUTPATIENT_CLINIC_OR_DEPARTMENT_OTHER): Payer: BC Managed Care – PPO | Admitting: Lab

## 2012-03-12 ENCOUNTER — Other Ambulatory Visit: Payer: Self-pay | Admitting: *Deleted

## 2012-03-12 ENCOUNTER — Telehealth: Payer: Self-pay | Admitting: *Deleted

## 2012-03-12 ENCOUNTER — Ambulatory Visit (HOSPITAL_BASED_OUTPATIENT_CLINIC_OR_DEPARTMENT_OTHER): Payer: BC Managed Care – PPO | Admitting: Family

## 2012-03-12 VITALS — BP 107/76 | HR 68 | Temp 98.3°F | Ht <= 58 in | Wt 90.0 lb

## 2012-03-12 DIAGNOSIS — E860 Dehydration: Secondary | ICD-10-CM

## 2012-03-12 DIAGNOSIS — C20 Malignant neoplasm of rectum: Secondary | ICD-10-CM

## 2012-03-12 DIAGNOSIS — Z8673 Personal history of transient ischemic attack (TIA), and cerebral infarction without residual deficits: Secondary | ICD-10-CM

## 2012-03-12 DIAGNOSIS — R197 Diarrhea, unspecified: Secondary | ICD-10-CM

## 2012-03-12 LAB — CBC WITH DIFFERENTIAL/PLATELET
BASO%: 0 % (ref 0.0–2.0)
LYMPH%: 20.1 % (ref 14.0–49.7)
MCHC: 35.1 g/dL (ref 31.5–36.0)
MONO#: 0.4 10*3/uL (ref 0.1–0.9)
RBC: 3.94 10*6/uL (ref 3.70–5.45)
WBC: 4.1 10*3/uL (ref 3.9–10.3)
lymph#: 0.8 10*3/uL — ABNORMAL LOW (ref 0.9–3.3)
nRBC: 0 % (ref 0–0)

## 2012-03-12 LAB — COMPREHENSIVE METABOLIC PANEL
ALT: 37 U/L — ABNORMAL HIGH (ref 0–35)
AST: 33 U/L (ref 0–37)
Calcium: 9.4 mg/dL (ref 8.4–10.5)
Chloride: 102 mEq/L (ref 96–112)
Creatinine, Ser: 0.67 mg/dL (ref 0.50–1.10)
Potassium: 3.6 mEq/L (ref 3.5–5.3)

## 2012-03-12 MED ORDER — SODIUM CHLORIDE 0.9 % IV SOLN
INTRAVENOUS | Status: DC
  Administered 2012-03-12: 12:00:00 via INTRAVENOUS

## 2012-03-12 NOTE — Telephone Encounter (Signed)
emailed michelle to set up treatment on 03-26-2012

## 2012-03-12 NOTE — Progress Notes (Signed)
OFFICE PROGRESS NOTE  CC: Renee Eva, MD Renee Herald, MD [name], MD [name], MD [name], MD  DIAGNOSIS:  Rectal carcinoma T3 N1 lesion.  PRIOR THERAPY:  1. Presented with a several month history of diarrhea and subsequent rectal bleeding. Found to be anemic with hemoglobin 10. Had colonoscopy performed which shwed multiple sessile polyps in the cecum, ascending, transverse and sigmoid colon and rectum. Rectum showed a large exophytic mass approximately 1 cm above the dentate line measuring 3-4 cm. Biopsy showed adenocarcinoma consistent with a rectal primary. 2. Evaluated by Dr. Carlynn Nicholson at [name] who performed a rectal ultrasound, T3 N1. It was recommended by Dr. [name] the patient undergo neoadjuvant chemotherapy and radiation prior to definitive surgery. 3. Completed concurrent radiation and chemotherapy with single agent xeloda administrated 08/22/11 - 10/09/2011.  4. Completed neoadjuvant treatment, PET scan shows no evidence of distant metastasis and peritoneal nodes less impressive, significant response to therapy. 5. 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 APC is negative 6 Had laparotomy with proctocolectomy, J-pouch with ileoanal anastomosis, loop ileostomy on 12/14/2011. Final pathology showed no evidence of residual disease. Complicated postop course including high output ileostomy, required intensive IV fluids on an outpatient basis to prevent dehydration.  CURRENT THERAPY: Adjuvant chemotherapy consisting of XELOX consisting of Xeloda and oxaliplatin on a 21 day cycle. IV Oxaliplatin will be on day 1, oral Xeloda on days 1 through 14 with 7 days off.   INTERVAL HISTORY: Chief complaint is diarrhea. Uses immodium after each diarrhea episode with good results. Weight is stable at 90 lbs. Experiencing taste changes. She  continues to consult with Scherrie Merritts regarding nutrition choices to maximize caloric intake and reduce diarrhea. She will meet with her again today in the infusion area. No nausea or vomiting. Ileostomy is functioning well. Is accompanied by an interpreter and her daughter.   ALLERGIES:   has no known allergies.  REVIEW OF SYSTEMS:  Pertinent items are noted in HPI.   PHYSICAL EXAMINATION:  General: Well developed, thin, but well nourished, in no acute distress. Understands [place] and replies in [place].   EENT: No ocular or oral lesions. No stomatitis.  Respiratory: Lungs are clear to auscultation bilaterally with normal respiratory movement and no accessory muscle use. Cardiac: No murmur, rub or tachycardia. No upper or lower extremity edema.  GI: Abdomen is soft, no palpable hepatosplenomegaly. No fluid wave. No tenderness.  In the right lower quadrant, an ileostomy appliance intact with liquid stool. Stoma is red, moist and protruding. No skin irritation surrounding the appliance.  Musculoskeletal: No kyphosis, no tenderness over the spine, ribs or hips. Lymph: No cervical, infraclavicular, axillary or inguinal adenopathy. Neuro: No focal neurological deficits. Psych: Alert and oriented X 3, appropriate mood and affect.   ECOG PERFORMANCE STATUS: 0 - Asymptomatic  Last menstrual period 02/20/2012.  LABORATORY DATA: Lab Results  Component Value Date   WBC 4.1 03/12/2012   HGB 11.4* 03/12/2012   HCT 32.5* 03/12/2012   MCV 82.5 03/12/2012   PLT 181 03/12/2012    ASSESSMENT: 46 year old female with: 1. Stage III adenocarcinoma of the rectum, on neoadjuvant therapy with good tolerance.  2. Diarrhea with intact ileostomy.   PLAN: 1. No treatment today.Is on oral Xeloda, next oxaliplatin will be 03/26/12. She will see me next week for clinic and lab check.  2. IV fluids today.  All questions were answered. The patient knows to call the clinic with any problems, questions or  concerns. We can certainly see the patient much sooner if necessary.

## 2012-03-12 NOTE — Telephone Encounter (Signed)
Per staff message I have scheduled appts. JMW  

## 2012-03-14 ENCOUNTER — Encounter: Payer: Self-pay | Admitting: *Deleted

## 2012-03-14 ENCOUNTER — Encounter (INDEPENDENT_AMBULATORY_CARE_PROVIDER_SITE_OTHER): Payer: Self-pay

## 2012-03-14 NOTE — Progress Notes (Signed)
RECEIVED A FAX FROM [facility] CONCERNING A CONFIRMATION OF PRESCRIPTION SHIPMENT FOR XELODA. 

## 2012-03-18 ENCOUNTER — Telehealth: Payer: Self-pay | Admitting: *Deleted

## 2012-03-18 NOTE — Telephone Encounter (Signed)
[facility] faxed a copy of the Pharmacy care plan to let us know about the services this patient is receiving from the clinical oncology pharmaacy team.  Pt. Receives xeloda.

## 2012-03-19 ENCOUNTER — Other Ambulatory Visit (HOSPITAL_BASED_OUTPATIENT_CLINIC_OR_DEPARTMENT_OTHER): Payer: BC Managed Care – PPO

## 2012-03-19 ENCOUNTER — Ambulatory Visit (HOSPITAL_BASED_OUTPATIENT_CLINIC_OR_DEPARTMENT_OTHER): Payer: BC Managed Care – PPO | Admitting: Family

## 2012-03-19 ENCOUNTER — Ambulatory Visit (HOSPITAL_BASED_OUTPATIENT_CLINIC_OR_DEPARTMENT_OTHER): Payer: BC Managed Care – PPO

## 2012-03-19 ENCOUNTER — Other Ambulatory Visit (HOSPITAL_BASED_OUTPATIENT_CLINIC_OR_DEPARTMENT_OTHER): Payer: BC Managed Care – PPO | Admitting: Lab

## 2012-03-19 ENCOUNTER — Ambulatory Visit: Payer: BC Managed Care – PPO

## 2012-03-19 ENCOUNTER — Encounter: Payer: Self-pay | Admitting: Family

## 2012-03-19 ENCOUNTER — Encounter: Payer: BC Managed Care – PPO | Admitting: Nutrition

## 2012-03-19 VITALS — BP 130/85 | HR 86

## 2012-03-19 VITALS — BP 118/73 | HR 76 | Temp 97.9°F | Ht <= 58 in | Wt 89.5 lb

## 2012-03-19 DIAGNOSIS — R300 Dysuria: Secondary | ICD-10-CM

## 2012-03-19 DIAGNOSIS — R197 Diarrhea, unspecified: Secondary | ICD-10-CM

## 2012-03-19 DIAGNOSIS — C20 Malignant neoplasm of rectum: Secondary | ICD-10-CM

## 2012-03-19 DIAGNOSIS — Z452 Encounter for adjustment and management of vascular access device: Secondary | ICD-10-CM

## 2012-03-19 DIAGNOSIS — L270 Generalized skin eruption due to drugs and medicaments taken internally: Secondary | ICD-10-CM

## 2012-03-19 DIAGNOSIS — E860 Dehydration: Secondary | ICD-10-CM

## 2012-03-19 DIAGNOSIS — R110 Nausea: Secondary | ICD-10-CM

## 2012-03-19 LAB — CBC WITH DIFFERENTIAL/PLATELET
BASO%: 0.2 % (ref 0.0–2.0)
EOS%: 9.8 % — ABNORMAL HIGH (ref 0.0–7.0)
HCT: 32.5 % — ABNORMAL LOW (ref 34.8–46.6)
LYMPH%: 16.4 % (ref 14.0–49.7)
MCH: 29.2 pg (ref 25.1–34.0)
MCHC: 35.7 g/dL (ref 31.5–36.0)
MCV: 81.9 fL (ref 79.5–101.0)
MONO%: 9.2 % (ref 0.0–14.0)
NEUT%: 64.4 % (ref 38.4–76.8)
lymph#: 0.9 10*3/uL (ref 0.9–3.3)

## 2012-03-19 LAB — URINALYSIS, MICROSCOPIC - CHCC
Bilirubin (Urine): NEGATIVE
Ketones: NEGATIVE mg/dL
Protein: <30 mg/dL
Specific Gravity, Urine: 1.015 (ref 1.003–1.035)
pH: 6 (ref 4.6–8.0)

## 2012-03-19 LAB — COMPREHENSIVE METABOLIC PANEL
AST: 31 U/L (ref 0–37)
Alkaline Phosphatase: 78 U/L (ref 39–117)
BUN: 11 mg/dL (ref 6–23)
Creatinine, Ser: 0.69 mg/dL (ref 0.50–1.10)
Potassium: 3.7 mEq/L (ref 3.5–5.3)
Total Bilirubin: 0.7 mg/dL (ref 0.3–1.2)

## 2012-03-19 MED ORDER — ALTEPLASE 2 MG IJ SOLR
2.0000 mg | Freq: Once | INTRAMUSCULAR | Status: AC
  Administered 2012-03-19: 2 mg
  Filled 2012-03-19: qty 2

## 2012-03-19 MED ORDER — SODIUM CHLORIDE 0.9 % IV SOLN
1000.0000 mL | INTRAVENOUS | Status: AC
  Administered 2012-03-19: 1000 mL via INTRAVENOUS

## 2012-03-19 NOTE — Progress Notes (Signed)
OFFICE PROGRESS NOTE  CC: Renee Eva, MD Renee Herald, MD [name], MD [name], MD [name], MD  DIAGNOSIS:  Rectal carcinoma T3 N1 lesion.  PRIOR THERAPY:  1. Presented with a several month history of diarrhea and subsequent rectal bleeding. Found to be anemic with hemoglobin 10. Had colonoscopy performed which shwed multiple sessile polyps in the cecum, ascending, transverse and sigmoid colon and rectum. Rectum showed a large exophytic mass approximately 1 cm above the dentate line measuring 3-4 cm. Biopsy showed adenocarcinoma consistent with a rectal primary. 2. Evaluated by Dr. Carlynn Nicholson at [facility] who performed a rectal ultrasound, T3 N1. It was recommended by Dr. [name] the patient undergo neoadjuvant chemotherapy and radiation prior to definitive surgery. 3. Completed concurrent radiation and chemotherapy with single agent xeloda administrated 08/22/11 - 10/09/2011.  4. Completed neoadjuvant treatment, PET scan shows no evidence of distant metastasis and peritoneal nodes less impressive, significant response to therapy. 5. 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 APC is negative 6 Had laparotomy with proctocolectomy, J-pouch with ileoanal anastomosis, loop ileostomy on 12/14/2011. Final pathology showed no evidence of residual disease. Complicated postop course including high output ileostomy, required intensive IV fluids on an outpatient basis to prevent dehydration.  CURRENT THERAPY: Adjuvant chemotherapy consisting of XELOX consisting of Xeloda and oxaliplatin on a 21 day cycle. IV Oxaliplatin will be on day 1, oral Xeloda on days 1 through 14 with 7 days off.   INTERVAL HISTORY: Chief complaint is diarrhea. Uses immodium after each diarrhea episode with incomplete results. Has lost 1 lb, weight 89 lbs. Experiencing taste  changes. She continues to consult with Scherrie Merritts regarding nutrition choices to maximize caloric intake and reduce diarrhea. She requests IV fluids today. Is trying to maintain adequate fluid intake. Mild nausea, no vomiting. Ileostomy is functioning well. Is accompanied by an interpreter and her daughter.   Complains of pain and skin changes bilateral soles of feet and palms of hands. Has been using Vaseline several times a day. Also reports burning on urination, applies Vaseline vaginally. Complains of mild mucositis. No headache or blurred vision. No cough or shortness of breath. No abdominal pain or new bone pain. Bladder function is normal. Remainder of the 10 point  review of systems is negative.  ALLERGIES:   has no known allergies.  REVIEW OF SYSTEMS:  Pertinent items are noted in HPI.   PHYSICAL EXAMINATION:  General: Well developed, thin, but well nourished, in no acute distress. Understands [place] and replies in [place].   EENT: No ocular or oral lesions. No stomatitis.  Respiratory: Lungs are clear to auscultation bilaterally with normal respiratory movement and no accessory muscle use. Cardiac: No murmur, rub or tachycardia. No upper or lower extremity edema.  GI: Abdomen is soft, no palpable hepatosplenomegaly. No fluid wave. No tenderness.  In the right lower quadrant, an ileostomy appliance intact with liquid stool. Stoma is red, moist and protruding. No skin irritation surrounding the appliance.  Musculoskeletal: No kyphosis, no tenderness over the spine, ribs or hips. Lymph: No cervical, infraclavicular, axillary or inguinal adenopathy. Neuro: No focal neurological deficits. Psych: Alert and oriented X 3, appropriate mood and affect.   ECOG PERFORMANCE STATUS: 0 - Asymptomatic  Last menstrual period 02/20/2012.  LABORATORY DATA: Lab Results  Component Value Date   WBC 4.1 03/12/2012   HGB 11.4* 03/12/2012   HCT 32.5* 03/12/2012  MCV 82.5 03/12/2012   PLT 181  03/12/2012    ASSESSMENT: 46 year old female with: 1. Stage III adenocarcinoma of the rectum, on neoadjuvant therapy with good tolerance.  2. Diarrhea with intact ileostomy.  3. Mild nausea. 4. Hand/foot syndrome related to Xeloda. 5. Burning on urination.   PLAN: 1. No treatment today. Has completed cycle of oral Xeloda, will now have 1 week off. Next oxaliplatin will be 03/26/12. She will see Dr. Welton Flakes next week prior to next oxaliplatin treatment with lab check.  2. IV fluids today.  3. UA 4. Discontinue Vaseline vaginally.  5. Do not restart Xeloda until seen by Dr. Welton Flakes 03/26/12.   She is seen and examined by Dr. Welton Flakes who endorses the plan.   All questions were answered. The patient knows to call the clinic with any problems, questions or concerns.

## 2012-03-22 ENCOUNTER — Ambulatory Visit: Payer: BC Managed Care – PPO | Admitting: Oncology

## 2012-03-22 ENCOUNTER — Encounter (HOSPITAL_COMMUNITY): Payer: Self-pay

## 2012-03-22 ENCOUNTER — Ambulatory Visit (HOSPITAL_BASED_OUTPATIENT_CLINIC_OR_DEPARTMENT_OTHER): Payer: BC Managed Care – PPO | Admitting: Lab

## 2012-03-22 ENCOUNTER — Ambulatory Visit: Payer: BC Managed Care – PPO

## 2012-03-22 ENCOUNTER — Inpatient Hospital Stay (HOSPITAL_COMMUNITY)
Admission: AD | Admit: 2012-03-22 | Discharge: 2012-03-26 | DRG: 182 | Disposition: A | Discharge status: Home / Self Care | Payer: BC Managed Care – PPO | Source: Ambulatory Visit | Attending: Oncology | Admitting: Oncology

## 2012-03-22 ENCOUNTER — Other Ambulatory Visit: Payer: Self-pay | Admitting: *Deleted

## 2012-03-22 ENCOUNTER — Telehealth: Payer: Self-pay | Admitting: *Deleted

## 2012-03-22 VITALS — BP 120/83 | HR 101 | Temp 97.3°F | Ht <= 58 in | Wt 88.5 lb

## 2012-03-22 DIAGNOSIS — E860 Dehydration: Secondary | ICD-10-CM | POA: Diagnosis present

## 2012-03-22 DIAGNOSIS — R197 Diarrhea, unspecified: Principal | ICD-10-CM | POA: Diagnosis present

## 2012-03-22 DIAGNOSIS — C20 Malignant neoplasm of rectum: Secondary | ICD-10-CM

## 2012-03-22 DIAGNOSIS — E876 Hypokalemia: Secondary | ICD-10-CM | POA: Diagnosis present

## 2012-03-22 DIAGNOSIS — Z9221 Personal history of antineoplastic chemotherapy: Secondary | ICD-10-CM

## 2012-03-22 DIAGNOSIS — F411 Generalized anxiety disorder: Secondary | ICD-10-CM | POA: Diagnosis present

## 2012-03-22 DIAGNOSIS — L270 Generalized skin eruption due to drugs and medicaments taken internally: Secondary | ICD-10-CM | POA: Diagnosis present

## 2012-03-22 DIAGNOSIS — R112 Nausea with vomiting, unspecified: Secondary | ICD-10-CM | POA: Diagnosis present

## 2012-03-22 DIAGNOSIS — Z932 Ileostomy status: Secondary | ICD-10-CM

## 2012-03-22 DIAGNOSIS — R627 Adult failure to thrive: Secondary | ICD-10-CM | POA: Diagnosis present

## 2012-03-22 DIAGNOSIS — Z923 Personal history of irradiation: Secondary | ICD-10-CM

## 2012-03-22 DIAGNOSIS — T451X5A Adverse effect of antineoplastic and immunosuppressive drugs, initial encounter: Secondary | ICD-10-CM | POA: Diagnosis present

## 2012-03-22 DIAGNOSIS — E079 Disorder of thyroid, unspecified: Secondary | ICD-10-CM

## 2012-03-22 DIAGNOSIS — R198 Other specified symptoms and signs involving the digestive system and abdomen: Secondary | ICD-10-CM

## 2012-03-22 LAB — CBC
MCH: 29.3 pg (ref 26.0–34.0)
MCV: 83 fL (ref 78.0–100.0)
Platelets: 239 10*3/uL (ref 150–400)
RBC: 4 MIL/uL (ref 3.87–5.11)

## 2012-03-22 LAB — BASIC METABOLIC PANEL
CO2: 27 mEq/L (ref 19–32)
Calcium: 8.7 mg/dL (ref 8.4–10.5)
Chloride: 96 mEq/L (ref 96–112)
Sodium: 133 mEq/L — ABNORMAL LOW (ref 135–145)

## 2012-03-22 MED ORDER — SODIUM CHLORIDE 0.9 % IV SOLN
INTRAVENOUS | Status: DC
  Administered 2012-03-22 – 2012-03-23 (×2): via INTRAVENOUS

## 2012-03-22 MED ORDER — SIMETHICONE 80 MG PO CHEW
80.0000 mg | CHEWABLE_TABLET | Freq: Four times a day (QID) | ORAL | Status: DC | PRN
  Filled 2012-03-22: qty 1

## 2012-03-22 MED ORDER — OXYCODONE HCL 5 MG PO TABS
5.0000 mg | ORAL_TABLET | Freq: Four times a day (QID) | ORAL | Status: DC | PRN
  Administered 2012-03-22 – 2012-03-24 (×4): 5 mg via ORAL
  Filled 2012-03-22 (×4): qty 1

## 2012-03-22 MED ORDER — HYDROCODONE-ACETAMINOPHEN 5-325 MG PO TABS: 1.0000 | ORAL_TABLET | Freq: Four times a day (QID) | ORAL | Status: DC | PRN

## 2012-03-22 MED ORDER — ACETAMINOPHEN 650 MG RE SUPP: 650.0000 mg | Freq: Four times a day (QID) | RECTAL | Status: DC | PRN

## 2012-03-22 MED ORDER — MEGESTROL ACETATE 400 MG/10ML PO SUSP
400.0000 mg | Freq: Two times a day (BID) | ORAL | Status: DC
  Administered 2012-03-22 – 2012-03-26 (×4): 400 mg via ORAL
  Filled 2012-03-22 (×9): qty 10

## 2012-03-22 MED ORDER — LOPERAMIDE HCL 2 MG PO TABS: 4.0000 mg | ORAL_TABLET | Freq: Three times a day (TID) | ORAL | Status: DC | PRN

## 2012-03-22 MED ORDER — ACETAMINOPHEN 325 MG PO TABS: 650.0000 mg | ORAL_TABLET | Freq: Four times a day (QID) | ORAL | Status: DC | PRN

## 2012-03-22 MED ORDER — ENOXAPARIN SODIUM 40 MG/0.4ML ~~LOC~~ SOLN
30.0000 mg | SUBCUTANEOUS | Status: DC
  Administered 2012-03-22 – 2012-03-23 (×2): 30 mg via SUBCUTANEOUS
  Filled 2012-03-22 (×4): qty 0.4

## 2012-03-22 MED ORDER — HYDROCODONE-ACETAMINOPHEN 5-500 MG PO TABS
1.0000 | ORAL_TABLET | Freq: Four times a day (QID) | ORAL | Status: DC | PRN
Start: 2012-03-22 — End: 2012-03-22

## 2012-03-22 MED ORDER — ONDANSETRON 16 MG/50ML IVPB (CHCC): 16.0000 mg | Freq: Once | INTRAVENOUS | Status: DC

## 2012-03-22 MED ORDER — LOPERAMIDE HCL 2 MG PO CAPS
4.0000 mg | ORAL_CAPSULE | ORAL | Status: DC | PRN
  Filled 2012-03-22: qty 1

## 2012-03-22 MED ORDER — LIDOCAINE-PRILOCAINE 2.5-2.5 % EX CREA
TOPICAL_CREAM | CUTANEOUS | Status: DC | PRN
  Administered 2012-03-22: 16:00:00 via TOPICAL
  Filled 2012-03-22: qty 5

## 2012-03-22 MED ORDER — PROCHLORPERAZINE MALEATE 10 MG PO TABS
10.0000 mg | ORAL_TABLET | Freq: Four times a day (QID) | ORAL | Status: DC | PRN
  Administered 2012-03-22 – 2012-03-23 (×2): 10 mg via ORAL
  Filled 2012-03-22 (×2): qty 1

## 2012-03-22 MED ORDER — POTASSIUM CHLORIDE 10 MEQ/100ML IV SOLN
10.0000 meq | INTRAVENOUS | Status: AC
  Administered 2012-03-22 (×5): 10 meq via INTRAVENOUS
  Filled 2012-03-22 (×5): qty 100

## 2012-03-22 MED ORDER — PROCHLORPERAZINE 25 MG RE SUPP
25.0000 mg | Freq: Two times a day (BID) | RECTAL | Status: DC | PRN
Start: 2012-03-22 — End: 2012-03-26
  Filled 2012-03-22: qty 1

## 2012-03-22 MED ORDER — PANTOPRAZOLE SODIUM 40 MG PO TBEC
40.0000 mg | DELAYED_RELEASE_TABLET | Freq: Every day | ORAL | Status: DC
  Administered 2012-03-22: 40 mg via ORAL
  Filled 2012-03-22 (×2): qty 1

## 2012-03-22 MED ORDER — ENSURE COMPLETE PO LIQD
237.0000 mL | Freq: Three times a day (TID) | ORAL | Status: DC
  Administered 2012-03-22 (×2): 237 mL via ORAL

## 2012-03-22 MED ORDER — PROMETHAZINE HCL 25 MG PO TABS
12.5000 mg | ORAL_TABLET | Freq: Four times a day (QID) | ORAL | Status: DC | PRN
  Administered 2012-03-22: 12.5 mg via ORAL
  Filled 2012-03-22: qty 1

## 2012-03-22 MED ORDER — ZOLPIDEM TARTRATE 5 MG PO TABS
5.0000 mg | ORAL_TABLET | Freq: Every evening | ORAL | Status: DC | PRN
  Administered 2012-03-22 – 2012-03-25 (×4): 5 mg via ORAL
  Filled 2012-03-22 (×4): qty 1

## 2012-03-22 MED ORDER — ALUM & MAG HYDROXIDE-SIMETH 200-200-20 MG/5ML PO SUSP
30.0000 mL | Freq: Four times a day (QID) | ORAL | Status: DC | PRN
  Administered 2012-03-23: 30 mL via ORAL
  Filled 2012-03-22: qty 30

## 2012-03-22 MED ORDER — SODIUM CHLORIDE 0.9 % IV SOLN: 1000.0000 mL | INTRAVENOUS | Status: DC

## 2012-03-22 NOTE — Progress Notes (Signed)
Patient was admitted to the hospital directly from the clinic today please see dictated history and physical for admission.

## 2012-03-22 NOTE — Patient Instructions (Addendum)
Admit to the hospital

## 2012-03-22 NOTE — H&P (Signed)
Renee Nicholson [id number] 02-22-1966 45 y.o. 03/22/2012 1:34 PM  CC: Dr. [name]  Chief Complaint: 46 year old female with stage IIIB rectal carcinoma receiving adjuvant chemotherapy consisting of Xeloda oxaliplatinum status post one cycle patient being admitted with diarrhea dehydration weakness fatigue failure to thrive secondary to recent chemotherapy and cancer.  HPI:  Patient is a very pleasant 46 year old female who originally presented in 2012 with rectal cancer abdominal pain. She eventually was found to have a stage IIIB (T3, N1, M0) rectal carcinoma. She initially underwent chemoradiation therapy. The chemotherapy consisted of single agents a load of given orally with radiation therapy. She then went on to have proctocolectomy, J-pouch with ileoanal anastomosis, loop ileostomy on 12/14/2011. Final pathology did not reveal any evidence of residual disease. She was then started on adjuvant chemotherapy consisting of oxaliplatin and Xeloda given on a 21 day cycle. She recently completed 2 weeks of so low to. The patient was seen last week she developed hand-foot syndrome. Patient's daughter called [place] this morning stating that patient was dehydrated she was having severe diarrhea weakness fatigue nausea but no vomiting. Patient was dose brought into the cancer Center. Her pouch revealed diarrheal stool patient was very fatigued weak pale. It was best recommended that patient be admitted for IV fluids control of her nausea and diarrhea. Patient could possibly have C. Difficile.  PMH: Past Medical History  Diagnosis Date  . Anemia   . Diarrhea   . Rectal cancer 08/16/2011  . Colon cancer   . Anxiety   . Bright red rectal bleeding 09/13/2011  . History of chemotherapy     completed 09/2011   . Hx of radiation therapy 08/29/11 to 10/09/11    rectum    Past Surgical History  Procedure Date  . Tubal ligation   . Carpal tunnel release   . Colonoscopy 07/26/2011    Procedure: COLONOSCOPY;   Surgeon: Arlyce Harman, MD;  Location: [facility];  Service: Endoscopy;  Laterality: N/A;  10:40  . Esophagogastroduodenoscopy 07/26/2011    Procedure: ESOPHAGOGASTRODUODENOSCOPY (EGD);  Surgeon: Arlyce Harman, MD;  Location: [facility];  Service: Endoscopy;  Laterality: N/A;  . Esophagogastroduodenoscopy 12/27/2011    Procedure: ESOPHAGOGASTRODUODENOSCOPY (EGD);  Surgeon: Shirley Friar, MD;  Location: [facility];  Service: Endoscopy;  Laterality: N/A;  . Laparoscopic colon resection 12/14/11    diverting ileostomy  . Portacath placement 02/21/2012    Procedure: INSERTION PORT-A-CATH;  Surgeon: [name], MD;  Location: [facility] OR;  Service: General;  Laterality: N/A;    Allergies: No Known Allergies  Medications: Medications Prior to Admission  Medication Sig Dispense Refill  . acetaminophen (TYLENOL) 325 MG tablet Take 2 tablets (650 mg total) by mouth every 4 (four) hours as needed.  200 tablet  1  . dexamethasone (DECADRON) 4 MG tablet Take 2 tablets by mouth daily starting the day after chemotherapy for 2 days. Take with food.  30 tablet  1  . feeding supplement (ENSURE COMPLETE) LIQD Take 237 mLs by mouth 3 (three) times daily between meals.  50 Bottle  6  . Ferrous Sulfate (IRON) 28 MG TABS Take 1 tablet by mouth daily.      . Heparin Lock Flush (HEPARIN FLUSH, PORCINE,) 100 UNIT/ML injection 1 mL (100 Units total) by Intracatheter route as needed (250 units per lumen).  5 Syringe  3  . HYDROcodone-acetaminophen (VICODIN) 5-500 MG per tablet Take 1-2 tablets by mouth every 6 (six) hours as needed for pain.  30 tablet  0  . lidocaine-prilocaine (EMLA) cream Apply topically as needed.  30 g  6  . loperamide (IMODIUM) 2 MG capsule Take 2 capsules by mouth Every 4 hours as needed.      . megestrol (MEGACE) 400 MG/10ML suspension Take 10 mLs (400 mg total) by mouth 2 (two) times daily.  240 mL  1  . Multiple Vitamin (MULTIVITAMIN) tablet Take 1 tablet by mouth every morning.        . pantoprazole (PROTONIX) 40 MG tablet Take 40 mg by mouth daily.      . polysaccharide iron (NIFEREX) 150 MG CAPS capsule Take 150 mg by mouth every morning.       . potassium chloride (K-DUR,KLOR-CON) 20 MEQ tablet Take 2 tablets (40 mEq total) by mouth 2 (two) times daily.  60 tablet  1  . prochlorperazine (COMPAZINE) 10 MG tablet Take 1 tablet (10 mg total) by mouth every 6 (six) hours as needed (Nausea or vomiting).  30 tablet  1  . prochlorperazine (COMPAZINE) 25 MG suppository Place 1 suppository (25 mg total) rectally every 12 (twelve) hours as needed for nausea.  12 suppository  3  . simethicone (MYLICON) 80 MG chewable tablet Chew 80 mg by mouth every 6 (six) hours as needed. For gas        Social History:   reports that she has never smoked. She has never used smokeless tobacco. She reports that she does not drink alcohol or use illicit drugs.  Family History: Family History  Problem Relation Age of Onset  . Diabetes Mother   . Colon cancer Neg Hx   . Cancer Brother 35    rectal cancer lives in [place]    Review of Systems: Constitutional ROS: Fever No fevers, Chills, Night Sweats, Anorexia, Pain Abdominal pain is noted  With back pain without any radiation Cardiovascular ROS: positive for - dyspnea on exertion and palpitations Respiratory ROS: positive for - shortness of breath Neurological ROS: no TIA or stroke symptoms Dermatological ROS: positive for skin lesion changes ENT ROS: positive for - oral lesions and vocal changes Gastrointestinal ROS: positive for - abdominal pain, appetite loss, change in bowel habits, change in stools, diarrhea, gas/bloating, heartburn and nausea/vomiting Genito-Urinary ROS: no dysuria, trouble voiding, or hematuria Hematological and Lymphatic ROS: negative Breast ROS: negative for breast lumps Musculoskeletal ROS: positive for - muscle pain and muscular weakness Remaining ROS negative.  Physical Exam: Blood pressure 120/82, pulse 91,  temperature 97.6 F (36.4 C), temperature source Axillary, resp. rate 18, height 4\' 9"  (1.448 m), weight 88 lb 8 oz (40.143 kg), last menstrual period 02/20/2012.  General appearance: alert, cooperative, fatigued, flushed, moderate distress and pale Resp: clear to auscultation bilaterally Cardio: regular rate and rhythm, S1, S2 normal, no murmur, click, rub or gallop and tachycardia GI: abdomen is mildly tender in the gastrium ostomy bag has very loose diarrheal stool no blood no hepatosplenomegaly Extremities: extremities normal, atraumatic, no cyanosis or edema back patient does have lower back pain without radiation Neuro patient's alert oriented x3 strength is symmetrical.  Skin hands and feet reveal darkening of the skin of the palms and the soles.   Lab Results: Lab Results  Component Value Date   WBC 5.3 03/19/2012   HGB 11.6 03/19/2012   HCT 32.5* 03/19/2012   MCV 81.9 03/19/2012   PLT 247 03/19/2012   Lab Results  Component Value Date   WBC 5.3 03/19/2012   HGB 11.6 03/19/2012   HCT 32.5* 03/19/2012  PLT 247 03/19/2012   GLUCOSE 140* 03/22/2012   ALT 35 03/19/2012   AST 31 03/19/2012   NA 133* 03/22/2012   K 2.6* 03/22/2012   CL 96 03/22/2012   CREATININE 0.78 03/22/2012   BUN 8 03/22/2012   CO2 27 03/22/2012   TSH 1.018 07/17/2011   INR 1.53* 01/03/2012    Radiological Studies: No results found.   Impression and Plan:  46 year old female with stage IIIB rectal carcinoma status post neoadjuvant chemoradiation therapy followed by proctocolectomy with J. Pouch and end of Xeloda and oxaliplatin. Patient is being admitted with severe diarrhea hand-foot syndrome dehydration nausea along vomiting due to her chemotherapy.  #1 patient will receive IV fluids with normal saline this will be continued throughout her hospitalization.  #2 for nausea patient will receive anti-emetics consisting of Zofran and Compazine as needed.  #3 patient will begin Imodium after she has her stools  checked for C. Difficile.  #4 hypokalemia patient will begin potassium infusions.  #5 we will ask nutrition to see her to encourage her to begin eating more.  #6 patient is a full code and requires hospitalization throughout the weekend due to her over all poor status at this time.   Drue Second, MD Medical/Oncology [facility] [phone] (beeper) [phone] (Office)  03/22/2012, 1:46 PM

## 2012-03-22 NOTE — Telephone Encounter (Signed)
Pt's daughter called . Pt n/v with back pain. Relayed a heating pad is used to help back pain. Denied using pain medicaiton. Pt has a rx vicodin. Reviewed with MD- VO for pt to come in for Lab- BMET, MD visit and IV Fluids\ Onc tx sent

## 2012-03-23 LAB — COMPREHENSIVE METABOLIC PANEL
ALT: 25 U/L (ref 0–35)
AST: 19 U/L (ref 0–37)
CO2: 24 mEq/L (ref 19–32)
Chloride: 102 mEq/L (ref 96–112)
Creatinine, Ser: 0.52 mg/dL (ref 0.50–1.10)
GFR calc non Af Amer: >90 mL/min (ref >90)
Sodium: 133 mEq/L — ABNORMAL LOW (ref 135–145)
Total Bilirubin: 0.7 mg/dL (ref 0.3–1.2)

## 2012-03-23 LAB — CBC
HCT: 30.3 % — ABNORMAL LOW (ref 36.0–46.0)
Hemoglobin: 10.6 g/dL — ABNORMAL LOW (ref 12.0–15.0)
MCH: 29.5 pg (ref 26.0–34.0)
MCV: 84.4 fL (ref 78.0–100.0)
RBC: 3.59 MIL/uL — ABNORMAL LOW (ref 3.87–5.11)

## 2012-03-23 LAB — BASIC METABOLIC PANEL
CO2: 21 mEq/L (ref 19–32)
Calcium: 8.2 mg/dL — ABNORMAL LOW (ref 8.4–10.5)
Chloride: 98 mEq/L (ref 96–112)
Creatinine, Ser: 0.56 mg/dL (ref 0.50–1.10)
Creatinine, Ser: 0.52 mg/dL (ref 0.50–1.10)
GFR calc Af Amer: >90 mL/min (ref >90)
Glucose, Bld: 117 mg/dL — ABNORMAL HIGH (ref 70–99)
Potassium: 2.7 mEq/L — CL (ref 3.5–5.1)
Sodium: 131 mEq/L — ABNORMAL LOW (ref 135–145)

## 2012-03-23 LAB — CLOSTRIDIUM DIFFICILE BY PCR: Toxigenic C. Difficile by PCR: NEGATIVE

## 2012-03-23 MED ORDER — BOOST / RESOURCE BREEZE PO LIQD
1.0000 | Freq: Three times a day (TID) | ORAL | Status: DC
  Administered 2012-03-23 – 2012-03-26 (×6): 1 via ORAL

## 2012-03-23 MED ORDER — VITAMINS A & D EX OINT
TOPICAL_OINTMENT | CUTANEOUS | Status: AC
  Administered 2012-03-23: 13:00:00
  Filled 2012-03-23: qty 5

## 2012-03-23 MED ORDER — POTASSIUM CHLORIDE 10 MEQ/50ML IV SOLN
10.0000 meq | INTRAVENOUS | Status: AC
  Administered 2012-03-23 (×6): 10 meq via INTRAVENOUS
  Filled 2012-03-23 (×6): qty 50

## 2012-03-23 MED ORDER — OCTREOTIDE ACETATE 100 MCG/ML IJ SOLN
100.0000 ug | Freq: Three times a day (TID) | INTRAMUSCULAR | Status: DC
  Administered 2012-03-24 (×2): 100 ug via SUBCUTANEOUS
  Filled 2012-03-23 (×5): qty 1

## 2012-03-23 MED ORDER — SODIUM CHLORIDE 0.9 % IV SOLN
INTRAVENOUS | Status: DC
  Administered 2012-03-23 – 2012-03-24 (×2): via INTRAVENOUS
  Filled 2012-03-23 (×5): qty 1000

## 2012-03-23 MED ORDER — PANTOPRAZOLE SODIUM 40 MG IV SOLR
40.0000 mg | Freq: Every day | INTRAVENOUS | Status: DC
  Administered 2012-03-24 – 2012-03-25 (×2): 40 mg via INTRAVENOUS
  Filled 2012-03-23 (×4): qty 40

## 2012-03-23 MED ORDER — OCTREOTIDE ACETATE 50 MCG/ML IJ SOLN
100.0000 ug | Freq: Three times a day (TID) | INTRAMUSCULAR | Status: DC
  Administered 2012-03-23: 100 ug via SUBCUTANEOUS
  Filled 2012-03-23 (×3): qty 2

## 2012-03-23 MED ORDER — LOPERAMIDE HCL 2 MG PO CAPS
2.0000 mg | ORAL_CAPSULE | ORAL | Status: DC
  Administered 2012-03-23 – 2012-03-26 (×17): 2 mg via ORAL
  Filled 2012-03-23 (×27): qty 1

## 2012-03-23 MED ORDER — GATORADE (BH)
240.0000 mL | Freq: Four times a day (QID) | ORAL | Status: DC
  Administered 2012-03-23 (×2): 240 mL via ORAL
  Filled 2012-03-23: qty 480

## 2012-03-23 NOTE — Progress Notes (Signed)
C. Diff was negative.  Isolation precautions d/c'ed per protocol.

## 2012-03-23 NOTE — Progress Notes (Signed)
  03/23/2012, 9:00 AM  Hospital day: 2 Antibiotics: none Chemotherapy: oxaliplatin 03-05-12, Xeloda x 14 days completed shortly prior to admission  Subjective: Patient seen with daughter at bedside; discussed with RN separately. Still watery diarrhea, x6 ostomy bags yesterday, x2 overnight and bag nearly full now. Some nausea, vomited yesterday. Drinking water, still cold sensitivity. Will give clear liquids only until GI symptoms improve; try gatorade and resource. Some abdominal cramping. No SOB. No bleeding. Able to get to BR without problems.  Objective: Vital signs in last 24 hours: Blood pressure 109/74, pulse 95, temperature 98.8 F (37.1 C), temperature source Axillary, resp. rate 20, height 4\' 9"  (1.448 m), weight 89 lb 4.6 oz (40.5 kg), last menstrual period 02/20/2012, SpO2 100.00%. Mouth dry without ulcers or thrush. PERRL, not icteric. PAC site ok. Lungs without wheezes or rales, resp not labored RA. Cor RRR, no gallop. Abdomen some BS, soft, slightly tender diffusely. Clear watery liquid in ostomy bag, no particles. LE no edema, cords. PAS on bed. Moves extremities easily.   Intake/Output from previous day: 06/28 0701 - 06/29 0700 In: 180 [P.O.:180] Out: 550 [Stool:550] Intake/Output this shift: Total I/O In: 1792 [I.V.:1792] Out: -   Discussed with pharmacist: will change maintenance IVF to NS with 40 K and 2 gm magnesium at 125, in addition to 6 runs KCl  Lab Results:  Basename 03/22/12 1430  WBC 3.4*  HGB 11.7*  HCT 33.2*  PLT 239   BMET  Basename 03/23/12 0520 03/22/12 1430 03/22/12 1154  NA 133* -- 133*  K 2.7* -- 2.6*  CL 102 -- 96  CO2 24 -- 27  GLUCOSE 107* -- 140*  BUN 4* -- 8  CREATININE 0.52 0.73 --  CALCIUM 8.1* -- 8.7   Magnesium today 1.0  C diff still in process Studies/Results: No results found.   Assessment/Plan: 1.continued diarrhea with hypokalemia and hypomagnesemia: likely related to chemotherapy, but C diff pending. Continue IVF  now with K and Mg, + 6 runs IV K and follow up BMET. If C diff negative today, will begin sandostatin IV + imodium. Daily chemistries. 2.rectal cancer, IIIB at diagnosis 2012, treated to date with initial chemoRT, surgery with ileostomy (final path no residual) and now oxaliplatin + xeloda. 3.PAC in  Nagi Furio P

## 2012-03-23 NOTE — Progress Notes (Addendum)
Patient has had copious amounts of output per iileostomy, that looks like water just mildly tinted with stool (greenish in color).  Patient emptied own ostomy bag 3 times in 6 hours with mostly watery contents.  Volume not captured in I/O.  Educated patient that from this point onward, output should be measured, including urine.  Patient verbalized understanding, and agreed to call when she uses the commode or empties her bag.  Will continue to monitor.

## 2012-03-23 NOTE — Progress Notes (Signed)
Pt's K was 2.7. Paged MD (oncall). Awaiting call back. Cont to monitor pt. V/s wnl.

## 2012-03-24 LAB — CBC WITH DIFFERENTIAL/PLATELET
Basophils Relative: 1 % (ref 0–1)
Eosinophils Relative: 6 % — ABNORMAL HIGH (ref 0–5)
HCT: 29.2 % — ABNORMAL LOW (ref 36.0–46.0)
Lymphs Abs: 0.9 10*3/uL (ref 0.7–4.0)
MCV: 84.9 fL (ref 78.0–100.0)
Monocytes Relative: 29 % — ABNORMAL HIGH (ref 3–12)
Neutro Abs: 1.7 10*3/uL (ref 1.7–7.7)
RBC: 3.44 MIL/uL — ABNORMAL LOW (ref 3.87–5.11)
RDW: 14.5 % (ref 11.5–15.5)
WBC: 3.9 10*3/uL — ABNORMAL LOW (ref 4.0–10.5)

## 2012-03-24 LAB — COMPREHENSIVE METABOLIC PANEL
ALT: 26 U/L (ref 0–35)
AST: 21 U/L (ref 0–37)
CO2: 23 mEq/L (ref 19–32)
Calcium: 8.4 mg/dL (ref 8.4–10.5)
GFR calc non Af Amer: >90 mL/min (ref >90)
Sodium: 136 mEq/L (ref 135–145)

## 2012-03-24 LAB — URINALYSIS, ROUTINE W REFLEX MICROSCOPIC
Glucose, UA: NEGATIVE mg/dL
Ketones, ur: 40 mg/dL — AB
Protein, ur: NEGATIVE mg/dL
pH: 5.5 (ref 5.0–8.0)

## 2012-03-24 LAB — URINE MICROSCOPIC-ADD ON

## 2012-03-24 MED ORDER — OCTREOTIDE ACETATE 100 MCG/ML IJ SOLN
150.0000 ug | Freq: Three times a day (TID) | INTRAMUSCULAR | Status: DC
  Administered 2012-03-24 – 2012-03-25 (×4): 150 ug via SUBCUTANEOUS
  Filled 2012-03-24 (×10): qty 1.5

## 2012-03-24 MED ORDER — MAGNESIUM SULFATE 50 % IJ SOLN
INTRAMUSCULAR | Status: DC
  Administered 2012-03-24: 09:00:00 via INTRAVENOUS
  Filled 2012-03-24 (×5): qty 1000

## 2012-03-24 MED ORDER — ENOXAPARIN SODIUM 30 MG/0.3ML ~~LOC~~ SOLN
30.0000 mg | Freq: Every day | SUBCUTANEOUS | Status: DC
  Administered 2012-03-24 – 2012-03-25 (×2): 30 mg via SUBCUTANEOUS
  Filled 2012-03-24 (×3): qty 0.3

## 2012-03-24 MED ORDER — SODIUM CHLORIDE 0.9 % IV SOLN
INTRAVENOUS | Status: DC
  Administered 2012-03-24 – 2012-03-26 (×5): via INTRAVENOUS
  Filled 2012-03-24 (×13): qty 1000

## 2012-03-24 NOTE — Progress Notes (Addendum)
Patient complaining of burning with urination. Notified MD Dr. Darrold Span.  Orders given to obtain UA.  Explained to patient and specimen sent to lab. Will continue to monitor

## 2012-03-24 NOTE — Progress Notes (Signed)
  03/24/2012, 7:24 AM  Hospital day: 3 Antibiotics: none Chemotherapy: oxaliplatin 03-05-12, Xeloda x 14 days completed shortly prior to admission  Subjective: Rouses easily to voice. Complains of being hungry and some reflux. Has emptied ostomy bag x2 overnight, still clear liquid. Bag last emptied 0500, now 1/3 filled. Denies shortness of breath or pain. Daughter at bedside. Does not like gatorade, did try Resource which I have encouraged.  C.diff resulted negative yesterday. First [place] Sandostatin given at 1400 and imodium begun in AM yesterday. Potassium improved after 6 runs KCL yesterday. IVF has 40 KCl and 2 gm magnesium.  Objective: Vital signs in last 24 hours: Blood pressure 123/83, pulse 82, temperature 98.4 F (36.9 C), temperature source Axillary, resp. rate 16, height 4\' 9"  (1.448 m), weight 92 lb 6 oz (41.9 kg), last menstrual period 02/20/2012, SpO2 97.00%. Oral mucosa moist. Respirations not labored supine on RA. Lungs without wheezes or rales. Cor RRR. PAC infusing without difficulty, IVF NS + 40 KCL + 2 gm magnesium at 125. Abdomen soft, not distended, bowel sounds normal. Clear watery liquid in ostomy bag. LE no edema, cords. Moves easily in bed.  Intake/Output from previous day: 06/29 0701 - 06/30 0700 In: 3850.2 [I.V.:3850.2] Out: 3000 [Urine:1950; Stool:1050] Intake/Output this shift:     Lab Results:  Basename 03/24/12 0508 03/23/12 2017  WBC 3.9* 4.0  HGB 10.2* 10.6*  HCT 29.2* 30.3*  PLT 214 224  ANC 1.7 BMET  Basename 03/24/12 0508 03/23/12 2017  NA 136 131*  K 4.8 4.3  CL 104 100  CO2 23 21  GLUCOSE 103* 117*  BUN 4* 4*  CREATININE 0.53 0.52  CALCIUM 8.4 8.2*   Remainder of full CMET today has Tprot 5.6 and alb 2.8  Magnesium now up to 2.7 and will decrease magnesium in IVF to 1 gm/ liter. Will continue K+ at 40/ liter with ongoing liquid stools. Studies/Results: No results found.   Assessment/Plan: 1. Chemo associated diarrhea following  oxaliplatin and xeloda: continue IVF, continue imodium and [place] sandostatin begun yesterday. Some bowel rest is usually helpful therefore continue clear liquids (including Resource) today + will let her have saltine crackers. 2.IIIB rectal cancer which has been treated with neoadjuvant chemoRT, surgery with ileostomy (final path no residual) and continuing chemotherapy. 3.PAC in 4.hypokalemia and hypomagnesemia improved. Both still in IVF due to ongoing diarrhea  Renee Nicholson P

## 2012-03-25 LAB — CBC WITH DIFFERENTIAL/PLATELET
Basophils Relative: 1 % (ref 0–1)
Eosinophils Absolute: 0.6 10*3/uL (ref 0.0–0.7)
Eosinophils Relative: 13 % — ABNORMAL HIGH (ref 0–5)
HCT: 31.2 % — ABNORMAL LOW (ref 36.0–46.0)
Hemoglobin: 10.6 g/dL — ABNORMAL LOW (ref 12.0–15.0)
Lymphs Abs: 1 10*3/uL (ref 0.7–4.0)
MCH: 29.1 pg (ref 26.0–34.0)
MCHC: 34 g/dL (ref 30.0–36.0)
MCV: 85.7 fL (ref 78.0–100.0)
Monocytes Absolute: 1 10*3/uL (ref 0.1–1.0)
Neutro Abs: 1.7 10*3/uL (ref 1.7–7.7)
Neutrophils Relative %: 38 % — ABNORMAL LOW (ref 43–77)

## 2012-03-25 LAB — COMPREHENSIVE METABOLIC PANEL
ALT: 24 U/L (ref 0–35)
AST: 18 U/L (ref 0–37)
Albumin: 2.9 g/dL — ABNORMAL LOW (ref 3.5–5.2)
Alkaline Phosphatase: 67 U/L (ref 39–117)
BUN: 4 mg/dL — ABNORMAL LOW (ref 6–23)
CO2: 23 mEq/L (ref 19–32)
Calcium: 8.4 mg/dL (ref 8.4–10.5)
Chloride: 101 mEq/L (ref 96–112)
Creatinine, Ser: 0.55 mg/dL (ref 0.50–1.10)
GFR calc Af Amer: >90 mL/min (ref >90)
GFR calc non Af Amer: >90 mL/min (ref >90)
Glucose, Bld: 93 mg/dL (ref 70–99)
Potassium: 4.5 mEq/L (ref 3.5–5.1)
Sodium: 133 mEq/L — ABNORMAL LOW (ref 135–145)
Total Bilirubin: 0.5 mg/dL (ref 0.3–1.2)
Total Protein: 5.8 g/dL — ABNORMAL LOW (ref 6.0–8.3)

## 2012-03-25 LAB — MAGNESIUM: Magnesium: 2.4 mg/dL (ref 1.5–2.5)

## 2012-03-25 LAB — PHOSPHORUS: Phosphorus: 2.9 mg/dL (ref 2.3–4.6)

## 2012-03-25 NOTE — Consult Note (Addendum)
WOC ostomy consult  Stoma type/location:  Consult requested for ostomy assistance.  Pt has had ileostomy stoma for several months and is independent with pouching activities when at home.  She is having increased output and is feeling poorly and would like pouching assistance. States she usually wears pouches for 4 days when at home. Stomal assessment/size: 1 inch, red and viable, slightly above skin level. Peristomal assessment: Purple circle surrounding stoma, appears to be a result of constant pressure from convex pouching system.  No breakdown to peristomal skin. Treatment options for stomal/peristomal skin: Pt uses paste at home but this is not available in our store room. Output 100cc liquid yellow stool. Ostomy pouching: 1pc convex pouch applied with barrier ring.  Pt asks appropriate questions and assists with pouch application and emptying.    Education provided: Encouraged to call [facility] discharge program for free samples of barrier rings if she prefers using them instead of paste.  Daughter at bedside also familiar with pouching routines.  Supplies left in room for staff and patient use. Will not plan to follow further unless re-consulted.  [address] RN, MSN, [employer]  [phone]

## 2012-03-25 NOTE — Progress Notes (Signed)
Patient's stool improved in appearance but output is still significant.  Stool has changed from greenish-yellow to brown, and is cloudy versus practically clear as in the past few days.  Still scant residue/fecal material. Teaching done on homemade gatorade recipe, foods that are tolerated better with diarrhea.  Patient was able to eat some toast, mashed potatoes, baked potato, and rice.  Drinking Resource Boosts.  Will continue to monitor.

## 2012-03-25 NOTE — Progress Notes (Signed)
Subjective: Patient feels a little bit better but she still continues to have profuse diarrhea which basically watery stools. Her nausea is improved she has not had any vomiting. She is feeling hungry today it would like to eat something. She is denying any bleeding problems she has not had any fevers or chills. Patient is asking for an ostomy consult as she is worried about her ostomy bag the stoma.  Objective:  Most recent Vital signs: Blood pressure 127/84, pulse 86, temperature 97.8 F (36.6 C), temperature source Axillary, resp. rate 18, height 4\' 9"  (1.448 m), weight 85 lb 3.2 oz (38.646 kg), last menstrual period 02/20/2012, SpO2 99.00%. 4\' 9"  (1.448 m)    Body surface area is 1.25 meters squared.  Vital signs in last 24 hours: Temp:  [97.3 F (36.3 C)-97.8 F (36.6 C)] 97.8 F (36.6 C) (07/01 1458) Pulse Rate:  [78-86] 86  (07/01 1458) Resp:  [18] 18  (07/01 1458) BP: (127-129)/(72-85) 127/84 mmHg (07/01 1458) SpO2:  [97 %-100 %] 99 % (07/01 1458) Weight:  [85 lb 3.2 oz (38.646 kg)] 85 lb 3.2 oz (38.646 kg) (07/01 0449)  Intake/Output from previous day: 06/30 0701 - 07/01 0700 In: 2923.4 [I.V.:2923.4] Out: 4750 [Urine:3050; Stool:1700]  Physical Exam: General appearance: alert, cooperative and appears stated age Resp: clear to auscultation bilaterally and normal percussion bilaterally Cardio: regular rate and rhythm, S1, S2 normal, no murmur, click, rub or gallop GI: soft, non-tender; bowel sounds normal; no masses,  no organomegaly and diarrheal stools Extremities: extremities normal, atraumatic, no cyanosis or edema Neurologic: Grossly normal  Lab Results:   Basename 03/25/12 0535 03/24/12 0508  WBC 4.3 3.9*  HGB 10.6* 10.2*  HCT 31.2* 29.2*  PLT 206 214   BMET:  Basename 03/25/12 0535 03/24/12 0508  NA 133* 136  K 4.5 4.8  CL 101 104  CO2 23 23  GLUCOSE 93 103*  BUN 4* 4*  CREATININE 0.55 0.53  CALCIUM 8.4 8.4   CMP:     Component Value Date/Time   NA 133* 03/25/2012 0535   K 4.5 03/25/2012 0535   CL 101 03/25/2012 0535   CO2 23 03/25/2012 0535   GLUCOSE 93 03/25/2012 0535   BUN 4* 03/25/2012 0535   CREATININE 0.55 03/25/2012 0535   CREATININE 0.70 07/27/2011 1306   CALCIUM 8.4 03/25/2012 0535   PROT 5.8* 03/25/2012 0535   ALBUMIN 2.9* 03/25/2012 0535   AST 18 03/25/2012 0535   ALT 24 03/25/2012 0535   ALKPHOS 67 03/25/2012 0535   BILITOT 0.5 03/25/2012 0535   GFRNONAA >90 03/25/2012 0535   GFRAA >90 03/25/2012 0535   Micro: Results for orders placed during the hospital encounter of 03/22/12  CLOSTRIDIUM DIFFICILE BY PCR     Status: Normal   Collection Time   03/22/12  9:33 PM      Component Value Range Status Comment   C difficile by pcr NEGATIVE  NEGATIVE Final    Studies/Results: No results found.  Medications: I have reviewed the patient's current medications.  Assessment/Plan: 46 y.o. female with #1 rectal carcinoma on adjuvant chemotherapy consisting of Xeloda and oxaliplatin. She developed significant diarrhea from the Xeloda. This is now being held.  #2 dehydration significantly improved.  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 Plan is to send her home with IV fluids. We will also get an ostomy consult.  #4 electrolytes look good her hydration status as well as her hypokalemia status is stable.  #5  hopefully patient to be sent home tomorrow with home health.     LOS: 3 days   Drue Second, MD Medical/Oncology [facility] [phone] (beeper) [phone] (Office)  03/25/2012, 3:17 PM

## 2012-03-26 ENCOUNTER — Ambulatory Visit: Payer: BC Managed Care – PPO | Admitting: Oncology

## 2012-03-26 ENCOUNTER — Telehealth: Payer: Self-pay | Admitting: *Deleted

## 2012-03-26 ENCOUNTER — Ambulatory Visit: Payer: BC Managed Care – PPO

## 2012-03-26 ENCOUNTER — Other Ambulatory Visit: Payer: BC Managed Care – PPO | Admitting: Lab

## 2012-03-26 LAB — COMPREHENSIVE METABOLIC PANEL
ALT: 18 U/L (ref 0–35)
AST: 16 U/L (ref 0–37)
Albumin: 2.9 g/dL — ABNORMAL LOW (ref 3.5–5.2)
Alkaline Phosphatase: 63 U/L (ref 39–117)
Calcium: 8.5 mg/dL (ref 8.4–10.5)
Potassium: 4.5 mEq/L (ref 3.5–5.1)
Sodium: 134 mEq/L — ABNORMAL LOW (ref 135–145)
Total Protein: 5.6 g/dL — ABNORMAL LOW (ref 6.0–8.3)

## 2012-03-26 LAB — CBC WITH DIFFERENTIAL/PLATELET
Basophils Absolute: 0.1 10*3/uL (ref 0.0–0.1)
Basophils Relative: 1 % (ref 0–1)
Eosinophils Absolute: 0.5 10*3/uL (ref 0.0–0.7)
Eosinophils Relative: 11 % — ABNORMAL HIGH (ref 0–5)
Lymphs Abs: 1.1 10*3/uL (ref 0.7–4.0)
MCH: 29.4 pg (ref 26.0–34.0)
MCHC: 34.5 g/dL (ref 30.0–36.0)
Neutrophils Relative %: 41 % — ABNORMAL LOW (ref 43–77)
Platelets: 199 10*3/uL (ref 150–400)
RBC: 3.57 MIL/uL — ABNORMAL LOW (ref 3.87–5.11)
RDW: 14.4 % (ref 11.5–15.5)

## 2012-03-26 MED ORDER — BOOST / RESOURCE BREEZE PO LIQD: 1.0000 | Freq: Three times a day (TID) | ORAL | Status: DC

## 2012-03-26 MED ORDER — HEPARIN SOD (PORK) LOCK FLUSH 100 UNIT/ML IV SOLN
INTRAVENOUS | Status: AC
  Administered 2012-03-26: 500 [IU]
  Filled 2012-03-26: qty 5

## 2012-03-26 NOTE — Telephone Encounter (Signed)
[facility] called asking for orders for the 1 0.9% Normal saline with 20 KCL pt. Is to receive daily at home.  Need a rate.  Per Phillis Haggis RN, Dr. Welton Flakes gave verbal order for a rate of 333 cc/hr.  This oder given with this call.

## 2012-03-26 NOTE — Discharge Instructions (Signed)
Diarrhea Infections caused by germs (bacterial) or a virus commonly cause diarrhea. Your caregiver has determined that with time, rest and fluids, the diarrhea should improve. In general, eat normally while drinking more water than usual. Although water may prevent dehydration, it does not contain salt and minerals (electrolytes). Broths, weak tea without caffeine and oral rehydration solutions (ORS) replace fluids and electrolytes. Small amounts of fluids should be taken frequently. Large amounts at one time may not be tolerated. Plain water may be harmful in infants and the elderly. Oral rehydrating solutions (ORS) are available at pharmacies and grocery stores. ORS replace water and important electrolytes in proper proportions. Sports drinks are not as effective as ORS and may be harmful due to sugars worsening diarrhea.  ORS is especially recommended for use in children with diarrhea. As a general guideline for children, replace any new fluid losses from diarrhea and/or vomiting with ORS as follows:   If your child weighs 22 pounds or under (10 kg or less), give 60-120 mL ( -  cup or 2 - 4 ounces) of ORS for each episode of diarrheal stool or vomiting episode.   If your child weighs more than 22 pounds (more than 10 kgs), give 120-240 mL ( - 1 cup or 4 - 8 ounces) of ORS for each diarrheal stool or episode of vomiting.   While correcting for dehydration, children should eat normally. However, foods high in sugar should be avoided because this may worsen diarrhea. Large amounts of carbonated soft drinks, juice, gelatin desserts and other highly sugared drinks should be avoided.   After correction of dehydration, other liquids that are appealing to the child may be added. Children should drink small amounts of fluids frequently and fluids should be increased as tolerated. Children should drink enough fluids to keep urine clear or pale yellow.   Adults should eat normally while drinking more fluids  than usual. Drink small amounts of fluids frequently and increase as tolerated. Drink enough fluids to keep urine clear or pale yellow. Broths, weak decaffeinated tea, lemon lime soft drinks (allowed to go flat) and ORS replace fluids and electrolytes.   Avoid:   Carbonated drinks.   Juice.   Extremely hot or cold fluids.   Caffeine drinks.   Fatty, greasy foods.   Alcohol.   Tobacco.   Too much intake of anything at one time.   Gelatin desserts.   Probiotics are active cultures of beneficial bacteria. They may lessen the amount and number of diarrheal stools in adults. Probiotics can be found in yogurt with active cultures and in supplements.   Wash hands well to avoid spreading bacteria and virus.   Anti-diarrheal medications are not recommended for infants and children.   Only take over-the-counter or prescription medicines for pain, discomfort or fever as directed by your caregiver. Do not give aspirin to children because it may cause Reye's Syndrome.   For adults, ask your caregiver if you should continue all prescribed and over-the-counter medicines.   If your caregiver has given you a follow-up appointment, it is very important to keep that appointment. Not keeping the appointment could result in a chronic or permanent injury, and disability. If there is any problem keeping the appointment, you must call back to this facility for assistance.  SEEK IMMEDIATE MEDICAL CARE IF:   You or your child is unable to keep fluids down or other symptoms or problems become worse in spite of treatment.   Vomiting or diarrhea develops and becomes persistent.     There is vomiting of blood or bile (green material).   There is blood in the stool or the stools are black and tarry.   There is no urine output in 6-8 hours or there is only a small amount of very dark urine.   Abdominal pain develops, increases or localizes.   You have a fever.   Your baby is older than 3 months with a  rectal temperature of 102 F (38.9 C) or higher.   Your baby is 14 months old or younger with a rectal temperature of 100.4 F (38 C) or higher.   You or your child develops excessive weakness, dizziness, fainting or extreme thirst.   You or your child develops a rash, stiff neck, severe headache or become irritable or sleepy and difficult to awaken.  MAKE SURE YOU:   Understand these instructions.   Will watch your condition.   Will get help right away if you are not doing well or get worse.  Document Released: 09/01/2002 Document Revised: 08/31/2011 Document Reviewed: 07/19/2009 [facility] Patient Information 2012 [place], [place].  Ostomy Support Information An ostomy is an opening in the belly (abdominal wall) made by surgery. Ostomates are people who have had this procedure. The opening (stoma) allows the kidney or bowel to discharge waste. An external pouch covers the stoma to collect waste. Pouches are are a simple bag and are odor free. Different companies have disposable or reusable pouches to fit one's lifestyle. An ostomy can either be temporary or permanent.  THERE ARE THREE MAIN TYPES OF OSTOMIES  Colostomy. A colostomy is a surgically created opening in the large intestine (colon).   Ileostomy. An ileostomy is a surgically created opening in the small intestine.   Urostomy. A urostomy is a surgically created opening to divert urine away from the bladder.  FREQUENTLY ASKED QUESTIONS Will I need to be on a special diet? Most people return to their normal diet when they have recovered from surgery. Be sure to chew your food well, eat a well-balanced diet and drink plenty of fluids. If you experience problems with a certain food, wait a couple of weeks and try it again. Will there be odor and noises? Pouching systems are designed to be odor-proof or odor-resistant. There are deodorants that can be used in the pouch. Medications are also available to help reduce odor. Limit  gas-producing foods and carbonated beverages. You will experience less gas and fewer noises as you heal from surgery. How much time will it take to care for my ostomy? At first, you may spend a lot of time learning about your ostomy and how to take care of it. As you become more comfortable and skilled at changing the pouching system, it will take very little time to care for it.  Will I be able to return to work? People with ostomies can perform most jobs. As soon as you have healed from surgery, you should be able to return to work. Heavy lifting (more than 10 pounds) may be discouraged.  What about intimacy? Sexual relationships and intimacy are important and fulfilling aspects of your life. They should continue after ostomy surgery. Intimacy-related concerns should be discussed openly between you and your partner.  Can I wear regular clothing? You do not need to wear special clothing. Ostomy pouches are fairly flat and barely noticeable. Elastic undergarments will not hurt the stoma or prevent the ostomy from functioning.  Can I participate in sports? An ostomy should not limit your involvement in sports.  Many people with ostomies are runners, skiers, swimmers or participate in other active lifestyles. Talk with your caregiver first before doing heavy physical activity. PROFESSIONAL HELP  Resources are available if you need help or have questions about your ostomy.   Specially trained nurses called Wound, Ostomy Continence Nurses (WOCN) are available for consultation in most major medical centers.   The [employer] ([employer]) is a group made up of many local chapters throughout the [place]. These local groups hold meetings and provide support to prospective and existing ostomates. They sponsor educational events and have qualified visitors to make personal or telephone visits. Contact the [employer] for the chapter nearest you and for other educational publications.   More detailed  information can be found in Colostomy Guide, a publication of the [employer] ([employer]OA). Contact [employer] at 1-(520)035-2077 or visit their web site at YellowSpecialist.at. The website contains links to other sites, suppliers and resources.  Document Released: 09/14/2003 Document Revised: 08/31/2011 Document Reviewed: 01/13/2009 [facility] Patient Information 2012 [place], [place].  Dehydration, Adult Dehydration is when you lose more fluids from the body than you take in. Vital organs like the kidneys, brain, and heart cannot function without a proper amount of fluids and salt. Any loss of fluids from the body can cause dehydration.  CAUSES   Vomiting.   Diarrhea.   Excessive sweating.   Excessive urine output.   Fever.  SYMPTOMS  Mild dehydration  Thirst.   Dry lips.   Slightly dry mouth.  Moderate dehydration  Very dry mouth.   Sunken eyes.   Skin does not bounce back quickly when lightly pinched and released.   Dark urine and decreased urine production.   Decreased tear production.   Headache.  Severe dehydration  Very dry mouth.   Extreme thirst.   Rapid, weak pulse (more than 100 beats per minute at rest).   Cold hands and feet.   Not able to sweat in spite of heat and temperature.   Rapid breathing.   Blue lips.   Confusion and lethargy.   Difficulty being awakened.   Minimal urine production.   No tears.  DIAGNOSIS  Your caregiver will diagnose dehydration based on your symptoms and your exam. Blood and urine tests will help confirm the diagnosis. The diagnostic evaluation should also identify the cause of dehydration. TREATMENT  Treatment of mild or moderate dehydration can often be done at home by increasing the amount of fluids that you drink. It is best to drink small amounts of fluid more often. Drinking too much at one time can make vomiting worse. Refer to the home care instructions below. Severe dehydration needs to be treated at the hospital  where you will probably be given intravenous (IV) fluids that contain water and electrolytes. HOME CARE INSTRUCTIONS   Ask your caregiver about specific rehydration instructions.   Drink enough fluids to keep your urine clear or pale yellow.   Drink small amounts frequently if you have nausea and vomiting.   Eat as you normally do.   Avoid:   Foods or drinks high in sugar.   Carbonated drinks.   Juice.   Extremely hot or cold fluids.   Drinks with caffeine.   Fatty, greasy foods.   Alcohol.   Tobacco.   Overeating.   Gelatin desserts.   Wash your hands well to avoid spreading bacteria and viruses.   Only take over-the-counter or prescription medicines for pain, discomfort, or fever as directed by your caregiver.  Ask your caregiver if you should continue all prescribed and over-the-counter medicines.   Keep all follow-up appointments with your caregiver.  SEEK MEDICAL CARE IF:  You have abdominal pain and it increases or stays in one area (localizes).   You have a rash, stiff neck, or severe headache.   You are irritable, sleepy, or difficult to awaken.   You are weak, dizzy, or extremely thirsty.  SEEK IMMEDIATE MEDICAL CARE IF:   You are unable to keep fluids down or you get worse despite treatment.   You have frequent episodes of vomiting or diarrhea.   You have blood or green matter (bile) in your vomit.   You have blood in your stool or your stool looks black and tarry.   You have not urinated in 6 to 8 hours, or you have only urinated a small amount of very dark urine.   You have a fever.   You faint.  MAKE SURE YOU:   Understand these instructions.   Will watch your condition.   Will get help right away if you are not doing well or get worse.  Document Released: 09/11/2005 Document Revised: 08/31/2011 Document Reviewed: 05/01/2011 [facility] Patient Information 2012 [place], [place]. B.R.A.T. Diet Your doctor has recommended the B.R.A.T.  diet for you or your child until the condition improves. This is often used to help control diarrhea and vomiting symptoms. If you or your child can tolerate clear liquids, you may have:  Bananas.   Rice.   Applesauce.   Toast (and other simple starches such as crackers, potatoes, noodles).  Be sure to avoid dairy products, meats, and fatty foods until symptoms are better. Fruit juices such as apple, grape, and prune juice can make diarrhea worse. Avoid these. Continue this diet for 2 days or as instructed by your caregiver. Document Released: 09/11/2005 Document Revised: 08/31/2011 Document Reviewed: 02/28/2007 [facility] Patient Information 2012 [place], [place].

## 2012-03-26 NOTE — Progress Notes (Signed)
Pt plan to dc home with Home Health RN. Pt selected [facility] for [facility]. Referral was given to in house rep.mp

## 2012-03-26 NOTE — Discharge Summary (Signed)
Physician Discharge Summary  Patient ID: Renee Nicholson MRN: [id number] DOB/AGE: August 10, 1966 46 y.o.  Admit date: 03/22/2012 Discharge date: 03/26/2012  Admission Diagnoses: 46 year old female with rectal carcinoma. Patient was admitted with severe diarrhea secondary to chemotherapy dehydration secondary to side effects from her chemotherapy weakness fatigue failure to thrive.  Discharge Diagnoses:  #1 dehydration resolved. But patient will continue getting IV fluids as an outpatient.   #2 hypokalemia resolve.  #3 diarrhea improved  #4 weakness improved  #5 rectal carcinoma  #6 mild anemia  Discharged Condition: good  Hospital Course: Patient was admitted through medical oncology. On day of admission patient was very weak tired fatigued. She also was noted to have hand-foot syndrome and severe diarrhea. She was admitted directly from the medical oncology clinic 11/07/1937 oncology floor. She was started on IV fluids. She had a be met performed that showed hypokalemia this was replaced during her hospitalization. Patient was begun on clear liquids for her diet and slowly her diet was advanced. She was also begun on Sandostatin subcutaneous with improvement of her diarrhea. Patient was seen during her hospitalization by Ostomy consult was done for teaching purposes. On day of discharge the patient was significantly improved able to ambulate.  Consults: Wound care ostomy care  Significant Diagnostic Studies:   Treatments: IV hydration  Discharge Exam: Blood pressure 126/81, pulse 73, temperature 97.9 F (36.6 C), temperature source Axillary, resp. rate 20, height 4\' 9"  (1.448 m), weight 85 lb 3.2 oz (38.646 kg), last menstrual period 02/20/2012, SpO2 99.00%. General appearance: alert, cooperative and appears stated age Throat: lips, mucosa, and tongue normal; teeth and gums normal Neck: no adenopathy, no carotid bruit, no JVD, supple, symmetrical, trachea midline and thyroid not enlarged,  symmetric, no tenderness/mass/nodules Back: symmetric, no curvature. ROM normal. No CVA tenderness. Resp: clear to auscultation bilaterally and normal percussion bilaterally GI: soft, non-tender; bowel sounds normal; no masses,  no organomegaly Extremities: extremities normal, atraumatic, no cyanosis or edema Skin: Skin color, texture, turgor normal. No rashes or lesions  Disposition: 01-Home or Self Care  Discharge Orders    Future Appointments: Provider: Department: Dept Phone: Center:   04/01/2012 11:00 AM Radene Gunning [facility] [phone] None   04/01/2012 11:30 AM Victorino December, MD [facility] [id number] None   04/02/2012 12:45 PM [facility] [phone] None   04/09/2012 10:00 AM Beverely Pace Shumate [facility] [phone] None   04/09/2012 10:30 AM Victorino December, MD [facility] [phone] None   04/16/2012 10:30 AM Dava Najjar Idelle Jo [facility] [id number] None   04/16/2012 11:00 AM Victorino December, MD [facility] [id number] None   04/23/2012 11:00 AM Krista Blue [facility] [phone] None   04/23/2012 11:30 AM Victorino December, MD [facility] [phone] None   04/23/2012 12:15 PM [facility] [phone] None   04/30/2012 11:00 AM Mauri Brooklyn [facility] [phone] None   04/30/2012 11:30 AM Victorino December, MD [facility] [phone] None   05/06/2012 1:00 PM Windell Hummingbird [facility] [phone] None   05/06/2012 1:30 PM Victorino December, MD [facility] [phone] None   05/07/2012 11:30 AM [facility] [phone] None   05/14/2012 11:00 AM Mauri Brooklyn [facility] [phone] None   05/14/2012 11:30 AM Victorino December, MD [facility] [phone] None   05/20/2012 11:00 AM Krista Blue [facility] [phone] None   05/20/2012 11:30 AM Victorino December, MD [facility] [phone] None  Future Orders Please Complete By Expires   Ambulatory referral to Home Health       Comments:   Please evaluate Renee Nicholson for admission to [facility].  Disciplines requested: Nursing  Services to provide: Wound/Ostomy Care and Other: IV fluids consisting of Normal saline with 20 mEq of Potassium Chloride, 1000 ml daily x 7 days  Physician to follow patient's care (the person listed here will be responsible for signing ongoing orders): Referring Provider  Requested Start of Care Date: Tomorrow  Special Instructions:  Patient to have home IV therapy     Medication List  As of 03/26/2012  8:56 AM   STOP taking these medications         potassium chloride SA 20 MEQ tablet         TAKE these medications         acetaminophen 325 MG tablet   Commonly known as: TYLENOL   Take 2 tablets (650 mg total) by mouth every 4 (four) hours as needed.      dexamethasone 4 MG tablet   Commonly known as: DECADRON   Take 2 tablets by mouth daily starting the day after chemotherapy for 2 days. Take with food.      feeding supplement Liqd   Take 237 mLs by mouth 3 (three) times daily between meals.      feeding supplement Liqd   Take 1 Container by mouth 3 (three) times daily between meals.      heparin flush (porcine) 100 UNIT/ML injection   1 mL (100 Units total) by Intracatheter route as needed (250 units per lumen).      HYDROcodone-acetaminophen 5-500 MG per tablet   Commonly known as: VICODIN   Take 1-2 tablets by mouth every 6 (six) hours as needed for pain.      Iron 28 MG Tabs   Take 1 tablet by mouth daily.      lidocaine-prilocaine cream   Commonly known as: EMLA   Apply topically as needed.      loperamide 2 MG capsule   Commonly known as: IMODIUM   Take 2 capsules by mouth Every 4 hours as needed.      megestrol 400 MG/10ML suspension   Commonly known as: MEGACE   Take 10 mLs (400 mg total) by mouth 2 (two) times daily.      multivitamin tablet   Take 1 tablet by mouth every morning.      pantoprazole 40 MG tablet   Commonly known as: PROTONIX    Take 40 mg by mouth daily.      polysaccharide iron 150 MG capsule   Generic drug: iron polysaccharides   Take 150 mg by mouth every morning.      prochlorperazine 10 MG tablet   Commonly known as: COMPAZINE   Take 1 tablet (10 mg total) by mouth every 6 (six) hours as needed (Nausea or vomiting).      prochlorperazine 25 MG suppository   Commonly known as: COMPAZINE   Place 1 suppository (25 mg total) rectally every 12 (twelve) hours as needed for nausea.      simethicone 80 MG chewable tablet   Commonly known as: MYLICON   Chew 80 mg by mouth every 6 (six) hours as needed. For gas           Follow-up Information    Follow up with Drue Second, MD. Schedule an appointment as soon as possible for a visit in 7 days. (If symptoms worsen)  Contact information:   [address] [place] [address] [phone]          Signed: Drue Second, MD Medical/Oncology [facility] [phone] (beeper) [phone] (Office)  03/26/2012, 8:56 AM

## 2012-04-01 ENCOUNTER — Encounter: Payer: Self-pay | Admitting: Oncology

## 2012-04-01 ENCOUNTER — Telehealth: Payer: Self-pay | Admitting: Oncology

## 2012-04-01 ENCOUNTER — Ambulatory Visit: Payer: BC Managed Care – PPO

## 2012-04-01 ENCOUNTER — Other Ambulatory Visit (HOSPITAL_BASED_OUTPATIENT_CLINIC_OR_DEPARTMENT_OTHER): Payer: BC Managed Care – PPO | Admitting: Lab

## 2012-04-01 ENCOUNTER — Ambulatory Visit (HOSPITAL_BASED_OUTPATIENT_CLINIC_OR_DEPARTMENT_OTHER): Payer: BC Managed Care – PPO | Admitting: Oncology

## 2012-04-01 VITALS — BP 107/71 | HR 71 | Temp 98.1°F | Ht <= 58 in | Wt 85.3 lb

## 2012-04-01 DIAGNOSIS — E860 Dehydration: Secondary | ICD-10-CM

## 2012-04-01 DIAGNOSIS — C20 Malignant neoplasm of rectum: Secondary | ICD-10-CM

## 2012-04-01 LAB — CBC WITH DIFFERENTIAL/PLATELET
BASO%: 0.4 % (ref 0.0–2.0)
Basophils Absolute: 0 10*3/uL (ref 0.0–0.1)
EOS%: 18.9 % — ABNORMAL HIGH (ref 0.0–7.0)
HCT: 30.2 % — ABNORMAL LOW (ref 34.8–46.6)
HGB: 10.6 g/dL — ABNORMAL LOW (ref 11.6–15.9)
LYMPH%: 17.9 % (ref 14.0–49.7)
MCH: 29.9 pg (ref 25.1–34.0)
MCHC: 35.1 g/dL (ref 31.5–36.0)
MONO#: 0.4 10*3/uL (ref 0.1–0.9)
NEUT%: 55.3 % (ref 38.4–76.8)
Platelets: 169 10*3/uL (ref 145–400)
lymph#: 1 10*3/uL (ref 0.9–3.3)

## 2012-04-01 LAB — COMPREHENSIVE METABOLIC PANEL
AST: 24 U/L (ref 0–37)
Albumin: 3.6 g/dL (ref 3.5–5.2)
BUN: 8 mg/dL (ref 6–23)
Calcium: 8.6 mg/dL (ref 8.4–10.5)
Chloride: 105 mEq/L (ref 96–112)
Creatinine, Ser: 0.72 mg/dL (ref 0.50–1.10)
Glucose, Bld: 111 mg/dL — ABNORMAL HIGH (ref 70–99)
Potassium: 3.5 mEq/L (ref 3.5–5.3)

## 2012-04-01 MED ORDER — SODIUM CHLORIDE 0.9 % IV SOLN: 1000.0000 mL | Freq: Once | INTRAVENOUS | Status: DC

## 2012-04-01 NOTE — Telephone Encounter (Signed)
gve the pt her revised July,aug 2013 appt calendar

## 2012-04-01 NOTE — Patient Instructions (Addendum)
1. Continue IVF at home.  2. I will see you back on 7/16 with chemotherapy on that day.  3. Increase your food intake, continue taking imodium

## 2012-04-01 NOTE — Progress Notes (Signed)
OFFICE PROGRESS NOTE  CC: [name], MD [name], MD [name], MD [name], MD [address] [place] [address] [name], MD  DIAGNOSIS: 46 year old female with new diagnosis of rectal carcinoma by rectal ultrasound she was found to have a T3 N1 lesion.  PRIOR THERAPY:   #1 patient presented with a several month history of diarrhea and then subsequent bleeding. She was found to be anemic with a hemoglobin of 10. Because of this she went on to have a colonoscopy performed. The colonoscopy showed multiple sessile polyps in the cecum ascending transverse and sigmoid colon as well as the rectum. The rectal area showed a large exophytic rectal mass approximately 1 cm above the dentate line measuring 3-4 cm. She had a biopsy of this performed and was found to be an adenocarcinoma consistent with a rectal primary.  #2 patient was seen by Dr. [name] at [facility] who performed a rectal ultrasound. The staging clinically was T3 N1. It was recommended by Dr. [name] the patient undergo neoadjuvant chemotherapy and radiation higher to her definitive surgery.  #3 Patient has completed concurrent radiation and chemotherapy. Her chemotherapy consisted of single agent xeloda administrated 08/22/11 - 10/09/2011. Overall she tolerated her treatment very well  #4 Has completed all of neoadjuvant treatment And the PET scan shows no evidence of distant metastasis and a para rectal peritoneal nodes less impressive she has had significant response to therapy.  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 APC is negative  #6 patient had a laparotomy with proctocolectomy, J-pouch with ileoanal anastomosis, loop ileostomy on 12/14/2011. The final pathology did not reveal any evidence of residual disease. Patient had a  complicated postop course including high output ileostomy. She required intensive IV fluids on an outpatient basis to prevent dehydration.  CURRENT THERAPY: s/p cycle 1 of  XELOX on 03/05/12  INTERVAL HISTORY: Renee Nicholson 46 y.o. female returns for followup visit today. Today patient looks much improved since her last visit with me on 03/22/2012 when she required hospitalization For failure to thrive severe dehydration and diarrhea secondary to chemotherapy. During her hospitalization she did receive IV fluids anti-emetics. She did also receive Sandostatin to help with the diarrhea which did improve significantly. Today patient has gained some weight since her admission she does not have any nausea or vomiting. Her skin continues to desquamation especially in her fingers. It is dark and. But there is no blistering at this point. She denies any nausea or vomiting she still has loose stools but they are alternating with more firmer formed stools. She is able to eat more and in fact does tell me that she is hungry a lot and would like to eat as much as she possibly can. We did discuss what types of foods she should be beating. She denies any cough shortness of breath chest pains or palpitations. She has no tingling or numbness in her hands or feet. Remainder of the 10 point review of systems is negative.  MEDICAL HISTORY: Past Medical History  Diagnosis Date  . Anemia   . Diarrhea   . Rectal cancer 08/16/2011  . Colon cancer   . Anxiety   . Bright red rectal bleeding 09/13/2011  . History of chemotherapy     completed 09/2011   . Hx of radiation therapy 08/29/11 to 10/09/11  rectum    ALLERGIES:   has no known allergies.  MEDICATIONS:  Current Outpatient Prescriptions  Medication Sig Dispense Refill  . acetaminophen (TYLENOL) 325 MG tablet Take 2 tablets (650 mg total) by mouth every 4 (four) hours as needed.  200 tablet  1  . dexamethasone (DECADRON) 4 MG tablet Take 2 tablets by mouth daily  starting the day after chemotherapy for 2 days. Take with food.  30 tablet  1  . feeding supplement (ENSURE COMPLETE) LIQD Take 237 mLs by mouth 3 (three) times daily between meals.  50 Bottle  6  . feeding supplement (RESOURCE BREEZE) LIQD Take 1 Container by mouth 3 (three) times daily between meals.  30 Container  6  . Ferrous Sulfate (IRON) 28 MG TABS Take 1 tablet by mouth daily.      . Heparin Lock Flush (HEPARIN FLUSH, PORCINE,) 100 UNIT/ML injection 1 mL (100 Units total) by Intracatheter route as needed (250 units per lumen).  5 Syringe  3  . HYDROcodone-acetaminophen (VICODIN) 5-500 MG per tablet Take 1-2 tablets by mouth every 6 (six) hours as needed for pain.  30 tablet  0  . lidocaine-prilocaine (EMLA) cream Apply topically as needed.  30 g  6  . loperamide (IMODIUM) 2 MG capsule Take 2 capsules by mouth Every 4 hours as needed.      . megestrol (MEGACE) 400 MG/10ML suspension Take 10 mLs (400 mg total) by mouth 2 (two) times daily.  240 mL  1  . Multiple Vitamin (MULTIVITAMIN) tablet Take 1 tablet by mouth every morning.       . pantoprazole (PROTONIX) 40 MG tablet Take 40 mg by mouth daily.      . polysaccharide iron (NIFEREX) 150 MG CAPS capsule Take 150 mg by mouth every morning.       . prochlorperazine (COMPAZINE) 10 MG tablet Take 1 tablet (10 mg total) by mouth every 6 (six) hours as needed (Nausea or vomiting).  30 tablet  1  . prochlorperazine (COMPAZINE) 25 MG suppository Place 1 suppository (25 mg total) rectally every 12 (twelve) hours as needed for nausea.  12 suppository  3  . simethicone (MYLICON) 80 MG chewable tablet Chew 80 mg by mouth every 6 (six) hours as needed. For gas        SURGICAL HISTORY:  Past Surgical History  Procedure Date  . Tubal ligation   . Carpal tunnel release   . Colonoscopy 07/26/2011    Procedure: COLONOSCOPY;  Surgeon: Arlyce Harman, MD;  Location: [facility];  Service: Endoscopy;  Laterality: N/A;  10:40  .  Esophagogastroduodenoscopy 07/26/2011    Procedure: ESOPHAGOGASTRODUODENOSCOPY (EGD);  Surgeon: Arlyce Harman, MD;  Location: [facility];  Service: Endoscopy;  Laterality: N/A;  . Esophagogastroduodenoscopy 12/27/2011    Procedure: ESOPHAGOGASTRODUODENOSCOPY (EGD);  Surgeon: Shirley Friar, MD;  Location: [facility];  Service: Endoscopy;  Laterality: N/A;  . Laparoscopic colon resection 12/14/11    diverting ileostomy  . Portacath placement 02/21/2012    Procedure: INSERTION PORT-A-CATH;  Surgeon: [name], MD;  Location: [facility] OR;  Service: General;  Laterality: N/A;    REVIEW OF SYSTEMS:  Pertinent items are noted in HPI.   PHYSICAL EXAMINATION: General appearance: alert, cooperative, appears stated age, mild distress and pale Resp: clear to auscultation bilaterally and normal percussion bilaterally Cardio: regular rate and rhythm, S1, S2 normal, no murmur, click, rub or gallop GI: soft, non-tender; bowel sounds normal; no masses,  no organomegaly Extremities: extremities  normal, atraumatic, no cyanosis or edema Neurologic: Alert and oriented X 3, normal strength and tone. Normal symmetric reflexes. Normal coordination and gait Rectal Exam: patient does not have any active bleeding, there is noted to be external hemorrhoids.  ECOG PERFORMANCE STATUS: 0 - Asymptomatic  Blood pressure 107/71, pulse 71, temperature 98.1 F (36.7 C), temperature source Oral, height 4\' 9"  (1.448 m), weight 85 lb 4.8 oz (38.692 kg), last menstrual period 02/20/2012.  LABORATORY DATA: Lab Results  Component Value Date   WBC 5.5 04/01/2012   HGB 10.6* 04/01/2012   HCT 30.2* 04/01/2012   MCV 85.1 04/01/2012   PLT 169 04/01/2012      Chemistry      Component Value Date/Time   NA 134* 03/26/2012 0715   K 4.5 03/26/2012 0715   CL 103 03/26/2012 0715   CO2 23 03/26/2012 0715   BUN 4* 03/26/2012 0715   CREATININE 0.57 03/26/2012 0715   CREATININE 0.70 07/27/2011 1306      Component Value Date/Time   CALCIUM 8.5  03/26/2012 0715   ALKPHOS 63 03/26/2012 0715   AST 16 03/26/2012 0715   ALT 18 03/26/2012 0715   BILITOT 0.4 03/26/2012 0715     ADDITIONAL INFORMATION: 1. Following placement in clearing solution, three more lymph nodes were identified, all of which are benign. Thus, the total lymph node count is 11. (JK:mw 12-18-11) 2. Following an exhaustive search, additional tissue has been submitted in an attempt to recover lymph nodes. Two more lymph nodes were identified, both benign. This brings the total number of lymph nodes recovered to 13, all of which are benign. (JBK:eps 12/26/11) FINAL DIAGNOSIS Diagnosis 1. Colon, resection margin (donut), distal rectal - COLORECTAL MUCOSA WITH FIBROSIS AND EDEMA, CONSISTENT WITH TREATMENT. - THERE IS NO EVIDENCE OF MALIGNANCY. 2. Colon, total resection (incl lymph nodes) - COLON WITH SCATTERED TUBULAR ADENOMAS. - HIGH GRADE DYSPLASIA IS NOT IDENTIFIED. - BENIGN APPENDIX WITH FIBROUS OBLITERATION OF THE TIP. - THERE IS NO EVIDENCE OF CARCINOMA IN 8 OF 8 LYMPH NODES (0/8). - SEE ONCOLOGY TABLE BELOW. 3. Colon, resection margin (donut), new distal margin and rectum - BENIGN SQUAMOUS LINED MUCOSA. - THERE IS NO EVIDENCE OF MALIGNANCY. Microscopic Comment 2. COLON AND RECTUM Specimen: Colon, appendix, and terminal ileum. Procedure: Total colectomy. Tumor site: N/A Specimen integrity: Intact. Macroscopic intactness of mesorectum: Complete. Macroscopic tumor perforation: N/A Invasive tumor: N/A 1 of 3 FINAL for Renee Nicholson, Renee Nicholson ([id number]) Microscopic Comment(continued) Microscopic extension of invasive tumor: N/A Lymph-Vascular invasion: N/A Peri-neural invasion: N/A Tumor deposit(s) (discontinuous extramural extension): N/A Resection margins: Negative for adenocarcinoma. Treatment effect (neoadjuvant therapy): Present, significant. Number of lymph nodes examined- 8 ; Number positive - 0 (PLEASE NOTE: Additional tissue will be submitted in an attempt to  recover more lymph nodes) Additional polyp(s): Multiple scattered tubular adenomas. Pathologic Staging: ypT0, ypN0 Ancillary studies: N/A Comments: Grossly, there is a 1.5 cm granular depressed area of mucosa present at the distal portion of the specimen. This entire area has been submitted for histologic evaluation revealing inflamed, fibrotic, and edematous colorectal mucosa, consistent with treatment effect. Adenocarcinoma is not identified. In addition, there are scattered tubular adenomas throughout the specimen. No high grade dysplasia is identified. The case was discussed with Dr. Donell Beers on 12/15/2011. (JBK:gt, 12/15/11) Pecola Leisure MD Pathologist, Electronic Signature (Case signed 12/15/2011) Valentino Hue  RADIOGRAPHIC STUDIES:  Nm Pet Image Initial (pi) Skull Base To Thigh 1.  Anorectal primary without evidence of distant metastasis. 2.  Perirectal/peritoneal nodes described on  the prior diagnostic CT are less impressive today and may have undergone response to therapy.  Based on location, these were suspicious on the prior. 3.  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 Neoplasm cannot be excluded.  Consider further evaluation with ultrasound. 4.  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  Consider correlation with cardiac risk factors.  5.  Sinus disease.  Original Report Authenticated By: Consuello Bossier, M.D.     ASSESSMENT: 46 year old female with:  #1. stage III adenocarcinoma of the rectum. Patient is now status post definitive surgery after having received neoadjuvant chemotherapy and radiation therapy. Her final pathology does not reveal any evidence of residual disease. 8 nodes were negative for metastatic disease.  #2 patient began adjuvant chemotherapy consisting of Xeloda and oxaliplatin This will be given every 21 days. Oxaliplatin will be on day 1. The Xeloda will be given on days 1 through 14 with 7 days  off. Her dose is 1500 mg twice a day. Unfortunately after her first cycle patient developed severe dehydration secondary to diarrhea and skin foot syndrome from the Xeloda. She did require hospitalization for this. She was admitted for about 4 days to a sling longer. She was discharged to home on March 27 2012.  PLAN:  #1We will hold her chemotherapy this week. She will get oxaliplatin on 04/09/2012. As far as her dose of the Xeloda is concerned I will give her 500 mg twice a day initially. And if she tolerates this then we certainly can try to increase the dose with her subsequent cycles.  #2 patient will continue to advance her diet to more tolerated form that she is able to eat she does tell me that she is hungry all the time. We discussed the type of foods she should be eating at length with her and her family through the translator.  #3 patient will continue to take the Imodium to help prevent diarrhea. She is also receiving IV fluids at home and for now we will continue this.  #4 patient will be seen back in one week's time for followup on July 16.  All questions were answered. The patient knows to call the clinic with any problems, questions or concerns. We can certainly see the patient much sooner if necessary.  I spent 30 minutes counseling the patient face to face. The total time spent in the appointment was 30 minutes.    Drue Second, MD Medical/Oncology [facility] [phone] (beeper) [phone] (Office)  04/01/2012, 12:01 PM

## 2012-04-02 ENCOUNTER — Other Ambulatory Visit: Payer: BC Managed Care – PPO | Admitting: Lab

## 2012-04-02 ENCOUNTER — Ambulatory Visit: Payer: BC Managed Care – PPO

## 2012-04-08 ENCOUNTER — Ambulatory Visit: Payer: BC Managed Care – PPO | Admitting: Oncology

## 2012-04-09 ENCOUNTER — Other Ambulatory Visit: Payer: Self-pay | Admitting: *Deleted

## 2012-04-09 ENCOUNTER — Telehealth: Payer: Self-pay | Admitting: *Deleted

## 2012-04-09 ENCOUNTER — Ambulatory Visit (HOSPITAL_BASED_OUTPATIENT_CLINIC_OR_DEPARTMENT_OTHER): Payer: BC Managed Care – PPO | Admitting: Oncology

## 2012-04-09 ENCOUNTER — Telehealth: Payer: Self-pay | Admitting: Oncology

## 2012-04-09 ENCOUNTER — Encounter: Payer: Self-pay | Admitting: Oncology

## 2012-04-09 ENCOUNTER — Other Ambulatory Visit (HOSPITAL_BASED_OUTPATIENT_CLINIC_OR_DEPARTMENT_OTHER): Payer: BC Managed Care – PPO

## 2012-04-09 ENCOUNTER — Ambulatory Visit: Payer: BC Managed Care – PPO

## 2012-04-09 VITALS — BP 100/67 | HR 67 | Temp 98.6°F | Ht <= 58 in | Wt 85.1 lb

## 2012-04-09 DIAGNOSIS — E860 Dehydration: Secondary | ICD-10-CM

## 2012-04-09 DIAGNOSIS — Z932 Ileostomy status: Secondary | ICD-10-CM

## 2012-04-09 DIAGNOSIS — J3489 Other specified disorders of nose and nasal sinuses: Secondary | ICD-10-CM

## 2012-04-09 DIAGNOSIS — C20 Malignant neoplasm of rectum: Secondary | ICD-10-CM

## 2012-04-09 DIAGNOSIS — E079 Disorder of thyroid, unspecified: Secondary | ICD-10-CM

## 2012-04-09 DIAGNOSIS — R198 Other specified symptoms and signs involving the digestive system and abdomen: Secondary | ICD-10-CM

## 2012-04-09 LAB — COMPREHENSIVE METABOLIC PANEL
Albumin: 3.9 g/dL (ref 3.5–5.2)
Alkaline Phosphatase: 64 U/L (ref 39–117)
BUN: 12 mg/dL (ref 6–23)
CO2: 26 mEq/L (ref 19–32)
Calcium: 8.9 mg/dL (ref 8.4–10.5)
Chloride: 106 mEq/L (ref 96–112)
Glucose, Bld: 112 mg/dL — ABNORMAL HIGH (ref 70–99)
Potassium: 3.8 mEq/L (ref 3.5–5.3)
Sodium: 140 mEq/L (ref 135–145)
Total Protein: 6.1 g/dL (ref 6.0–8.3)

## 2012-04-09 LAB — CBC WITH DIFFERENTIAL/PLATELET
Basophils Absolute: 0 10*3/uL (ref 0.0–0.1)
Eosinophils Absolute: 0.2 10*3/uL (ref 0.0–0.5)
HCT: 30.1 % — ABNORMAL LOW (ref 34.8–46.6)
HGB: 10.5 g/dL — ABNORMAL LOW (ref 11.6–15.9)
MONO#: 0.4 10*3/uL (ref 0.1–0.9)
NEUT#: 2.2 10*3/uL (ref 1.5–6.5)
NEUT%: 63.2 % (ref 38.4–76.8)
RDW: 15.8 % — ABNORMAL HIGH (ref 11.2–14.5)
lymph#: 0.8 10*3/uL — ABNORMAL LOW (ref 0.9–3.3)

## 2012-04-09 MED ORDER — SODIUM CHLORIDE 0.9 % IV SOLN: 1000.0000 mL | INTRAVENOUS | Status: DC

## 2012-04-09 MED ORDER — SODIUM CHLORIDE 0.9 % IV SOLN: INTRAVENOUS | Status: DC

## 2012-04-09 NOTE — Progress Notes (Signed)
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  PRIOR THERAPY:   #1 patient presented with a several month history of diarrhea and then subsequent bleeding. She was found to be anemic with a hemoglobin of 10. Because of this she went on to have a colonoscopy performed. The colonoscopy showed multiple sessile polyps in the cecum ascending transverse and sigmoid colon as well as the rectum. The rectal area showed a large exophytic rectal mass approximately 1 cm above the dentate line measuring 3-4 cm. She had a biopsy of this performed and was found to be an adenocarcinoma consistent with a rectal primary.  #2 patient was seen by Dr. [name] at [facility] who performed a rectal ultrasound. The staging clinically was T3 N1. It was recommended by Dr. [name] the patient undergo neoadjuvant chemotherapy and radiation higher to her definitive surgery.  #3 Patient has completed concurrent radiation and chemotherapy. Her chemotherapy consisted of single agent xeloda administrated 08/22/11 - 10/09/2011. Overall she tolerated her treatment very well  #4 Has completed all of neoadjuvant treatment And the PET scan shows no evidence of distant metastasis and a para rectal peritoneal nodes less impressive she has had significant response to therapy.  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 APC is negative  #6 patient had a laparotomy with proctocolectomy, J-pouch with ileoanal anastomosis, loop ileostomy on 12/14/2011. The final pathology did not reveal any evidence of residual disease. Patient had a  complicated postop course including high output ileostomy. She required intensive IV fluids on an outpatient basis to prevent dehydration.  #7 patient began adjuvant chemotherapy consisting of XELOX starting 03/05/2012 on a 21 day cycle.A total of 6 cycles are planned.  CURRENT THERAPY: Patient today is here for cycle #2 of Xeloda and oxaliplatin.  INTERVAL HISTORY: Renee Nicholson 45 y.o. female returns for followup visit today.Today she feels much better she looks well-hydrated. She had been receiving IV fluids at home. She is eating much better. Her hands and feet are healing from skin toxicity from the Xeloda. She today denies any headaches double vision blurring of vision fevers chills night sweats she has no shortness of breath chest pains palpitations no myalgias or arthralgias. Her colostomy site looks healed she still has liquid stool but she does state that it alternates with formed stool she has not noticed any blood. Remainder of the 10 point review of systems is negative. MEDICAL HISTORY: Past Medical History  Diagnosis Date  . Anemia   . Diarrhea   . Rectal cancer 08/16/2011  . Colon cancer   . Anxiety   . Bright red rectal bleeding 09/13/2011  . History of chemotherapy     completed 09/2011   . Hx of radiation therapy 08/29/11 to 10/09/11    rectum    ALLERGIES:   has no known allergies.  MEDICATIONS:  Current Outpatient Prescriptions  Medication Sig Dispense Refill  . acetaminophen (TYLENOL) 325 MG tablet Take 2 tablets (650 mg total) by mouth every 4 (four) hours as needed.  200 tablet  1  . dexamethasone (DECADRON) 4 MG tablet Take 2 tablets by mouth  daily starting the day after chemotherapy for 2 days. Take with food.  30 tablet  1  . feeding supplement (ENSURE COMPLETE) LIQD Take 237 mLs by mouth 3 (three) times daily between meals.  50 Bottle  6  . feeding supplement (RESOURCE BREEZE) LIQD Take 1 Container by mouth 3 (three) times daily between meals.  30 Container  6    . Ferrous Sulfate (IRON) 28 MG TABS Take 1 tablet by mouth daily.      . Heparin Lock Flush (HEPARIN FLUSH, PORCINE,) 100 UNIT/ML injection 1 mL (100 Units total) by Intracatheter route as needed (250 units per lumen).  5 Syringe  3  . HYDROcodone-acetaminophen (VICODIN) 5-500 MG per tablet Take 1-2 tablets by mouth every 6 (six) hours as needed for pain.  30 tablet  0  . lidocaine-prilocaine (EMLA) cream Apply topically as needed.  30 g  6  . loperamide (IMODIUM) 2 MG capsule Take 2 capsules by mouth Every 4 hours as needed.      . megestrol (MEGACE) 400 MG/10ML suspension Take 10 mLs (400 mg total) by mouth 2 (two) times daily.  240 mL  1  . Multiple Vitamin (MULTIVITAMIN) tablet Take 1 tablet by mouth every morning.       . pantoprazole (PROTONIX) 40 MG tablet Take 40 mg by mouth daily.      . polysaccharide iron (NIFEREX) 150 MG CAPS capsule Take 150 mg by mouth every morning.       . prochlorperazine (COMPAZINE) 10 MG tablet Take 1 tablet (10 mg total) by mouth every 6 (six) hours as needed (Nausea or vomiting).  30 tablet  1  . prochlorperazine (COMPAZINE) 25 MG suppository Place 1 suppository (25 mg total) rectally every 12 (twelve) hours as needed for nausea.  12 suppository  3  . simethicone (MYLICON) 80 MG chewable tablet Chew 80 mg by mouth every 6 (six) hours as needed. For gas        SURGICAL HISTORY:  Past Surgical History  Procedure Date  . Tubal ligation   . Carpal tunnel release   . Colonoscopy 07/26/2011    Procedure: COLONOSCOPY;  Surgeon: Arlyce Harman, MD;  Location: [facility];  Service: Endoscopy;  Laterality: N/A;  10:40  . Esophagogastroduodenoscopy 07/26/2011    Procedure: ESOPHAGOGASTRODUODENOSCOPY (EGD);  Surgeon: Arlyce Harman, MD;  Location: [facility];  Service: Endoscopy;  Laterality: N/A;  . Esophagogastroduodenoscopy 12/27/2011    Procedure: ESOPHAGOGASTRODUODENOSCOPY (EGD);  Surgeon: Shirley Friar, MD;  Location: [facility];  Service:  Endoscopy;  Laterality: N/A;  . Laparoscopic colon resection 12/14/11    diverting ileostomy  . Portacath placement 02/21/2012    Procedure: INSERTION PORT-A-CATH;  Surgeon: [name], MD;  Location: [facility];  Service: General;  Laterality: N/A;    REVIEW OF SYSTEMS:  Pertinent items are noted in HPI.   PHYSICAL EXAMINATION: General appearance: alert, cooperative, appears stated age, mild distress and pale Resp: clear to auscultation bilaterally and normal percussion bilaterally Cardio: regular rate and rhythm, S1, S2 normal, no murmur, click, rub or gallop GI: soft, non-tender; bowel sounds normal; no masses,  no organomegaly Extremities: extremities normal, atraumatic, no cyanosis or edema Neurologic: Alert and oriented X 3, normal strength and tone. Normal symmetric reflexes. Normal coordination and gait Rectal Exam: patient does not have any active bleeding, there is noted to be external hemorrhoids.  ECOG PERFORMANCE STATUS: 0 - Asymptomatic  Blood pressure 100/67, pulse 67, temperature 98.6 F (37 C), temperature source  Oral, height 4\' 9"  (1.448 m), weight 85 lb 1.6 oz (38.601 kg), last menstrual period 02/20/2012.  LABORATORY DATA: Lab Results  Component Value Date   WBC 3.5* 04/09/2012   HGB 10.5* 04/09/2012   HCT 30.1* 04/09/2012   MCV 85.8 04/09/2012   PLT 217 04/09/2012      Chemistry      Component Value Date/Time   NA 138 04/01/2012 1110   K 3.5 04/01/2012 1110   CL 105 04/01/2012 1110   CO2 24 04/01/2012 1110   BUN 8 04/01/2012 1110   CREATININE 0.72 04/01/2012 1110   CREATININE 0.70 07/27/2011 1306      Component Value Date/Time   CALCIUM 8.6 04/01/2012 1110   ALKPHOS 61 04/01/2012 1110   AST 24 04/01/2012 1110   ALT 29 04/01/2012 1110   BILITOT 0.5 04/01/2012 1110     ADDITIONAL INFORMATION: 1. Following placement in clearing solution, three more lymph nodes were identified, all of which are benign. Thus, the total lymph node count is 11. (JK:mw 12-18-11) 2. Following an  exhaustive search, additional tissue has been submitted in an attempt to recover lymph nodes. Two more lymph nodes were identified, both benign. This brings the total number of lymph nodes recovered to 13, all of which are benign. (JBK:eps 12/26/11) FINAL DIAGNOSIS Diagnosis 1. Colon, resection margin (donut), distal rectal - COLORECTAL MUCOSA WITH FIBROSIS AND EDEMA, CONSISTENT WITH TREATMENT. - THERE IS NO EVIDENCE OF MALIGNANCY. 2. Colon, total resection (incl lymph nodes) - COLON WITH SCATTERED TUBULAR ADENOMAS. - HIGH GRADE DYSPLASIA IS NOT IDENTIFIED. - BENIGN APPENDIX WITH FIBROUS OBLITERATION OF THE TIP. - THERE IS NO EVIDENCE OF CARCINOMA IN 8 OF 8 LYMPH NODES (0/8). - SEE ONCOLOGY TABLE BELOW. 3. Colon, resection margin (donut), new distal margin and rectum - BENIGN SQUAMOUS LINED MUCOSA. - THERE IS NO EVIDENCE OF MALIGNANCY. Microscopic Comment 2. COLON AND RECTUM Specimen: Colon, appendix, and terminal ileum. Procedure: Total colectomy. Tumor site: N/A Specimen integrity: Intact. Macroscopic intactness of mesorectum: Complete. Macroscopic tumor perforation: N/A Invasive tumor: N/A 1 of 3 FINAL for BERLINE, SEMRAD ([id number]) Microscopic Comment(continued) Microscopic extension of invasive tumor: N/A Lymph-Vascular invasion: N/A Peri-neural invasion: N/A Tumor deposit(s) (discontinuous extramural extension): N/A Resection margins: Negative for adenocarcinoma. Treatment effect (neoadjuvant therapy): Present, significant. Number of lymph nodes examined- 8 ; Number positive - 0 (PLEASE NOTE: Additional tissue will be submitted in an attempt to recover more lymph nodes) Additional polyp(s): Multiple scattered tubular adenomas. Pathologic Staging: ypT0, ypN0 Ancillary studies: N/A Comments: Grossly, there is a 1.5 cm granular depressed area of mucosa present at the distal portion of the specimen. This entire area has been submitted for histologic evaluation revealing  inflamed, fibrotic, and edematous colorectal mucosa, consistent with treatment effect. Adenocarcinoma is not identified. In addition, there are scattered tubular adenomas throughout the specimen. No high grade dysplasia is identified. The case was discussed with Dr. Donell Beers on 12/15/2011. (JBK:gt, 12/15/11) Pecola Leisure MD Pathologist, Electronic Signature (Case signed 12/15/2011) Valentino Hue  RADIOGRAPHIC STUDIES:  Nm Pet Image Initial (pi) Skull Base To Thigh 1.  Anorectal primary without evidence of distant metastasis. 2.  Perirectal/peritoneal nodes described on the prior diagnostic CT are less impressive today and may have undergone response to therapy.  Based on location, these were suspicious on the prior. 3.  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 Neoplasm cannot be excluded.  Consider further evaluation with ultrasound. 4.  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  Consider correlation with cardiac risk factors.  5.  Sinus disease.  Original Report Authenticated By: Consuello Bossier, M.D.     ASSESSMENT: 46 year old female with:  #1. stage III adenocarcinoma of the rectum. Patient is now status post definitive surgery after having received neoadjuvant chemotherapy and radiation therapy. Her final pathology does not reveal any evidence of residual disease. 8 nodes were negative for metastatic disease.  #2 . Patient was begun on adjuvant chemotherapy consisting of Xeloda and tape side of being. She does far has received one cycle. This course was complicated by development of a severe dehydration requiring hospitalization.  #3 patient is now here for cycle #2 of chemotherapy plan is to proceed with this on 04/12/2012. Patient will receive xeloda at 500 mg twice a day 14 days on 7 days off. She will receive oxaliplatin infusion on Friday of this week.  PLAN: #1 patient will proceed with her schedule cycle 2 of Xeloda and XL  he platen on Friday, 04/12/2012. With this she will receive IV fluids on 7/19 7/20, July 22 and July 23 as well as July 24.  #2 patient does develop some tingling at the infusion time of oxaliplatin I did recommend that patient takes a B complex and folic acid.  #3 patient knows to call me with any problems in between. I will also plan on seeing her back on 04/16/2012 for followup.  All questions were answered. The patient knows to call the clinic with any problems, questions or concerns. We can certainly see the patient much sooner if necessary.  I spent 30 minutes counseling the patient face to face. The total time spent in the appointment was 30 minutes.    Drue Second, MD Medical/Oncology [facility] [phone] (beeper) [phone] (Office)  04/09/2012, 11:17 AM

## 2012-04-09 NOTE — Telephone Encounter (Signed)
sent email to [facility] to add on iv fluids

## 2012-04-09 NOTE — Patient Instructions (Addendum)
1. Take B complex vitamins over the counter, folic acid overt the counter  2. Begin xeloda 500 mg twice a day starting on 7/19 - 8/1  3. You will come back on 7/19 for oxaliplatin chemotherapy  4. You receive IVF on 7/19, 7/20, 7/22, 7/23, 7/24.   5. I will see you back on 7/23 for follow up

## 2012-04-09 NOTE — Telephone Encounter (Signed)
gve the pt her July 2013 appt calendar °

## 2012-04-12 ENCOUNTER — Ambulatory Visit (HOSPITAL_BASED_OUTPATIENT_CLINIC_OR_DEPARTMENT_OTHER): Payer: BC Managed Care – PPO

## 2012-04-12 ENCOUNTER — Other Ambulatory Visit: Payer: Self-pay | Admitting: *Deleted

## 2012-04-12 VITALS — BP 109/66 | HR 64 | Temp 97.8°F

## 2012-04-12 DIAGNOSIS — C20 Malignant neoplasm of rectum: Secondary | ICD-10-CM

## 2012-04-12 DIAGNOSIS — Z5111 Encounter for antineoplastic chemotherapy: Secondary | ICD-10-CM

## 2012-04-12 MED ORDER — FAMOTIDINE IN NACL 20-0.9 MG/50ML-% IV SOLN
20.0000 mg | Freq: Once | INTRAVENOUS | Status: AC
  Administered 2012-04-12: 20 mg via INTRAVENOUS

## 2012-04-12 MED ORDER — HEPARIN SOD (PORK) LOCK FLUSH 100 UNIT/ML IV SOLN
500.0000 [IU] | Freq: Once | INTRAVENOUS | Status: AC | PRN
  Administered 2012-04-12: 500 [IU]
  Filled 2012-04-12: qty 5

## 2012-04-12 MED ORDER — DEXTROSE 5 % IV SOLN
130.0000 mg/m2 | Freq: Once | INTRAVENOUS | Status: AC
  Administered 2012-04-12: 170 mg via INTRAVENOUS
  Filled 2012-04-12: qty 34

## 2012-04-12 MED ORDER — ONDANSETRON 8 MG/50ML IVPB (CHCC)
8.0000 mg | Freq: Once | INTRAVENOUS | Status: AC
  Administered 2012-04-12: 8 mg via INTRAVENOUS

## 2012-04-12 MED ORDER — DEXTROSE 5 % IV SOLN
Freq: Once | INTRAVENOUS | Status: AC
  Administered 2012-04-12: 10:00:00 via INTRAVENOUS

## 2012-04-12 MED ORDER — METHYLPREDNISOLONE SODIUM SUCC 125 MG IJ SOLR
125.0000 mg | Freq: Once | INTRAMUSCULAR | Status: AC
  Administered 2012-04-12: 125 mg via INTRAVENOUS

## 2012-04-12 MED ORDER — DIPHENHYDRAMINE HCL 50 MG/ML IJ SOLN
50.0000 mg | Freq: Once | INTRAMUSCULAR | Status: AC
  Administered 2012-04-12: 50 mg via INTRAVENOUS

## 2012-04-12 MED ORDER — DEXAMETHASONE SODIUM PHOSPHATE 10 MG/ML IJ SOLN
10.0000 mg | Freq: Once | INTRAMUSCULAR | Status: AC
  Administered 2012-04-12: 10 mg via INTRAVENOUS

## 2012-04-12 MED ORDER — SODIUM CHLORIDE 0.9 % IJ SOLN
10.0000 mL | INTRAMUSCULAR | Status: DC | PRN
  Administered 2012-04-12 (×2): 10 mL
  Filled 2012-04-12: qty 10

## 2012-04-12 MED ORDER — LORAZEPAM 2 MG/ML IJ SOLN
0.2500 mg | Freq: Once | INTRAMUSCULAR | Status: AC
  Administered 2012-04-12: 0.26 mg via INTRAVENOUS

## 2012-04-12 NOTE — Patient Instructions (Signed)
[facility] Discharge Instructions for Patients Receiving Chemotherapy  Today you received the following chemotherapy agent Oxaliplatin.  To help prevent nausea and vomiting after your treatment, we encourage you to take your nausea medication. Begin taking it as often as prescribed for by Dr. Welton Flakes.    If you develop nausea and vomiting that is not controlled by your nausea medication, call the clinic. If it is after clinic hours your family physician or the after hours number for the clinic or go to the Emergency Department.   BELOW ARE SYMPTOMS THAT SHOULD BE REPORTED IMMEDIATELY:  *FEVER GREATER THAN 100.5 F  *CHILLS WITH OR WITHOUT FEVER  NAUSEA AND VOMITING THAT IS NOT CONTROLLED WITH YOUR NAUSEA MEDICATION  *UNUSUAL SHORTNESS OF BREATH  *UNUSUAL BRUISING OR BLEEDING  TENDERNESS IN MOUTH AND THROAT WITH OR WITHOUT PRESENCE OF ULCERS  *URINARY PROBLEMS  *BOWEL PROBLEMS  UNUSUAL RASH Items with * indicate a potential emergency and should be followed up as soon as possible.  One of the nurses will contact you 24 hours after your treatment. Please let the nurse know about any problems that you may have experienced. Feel free to call the clinic you have any questions or concerns. The clinic phone number is [phone].   I have been informed and understand all the instructions given to me. I know to contact the clinic, my physician, or go to the Emergency Department if any problems should occur. I do not have any questions at this time, but understand that I may call the clinic during office hours   should I have any questions or need assistance in obtaining follow up care.    __________________________________________  _____________  __________ Signature of Patient or Authorized Representative            Date                   Time    __________________________________________ Nurse's Signature     OXALIPLATIN (ox AL i PLA tin) is a chemotherapy  drug. It targets fast dividing cells, like cancer cells, and causes these cells to die. This medicine is used to treat cancers of the colon and rectum, and many other cancers. This medicine may be used for other purposes; ask your health care provider or pharmacist if you have questions. What should I tell my health care provider before I take this medicine? They need to know if you have any of these conditions: -kidney disease -an unusual or allergic reaction to oxaliplatin, other chemotherapy, other medicines, foods, dyes, or preservatives -pregnant or trying to get pregnant -breast-feeding How should I use this medicine? This drug is given as an infusion into a vein. It is administered in a hospital or clinic by a specially trained health care professional. Talk to your pediatrician regarding the use of this medicine in children. Special care may be needed. Overdosage: If you think you have taken too much of this medicine contact a poison control center or emergency room at once. NOTE: This medicine is only for you. Do not share this medicine with others. What if I miss a dose? It is important not to miss a dose. Call your doctor or health care professional if you are unable to keep an appointment. What may interact with this medicine? -medicines to increase blood counts like filgrastim, pegfilgrastim, sargramostim -probenecid -some antibiotics like amikacin, gentamicin, neomycin, polymyxin B, streptomycin, tobramycin -zalcitabine Talk to your doctor or health care professional before taking any of  these medicines: -acetaminophen -aspirin -ibuprofen -ketoprofen -naproxen This list may not describe all possible interactions. Give your health care provider a list of all the medicines, herbs, non-prescription drugs, or dietary supplements you use. Also tell them if you smoke, drink alcohol, or use illegal drugs. Some items may interact with your medicine. What should I watch for while  using this medicine? Your condition will be monitored carefully while you are receiving this medicine. You will need important blood work done while you are taking this medicine. This medicine can make you more sensitive to cold. Do not drink cold drinks or use ice. Cover exposed skin before coming in contact with cold temperatures or cold objects. When out in cold weather wear warm clothing and cover your mouth and nose to warm the air that goes into your lungs. Tell your doctor if you get sensitive to the cold. This drug may make you feel generally unwell. This is not uncommon, as chemotherapy can affect healthy cells as well as cancer cells. Report any side effects. Continue your course of treatment even though you feel ill unless your doctor tells you to stop. In some cases, you may be given additional medicines to help with side effects. Follow all directions for their use. Call your doctor or health care professional for advice if you get a fever, chills or sore throat, or other symptoms of a cold or flu. Do not treat yourself. This drug decreases your body's ability to fight infections. Try to avoid being around people who are sick. This medicine may increase your risk to bruise or bleed. Call your doctor or health care professional if you notice any unusual bleeding. Be careful brushing and flossing your teeth or using a toothpick because you may get an infection or bleed more easily. If you have any dental work done, tell your dentist you are receiving this medicine. Avoid taking products that contain aspirin, acetaminophen, ibuprofen, naproxen, or ketoprofen unless instructed by your doctor. These medicines may hide a fever. Do not become pregnant while taking this medicine. Women should inform their doctor if they wish to become pregnant or think they might be pregnant. There is a potential for serious side effects to an unborn child. Talk to your health care professional or pharmacist for more  information. Do not breast-feed an infant while taking this medicine. Call your doctor or health care professional if you get diarrhea. Do not treat yourself. What side effects may I notice from receiving this medicine? Side effects that you should report to your doctor or health care professional as soon as possible: -allergic reactions like skin rash, itching or hives, swelling of the face, lips, or tongue -low blood counts - This drug may decrease the number of white blood cells, red blood cells and platelets. You may be at increased risk for infections and bleeding. -signs of infection - fever or chills, cough, sore throat, pain or difficulty passing urine -signs of decreased platelets or bleeding - bruising, pinpoint red spots on the skin, black, tarry stools, nosebleeds -signs of decreased red blood cells - unusually weak or tired, fainting spells, lightheadedness -breathing problems -chest pain, pressure -cough -diarrhea -jaw tightness -mouth sores -nausea and vomiting -pain, swelling, redness or irritation at the injection site -pain, tingling, numbness in the hands or feet -problems with balance, talking, walking -redness, blistering, peeling or loosening of the skin, including inside the mouth -trouble passing urine or change in the amount of urine Side effects that usually do not require  medical attention (report to your doctor or health care professional if they continue or are bothersome): -changes in vision -constipation -hair loss -loss of appetite -metallic taste in the mouth or changes in taste -stomach pain This list may not describe all possible side effects. Call your doctor for medical advice about side effects. You may report side effects to FDA at 1-800-FDA-1088. Where should I keep my medicine? This drug is given in a hospital or clinic and will not be stored at home. NOTE: This sheet is a summary. It may not cover all possible information. If you have questions  about this medicine, talk to your doctor, pharmacist, or health care provider.  2012, [employer]. (04/07/2008 5:22:47 PM)

## 2012-04-12 NOTE — Progress Notes (Signed)
1310 Patient c/o Nausea. Asked patient if she wished for additional nausea medication, patient refused. VSS. Oxaliplatin completed at 1250 and patient with no complaints or problems during infusion. Patient line flushing at this time. Family at chairside.  1320 Patient c/o difficulty speaking and tongue feeling swollen. VSS, O2 saturation at 100%. Slurred speech noticed. Charge nurse notified and reaction kit/treatment initiated (see MAR for drug admin times). Dr. Welton Flakes notified.   1323 Dr. Welton Flakes at chairside. Solumedrol admin. Patient wrapped in warm blankets. O2 started @ 2L @ patient's request. Normal saline running @ 136ml/hr. Patient being  monitored closely.  1400 Patient continues to c/o of some tongue swelling. V/S continue to be stable.Dr. Welton Flakes informed of patient's condition. Stated to continue monitoring patient.  Patient offered cup of hot water to breath in warm air. Will continue to monitor closely.  1420 Patient states warm water helping.  [id number] Patient with no complaints at this time other than feeling "sleepy". VSS. Dr. Welton Flakes informed of patients condition and would like for nursing staff to continue monitoring patient.   1610 Ok to discharge patient per Dr. Welton Flakes. VSS. Patient stated she felt much better, able to breathe, swallow without difficulty and no c/o of tongue swelling. Patient and daughter educated on prevention to cold sensitivity and what to do in case of reaction to cold sensitivity, both verbalized understanding. Patient also informed to call [phone] or go to Emergency department if any problems should occur, patient and daughter verbalized understanding. Patient discharged in wheelchair with daughter and female relative.

## 2012-04-13 ENCOUNTER — Ambulatory Visit (HOSPITAL_BASED_OUTPATIENT_CLINIC_OR_DEPARTMENT_OTHER): Payer: BC Managed Care – PPO

## 2012-04-13 VITALS — BP 95/89 | HR 68 | Temp 97.6°F

## 2012-04-13 DIAGNOSIS — Z932 Ileostomy status: Secondary | ICD-10-CM

## 2012-04-13 DIAGNOSIS — Z5189 Encounter for other specified aftercare: Secondary | ICD-10-CM

## 2012-04-13 DIAGNOSIS — C20 Malignant neoplasm of rectum: Secondary | ICD-10-CM

## 2012-04-13 DIAGNOSIS — R198 Other specified symptoms and signs involving the digestive system and abdomen: Secondary | ICD-10-CM

## 2012-04-13 MED ORDER — SODIUM CHLORIDE 0.9 % IV SOLN: INTRAVENOUS | Status: DC

## 2012-04-13 MED ORDER — SODIUM CHLORIDE 0.9 % IV SOLN
INTRAVENOUS | Status: DC
  Administered 2012-04-13: 10:00:00 via INTRAVENOUS

## 2012-04-13 NOTE — Progress Notes (Signed)
C/o dizziness on arrival.  Tolerated treatment well.  Discharged to home withi family.

## 2012-04-15 ENCOUNTER — Ambulatory Visit (HOSPITAL_BASED_OUTPATIENT_CLINIC_OR_DEPARTMENT_OTHER): Payer: BC Managed Care – PPO

## 2012-04-15 ENCOUNTER — Telehealth: Payer: Self-pay | Admitting: *Deleted

## 2012-04-15 VITALS — BP 109/71 | HR 69 | Temp 96.3°F

## 2012-04-15 DIAGNOSIS — E860 Dehydration: Secondary | ICD-10-CM

## 2012-04-15 DIAGNOSIS — Z932 Ileostomy status: Secondary | ICD-10-CM

## 2012-04-15 DIAGNOSIS — C20 Malignant neoplasm of rectum: Secondary | ICD-10-CM

## 2012-04-15 MED ORDER — SODIUM CHLORIDE 0.9 % IV SOLN
Freq: Once | INTRAVENOUS | Status: AC
  Administered 2012-04-15: 15:00:00 via INTRAVENOUS

## 2012-04-15 NOTE — Patient Instructions (Addendum)
Dehydration, Adult Dehydration means your body does not have as much fluid as it needs. Your kidneys, brain, and heart will not work properly without the right amount of fluids and salt.  HOME CARE  Ask your doctor how to replace body fluid losses (rehydrate).   Drink enough fluids to keep your pee (urine) clear or pale yellow.   Drink small amounts of fluids often if you feel sick to your stomach (nauseous) or throw up (vomit).   Eat like you normally do.   Avoid:   Foods or drinks high in sugar.   Bubbly (carbonated) drinks.   Juice.   Very hot or cold fluids.   Drinks with caffeine.   Fatty, greasy foods.   Alcohol.   Tobacco.   Eating too much.   Gelatin desserts.   Wash your hands to avoid spreading germs (bacteria, viruses).   Only take medicine as told by your doctor.   Keep all doctor visits as told.  GET HELP RIGHT AWAY IF:   You cannot drink something without throwing up.   You get worse even with treatment.   Your vomit has blood in it or looks greenish.   Your poop (stool) has blood in it or looks black and tarry.   You have not peed in 6 to 8 hours.   You pee a small amount of very dark pee.   You have a fever.   You pass out (faint).   You have belly (abdominal) pain that gets worse or stays in one spot (localizes).   You have a rash, stiff neck, or bad headache.   You get easily annoyed, sleepy, or are hard to wake up.   You feel weak, dizzy, or very thirsty.  MAKE SURE YOU:   Understand these instructions.   Will watch your condition.   Will get help right away if you are not doing well or get worse.  Document Released: 07/08/2009 Document Revised: 08/31/2011 Document Reviewed: 05/01/2011 [employer] Patient Information 2012 [employer]. 

## 2012-04-15 NOTE — Telephone Encounter (Signed)
Patient's husband called regarding her appts this week. They wanted to come in earlier today, per schedule I have no open spaces. They also wanted to move the appts for this week up also. The appt for tomorrow 7/23, I have no open spaces after MD visit until 2pm. I have told the husband that we will try to move up/work in if we have cancellation. I have moved 7/24 appt up, husband aware.  JMW

## 2012-04-16 ENCOUNTER — Ambulatory Visit: Payer: BC Managed Care – PPO

## 2012-04-16 ENCOUNTER — Other Ambulatory Visit (HOSPITAL_BASED_OUTPATIENT_CLINIC_OR_DEPARTMENT_OTHER): Payer: BC Managed Care – PPO | Admitting: Lab

## 2012-04-16 ENCOUNTER — Ambulatory Visit (HOSPITAL_BASED_OUTPATIENT_CLINIC_OR_DEPARTMENT_OTHER): Payer: BC Managed Care – PPO

## 2012-04-16 ENCOUNTER — Ambulatory Visit (HOSPITAL_BASED_OUTPATIENT_CLINIC_OR_DEPARTMENT_OTHER): Payer: BC Managed Care – PPO | Admitting: Oncology

## 2012-04-16 ENCOUNTER — Encounter: Payer: Self-pay | Admitting: Oncology

## 2012-04-16 VITALS — BP 114/71 | HR 64 | Temp 98.1°F | Ht <= 58 in | Wt 83.4 lb

## 2012-04-16 DIAGNOSIS — C20 Malignant neoplasm of rectum: Secondary | ICD-10-CM

## 2012-04-16 DIAGNOSIS — Z932 Ileostomy status: Secondary | ICD-10-CM

## 2012-04-16 DIAGNOSIS — E876 Hypokalemia: Secondary | ICD-10-CM

## 2012-04-16 DIAGNOSIS — R198 Other specified symptoms and signs involving the digestive system and abdomen: Secondary | ICD-10-CM

## 2012-04-16 LAB — CBC WITH DIFFERENTIAL/PLATELET
Basophils Absolute: 0 10*3/uL (ref 0.0–0.1)
EOS%: 9.7 % — ABNORMAL HIGH (ref 0.0–7.0)
Eosinophils Absolute: 0.3 10*3/uL (ref 0.0–0.5)
HCT: 31 % — ABNORMAL LOW (ref 34.8–46.6)
HGB: 10.9 g/dL — ABNORMAL LOW (ref 11.6–15.9)
MCH: 29.8 pg (ref 25.1–34.0)
MCV: 84.7 fL (ref 79.5–101.0)
MONO%: 12.3 % (ref 0.0–14.0)
NEUT#: 1.3 10*3/uL — ABNORMAL LOW (ref 1.5–6.5)
NEUT%: 46.3 % (ref 38.4–76.8)
RDW: 14.7 % — ABNORMAL HIGH (ref 11.2–14.5)

## 2012-04-16 LAB — CEA: CEA: <0.5 ng/mL (ref 0.0–5.0)

## 2012-04-16 LAB — COMPREHENSIVE METABOLIC PANEL
Albumin: 3.9 g/dL (ref 3.5–5.2)
BUN: 10 mg/dL (ref 6–23)
CO2: 26 mEq/L (ref 19–32)
Glucose, Bld: 82 mg/dL (ref 70–99)
Potassium: 3.4 mEq/L — ABNORMAL LOW (ref 3.5–5.3)
Sodium: 143 mEq/L (ref 135–145)
Total Protein: 6.2 g/dL (ref 6.0–8.3)

## 2012-04-16 MED ORDER — SODIUM CHLORIDE 0.9 % IV SOLN
INTRAVENOUS | Status: DC
  Administered 2012-04-16: 1000 mL via INTRAVENOUS

## 2012-04-16 NOTE — Patient Instructions (Addendum)
[facility] Discharge Instructions for Patients Receiving Chemotherapy  Today you received the following Normal Saline  To help prevent nausea and vomiting after your treatment, we encourage you to take your nausea medication Begin taking it at 7 pm and take it as often as prescribed for the next 24 to 72 hours.   If you develop nausea and vomiting that is not controlled by your nausea medication, call the clinic. If it is after clinic hours your family physician or the after hours number for the clinic or go to the Emergency Department.   BELOW ARE SYMPTOMS THAT SHOULD BE REPORTED IMMEDIATELY:  *FEVER GREATER THAN 100.5 F  *CHILLS WITH OR WITHOUT FEVER  NAUSEA AND VOMITING THAT IS NOT CONTROLLED WITH YOUR NAUSEA MEDICATION  *UNUSUAL SHORTNESS OF BREATH  *UNUSUAL BRUISING OR BLEEDING  TENDERNESS IN MOUTH AND THROAT WITH OR WITHOUT PRESENCE OF ULCERS  *URINARY PROBLEMS  *BOWEL PROBLEMS  UNUSUAL RASH Items with * indicate a potential emergency and should be followed up as soon as possible.  One of the nurses will contact you 24 hours after your treatment. Please let the nurse know about any problems that you may have experienced. Feel free to call the clinic you have any questions or concerns. The clinic phone number is [phone].   I have been informed and understand all the instructions given to me. I know to contact the clinic, my physician, or go to the Emergency Department if any problems should occur. I do not have any questions at this time, but understand that I may call the clinic during office hours   should I have any questions or need assistance in obtaining follow up care.    __________________________________________  _____________  __________ Signature of Patient or Authorized Representative            Date                   Time    __________________________________________ Nurse's Signature    

## 2012-04-16 NOTE — Patient Instructions (Addendum)
1.Proceed with IVF today.  2. Do not take anymore xeloda for now  3. i will see you back in 1 week

## 2012-04-16 NOTE — Progress Notes (Signed)
OFFICE PROGRESS NOTE  CC: Renee Eva, MD [name], MD [name], MD [name], MD [address] 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  PRIOR THERAPY:   #1 patient presented with a several month history of diarrhea and then subsequent bleeding. She was found to be anemic with a hemoglobin of 10. Because of this she went on to have a colonoscopy performed. The colonoscopy showed multiple sessile polyps in the cecum ascending transverse and sigmoid colon as well as the rectum. The rectal area showed a large exophytic rectal mass approximately 1 cm above the dentate line measuring 3-4 cm. She had a biopsy of this performed and was found to be an adenocarcinoma consistent with a rectal primary.  #2 patient was seen by Dr. [name] at [facility] who performed a rectal ultrasound. The staging clinically was T3 N1. It was recommended by Dr. [name] the patient undergo neoadjuvant chemotherapy and radiation higher to her definitive surgery.  #3 Patient has completed concurrent radiation and chemotherapy. Her chemotherapy consisted of single agent xeloda administrated 08/22/11 - 10/09/2011. Overall she tolerated her treatment very well  #4 Has completed all of neoadjuvant treatment And the PET scan shows no evidence of distant metastasis and a para rectal peritoneal nodes less impressive she has had significant response to therapy.  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 APC is negative  #6 patient had a laparotomy with proctocolectomy, J-pouch with ileoanal anastomosis, loop ileostomy on 12/14/2011. The final pathology did not reveal any evidence of residual disease. Patient had a  complicated postop course including high output ileostomy. She required intensive IV fluids on an outpatient basis to prevent dehydration.  #7 patient began adjuvant chemotherapy consisting of XELOX starting 03/05/2012 on a 21 day cycle. A total of 6 cycles are planned.  CURRENT THERAPY: Patient is s/p  cycle #2 of Xeloda and oxaliplatin.  INTERVAL HISTORY: Renee Nicholson 45 y.o. female returns for followup visit today.Today overall she is doing well. However she has noticed peeling and desquamation of the skin of her soles of her feet. She otherwise has no other redness no tenderness she has no difficulty in walking..She has been trying to eat more. She denies any nausea vomiting fevers chills headaches shortness of breath chest pains or palpitations no peripheral paresthesias. Remainder of the 10 point review of systems is negative.  MEDICAL HISTORY: Past Medical History  Diagnosis Date  . Anemia   . Diarrhea   . Rectal cancer 08/16/2011  . Colon cancer   . Anxiety   . Bright red rectal bleeding 09/13/2011  . History of chemotherapy     completed 09/2011   . Hx of radiation therapy 08/29/11 to 10/09/11    rectum    ALLERGIES:   has no known allergies.  MEDICATIONS:  Current Outpatient Prescriptions  Medication Sig Dispense Refill  . acetaminophen (TYLENOL) 325 MG tablet Take 2 tablets (650 mg total) by mouth every 4 (four) hours as needed.  200 tablet  1  . dexamethasone (DECADRON) 4 MG tablet Take 2 tablets by mouth daily starting the day after chemotherapy for 2 days. Take with food.  30 tablet  1  . feeding supplement (ENSURE COMPLETE) LIQD Take  237 mLs by mouth 3 (three) times daily between meals.  50 Bottle  6  . feeding supplement (RESOURCE BREEZE) LIQD Take 1 Container by mouth 3 (three) times daily between meals.  30 Container  6  . Ferrous Sulfate (IRON) 28 MG TABS Take 1 tablet by mouth daily.      . Heparin Lock Flush (HEPARIN FLUSH, PORCINE,) 100 UNIT/ML injection 1 mL (100  Units total) by Intracatheter route as needed (250 units per lumen).  5 Syringe  3  . HYDROcodone-acetaminophen (VICODIN) 5-500 MG per tablet Take 1-2 tablets by mouth every 6 (six) hours as needed for pain.  30 tablet  0  . lidocaine-prilocaine (EMLA) cream Apply topically as needed.  30 g  6  . loperamide (IMODIUM) 2 MG capsule Take 2 capsules by mouth Every 4 hours as needed.      . megestrol (MEGACE) 400 MG/10ML suspension Take 10 mLs (400 mg total) by mouth 2 (two) times daily.  240 mL  1  . Multiple Vitamin (MULTIVITAMIN) tablet Take 1 tablet by mouth every morning.       . pantoprazole (PROTONIX) 40 MG tablet Take 40 mg by mouth daily.      . polysaccharide iron (NIFEREX) 150 MG CAPS capsule Take 150 mg by mouth every morning.       . prochlorperazine (COMPAZINE) 10 MG tablet Take 1 tablet (10 mg total) by mouth every 6 (six) hours as needed (Nausea or vomiting).  30 tablet  1  . prochlorperazine (COMPAZINE) 25 MG suppository Place 1 suppository (25 mg total) rectally every 12 (twelve) hours as needed for nausea.  12 suppository  3  . simethicone (MYLICON) 80 MG chewable tablet Chew 80 mg by mouth every 6 (six) hours as needed. For gas       No current facility-administered medications for this visit.   Facility-Administered Medications Ordered in Other Visits  Medication Dose Route Frequency Provider Last Rate Last Dose  . 0.9 %  sodium chloride infusion   Intravenous Once Victorino December, MD        SURGICAL HISTORY:  Past Surgical History  Procedure Date  . Tubal ligation   . Carpal tunnel release   . Colonoscopy 07/26/2011    Procedure: COLONOSCOPY;  Surgeon: Arlyce Harman, MD;  Location: [facility];  Service: Endoscopy;  Laterality: N/A;  10:40  . Esophagogastroduodenoscopy 07/26/2011    Procedure: ESOPHAGOGASTRODUODENOSCOPY (EGD);  Surgeon: Arlyce Harman, MD;  Location: [facility];  Service: Endoscopy;  Laterality: N/A;  . Esophagogastroduodenoscopy 12/27/2011     Procedure: ESOPHAGOGASTRODUODENOSCOPY (EGD);  Surgeon: Shirley Friar, MD;  Location: [facility];  Service: Endoscopy;  Laterality: N/A;  . Laparoscopic colon resection 12/14/11    diverting ileostomy  . Portacath placement 02/21/2012    Procedure: INSERTION PORT-A-CATH;  Surgeon: [name], MD;  Location: [facility] OR;  Service: General;  Laterality: N/A;    REVIEW OF SYSTEMS:  Pertinent items are noted in HPI.   PHYSICAL EXAMINATION: General appearance: alert, cooperative, appears stated age, mild distress and pale Resp: clear to auscultation bilaterally and normal percussion bilaterally Cardio: regular rate and rhythm, S1, S2 normal, no murmur, click, rub or gallop GI: soft, non-tender; bowel sounds normal; no masses,  no organomegaly Extremities: extremities normal, atraumatic, no cyanosis or edema Neurologic: Alert and oriented X 3, normal strength and tone. Normal symmetric reflexes. Normal coordination and gait Rectal Exam: patient does not have any active bleeding, there is noted to be external  hemorrhoids.  ECOG PERFORMANCE STATUS: 0 - Asymptomatic  Blood pressure 114/71, pulse 64, temperature 98.1 F (36.7 C), temperature source Oral, height 4\' 9"  (1.448 m), weight 83 lb 6.4 oz (37.83 kg), last menstrual period 02/20/2012.  LABORATORY DATA: Lab Results  Component Value Date   WBC 2.8* 04/16/2012   HGB 10.9* 04/16/2012   HCT 31.0* 04/16/2012   MCV 84.7 04/16/2012   PLT 215 04/16/2012      Chemistry      Component Value Date/Time   NA 140 04/09/2012 1028   K 3.8 04/09/2012 1028   CL 106 04/09/2012 1028   CO2 26 04/09/2012 1028   BUN 12 04/09/2012 1028   CREATININE 0.61 04/09/2012 1028   CREATININE 0.70 07/27/2011 1306      Component Value Date/Time   CALCIUM 8.9 04/09/2012 1028   ALKPHOS 64 04/09/2012 1028   AST 28 04/09/2012 1028   ALT 24 04/09/2012 1028   BILITOT 0.6 04/09/2012 1028     ADDITIONAL INFORMATION: 1. Following placement in clearing solution, three more lymph  nodes were identified, all of which are benign. Thus, the total lymph node count is 11. (JK:mw 12-18-11) 2. Following an exhaustive search, additional tissue has been submitted in an attempt to recover lymph nodes. Two more lymph nodes were identified, both benign. This brings the total number of lymph nodes recovered to 13, all of which are benign. (JBK:eps 12/26/11) FINAL DIAGNOSIS Diagnosis 1. Colon, resection margin (donut), distal rectal - COLORECTAL MUCOSA WITH FIBROSIS AND EDEMA, CONSISTENT WITH TREATMENT. - THERE IS NO EVIDENCE OF MALIGNANCY. 2. Colon, total resection (incl lymph nodes) - COLON WITH SCATTERED TUBULAR ADENOMAS. - HIGH GRADE DYSPLASIA IS NOT IDENTIFIED. - BENIGN APPENDIX WITH FIBROUS OBLITERATION OF THE TIP. - THERE IS NO EVIDENCE OF CARCINOMA IN 8 OF 8 LYMPH NODES (0/8). - SEE ONCOLOGY TABLE BELOW. 3. Colon, resection margin (donut), new distal margin and rectum - BENIGN SQUAMOUS LINED MUCOSA. - THERE IS NO EVIDENCE OF MALIGNANCY. Microscopic Comment 2. COLON AND RECTUM Specimen: Colon, appendix, and terminal ileum. Procedure: Total colectomy. Tumor site: N/A Specimen integrity: Intact. Macroscopic intactness of mesorectum: Complete. Macroscopic tumor perforation: N/A Invasive tumor: N/A 1 of 3 FINAL for Renee Nicholson, Renee Nicholson ([id number]) Microscopic Comment(continued) Microscopic extension of invasive tumor: N/A Lymph-Vascular invasion: N/A Peri-neural invasion: N/A Tumor deposit(s) (discontinuous extramural extension): N/A Resection margins: Negative for adenocarcinoma. Treatment effect (neoadjuvant therapy): Present, significant. Number of lymph nodes examined- 8 ; Number positive - 0 (PLEASE NOTE: Additional tissue will be submitted in an attempt to recover more lymph nodes) Additional polyp(s): Multiple scattered tubular adenomas. Pathologic Staging: ypT0, ypN0 Ancillary studies: N/A Comments: Grossly, there is a 1.5 cm granular depressed area of mucosa  present at the distal portion of the specimen. This entire area has been submitted for histologic evaluation revealing inflamed, fibrotic, and edematous colorectal mucosa, consistent with treatment effect. Adenocarcinoma is not identified. In addition, there are scattered tubular adenomas throughout the specimen. No high grade dysplasia is identified. The case was discussed with Dr. Donell Beers on 12/15/2011. (JBK:gt, 12/15/11) Pecola Leisure MD Pathologist, Electronic Signature (Case signed 12/15/2011) Valentino Hue  RADIOGRAPHIC STUDIES:  Nm Pet Image Initial (pi) Skull Base To Thigh 1.  Anorectal primary without evidence of distant metastasis. 2.  Perirectal/peritoneal nodes described on the prior diagnostic CT are less impressive today and may have undergone response to therapy.  Based on location, these were suspicious on the prior. 3.  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 Neoplasm cannot be  excluded.  Consider further evaluation with ultrasound. 4.  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  Consider correlation with cardiac risk factors.  5.  Sinus disease.  Original Report Authenticated By: Consuello Bossier, M.D.     ASSESSMENT: 46 year old female with:  #1. stage III adenocarcinoma of the rectum. Patient is now status post definitive surgery after having received neoadjuvant chemotherapy and radiation therapy. Her final pathology does not reveal any evidence of residual disease. 8 nodes were negative for metastatic disease.  #2 . Patient was begun on adjuvant chemotherapy consisting of Xeloda and tape side of being. She does far has received one cycle. This course was complicated by development of a severe dehydration requiring hospitalization.  #3 patient is now s/p cycle #2 of Oxaliplatin on Friday, 04/12/2012. Unfortunately she developed swelling of her tongue secondary to ingesting cold fluids.She recovered  uneventfully.  #3 patient is on slowed 500 mg twice a day however she is now developing desquamation of the skin of her feet. But she has no pain no palmar desquamation no peripheral paresthesias she denies any diarrhea or nausea.  PLAN: #1 We will hold Xeloda for the next 7 days.  #2 she will return on 04/23/2012 for next oxaliplatin treatment  All questions were answered. The patient knows to call the clinic with any problems, questions or concerns. We can certainly see the patient much sooner if necessary.  I spent 30 minutes counseling the patient face to face. The total time spent in the appointment was 30 minutes.    Drue Second, MD Medical/Oncology [facility] [phone] (beeper) [phone] (Office)  04/16/2012, 12:09 PM

## 2012-04-17 ENCOUNTER — Telehealth: Payer: Self-pay | Admitting: Medical Oncology

## 2012-04-17 ENCOUNTER — Ambulatory Visit: Payer: BC Managed Care – PPO

## 2012-04-17 DIAGNOSIS — R198 Other specified symptoms and signs involving the digestive system and abdomen: Secondary | ICD-10-CM

## 2012-04-17 DIAGNOSIS — C20 Malignant neoplasm of rectum: Secondary | ICD-10-CM

## 2012-04-17 MED ORDER — SODIUM CHLORIDE 0.9 % IV SOLN: INTRAVENOUS | Status: DC

## 2012-04-17 NOTE — Telephone Encounter (Signed)
Message copied by Tylene Fantasia on Wed Apr 17, 2012  8:58 AM ------      Message from: Renee Nicholson      Created: Wed Apr 17, 2012  7:33 AM       Please call patient: eat more bananas as potassium low

## 2012-04-17 NOTE — Patient Instructions (Signed)
Pt will call with problems 

## 2012-04-17 NOTE — Telephone Encounter (Signed)
Spoke to patient in infusion room regarding low potassium, per MD patient to eat more bananas.  Patient expressed understanding, no questions at this time.

## 2012-04-19 ENCOUNTER — Telehealth: Payer: Self-pay | Admitting: Oncology

## 2012-04-19 NOTE — Telephone Encounter (Signed)
S/w the pt's brother regarding the foot appt with dr Velna Hatchet on 04/26/2012@10 :00am

## 2012-04-23 ENCOUNTER — Telehealth: Payer: Self-pay | Admitting: *Deleted

## 2012-04-23 ENCOUNTER — Ambulatory Visit (HOSPITAL_BASED_OUTPATIENT_CLINIC_OR_DEPARTMENT_OTHER): Payer: BC Managed Care – PPO | Admitting: Oncology

## 2012-04-23 ENCOUNTER — Telehealth: Payer: Self-pay | Admitting: Medical Oncology

## 2012-04-23 ENCOUNTER — Ambulatory Visit: Payer: BC Managed Care – PPO

## 2012-04-23 ENCOUNTER — Other Ambulatory Visit (HOSPITAL_BASED_OUTPATIENT_CLINIC_OR_DEPARTMENT_OTHER): Payer: BC Managed Care – PPO | Admitting: Lab

## 2012-04-23 ENCOUNTER — Ambulatory Visit (HOSPITAL_COMMUNITY)
Admission: RE | Admit: 2012-04-23 | Discharge: 2012-04-23 | Disposition: A | Discharge status: Home / Self Care | Payer: BC Managed Care – PPO | Source: Ambulatory Visit | Attending: Oncology | Admitting: Oncology

## 2012-04-23 VITALS — BP 116/69 | HR 64 | Temp 98.0°F | Ht <= 58 in | Wt 83.1 lb

## 2012-04-23 DIAGNOSIS — C20 Malignant neoplasm of rectum: Secondary | ICD-10-CM

## 2012-04-23 DIAGNOSIS — M549 Dorsalgia, unspecified: Secondary | ICD-10-CM

## 2012-04-23 DIAGNOSIS — C218 Malignant neoplasm of overlapping sites of rectum, anus and anal canal: Secondary | ICD-10-CM

## 2012-04-23 DIAGNOSIS — M545 Low back pain, unspecified: Secondary | ICD-10-CM | POA: Insufficient documentation

## 2012-04-23 LAB — COMPREHENSIVE METABOLIC PANEL
ALT: 39 U/L — ABNORMAL HIGH (ref 0–35)
Albumin: 4.1 g/dL (ref 3.5–5.2)
CO2: 27 mEq/L (ref 19–32)
Calcium: 9 mg/dL (ref 8.4–10.5)
Chloride: 104 mEq/L (ref 96–112)
Glucose, Bld: 97 mg/dL (ref 70–99)
Potassium: 3.5 mEq/L (ref 3.5–5.3)
Sodium: 140 mEq/L (ref 135–145)
Total Bilirubin: 0.7 mg/dL (ref 0.3–1.2)
Total Protein: 6.3 g/dL (ref 6.0–8.3)

## 2012-04-23 LAB — CBC WITH DIFFERENTIAL/PLATELET
BASO%: 0.5 % (ref 0.0–2.0)
Eosinophils Absolute: 0.1 10*3/uL (ref 0.0–0.5)
MCHC: 33.7 g/dL (ref 31.5–36.0)
MONO#: 0.5 10*3/uL (ref 0.1–0.9)
NEUT#: 1.9 10*3/uL (ref 1.5–6.5)
RBC: 3.63 10*6/uL — ABNORMAL LOW (ref 3.70–5.45)
RDW: 15.4 % — ABNORMAL HIGH (ref 11.2–14.5)
WBC: 3.2 10*3/uL — ABNORMAL LOW (ref 3.9–10.3)
lymph#: 0.6 10*3/uL — ABNORMAL LOW (ref 0.9–3.3)

## 2012-04-23 NOTE — Progress Notes (Signed)
OFFICE PROGRESS NOTE  CC: [name], MD [name], MD [name], MD [name], MD [address] [place] [address] [name], MD  DIAGNOSIS: 46 year old female with new diagnosis of rectal carcinoma by rectal ultrasound she was found to have a T3 N1 lesion.  PRIOR THERAPY:   #1 patient presented with a several month history of diarrhea and then subsequent bleeding. She was found to be anemic with a hemoglobin of 10. Because of this she went on to have a colonoscopy performed. The colonoscopy showed multiple sessile polyps in the cecum ascending transverse and sigmoid colon as well as the rectum. The rectal area showed a large exophytic rectal mass approximately 1 cm above the dentate line measuring 3-4 cm. She had a biopsy of this performed and was found to be an adenocarcinoma consistent with a rectal primary.  #2 patient was seen by Dr. [name] at [facility] who performed a rectal ultrasound. The staging clinically was T3 N1. It was recommended by Dr. [name] the patient undergo neoadjuvant chemotherapy and radiation higher to her definitive surgery.  #3 Patient has completed concurrent radiation and chemotherapy. Her chemotherapy consisted of single agent xeloda administrated 08/22/11 - 10/09/2011. Overall she tolerated her treatment very well  #4 Has completed all of neoadjuvant treatment And the PET scan shows no evidence of distant metastasis and a para rectal peritoneal nodes less impressive she has had significant response to therapy.  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 APC is negative  #6 patient had a laparotomy with proctocolectomy, J-pouch with ileoanal anastomosis, loop ileostomy on 12/14/2011. The final pathology did not reveal any evidence of residual disease. Patient had a  complicated postop course including high output ileostomy. She required intensive IV fluids on an outpatient basis to prevent dehydration.  #7 patient began adjuvant chemotherapy consisting of XELOX starting 03/05/2012 on a 21 day cycle. A total of 6 cycles are planned.  CURRENT THERAPY: Patient is s/p  cycle #2 of Xeloda and oxaliplatin.  INTERVAL HISTORY: Renee Nicholson 45 y.o. female returns for followup visit today.Today overall she is doing well. However she has desquamation of the skin of her soles of her feet. She otherwise has no other redness no tenderness she has no difficulty in walking..She has been trying to eat more. She denies any nausea vomiting fevers chills headaches shortness of breath chest pains or palpitations no peripheral paresthesias. Remainder of the 10 point review of systems is negative.  MEDICAL HISTORY: Past Medical History  Diagnosis Date  . Anemia   . Diarrhea   . Rectal cancer 08/16/2011  . Colon cancer   . Anxiety   . Bright red rectal bleeding 09/13/2011  . History of chemotherapy     completed 09/2011   . Hx of radiation therapy 08/29/11 to 10/09/11    rectum    ALLERGIES:   has no known allergies.  MEDICATIONS:  Current Outpatient Prescriptions  Medication Sig Dispense Refill  . acetaminophen (TYLENOL) 325 MG tablet Take 2 tablets (650 mg total) by mouth every 4 (four) hours as needed.  200 tablet  1  . dexamethasone (DECADRON) 4 MG tablet Take 2 tablets by mouth daily starting the day after chemotherapy for 2 days. Take with food.  30 tablet  1  . feeding supplement (ENSURE COMPLETE) LIQD Take 237 mLs by  mouth 3 (three) times daily between meals.  50 Bottle  6  . feeding supplement (RESOURCE BREEZE) LIQD Take 1 Container by mouth 3 (three) times daily between meals.  30 Container  6  . Ferrous Sulfate (IRON) 28 MG TABS Take 1 tablet by mouth daily.      . Heparin Lock Flush (HEPARIN FLUSH, PORCINE,) 100 UNIT/ML injection 1 mL (100 Units total) by  Intracatheter route as needed (250 units per lumen).  5 Syringe  3  . HYDROcodone-acetaminophen (VICODIN) 5-500 MG per tablet Take 1-2 tablets by mouth every 6 (six) hours as needed for pain.  30 tablet  0  . lidocaine-prilocaine (EMLA) cream Apply topically as needed.  30 g  6  . loperamide (IMODIUM) 2 MG capsule Take 2 capsules by mouth Every 4 hours as needed.      . megestrol (MEGACE) 400 MG/10ML suspension Take 10 mLs (400 mg total) by mouth 2 (two) times daily.  240 mL  1  . Multiple Vitamin (MULTIVITAMIN) tablet Take 1 tablet by mouth every morning.       . pantoprazole (PROTONIX) 40 MG tablet Take 40 mg by mouth daily.      . polysaccharide iron (NIFEREX) 150 MG CAPS capsule Take 150 mg by mouth every morning.       . prochlorperazine (COMPAZINE) 10 MG tablet Take 1 tablet (10 mg total) by mouth every 6 (six) hours as needed (Nausea or vomiting).  30 tablet  1  . prochlorperazine (COMPAZINE) 25 MG suppository Place 1 suppository (25 mg total) rectally every 12 (twelve) hours as needed for nausea.  12 suppository  3  . simethicone (MYLICON) 80 MG chewable tablet Chew 80 mg by mouth every 6 (six) hours as needed. For gas        SURGICAL HISTORY:  Past Surgical History  Procedure Date  . Tubal ligation   . Carpal tunnel release   . Colonoscopy 07/26/2011    Procedure: COLONOSCOPY;  Surgeon: Arlyce Harman, MD;  Location: [facility];  Service: Endoscopy;  Laterality: N/A;  10:40  . Esophagogastroduodenoscopy 07/26/2011    Procedure: ESOPHAGOGASTRODUODENOSCOPY (EGD);  Surgeon: Arlyce Harman, MD;  Location: [facility];  Service: Endoscopy;  Laterality: N/A;  . Esophagogastroduodenoscopy 12/27/2011    Procedure: ESOPHAGOGASTRODUODENOSCOPY (EGD);  Surgeon: Shirley Friar, MD;  Location: [facility];  Service: Endoscopy;  Laterality: N/A;  . Laparoscopic colon resection 12/14/11    diverting ileostomy  . Portacath placement 02/21/2012    Procedure: INSERTION PORT-A-CATH;  Surgeon:  [name], MD;  Location: [facility];  Service: General;  Laterality: N/A;    REVIEW OF SYSTEMS:  Pertinent items are noted in HPI.   PHYSICAL EXAMINATION: General appearance: alert, cooperative, appears stated age, mild distress and pale Resp: clear to auscultation bilaterally and normal percussion bilaterally Cardio: regular rate and rhythm, S1, S2 normal, no murmur, click, rub or gallop GI: soft, non-tender; bowel sounds normal; no masses,  no organomegaly Extremities: extremities normal, atraumatic, no cyanosis or edema Neurologic: Alert and oriented X 3, normal strength and tone. Normal symmetric reflexes. Normal coordination and gait Rectal Exam: patient does not have any active bleeding, there is noted to be external hemorrhoids.  ECOG PERFORMANCE STATUS: 0 - Asymptomatic  Blood pressure 116/69, pulse 64, temperature 98 F (36.7 C), temperature source Oral, height 4\' 9"  (1.448 m), weight 83 lb 1.6 oz (37.694 kg), last menstrual period 02/20/2012.  LABORATORY DATA: Lab Results  Component Value Date   WBC  3.2* 04/23/2012   HGB 11.0* 04/23/2012   HCT 32.7* 04/23/2012   MCV 90.1 04/23/2012   PLT 157 04/23/2012      Chemistry      Component Value Date/Time   NA 140 04/23/2012 1115   K 3.5 04/23/2012 1115   CL 104 04/23/2012 1115   CO2 27 04/23/2012 1115   BUN 10 04/23/2012 1115   CREATININE 0.70 04/23/2012 1115   CREATININE 0.70 07/27/2011 1306      Component Value Date/Time   CALCIUM 9.0 04/23/2012 1115   ALKPHOS 95 04/23/2012 1115   AST 36 04/23/2012 1115   ALT 39* 04/23/2012 1115   BILITOT 0.7 04/23/2012 1115     ADDITIONAL INFORMATION: 1. Following placement in clearing solution, three more lymph nodes were identified, all of which are benign. Thus, the total lymph node count is 11. (JK:mw 12-18-11) 2. Following an exhaustive search, additional tissue has been submitted in an attempt to recover lymph nodes. Two more lymph nodes were identified, both benign. This brings the total  number of lymph nodes recovered to 13, all of which are benign. (JBK:eps 12/26/11) FINAL DIAGNOSIS Diagnosis 1. Colon, resection margin (donut), distal rectal - COLORECTAL MUCOSA WITH FIBROSIS AND EDEMA, CONSISTENT WITH TREATMENT. - THERE IS NO EVIDENCE OF MALIGNANCY. 2. Colon, total resection (incl lymph nodes) - COLON WITH SCATTERED TUBULAR ADENOMAS. - HIGH GRADE DYSPLASIA IS NOT IDENTIFIED. - BENIGN APPENDIX WITH FIBROUS OBLITERATION OF THE TIP. - THERE IS NO EVIDENCE OF CARCINOMA IN 8 OF 8 LYMPH NODES (0/8). - SEE ONCOLOGY TABLE BELOW. 3. Colon, resection margin (donut), new distal margin and rectum - BENIGN SQUAMOUS LINED MUCOSA. - THERE IS NO EVIDENCE OF MALIGNANCY. Microscopic Comment 2. COLON AND RECTUM Specimen: Colon, appendix, and terminal ileum. Procedure: Total colectomy. Tumor site: N/A Specimen integrity: Intact. Macroscopic intactness of mesorectum: Complete. Macroscopic tumor perforation: N/A Invasive tumor: N/A 1 of 3 FINAL for ANAISABEL, PEDERSON ([id number]) Microscopic Comment(continued) Microscopic extension of invasive tumor: N/A Lymph-Vascular invasion: N/A Peri-neural invasion: N/A Tumor deposit(s) (discontinuous extramural extension): N/A Resection margins: Negative for adenocarcinoma. Treatment effect (neoadjuvant therapy): Present, significant. Number of lymph nodes examined- 8 ; Number positive - 0 (PLEASE NOTE: Additional tissue will be submitted in an attempt to recover more lymph nodes) Additional polyp(s): Multiple scattered tubular adenomas. Pathologic Staging: ypT0, ypN0 Ancillary studies: N/A Comments: Grossly, there is a 1.5 cm granular depressed area of mucosa present at the distal portion of the specimen. This entire area has been submitted for histologic evaluation revealing inflamed, fibrotic, and edematous colorectal mucosa, consistent with treatment effect. Adenocarcinoma is not identified. In addition, there are scattered tubular  adenomas throughout the specimen. No high grade dysplasia is identified. The case was discussed with Dr. Donell Beers on 12/15/2011. (JBK:gt, 12/15/11) Pecola Leisure MD Pathologist, Electronic Signature (Case signed 12/15/2011) Valentino Hue  RADIOGRAPHIC STUDIES:  Nm Pet Image Initial (pi) Skull Base To Thigh 1.  Anorectal primary without evidence of distant metastasis. 2.  Perirectal/peritoneal nodes described on the prior diagnostic CT are less impressive today and may have undergone response to therapy.  Based on location, these were suspicious on the prior. 3.  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 Neoplasm cannot be excluded.  Consider further evaluation with ultrasound. 4.  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  Consider correlation with cardiac risk factors.  5.  Sinus disease.  Original Report Authenticated By: Consuello Bossier, M.D.     ASSESSMENT: 46 year old female with:  #  1. stage III adenocarcinoma of the rectum. Patient is now status post definitive surgery after having received neoadjuvant chemotherapy and radiation therapy. Her final pathology does not reveal any evidence of residual disease. 8 nodes were negative for metastatic disease.  #2 . Patient was begun on adjuvant chemotherapy consisting of Xeloda and tape side of being. She does far has received one cycle. This course was complicated by development of a severe dehydration requiring hospitalization.  #3 patient is now s/p cycle #2 of Oxaliplatin on Friday, 04/12/2012. Unfortunately she developed swelling of her tongue secondary to ingesting cold fluids.She recovered uneventfully.  #4 patient was also on Xeloda but unfortunately she developed severe desquamation of the soles of her feet and this was discontinued  PLAN: #1 Patient will stay off of the Xeloda at least for this week. I will plan on reevaluating next week when she returns for her cycle #3 of  oxaliplatin and Xeloda.  #2 patients continue to exercise eat a healthy and try to maintain her weight.  All questions were answered. The patient knows to call the clinic with any problems, questions or concerns. We can certainly see the patient much sooner if necessary.  I spent 25 minutes counseling the patient face to face. The total time spent in the appointment was 30 minutes.    Drue Second, MD Medical/Oncology [facility] [phone] (beeper) [phone] (Office)  04/23/2012, 5:08 PM

## 2012-04-23 NOTE — Telephone Encounter (Signed)
Message copied by Tylene Fantasia on Tue Apr 23, 2012  4:56 PM ------      Message from: Victorino December      Created: Tue Apr 23, 2012  3:45 PM       Please call patient: x-ray ok

## 2012-04-23 NOTE — Patient Instructions (Addendum)
I will see you back in 1 week. 

## 2012-04-23 NOTE — Telephone Encounter (Signed)
Per MD, x-ray looked ok. Patient expressed understanding, no questions at this time.

## 2012-04-23 NOTE — Telephone Encounter (Signed)
Per orders from 04-23-2012 gave patient instructions on getting her walkin x-ray done in the [facility] long [facility]

## 2012-04-29 ENCOUNTER — Ambulatory Visit: Payer: BC Managed Care – PPO | Admitting: Oncology

## 2012-04-29 ENCOUNTER — Other Ambulatory Visit: Payer: BC Managed Care – PPO | Admitting: Lab

## 2012-04-30 ENCOUNTER — Other Ambulatory Visit: Payer: BC Managed Care – PPO | Admitting: Lab

## 2012-04-30 ENCOUNTER — Ambulatory Visit: Payer: BC Managed Care – PPO

## 2012-04-30 ENCOUNTER — Other Ambulatory Visit (HOSPITAL_BASED_OUTPATIENT_CLINIC_OR_DEPARTMENT_OTHER): Payer: BC Managed Care – PPO | Admitting: Lab

## 2012-04-30 ENCOUNTER — Ambulatory Visit (HOSPITAL_BASED_OUTPATIENT_CLINIC_OR_DEPARTMENT_OTHER): Payer: BC Managed Care – PPO

## 2012-04-30 ENCOUNTER — Ambulatory Visit (HOSPITAL_BASED_OUTPATIENT_CLINIC_OR_DEPARTMENT_OTHER): Payer: BC Managed Care – PPO | Admitting: Oncology

## 2012-04-30 ENCOUNTER — Telehealth: Payer: Self-pay | Admitting: Oncology

## 2012-04-30 ENCOUNTER — Encounter: Payer: Self-pay | Admitting: Oncology

## 2012-04-30 VITALS — BP 130/82 | HR 62

## 2012-04-30 VITALS — BP 106/69 | HR 65 | Temp 97.6°F | Resp 20 | Ht <= 58 in | Wt 83.4 lb

## 2012-04-30 DIAGNOSIS — E860 Dehydration: Secondary | ICD-10-CM

## 2012-04-30 DIAGNOSIS — C20 Malignant neoplasm of rectum: Secondary | ICD-10-CM

## 2012-04-30 DIAGNOSIS — Z5111 Encounter for antineoplastic chemotherapy: Secondary | ICD-10-CM

## 2012-04-30 DIAGNOSIS — E079 Disorder of thyroid, unspecified: Secondary | ICD-10-CM

## 2012-04-30 LAB — CBC WITH DIFFERENTIAL/PLATELET
BASO%: 0.4 % (ref 0.0–2.0)
EOS%: 4.4 % (ref 0.0–7.0)
MCH: 30.3 pg (ref 25.1–34.0)
MCHC: 35.2 g/dL (ref 31.5–36.0)
RBC: 3.66 10*6/uL — ABNORMAL LOW (ref 3.70–5.45)
RDW: 15.1 % — ABNORMAL HIGH (ref 11.2–14.5)
lymph#: 0.9 10*3/uL (ref 0.9–3.3)

## 2012-04-30 MED ORDER — SODIUM CHLORIDE 0.9 % IV SOLN: Freq: Once | INTRAVENOUS | Status: DC

## 2012-04-30 MED ORDER — LORAZEPAM 2 MG/ML IJ SOLN: 0.5000 mg | Freq: Once | INTRAMUSCULAR | Status: DC

## 2012-04-30 MED ORDER — OXALIPLATIN CHEMO INJECTION 100 MG/20ML
130.0000 mg/m2 | Freq: Once | INTRAVENOUS | Status: DC
  Filled 2012-04-30: qty 34

## 2012-04-30 MED ORDER — ONDANSETRON 8 MG/50ML IVPB (CHCC)
8.0000 mg | Freq: Once | INTRAVENOUS | Status: AC
  Administered 2012-04-30: 8 mg via INTRAVENOUS

## 2012-04-30 MED ORDER — DEXTROSE 5 % IV SOLN
Freq: Once | INTRAVENOUS | Status: AC
  Administered 2012-04-30: 13:00:00 via INTRAVENOUS

## 2012-04-30 MED ORDER — SODIUM CHLORIDE 0.9 % IJ SOLN
10.0000 mL | INTRAMUSCULAR | Status: DC | PRN
  Administered 2012-04-30: 10 mL
  Filled 2012-04-30: qty 10

## 2012-04-30 MED ORDER — DIPHENHYDRAMINE HCL 50 MG/ML IJ SOLN
25.0000 mg | Freq: Once | INTRAMUSCULAR | Status: AC
  Administered 2012-04-30: 25 mg via INTRAVENOUS

## 2012-04-30 MED ORDER — HEPARIN SOD (PORK) LOCK FLUSH 100 UNIT/ML IV SOLN
500.0000 [IU] | Freq: Once | INTRAVENOUS | Status: AC | PRN
  Administered 2012-04-30: 500 [IU]
  Filled 2012-04-30: qty 5

## 2012-04-30 MED ORDER — SODIUM CHLORIDE 0.9 % IV SOLN
1000.0000 mL | INTRAVENOUS | Status: DC
  Administered 2012-04-30: 500 mL via INTRAVENOUS

## 2012-04-30 MED ORDER — DEXAMETHASONE SODIUM PHOSPHATE 10 MG/ML IJ SOLN
10.0000 mg | Freq: Once | INTRAMUSCULAR | Status: AC
  Administered 2012-04-30: 10 mg via INTRAVENOUS

## 2012-04-30 NOTE — Progress Notes (Signed)
Patient without complaints of tingling in throat, denies headache.  Only c/o is numbness to hands and feet which has been ongoing.  Patient very drowsy.

## 2012-04-30 NOTE — Progress Notes (Signed)
Ok to treat with Abs Neutro of 1.3 per Dr. Welton Flakes.

## 2012-04-30 NOTE — Patient Instructions (Addendum)
Proceed with chemotherapy today. Hold Xeloda for now  Call if you have fevers or chills  Stay away from the cold!!

## 2012-04-30 NOTE — Progress Notes (Signed)
OFFICE PROGRESS NOTE  CC: [name], MD [name], MD [name], MD [name], MD [address] [place] [address] [name], MD  DIAGNOSIS: 46 year old female with new diagnosis of rectal carcinoma by rectal ultrasound she was found to have a T3 N1 lesion.  PRIOR THERAPY:   #1 patient presented with a several month history of diarrhea and then subsequent bleeding. She was found to be anemic with a hemoglobin of 10. Because of this she went on to have a colonoscopy performed. The colonoscopy showed multiple sessile polyps in the cecum ascending transverse and sigmoid colon as well as the rectum. The rectal area showed a large exophytic rectal mass approximately 1 cm above the dentate line measuring 3-4 cm. She had a biopsy of this performed and was found to be an adenocarcinoma consistent with a rectal primary.  #2 patient was seen by Dr. [name] at [facility] who performed a rectal ultrasound. The staging clinically was T3 N1. It was recommended by Dr. [name] the patient undergo neoadjuvant chemotherapy and radiation higher to her definitive surgery.  #3 Patient has completed concurrent radiation and chemotherapy. Her chemotherapy consisted of single agent xeloda administrated 08/22/11 - 10/09/2011. Overall she tolerated her treatment very well  #4 Has completed all of neoadjuvant treatment And the PET scan shows no evidence of distant metastasis and a para rectal peritoneal nodes less impressive she has had significant response to therapy.  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 APC is negative  #6 patient had a laparotomy with proctocolectomy, J-pouch with ileoanal anastomosis, loop ileostomy on 12/14/2011. The final pathology did not reveal any evidence of residual disease. Patient had a  complicated postop course including high output ileostomy. She required intensive IV fluids on an outpatient basis to prevent dehydration.  #7 patient began adjuvant chemotherapy consisting of XELOX starting 03/05/2012 on a 21 day cycle. A total of 6 cycles are planned.  CURRENT THERAPY: Here for cycle 3 of oxaliplatin. We will hold the Xeloda for now.  INTERVAL HISTORY: Renee Nicholson 46 y.o. female returns for followup visit today.Today overall she is doing well.  She  has no other redness no tenderness she has no difficulty in walking..She has been trying to eat more. She denies any nausea vomiting fevers chills headaches shortness of breath chest pains or palpitations no peripheral paresthesias. Remainder of the 10 point review of systems is negative.  MEDICAL HISTORY: Past Medical History  Diagnosis Date  . Anemia   . Diarrhea   . Rectal cancer 08/16/2011  . Colon cancer   . Anxiety   . Bright red rectal bleeding 09/13/2011  . History of chemotherapy     completed 09/2011   . Hx of radiation therapy 08/29/11 to 10/09/11    rectum    ALLERGIES:   has no known allergies.  MEDICATIONS:  Current Outpatient Prescriptions  Medication Sig Dispense Refill  . acetaminophen (TYLENOL) 325 MG tablet Take 2 tablets (650 mg total) by mouth every 4 (four) hours as needed.  200 tablet  1  . dexamethasone (DECADRON) 4 MG tablet Take 2 tablets by mouth daily starting the day after chemotherapy for 2 days. Take with food.  30 tablet  1  . feeding supplement (ENSURE COMPLETE) LIQD Take 237 mLs by mouth 3 (three) times daily between meals.  50  Bottle  6  . feeding supplement (RESOURCE BREEZE) LIQD Take 1 Container by mouth 3 (three) times daily between meals.  30 Container  6  . Ferrous Sulfate (IRON) 28 MG TABS Take 1 tablet by mouth daily.      . Heparin Lock Flush (HEPARIN FLUSH, PORCINE,) 100 UNIT/ML injection 1 mL (100 Units total) by Intracatheter route as needed (250 units per lumen).  5 Syringe   3  . HYDROcodone-acetaminophen (VICODIN) 5-500 MG per tablet Take 1-2 tablets by mouth every 6 (six) hours as needed for pain.  30 tablet  0  . lidocaine-prilocaine (EMLA) cream Apply topically as needed.  30 g  6  . loperamide (IMODIUM) 2 MG capsule Take 2 capsules by mouth Every 4 hours as needed.      . megestrol (MEGACE) 400 MG/10ML suspension Take 10 mLs (400 mg total) by mouth 2 (two) times daily.  240 mL  1  . Multiple Vitamin (MULTIVITAMIN) tablet Take 1 tablet by mouth every morning.       . pantoprazole (PROTONIX) 40 MG tablet Take 40 mg by mouth daily.      . polysaccharide iron (NIFEREX) 150 MG CAPS capsule Take 150 mg by mouth every morning.       . prochlorperazine (COMPAZINE) 10 MG tablet Take 1 tablet (10 mg total) by mouth every 6 (six) hours as needed (Nausea or vomiting).  30 tablet  1  . prochlorperazine (COMPAZINE) 25 MG suppository Place 1 suppository (25 mg total) rectally every 12 (twelve) hours as needed for nausea.  12 suppository  3  . simethicone (MYLICON) 80 MG chewable tablet Chew 80 mg by mouth every 6 (six) hours as needed. For gas        SURGICAL HISTORY:  Past Surgical History  Procedure Date  . Tubal ligation   . Carpal tunnel release   . Colonoscopy 07/26/2011    Procedure: COLONOSCOPY;  Surgeon: Arlyce Harman, MD;  Location: [facility];  Service: Endoscopy;  Laterality: N/A;  10:40  . Esophagogastroduodenoscopy 07/26/2011    Procedure: ESOPHAGOGASTRODUODENOSCOPY (EGD);  Surgeon: Arlyce Harman, MD;  Location: [facility];  Service: Endoscopy;  Laterality: N/A;  . Esophagogastroduodenoscopy 12/27/2011    Procedure: ESOPHAGOGASTRODUODENOSCOPY (EGD);  Surgeon: Shirley Friar, MD;  Location: [facility];  Service: Endoscopy;  Laterality: N/A;  . Laparoscopic colon resection 12/14/11    diverting ileostomy  . Portacath placement 02/21/2012    Procedure: INSERTION PORT-A-CATH;  Surgeon: [name], MD;  Location: [facility];  Service: General;   Laterality: N/A;    REVIEW OF SYSTEMS:  Pertinent items are noted in HPI.   PHYSICAL EXAMINATION: General appearance: alert, cooperative, appears stated age, mild distress and pale Resp: clear to auscultation bilaterally and normal percussion bilaterally Cardio: regular rate and rhythm, S1, S2 normal, no murmur, click, rub or gallop GI: soft, non-tender; bowel sounds normal; no masses,  no organomegaly Extremities: extremities normal, atraumatic, no cyanosis or edema Neurologic: Alert and oriented X 3, normal strength and tone. Normal symmetric reflexes. Normal coordination and gait Rectal Exam: patient does not have any active bleeding, there is noted to be external hemorrhoids.  ECOG PERFORMANCE STATUS: 0 - Asymptomatic  Blood pressure 106/69, pulse 65, temperature 97.6 F (36.4 C), resp. rate 20, height 4\' 9"  (1.448 m), weight 83 lb 6.4 oz (37.83 kg), last menstrual period 02/20/2012.  LABORATORY DATA: Lab Results  Component Value Date   WBC 2.8* 04/30/2012   HGB 11.1* 04/30/2012  HCT 31.5* 04/30/2012   MCV 86.1 04/30/2012   PLT 149 04/30/2012      Chemistry      Component Value Date/Time   NA 140 04/23/2012 1115   K 3.5 04/23/2012 1115   CL 104 04/23/2012 1115   CO2 27 04/23/2012 1115   BUN 10 04/23/2012 1115   CREATININE 0.70 04/23/2012 1115   CREATININE 0.70 07/27/2011 1306      Component Value Date/Time   CALCIUM 9.0 04/23/2012 1115   ALKPHOS 95 04/23/2012 1115   AST 36 04/23/2012 1115   ALT 39* 04/23/2012 1115   BILITOT 0.7 04/23/2012 1115     ADDITIONAL INFORMATION: 1. Following placement in clearing solution, three more lymph nodes were identified, all of which are benign. Thus, the total lymph node count is 11. (JK:mw 12-18-11) 2. Following an exhaustive search, additional tissue has been submitted in an attempt to recover lymph nodes. Two more lymph nodes were identified, both benign. This brings the total number of lymph nodes recovered to 13, all of which are benign.  (JBK:eps 12/26/11) FINAL DIAGNOSIS Diagnosis 1. Colon, resection margin (donut), distal rectal - COLORECTAL MUCOSA WITH FIBROSIS AND EDEMA, CONSISTENT WITH TREATMENT. - THERE IS NO EVIDENCE OF MALIGNANCY. 2. Colon, total resection (incl lymph nodes) - COLON WITH SCATTERED TUBULAR ADENOMAS. - HIGH GRADE DYSPLASIA IS NOT IDENTIFIED. - BENIGN APPENDIX WITH FIBROUS OBLITERATION OF THE TIP. - THERE IS NO EVIDENCE OF CARCINOMA IN 8 OF 8 LYMPH NODES (0/8). - SEE ONCOLOGY TABLE BELOW. 3. Colon, resection margin (donut), new distal margin and rectum - BENIGN SQUAMOUS LINED MUCOSA. - THERE IS NO EVIDENCE OF MALIGNANCY. Microscopic Comment 2. COLON AND RECTUM Specimen: Colon, appendix, and terminal ileum. Procedure: Total colectomy. Tumor site: N/A Specimen integrity: Intact. Macroscopic intactness of mesorectum: Complete. Macroscopic tumor perforation: N/A Invasive tumor: N/A 1 of 3 FINAL for GLORIOUS, FLICKER ([id number]) Microscopic Comment(continued) Microscopic extension of invasive tumor: N/A Lymph-Vascular invasion: N/A Peri-neural invasion: N/A Tumor deposit(s) (discontinuous extramural extension): N/A Resection margins: Negative for adenocarcinoma. Treatment effect (neoadjuvant therapy): Present, significant. Number of lymph nodes examined- 8 ; Number positive - 0 (PLEASE NOTE: Additional tissue will be submitted in an attempt to recover more lymph nodes) Additional polyp(s): Multiple scattered tubular adenomas. Pathologic Staging: ypT0, ypN0 Ancillary studies: N/A Comments: Grossly, there is a 1.5 cm granular depressed area of mucosa present at the distal portion of the specimen. This entire area has been submitted for histologic evaluation revealing inflamed, fibrotic, and edematous colorectal mucosa, consistent with treatment effect. Adenocarcinoma is not identified. In addition, there are scattered tubular adenomas throughout the specimen. No high grade dysplasia  is identified. The case was discussed with Dr. Donell Beers on 12/15/2011. (JBK:gt, 12/15/11) Pecola Leisure MD Pathologist, Electronic Signature (Case signed 12/15/2011) Valentino Hue  RADIOGRAPHIC STUDIES:  Nm Pet Image Initial (pi) Skull Base To Thigh 1.  Anorectal primary without evidence of distant metastasis. 2.  Perirectal/peritoneal nodes described on the prior diagnostic CT are less impressive today and may have undergone response to therapy.  Based on location, these were suspicious on the prior. 3.  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 Neoplasm cannot be excluded.  Consider further evaluation with ultrasound. 4.  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  Consider correlation with cardiac risk factors.  5.  Sinus disease.  Original Report Authenticated By: Consuello Bossier, M.D.     ASSESSMENT: 46 year old female with:  #1. stage III adenocarcinoma of the rectum. Patient  is now status post definitive surgery after having received neoadjuvant chemotherapy and radiation therapy. Her final pathology does not reveal any evidence of residual disease. 8 nodes were negative for metastatic disease.  #2 . Patient was begun on adjuvant chemotherapy consisting of Xeloda and tape side of being. She does far has received one cycle. This course was complicated by development of a severe dehydration requiring hospitalization.  #3 proceed with  cycle #3 of Oxaliplatin    PLAN: #1 Patient will proceed with oxaliplatin only today. We will hold so low.for low counts.  #2 patient is counseled to avoid all hold foods including opening Refrigerators she is counseled to wear warm clothing as well as gloves.  #3 patient is counseled regarding neutropenic precautions.  #4 I will plan on seeing her back in one week's time.  All questions were answered. The patient knows to call the clinic with any problems, questions or concerns. We can  certainly see the patient much sooner if necessary.  I spent 25 minutes counseling the patient face to face. The total time spent in the appointment was 30 minutes.    Drue Second, MD Medical/Oncology [facility] [phone] (beeper) [phone] (Office)  04/30/2012, 12:04 PM

## 2012-04-30 NOTE — Patient Instructions (Addendum)
[facility] Discharge Instructions for Patients Receiving Chemotherapy  Today you received the following chemotherapy agents: oxaliplatin  To help prevent nausea and vomiting after your treatment, we encourage you to take your nausea medication.  Take it as often as prescribed.     If you develop nausea and vomiting that is not controlled by your nausea medication, call the clinic. If it is after clinic hours your family physician or the after hours number for the clinic or go to the Emergency Department.   BELOW ARE SYMPTOMS THAT SHOULD BE REPORTED IMMEDIATELY:  *FEVER GREATER THAN 100.5 F  *CHILLS WITH OR WITHOUT FEVER  NAUSEA AND VOMITING THAT IS NOT CONTROLLED WITH YOUR NAUSEA MEDICATION  *UNUSUAL SHORTNESS OF BREATH  *UNUSUAL BRUISING OR BLEEDING  TENDERNESS IN MOUTH AND THROAT WITH OR WITHOUT PRESENCE OF ULCERS  *URINARY PROBLEMS  *BOWEL PROBLEMS  UNUSUAL RASH Items with * indicate a potential emergency and should be followed up as soon as possible.  Feel free to call the clinic you have any questions or concerns. The clinic phone number is [phone].   I have been informed and understand all the instructions given to me. I know to contact the clinic, my physician, or go to the Emergency Department if any problems should occur. I do not have any questions at this time, but understand that I may call the clinic during office hours   should I have any questions or need assistance in obtaining follow up care.    __________________________________________  _____________  __________ Signature of Patient or Authorized Representative            Date                   Time    __________________________________________ Nurse's Signature    

## 2012-04-30 NOTE — Telephone Encounter (Signed)
gve the pt's dtr the aug 2013 appt calendar

## 2012-04-30 NOTE — Progress Notes (Signed)
Pt reports tingling in her throat and headache at front of her head.  Oxaliplatin stopped (1 hour 5 minutes left on infusion), D5 running at 144 mL/hr, VS 131/76, HR 73, Temp 98.0.  MD - Welton Flakes paged.  Per Dr. Clelia Croft, on-call MD, stop Oxaliplatin, run D5 and have patient remain for an extra 30 minutes.  Patient requested IV Benadryl be given, Dr. Clelia Croft notified, orders received.

## 2012-05-01 ENCOUNTER — Ambulatory Visit (HOSPITAL_BASED_OUTPATIENT_CLINIC_OR_DEPARTMENT_OTHER): Payer: BC Managed Care – PPO

## 2012-05-01 ENCOUNTER — Ambulatory Visit: Payer: BC Managed Care – PPO

## 2012-05-01 ENCOUNTER — Encounter: Payer: Self-pay | Admitting: *Deleted

## 2012-05-01 ENCOUNTER — Other Ambulatory Visit: Payer: Self-pay | Admitting: *Deleted

## 2012-05-01 VITALS — BP 130/80 | HR 73 | Temp 97.4°F

## 2012-05-01 DIAGNOSIS — C20 Malignant neoplasm of rectum: Secondary | ICD-10-CM

## 2012-05-01 DIAGNOSIS — Z5111 Encounter for antineoplastic chemotherapy: Secondary | ICD-10-CM

## 2012-05-01 LAB — COMPREHENSIVE METABOLIC PANEL
AST: 42 U/L — ABNORMAL HIGH (ref 0–37)
Albumin: 3.6 g/dL (ref 3.5–5.2)
Alkaline Phosphatase: 76 U/L (ref 39–117)
BUN: 9 mg/dL (ref 6–23)
Potassium: 3.3 mEq/L — ABNORMAL LOW (ref 3.5–5.3)
Sodium: 140 mEq/L (ref 135–145)
Total Bilirubin: 0.7 mg/dL (ref 0.3–1.2)
Total Protein: 5.7 g/dL — ABNORMAL LOW (ref 6.0–8.3)

## 2012-05-01 MED ORDER — HEPARIN SOD (PORK) LOCK FLUSH 100 UNIT/ML IV SOLN
500.0000 [IU] | Freq: Once | INTRAVENOUS | Status: AC
  Administered 2012-05-01: 500 [IU] via INTRAVENOUS
  Filled 2012-05-01: qty 5

## 2012-05-01 MED ORDER — LORAZEPAM 2 MG/ML IJ SOLN
0.5000 mg | Freq: Once | INTRAMUSCULAR | Status: AC
  Administered 2012-05-01: 0.5 mg via INTRAVENOUS

## 2012-05-01 MED ORDER — ONDANSETRON 8 MG/50ML IVPB (CHCC)
8.0000 mg | Freq: Once | INTRAVENOUS | Status: AC
  Administered 2012-05-01: 8 mg via INTRAVENOUS

## 2012-05-01 MED ORDER — SODIUM CHLORIDE 0.9 % IJ SOLN
10.0000 mL | INTRAMUSCULAR | Status: DC | PRN
  Administered 2012-05-01: 10 mL via INTRAVENOUS
  Filled 2012-05-01: qty 10

## 2012-05-01 MED ORDER — SODIUM CHLORIDE 0.9 % IV SOLN
INTRAVENOUS | Status: AC
  Administered 2012-05-01: 09:00:00 via INTRAVENOUS

## 2012-05-01 NOTE — Patient Instructions (Addendum)
[facility]   Today you received : IV fluids and Zofran & Ativan  To help prevent nausea and vomiting after your treatment, we encourage you to take your nausea medication as ordered:  Decadron 8 mg by mouth twice daily x 2 days  Compazine 10 mg by mouth every 6 hours as needed   Compazine 25 mg suppository every 12 hours as needed if you can't keep tablet down   If you develop nausea and vomiting that is not controlled by your nausea medication, call the clinic. If it is after clinic hours your family physician or the after hours number for the clinic or go to the Emergency Department.  Use OTC saline nasal spray or vasoline in nasal passages to keep moist.  BELOW ARE SYMPTOMS THAT SHOULD BE REPORTED IMMEDIATELY:  *FEVER GREATER THAN 100.5 F  *CHILLS WITH OR WITHOUT FEVER  NAUSEA AND VOMITING THAT IS NOT CONTROLLED WITH YOUR NAUSEA MEDICATION  *UNUSUAL SHORTNESS OF BREATH  *UNUSUAL BRUISING OR BLEEDING  TENDERNESS IN MOUTH AND THROAT WITH OR WITHOUT PRESENCE OF ULCERS  *URINARY PROBLEMS  *BOWEL PROBLEMS  UNUSUAL RASH Items with * indicate a potential emergency and should be followed up as soon as possible.   Feel free to call the clinic you have any questions or concerns. The clinic phone number is [phone].  *Return on 05/02/12 for more IV fluids   I have been informed and understand all the instructions given to me. I know to contact the clinic, my physician, or go to the Emergency Department if any problems should occur. I do not have any questions at this time, but understand that I may call the clinic during office hours   should I have any questions or need assistance in obtaining follow up care.    __________________________________________  _____________  __________ Signature of Patient or Authorized Representative            Date                   Time    __________________________________________ Nurse's Signature

## 2012-05-02 ENCOUNTER — Ambulatory Visit (HOSPITAL_BASED_OUTPATIENT_CLINIC_OR_DEPARTMENT_OTHER): Payer: BC Managed Care – PPO

## 2012-05-02 ENCOUNTER — Other Ambulatory Visit: Payer: Self-pay | Admitting: *Deleted

## 2012-05-02 VITALS — BP 117/77 | HR 71 | Temp 98.1°F

## 2012-05-02 DIAGNOSIS — C20 Malignant neoplasm of rectum: Secondary | ICD-10-CM

## 2012-05-02 DIAGNOSIS — E860 Dehydration: Secondary | ICD-10-CM

## 2012-05-02 MED ORDER — ONDANSETRON 8 MG/50ML IVPB (CHCC)
8.0000 mg | Freq: Once | INTRAVENOUS | Status: AC
  Administered 2012-05-02: 8 mg via INTRAVENOUS

## 2012-05-02 MED ORDER — ACETAMINOPHEN 325 MG PO TABS
650.0000 mg | ORAL_TABLET | Freq: Once | ORAL | Status: AC
  Administered 2012-05-02: 650 mg via ORAL

## 2012-05-02 MED ORDER — SODIUM CHLORIDE 0.9 % IV SOLN
INTRAVENOUS | Status: DC
  Administered 2012-05-02: 10:00:00 via INTRAVENOUS

## 2012-05-02 MED ORDER — LORAZEPAM 2 MG/ML IJ SOLN: 0.5000 mg | Freq: Once | INTRAMUSCULAR | Status: DC

## 2012-05-06 ENCOUNTER — Other Ambulatory Visit (HOSPITAL_BASED_OUTPATIENT_CLINIC_OR_DEPARTMENT_OTHER): Payer: BC Managed Care – PPO

## 2012-05-06 ENCOUNTER — Ambulatory Visit (HOSPITAL_BASED_OUTPATIENT_CLINIC_OR_DEPARTMENT_OTHER): Payer: BC Managed Care – PPO | Admitting: Oncology

## 2012-05-06 ENCOUNTER — Telehealth: Payer: Self-pay | Admitting: *Deleted

## 2012-05-06 VITALS — BP 123/81 | HR 75 | Temp 97.8°F | Resp 20 | Ht <= 58 in | Wt 82.8 lb

## 2012-05-06 DIAGNOSIS — C20 Malignant neoplasm of rectum: Secondary | ICD-10-CM

## 2012-05-06 LAB — COMPREHENSIVE METABOLIC PANEL
AST: 55 U/L — ABNORMAL HIGH (ref 0–37)
Albumin: 4.1 g/dL (ref 3.5–5.2)
Alkaline Phosphatase: 81 U/L (ref 39–117)
BUN: 8 mg/dL (ref 6–23)
Creatinine, Ser: 0.7 mg/dL (ref 0.50–1.10)
Glucose, Bld: 102 mg/dL — ABNORMAL HIGH (ref 70–99)
Potassium: 3.3 mEq/L — ABNORMAL LOW (ref 3.5–5.3)
Total Bilirubin: 0.7 mg/dL (ref 0.3–1.2)

## 2012-05-06 LAB — CBC WITH DIFFERENTIAL/PLATELET
Basophils Absolute: 0 10*3/uL (ref 0.0–0.1)
EOS%: 7.3 % — ABNORMAL HIGH (ref 0.0–7.0)
HGB: 11.8 g/dL (ref 11.6–15.9)
LYMPH%: 24.1 % (ref 14.0–49.7)
MCH: 30.5 pg (ref 25.1–34.0)
MCV: 89.7 fL (ref 79.5–101.0)
MONO%: 16.7 % — ABNORMAL HIGH (ref 0.0–14.0)
Platelets: 76 10*3/uL — ABNORMAL LOW (ref 145–400)
RDW: 15.1 % — ABNORMAL HIGH (ref 11.2–14.5)

## 2012-05-06 NOTE — Patient Instructions (Addendum)
We will change your chemotherapy to 5FU an Irinotecan   Fluorouracil, 5-FU injection What is this medicine? FLUOROURACIL, 5-FU (flure oh YOOR a sil) is a chemotherapy drug. It slows the growth of cancer cells. This medicine is used to treat many types of cancer like breast cancer, colon or rectal cancer, pancreatic cancer, and stomach cancer. This medicine may be used for other purposes; ask your health care provider or pharmacist if you have questions. What should I tell my health care provider before I take this medicine? They need to know if you have any of these conditions: -blood disorders -dihydropyrimidine dehydrogenase (DPD) deficiency -infection (especially a virus infection such as chickenpox, cold sores, or herpes) -kidney disease -liver disease -malnourished, poor nutrition -recent or ongoing radiation therapy -an unusual or allergic reaction to fluorouracil, other chemotherapy, other medicines, foods, dyes, or preservatives -pregnant or trying to get pregnant -breast-feeding How should I use this medicine? This drug is given as an infusion or injection into a vein. It is administered in a hospital or clinic by a specially trained health care professional. Talk to your pediatrician regarding the use of this medicine in children. Special care may be needed. Overdosage: If you think you have taken too much of this medicine contact a poison control center or emergency room at once. NOTE: This medicine is only for you. Do not share this medicine with others. What if I miss a dose? It is important not to miss your dose. Call your doctor or health care professional if you are unable to keep an appointment. What may interact with this medicine? -allopurinol -cimetidine -dapsone -digoxin -hydroxyurea -leucovorin -levamisole -medicines for seizures like ethotoin, fosphenytoin, phenytoin -medicines to increase blood counts like filgrastim, pegfilgrastim, sargramostim -medicines  that treat or prevent blood clots like warfarin, enoxaparin, and dalteparin -methotrexate -metronidazole -pyrimethamine -some other chemotherapy drugs like busulfan, cisplatin, estramustine, vinblastine -trimethoprim -trimetrexate -vaccines Talk to your doctor or health care professional before taking any of these medicines: -acetaminophen -aspirin -ibuprofen -ketoprofen -naproxen This list may not describe all possible interactions. Give your health care provider a list of all the medicines, herbs, non-prescription drugs, or dietary supplements you use. Also tell them if you smoke, drink alcohol, or use illegal drugs. Some items may interact with your medicine. What should I watch for while using this medicine? Visit your doctor for checks on your progress. This drug may make you feel generally unwell. This is not uncommon, as chemotherapy can affect healthy cells as well as cancer cells. Report any side effects. Continue your course of treatment even though you feel ill unless your doctor tells you to stop. In some cases, you may be given additional medicines to help with side effects. Follow all directions for their use. Call your doctor or health care professional for advice if you get a fever, chills or sore throat, or other symptoms of a cold or flu. Do not treat yourself. This drug decreases your body's ability to fight infections. Try to avoid being around people who are sick. This medicine may increase your risk to bruise or bleed. Call your doctor or health care professional if you notice any unusual bleeding. Be careful brushing and flossing your teeth or using a toothpick because you may get an infection or bleed more easily. If you have any dental work done, tell your dentist you are receiving this medicine. Avoid taking products that contain aspirin, acetaminophen, ibuprofen, naproxen, or ketoprofen unless instructed by your doctor. These medicines may hide a fever.  Do not become  pregnant while taking this medicine. Women should inform their doctor if they wish to become pregnant or think they might be pregnant. There is a potential for serious side effects to an unborn child. Talk to your health care professional or pharmacist for more information. Do not breast-feed an infant while taking this medicine. Men should inform their doctor if they wish to father a child. This medicine may lower sperm counts. Do not treat diarrhea with over the counter products. Contact your doctor if you have diarrhea that lasts more than 2 days or if it is severe and watery. This medicine can make you more sensitive to the sun. Keep out of the sun. If you cannot avoid being in the sun, wear protective clothing and use sunscreen. Do not use sun lamps or tanning beds/booths. What side effects may I notice from receiving this medicine? Side effects that you should report to your doctor or health care professional as soon as possible: -allergic reactions like skin rash, itching or hives, swelling of the face, lips, or tongue -low blood counts - this medicine may decrease the number of white blood cells, red blood cells and platelets. You may be at increased risk for infections and bleeding. -signs of infection - fever or chills, cough, sore throat, pain or difficulty passing urine -signs of decreased platelets or bleeding - bruising, pinpoint red spots on the skin, black, tarry stools, blood in the urine -signs of decreased red blood cells - unusually weak or tired, fainting spells, lightheadedness -breathing problems -changes in vision -chest pain -mouth sores -nausea and vomiting -pain, swelling, redness at site where injected -pain, tingling, numbness in the hands or feet -redness, swelling, or sores on hands or feet -stomach pain -unusual bleeding Side effects that usually do not require medical attention (report to your doctor or health care professional if they continue or are  bothersome): -changes in finger or toe nails -diarrhea -dry or itchy skin -hair loss -headache -loss of appetite -sensitivity of eyes to the light -stomach upset -unusually teary eyes This list may not describe all possible side effects. Call your doctor for medical advice about side effects. You may report side effects to FDA at 1-800-FDA-1088. Where should I keep my medicine? This drug is given in a hospital or clinic and will not be stored at home. NOTE: This sheet is a summary. It may not cover all possible information. If you have questions about this medicine, talk to your doctor, pharmacist, or health care provider.  2012, [employer]. (01/15/2008 1:53:16 PM)   Irinotecan injection What is this medicine? IRINOTECAN (ir in oh TEE kan ) is a chemotherapy drug. It is used to treat colon and rectal cancer. This medicine may be used for other purposes; ask your health care provider or pharmacist if you have questions. What should I tell my health care provider before I take this medicine? They need to know if you have any of these conditions: -blood disorders -dehydration -diarrhea -infection (especially a virus infection such as chickenpox, cold sores, or herpes) -liver disease -low blood counts, like low white cell, platelet, or red cell counts -recent or ongoing radiation therapy -an unusual or allergic reaction to irinotecan, sorbitol, other chemotherapy, other medicines, foods, dyes, or preservatives -pregnant or trying to get pregnant -breast-feeding How should I use this medicine? This drug is given as an infusion into a vein. It is administered in a hospital or clinic by a specially trained health care professional. Talk  to your pediatrician regarding the use of this medicine in children. Special care may be needed. Overdosage: If you think you have taken too much of this medicine contact a poison control center or emergency room at once. NOTE: This medicine  is only for you. Do not share this medicine with others. What if I miss a dose? It is important not to miss your dose. Call your doctor or health care professional if you are unable to keep an appointment. What may interact with this medicine? Do not take this medicine with any of the following medications: -atazanavir -ketoconazole -St. John's Wort This medicine may also interact with the following medications: -dexamethasone -diuretics -laxatives -medicines for seizures like carbamazepine, mephobarbital, phenobarbital, phenytoin, primidone -medicines to increase blood counts like filgrastim, pegfilgrastim, sargramostim -prochlorperazine -vaccines This list may not describe all possible interactions. Give your health care provider a list of all the medicines, herbs, non-prescription drugs, or dietary supplements you use. Also tell them if you smoke, drink alcohol, or use illegal drugs. Some items may interact with your medicine. What should I watch for while using this medicine? Your condition will be monitored carefully while you are receiving this medicine. You will need important blood work done while you are taking this medicine. This drug may make you feel generally unwell. This is not uncommon, as chemotherapy can affect healthy cells as well as cancer cells. Report any side effects. Continue your course of treatment even though you feel ill unless your doctor tells you to stop. In some cases, you may be given additional medicines to help with side effects. Follow all directions for their use. You may get drowsy or dizzy. Do not drive, use machinery, or do anything that needs mental alertness until you know how this medicine affects you. Do not stand or sit up quickly, especially if you are an older patient. This reduces the risk of dizzy or fainting spells. Call your doctor or health care professional for advice if you get a fever, chills or sore throat, or other symptoms of a cold or  flu. Do not treat yourself. This drug decreases your body's ability to fight infections. Try to avoid being around people who are sick. This medicine may increase your risk to bruise or bleed. Call your doctor or health care professional if you notice any unusual bleeding. Be careful brushing and flossing your teeth or using a toothpick because you may get an infection or bleed more easily. If you have any dental work done, tell your dentist you are receiving this medicine. Avoid taking products that contain aspirin, acetaminophen, ibuprofen, naproxen, or ketoprofen unless instructed by your doctor. These medicines may hide a fever. Do not become pregnant while taking this medicine. Women should inform their doctor if they wish to become pregnant or think they might be pregnant. There is a potential for serious side effects to an unborn child. Talk to your health care professional or pharmacist for more information. Do not breast-feed an infant while taking this medicine. What side effects may I notice from receiving this medicine? Side effects that you should report to your doctor or health care professional as soon as possible: -allergic reactions like skin rash, itching or hives, swelling of the face, lips, or tongue -low blood counts - this medicine may decrease the number of white blood cells, red blood cells and platelets. You may be at increased risk for infections and bleeding. -signs of infection - fever or chills, cough, sore throat, pain or difficulty  passing urine -signs of decreased platelets or bleeding - bruising, pinpoint red spots on the skin, black, tarry stools, blood in the urine -signs of decreased red blood cells - unusually weak or tired, fainting spells, lightheadedness -breathing problems -chest pain -diarrhea -feeling faint or lightheaded, falls -flushing, runny nose, sweating during infusion -mouth sores or pain -pain, swelling, redness or irritation where  injected -pain, swelling, warmth in the leg -pain, tingling, numbness in the hands or feet -problems with balance, talking, walking -stomach cramps, pain -trouble passing urine or change in the amount of urine -vomiting as to be unable to hold down drinks or food -yellowing of the eyes or skin Side effects that usually do not require medical attention (report to your doctor or health care professional if they continue or are bothersome): -constipation -hair loss -headache -loss of appetite -nausea, vomiting -stomach upset This list may not describe all possible side effects. Call your doctor for medical advice about side effects. You may report side effects to FDA at 1-800-FDA-1088. Where should I keep my medicine? This drug is given in a hospital or clinic and will not be stored at home. NOTE: This sheet is a summary. It may not cover all possible information. If you have questions about this medicine, talk to your doctor, pharmacist, or health care provider.  2012, [employer]. (01/28/2008 4:29:12 PM)

## 2012-05-06 NOTE — Telephone Encounter (Signed)
Per staff message and POF I have scheduled appts.  JMW  

## 2012-05-06 NOTE — Progress Notes (Signed)
OFFICE PROGRESS NOTE  CC: Renee Eva, MD [name], MD [name], MD [name], MD [address] [place] [address] [name], MD  DIAGNOSIS: 46 year old female with new diagnosis of rectal carcinoma by rectal ultrasound she was found to have a T3 N1 lesion.  PRIOR THERAPY:   #1 patient presented with a several month history of diarrhea and then subsequent bleeding. She was found to be anemic with a hemoglobin of 10. Because of this she went on to have a colonoscopy performed. The colonoscopy showed multiple sessile polyps in the cecum ascending transverse and sigmoid colon as well as the rectum. The rectal area showed a large exophytic rectal mass approximately 1 cm above the dentate line measuring 3-4 cm. She had a biopsy of this performed and was found to be an adenocarcinoma consistent with a rectal primary.  #2 patient was seen by Dr. [name] at [facility] who performed a rectal ultrasound. The staging clinically was T3 N1. It was recommended by Dr. [name] the patient undergo neoadjuvant chemotherapy and radiation higher to her definitive surgery.  #3 Patient has completed concurrent radiation and chemotherapy. Her chemotherapy consisted of single agent xeloda administrated 08/22/11 - 10/09/2011. Overall she tolerated her treatment very well  #4 Has completed all of neoadjuvant treatment And the PET scan shows no evidence of distant metastasis and a para rectal peritoneal nodes less impressive she has had significant response to therapy.  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 APC is negative  #6 patient had a laparotomy with proctocolectomy, J-pouch with ileoanal anastomosis, loop ileostomy on 12/14/2011. The final pathology did not reveal any evidence of residual disease. Patient had a  complicated postop course including high output ileostomy. She required intensive IV fluids on an outpatient basis to prevent dehydration.  #7 patient began adjuvant chemotherapy consisting of XELOX starting 03/05/2012 on a 21 day cycle. A Total of 6 cycles were planned unfortunate patient was not able to tolerate this therapy due to side effects from the xeloda With development of hand-foot syndrome.She was not able to tolerate oxaliplatin due to significant toxicity from cold sensitivity. In that this treatment is being discontinued.  CURRENT THERAPY: We are planning on doing FOLFIRI adjuvantly  INTERVAL HISTORY: Renee Nicholson 46 y.o. female returns for followup visit today.Today she is doing well. She again developed cold sensitivity with external a platinum on her last infusion last week. She also has some residual tingling and numbness in her hands and feet. She is very weak tired and fatigued. She denies any fevers chills night sweats headaches or shortness of breath or chest pains. Remainder of the 10 point review of systems is negative.  MEDICAL HISTORY: Past Medical History  Diagnosis Date  . Anemia   . Diarrhea   . Rectal cancer 08/16/2011  . Colon cancer   . Anxiety   . Bright red rectal bleeding 09/13/2011  . History of chemotherapy     completed 09/2011   . Hx of radiation therapy 08/29/11 to 10/09/11    rectum    ALLERGIES:   has no known allergies.  MEDICATIONS:  Current Outpatient Prescriptions  Medication Sig Dispense Refill  . acetaminophen (TYLENOL) 325 MG tablet Take 2 tablets (650 mg total) by mouth every 4 (four) hours as needed.  200 tablet  1  . feeding supplement (  ENSURE COMPLETE) LIQD Take 237 mLs by mouth 3 (three) times daily between meals.  50 Bottle  6  . feeding supplement (RESOURCE BREEZE) LIQD Take 1 Container by mouth 3 (three) times daily between meals.  30 Container  6  . Ferrous Sulfate (IRON) 28 MG TABS Take 1 tablet by mouth daily.      . Heparin  Lock Flush (HEPARIN FLUSH, PORCINE,) 100 UNIT/ML injection 1 mL (100 Units total) by Intracatheter route as needed (250 units per lumen).  5 Syringe  3  . HYDROcodone-acetaminophen (VICODIN) 5-500 MG per tablet Take 1-2 tablets by mouth every 6 (six) hours as needed for pain.  30 tablet  0  . lidocaine-prilocaine (EMLA) cream Apply topically as needed.  30 g  6  . loperamide (IMODIUM) 2 MG capsule Take 2 capsules by mouth Every 4 hours as needed.      . megestrol (MEGACE) 400 MG/10ML suspension Take 10 mLs (400 mg total) by mouth 2 (two) times daily.  240 mL  1  . Multiple Vitamin (MULTIVITAMIN) tablet Take 1 tablet by mouth every morning.       . pantoprazole (PROTONIX) 40 MG tablet Take 40 mg by mouth daily.      . polysaccharide iron (NIFEREX) 150 MG CAPS capsule Take 150 mg by mouth every morning.       . simethicone (MYLICON) 80 MG chewable tablet Chew 80 mg by mouth every 6 (six) hours as needed. For gas        SURGICAL HISTORY:  Past Surgical History  Procedure Date  . Tubal ligation   . Carpal tunnel release   . Colonoscopy 07/26/2011    Procedure: COLONOSCOPY;  Surgeon: Arlyce Harman, MD;  [facility];  Service: Endoscopy;  Laterality: N/A;  10:40  . Esophagogastroduodenoscopy 07/26/2011    Procedure: ESOPHAGOGASTRODUODENOSCOPY (EGD);  Surgeon: Arlyce Harman, MD;  [facility];  Service: Endoscopy;  Laterality: N/A;  . Esophagogastroduodenoscopy 12/27/2011    Procedure: ESOPHAGOGASTRODUODENOSCOPY (EGD);  Surgeon: Shirley Friar, MD;  Location: [facility];  Service: Endoscopy;  Laterality: N/A;  . Laparoscopic colon resection 12/14/11    diverting ileostomy  . Portacath placement 02/21/2012    Procedure: INSERTION PORT-A-CATH;  Surgeon: [name], MD;  Location: [facility];  Service: General;  Laterality: N/A;    REVIEW OF SYSTEMS:  Pertinent items are noted in HPI.   PHYSICAL EXAMINATION: General appearance: alert, cooperative, appears stated age, mild  distress and pale Resp: clear to auscultation bilaterally and normal percussion bilaterally Cardio: regular rate and rhythm, S1, S2 normal, no murmur, click, rub or gallop GI: soft, non-tender; bowel sounds normal; no masses,  no organomegaly Extremities: extremities normal, atraumatic, no cyanosis or edema Neurologic: Alert and oriented X 3, normal strength and tone. Normal symmetric reflexes. Normal coordination and gait Rectal Exam: patient does not have any active bleeding, there is noted to be external hemorrhoids.  ECOG PERFORMANCE STATUS: 0 - Asymptomatic  Blood pressure 123/81, pulse 75, temperature 97.8 F (36.6 C), resp. rate 20, height 4\' 9"  (1.448 m), weight 82 lb 12.8 oz (37.558 kg), last menstrual period 02/20/2012.  LABORATORY DATA: Lab Results  Component Value Date   WBC 3.7* 05/06/2012   HGB 11.8 05/06/2012   HCT 34.6* 05/06/2012   MCV 89.7 05/06/2012   PLT 76* 05/06/2012      Chemistry      Component Value Date/Time   NA 140 05/01/2012 0901   K 3.3* 05/01/2012 0901  CL 107 05/01/2012 0901   CO2 23 05/01/2012 0901   BUN 9 05/01/2012 0901   CREATININE 0.67 05/01/2012 0901   CREATININE 0.70 07/27/2011 1306      Component Value Date/Time   CALCIUM 8.4 05/01/2012 0901   ALKPHOS 76 05/01/2012 0901   AST 42* 05/01/2012 0901   ALT 33 05/01/2012 0901   BILITOT 0.7 05/01/2012 0901     ADDITIONAL INFORMATION: 1. Following placement in clearing solution, three more lymph nodes were identified, all of which are benign. Thus, the total lymph node count is 11. (JK:mw 12-18-11) 2. Following an exhaustive search, additional tissue has been submitted in an attempt to recover lymph nodes. Two more lymph nodes were identified, both benign. This brings the total number of lymph nodes recovered to 13, all of which are benign. (JBK:eps 12/26/11) FINAL DIAGNOSIS Diagnosis 1. Colon, resection margin (donut), distal rectal - COLORECTAL MUCOSA WITH FIBROSIS AND EDEMA, CONSISTENT WITH TREATMENT. - THERE  IS NO EVIDENCE OF MALIGNANCY. 2. Colon, total resection (incl lymph nodes) - COLON WITH SCATTERED TUBULAR ADENOMAS. - HIGH GRADE DYSPLASIA IS NOT IDENTIFIED. - BENIGN APPENDIX WITH FIBROUS OBLITERATION OF THE TIP. - THERE IS NO EVIDENCE OF CARCINOMA IN 8 OF 8 LYMPH NODES (0/8). - SEE ONCOLOGY TABLE BELOW. 3. Colon, resection margin (donut), new distal margin and rectum - BENIGN SQUAMOUS LINED MUCOSA. - THERE IS NO EVIDENCE OF MALIGNANCY. Microscopic Comment 2. COLON AND RECTUM Specimen: Colon, appendix, and terminal ileum. Procedure: Total colectomy. Tumor site: N/A Specimen integrity: Intact. Macroscopic intactness of mesorectum: Complete. Macroscopic tumor perforation: N/A Invasive tumor: N/A 1 of 3 FINAL for NEFTALY, INZUNZA ([id number]) Microscopic Comment(continued) Microscopic extension of invasive tumor: N/A Lymph-Vascular invasion: N/A Peri-neural invasion: N/A Tumor deposit(s) (discontinuous extramural extension): N/A Resection margins: Negative for adenocarcinoma. Treatment effect (neoadjuvant therapy): Present, significant. Number of lymph nodes examined- 8 ; Number positive - 0 (PLEASE NOTE: Additional tissue will be submitted in an attempt to recover more lymph nodes) Additional polyp(s): Multiple scattered tubular adenomas. Pathologic Staging: ypT0, ypN0 Ancillary studies: N/A Comments: Grossly, there is a 1.5 cm granular depressed area of mucosa present at the distal portion of the specimen. This entire area has been submitted for histologic evaluation revealing inflamed, fibrotic, and edematous colorectal mucosa, consistent with treatment effect. Adenocarcinoma is not identified. In addition, there are scattered tubular adenomas throughout the specimen. No high grade dysplasia is identified. The case was discussed with Dr. Donell Beers on 12/15/2011. (JBK:gt, 12/15/11) Pecola Leisure MD Pathologist, Electronic Signature (Case signed 12/15/2011) Valentino Hue  RADIOGRAPHIC  STUDIES:  Nm Pet Image Initial (pi) Skull Base To Thigh 1.  Anorectal primary without evidence of distant metastasis. 2.  Perirectal/peritoneal nodes described on the prior diagnostic CT are less impressive today and may have undergone response to therapy.  Based on location, these were suspicious on the prior. 3.  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 Neoplasm cannot be excluded.  Consider further evaluation with ultrasound. 4.  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  Consider correlation with cardiac risk factors.  5.  Sinus disease.  Original Report Authenticated By: Consuello Bossier, M.D.     ASSESSMENT: 46 year old female with:  #1. stage III adenocarcinoma of the rectum. Patient is now status post definitive surgery after having received neoadjuvant chemotherapy and radiation therapy. Her final pathology does not reveal any evidence of residual disease. 8 nodes were negative for metastatic disease.  #2 . Patient was begun on adjuvant chemotherapy  consisting of Xeloda and tape side of being. She does far has received one cycle. This course was complicated by development of a severe dehydration requiring hospitalization.  #3 Patient and I discussed rationale for discontinuing oxaliplatin and doing FOLFIRI. Risks and benefits of both 5-FU leucovorin and irinotecan were discussed with the patient. She understands literature was given to her. And she would like to proceed with this treatment. I have planned on doing this on 05/14/2012. A total of 4 cycles will be planned.  PLAN: #1 Patient will return in one week's time for followup and cycle 1 of FOLFIRI  #2 she knows to call me with any problems  All questions were answered. The patient knows to call the clinic with any problems, questions or concerns. We can certainly see the patient much sooner if necessary.  I spent 25 minutes counseling the patient face to face. The  total time spent in the appointment was 30 minutes.    Drue Second, MD Medical/Oncology [facility] [phone] (beeper) [phone] (Office)  05/06/2012, 4:46 PM

## 2012-05-07 ENCOUNTER — Telehealth: Payer: Self-pay | Admitting: Medical Oncology

## 2012-05-07 ENCOUNTER — Ambulatory Visit: Payer: BC Managed Care – PPO

## 2012-05-07 MED ORDER — POTASSIUM CHLORIDE CRYS ER 20 MEQ PO TBCR
20.0000 meq | EXTENDED_RELEASE_TABLET | Freq: Every day | ORAL | Status: DC
Start: 2012-05-07 — End: 2012-06-26

## 2012-05-07 NOTE — Telephone Encounter (Signed)
Message copied by Tylene Fantasia on Tue May 07, 2012  8:41 AM ------      Message from: Victorino December      Created: Mon May 06, 2012 10:00 PM       Please call patient: K-dur 20 meq po daily x 5 days

## 2012-05-07 NOTE — Telephone Encounter (Signed)
Per MD, patient to take K-dur 20 meq by mouth daily for 5 days.  Instructions confirmed, no questions at this time.

## 2012-05-14 ENCOUNTER — Telehealth: Payer: Self-pay | Admitting: *Deleted

## 2012-05-14 ENCOUNTER — Ambulatory Visit: Payer: BC Managed Care – PPO | Admitting: Oncology

## 2012-05-14 ENCOUNTER — Ambulatory Visit (HOSPITAL_BASED_OUTPATIENT_CLINIC_OR_DEPARTMENT_OTHER): Payer: BC Managed Care – PPO | Admitting: Oncology

## 2012-05-14 ENCOUNTER — Other Ambulatory Visit (HOSPITAL_BASED_OUTPATIENT_CLINIC_OR_DEPARTMENT_OTHER): Payer: BC Managed Care – PPO | Admitting: Lab

## 2012-05-14 ENCOUNTER — Encounter: Payer: Self-pay | Admitting: Oncology

## 2012-05-14 ENCOUNTER — Other Ambulatory Visit: Payer: BC Managed Care – PPO | Admitting: Lab

## 2012-05-14 ENCOUNTER — Ambulatory Visit: Payer: BC Managed Care – PPO

## 2012-05-14 VITALS — BP 105/77 | HR 80 | Temp 98.4°F | Resp 20 | Ht <= 58 in | Wt 82.6 lb

## 2012-05-14 DIAGNOSIS — C20 Malignant neoplasm of rectum: Secondary | ICD-10-CM

## 2012-05-14 DIAGNOSIS — C218 Malignant neoplasm of overlapping sites of rectum, anus and anal canal: Secondary | ICD-10-CM

## 2012-05-14 DIAGNOSIS — E079 Disorder of thyroid, unspecified: Secondary | ICD-10-CM

## 2012-05-14 LAB — CBC WITH DIFFERENTIAL/PLATELET
Basophils Absolute: 0 10*3/uL (ref 0.0–0.1)
EOS%: 4.5 % (ref 0.0–7.0)
Eosinophils Absolute: 0.2 10*3/uL (ref 0.0–0.5)
HCT: 32.5 % — ABNORMAL LOW (ref 34.8–46.6)
HGB: 11.3 g/dL — ABNORMAL LOW (ref 11.6–15.9)
LYMPH%: 23.9 % (ref 14.0–49.7)
MCH: 30 pg (ref 25.1–34.0)
MCV: 86.2 fL (ref 79.5–101.0)
MONO%: 11.9 % (ref 0.0–14.0)
NEUT#: 2.4 10*3/uL (ref 1.5–6.5)
NEUT%: 59 % (ref 38.4–76.8)
Platelets: 83 10*3/uL — ABNORMAL LOW (ref 145–400)

## 2012-05-14 NOTE — Progress Notes (Signed)
OFFICE PROGRESS NOTE  CC: [name], MD [name], MD [name], MD [name], MD [address] [place] [address] [name], MD  DIAGNOSIS: 46 year old female with new diagnosis of rectal carcinoma by rectal ultrasound she was found to have a T3 N1 lesion.  PRIOR THERAPY:   #1 patient presented with a several month history of diarrhea and then subsequent bleeding. She was found to be anemic with a hemoglobin of 10. Because of this she went on to have a colonoscopy performed. The colonoscopy showed multiple sessile polyps in the cecum ascending transverse and sigmoid colon as well as the rectum. The rectal area showed a large exophytic rectal mass approximately 1 cm above the dentate line measuring 3-4 cm. She had a biopsy of this performed and was found to be an adenocarcinoma consistent with a rectal primary.  #2 patient was seen by Dr. [name] at [facility] who performed a rectal ultrasound. The staging clinically was T3 N1. It was recommended by Dr. [name] the patient undergo neoadjuvant chemotherapy and radiation higher to her definitive surgery.  #3 Patient has completed concurrent radiation and chemotherapy. Her chemotherapy consisted of single agent xeloda administrated 08/22/11 - 10/09/2011. Overall she tolerated her treatment very well  #4 Has completed all of neoadjuvant treatment And the PET scan shows no evidence of distant metastasis and a para rectal peritoneal nodes less impressive she has had significant response to therapy.  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 APC is negative  #6 patient had a laparotomy with proctocolectomy, J-pouch with ileoanal anastomosis, loop ileostomy on 12/14/2011. The final pathology did not reveal any evidence of residual disease. Patient had a  complicated postop course including high output ileostomy. She required intensive IV fluids on an outpatient basis to prevent dehydration.  #7 patient began adjuvant chemotherapy consisting of XELOX starting 03/05/2012 on a 21 day cycle. A Total of 6 cycles were planned unfortunate patient was not able to tolerate this therapy due to side effects from the xeloda With development of hand-foot syndrome.She was not able to tolerate oxaliplatin due to significant toxicity from cold sensitivity. In that this treatment is being discontinued.  CURRENT THERAPY: We are planning on doing FOLFIRI adjuvantly  INTERVAL HISTORY: Renee Nicholson 46 y.o. female returns for followup visit today.Today she is doing well. No complaints are presented except for weakness. 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 Remainder of the 10 point review of systems is negative.  MEDICAL HISTORY: Past Medical History  Diagnosis Date  . Anemia   . Diarrhea   . Rectal cancer 08/16/2011  . Colon cancer   . Anxiety   . Bright red rectal bleeding 09/13/2011  . History of chemotherapy     completed 09/2011   . Hx of radiation therapy 08/29/11 to 10/09/11    rectum    ALLERGIES:   has no known allergies.  MEDICATIONS:  Current Outpatient Prescriptions  Medication Sig Dispense Refill  . acetaminophen (TYLENOL) 325 MG tablet Take 2 tablets (650 mg total) by mouth every 4 (four) hours as needed.  200 tablet  1  . feeding supplement (ENSURE COMPLETE) LIQD Take 237 mLs by mouth 3 (three) times daily between meals.  50 Bottle  6  . feeding supplement (RESOURCE BREEZE) LIQD Take 1 Container by mouth 3 (three) times  daily between meals.  30 Container  6  . Ferrous Sulfate (IRON) 28 MG TABS Take 1 tablet by mouth daily.      . Heparin Lock Flush (HEPARIN FLUSH, PORCINE,) 100 UNIT/ML injection 1 mL (100 Units total) by Intracatheter route as needed (250 units per lumen).  5 Syringe  3  . HYDROcodone-acetaminophen  (VICODIN) 5-500 MG per tablet Take 1-2 tablets by mouth every 6 (six) hours as needed for pain.  30 tablet  0  . lidocaine-prilocaine (EMLA) cream Apply topically as needed.  30 g  6  . loperamide (IMODIUM) 2 MG capsule Take 2 capsules by mouth Every 4 hours as needed.      . megestrol (MEGACE) 400 MG/10ML suspension Take 10 mLs (400 mg total) by mouth 2 (two) times daily.  240 mL  1  . Multiple Vitamin (MULTIVITAMIN) tablet Take 1 tablet by mouth every morning.       . pantoprazole (PROTONIX) 40 MG tablet Take 40 mg by mouth daily.      . polysaccharide iron (NIFEREX) 150 MG CAPS capsule Take 150 mg by mouth every morning.       . potassium chloride SA (K-DUR,KLOR-CON) 20 MEQ tablet Take 1 tablet (20 mEq total) by mouth daily.  5 tablet  0  . simethicone (MYLICON) 80 MG chewable tablet Chew 80 mg by mouth every 6 (six) hours as needed. For gas        SURGICAL HISTORY:  Past Surgical History  Procedure Date  . Tubal ligation   . Carpal tunnel release   . Colonoscopy 07/26/2011    Procedure: COLONOSCOPY;  Surgeon: Arlyce Harman, MD;  [facility]: [facility];  Service: Endoscopy;  Laterality: N/A;  10:40  . Esophagogastroduodenoscopy 07/26/2011    Procedure: ESOPHAGOGASTRODUODENOSCOPY (EGD);  Surgeon: Arlyce Harman, MD;  [facility]: [facility];  Service: Endoscopy;  Laterality: N/A;  . Esophagogastroduodenoscopy 12/27/2011    Procedure: ESOPHAGOGASTRODUODENOSCOPY (EGD);  Surgeon: Shirley Friar, MD;  Location: [facility];  Service: Endoscopy;  Laterality: N/A;  . Laparoscopic colon resection 12/14/11    diverting ileostomy  . Portacath placement 02/21/2012    Procedure: INSERTION PORT-A-CATH;  Surgeon: [name], MD;  Location: [facility];  Service: General;  Laterality: N/A;    REVIEW OF SYSTEMS:  Pertinent items are noted in HPI.   PHYSICAL EXAMINATION:  otomy site clear no evidence of bleeding  ECOG PERFORMANCE STATUS: 0 - Asymptomatic  Blood pressure 105/77, pulse 80,  temperature 98.4 F (36.9 C), temperature source Oral, resp. rate 20, height 4\' 9"  (1.448 m), weight 82 lb 9.6 oz (37.467 kg), last menstrual period 02/20/2012.  LABORATORY DATA: Lab Results  Component Value Date   WBC 4.0 05/14/2012   HGB 11.3* 05/14/2012   HCT 32.5* 05/14/2012   MCV 86.2 05/14/2012   PLT 83* 05/14/2012      Chemistry      Component Value Date/Time   NA 139 05/06/2012 1316   K 3.3* 05/06/2012 1316   CL 104 05/06/2012 1316   CO2 27 05/06/2012 1316   BUN 8 05/06/2012 1316   CREATININE 0.70 05/06/2012 1316   CREATININE 0.70 07/27/2011 1306      Component Value Date/Time   CALCIUM 9.0 05/06/2012 1316   ALKPHOS 81 05/06/2012 1316   AST 55* 05/06/2012 1316   ALT 44* 05/06/2012 1316   BILITOT 0.7 05/06/2012 1316     ADDITIONAL INFORMATION: 1. Following placement in clearing solution, three more lymph nodes were identified, all  of which are benign. Thus, the total lymph node count is 11. (JK:mw 12-18-11) 2. Following an exhaustive search, additional tissue has been submitted in an attempt to recover lymph nodes. Two more lymph nodes were identified, both benign. This brings the total number of lymph nodes recovered to 13, all of which are benign. (JBK:eps 12/26/11) FINAL DIAGNOSIS Diagnosis 1. Colon, resection margin (donut), distal rectal - COLORECTAL MUCOSA WITH FIBROSIS AND EDEMA, CONSISTENT WITH TREATMENT. - THERE IS NO EVIDENCE OF MALIGNANCY. 2. Colon, total resection (incl lymph nodes) - COLON WITH SCATTERED TUBULAR ADENOMAS. - HIGH GRADE DYSPLASIA IS NOT IDENTIFIED. - BENIGN APPENDIX WITH FIBROUS OBLITERATION OF THE TIP. - THERE IS NO EVIDENCE OF CARCINOMA IN 8 OF 8 LYMPH NODES (0/8). - SEE ONCOLOGY TABLE BELOW. 3. Colon, resection margin (donut), new distal margin and rectum - BENIGN SQUAMOUS LINED MUCOSA. - THERE IS NO EVIDENCE OF MALIGNANCY. Microscopic Comment 2. COLON AND RECTUM Specimen: Colon, appendix, and terminal ileum. Procedure: Total colectomy. Tumor  site: N/A Specimen integrity: Intact. Macroscopic intactness of mesorectum: Complete. Macroscopic tumor perforation: N/A Invasive tumor: N/A 1 of 3 FINAL for Renee, Nicholson ([id number]) Microscopic Comment(continued) Microscopic extension of invasive tumor: N/A Lymph-Vascular invasion: N/A Peri-neural invasion: N/A Tumor deposit(s) (discontinuous extramural extension): N/A Resection margins: Negative for adenocarcinoma. Treatment effect (neoadjuvant therapy): Present, significant. Number of lymph nodes examined- 8 ; Number positive - 0 (PLEASE NOTE: Additional tissue will be submitted in an attempt to recover more lymph nodes) Additional polyp(s): Multiple scattered tubular adenomas. Pathologic Staging: ypT0, ypN0 Ancillary studies: N/A Comments: Grossly, there is a 1.5 cm granular depressed area of mucosa present at the distal portion of the specimen. This entire area has been submitted for histologic evaluation revealing inflamed, fibrotic, and edematous colorectal mucosa, consistent with treatment effect. Adenocarcinoma is not identified. In addition, there are scattered tubular adenomas throughout the specimen. No high grade dysplasia is identified. The case was discussed with Dr. Donell Beers on 12/15/2011. (JBK:gt, 12/15/11) Pecola Leisure MD Pathologist, Electronic Signature (Case signed 12/15/2011) Valentino Hue  RADIOGRAPHIC STUDIES:  Nm Pet Image Initial (pi) Skull Base To Thigh 1.  Anorectal primary without evidence of distant metastasis. 2.  Perirectal/peritoneal nodes described on the prior diagnostic CT are less impressive today and may have undergone response to therapy.  Based on location, these were suspicious on the prior. 3.  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 Neoplasm cannot be excluded.  Consider further evaluation with ultrasound. 4.  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  Consider correlation with  cardiac risk factors.  5.  Sinus disease.  Original Report Authenticated By: Consuello Bossier, M.D.     ASSESSMENT: 46 year old female with:  #1. stage III adenocarcinoma of the rectum. Patient is now status post definitive surgery after having received neoadjuvant chemotherapy and radiation therapy. Her final pathology does not reveal any evidence of residual disease. 8 nodes were negative for metastatic disease.  #2 . Patient was begun on adjuvant chemotherapy consisting of Xeloda and tape side of being. She does far has received one cycle. This course was complicated by development of a severe dehydration requiring hospitalization.  #3 Patient and I discussed rationale for discontinuing oxaliplatin and doing FOLFIRI. Risks and benefits of both 5-FU leucovorin and irinotecan were discussed with the patient. She understands literature was given to her. A A total of 4 cycles will be planned.  PLAN: #1 Hold chemotherapy due to thrombocytopenia for another 2 weeks  #2 she  knows to call me with any problems  All questions were answered. The patient knows to call the clinic with any problems, questions or concerns. We can certainly see the patient much sooner if necessary.  I spent 25 minutes counseling the patient face to face. The total time spent in the appointment was 30 minutes.    Drue Second, MD Medical/Oncology [facility] [phone] (beeper) [phone] (Office)  05/14/2012, 9:19 AM

## 2012-05-14 NOTE — Telephone Encounter (Signed)
Sent email to [facility] to set up patient appointments for treatment and pump d/c  06-04-2012  06-06-2012 pump d/c  06-18-2012  06-20-2012 pump d/c  07-02-2012  07-04-2012 pump d/c  07-16-2012  07-18-2012 pump d/c

## 2012-05-14 NOTE — Telephone Encounter (Signed)
Per staff message and POF I have scheduled appts.  JMW  

## 2012-05-14 NOTE — Patient Instructions (Addendum)
Hold chemotherapy today because of low platelets  Return on 06/04/12 for cycle 1 of FOLFIRI

## 2012-05-20 ENCOUNTER — Ambulatory Visit: Payer: BC Managed Care – PPO

## 2012-05-20 ENCOUNTER — Other Ambulatory Visit: Payer: BC Managed Care – PPO | Admitting: Lab

## 2012-05-20 ENCOUNTER — Ambulatory Visit: Payer: BC Managed Care – PPO | Admitting: Oncology

## 2012-05-23 ENCOUNTER — Ambulatory Visit (HOSPITAL_BASED_OUTPATIENT_CLINIC_OR_DEPARTMENT_OTHER): Payer: BC Managed Care – PPO

## 2012-05-23 ENCOUNTER — Telehealth: Payer: Self-pay | Admitting: *Deleted

## 2012-05-23 ENCOUNTER — Other Ambulatory Visit: Payer: Self-pay | Admitting: Medical Oncology

## 2012-05-23 ENCOUNTER — Ambulatory Visit (HOSPITAL_BASED_OUTPATIENT_CLINIC_OR_DEPARTMENT_OTHER): Payer: BC Managed Care – PPO | Admitting: Lab

## 2012-05-23 ENCOUNTER — Ambulatory Visit (HOSPITAL_BASED_OUTPATIENT_CLINIC_OR_DEPARTMENT_OTHER): Payer: BC Managed Care – PPO | Admitting: Oncology

## 2012-05-23 ENCOUNTER — Telehealth: Payer: Self-pay | Admitting: Medical Oncology

## 2012-05-23 VITALS — BP 103/68 | HR 80

## 2012-05-23 VITALS — BP 92/62 | HR 87 | Temp 98.3°F | Resp 20 | Ht <= 58 in | Wt 82.7 lb

## 2012-05-23 DIAGNOSIS — C20 Malignant neoplasm of rectum: Secondary | ICD-10-CM

## 2012-05-23 DIAGNOSIS — R197 Diarrhea, unspecified: Secondary | ICD-10-CM

## 2012-05-23 DIAGNOSIS — E860 Dehydration: Secondary | ICD-10-CM

## 2012-05-23 LAB — CBC WITH DIFFERENTIAL/PLATELET
BASO%: 0.3 % (ref 0.0–2.0)
HCT: 32.3 % — ABNORMAL LOW (ref 34.8–46.6)
LYMPH%: 36.9 % (ref 14.0–49.7)
MCHC: 35 g/dL (ref 31.5–36.0)
MCV: 85.9 fL (ref 79.5–101.0)
MONO#: 0.5 10*3/uL (ref 0.1–0.9)
MONO%: 16.9 % — ABNORMAL HIGH (ref 0.0–14.0)
NEUT%: 43.5 % (ref 38.4–76.8)
Platelets: 150 10*3/uL (ref 145–400)
RBC: 3.76 10*6/uL (ref 3.70–5.45)

## 2012-05-23 LAB — COMPREHENSIVE METABOLIC PANEL (CC13)
ALT: 23 U/L (ref 0–55)
Alkaline Phosphatase: 103 U/L (ref 40–150)
CO2: 27 mEq/L (ref 22–29)
Creatinine: 0.8 mg/dL (ref 0.6–1.1)
Glucose: 118 mg/dl — ABNORMAL HIGH (ref 70–99)
Sodium: 138 mEq/L (ref 136–145)
Total Bilirubin: 0.6 mg/dL (ref 0.20–1.20)

## 2012-05-23 MED ORDER — HEPARIN SOD (PORK) LOCK FLUSH 100 UNIT/ML IV SOLN
500.0000 [IU] | Freq: Once | INTRAVENOUS | Status: AC
  Filled 2012-05-23: qty 5

## 2012-05-23 MED ORDER — SODIUM CHLORIDE 0.9 % IJ SOLN
10.0000 mL | INTRAMUSCULAR | Status: AC | PRN
  Filled 2012-05-23: qty 10

## 2012-05-23 MED ORDER — SODIUM CHLORIDE 0.9 % IV SOLN
Freq: Once | INTRAVENOUS | Status: AC
  Administered 2012-05-23: 13:00:00 via INTRAVENOUS

## 2012-05-23 NOTE — Progress Notes (Signed)
OFFICE PROGRESS NOTE  CC: Renee Eva, MD [name], MD [name], MD [name], MD [address] 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  PRIOR THERAPY:   #1 patient presented with a several month history of diarrhea and then subsequent bleeding. She was found to be anemic with a hemoglobin of 10. Because of this she went on to have a colonoscopy performed. The colonoscopy showed multiple sessile polyps in the cecum ascending transverse and sigmoid colon as well as the rectum. The rectal area showed a large exophytic rectal mass approximately 1 cm above the dentate line measuring 3-4 cm. She had a biopsy of this performed and was found to be an adenocarcinoma consistent with a rectal primary.  #2 patient was seen by Dr. [name] at [facility] who performed a rectal ultrasound. The staging clinically was T3 N1. It was recommended by Dr. [name] the patient undergo neoadjuvant chemotherapy and radiation higher to her definitive surgery.  #3 Patient has completed concurrent radiation and chemotherapy. Her chemotherapy consisted of single agent xeloda administrated 08/22/11 - 10/09/2011. Overall she tolerated her treatment very well  #4 Has completed all of neoadjuvant treatment And the PET scan shows no evidence of distant metastasis and a para rectal peritoneal nodes less impressive she has had significant response to therapy.  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 APC is negative  #6 patient had a laparotomy with proctocolectomy, J-pouch with ileoanal anastomosis, loop ileostomy on 12/14/2011. The final pathology did not reveal any evidence of residual disease. Patient had a  complicated postop course including high output ileostomy. She required intensive IV fluids on an outpatient basis to prevent dehydration.  #7 patient began adjuvant chemotherapy consisting of XELOX starting 03/05/2012 on a 21 day cycle. A Total of 6 cycles were planned unfortunate patient was not able to tolerate this therapy due to side effects from the xeloda With development of hand-foot syndrome.She was not able to tolerate oxaliplatin due to significant toxicity from cold sensitivity. In that this treatment is being discontinued.  CURRENT THERAPY: We are planning on doing FOLFIRI adjuvantly  INTERVAL HISTORY: Renee Nicholson 46 y.o. female returns for workin visit today.Her husband called as she was having diarrhea all night long. She has be very bloated and not able to get relief from her diarrhea. She has been trying drink as much as she possibly can, no nausea or vomiting. She has also noticed some mucous from her rectal area.No bleeding. Remainder of the 10 point review of systems is negative. MEDICAL HISTORY: Past Medical History  Diagnosis Date  . Anemia   . Diarrhea   . Rectal cancer 08/16/2011  . Colon cancer   . Anxiety   . Bright red rectal bleeding 09/13/2011  . History of chemotherapy     completed 09/2011   . Hx of radiation therapy 08/29/11 to 10/09/11    rectum    ALLERGIES:   has no known allergies.  MEDICATIONS:  Current Outpatient Prescriptions  Medication Sig Dispense Refill  . acetaminophen (TYLENOL) 325 MG tablet Take 2 tablets (650 mg total) by mouth every 4 (four) hours as needed.  200 tablet  1  . feeding supplement (ENSURE COMPLETE) LIQD Take 237  mLs by mouth 3 (three) times daily between meals.  50 Bottle  6  . feeding supplement (RESOURCE BREEZE) LIQD Take 1 Container by mouth 3 (three) times daily between meals.  30 Container  6  . Ferrous Sulfate (IRON) 28 MG TABS Take 1 tablet by mouth daily.      . Heparin Lock Flush (HEPARIN FLUSH, PORCINE,) 100 UNIT/ML  injection 1 mL (100 Units total) by Intracatheter route as needed (250 units per lumen).  5 Syringe  3  . HYDROcodone-acetaminophen (VICODIN) 5-500 MG per tablet Take 1-2 tablets by mouth every 6 (six) hours as needed for pain.  30 tablet  0  . lidocaine-prilocaine (EMLA) cream Apply topically as needed.  30 g  6  . loperamide (IMODIUM) 2 MG capsule Take 2 capsules by mouth Every 4 hours as needed.      . megestrol (MEGACE) 400 MG/10ML suspension Take 10 mLs (400 mg total) by mouth 2 (two) times daily.  240 mL  1  . Multiple Vitamin (MULTIVITAMIN) tablet Take 1 tablet by mouth every morning.       . pantoprazole (PROTONIX) 40 MG tablet Take 40 mg by mouth daily.      . polysaccharide iron (NIFEREX) 150 MG CAPS capsule Take 150 mg by mouth every morning.       . potassium chloride SA (K-DUR,KLOR-CON) 20 MEQ tablet Take 1 tablet (20 mEq total) by mouth daily.  5 tablet  0  . simethicone (MYLICON) 80 MG chewable tablet Chew 80 mg by mouth every 6 (six) hours as needed. For gas        SURGICAL HISTORY:  Past Surgical History  Procedure Date  . Tubal ligation   . Carpal tunnel release   . Colonoscopy 07/26/2011    Procedure: COLONOSCOPY;  Surgeon: Arlyce Harman, MD;  Location: [facility];  Service: Endoscopy;  Laterality: N/A;  10:40  . Esophagogastroduodenoscopy 07/26/2011    Procedure: ESOPHAGOGASTRODUODENOSCOPY (EGD);  Surgeon: Arlyce Harman, MD;  Location: [facility];  Service: Endoscopy;  Laterality: N/A;  . Esophagogastroduodenoscopy 12/27/2011    Procedure: ESOPHAGOGASTRODUODENOSCOPY (EGD);  Surgeon: Shirley Friar, MD;  Location: [facility];  Service: Endoscopy;  Laterality: N/A;  . Laparoscopic colon resection 12/14/11    diverting ileostomy  . Portacath placement 02/21/2012    Procedure: INSERTION PORT-A-CATH;  Surgeon: [name], MD;  Location: [facility] OR;  Service: General;  Laterality: N/A;    REVIEW OF SYSTEMS:  Pertinent items are noted in HPI.   PHYSICAL  EXAMINATION:  otomy site clear no evidence of bleeding  ECOG PERFORMANCE STATUS: 0 - Asymptomatic  Blood pressure 92/62, pulse 87, temperature 98.3 F (36.8 C), temperature source Oral, resp. rate 20, height 4\' 9"  (1.448 m), weight 82 lb 11.2 oz (37.512 kg), last menstrual period 02/20/2012.  LABORATORY DATA: Lab Results  Component Value Date   WBC 2.9* 05/23/2012   HGB 11.3* 05/23/2012   HCT 32.3* 05/23/2012   MCV 85.9 05/23/2012   PLT 150 05/23/2012      Chemistry      Component Value Date/Time   NA 138 05/23/2012 1115   NA 139 05/06/2012 1316   K 3.8 05/23/2012 1115   K 3.3* 05/06/2012 1316   CL 101 05/23/2012 1115   CL 104 05/06/2012 1316   CO2 27 05/23/2012 1115   CO2 27 05/06/2012 1316   BUN 13.0 05/23/2012 1115   BUN 8 05/06/2012 1316   CREATININE 0.8 05/23/2012 1115   CREATININE 0.70  05/06/2012 1316   CREATININE 0.70 07/27/2011 1306      Component Value Date/Time   CALCIUM 9.3 05/23/2012 1115   CALCIUM 9.0 05/06/2012 1316   ALKPHOS 103 05/23/2012 1115   ALKPHOS 81 05/06/2012 1316   AST 32 05/23/2012 1115   AST 55* 05/06/2012 1316   ALT 23 05/23/2012 1115   ALT 44* 05/06/2012 1316   BILITOT 0.60 05/23/2012 1115   BILITOT 0.7 05/06/2012 1316     ADDITIONAL INFORMATION: 1. Following placement in clearing solution, three more lymph nodes were identified, all of which are benign. Thus, the total lymph node count is 11. (JK:mw 12-18-11) 2. Following an exhaustive search, additional tissue has been submitted in an attempt to recover lymph nodes. Two more lymph nodes were identified, both benign. This brings the total number of lymph nodes recovered to 13, all of which are benign. (JBK:eps 12/26/11) FINAL DIAGNOSIS Diagnosis 1. Colon, resection margin (donut), distal rectal - COLORECTAL MUCOSA WITH FIBROSIS AND EDEMA, CONSISTENT WITH TREATMENT. - THERE IS NO EVIDENCE OF MALIGNANCY. 2. Colon, total resection (incl lymph nodes) - COLON WITH SCATTERED TUBULAR ADENOMAS. - HIGH GRADE  DYSPLASIA IS NOT IDENTIFIED. - BENIGN APPENDIX WITH FIBROUS OBLITERATION OF THE TIP. - THERE IS NO EVIDENCE OF CARCINOMA IN 8 OF 8 LYMPH NODES (0/8). - SEE ONCOLOGY TABLE BELOW. 3. Colon, resection margin (donut), new distal margin and rectum - BENIGN SQUAMOUS LINED MUCOSA. - THERE IS NO EVIDENCE OF MALIGNANCY. Microscopic Comment 2. COLON AND RECTUM Specimen: Colon, appendix, and terminal ileum. Procedure: Total colectomy. Tumor site: N/A Specimen integrity: Intact. Macroscopic intactness of mesorectum: Complete. Macroscopic tumor perforation: N/A Invasive tumor: N/A 1 of 3 FINAL for Renee Nicholson, Renee Nicholson ([id number]) Microscopic Comment(continued) Microscopic extension of invasive tumor: N/A Lymph-Vascular invasion: N/A Peri-neural invasion: N/A Tumor deposit(s) (discontinuous extramural extension): N/A Resection margins: Negative for adenocarcinoma. Treatment effect (neoadjuvant therapy): Present, significant. Number of lymph nodes examined- 8 ; Number positive - 0 (PLEASE NOTE: Additional tissue will be submitted in an attempt to recover more lymph nodes) Additional polyp(s): Multiple scattered tubular adenomas. Pathologic Staging: ypT0, ypN0 Ancillary studies: N/A Comments: Grossly, there is a 1.5 cm granular depressed area of mucosa present at the distal portion of the specimen. This entire area has been submitted for histologic evaluation revealing inflamed, fibrotic, and edematous colorectal mucosa, consistent with treatment effect. Adenocarcinoma is not identified. In addition, there are scattered tubular adenomas throughout the specimen. No high grade dysplasia is identified. The case was discussed with Dr. Donell Beers on 12/15/2011. (JBK:gt, 12/15/11) Pecola Leisure MD Pathologist, Electronic Signature (Case signed 12/15/2011) Intraoper  RADIOGRAPHIC STUDIES:    ASSESSMENT: 46 year old female with:  #1. stage III adenocarcinoma of the rectum. Patient is now status post  definitive surgery after having received neoadjuvant chemotherapy and radiation therapy. Her final pathology does not reveal any evidence of residual disease. 8 nodes were negative for metastatic disease.  #2 . Patient was begun on adjuvant chemotherapy consisting of Xeloda and tape side of being. She does far has received one cycle. This course was complicated by development of a severe dehydration requiring hospitalization.  #3 Patient and I discussed rationale for discontinuing oxaliplatin and doing FOLFIRI. Risks and benefits of both 5-FU leucovorin and irinotecan were discussed with the patient. She understands literature was given to her. A A total of 4 cycles will be planned.  #4 diarrhea and dehydration  #5 Mucous discharge from the rectal area  PLAN: #1 IVF today and tomorrow  2. She will reurn in  2 weeks times for FOLFIRI  3. Neutropenia monitor  4. Send back to Dr. Donell Beers for follow up  All questions were answered. The patient knows to call the clinic with any problems, questions or concerns. We can certainly see the patient much sooner if necessary.  I spent >15 minutes counseling the patient face to face. The total time spent in the appointment was 30 minutes.    Drue Second, MD Medical/Oncology [facility] [phone] (beeper) [phone] (Office)  05/23/2012, 12:44 PM

## 2012-05-23 NOTE — Patient Instructions (Addendum)
Avoid milk products  Return on 8/30 for IVF  I will see you back as scheduled

## 2012-05-23 NOTE — Telephone Encounter (Signed)
Patient's spouse LMOVM stating that patient has been having loose, watery diarrhea all night long, is weak and tired, unable to sleep and requests to be seen by Dr. Welton Flakes today.  Reviewed with MD.  Sherron Monday to spouse and informed them of appointment today at 1030 for labwork and 1100 to see Dr. Welton Flakes.  Spouse confirmed times of appointment

## 2012-05-23 NOTE — Telephone Encounter (Signed)
MD REQUESTING FLUIDS TODAY AND TOMORROW PATIENT REQUESTED TO HAVE FLUIDS TOMORROW AT AROUND 10:30 OR 11:00AM IF POSSIBLE      PATIENT HAS NO PAGER   SENT [name] EMAIL TO SET UP FLUIDS FOR THE PATIENT

## 2012-05-23 NOTE — Patient Instructions (Addendum)
Dehydration, Adult Dehydration is when you lose more fluids from the body than you take in. Vital organs like the kidneys, brain, and heart cannot function without a proper amount of fluids and salt. Any loss of fluids from the body can cause dehydration.  CAUSES   Vomiting.   Diarrhea.   Excessive sweating.   Excessive urine output.   Fever.  SYMPTOMS  Mild dehydration  Thirst.   Dry lips.   Slightly dry mouth.  Moderate dehydration  Very dry mouth.   Sunken eyes.   Skin does not bounce back quickly when lightly pinched and released.   Dark urine and decreased urine production.   Decreased tear production.   Headache.  Severe dehydration  Very dry mouth.   Extreme thirst.   Rapid, weak pulse (more than 100 beats per minute at rest).   Cold hands and feet.   Not able to sweat in spite of heat and temperature.   Rapid breathing.   Blue lips.   Confusion and lethargy.   Difficulty being awakened.   Minimal urine production.   No tears.  DIAGNOSIS  Your caregiver will diagnose dehydration based on your symptoms and your exam. Blood and urine tests will help confirm the diagnosis. The diagnostic evaluation should also identify the cause of dehydration. TREATMENT  Treatment of mild or moderate dehydration can often be done at home by increasing the amount of fluids that you drink. It is best to drink small amounts of fluid more often. Drinking too much at one time can make vomiting worse. Refer to the home care instructions below. Severe dehydration needs to be treated at the hospital where you will probably be given intravenous (IV) fluids that contain water and electrolytes. HOME CARE INSTRUCTIONS   Ask your caregiver about specific rehydration instructions.   Drink enough fluids to keep your urine clear or pale yellow.   Drink small amounts frequently if you have nausea and vomiting.   Eat as you normally do.   Avoid:   Foods or drinks high in  sugar.   Carbonated drinks.   Juice.   Extremely hot or cold fluids.   Drinks with caffeine.   Fatty, greasy foods.   Alcohol.   Tobacco.   Overeating.   Gelatin desserts.   Wash your hands well to avoid spreading bacteria and viruses.   Only take over-the-counter or prescription medicines for pain, discomfort, or fever as directed by your caregiver.   Ask your caregiver if you should continue all prescribed and over-the-counter medicines.   Keep all follow-up appointments with your caregiver.  SEEK MEDICAL CARE IF:  You have abdominal pain and it increases or stays in one area (localizes).   You have a rash, stiff neck, or severe headache.   You are irritable, sleepy, or difficult to awaken.   You are weak, dizzy, or extremely thirsty.  SEEK IMMEDIATE MEDICAL CARE IF:   You are unable to keep fluids down or you get worse despite treatment.   You have frequent episodes of vomiting or diarrhea.   You have blood or green matter (bile) in your vomit.   You have blood in your stool or your stool looks black and tarry.   You have not urinated in 6 to 8 hours, or you have only urinated a small amount of very dark urine.   You have a fever.   You faint.  MAKE SURE YOU:   Understand these instructions.   Will watch your condition.     Will get help right away if you are not doing well or get worse.  Document Released: 09/11/2005 Document Revised: 08/31/2011 Document Reviewed: 05/01/2011 [employer] Patient Information 2012 [employer]. 

## 2012-05-24 ENCOUNTER — Ambulatory Visit (HOSPITAL_BASED_OUTPATIENT_CLINIC_OR_DEPARTMENT_OTHER): Payer: BC Managed Care – PPO

## 2012-05-24 VITALS — BP 104/68 | HR 62 | Temp 97.9°F | Resp 18

## 2012-05-24 DIAGNOSIS — R197 Diarrhea, unspecified: Secondary | ICD-10-CM

## 2012-05-24 DIAGNOSIS — E860 Dehydration: Secondary | ICD-10-CM

## 2012-05-24 DIAGNOSIS — C20 Malignant neoplasm of rectum: Secondary | ICD-10-CM

## 2012-05-24 MED ORDER — SODIUM CHLORIDE 0.9 % IV SOLN
Freq: Once | INTRAVENOUS | Status: AC
  Administered 2012-05-24: 12:00:00 via INTRAVENOUS

## 2012-05-24 NOTE — Patient Instructions (Signed)
[facility] Discharge Instructions for Patients Receiving Chemotherapy  Today you received the following: IV fluids- normal saline   BELOW ARE SYMPTOMS THAT SHOULD BE REPORTED IMMEDIATELY:  *FEVER GREATER THAN 100.5 F  *CHILLS WITH OR WITHOUT FEVER  NAUSEA AND VOMITING THAT IS NOT CONTROLLED WITH YOUR NAUSEA MEDICATION  *UNUSUAL SHORTNESS OF BREATH  *UNUSUAL BRUISING OR BLEEDING  TENDERNESS IN MOUTH AND THROAT WITH OR WITHOUT PRESENCE OF ULCERS  *URINARY PROBLEMS  *BOWEL PROBLEMS  UNUSUAL RASH Items with * indicate a potential emergency and should be followed up as soon as possible.   Feel free to call the clinic you have any questions or concerns. The clinic phone number is [phone].

## 2012-05-28 ENCOUNTER — Ambulatory Visit: Payer: BC Managed Care – PPO

## 2012-06-04 ENCOUNTER — Encounter: Payer: Self-pay | Admitting: *Deleted

## 2012-06-04 ENCOUNTER — Telehealth: Payer: Self-pay | Admitting: *Deleted

## 2012-06-04 ENCOUNTER — Other Ambulatory Visit (HOSPITAL_BASED_OUTPATIENT_CLINIC_OR_DEPARTMENT_OTHER): Payer: BC Managed Care – PPO | Admitting: Lab

## 2012-06-04 ENCOUNTER — Encounter: Payer: Self-pay | Admitting: Oncology

## 2012-06-04 ENCOUNTER — Ambulatory Visit (HOSPITAL_BASED_OUTPATIENT_CLINIC_OR_DEPARTMENT_OTHER): Payer: BC Managed Care – PPO | Admitting: Oncology

## 2012-06-04 ENCOUNTER — Ambulatory Visit (HOSPITAL_BASED_OUTPATIENT_CLINIC_OR_DEPARTMENT_OTHER): Payer: BC Managed Care – PPO

## 2012-06-04 VITALS — BP 129/86 | HR 64 | Temp 98.6°F | Resp 20 | Ht <= 58 in | Wt 83.7 lb

## 2012-06-04 DIAGNOSIS — E860 Dehydration: Secondary | ICD-10-CM

## 2012-06-04 DIAGNOSIS — C20 Malignant neoplasm of rectum: Secondary | ICD-10-CM

## 2012-06-04 DIAGNOSIS — Z5111 Encounter for antineoplastic chemotherapy: Secondary | ICD-10-CM

## 2012-06-04 LAB — COMPREHENSIVE METABOLIC PANEL (CC13)
ALT: 18 U/L (ref 0–55)
AST: 26 U/L (ref 5–34)
Alkaline Phosphatase: 92 U/L (ref 40–150)
Sodium: 140 mEq/L (ref 136–145)
Total Bilirubin: 0.7 mg/dL (ref 0.20–1.20)
Total Protein: 6.7 g/dL (ref 6.4–8.3)

## 2012-06-04 LAB — CBC WITH DIFFERENTIAL/PLATELET
Basophils Absolute: 0 10*3/uL (ref 0.0–0.1)
Eosinophils Absolute: 0.1 10*3/uL (ref 0.0–0.5)
HCT: 30.4 % — ABNORMAL LOW (ref 34.8–46.6)
LYMPH%: 29.7 % (ref 14.0–49.7)
MCV: 86.9 fL (ref 79.5–101.0)
MONO#: 0.4 10*3/uL (ref 0.1–0.9)
MONO%: 13.6 % (ref 0.0–14.0)
NEUT#: 1.5 10*3/uL (ref 1.5–6.5)
NEUT%: 53.3 % (ref 38.4–76.8)
Platelets: 175 10*3/uL (ref 145–400)
WBC: 2.9 10*3/uL — ABNORMAL LOW (ref 3.9–10.3)

## 2012-06-04 MED ORDER — IRINOTECAN HCL CHEMO INJECTION 100 MG/5ML
180.0000 mg/m2 | Freq: Once | INTRAVENOUS | Status: AC
  Administered 2012-06-04: 222 mg via INTRAVENOUS
  Filled 2012-06-04: qty 11.1

## 2012-06-04 MED ORDER — SODIUM CHLORIDE 0.9 % IV SOLN
Freq: Once | INTRAVENOUS | Status: AC
  Administered 2012-06-04: 11:00:00 via INTRAVENOUS

## 2012-06-04 MED ORDER — ONDANSETRON 16 MG/50ML IVPB (CHCC)
16.0000 mg | Freq: Once | INTRAVENOUS | Status: AC
  Administered 2012-06-04: 16 mg via INTRAVENOUS

## 2012-06-04 MED ORDER — DEXAMETHASONE 4 MG PO TABS: ORAL_TABLET | ORAL | Status: DC

## 2012-06-04 MED ORDER — ONDANSETRON HCL 8 MG PO TABS: ORAL_TABLET | ORAL | Status: DC

## 2012-06-04 MED ORDER — FLUOROURACIL CHEMO INJECTION 500 MG/10ML
400.0000 mg/m2 | Freq: Once | INTRAVENOUS | Status: AC
  Administered 2012-06-04: 500 mg via INTRAVENOUS
  Filled 2012-06-04: qty 10

## 2012-06-04 MED ORDER — LORAZEPAM 0.5 MG PO TABS: 0.5000 mg | ORAL_TABLET | Freq: Four times a day (QID) | ORAL | Status: DC | PRN

## 2012-06-04 MED ORDER — PROCHLORPERAZINE 25 MG RE SUPP: 25.0000 mg | Freq: Two times a day (BID) | RECTAL | Status: DC | PRN

## 2012-06-04 MED ORDER — SODIUM CHLORIDE 0.9 % IV SOLN
2400.0000 mg/m2 | INTRAVENOUS | Status: DC
  Administered 2012-06-04: 2950 mg via INTRAVENOUS
  Filled 2012-06-04: qty 59

## 2012-06-04 MED ORDER — LEUCOVORIN CALCIUM INJECTION 350 MG
400.0000 mg/m2 | Freq: Once | INTRAVENOUS | Status: AC
  Administered 2012-06-04: 492 mg via INTRAVENOUS
  Filled 2012-06-04: qty 24.6

## 2012-06-04 MED ORDER — DEXAMETHASONE SODIUM PHOSPHATE 4 MG/ML IJ SOLN
20.0000 mg | Freq: Once | INTRAMUSCULAR | Status: AC
  Administered 2012-06-04: 20 mg via INTRAVENOUS

## 2012-06-04 MED ORDER — LOPERAMIDE HCL 2 MG PO TABS: ORAL_TABLET | ORAL | Status: DC

## 2012-06-04 MED ORDER — PROCHLORPERAZINE MALEATE 10 MG PO TABS: 10.0000 mg | ORAL_TABLET | Freq: Four times a day (QID) | ORAL | Status: DC | PRN

## 2012-06-04 NOTE — Patient Instructions (Signed)
[facility] Discharge Instructions for Patients Receiving Chemotherapy  Today you received the following chemotherapy agents 5 FU/Leucovorin/Irinotecan To help prevent nausea and vomiting after your treatment, we encourage you to take your nausea medication as prescribed.  If you develop nausea and vomiting that is not controlled by your nausea medication, call the clinic. If it is after clinic hours your family physician or the after hours number for the clinic or go to the Emergency Department.   BELOW ARE SYMPTOMS THAT SHOULD BE REPORTED IMMEDIATELY:  *FEVER GREATER THAN 100.5 F  *CHILLS WITH OR WITHOUT FEVER  NAUSEA AND VOMITING THAT IS NOT CONTROLLED WITH YOUR NAUSEA MEDICATION  *UNUSUAL SHORTNESS OF BREATH  *UNUSUAL BRUISING OR BLEEDING  TENDERNESS IN MOUTH AND THROAT WITH OR WITHOUT PRESENCE OF ULCERS  *URINARY PROBLEMS  *BOWEL PROBLEMS  UNUSUAL RASH Items with * indicate a potential emergency and should be followed up as soon as possible.  One of the nurses will contact you 24 hours after your treatment. Please let the nurse know about any problems that you may have experienced. Feel free to call the clinic you have any questions or concerns. The clinic phone number is [phone].   I have been informed and understand all the instructions given to me. I know to contact the clinic, my physician, or go to the Emergency Department if any problems should occur. I do not have any questions at this time, but understand that I may call the clinic during office hours   should I have any questions or need assistance in obtaining follow up care.    __________________________________________  _____________  __________ Signature of Patient or Authorized Representative            Date                   Time    __________________________________________ Nurse's Signature    

## 2012-06-04 NOTE — Progress Notes (Signed)
OFFICE PROGRESS NOTE  CC: [name], MD [name], MD [name], MD [name], MD [address] 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  PRIOR THERAPY:   #1 patient presented with a several month history of diarrhea and then subsequent bleeding. She was found to be anemic with a hemoglobin of 10. Because of this she went on to have a colonoscopy performed. The colonoscopy showed multiple sessile polyps in the cecum ascending transverse and sigmoid colon as well as the rectum. The rectal area showed a large exophytic rectal mass approximately 1 cm above the dentate line measuring 3-4 cm. She had a biopsy of this performed and was found to be an adenocarcinoma consistent with a rectal primary.  #2 patient was seen by Dr. [name] at [facility] who performed a rectal ultrasound. The staging clinically was T3 N1. It was recommended by Dr. [name] the patient undergo neoadjuvant chemotherapy and radiation higher to her definitive surgery.  #3 Patient has completed concurrent radiation and chemotherapy. Her chemotherapy consisted of single agent xeloda administrated 08/22/11 - 10/09/2011. Overall she tolerated her treatment very well  #4 Has completed all of neoadjuvant treatment And the PET scan shows no evidence of distant metastasis and a para rectal peritoneal nodes less impressive she has had significant response to therapy.  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 APC is negative  #6 patient had a laparotomy with proctocolectomy, J-pouch with ileoanal anastomosis, loop ileostomy on 12/14/2011. The final pathology did not reveal any evidence of residual disease. Patient had a  complicated postop course including high output ileostomy. She required intensive IV fluids on an outpatient basis to prevent dehydration.  #7 patient began adjuvant chemotherapy consisting of XELOX starting 03/05/2012 on a 21 day cycle. A Total of 6 cycles were planned unfortunate patient was not able to tolerate this therapy due to side effects from the xeloda With development of hand-foot syndrome.She was not able to tolerate oxaliplatin due to significant toxicity from cold sensitivity. In that this treatment is being discontinued.  CURRENT THERAPY: Cycle 1 of FOLFIRI adjuvantly  INTERVAL HISTORY: Jayana Carvin 46 y.o. female returns for workin visit today.She is seen today with her husband and interpreter. Overall she is doing well she has gained 1 pound. She is scheduled to start FOLFIRI adjuvantly. Today will be cycle 1. She denies any fevers chills night sweats headaches shortness of breath chest pains palpitations she does have diarrhea off and on but she is taking liquid Imodium for this with good control. She denies any abdominal pain no bleeding. No peripheral paresthesias and no cold sensitivity Remainder of the 10 point review of systems is negative. MEDICAL HISTORY: Past Medical History  Diagnosis Date  . Anemia   . Diarrhea   . Rectal cancer 08/16/2011  . Colon cancer   . Anxiety   . Bright red rectal bleeding 09/13/2011  . History of chemotherapy     completed 09/2011   . Hx of radiation therapy 08/29/11 to 10/09/11    rectum    ALLERGIES:   has no known allergies.  MEDICATIONS:  Current Outpatient Prescriptions  Medication Sig Dispense Refill  . acetaminophen (TYLENOL) 325 MG tablet Take 2 tablets (650  mg total) by mouth every 4 (four) hours as needed.  200 tablet  1  . feeding supplement (ENSURE COMPLETE) LIQD Take 237 mLs by mouth 3 (three) times daily between meals.  50 Bottle  6  . feeding supplement (RESOURCE BREEZE) LIQD Take 1 Container by mouth 3 (three) times  daily between meals.  30 Container  6  . Ferrous Sulfate (IRON) 28 MG TABS Take 1 tablet by mouth daily.      . Heparin Lock Flush (HEPARIN FLUSH, PORCINE,) 100 UNIT/ML injection 1 mL (100 Units total) by Intracatheter route as needed (250 units per lumen).  5 Syringe  3  . HYDROcodone-acetaminophen (VICODIN) 5-500 MG per tablet Take 1-2 tablets by mouth every 6 (six) hours as needed for pain.  30 tablet  0  . lidocaine-prilocaine (EMLA) cream Apply topically as needed.  30 g  6  . loperamide (IMODIUM) 2 MG capsule Take 2 capsules by mouth Every 4 hours as needed.      . megestrol (MEGACE) 400 MG/10ML suspension Take 10 mLs (400 mg total) by mouth 2 (two) times daily.  240 mL  1  . Multiple Vitamin (MULTIVITAMIN) tablet Take 1 tablet by mouth every morning.       . pantoprazole (PROTONIX) 40 MG tablet Take 40 mg by mouth daily.      . polysaccharide iron (NIFEREX) 150 MG CAPS capsule Take 150 mg by mouth every morning.       . potassium chloride SA (K-DUR,KLOR-CON) 20 MEQ tablet Take 1 tablet (20 mEq total) by mouth daily.  5 tablet  0  . simethicone (MYLICON) 80 MG chewable tablet Chew 80 mg by mouth every 6 (six) hours as needed. For gas       No current facility-administered medications for this visit.   Facility-Administered Medications Ordered in Other Visits  Medication Dose Route Frequency Provider Last Rate Last Dose  . heparin lock flush 100 unit/mL  500 Units Intravenous Once Victorino December, MD      . sodium chloride 0.9 % injection 10 mL  10 mL Intravenous PRN Victorino December, MD        SURGICAL HISTORY:  Past Surgical History  Procedure Date  . Tubal ligation   . Carpal tunnel release   . Colonoscopy 07/26/2011    Procedure: COLONOSCOPY;  Surgeon: Arlyce Harman, MD;  Location: [facility];  Service: Endoscopy;  Laterality: N/A;  10:40  . Esophagogastroduodenoscopy 07/26/2011    Procedure: ESOPHAGOGASTRODUODENOSCOPY (EGD);  Surgeon: Arlyce Harman, MD;  Location: [facility];  Service: Endoscopy;  Laterality: N/A;  . Esophagogastroduodenoscopy 12/27/2011    Procedure: ESOPHAGOGASTRODUODENOSCOPY (EGD);  Surgeon: Shirley Friar, MD;  Location: [facility];  Service: Endoscopy;  Laterality: N/A;  . Laparoscopic colon resection 12/14/11    diverting ileostomy  . Portacath placement 02/21/2012    Procedure: INSERTION PORT-A-CATH;  Surgeon: [name], MD;  Location: [facility] OR;  Service: General;  Laterality: N/A;    REVIEW OF SYSTEMS:  Pertinent items are noted in HPI.   PHYSICAL EXAMINATION:  otomy site clear no evidence of bleeding  ECOG PERFORMANCE STATUS: 0 - Asymptomatic  Blood pressure 129/86, pulse 64, temperature 98.6 F (37 C), temperature source Oral, resp. rate 20, height 4\' 9"  (1.448 m), weight 83 lb 11.2 oz (37.966 kg), last menstrual period 02/20/2012.  LABORATORY DATA: Lab Results  Component Value Date   WBC 2.9* 06/04/2012   HGB 10.6* 06/04/2012   HCT 30.4* 06/04/2012  MCV 86.9 06/04/2012   PLT 175 06/04/2012      Chemistry      Component Value Date/Time   NA 138 05/23/2012 1115   NA 139 05/06/2012 1316   K 3.8 05/23/2012 1115   K 3.3* 05/06/2012 1316   CL 101 05/23/2012 1115   CL 104 05/06/2012 1316   CO2 27 05/23/2012 1115   CO2 27 05/06/2012 1316   BUN 13.0 05/23/2012 1115   BUN 8 05/06/2012 1316   CREATININE 0.8 05/23/2012 1115   CREATININE 0.70 05/06/2012 1316   CREATININE 0.70 07/27/2011 1306      Component Value Date/Time   CALCIUM 9.3 05/23/2012 1115   CALCIUM 9.0 05/06/2012 1316   ALKPHOS 103 05/23/2012 1115   ALKPHOS 81 05/06/2012 1316   AST 32 05/23/2012 1115   AST 55* 05/06/2012 1316   ALT 23 05/23/2012 1115   ALT 44* 05/06/2012 1316   BILITOT 0.60 05/23/2012 1115   BILITOT 0.7 05/06/2012 1316     ADDITIONAL INFORMATION: 1. Following placement in clearing solution, three more lymph nodes were identified, all of which are benign. Thus, the total lymph node count is 11. (JK:mw 12-18-11) 2. Following an exhaustive search,  additional tissue has been submitted in an attempt to recover lymph nodes. Two more lymph nodes were identified, both benign. This brings the total number of lymph nodes recovered to 13, all of which are benign. (JBK:eps 12/26/11) FINAL DIAGNOSIS Diagnosis 1. Colon, resection margin (donut), distal rectal - COLORECTAL MUCOSA WITH FIBROSIS AND EDEMA, CONSISTENT WITH TREATMENT. - THERE IS NO EVIDENCE OF MALIGNANCY. 2. Colon, total resection (incl lymph nodes) - COLON WITH SCATTERED TUBULAR ADENOMAS. - HIGH GRADE DYSPLASIA IS NOT IDENTIFIED. - BENIGN APPENDIX WITH FIBROUS OBLITERATION OF THE TIP. - THERE IS NO EVIDENCE OF CARCINOMA IN 8 OF 8 LYMPH NODES (0/8). - SEE ONCOLOGY TABLE BELOW. 3. Colon, resection margin (donut), new distal margin and rectum - BENIGN SQUAMOUS LINED MUCOSA. - THERE IS NO EVIDENCE OF MALIGNANCY. Microscopic Comment 2. COLON AND RECTUM Specimen: Colon, appendix, and terminal ileum. Procedure: Total colectomy. Tumor site: N/A Specimen integrity: Intact. Macroscopic intactness of mesorectum: Complete. Macroscopic tumor perforation: N/A Invasive tumor: N/A 1 of 3 FINAL for CYRA, PINCOCK ([id number]) Microscopic Comment(continued) Microscopic extension of invasive tumor: N/A Lymph-Vascular invasion: N/A Peri-neural invasion: N/A Tumor deposit(s) (discontinuous extramural extension): N/A Resection margins: Negative for adenocarcinoma. Treatment effect (neoadjuvant therapy): Present, significant. Number of lymph nodes examined- 8 ; Number positive - 0 (PLEASE NOTE: Additional tissue will be submitted in an attempt to recover more lymph nodes) Additional polyp(s): Multiple scattered tubular adenomas. Pathologic Staging: ypT0, ypN0 Ancillary studies: N/A Comments: Grossly, there is a 1.5 cm granular depressed area of mucosa present at the distal portion of the specimen. This entire area has been submitted for histologic evaluation revealing inflamed, fibrotic,  and edematous colorectal mucosa, consistent with treatment effect. Adenocarcinoma is not identified. In addition, there are scattered tubular adenomas throughout the specimen. No high grade dysplasia is identified. The case was discussed with Dr. Donell Beers on 12/15/2011. (JBK:gt, 12/15/11) Pecola Leisure MD Pathologist, Electronic Signature (Case signed 12/15/2011) Intraoper  RADIOGRAPHIC STUDIES:    ASSESSMENT: 46 year old female with:  #1. stage III adenocarcinoma of the rectum. Patient is now status post definitive surgery after having received neoadjuvant chemotherapy and radiation therapy. Her final pathology does not reveal any evidence of residual disease. 8 nodes were negative for metastatic disease.  #2 . Patient was begun on adjuvant chemotherapy consisting of Xeloda and  tape side of being. She does far has received one cycle. This course was complicated by development of a severe dehydration requiring hospitalization.  #3 Patient and I discussed rationale doing FOLFIRI. Risks and benefits of both 5-FU leucovorin and irinotecan were discussed with the patient. She understands literature was given to her.  A total of 4 cycles will be planned.   PLAN: #1 Patient will start fall teary today. Risks and benefits of this were discussed with her and her family today.  #2 patient does have history of neutropenia I do think it is important that we preemptively start her on Neulasta and I have set her up for this on day when she comes in to have a pump DC'd.  #3 since patient does develop dehydration and diarrhea I went over extensively how to use the Imodium. I have also set her up to receive IV fluids on the day of pump DC as well as op on Friday Saturday Monday Tuesday of next week.  #4 I will plan on seeing the patient back on Tuesday, September 17. Of course she knows to call me with any problems questions or concerns and I will be happy to see her sooner. All questions were answered. The  patient knows to call the clinic with any problems, questions or concerns. We can certainly see the patient much sooner if necessary.  I spent >25 minutes counseling the patient face to face. The total time spent in the appointment was 30 minutes.    Drue Second, MD Medical/Oncology [facility] [phone] (beeper) [phone] (Office)  06/04/2012, 9:57 AM

## 2012-06-04 NOTE — Patient Instructions (Addendum)
Proceed with chemotherapy today  Take anti-emetics as prescribed.  Take imodium as instructed on the prescription bottle  You will return on 9/12 for pump discontinue, neulasta injection and IVF  You will receive IVF on 9/14, 9/16, 9/17  I will see you back on 9/17 with blood work

## 2012-06-04 NOTE — Telephone Encounter (Signed)
IVF on 9/112, 9/14, 9/16, 9/17   Made patient appointment for 06-11-2012 starting at 9:45am with labs  Sent michelle an email to set up patient for fluids on the above dates  Patient is aware of the appointments

## 2012-06-05 ENCOUNTER — Other Ambulatory Visit: Payer: Self-pay | Admitting: Medical Oncology

## 2012-06-05 ENCOUNTER — Telehealth: Payer: Self-pay | Admitting: *Deleted

## 2012-06-05 ENCOUNTER — Encounter: Payer: Self-pay | Admitting: Medical Oncology

## 2012-06-05 ENCOUNTER — Ambulatory Visit (HOSPITAL_BASED_OUTPATIENT_CLINIC_OR_DEPARTMENT_OTHER): Payer: BC Managed Care – PPO

## 2012-06-05 ENCOUNTER — Telehealth: Payer: Self-pay | Admitting: Medical Oncology

## 2012-06-05 VITALS — BP 127/78 | HR 70 | Temp 98.0°F

## 2012-06-05 DIAGNOSIS — C20 Malignant neoplasm of rectum: Secondary | ICD-10-CM

## 2012-06-05 DIAGNOSIS — E860 Dehydration: Secondary | ICD-10-CM

## 2012-06-05 DIAGNOSIS — R197 Diarrhea, unspecified: Secondary | ICD-10-CM

## 2012-06-05 MED ORDER — SODIUM CHLORIDE 0.9 % IV SOLN
Freq: Once | INTRAVENOUS | Status: AC
  Administered 2012-06-05: 12:00:00 via INTRAVENOUS

## 2012-06-05 MED ORDER — ONDANSETRON 16 MG/50ML IVPB (CHCC)
16.0000 mg | Freq: Once | INTRAVENOUS | Status: AC
  Administered 2012-06-05: 16 mg via INTRAVENOUS

## 2012-06-05 NOTE — Telephone Encounter (Signed)
Sent michelle email to get patient's fluids scheduled for 06-07-2012

## 2012-06-05 NOTE — Telephone Encounter (Signed)
Patients spouse called to inform office that patient has thrown up 3 times last night, she is not eating, and her anti-nausea medicine is not working.  Reviewed with MD.    Informed patient's spouse that patient to arrive at 1130 to receive IVF.  Spouse confirmed instructions.  Informed charge nurse that patient would need PIV d/t having 5-FU pump infusing.

## 2012-06-06 ENCOUNTER — Ambulatory Visit (HOSPITAL_BASED_OUTPATIENT_CLINIC_OR_DEPARTMENT_OTHER): Payer: BC Managed Care – PPO

## 2012-06-06 ENCOUNTER — Other Ambulatory Visit: Payer: BC Managed Care – PPO | Admitting: Lab

## 2012-06-06 VITALS — BP 145/77 | HR 61 | Temp 97.8°F

## 2012-06-06 DIAGNOSIS — E860 Dehydration: Secondary | ICD-10-CM

## 2012-06-06 DIAGNOSIS — Z5189 Encounter for other specified aftercare: Secondary | ICD-10-CM

## 2012-06-06 DIAGNOSIS — C20 Malignant neoplasm of rectum: Secondary | ICD-10-CM

## 2012-06-06 MED ORDER — SODIUM CHLORIDE 0.9 % IV SOLN
INTRAVENOUS | Status: DC
  Administered 2012-06-06: 14:00:00 via INTRAVENOUS

## 2012-06-06 MED ORDER — SODIUM CHLORIDE 0.9 % IJ SOLN
10.0000 mL | INTRAMUSCULAR | Status: DC | PRN
  Administered 2012-06-06: 10 mL
  Filled 2012-06-06: qty 10

## 2012-06-06 MED ORDER — PEGFILGRASTIM INJECTION 6 MG/0.6ML
6.0000 mg | Freq: Once | SUBCUTANEOUS | Status: AC
  Administered 2012-06-06: 6 mg via SUBCUTANEOUS
  Filled 2012-06-06: qty 0.6

## 2012-06-06 MED ORDER — HEPARIN SOD (PORK) LOCK FLUSH 100 UNIT/ML IV SOLN
500.0000 [IU] | Freq: Once | INTRAVENOUS | Status: AC | PRN
  Administered 2012-06-06: 500 [IU]
  Filled 2012-06-06: qty 5

## 2012-06-08 ENCOUNTER — Ambulatory Visit (HOSPITAL_BASED_OUTPATIENT_CLINIC_OR_DEPARTMENT_OTHER): Payer: BC Managed Care – PPO

## 2012-06-08 VITALS — BP 122/81 | HR 70 | Temp 97.4°F | Resp 20

## 2012-06-08 DIAGNOSIS — E860 Dehydration: Secondary | ICD-10-CM

## 2012-06-08 DIAGNOSIS — C20 Malignant neoplasm of rectum: Secondary | ICD-10-CM

## 2012-06-08 MED ORDER — SODIUM CHLORIDE 0.9 % IV SOLN
INTRAVENOUS | Status: DC
  Administered 2012-06-08: 11:00:00 via INTRAVENOUS

## 2012-06-08 MED ORDER — SODIUM CHLORIDE 0.9 % IV SOLN: INTRAVENOUS | Status: DC

## 2012-06-08 NOTE — Patient Instructions (Signed)
Dehydration, Adult Dehydration means your body does not have as much fluid as it needs. Your kidneys, brain, and heart will not work properly without the right amount of fluids and salt.  HOME CARE  Ask your doctor how to replace body fluid losses (rehydrate).   Drink enough fluids to keep your pee (urine) clear or pale yellow.   Drink small amounts of fluids often if you feel sick to your stomach (nauseous) or throw up (vomit).   Eat like you normally do.   Avoid:   Foods or drinks high in sugar.   Bubbly (carbonated) drinks.   Juice.   Very hot or cold fluids.   Drinks with caffeine.   Fatty, greasy foods.   Alcohol.   Tobacco.   Eating too much.   Gelatin desserts.   Wash your hands to avoid spreading germs (bacteria, viruses).   Only take medicine as told by your doctor.   Keep all doctor visits as told.  GET HELP RIGHT AWAY IF:   You cannot drink something without throwing up.   You get worse even with treatment.   Your vomit has blood in it or looks greenish.   Your poop (stool) has blood in it or looks black and tarry.   You have not peed in 6 to 8 hours.   You pee a small amount of very dark pee.   You have a fever.   You pass out (faint).   You have belly (abdominal) pain that gets worse or stays in one spot (localizes).   You have a rash, stiff neck, or bad headache.   You get easily annoyed, sleepy, or are hard to wake up.   You feel weak, dizzy, or very thirsty.  MAKE SURE YOU:   Understand these instructions.   Will watch your condition.   Will get help right away if you are not doing well or get worse.  Document Released: 07/08/2009 Document Revised: 08/31/2011 Document Reviewed: 05/01/2011 [employer] Patient Information 2012 [employer]. 

## 2012-06-10 ENCOUNTER — Ambulatory Visit (HOSPITAL_BASED_OUTPATIENT_CLINIC_OR_DEPARTMENT_OTHER): Payer: BC Managed Care – PPO

## 2012-06-10 VITALS — BP 103/63 | HR 71 | Temp 98.0°F | Resp 20

## 2012-06-10 DIAGNOSIS — E860 Dehydration: Secondary | ICD-10-CM

## 2012-06-10 MED ORDER — SODIUM CHLORIDE 0.9 % IV SOLN
INTRAVENOUS | Status: AC
  Administered 2012-06-10: 10:00:00 via INTRAVENOUS

## 2012-06-10 NOTE — Patient Instructions (Signed)
[facility] Discharge Instructions for Patients Receiving Chemotherapy  Today you received the following Normal Saline  To help prevent nausea and vomiting after your treatment, we encourage you to take your nausea medication Begin taking it at 7 pm and take it as often as prescribed for the next 24 to 72 hours.   If you develop nausea and vomiting that is not controlled by your nausea medication, call the clinic. If it is after clinic hours your family physician or the after hours number for the clinic or go to the Emergency Department.   BELOW ARE SYMPTOMS THAT SHOULD BE REPORTED IMMEDIATELY:  *FEVER GREATER THAN 100.5 F  *CHILLS WITH OR WITHOUT FEVER  NAUSEA AND VOMITING THAT IS NOT CONTROLLED WITH YOUR NAUSEA MEDICATION  *UNUSUAL SHORTNESS OF BREATH  *UNUSUAL BRUISING OR BLEEDING  TENDERNESS IN MOUTH AND THROAT WITH OR WITHOUT PRESENCE OF ULCERS  *URINARY PROBLEMS  *BOWEL PROBLEMS  UNUSUAL RASH Items with * indicate a potential emergency and should be followed up as soon as possible.  One of the nurses will contact you 24 hours after your treatment. Please let the nurse know about any problems that you may have experienced. Feel free to call the clinic you have any questions or concerns. The clinic phone number is [phone].   I have been informed and understand all the instructions given to me. I know to contact the clinic, my physician, or go to the Emergency Department if any problems should occur. I do not have any questions at this time, but understand that I may call the clinic during office hours   should I have any questions or need assistance in obtaining follow up care.    __________________________________________  _____________  __________ Signature of Patient or Authorized Representative            Date                   Time    __________________________________________ Nurse's Signature    

## 2012-06-11 ENCOUNTER — Encounter: Payer: Self-pay | Admitting: Oncology

## 2012-06-11 ENCOUNTER — Other Ambulatory Visit (HOSPITAL_BASED_OUTPATIENT_CLINIC_OR_DEPARTMENT_OTHER): Payer: BC Managed Care – PPO | Admitting: Lab

## 2012-06-11 ENCOUNTER — Ambulatory Visit (HOSPITAL_BASED_OUTPATIENT_CLINIC_OR_DEPARTMENT_OTHER): Payer: BC Managed Care – PPO | Admitting: Oncology

## 2012-06-11 ENCOUNTER — Ambulatory Visit (HOSPITAL_BASED_OUTPATIENT_CLINIC_OR_DEPARTMENT_OTHER): Payer: BC Managed Care – PPO

## 2012-06-11 VITALS — BP 94/55 | HR 71 | Temp 97.9°F | Resp 20 | Ht <= 58 in | Wt 81.3 lb

## 2012-06-11 DIAGNOSIS — C20 Malignant neoplasm of rectum: Secondary | ICD-10-CM

## 2012-06-11 DIAGNOSIS — R197 Diarrhea, unspecified: Secondary | ICD-10-CM

## 2012-06-11 DIAGNOSIS — E860 Dehydration: Secondary | ICD-10-CM

## 2012-06-11 LAB — CBC WITH DIFFERENTIAL/PLATELET
EOS%: 1.4 % (ref 0.0–7.0)
HCT: 31 % — ABNORMAL LOW (ref 34.8–46.6)
MCH: 30.5 pg (ref 25.1–34.0)
MCHC: 35.2 g/dL (ref 31.5–36.0)
NEUT%: 77.7 % — ABNORMAL HIGH (ref 38.4–76.8)
RBC: 3.57 10*6/uL — ABNORMAL LOW (ref 3.70–5.45)
lymph#: 1.6 10*3/uL (ref 0.9–3.3)

## 2012-06-11 LAB — COMPREHENSIVE METABOLIC PANEL (CC13)
ALT: 15 U/L (ref 0–55)
CO2: 27 mEq/L (ref 22–29)
Calcium: 8.9 mg/dL (ref 8.4–10.4)
Chloride: 101 mEq/L (ref 98–107)
Glucose: 110 mg/dl — ABNORMAL HIGH (ref 70–99)
Sodium: 139 mEq/L (ref 136–145)
Total Bilirubin: 0.8 mg/dL (ref 0.20–1.20)
Total Protein: 6.3 g/dL — ABNORMAL LOW (ref 6.4–8.3)

## 2012-06-11 MED ORDER — SODIUM CHLORIDE 0.9 % IV SOLN
INTRAVENOUS | Status: DC
  Administered 2012-06-11: 12:00:00 via INTRAVENOUS

## 2012-06-11 NOTE — Patient Instructions (Addendum)
IVF today  Return on 9/24 for cycle 2 of FOLFIRI  You will also get IVF during next week starting on 9/24 - 9/28, 9/30 and 10/1

## 2012-06-11 NOTE — Progress Notes (Signed)
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  PRIOR THERAPY:   #1 patient presented with a several month history of diarrhea and then subsequent bleeding. She was found to be anemic with a hemoglobin of 10. Because of this she went on to have a colonoscopy performed. The colonoscopy showed multiple sessile polyps in the cecum ascending transverse and sigmoid colon as well as the rectum. The rectal area showed a large exophytic rectal mass approximately 1 cm above the dentate line measuring 3-4 cm. She had a biopsy of this performed and was found to be an adenocarcinoma consistent with a rectal primary.  #2 patient was seen by Dr. [name] at [facility] who performed a rectal ultrasound. The staging clinically was T3 N1. It was recommended by Dr. [name] the patient undergo neoadjuvant chemotherapy and radiation higher to her definitive surgery.  #3 Patient has completed concurrent radiation and chemotherapy. Her chemotherapy consisted of single agent xeloda administrated 08/22/11 - 10/09/2011. Overall she tolerated her treatment very well  #4 Has completed all of neoadjuvant treatment And the PET scan shows no evidence of distant metastasis and a para rectal peritoneal nodes less impressive she has had significant response to therapy.  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 APC is negative  #6 patient had a laparotomy with proctocolectomy, J-pouch with ileoanal anastomosis, loop ileostomy on 12/14/2011. The final pathology did not reveal any evidence of residual disease. Patient had a  complicated postop course including high output ileostomy. She required intensive IV fluids on an outpatient basis to prevent dehydration.  #7 patient began adjuvant chemotherapy consisting of XELOX starting 03/05/2012 on a 21 day cycle. A Total of 6 cycles were planned unfortunate patient was not able to tolerate this therapy due to side effects from the xeloda With development of hand-foot syndrome.She was not able to tolerate oxaliplatin due to significant toxicity from cold sensitivity. In that this treatment is being discontinued.  CURRENT THERAPY: Cycle 1 of FOLFIRI adjuvantly  INTERVAL HISTORY: Renee Nicholson 46 y.o. female returns for workin visit today.She is seen today with her husband and interpreter. She has developed diarrhea. She did receive IV fluids last week she will receive them today as well. She occasionally does develop nausea. She is trying to eat. We discussed what type of foods she should be eating to help prevent the diarrhea. She otherwise denies any headaches double vision blurring of vision no fevers chills or night sweats. No bleeding. We will aggressively continue to hydrate her. Remainder of the 10 point review of systems is negative. MEDICAL HISTORY: Past Medical History  Diagnosis Date  . Anemia   . Diarrhea   . Rectal cancer 08/16/2011  . Colon cancer   . Anxiety   . Bright red rectal bleeding 09/13/2011  . History of chemotherapy     completed 09/2011   . Hx of radiation therapy 08/29/11 to 10/09/11    rectum    ALLERGIES:   has no known allergies.  MEDICATIONS:  Current Outpatient Prescriptions  Medication Sig Dispense Refill  . acetaminophen (TYLENOL) 325 MG tablet Take 2 tablets (650  mg total) by mouth every 4 (four) hours as needed.  200 tablet  1  . dexamethasone (DECADRON) 4 MG tablet Take 2 tablets by mouth two times a day starting the day after chemotherapy for 3 days. Take with food.  30 tablet  1  . feeding supplement (ENSURE COMPLETE) LIQD Take  237 mLs by mouth 3 (three) times daily between meals.  50 Bottle  6  . feeding supplement (RESOURCE BREEZE) LIQD Take 1 Container by mouth 3 (three) times daily between meals.  30 Container  6  . Ferrous Sulfate (IRON) 28 MG TABS Take 1 tablet by mouth daily.      . Heparin Lock Flush (HEPARIN FLUSH, PORCINE,) 100 UNIT/ML injection 1 mL (100 Units total) by Intracatheter route as needed (250 units per lumen).  5 Syringe  3  . HYDROcodone-acetaminophen (VICODIN) 5-500 MG per tablet Take 1-2 tablets by mouth every 6 (six) hours as needed for pain.  30 tablet  0  . lidocaine-prilocaine (EMLA) cream Apply topically as needed.  30 g  6  . loperamide (IMODIUM A-D) 2 MG tablet Take 2 tab at onset of diarrhea, then 1 tab q2h until 12 hr have passed without a BM.  May take 2 tab q4h qhs. If diarrhea recurs repeat.  60 tablet  4  . loperamide (IMODIUM) 2 MG capsule Take 2 capsules by mouth Every 4 hours as needed.      Marland Kitchen LORazepam (ATIVAN) 0.5 MG tablet Take 1 tablet (0.5 mg total) by mouth every 6 (six) hours as needed for anxiety.  30 tablet  0  . megestrol (MEGACE) 400 MG/10ML suspension Take 10 mLs (400 mg total) by mouth 2 (two) times daily.  240 mL  1  . Multiple Vitamin (MULTIVITAMIN) tablet Take 1 tablet by mouth every morning.       . ondansetron (ZOFRAN) 8 MG tablet Take 1 tablet two times a day starting the day after chemo for 3 days. Then take 1 tablet two times a day as needed for nausea or vomiting.  30 tablet  1  . pantoprazole (PROTONIX) 40 MG tablet Take 40 mg by mouth daily.      . polysaccharide iron (NIFEREX) 150 MG CAPS capsule Take 150 mg by mouth every morning.       . potassium chloride SA (K-DUR,KLOR-CON) 20 MEQ tablet Take 1 tablet (20 mEq total) by mouth daily.  5 tablet  0  . prochlorperazine (COMPAZINE) 10 MG tablet Take 1 tablet (10 mg total) by mouth every 6 (six) hours as needed (Nausea or vomiting).  30 tablet  1  . prochlorperazine (COMPAZINE) 25 MG suppository Place 1  suppository (25 mg total) rectally every 12 (twelve) hours as needed for nausea.  12 suppository  3  . simethicone (MYLICON) 80 MG chewable tablet Chew 80 mg by mouth every 6 (six) hours as needed. For gas       No current facility-administered medications for this visit.   Facility-Administered Medications Ordered in Other Visits  Medication Dose Route Frequency Provider Last Rate Last Dose  . heparin lock flush 100 unit/mL  500 Units Intravenous Once Victorino December, MD      . sodium chloride 0.9 % injection 10 mL  10 mL Intravenous PRN Victorino December, MD        SURGICAL HISTORY:  Past Surgical History  Procedure Date  . Tubal ligation   . Carpal tunnel release   . Colonoscopy 07/26/2011    Procedure:  COLONOSCOPY;  Surgeon: Arlyce Harman, MD;  Location: [facility];  Service: Endoscopy;  Laterality: N/A;  10:40  . Esophagogastroduodenoscopy 07/26/2011    Procedure: ESOPHAGOGASTRODUODENOSCOPY (EGD);  Surgeon: Arlyce Harman, MD;  Location: [facility];  Service: Endoscopy;  Laterality: N/A;  . Esophagogastroduodenoscopy 12/27/2011    Procedure: ESOPHAGOGASTRODUODENOSCOPY (EGD);  Surgeon: Shirley Friar, MD;  Location: [facility];  Service: Endoscopy;  Laterality: N/A;  . Laparoscopic colon resection 12/14/11    diverting ileostomy  . Portacath placement 02/21/2012    Procedure: INSERTION PORT-A-CATH;  Surgeon: [name], MD;  Location: [facility] OR;  Service: General;  Laterality: N/A;    REVIEW OF SYSTEMS:  Pertinent items are noted in HPI.   PHYSICAL EXAMINATION:   HEENT exam EOMI PERRLA sclerae anicteric no conjunctival pallor oral mucosa is moist neck is supple lungs are clear cardiovascular is regular rate rhythm no murmurs abdomen is soft nontender nondistended bowel sounds are present no HSM extremities no edema neuro is nonfocal ostomy bag with diarrheal stool no blood   ECOG PERFORMANCE STATUS: 0 - Asymptomatic  Blood pressure 94/55, pulse 71, temperature 97.9 F  (36.6 C), temperature source Oral, resp. rate 20, height 4\' 9"  (1.448 m), weight 81 lb 4.8 oz (36.877 kg), last menstrual period 02/20/2012.  LABORATORY DATA: Lab Results  Component Value Date   WBC 17.6* 06/11/2012   HGB 10.9* 06/11/2012   HCT 31.0* 06/11/2012   MCV 86.8 06/11/2012   PLT 110* 06/11/2012      Chemistry      Component Value Date/Time   NA 140 06/04/2012 0906   NA 139 05/06/2012 1316   K 3.8 06/04/2012 0906   K 3.3* 05/06/2012 1316   CL 105 06/04/2012 0906   CL 104 05/06/2012 1316   CO2 24 06/04/2012 0906   CO2 27 05/06/2012 1316   BUN 12.0 06/04/2012 0906   BUN 8 05/06/2012 1316   CREATININE 0.6 06/04/2012 0906   CREATININE 0.70 05/06/2012 1316   CREATININE 0.70 07/27/2011 1306      Component Value Date/Time   CALCIUM 9.1 06/04/2012 0906   CALCIUM 9.0 05/06/2012 1316   ALKPHOS 92 06/04/2012 0906   ALKPHOS 81 05/06/2012 1316   AST 26 06/04/2012 0906   AST 55* 05/06/2012 1316   ALT 18 06/04/2012 0906   ALT 44* 05/06/2012 1316   BILITOT 0.70 06/04/2012 0906   BILITOT 0.7 05/06/2012 1316     ADDITIONAL INFORMATION: 1. Following placement in clearing solution, three more lymph nodes were identified, all of which are benign. Thus, the total lymph node count is 11. (JK:mw 12-18-11) 2. Following an exhaustive search, additional tissue has been submitted in an attempt to recover lymph nodes. Two more lymph nodes were identified, both benign. This brings the total number of lymph nodes recovered to 13, all of which are benign. (JBK:eps 12/26/11) FINAL DIAGNOSIS Diagnosis 1. Colon, resection margin (donut), distal rectal - COLORECTAL MUCOSA WITH FIBROSIS AND EDEMA, CONSISTENT WITH TREATMENT. - THERE IS NO EVIDENCE OF MALIGNANCY. 2. Colon, total resection (incl lymph nodes) - COLON WITH SCATTERED TUBULAR ADENOMAS. - HIGH GRADE DYSPLASIA IS NOT IDENTIFIED. - BENIGN APPENDIX WITH FIBROUS OBLITERATION OF THE TIP. - THERE IS NO EVIDENCE OF CARCINOMA IN 8 OF 8 LYMPH NODES (0/8). - SEE  ONCOLOGY TABLE BELOW. 3. Colon, resection margin (donut), new distal margin and rectum - BENIGN SQUAMOUS LINED MUCOSA. - THERE IS NO EVIDENCE OF MALIGNANCY. Microscopic Comment 2. COLON AND RECTUM Specimen: Colon, appendix, and terminal  ileum. Procedure: Total colectomy. Tumor site: N/A Specimen integrity: Intact. Macroscopic intactness of mesorectum: Complete. Macroscopic tumor perforation: N/A Invasive tumor: N/A 1 of 3 FINAL for JAMAR, CASAGRANDE ([id number]) Microscopic Comment(continued) Microscopic extension of invasive tumor: N/A Lymph-Vascular invasion: N/A Peri-neural invasion: N/A Tumor deposit(s) (discontinuous extramural extension): N/A Resection margins: Negative for adenocarcinoma. Treatment effect (neoadjuvant therapy): Present, significant. Number of lymph nodes examined- 8 ; Number positive - 0 (PLEASE NOTE: Additional tissue will be submitted in an attempt to recover more lymph nodes) Additional polyp(s): Multiple scattered tubular adenomas. Pathologic Staging: ypT0, ypN0 Ancillary studies: N/A Comments: Grossly, there is a 1.5 cm granular depressed area of mucosa present at the distal portion of the specimen. This entire area has been submitted for histologic evaluation revealing inflamed, fibrotic, and edematous colorectal mucosa, consistent with treatment effect. Adenocarcinoma is not identified. In addition, there are scattered tubular adenomas throughout the specimen. No high grade dysplasia is identified. The case was discussed with Dr. Donell Beers on 12/15/2011. (JBK:gt, 12/15/11) Pecola Leisure MD Pathologist, Electronic Signature (Case signed 12/15/2011) Intraoper  RADIOGRAPHIC STUDIES:    ASSESSMENT: 46 year old female with:  #1. stage III adenocarcinoma of the rectum. Patient is now status post definitive surgery after having received neoadjuvant chemotherapy and radiation therapy. Her final pathology does not reveal any evidence of residual disease. 8  nodes were negative for metastatic disease.  #2 . Patient was begun on adjuvant chemotherapy consisting of Xeloda and tape side of being. She does far has received one cycle. This course was complicated by development of a severe dehydration requiring hospitalization.  #3 Patient and I discussed rationale doing FOLFIRI. Risks and benefits of both 5-FU leucovorin and irinotecan were discussed with the patient. She understands literature was given to her.  A total of 4 cycles will be planned.   PLAN: #1 patient will proceed with IV hydration all this week.  #2 she will return in one week's time in followup. Certainly she can call us if she feels like she needs to be seen sooner. All questions were answered. The patient knows to call the clinic with any problems, questions or concerns. We can certainly see the patient much sooner if necessary.  I spent >25 minutes counseling the patient face to face. The total time spent in the appointment was 30 minutes.    Drue Second, MD Medical/Oncology [facility] [phone] (beeper) [phone] (Office)  06/11/2012, 10:55 AM

## 2012-06-12 ENCOUNTER — Telehealth: Payer: Self-pay | Admitting: *Deleted

## 2012-06-12 NOTE — Telephone Encounter (Signed)
Per staff message and POF I have scheduled appts.  JMW  

## 2012-06-12 NOTE — Telephone Encounter (Signed)
Add on injection for the 06-20-2012  Sent michelle email to set up patient's fluids appointments  IVF on 9/24, 9/25, 9/26, 9/27, 9/28, 9/30,10/1

## 2012-06-13 ENCOUNTER — Telehealth: Payer: Self-pay | Admitting: *Deleted

## 2012-06-13 NOTE — Telephone Encounter (Signed)
Spoke with MD who advised she will call pt and husband with instructions to go to ED.

## 2012-06-13 NOTE — Telephone Encounter (Signed)
Pt's husband called states " she has had diarrhea, broke the bag, tried Immodium, she is hot and acting crazy, angry;  does not want to talk to anybody. " Asked Mr. Felipe if pt is running fever.  Replied "she will not talk to anyone, doesn't let anyone near her." Encouraged Mr. Pucciarelli to tell pt we need to know if she is running a fever and need to know her tempeture.. Mr. Chura states " I will bring her tomorrow morning" Discussed with Mr. Sommers, MD is currently making rounds in hospital. I will need to discuss with MD prior to making any appts. Encouraged again to try and take pt's tempeture to rule out fever. If pt's symptoms worsen to take pt to ED or urgent care or if she continues, have diarrhea uncontrolled by Immodium. Pt possibly dehydrated. Mr. Grenz requested I speak with MD in am about pt being seen sometime during the day. Informed Mr. Cothran I will address these concerns with MD and call with with further information in the am. In the meantime, continue to monitor pt and take to ED if symptoms persist. Mr. Gavina denied any further questions.  Message to MD

## 2012-06-14 ENCOUNTER — Telehealth: Payer: Self-pay | Admitting: *Deleted

## 2012-06-14 ENCOUNTER — Other Ambulatory Visit: Payer: Self-pay | Admitting: *Deleted

## 2012-06-14 ENCOUNTER — Ambulatory Visit (HOSPITAL_BASED_OUTPATIENT_CLINIC_OR_DEPARTMENT_OTHER): Payer: BC Managed Care – PPO

## 2012-06-14 ENCOUNTER — Encounter: Payer: Self-pay | Admitting: *Deleted

## 2012-06-14 ENCOUNTER — Ambulatory Visit (HOSPITAL_BASED_OUTPATIENT_CLINIC_OR_DEPARTMENT_OTHER): Payer: BC Managed Care – PPO | Admitting: Lab

## 2012-06-14 ENCOUNTER — Encounter: Payer: Self-pay | Admitting: Oncology

## 2012-06-14 ENCOUNTER — Ambulatory Visit (HOSPITAL_BASED_OUTPATIENT_CLINIC_OR_DEPARTMENT_OTHER): Payer: BC Managed Care – PPO | Admitting: Oncology

## 2012-06-14 VITALS — BP 93/63 | HR 73 | Temp 98.4°F | Resp 20 | Ht <= 58 in | Wt 82.0 lb

## 2012-06-14 VITALS — BP 88/59 | HR 71 | Temp 97.3°F | Resp 18

## 2012-06-14 DIAGNOSIS — E860 Dehydration: Secondary | ICD-10-CM

## 2012-06-14 DIAGNOSIS — C20 Malignant neoplasm of rectum: Secondary | ICD-10-CM

## 2012-06-14 DIAGNOSIS — R197 Diarrhea, unspecified: Secondary | ICD-10-CM

## 2012-06-14 LAB — COMPREHENSIVE METABOLIC PANEL (CC13)
ALT: 15 U/L (ref 0–55)
Alkaline Phosphatase: 138 U/L (ref 40–150)
Glucose: 129 mg/dl — ABNORMAL HIGH (ref 70–99)
Sodium: 140 mEq/L (ref 136–145)
Total Bilirubin: 0.6 mg/dL (ref 0.20–1.20)
Total Protein: 6.4 g/dL (ref 6.4–8.3)

## 2012-06-14 LAB — CBC WITH DIFFERENTIAL/PLATELET
BASO%: 0.2 % (ref 0.0–2.0)
EOS%: 1.2 % (ref 0.0–7.0)
LYMPH%: 8.6 % — ABNORMAL LOW (ref 14.0–49.7)
MCH: 31.2 pg (ref 25.1–34.0)
MCHC: 33.8 g/dL (ref 31.5–36.0)
MCV: 92.5 fL (ref 79.5–101.0)
MONO%: 8.8 % (ref 0.0–14.0)
Platelets: 133 10*3/uL — ABNORMAL LOW (ref 145–400)
RBC: 3.32 10*6/uL — ABNORMAL LOW (ref 3.70–5.45)

## 2012-06-14 MED ORDER — SODIUM CHLORIDE 0.9 % IV SOLN
INTRAVENOUS | Status: DC
  Administered 2012-06-14: 13:00:00 via INTRAVENOUS

## 2012-06-14 MED ORDER — DIPHENOXYLATE-ATROPINE 2.5-0.025 MG PO TABS: 1.0000 | ORAL_TABLET | Freq: Four times a day (QID) | ORAL | Status: DC | PRN

## 2012-06-14 NOTE — Telephone Encounter (Signed)
06-15-2012  06-17-2012  Sent michelle email to set up 06-15-2012   06-17-2012

## 2012-06-14 NOTE — Telephone Encounter (Signed)
please schedule pt for walk in lab/ 1030/ MD at 11am

## 2012-06-14 NOTE — Patient Instructions (Addendum)
Diarrhea  Infections caused by germs (bacterial) or a virus commonly cause diarrhea. Your caregiver has determined that with time, rest and fluids, the diarrhea should improve. In general, eat normally while drinking more water than usual. Although water may prevent dehydration, it does not contain salt and minerals (electrolytes). Broths, weak tea without caffeine and oral rehydration solutions (ORS) replace fluids and electrolytes.  Small amounts of fluids should be taken frequently. Large amounts at one time may not be tolerated. Plain water may be harmful in infants and the elderly. Oral rehydrating solutions (ORS) are available at pharmacies and grocery stores. ORS replace water and important electrolytes in proper proportions. Sports drinks are not as effective as ORS and may be harmful due to sugars worsening diarrhea.  · ORS is especially recommended for use in children with diarrhea. As a general guideline for children, replace any new fluid losses from diarrhea and/or vomiting with ORS as follows:  · If your child weighs 22 pounds or under (10 kg or less), give 60-120 mL (¼ - ½ cup or 2 - 4 ounces) of ORS for each episode of diarrheal stool or vomiting episode.  · If your child weighs more than 22 pounds (more than 10 kgs), give 120-240 mL (½ - 1 cup or 4 - 8 ounces) of ORS for each diarrheal stool or episode of vomiting.  · While correcting for dehydration, children should eat normally. However, foods high in sugar should be avoided because this may worsen diarrhea. Large amounts of carbonated soft drinks, juice, gelatin desserts and other highly sugared drinks should be avoided.  · After correction of dehydration, other liquids that are appealing to the child may be added. Children should drink small amounts of fluids frequently and fluids should be increased as tolerated. Children should drink enough fluids to keep urine clear or pale yellow.  · Adults should eat normally while drinking more fluids than  usual. Drink small amounts of fluids frequently and increase as tolerated. Drink enough fluids to keep urine clear or pale yellow. Broths, weak decaffeinated tea, lemon lime soft drinks (allowed to go flat) and ORS replace fluids and electrolytes.  · Avoid:  · Carbonated drinks.  · Juice.  · Extremely hot or cold fluids.  · Caffeine drinks.  · Fatty, greasy foods.  · Alcohol.  · Tobacco.  · Too much intake of anything at one time.  · Gelatin desserts.  · Probiotics are active cultures of beneficial bacteria. They may lessen the amount and number of diarrheal stools in adults. Probiotics can be found in yogurt with active cultures and in supplements.  · Wash hands well to avoid spreading bacteria and virus.  · Anti-diarrheal medications are not recommended for infants and children.  · Only take over-the-counter or prescription medicines for pain, discomfort or fever as directed by your caregiver. Do not give aspirin to children because it may cause Reye's Syndrome.  · For adults, ask your caregiver if you should continue all prescribed and over-the-counter medicines.  · If your caregiver has given you a follow-up appointment, it is very important to keep that appointment. Not keeping the appointment could result in a chronic or permanent injury, and disability. If there is any problem keeping the appointment, you must call back to this facility for assistance.  SEEK IMMEDIATE MEDICAL CARE IF:   · You or your child is unable to keep fluids down or other symptoms or problems become worse in spite of treatment.  · Vomiting or diarrhea   develops and becomes persistent.  · There is vomiting of blood or bile (green material).  · There is blood in the stool or the stools are black and tarry.  · There is no urine output in 6-8 hours or there is only a small amount of very dark urine.  · Abdominal pain develops, increases or localizes.  · You have a fever.  · Your baby is older than 3 months with a rectal temperature of 102° F  (38.9° C) or higher.  · Your baby is 3 months old or younger with a rectal temperature of 100.4° F (38° C) or higher.  · You or your child develops excessive weakness, dizziness, fainting or extreme thirst.  · You or your child develops a rash, stiff neck, severe headache or become irritable or sleepy and difficult to awaken.  MAKE SURE YOU:   · Understand these instructions.  · Will watch your condition.  · Will get help right away if you are not doing well or get worse.  Document Released: 09/01/2002 Document Revised: 08/31/2011 Document Reviewed: 07/19/2009  [employer]® Patient Information ©2012 [employer].  Diet for Diarrhea, Adult  Having frequent, runny stools (diarrhea) has many causes. Diarrhea may be caused or worsened by food or drink. Diarrhea may be relieved by changing your diet.  IF YOU ARE NOT TOLERATING SOLID FOODS:  · Drink enough water and fluids to keep your urine clear or pale yellow.  · Avoid sugary drinks and sodas as well as milk-based beverages.  · Avoid beverages containing caffeine and alcohol.  · You may try rehydrating beverages. You can make your own by following this recipe:  · ½ tsp table salt.  · ¾ tsp baking soda.  · ? tsp salt substitute (potassium chloride).  · 1 tbs + 1 tsp sugar.  · 1 qt water.  As your stools become more solid, you can start eating solid foods. Add foods one at a time. If a certain food causes your diarrhea to get worse, avoid that food and try other foods. A low fiber, low-fat, and lactose-free diet is recommended. Small, frequent meals may be better tolerated.   Starches  · Allowed:  White, French, and pita breads, plain rolls, buns, bagels. Plain muffins, matzo. Soda, saltine, or graham crackers. Pretzels, melba toast, zwieback. Cooked cereals made with water: cornmeal, farina, cream cereals. Dry cereals: refined corn, wheat, rice. Potatoes prepared any way without skins, refined macaroni, spaghetti, noodles, refined rice.  · Avoid:  Bread, rolls, or  crackers made with whole wheat, multi-grains, rye, bran seeds, nuts, or coconut. Corn tortillas or taco shells. Cereals containing whole grains, multi-grains, bran, coconut, nuts, or raisins. Cooked or dry oatmeal. Coarse wheat cereals, granola. Cereals advertised as "high-fiber." Potato skins. Whole grain pasta, wild or brown rice. Popcorn. Sweet potatoes/yams. Sweet rolls, doughnuts, waffles, pancakes, sweet breads.  Vegetables  · Allowed: Strained tomato and vegetable juices. Most well-cooked and canned vegetables without seeds. Fresh: Tender lettuce, cucumber without the skin, cabbage, spinach, bean sprouts.  · Avoid: Fresh, cooked, or canned: Artichokes, baked beans, beet greens, broccoli, Brussels sprouts, corn, kale, legumes, peas, sweet potatoes. Cooked: Green or red cabbage, spinach. Avoid large servings of any vegetables, because vegetables shrink when cooked, and they contain more fiber per serving than fresh vegetables.  Fruit  · Allowed: All fruit juices except prune juice. Cooked or canned: Apricots, applesauce, cantaloupe, cherries, fruit cocktail, grapefruit, grapes, kiwi, mandarin oranges, peaches, pears, plums, watermelon. Fresh: Apples without skin, ripe banana,   grapes, cantaloupe, cherries, grapefruit, peaches, oranges, plums. Keep servings limited to ½ cup or 1 piece.  · Avoid: Fresh: Apple with skin, apricots, mango, pears, raspberries, strawberries. Prune juice, stewed or dried prunes. Dried fruits, raisins, dates. Large servings of all fresh fruits.  Meat and Meat Substitutes  · Allowed: Ground or well-cooked tender beef, ham, veal, lamb, pork, or poultry. Eggs, plain cheese. Fish, oysters, shrimp, lobster, other seafoods. Liver, organ meats.  · Avoid: Tough, fibrous meats with gristle. Peanut butter, smooth or chunky. Cheese, nuts, seeds, legumes, dried peas, beans, lentils.  Milk  · Allowed: Yogurt, lactose-free milk, kefir, drinkable yogurt, buttermilk, soy milk.  · Avoid: Milk, chocolate  milk, beverages made with milk, such as milk shakes.  Soups  · Allowed: Bouillon, broth, or soups made from allowed foods. Any strained soup.  · Avoid: Soups made from vegetables that are not allowed, cream or milk-based soups.  Desserts and Sweets  · Allowed: Sugar-free gelatin, sugar-free frozen ice pops made without sugar alcohol.  · Avoid: Plain cakes and cookies, pie made with allowed fruit, pudding, custard, cream pie. Gelatin, fruit, ice, sherbet, frozen ice pops. Ice cream, ice milk without nuts. Plain hard candy, honey, jelly, molasses, syrup, sugar, chocolate syrup, gumdrops, marshmallows.  Fats and Oils  · Allowed: Avoid any fats and oils.  · Avoid: Seeds, nuts, olives, avocados. Margarine, butter, cream, mayonnaise, salad oils, plain salad dressings made from allowed foods. Plain gravy, crisp bacon without rind.  Beverages  · Allowed: Water, decaffeinated teas, oral rehydration solutions, sugar-free beverages.  · Avoid: Fruit juices, caffeinated beverages (coffee, tea, soda or pop), alcohol, sports drinks, or lemon-lime soda or pop.  Condiments  · Allowed: Ketchup, mustard, horseradish, vinegar, cream sauce, cheese sauce, cocoa powder. Spices in moderation: allspice, basil, bay leaves, celery powder or leaves, cinnamon, cumin powder, curry powder, ginger, mace, marjoram, onion or garlic powder, oregano, paprika, parsley flakes, ground pepper, rosemary, sage, savory, tarragon, thyme, turmeric.  · Avoid: Coconut, honey.  Weight Monitoring: Weigh yourself every day. You should weigh yourself in the morning after you urinate and before you eat breakfast. Wear the same amount of clothing when you weigh yourself. Record your weight daily. Bring your recorded weights to your clinic visits. Tell your caregiver right away if you have gained 3 lb/1.4 kg or more in 1 day, 5 lb/2.3 kg in a week, or whatever amount you were told to report.  SEEK IMMEDIATE MEDICAL CARE IF:   · You are unable to keep fluids  down.  · You start to throw up (vomit) or diarrhea keeps coming back (persistent).  · Abdominal pain develops, increases, or can be felt in one place (localizes).  · You have an oral temperature above 102° F (38.9° C), not controlled by medicine.  · Diarrhea contains blood or mucus.  · You develop excessive weakness, dizziness, fainting, or extreme thirst.  MAKE SURE YOU:   · Understand these instructions.  · Will watch your condition.  · Will get help right away if you are not doing well or get worse.  Document Released: 12/02/2003 Document Revised: 08/31/2011 Document Reviewed: 03/25/2009  [employer]® Patient Information ©2012 [employer].

## 2012-06-15 ENCOUNTER — Ambulatory Visit (HOSPITAL_BASED_OUTPATIENT_CLINIC_OR_DEPARTMENT_OTHER): Payer: BC Managed Care – PPO

## 2012-06-15 VITALS — BP 111/67 | HR 74 | Temp 98.1°F | Resp 18

## 2012-06-15 DIAGNOSIS — C20 Malignant neoplasm of rectum: Secondary | ICD-10-CM

## 2012-06-15 DIAGNOSIS — R197 Diarrhea, unspecified: Secondary | ICD-10-CM

## 2012-06-15 DIAGNOSIS — E860 Dehydration: Secondary | ICD-10-CM

## 2012-06-15 MED ORDER — SODIUM CHLORIDE 0.9 % IV SOLN: INTRAVENOUS | Status: DC

## 2012-06-15 MED ORDER — SODIUM CHLORIDE 0.9 % IV SOLN
INTRAVENOUS | Status: DC
  Administered 2012-06-15: 11:00:00 via INTRAVENOUS

## 2012-06-17 ENCOUNTER — Ambulatory Visit (HOSPITAL_BASED_OUTPATIENT_CLINIC_OR_DEPARTMENT_OTHER): Payer: BC Managed Care – PPO

## 2012-06-17 VITALS — BP 93/60 | HR 72 | Temp 97.8°F | Resp 18

## 2012-06-17 DIAGNOSIS — R197 Diarrhea, unspecified: Secondary | ICD-10-CM

## 2012-06-17 DIAGNOSIS — C20 Malignant neoplasm of rectum: Secondary | ICD-10-CM

## 2012-06-17 DIAGNOSIS — E860 Dehydration: Secondary | ICD-10-CM

## 2012-06-17 MED ORDER — HEPARIN SOD (PORK) LOCK FLUSH 100 UNIT/ML IV SOLN
500.0000 [IU] | Freq: Once | INTRAVENOUS | Status: AC
  Administered 2012-06-17: 500 [IU] via INTRAVENOUS
  Filled 2012-06-17: qty 5

## 2012-06-17 MED ORDER — SODIUM CHLORIDE 0.9 % IV SOLN: INTRAVENOUS | Status: DC

## 2012-06-17 MED ORDER — SODIUM CHLORIDE 0.9 % IV SOLN
INTRAVENOUS | Status: DC
  Administered 2012-06-17: 12:00:00 via INTRAVENOUS

## 2012-06-17 MED ORDER — SODIUM CHLORIDE 0.9 % IJ SOLN
10.0000 mL | INTRAMUSCULAR | Status: DC | PRN
  Administered 2012-06-17: 10 mL via INTRAVENOUS
  Filled 2012-06-17: qty 10

## 2012-06-17 NOTE — Patient Instructions (Signed)
[facility] Discharge Instructions for Patients Receiving Chemotherapy  Today you received the following chemotherapy agents .  IV FLUIDS ONLY, NO CHEMO 06/17/12  To help prevent nausea and vomiting after your treatment, we encourage you to take your nausea medication as prescribed by MD   If you develop nausea and vomiting that is not controlled by your nausea medication, call the clinic. If it is after clinic hours your family physician or the after hours number for the clinic or go to the Emergency Department.   BELOW ARE SYMPTOMS THAT SHOULD BE REPORTED IMMEDIATELY:  *FEVER GREATER THAN 100.5 F  *CHILLS WITH OR WITHOUT FEVER  NAUSEA AND VOMITING THAT IS NOT CONTROLLED WITH YOUR NAUSEA MEDICATION  *UNUSUAL SHORTNESS OF BREATH  *UNUSUAL BRUISING OR BLEEDING  TENDERNESS IN MOUTH AND THROAT WITH OR WITHOUT PRESENCE OF ULCERS  *URINARY PROBLEMS  *BOWEL PROBLEMS  UNUSUAL RASH Items with * indicate a potential emergency and should be followed up as soon as possible.   The clinic phone number is [phone].

## 2012-06-18 ENCOUNTER — Encounter: Payer: Self-pay | Admitting: Oncology

## 2012-06-18 ENCOUNTER — Ambulatory Visit (HOSPITAL_BASED_OUTPATIENT_CLINIC_OR_DEPARTMENT_OTHER): Payer: BC Managed Care – PPO

## 2012-06-18 ENCOUNTER — Other Ambulatory Visit: Payer: BC Managed Care – PPO | Admitting: Lab

## 2012-06-18 ENCOUNTER — Other Ambulatory Visit (HOSPITAL_BASED_OUTPATIENT_CLINIC_OR_DEPARTMENT_OTHER): Payer: BC Managed Care – PPO | Admitting: Lab

## 2012-06-18 ENCOUNTER — Telehealth: Payer: Self-pay | Admitting: Oncology

## 2012-06-18 ENCOUNTER — Ambulatory Visit: Payer: BC Managed Care – PPO | Admitting: Oncology

## 2012-06-18 VITALS — BP 104/62 | HR 65 | Temp 98.4°F | Resp 20 | Ht <= 58 in | Wt 82.1 lb

## 2012-06-18 DIAGNOSIS — Z5111 Encounter for antineoplastic chemotherapy: Secondary | ICD-10-CM

## 2012-06-18 DIAGNOSIS — D709 Neutropenia, unspecified: Secondary | ICD-10-CM

## 2012-06-18 DIAGNOSIS — R110 Nausea: Secondary | ICD-10-CM

## 2012-06-18 DIAGNOSIS — C20 Malignant neoplasm of rectum: Secondary | ICD-10-CM

## 2012-06-18 LAB — COMPREHENSIVE METABOLIC PANEL (CC13)
CO2: 25 mEq/L (ref 22–29)
Calcium: 8.6 mg/dL (ref 8.4–10.4)
Chloride: 104 mEq/L (ref 98–107)
Creatinine: 0.6 mg/dL (ref 0.6–1.1)
Glucose: 75 mg/dl (ref 70–99)
Sodium: 140 mEq/L (ref 136–145)
Total Bilirubin: 0.5 mg/dL (ref 0.20–1.20)
Total Protein: 6.5 g/dL (ref 6.4–8.3)

## 2012-06-18 LAB — CBC WITH DIFFERENTIAL/PLATELET
Basophils Absolute: 0.1 10*3/uL (ref 0.0–0.1)
Eosinophils Absolute: 0.2 10*3/uL (ref 0.0–0.5)
HGB: 10.8 g/dL — ABNORMAL LOW (ref 11.6–15.9)
LYMPH%: 7.9 % — ABNORMAL LOW (ref 14.0–49.7)
MCV: 89.6 fL (ref 79.5–101.0)
MONO#: 1.5 10*3/uL — ABNORMAL HIGH (ref 0.1–0.9)
MONO%: 8.1 % (ref 0.0–14.0)
NEUT#: 15 10*3/uL — ABNORMAL HIGH (ref 1.5–6.5)
Platelets: 136 10*3/uL — ABNORMAL LOW (ref 145–400)

## 2012-06-18 MED ORDER — FLUOROURACIL CHEMO INJECTION 5 GM/100ML
2400.0000 mg/m2 | INTRAVENOUS | Status: DC
  Administered 2012-06-18: 2950 mg via INTRAVENOUS
  Filled 2012-06-18: qty 59

## 2012-06-18 MED ORDER — SODIUM CHLORIDE 0.9 % IV SOLN: INTRAVENOUS | Status: DC

## 2012-06-18 MED ORDER — SODIUM CHLORIDE 0.9 % IJ SOLN
10.0000 mL | INTRAMUSCULAR | Status: DC | PRN
  Filled 2012-06-18: qty 10

## 2012-06-18 MED ORDER — FLUOROURACIL CHEMO INJECTION 500 MG/10ML
400.0000 mg/m2 | Freq: Once | INTRAVENOUS | Status: AC
  Administered 2012-06-18: 500 mg via INTRAVENOUS
  Filled 2012-06-18: qty 10

## 2012-06-18 MED ORDER — ATROPINE SULFATE 0.4 MG/ML IJ SOLN
0.5000 mg | Freq: Once | INTRAMUSCULAR | Status: DC | PRN
  Filled 2012-06-18: qty 1.25

## 2012-06-18 MED ORDER — IRINOTECAN HCL CHEMO INJECTION 100 MG/5ML
179.0000 mg/m2 | Freq: Once | INTRAVENOUS | Status: AC
  Administered 2012-06-18: 220 mg via INTRAVENOUS
  Filled 2012-06-18: qty 11

## 2012-06-18 MED ORDER — DEXAMETHASONE SODIUM PHOSPHATE 4 MG/ML IJ SOLN
20.0000 mg | Freq: Once | INTRAMUSCULAR | Status: AC
  Administered 2012-06-18: 20 mg via INTRAVENOUS

## 2012-06-18 MED ORDER — PROMETHAZINE HCL 25 MG/ML IJ SOLN
12.5000 mg | Freq: Once | INTRAMUSCULAR | Status: AC
  Administered 2012-06-18: 25 mg via INTRAVENOUS
  Filled 2012-06-18: qty 1

## 2012-06-18 MED ORDER — ONDANSETRON 8 MG/50ML IVPB (CHCC): 8.0000 mg | Freq: Once | INTRAVENOUS | Status: DC

## 2012-06-18 MED ORDER — LEUCOVORIN CALCIUM INJECTION 350 MG
406.0000 mg/m2 | Freq: Once | INTRAVENOUS | Status: AC
  Administered 2012-06-18: 500 mg via INTRAVENOUS
  Filled 2012-06-18: qty 25

## 2012-06-18 MED ORDER — ONDANSETRON 16 MG/50ML IVPB (CHCC)
16.0000 mg | Freq: Once | INTRAVENOUS | Status: AC
  Administered 2012-06-18: 16 mg via INTRAVENOUS

## 2012-06-18 MED ORDER — SODIUM CHLORIDE 0.9 % IV SOLN
Freq: Once | INTRAVENOUS | Status: AC
  Administered 2012-06-18: 16:00:00 via INTRAVENOUS

## 2012-06-18 MED ORDER — PEGFILGRASTIM INJECTION 6 MG/0.6ML: 6.0000 mg | Freq: Once | SUBCUTANEOUS | Status: DC

## 2012-06-18 NOTE — Patient Instructions (Addendum)
[facility] Discharge Instructions for Patients Receiving Chemotherapy  Today you received the following chemotherapy agents:Leucovorin/5FU/ Camptosar  To help prevent nausea and vomiting after your treatment, we encourage you to take your nausea medication. If you develop nausea and vomiting that is not controlled by your nausea medication, call the clinic. If it is after clinic hours your family physician or the after hours number for the clinic or go to the Emergency Department.   BELOW ARE SYMPTOMS THAT SHOULD BE REPORTED IMMEDIATELY:  *FEVER GREATER THAN 100.5 F  *CHILLS WITH OR WITHOUT FEVER  NAUSEA AND VOMITING THAT IS NOT CONTROLLED WITH YOUR NAUSEA MEDICATION  *UNUSUAL SHORTNESS OF BREATH  *UNUSUAL BRUISING OR BLEEDING  TENDERNESS IN MOUTH AND THROAT WITH OR WITHOUT PRESENCE OF ULCERS  *URINARY PROBLEMS  *BOWEL PROBLEMS  UNUSUAL RASH Items with * indicate a potential emergency and should be followed up as soon as possible.   Please let the nurse know about any problems that you may have experienced. Feel free to call the clinic you have any questions or concerns. The clinic phone number is [phone].   I have been informed and understand all the instructions given to me. I know to contact the clinic, my physician, or go to the Emergency Department if any problems should occur. I do not have any questions at this time, but understand that I may call the clinic during office hours   should I have any questions or need assistance in obtaining follow up care.    __________________________________________  _____________  __________ Signature of Patient or Authorized Representative            Date                   Time    __________________________________________ Nurse's Signature   [facility]      I have been informed and understand all of the instructions given to me and have received a copy. I have been instructed to call  the clinic [phone]  or my family physician as soon as possible for continued medical care, if indicated. I do not have any more questions at this time but understand that I may call the Cancer Center at [phone] during office hours should I have questions or need assistance in obtaining follow-up care.      _________________________________________      _______________     __________ Signature of Patient or Authorized Representative        Date                            Time      _________________________________________ Nurse's Signature

## 2012-06-18 NOTE — Progress Notes (Signed)
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  PRIOR THERAPY:   #1 patient presented with a several month history of diarrhea and then subsequent bleeding. She was found to be anemic with a hemoglobin of 10. Because of this she went on to have a colonoscopy performed. The colonoscopy showed multiple sessile polyps in the cecum ascending transverse and sigmoid colon as well as the rectum. The rectal area showed a large exophytic rectal mass approximately 1 cm above the dentate line measuring 3-4 cm. She had a biopsy of this performed and was found to be an adenocarcinoma consistent with a rectal primary.  #2 patient was seen by Dr. [name] at [facility] who performed a rectal ultrasound. The staging clinically was T3 N1. It was recommended by Dr. [name] the patient undergo neoadjuvant chemotherapy and radiation higher to her definitive surgery.  #3 Patient has completed concurrent radiation and chemotherapy. Her chemotherapy consisted of single agent xeloda administrated 08/22/11 - 10/09/2011. Overall she tolerated her treatment very well  #4 Has completed all of neoadjuvant treatment And the PET scan shows no evidence of distant metastasis and a para rectal peritoneal nodes less impressive she has had significant response to therapy.  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 APC is negative  #6 patient had a laparotomy with proctocolectomy, J-pouch with ileoanal anastomosis, loop ileostomy on 12/14/2011. The final pathology did not reveal any evidence of residual disease. Patient had a  complicated postop course including high output ileostomy. She required intensive IV fluids on an outpatient basis to prevent dehydration.  #7 patient began adjuvant chemotherapy consisting of XELOX starting 03/05/2012 on a 21 day cycle. A Total of 6 cycles were planned unfortunate patient was not able to tolerate this therapy due to side effects from the xeloda With development of hand-foot syndrome.She was not able to tolerate oxaliplatin due to significant toxicity from cold sensitivity. In that this treatment is being discontinued.  CURRENT THERAPY: Cycle 2 of FOLFIRI adjuvantly  INTERVAL HISTORY: Renee Nicholson 46 y.o. female returns for Followup visit today Prior to her chemotherapy which is cycle 2 FOLFIRI. Clinically she seems to be doing well she does feel a little bit tired. She did have diarrhea and we did hydrate her very carefully over the last 1 week. Her bowels are much better and she has more formed stools present in her colostomy bag. She otherwise denies any nausea vomiting fevers chills night sweats headaches shortness of breath chest pains or palpitations she has no myalgias and arthralgias. Remainder of the 10 point review of systems is negative. MEDICAL HISTORY: Past Medical History  Diagnosis Date  . Anemia   . Diarrhea   . Rectal cancer 08/16/2011  . Colon cancer   . Anxiety   . Bright red rectal bleeding 09/13/2011  . History of chemotherapy     completed 09/2011   . Hx of radiation therapy 08/29/11 to 10/09/11    rectum    ALLERGIES:   has no known allergies.  MEDICATIONS:  Current Outpatient Prescriptions  Medication Sig Dispense Refill  . acetaminophen (TYLENOL) 325 MG tablet Take  2 tablets (650 mg total) by mouth every 4 (four) hours as needed.  200 tablet  1  . dexamethasone (DECADRON) 4 MG tablet Take 2 tablets by mouth two times a day starting the day after chemotherapy for 3 days. Take with food.  30 tablet  1  . diphenoxylate-atropine (LOMOTIL) 2.5-0.025 MG  per tablet Take 1 tablet by mouth 4 (four) times daily as needed for diarrhea or loose stools.  60 tablet  5  . feeding supplement (ENSURE COMPLETE) LIQD Take 237 mLs by mouth 3 (three) times daily between meals.  50 Bottle  6  . feeding supplement (RESOURCE BREEZE) LIQD Take 1 Container by mouth 3 (three) times daily between meals.  30 Container  6  . Ferrous Sulfate (IRON) 28 MG TABS Take 1 tablet by mouth daily.      . Heparin Lock Flush (HEPARIN FLUSH, PORCINE,) 100 UNIT/ML injection 1 mL (100 Units total) by Intracatheter route as needed (250 units per lumen).  5 Syringe  3  . HYDROcodone-acetaminophen (VICODIN) 5-500 MG per tablet Take 1-2 tablets by mouth every 6 (six) hours as needed for pain.  30 tablet  0  . lidocaine-prilocaine (EMLA) cream Apply topically as needed.  30 g  6  . loperamide (IMODIUM A-D) 2 MG tablet Take 2 tab at onset of diarrhea, then 1 tab q2h until 12 hr have passed without a BM.  May take 2 tab q4h qhs. If diarrhea recurs repeat.  60 tablet  4  . loperamide (IMODIUM) 2 MG capsule Take 2 capsules by mouth Every 4 hours as needed.      Marland Kitchen LORazepam (ATIVAN) 0.5 MG tablet Take 1 tablet (0.5 mg total) by mouth every 6 (six) hours as needed for anxiety.  30 tablet  0  . megestrol (MEGACE) 400 MG/10ML suspension Take 10 mLs (400 mg total) by mouth 2 (two) times daily.  240 mL  1  . Multiple Vitamin (MULTIVITAMIN) tablet Take 1 tablet by mouth every morning.       . ondansetron (ZOFRAN) 8 MG tablet Take 1 tablet two times a day starting the day after chemo for 3 days. Then take 1 tablet two times a day as needed for nausea or vomiting.  30 tablet  1  . pantoprazole (PROTONIX) 40 MG tablet Take 40 mg by mouth daily.      . polysaccharide iron (NIFEREX) 150 MG CAPS capsule Take 150 mg by mouth every morning.       . potassium chloride SA (K-DUR,KLOR-CON) 20 MEQ tablet Take 1 tablet (20 mEq total) by mouth daily.  5 tablet  0  . prochlorperazine (COMPAZINE) 10 MG tablet Take 1  tablet (10 mg total) by mouth every 6 (six) hours as needed (Nausea or vomiting).  30 tablet  1  . prochlorperazine (COMPAZINE) 25 MG suppository Place 1 suppository (25 mg total) rectally every 12 (twelve) hours as needed for nausea.  12 suppository  3  . simethicone (MYLICON) 80 MG chewable tablet Chew 80 mg by mouth every 6 (six) hours as needed. For gas       No current facility-administered medications for this visit.   Facility-Administered Medications Ordered in Other Visits  Medication Dose Route Frequency Provider Last Rate Last Dose  . heparin lock flush 100 unit/mL  500 Units Intravenous Once Victorino December, MD      . heparin lock flush 100 unit/mL  500 Units Intravenous Once Victorino December, MD   500 Units at  06/17/12 1251  . sodium chloride 0.9 % injection 10 mL  10 mL Intravenous PRN Victorino December, MD      . DISCONTD: 0.9 %  sodium chloride infusion   Intravenous Continuous Victorino December, MD      . DISCONTD: sodium chloride 0.9 % injection 10 mL  10 mL Intravenous PRN Victorino December, MD   10 mL at 06/17/12 1251    SURGICAL HISTORY:  Past Surgical History  Procedure Date  . Tubal ligation   . Carpal tunnel release   . Colonoscopy 07/26/2011    Procedure: COLONOSCOPY;  Surgeon: Arlyce Harman, MD;  Location: [facility];  Service: Endoscopy;  Laterality: N/A;  10:40  . Esophagogastroduodenoscopy 07/26/2011    Procedure: ESOPHAGOGASTRODUODENOSCOPY (EGD);  Surgeon: Arlyce Harman, MD;  Location: [facility];  Service: Endoscopy;  Laterality: N/A;  . Esophagogastroduodenoscopy 12/27/2011    Procedure: ESOPHAGOGASTRODUODENOSCOPY (EGD);  Surgeon: Shirley Friar, MD;  Location: [facility];  Service: Endoscopy;  Laterality: N/A;  . Laparoscopic colon resection 12/14/11    diverting ileostomy  . Portacath placement 02/21/2012    Procedure: INSERTION PORT-A-CATH;  Surgeon: [name], MD;  Location: [facility] OR;  Service: General;  Laterality: N/A;    REVIEW OF SYSTEMS:   Pertinent items are noted in HPI.   PHYSICAL EXAMINATION:  otomy site clear no evidence of bleeding  ECOG PERFORMANCE STATUS: 0 - Asymptomatic  Blood pressure 104/62, pulse 65, temperature 98.4 F (36.9 C), temperature source Oral, resp. rate 20, height 4\' 9"  (1.448 m), weight 82 lb 1.6 oz (37.24 kg), last menstrual period 02/20/2012.  LABORATORY DATA: Lab Results  Component Value Date   WBC 18.2* 06/18/2012   HGB 10.8* 06/18/2012   HCT 31.9* 06/18/2012   MCV 89.6 06/18/2012   PLT 136* 06/18/2012      Chemistry      Component Value Date/Time   NA 140 06/14/2012 1040   NA 139 05/06/2012 1316   K 3.6 06/14/2012 1040   K 3.3* 05/06/2012 1316   CL 102 06/14/2012 1040   CL 104 05/06/2012 1316   CO2 26 06/14/2012 1040   CO2 27 05/06/2012 1316   BUN 10.0 06/14/2012 1040   BUN 8 05/06/2012 1316   CREATININE 0.7 06/14/2012 1040   CREATININE 0.70 05/06/2012 1316   CREATININE 0.70 07/27/2011 1306      Component Value Date/Time   CALCIUM 8.9 06/14/2012 1040   CALCIUM 9.0 05/06/2012 1316   ALKPHOS 138 06/14/2012 1040   ALKPHOS 81 05/06/2012 1316   AST 22 06/14/2012 1040   AST 55* 05/06/2012 1316   ALT 15 06/14/2012 1040   ALT 44* 05/06/2012 1316   BILITOT 0.60 06/14/2012 1040   BILITOT 0.7 05/06/2012 1316     ADDITIONAL INFORMATION: 1. Following placement in clearing solution, three more lymph nodes were identified, all of which are benign. Thus, the total lymph node count is 11. (JK:mw 12-18-11) 2. Following an exhaustive search, additional tissue has been submitted in an attempt to recover lymph nodes. Two more lymph nodes were identified, both benign. This brings the total number of lymph nodes recovered to 13, all of which are benign. (JBK:eps 12/26/11) FINAL DIAGNOSIS Diagnosis 1. Colon, resection margin (donut), distal rectal - COLORECTAL MUCOSA WITH FIBROSIS AND EDEMA, CONSISTENT WITH TREATMENT. - THERE IS NO EVIDENCE OF MALIGNANCY. 2. Colon, total resection (incl lymph nodes) - COLON WITH  SCATTERED TUBULAR ADENOMAS. - HIGH GRADE DYSPLASIA IS NOT IDENTIFIED. - BENIGN APPENDIX WITH  FIBROUS OBLITERATION OF THE TIP. - THERE IS NO EVIDENCE OF CARCINOMA IN 8 OF 8 LYMPH NODES (0/8). - SEE ONCOLOGY TABLE BELOW. 3. Colon, resection margin (donut), new distal margin and rectum - BENIGN SQUAMOUS LINED MUCOSA. - THERE IS NO EVIDENCE OF MALIGNANCY. Microscopic Comment 2. COLON AND RECTUM Specimen: Colon, appendix, and terminal ileum. Procedure: Total colectomy. Tumor site: N/A Specimen integrity: Intact. Macroscopic intactness of mesorectum: Complete. Macroscopic tumor perforation: N/A Invasive tumor: N/A 1 of 3 FINAL for RANDIE, SOKOLOW ([id number]) Microscopic Comment(continued) Microscopic extension of invasive tumor: N/A Lymph-Vascular invasion: N/A Peri-neural invasion: N/A Tumor deposit(s) (discontinuous extramural extension): N/A Resection margins: Negative for adenocarcinoma. Treatment effect (neoadjuvant therapy): Present, significant. Number of lymph nodes examined- 8 ; Number positive - 0 (PLEASE NOTE: Additional tissue will be submitted in an attempt to recover more lymph nodes) Additional polyp(s): Multiple scattered tubular adenomas. Pathologic Staging: ypT0, ypN0 Ancillary studies: N/A Comments: Grossly, there is a 1.5 cm granular depressed area of mucosa present at the distal portion of the specimen. This entire area has been submitted for histologic evaluation revealing inflamed, fibrotic, and edematous colorectal mucosa, consistent with treatment effect. Adenocarcinoma is not identified. In addition, there are scattered tubular adenomas throughout the specimen. No high grade dysplasia is identified. The case was discussed with Dr. Donell Beers on 12/15/2011. (JBK:gt, 12/15/11) Pecola Leisure MD Pathologist, Electronic Signature (Case signed 12/15/2011) Intraoper  RADIOGRAPHIC STUDIES:    ASSESSMENT: 46 year old female with:  #1. stage III adenocarcinoma of  the rectum. Patient is now status post definitive surgery after having received neoadjuvant chemotherapy and radiation therapy. Her final pathology does not reveal any evidence of residual disease. 8 nodes were negative for metastatic disease.  #2 . Patient was begun on adjuvant chemotherapy consisting of Xeloda and tape side of being. She does far has received one cycle. This course was complicated by development of a severe dehydration requiring hospitalization.  #3 Patient and I discussed rationale doing FOLFIRI. Risks and benefits of both 5-FU leucovorin and irinotecan were discussed with the patient. She understands literature was given to her.  A total of 4 cycles will be planned.   PLAN: #1 patient will proceed with her scheduled chemotherapy today.  #2 we will get hydrated aggressively over the next one week. Patient does get Neulasta with her chemotherapy. Graph #3 she will be seen back in one week's time for followup here  #2 patient does have history of neutropenia I do think it is important that we preemptively start her on Neulasta and I have set her up for this on day when she comes in to have a pump DC'd. All questions were answered. The patient knows to call the clinic with any problems, questions or concerns. We can certainly see the patient much sooner if necessary.  I spent >25 minutes counseling the patient face to face. The total time spent in the appointment was 30 minutes.    Drue Second, MD Medical/Oncology [facility] [phone] (beeper) [phone] (Office)  06/18/2012, 12:44 PM

## 2012-06-18 NOTE — Telephone Encounter (Signed)
Pt is aware that she will get her appt calendar from [facility] in the chemo room. Sent michelle a staff message to re-arrange the appts

## 2012-06-18 NOTE — Patient Instructions (Addendum)
Proceed with chemotherapy today  IVF on 9/25, 9/26, 9/27, 9/28, 9/30, 10/1  See me on 06/25/12

## 2012-06-19 ENCOUNTER — Other Ambulatory Visit: Payer: Self-pay | Admitting: Medical Oncology

## 2012-06-19 ENCOUNTER — Ambulatory Visit (HOSPITAL_BASED_OUTPATIENT_CLINIC_OR_DEPARTMENT_OTHER): Payer: BC Managed Care – PPO

## 2012-06-19 VITALS — BP 141/92 | HR 71 | Temp 98.8°F

## 2012-06-19 DIAGNOSIS — C20 Malignant neoplasm of rectum: Secondary | ICD-10-CM

## 2012-06-19 DIAGNOSIS — E860 Dehydration: Secondary | ICD-10-CM

## 2012-06-19 MED ORDER — LORAZEPAM 2 MG/ML IJ SOLN
0.5000 mg | Freq: Once | INTRAMUSCULAR | Status: AC
  Administered 2012-06-19: 0.5 mg via INTRAVENOUS

## 2012-06-19 MED ORDER — SODIUM CHLORIDE 0.9 % IV SOLN
INTRAVENOUS | Status: DC
  Administered 2012-06-19: 10:00:00 via INTRAVENOUS

## 2012-06-19 MED ORDER — PROMETHAZINE HCL 25 MG/ML IJ SOLN
12.5000 mg | Freq: Once | INTRAMUSCULAR | Status: AC
  Administered 2012-06-19: 12.5 mg via INTRAVENOUS
  Filled 2012-06-19: qty 1

## 2012-06-19 NOTE — Progress Notes (Signed)
1120-Pt c/o leg cramping that just started.  States her legs feel "sore, crampy".  Spoke with Morrie Sheldon, RN for Dr. Welton Flakes who will speak with Dr. Welton Flakes to see if she would like to do anything additional for leg cramps.  Pt denies any diarrhea or vomiting today-dhp, rn 1150- Dr. Welton Flakes chairside to assess patient-dhp, rn

## 2012-06-19 NOTE — Progress Notes (Signed)
1205- Pt reports her legs are better, no cramping-dhp, rn

## 2012-06-19 NOTE — Patient Instructions (Addendum)
Dehydration, Adult Dehydration means your body does not have as much fluid as it needs. Your kidneys, brain, and heart will not work properly without the right amount of fluids and salt.  HOME CARE  Ask your doctor how to replace body fluid losses (rehydrate).   Drink enough fluids to keep your pee (urine) clear or pale yellow.   Drink small amounts of fluids often if you feel sick to your stomach (nauseous) or throw up (vomit).   Eat like you normally do.   Avoid:   Foods or drinks high in sugar.   Bubbly (carbonated) drinks.   Juice.   Very hot or cold fluids.   Drinks with caffeine.   Fatty, greasy foods.   Alcohol.   Tobacco.   Eating too much.   Gelatin desserts.   Wash your hands to avoid spreading germs (bacteria, viruses).   Only take medicine as told by your doctor.   Keep all doctor visits as told.  GET HELP RIGHT AWAY IF:   You cannot drink something without throwing up.   You get worse even with treatment.   Your vomit has blood in it or looks greenish.   Your poop (stool) has blood in it or looks black and tarry.   You have not peed in 6 to 8 hours.   You pee a small amount of very dark pee.   You have a fever.   You pass out (faint).   You have belly (abdominal) pain that gets worse or stays in one spot (localizes).   You have a rash, stiff neck, or bad headache.   You get easily annoyed, sleepy, or are hard to wake up.   You feel weak, dizzy, or very thirsty.  MAKE SURE YOU:   Understand these instructions.   Will watch your condition.   Will get help right away if you are not doing well or get worse.  Document Released: 07/08/2009 Document Revised: 08/31/2011 Document Reviewed: 05/01/2011 [employer] Patient Information 2012 [employer]. 

## 2012-06-20 ENCOUNTER — Ambulatory Visit (HOSPITAL_BASED_OUTPATIENT_CLINIC_OR_DEPARTMENT_OTHER): Payer: BC Managed Care – PPO

## 2012-06-20 VITALS — BP 121/76 | HR 66 | Temp 98.2°F | Resp 18

## 2012-06-20 DIAGNOSIS — Z5189 Encounter for other specified aftercare: Secondary | ICD-10-CM

## 2012-06-20 DIAGNOSIS — E860 Dehydration: Secondary | ICD-10-CM

## 2012-06-20 DIAGNOSIS — C20 Malignant neoplasm of rectum: Secondary | ICD-10-CM

## 2012-06-20 MED ORDER — PEGFILGRASTIM INJECTION 6 MG/0.6ML
6.0000 mg | Freq: Once | SUBCUTANEOUS | Status: AC
  Administered 2012-06-20: 6 mg via SUBCUTANEOUS
  Filled 2012-06-20: qty 0.6

## 2012-06-20 MED ORDER — SODIUM CHLORIDE 0.9 % IV SOLN
INTRAVENOUS | Status: DC
  Administered 2012-06-20: 13:00:00 via INTRAVENOUS

## 2012-06-20 MED ORDER — SODIUM CHLORIDE 0.9 % IV SOLN: INTRAVENOUS | Status: DC

## 2012-06-20 NOTE — Patient Instructions (Addendum)
[facility] Discharge Instructions for Patients Receiving Chemotherapy  Today you received the following IV fluids and Neulasta.  To help prevent nausea and vomiting after your treatment, we encourage you to take your nausea medication as prescribed.   If you develop nausea and vomiting that is not controlled by your nausea medication, call the clinic. If it is after clinic hours your family physician or the after hours number for the clinic or go to the Emergency Department.   BELOW ARE SYMPTOMS THAT SHOULD BE REPORTED IMMEDIATELY:  *FEVER GREATER THAN 100.5 F  *CHILLS WITH OR WITHOUT FEVER  NAUSEA AND VOMITING THAT IS NOT CONTROLLED WITH YOUR NAUSEA MEDICATION  *UNUSUAL SHORTNESS OF BREATH  *UNUSUAL BRUISING OR BLEEDING  TENDERNESS IN MOUTH AND THROAT WITH OR WITHOUT PRESENCE OF ULCERS  *URINARY PROBLEMS  *BOWEL PROBLEMS  UNUSUAL RASH Items with * indicate a potential emergency and should be followed up as soon as possible.  One of the nurses will contact you 24 hours after your treatment. Please let the nurse know about any problems that you may have experienced. Feel free to call the clinic you have any questions or concerns. The clinic phone number is [phone].   I have been informed and understand all the instructions given to me. I know to contact the clinic, my physician, or go to the Emergency Department if any problems should occur. I do not have any questions at this time, but understand that I may call the clinic during office hours   should I have any questions or need assistance in obtaining follow up care.    __________________________________________  _____________  __________ Signature of Patient or Authorized Representative            Date                   Time    __________________________________________ Nurse's Signature

## 2012-06-21 ENCOUNTER — Ambulatory Visit (HOSPITAL_BASED_OUTPATIENT_CLINIC_OR_DEPARTMENT_OTHER): Payer: BC Managed Care – PPO

## 2012-06-21 ENCOUNTER — Other Ambulatory Visit: Payer: Self-pay | Admitting: Medical Oncology

## 2012-06-21 VITALS — BP 112/72 | HR 71 | Temp 97.9°F | Resp 18

## 2012-06-21 DIAGNOSIS — E860 Dehydration: Secondary | ICD-10-CM

## 2012-06-21 DIAGNOSIS — C20 Malignant neoplasm of rectum: Secondary | ICD-10-CM

## 2012-06-21 MED ORDER — SODIUM CHLORIDE 0.9 % IJ SOLN
10.0000 mL | INTRAMUSCULAR | Status: DC | PRN
  Administered 2012-06-21: 10 mL via INTRAVENOUS
  Filled 2012-06-21: qty 10

## 2012-06-21 MED ORDER — ACETAMINOPHEN 325 MG PO TABS
650.0000 mg | ORAL_TABLET | Freq: Once | ORAL | Status: AC
  Administered 2012-06-21: 650 mg via ORAL

## 2012-06-21 MED ORDER — SODIUM CHLORIDE 0.9 % IV SOLN
INTRAVENOUS | Status: DC
  Administered 2012-06-21: 12:00:00 via INTRAVENOUS

## 2012-06-21 MED ORDER — HEPARIN SOD (PORK) LOCK FLUSH 100 UNIT/ML IV SOLN
500.0000 [IU] | Freq: Once | INTRAVENOUS | Status: AC
  Administered 2012-06-21: 500 [IU] via INTRAVENOUS
  Filled 2012-06-21: qty 5

## 2012-06-21 NOTE — Patient Instructions (Addendum)
[facility] Discharge Instructions for Patients Receiving Chemotherapy  Today you received the following chemotherapy agents :   IV  Fluids.  To help prevent nausea and vomiting after your treatment, we encourage you to take your nausea medication as instructed by your physician, and take meds as needed for nausea.    If you develop nausea and vomiting that is not controlled by your nausea medication, call the clinic. If it is after clinic hours your family physician or the after hours number for the clinic or go to the Emergency Department.   BELOW ARE SYMPTOMS THAT SHOULD BE REPORTED IMMEDIATELY:  *FEVER GREATER THAN 100.5 F  *CHILLS WITH OR WITHOUT FEVER  NAUSEA AND VOMITING THAT IS NOT CONTROLLED WITH YOUR NAUSEA MEDICATION  *UNUSUAL SHORTNESS OF BREATH  *UNUSUAL BRUISING OR BLEEDING  TENDERNESS IN MOUTH AND THROAT WITH OR WITHOUT PRESENCE OF ULCERS  *URINARY PROBLEMS  *BOWEL PROBLEMS  UNUSUAL RASH Items with * indicate a potential emergency and should be followed up as soon as possible.  One of the nurses will contact you 24 hours after your treatment. Please let the nurse know about any problems that you may have experienced. Feel free to call the clinic you have any questions or concerns. The clinic phone number is [phone].   I have been informed and understand all the instructions given to me. I know to contact the clinic, my physician, or go to the Emergency Department if any problems should occur. I do not have any questions at this time, but understand that I may call the clinic during office hours   should I have any questions or need assistance in obtaining follow up care.    __________________________________________  _____________  __________ Signature of Patient or Authorized Representative            Date                   Time    __________________________________________ Nurse's Signature

## 2012-06-22 ENCOUNTER — Ambulatory Visit (HOSPITAL_BASED_OUTPATIENT_CLINIC_OR_DEPARTMENT_OTHER): Payer: BC Managed Care – PPO

## 2012-06-22 VITALS — BP 108/68 | HR 73 | Temp 98.3°F

## 2012-06-22 DIAGNOSIS — C20 Malignant neoplasm of rectum: Secondary | ICD-10-CM

## 2012-06-22 DIAGNOSIS — E860 Dehydration: Secondary | ICD-10-CM

## 2012-06-22 MED ORDER — SODIUM CHLORIDE 0.9 % IV SOLN
INTRAVENOUS | Status: DC
  Administered 2012-06-22: 10:00:00 via INTRAVENOUS

## 2012-06-22 MED ORDER — HEPARIN SOD (PORK) LOCK FLUSH 100 UNIT/ML IV SOLN
500.0000 [IU] | Freq: Once | INTRAVENOUS | Status: AC
  Administered 2012-06-22: 500 [IU] via INTRAVENOUS
  Filled 2012-06-22: qty 5

## 2012-06-22 MED ORDER — SODIUM CHLORIDE 0.9 % IJ SOLN
10.0000 mL | INTRAMUSCULAR | Status: DC | PRN
  Administered 2012-06-22: 10 mL via INTRAVENOUS
  Filled 2012-06-22: qty 10

## 2012-06-22 NOTE — Patient Instructions (Signed)
Call MD for problems 

## 2012-06-23 NOTE — Progress Notes (Signed)
OFFICE PROGRESS NOTE  CC: Renee Eva, MD Renee Herald, MD Renee Lint, MD Renee Nicholson., MD 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  PRIOR THERAPY:   #1 patient presented with a several month history of diarrhea and then subsequent bleeding. She was found to be anemic with a hemoglobin of 10. Because of this she went on to have a colonoscopy performed. The colonoscopy showed multiple sessile polyps in the cecum ascending transverse and sigmoid colon as well as the rectum. The rectal area showed a large exophytic rectal mass approximately 1 cm above the dentate line measuring 3-4 cm. She had a biopsy of this performed and was found to be an adenocarcinoma consistent with a rectal primary.  #2 patient was seen by Dr. Carlynn Nicholson at [facility] who performed a rectal ultrasound. The staging clinically was T3 N1. It was recommended by Dr. Lennart Nicholson the patient undergo neoadjuvant chemotherapy and radiation higher to her definitive surgery.  #3 Patient has completed concurrent radiation and chemotherapy. Her chemotherapy consisted of single agent xeloda administrated 08/22/11 - 10/09/2011. Overall she tolerated her treatment very well  #4 Has completed all of neoadjuvant treatment And the PET scan shows no evidence of distant metastasis and a para rectal peritoneal nodes less impressive she has had significant response to therapy.  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 APC is negative  #6 patient had a laparotomy with proctocolectomy, J-pouch with ileoanal anastomosis, loop ileostomy on 12/14/2011. The final pathology did not reveal any evidence of residual disease. Patient had a  complicated postop course including high output ileostomy. She required intensive IV fluids on an outpatient basis to prevent dehydration.  #7 patient began adjuvant chemotherapy consisting of XELOX starting 03/05/2012 on a 21 day cycle. A Total of 6 cycles were planned unfortunate patient was not able to tolerate this therapy due to side effects from the xeloda With development of hand-foot syndrome.She was not able to tolerate oxaliplatin due to significant toxicity from cold sensitivity. In that this treatment is being discontinued.  CURRENT THERAPY: s/pCycle 1 of FOLFIRI adjuvantly  INTERVAL HISTORY: Renee Nicholson 46 y.o. female returns for workin visit today.Patient was seen as a followup and then worked in. She had her husband call her and they were feeling very anxious about her diarrhea that started a few days ago after chemotherapy. She has had significant amount of diarrhea some nausea. Much she is not drinking as much. She denies any headaches. She is a little dehydrated. She has not any bleeding. She has not had any fevers or chills or night sweats. Remainder of the 10 point review of systems is negative. MEDICAL HISTORY: Past Medical History  Diagnosis Date  . Anemia   . Diarrhea   . Rectal cancer 08/16/2011  . Colon cancer   . Anxiety   . Bright red rectal bleeding 09/13/2011  . History of chemotherapy     completed 09/2011   . Hx of radiation therapy 08/29/11 to 10/09/11    rectum    ALLERGIES:   has no known allergies.  MEDICATIONS:  Current Outpatient Prescriptions  Medication Sig Dispense Refill  . acetaminophen (TYLENOL) 325 MG tablet Take 2 tablets (650 mg total) by  mouth every 4 (four) hours as needed.  200 tablet  1  . dexamethasone (DECADRON) 4 MG tablet Take 2 tablets by mouth two times a day starting the day after chemotherapy for 3 days. Take with food.  30 tablet  1  . diphenoxylate-atropine (LOMOTIL) 2.5-0.025 MG per tablet Take 1 tablet by mouth 4 (four) times  daily as needed for diarrhea or loose stools.  60 tablet  5  . feeding supplement (ENSURE COMPLETE) LIQD Take 237 mLs by mouth 3 (three) times daily between meals.  50 Bottle  6  . feeding supplement (RESOURCE BREEZE) LIQD Take 1 Container by mouth 3 (three) times daily between meals.  30 Container  6  . Ferrous Sulfate (IRON) 28 MG TABS Take 1 tablet by mouth daily.      . Heparin Lock Flush (HEPARIN FLUSH, PORCINE,) 100 UNIT/ML injection 1 mL (100 Units total) by Intracatheter route as needed (250 units per lumen).  5 Syringe  3  . HYDROcodone-acetaminophen (VICODIN) 5-500 MG per tablet Take 1-2 tablets by mouth every 6 (six) hours as needed for pain.  30 tablet  0  . lidocaine-prilocaine (EMLA) cream Apply topically as needed.  30 g  6  . loperamide (IMODIUM A-D) 2 MG tablet Take 2 tab at onset of diarrhea, then 1 tab q2h until 12 hr have passed without a BM.  May take 2 tab q4h qhs. If diarrhea recurs repeat.  60 tablet  4  . loperamide (IMODIUM) 2 MG capsule Take 2 capsules by mouth Every 4 hours as needed.      Marland Kitchen LORazepam (ATIVAN) 0.5 MG tablet Take 1 tablet (0.5 mg total) by mouth every 6 (six) hours as needed for anxiety.  30 tablet  0  . megestrol (MEGACE) 400 MG/10ML suspension Take 10 mLs (400 mg total) by mouth 2 (two) times daily.  240 mL  1  . Multiple Vitamin (MULTIVITAMIN) tablet Take 1 tablet by mouth every morning.       . ondansetron (ZOFRAN) 8 MG tablet Take 1 tablet two times a day starting the day after chemo for 3 days. Then take 1 tablet two times a day as needed for nausea or vomiting.  30 tablet  1  . pantoprazole (PROTONIX) 40 MG tablet Take 40 mg by mouth daily.      . polysaccharide iron (NIFEREX) 150 MG CAPS capsule Take 150 mg by mouth every morning.       . potassium chloride SA (K-DUR,KLOR-CON) 20 MEQ tablet Take 1 tablet (20 mEq total) by mouth daily.  5 tablet  0  . prochlorperazine (COMPAZINE) 10 MG tablet Take 1 tablet (10 mg total) by mouth every 6 (six) hours  as needed (Nausea or vomiting).  30 tablet  1  . prochlorperazine (COMPAZINE) 25 MG suppository Place 1 suppository (25 mg total) rectally every 12 (twelve) hours as needed for nausea.  12 suppository  3  . simethicone (MYLICON) 80 MG chewable tablet Chew 80 mg by mouth every 6 (six) hours as needed. For gas       No current facility-administered medications for this visit.   Facility-Administered Medications Ordered in Other Visits  Medication Dose Route Frequency Provider Last Rate Last Dose  . heparin lock flush 100 unit/mL  500 Units Intravenous Once Victorino December, MD      . sodium chloride 0.9 % injection 10 mL  10 mL Intravenous PRN Victorino December, MD        SURGICAL HISTORY:  Past Surgical History  Procedure Date  . Tubal ligation   . Carpal tunnel release   . Colonoscopy 07/26/2011    Procedure: COLONOSCOPY;  Surgeon: Arlyce Harman, MD;  Location: [facility];  Service: Endoscopy;  Laterality: N/A;  10:40  . Esophagogastroduodenoscopy 07/26/2011    Procedure: ESOPHAGOGASTRODUODENOSCOPY (EGD);  Surgeon: Arlyce Harman, MD;  Location: [facility];  Service: Endoscopy;  Laterality: N/A;  . Esophagogastroduodenoscopy 12/27/2011    Procedure: ESOPHAGOGASTRODUODENOSCOPY (EGD);  Surgeon: Shirley Friar, MD;  Location: [facility];  Service: Endoscopy;  Laterality: N/A;  . Laparoscopic colon resection 12/14/11    diverting ileostomy  . Portacath placement 02/21/2012    Procedure: INSERTION PORT-A-CATH;  Surgeon: Renee Lint, MD;  Location: [facility] OR;  Service: General;  Laterality: N/A;    REVIEW OF SYSTEMS:  Pertinent items are noted in HPI.   PHYSICAL EXAMINATION:   HEENT exam EOMI PERRLA sclerae anicteric no conjunctival pallor oral mucosa is moist neck is supple lungs are clear cardiovascular is regular rate rhythm no murmurs abdomen is soft nontender nondistended bowel sounds are present no HSM extremities no edema neuro is nonfocal ostomy bag with diarrheal stool no  blood   ECOG PERFORMANCE STATUS: 1  Blood pressure 93/63, pulse 73, temperature 98.4 F (36.9 C), temperature source Oral, resp. rate 20, height 4\' 9"  (1.448 m), weight 82 lb (37.195 kg), last menstrual period 02/20/2012.  LABORATORY DATA:     Chemistry      Component Value Date/Time   NA 140 06/18/2012 1031   NA 139 05/06/2012 1316   K 3.3* 06/18/2012 1031   K 3.3* 05/06/2012 1316   CL 104 06/18/2012 1031   CL 104 05/06/2012 1316   CO2 25 06/18/2012 1031   CO2 27 05/06/2012 1316   BUN 5.0* 06/18/2012 1031   BUN 8 05/06/2012 1316   CREATININE 0.6 06/18/2012 1031   CREATININE 0.70 05/06/2012 1316   CREATININE 0.70 07/27/2011 1306      Component Value Date/Time   CALCIUM 8.6 06/18/2012 1031   CALCIUM 9.0 05/06/2012 1316   ALKPHOS 139 06/18/2012 1031   ALKPHOS 81 05/06/2012 1316   AST 21 06/18/2012 1031   AST 55* 05/06/2012 1316   ALT 15 06/18/2012 1031   ALT 44* 05/06/2012 1316   BILITOT 0.50 06/18/2012 1031   BILITOT 0.7 05/06/2012 1316     ADDITIONAL INFORMATION: 1. Following placement in clearing solution, three more lymph nodes were identified, all of which are benign. Thus, the total lymph node count is 11. (JK:mw 12-18-11) 2. Following an exhaustive search, additional tissue has been submitted in an attempt to recover lymph nodes. Two more lymph nodes were identified, both benign. This brings the total number of lymph nodes recovered to 13, all of which are benign. (JBK:eps 12/26/11) FINAL DIAGNOSIS Diagnosis 1. Colon, resection margin (donut), distal rectal - COLORECTAL MUCOSA WITH FIBROSIS AND EDEMA, CONSISTENT WITH TREATMENT. - THERE IS NO EVIDENCE OF MALIGNANCY. 2. Colon, total resection (incl lymph nodes) - COLON WITH SCATTERED TUBULAR ADENOMAS. - HIGH GRADE DYSPLASIA IS NOT IDENTIFIED. - BENIGN APPENDIX WITH FIBROUS OBLITERATION OF THE TIP. - THERE IS NO EVIDENCE OF CARCINOMA IN 8 OF 8 LYMPH NODES (0/8). - SEE ONCOLOGY TABLE BELOW. 3. Colon, resection margin (donut), new  distal margin and rectum - BENIGN SQUAMOUS LINED MUCOSA. - THERE IS NO EVIDENCE OF MALIGNANCY. Microscopic Comment 2. COLON AND RECTUM Specimen: Colon, appendix, and terminal ileum. Procedure: Total colectomy. Tumor site: N/A Specimen integrity: Intact. Macroscopic  intactness of mesorectum: Complete. Macroscopic tumor perforation: N/A Invasive tumor: N/A 1 of 3 FINAL for Renee Nicholson, Renee Nicholson ([id number]) Microscopic Comment(continued) Microscopic extension of invasive tumor: N/A Lymph-Vascular invasion: N/A Peri-neural invasion: N/A Tumor deposit(s) (discontinuous extramural extension): N/A Resection margins: Negative for adenocarcinoma. Treatment effect (neoadjuvant therapy): Present, significant. Number of lymph nodes examined- 8 ; Number positive - 0 (PLEASE NOTE: Additional tissue will be submitted in an attempt to recover more lymph nodes) Additional polyp(s): Multiple scattered tubular adenomas. Pathologic Staging: ypT0, ypN0 Ancillary studies: N/A Comments: Grossly, there is a 1.5 cm granular depressed area of mucosa present at the distal portion of the specimen. This entire area has been submitted for histologic evaluation revealing inflamed, fibrotic, and edematous colorectal mucosa, consistent with treatment effect. Adenocarcinoma is not identified. In addition, there are scattered tubular adenomas throughout the specimen. No high grade dysplasia is identified. The case was discussed with Dr. Donell Beers on 12/15/2011. (JBK:gt, 12/15/11) Pecola Leisure MD Pathologist, Electronic Signature (Case signed 12/15/2011) Intraoper  RADIOGRAPHIC STUDIES:    ASSESSMENT: 46 year old female with:  #1. stage III adenocarcinoma of the rectum. Patient is now status post definitive surgery after having received neoadjuvant chemotherapy and radiation therapy. Her final pathology does not reveal any evidence of residual disease. 8 nodes were negative for metastatic disease.  #2 . Patient was  begun on adjuvant chemotherapy consisting of Xeloda and tape side of being. She does far has received one cycle. This course was complicated by development of a severe dehydration requiring hospitalization.  #3 Patient and I discussed rationale doing FOLFIRI. Risks and benefits of both 5-FU leucovorin and irinotecan were discussed with the patient. She understands literature was given to her.  A total of 4 cycles will be planned.   PLAN: #1 patient will proceed with IV hydration the remainder of the week including Saturday and then Monday.  #2 she will return in one week's time in followup. Certainly she can call us if she feels like she needs to be seen sooner.  All questions were answered. The patient knows to call the clinic with any problems, questions or concerns. We can certainly see the patient much sooner if necessary.  I spent 15 minutes counseling the patient face to face. The total time spent in the appointment was 30 minutes.    Drue Second, MD [facility] [phone] (beeper) [phone] (Office)

## 2012-06-24 ENCOUNTER — Other Ambulatory Visit: Payer: Self-pay | Admitting: Medical Oncology

## 2012-06-24 ENCOUNTER — Other Ambulatory Visit: Payer: Self-pay | Admitting: *Deleted

## 2012-06-24 ENCOUNTER — Ambulatory Visit (HOSPITAL_BASED_OUTPATIENT_CLINIC_OR_DEPARTMENT_OTHER): Payer: BC Managed Care – PPO

## 2012-06-24 VITALS — BP 110/70 | HR 69 | Temp 97.0°F | Resp 20

## 2012-06-24 DIAGNOSIS — Z23 Encounter for immunization: Secondary | ICD-10-CM

## 2012-06-24 DIAGNOSIS — Z5111 Encounter for antineoplastic chemotherapy: Secondary | ICD-10-CM

## 2012-06-24 DIAGNOSIS — C20 Malignant neoplasm of rectum: Secondary | ICD-10-CM

## 2012-06-24 MED ORDER — INFLUENZA VIRUS VACC SPLIT PF IM SUSP
0.5000 mL | Freq: Once | INTRAMUSCULAR | Status: AC
  Administered 2012-06-24: 0.5 mL via INTRAMUSCULAR
  Filled 2012-06-24: qty 0.5

## 2012-06-24 MED ORDER — INFLUENZA VIRUS VACC SPLIT PF IM SUSP: 0.5000 mL | INTRAMUSCULAR | Status: DC

## 2012-06-24 MED ORDER — SODIUM CHLORIDE 0.9 % IV SOLN: INTRAVENOUS | Status: DC

## 2012-06-24 MED ORDER — SODIUM CHLORIDE 0.9 % IV SOLN
INTRAVENOUS | Status: DC
  Administered 2012-06-24: 10:00:00 via INTRAVENOUS

## 2012-06-24 MED ORDER — LIDOCAINE-PRILOCAINE 2.5-2.5 % EX CREA: TOPICAL_CREAM | CUTANEOUS | Status: DC | PRN

## 2012-06-24 NOTE — Patient Instructions (Signed)
[facility] Discharge Instructions for Patients Receiving Chemotherapy  Today you received IV fluids.  To help prevent nausea and vomiting after your treatment, we encourage you to take your nausea medication as needed.  If you develop nausea and vomiting that is not controlled by your nausea medication, call the clinic. If it is after clinic hours your family physician or the after hours number for the clinic or go to the Emergency Department.   BELOW ARE SYMPTOMS THAT SHOULD BE REPORTED IMMEDIATELY:  *FEVER GREATER THAN 100.5 F  *CHILLS WITH OR WITHOUT FEVER  NAUSEA AND VOMITING THAT IS NOT CONTROLLED WITH YOUR NAUSEA MEDICATION  *UNUSUAL SHORTNESS OF BREATH  *UNUSUAL BRUISING OR BLEEDING  TENDERNESS IN MOUTH AND THROAT WITH OR WITHOUT PRESENCE OF ULCERS  *URINARY PROBLEMS  *BOWEL PROBLEMS  UNUSUAL RASH Items with * indicate a potential emergency and should be followed up as soon as possible.  One of the nurses will contact you 24 hours after your treatment. Please let the nurse know about any problems that you may have experienced. Feel free to call the clinic you have any questions or concerns. The clinic phone number is [phone].

## 2012-06-25 ENCOUNTER — Other Ambulatory Visit (HOSPITAL_BASED_OUTPATIENT_CLINIC_OR_DEPARTMENT_OTHER): Payer: BC Managed Care – PPO | Admitting: Lab

## 2012-06-25 ENCOUNTER — Ambulatory Visit: Payer: BC Managed Care – PPO

## 2012-06-25 ENCOUNTER — Telehealth: Payer: Self-pay | Admitting: *Deleted

## 2012-06-25 ENCOUNTER — Encounter: Payer: Self-pay | Admitting: Oncology

## 2012-06-25 ENCOUNTER — Ambulatory Visit (HOSPITAL_BASED_OUTPATIENT_CLINIC_OR_DEPARTMENT_OTHER): Payer: BC Managed Care – PPO | Admitting: Oncology

## 2012-06-25 VITALS — BP 102/64 | HR 69 | Temp 98.2°F | Resp 20 | Ht <= 58 in | Wt 80.2 lb

## 2012-06-25 DIAGNOSIS — R197 Diarrhea, unspecified: Secondary | ICD-10-CM

## 2012-06-25 DIAGNOSIS — R198 Other specified symptoms and signs involving the digestive system and abdomen: Secondary | ICD-10-CM

## 2012-06-25 DIAGNOSIS — C20 Malignant neoplasm of rectum: Secondary | ICD-10-CM

## 2012-06-25 DIAGNOSIS — E860 Dehydration: Secondary | ICD-10-CM

## 2012-06-25 LAB — CBC WITH DIFFERENTIAL/PLATELET
BASO%: 0.4 % (ref 0.0–2.0)
EOS%: 0.8 % (ref 0.0–7.0)
LYMPH%: 11.2 % — ABNORMAL LOW (ref 14.0–49.7)
MCHC: 34.2 g/dL (ref 31.5–36.0)
MCV: 91.5 fL (ref 79.5–101.0)
MONO%: 12.6 % (ref 0.0–14.0)
Platelets: 60 10*3/uL — ABNORMAL LOW (ref 145–400)
RBC: 3.09 10*6/uL — ABNORMAL LOW (ref 3.70–5.45)

## 2012-06-25 LAB — BASIC METABOLIC PANEL (CC13)
Calcium: 8.8 mg/dL (ref 8.4–10.4)
Sodium: 140 mEq/L (ref 136–145)

## 2012-06-25 NOTE — Telephone Encounter (Signed)
[name] email to set up patient's fluid appointments  Called patient confirmed over the phone the new date and times for 06-26-2012 thru 06-29-2012

## 2012-06-25 NOTE — Patient Instructions (Addendum)
Doing well continue lomotil for diarrhea  Return for next 4 days for IVF if you feel you do not need them then call and cancel

## 2012-06-25 NOTE — Progress Notes (Signed)
OFFICE PROGRESS NOTE  CC: [name], MD [name], MD [name], MD [name], MD [address] 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  PRIOR THERAPY:   #1 patient presented with a several month history of diarrhea and then subsequent bleeding. She was found to be anemic with a hemoglobin of 10. Because of this she went on to have a colonoscopy performed. The colonoscopy showed multiple sessile polyps in the cecum ascending transverse and sigmoid colon as well as the rectum. The rectal area showed a large exophytic rectal mass approximately 1 cm above the dentate line measuring 3-4 cm. She had a biopsy of this performed and was found to be an adenocarcinoma consistent with a rectal primary.  #2 patient was seen by Dr. [name] at [facility] who performed a rectal ultrasound. The staging clinically was T3 N1. It was recommended by Dr. [name] the patient undergo neoadjuvant chemotherapy and radiation higher to her definitive surgery.  #3 Patient has completed concurrent radiation and chemotherapy. Her chemotherapy consisted of single agent xeloda administrated 08/22/11 - 10/09/2011. Overall she tolerated her treatment very well  #4 Has completed all of neoadjuvant treatment And the PET scan shows no evidence of distant metastasis and a para rectal peritoneal nodes less impressive she has had significant response to therapy.  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 APC is negative  #6 patient had a laparotomy with proctocolectomy, J-pouch with ileoanal anastomosis, loop ileostomy on 12/14/2011. The final pathology did not reveal any evidence of residual disease. Patient had a  complicated postop course including high output ileostomy. She required intensive IV fluids on an outpatient basis to prevent dehydration.  #7 patient began adjuvant chemotherapy consisting of XELOX starting 03/05/2012 on a 21 day cycle. A Total of 6 cycles were planned unfortunate patient was not able to tolerate this therapy due to side effects from the xeloda With development of hand-foot syndrome.She was not able to tolerate oxaliplatin due to significant toxicity from cold sensitivity. In that this treatment is being discontinued.  CURRENT THERAPY: Cycle 2 of FOLFIRI adjuvantly  INTERVAL HISTORY: Jayana Mcinturff 46 y.o. female returns for followup visit today. She is continuing to have diarrhea and empties her back anywhere from 5-7 times in a 24 hour period.  She has not had any nausea and vomiting, and reports eating well.  She is experiencing body aches that are relieved with Tylenol.  She received her flu shot yesterday.  Otherwise she is feeling well and a 10 point review of systems is normal.  MEDICAL HISTORY: Past Medical History  Diagnosis Date  . Anemia   . Diarrhea   . Rectal cancer 08/16/2011  . Colon cancer   . Anxiety   . Bright red rectal bleeding 09/13/2011  . History of chemotherapy     completed 09/2011   . Hx of radiation therapy 08/29/11 to 10/09/11    rectum    ALLERGIES:   has no known allergies.  MEDICATIONS:  Current Outpatient Prescriptions  Medication Sig Dispense Refill  . acetaminophen (TYLENOL) 325 MG tablet Take 2 tablets (650 mg total) by mouth every 4 (four) hours as needed.  200 tablet  1  . dexamethasone (DECADRON) 4  MG tablet Take 2 tablets by mouth two times a day starting the day after chemotherapy for 3 days. Take with food.  30 tablet  1  . diphenoxylate-atropine (LOMOTIL) 2.5-0.025 MG per tablet Take 1 tablet by mouth 4 (four) times daily as needed for diarrhea or loose stools.  60 tablet  5  . feeding supplement (ENSURE COMPLETE) LIQD Take 237 mLs  by mouth 3 (three) times daily between meals.  50 Bottle  6  . feeding supplement (RESOURCE BREEZE) LIQD Take 1 Container by mouth 3 (three) times daily between meals.  30 Container  6  . Ferrous Sulfate (IRON) 28 MG TABS Take 1 tablet by mouth daily.      . Heparin Lock Flush (HEPARIN FLUSH, PORCINE,) 100 UNIT/ML injection 1 mL (100 Units total) by Intracatheter route as needed (250 units per lumen).  5 Syringe  3  . HYDROcodone-acetaminophen (VICODIN) 5-500 MG per tablet Take 1-2 tablets by mouth every 6 (six) hours as needed for pain.  30 tablet  0  . lidocaine-prilocaine (EMLA) cream Apply topically as needed.  30 g  6  . loperamide (IMODIUM A-D) 2 MG tablet Take 2 tab at onset of diarrhea, then 1 tab q2h until 12 hr have passed without a BM.  May take 2 tab q4h qhs. If diarrhea recurs repeat.  60 tablet  4  . loperamide (IMODIUM) 2 MG capsule Take 2 capsules by mouth Every 4 hours as needed.      Marland Kitchen LORazepam (ATIVAN) 0.5 MG tablet Take 1 tablet (0.5 mg total) by mouth every 6 (six) hours as needed for anxiety.  30 tablet  0  . megestrol (MEGACE) 400 MG/10ML suspension Take 10 mLs (400 mg total) by mouth 2 (two) times daily.  240 mL  1  . Multiple Vitamin (MULTIVITAMIN) tablet Take 1 tablet by mouth every morning.       . ondansetron (ZOFRAN) 8 MG tablet Take 1 tablet two times a day starting the day after chemo for 3 days. Then take 1 tablet two times a day as needed for nausea or vomiting.  30 tablet  1  . pantoprazole (PROTONIX) 40 MG tablet Take 40 mg by mouth daily.      . polysaccharide iron (NIFEREX) 150 MG CAPS capsule Take 150 mg by mouth every morning.       . potassium chloride SA (K-DUR,KLOR-CON) 20 MEQ tablet Take 1 tablet (20 mEq total) by mouth daily.  5 tablet  0  . prochlorperazine (COMPAZINE) 10 MG tablet Take 1 tablet (10 mg total) by mouth every 6 (six) hours as needed (Nausea or vomiting).  30 tablet  1  . prochlorperazine (COMPAZINE) 25 MG suppository Place 1 suppository (25  mg total) rectally every 12 (twelve) hours as needed for nausea.  12 suppository  3  . simethicone (MYLICON) 80 MG chewable tablet Chew 80 mg by mouth every 6 (six) hours as needed. For gas       No current facility-administered medications for this visit.   Facility-Administered Medications Ordered in Other Visits  Medication Dose Route Frequency Provider Last Rate Last Dose  . heparin lock flush 100 unit/mL  500 Units Intravenous Once Victorino December, MD      . sodium chloride 0.9 % injection 10 mL  10 mL Intravenous PRN Victorino December, MD      . DISCONTD: 0.9 %  sodium chloride infusion   Intravenous Continuous Victorino December, MD  SURGICAL HISTORY:  Past Surgical History  Procedure Date  . Tubal ligation   . Carpal tunnel release   . Colonoscopy 07/26/2011    Procedure: COLONOSCOPY;  Surgeon: Arlyce Harman, MD;  Location: [facility];  Service: Endoscopy;  Laterality: N/A;  10:40  . Esophagogastroduodenoscopy 07/26/2011    Procedure: ESOPHAGOGASTRODUODENOSCOPY (EGD);  Surgeon: Arlyce Harman, MD;  Location: [facility];  Service: Endoscopy;  Laterality: N/A;  . Esophagogastroduodenoscopy 12/27/2011    Procedure: ESOPHAGOGASTRODUODENOSCOPY (EGD);  Surgeon: Shirley Friar, MD;  Location: [facility];  Service: Endoscopy;  Laterality: N/A;  . Laparoscopic colon resection 12/14/11    diverting ileostomy  . Portacath placement 02/21/2012    Procedure: INSERTION PORT-A-CATH;  Surgeon: [name], MD;  Location: [facility] OR;  Service: General;  Laterality: N/A;    REVIEW OF SYSTEMS:  Pertinent items are noted in HPI.   PHYSICAL EXAMINATION:  Gen.: Patient is a thin female but in no acute distress HEENT: PERRLA sclerae anicteric no conjunctival pallor oral mucosa is moist no thrush Neck: supple, no palpable adenopathy Lungs: clear to auscultation, no wheezes, rhonchi, or rales Cardiovascular: regular rate rhythm, S1, S2, no murmurs, rubs or gallops Abdomen: Soft, non-tender,  non-distended, normoactive bowel sounds, no HSM Stoma site is pink Extremities: warm and well perfused, no clubbing, cyanosis, or edema Skin: No rashes or lesions ECOG PERFORMANCE STATUS: 1 - Symptomatic but completely ambulatory   Blood pressure 102/64, pulse 69, temperature 98.2 F (36.8 C), temperature source Oral, resp. rate 20, height 4\' 9"  (1.448 m), weight 80 lb 3.2 oz (36.378 kg), last menstrual period 02/20/2012.  LABORATORY DATA: Lab Results  Component Value Date   WBC 10.2 06/25/2012   HGB 9.7* 06/25/2012   HCT 28.3* 06/25/2012   MCV 91.5 06/25/2012   PLT 60* 06/25/2012      Chemistry      Component Value Date/Time   NA 140 06/18/2012 1031   NA 139 05/06/2012 1316   K 3.3* 06/18/2012 1031   K 3.3* 05/06/2012 1316   CL 104 06/18/2012 1031   CL 104 05/06/2012 1316   CO2 25 06/18/2012 1031   CO2 27 05/06/2012 1316   BUN 5.0* 06/18/2012 1031   BUN 8 05/06/2012 1316   CREATININE 0.6 06/18/2012 1031   CREATININE 0.70 05/06/2012 1316   CREATININE 0.70 07/27/2011 1306      Component Value Date/Time   CALCIUM 8.6 06/18/2012 1031   CALCIUM 9.0 05/06/2012 1316   ALKPHOS 139 06/18/2012 1031   ALKPHOS 81 05/06/2012 1316   AST 21 06/18/2012 1031   AST 55* 05/06/2012 1316   ALT 15 06/18/2012 1031   ALT 44* 05/06/2012 1316   BILITOT 0.50 06/18/2012 1031   BILITOT 0.7 05/06/2012 1316     ADDITIONAL INFORMATION: 1. Following placement in clearing solution, three more lymph nodes were identified, all of which are benign. Thus, the total lymph node count is 11. (JK:mw 12-18-11) 2. Following an exhaustive search, additional tissue has been submitted in an attempt to recover lymph nodes. Two more lymph nodes were identified, both benign. This brings the total number of lymph nodes recovered to 13, all of which are benign. (JBK:eps 12/26/11) FINAL DIAGNOSIS Diagnosis 1. Colon, resection margin (donut), distal rectal - COLORECTAL MUCOSA WITH FIBROSIS AND EDEMA, CONSISTENT WITH TREATMENT. - THERE IS  NO EVIDENCE OF MALIGNANCY. 2. Colon, total resection (incl lymph nodes) - COLON WITH SCATTERED TUBULAR ADENOMAS. - HIGH GRADE DYSPLASIA IS NOT IDENTIFIED. - BENIGN APPENDIX WITH FIBROUS  OBLITERATION OF THE TIP. - THERE IS NO EVIDENCE OF CARCINOMA IN 8 OF 8 LYMPH NODES (0/8). - SEE ONCOLOGY TABLE BELOW. 3. Colon, resection margin (donut), new distal margin and rectum - BENIGN SQUAMOUS LINED MUCOSA. - THERE IS NO EVIDENCE OF MALIGNANCY. Microscopic Comment 2. COLON AND RECTUM Specimen: Colon, appendix, and terminal ileum. Procedure: Total colectomy. Tumor site: N/A Specimen integrity: Intact. Macroscopic intactness of mesorectum: Complete. Macroscopic tumor perforation: N/A Invasive tumor: N/A 1 of 3 FINAL for CHETARA, KROPP ([id number]) Microscopic Comment(continued) Microscopic extension of invasive tumor: N/A Lymph-Vascular invasion: N/A Peri-neural invasion: N/A Tumor deposit(s) (discontinuous extramural extension): N/A Resection margins: Negative for adenocarcinoma. Treatment effect (neoadjuvant therapy): Present, significant. Number of lymph nodes examined- 8 ; Number positive - 0 (PLEASE NOTE: Additional tissue will be submitted in an attempt to recover more lymph nodes) Additional polyp(s): Multiple scattered tubular adenomas. Pathologic Staging: ypT0, ypN0 Ancillary studies: N/A Comments: Grossly, there is a 1.5 cm granular depressed area of mucosa present at the distal portion of the specimen. This entire area has been submitted for histologic evaluation revealing inflamed, fibrotic, and edematous colorectal mucosa, consistent with treatment effect. Adenocarcinoma is not identified. In addition, there are scattered tubular adenomas throughout the specimen. No high grade dysplasia is identified. The case was discussed with Dr. Donell Beers on 12/15/2011. (JBK:gt, 12/15/11) Pecola Leisure MD Pathologist, Electronic Signature (Case signed 12/15/2011) Intraoper   ASSESSMENT:  46 year old female with:  #1. stage III adenocarcinoma of the rectum. Patient is now status post definitive surgery after having received neoadjuvant chemotherapy and radiation therapy. Her final pathology does not reveal any evidence of residual disease. 8 nodes were negative for metastatic disease.  #2 . Patient was begun on adjuvant chemotherapy consisting of Xeloda and Oxaliplatin. She received one cycle. This course was complicated by development of a severe dehydration requiring hospitalization.  #3 Patient and I discussed rationale doing FOLFIRI. Risks and benefits of both 5-FU leucovorin and irinotecan were discussed with the patient. She understands literature was given to her.  A total of 4 cycles will be planned.  She received her first cycle on    PLAN: #1 Pt is doing well today.  Reinforced foods to avoid for her diarrhea, and to continue her Lomotil.    #2 She does not feel like she needs IVF today.  She has orders and appointments in for the next four days in case she needs them.  Will see her next week before her chemotherapy.    All questions were answered. The patient knows to call the clinic with any problems, questions or concerns. We can certainly see the patient much sooner if necessary.  I spent >25 minutes counseling the patient face to face. The total time spent in the appointment was 30 minutes.    Cherie Ouch Lyn Hollingshead, NP Medical Oncology [facility] Phone: [phone]   06/25/2012, 1:16 PM    ATTENDING'S ATTESTATION:  I personally reviewed patient's chart, examined patient myself, formulated the treatment plan as above.  Drue Second, MD Medical/Oncology [facility] [phone] (beeper) [phone] (Office)  06/25/2012, 3:06 PM

## 2012-06-26 ENCOUNTER — Other Ambulatory Visit: Payer: Self-pay | Admitting: *Deleted

## 2012-06-26 ENCOUNTER — Ambulatory Visit (HOSPITAL_BASED_OUTPATIENT_CLINIC_OR_DEPARTMENT_OTHER): Payer: BC Managed Care – PPO

## 2012-06-26 DIAGNOSIS — E860 Dehydration: Secondary | ICD-10-CM

## 2012-06-26 MED ORDER — SODIUM CHLORIDE 0.9 % IV SOLN
INTRAVENOUS | Status: DC
  Administered 2012-06-26: 10:00:00 via INTRAVENOUS

## 2012-06-26 NOTE — Telephone Encounter (Signed)
Message copied by Cooper Render on Wed Jun 26, 2012  4:22 PM ------      Message from: Victorino December      Created: Tue Jun 25, 2012  3:37 PM       Call patient: K-dur 20 meg po daily x 10 days #30 / 6 refilles

## 2012-06-26 NOTE — Patient Instructions (Signed)
[facility] Discharge Instructions for Patients Receiving Chemotherapy  Today you received IV fluids.  To help prevent nausea and vomiting after your treatment, we encourage you to take your nausea medication. Begin taking your nausea medication as often as prescribed for by Dr. Welton Flakes.    If you develop nausea and vomiting that is not controlled by your nausea medication, call the clinic. If it is after clinic hours your family physician or the after hours number for the clinic or go to the Emergency Department.   BELOW ARE SYMPTOMS THAT SHOULD BE REPORTED IMMEDIATELY:  *FEVER GREATER THAN 100.5 F  *CHILLS WITH OR WITHOUT FEVER  NAUSEA AND VOMITING THAT IS NOT CONTROLLED WITH YOUR NAUSEA MEDICATION  *UNUSUAL SHORTNESS OF BREATH  *UNUSUAL BRUISING OR BLEEDING  TENDERNESS IN MOUTH AND THROAT WITH OR WITHOUT PRESENCE OF ULCERS  *URINARY PROBLEMS  *BOWEL PROBLEMS  UNUSUAL RASH Items with * indicate a potential emergency and should be followed up as soon as possible.  One of the nurses will contact you 24 hours after your treatment. Please let the nurse know about any problems that you may have experienced. Feel free to call the clinic you have any questions or concerns. The clinic phone number is [phone].   I have been informed and understand all the instructions given to me. I know to contact the clinic, my physician, or go to the Emergency Department if any problems should occur. I do not have any questions at this time, but understand that I may call the clinic during office hours   should I have any questions or need assistance in obtaining follow up care.    __________________________________________  _____________  __________ Signature of Patient or Authorized Representative            Date                   Time    __________________________________________ Nurse's Signature

## 2012-06-27 ENCOUNTER — Ambulatory Visit: Payer: BC Managed Care – PPO

## 2012-06-27 ENCOUNTER — Ambulatory Visit (INDEPENDENT_AMBULATORY_CARE_PROVIDER_SITE_OTHER): Payer: BC Managed Care – PPO | Admitting: General Surgery

## 2012-06-27 VITALS — BP 112/69 | HR 76 | Temp 97.9°F | Resp 18

## 2012-06-27 VITALS — BP 118/78 | HR 84 | Temp 98.1°F | Resp 18 | Ht <= 58 in | Wt 78.6 lb

## 2012-06-27 DIAGNOSIS — R198 Other specified symptoms and signs involving the digestive system and abdomen: Secondary | ICD-10-CM

## 2012-06-27 DIAGNOSIS — C20 Malignant neoplasm of rectum: Secondary | ICD-10-CM

## 2012-06-27 MED ORDER — SODIUM CHLORIDE 0.9 % IV SOLN
1000.0000 mL | Freq: Once | INTRAVENOUS | Status: AC
  Administered 2012-06-27: 1000 mL via INTRAVENOUS

## 2012-06-27 MED ORDER — POTASSIUM CHLORIDE CRYS ER 20 MEQ PO TBCR: 20.0000 meq | EXTENDED_RELEASE_TABLET | Freq: Every day | ORAL | Status: DC

## 2012-06-27 NOTE — Patient Instructions (Signed)
Ok to have a little drainage.  Follow up in 2 months.

## 2012-06-27 NOTE — Progress Notes (Signed)
HISTORY: Pt undergoing chemotherapy.  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  She is concerned about some drainage from her bottom that occurs at night.  She is having some diarrhea with the irinotecan.  She does feel weak.     PERTINENT REVIEW OF SYSTEMS: Otherwise negative.     EXAM: Head: Normocephalic and atraumatic.  Very thin.    Eyes:  Conjunctivae are normal. Pupils are equal, round, and reactive to light. No scleral icterus.  Neck:  Normal range of motion. Neck supple. No tracheal deviation present. No thyromegaly present.  Resp: No respiratory distress, normal effort. Abd:  Abdomen is soft, non distended and non tender. No masses are palpable.  There is no rebound and no guarding. Ostomy is pink.  Watery output in bag.   Neurological: Alert and oriented to person, place, and time. Coordination normal.  Skin: Skin is warm and dry. No rash noted. No diaphoretic. No erythema. No pallor.  Psychiatric: Normal mood and affect. Normal behavior. Judgment and thought content normal.      ASSESSMENT AND PLAN:   Rectal cancer s/p proctocolectomy, j-pouch with ileoanal anastomosis, loop ileostomy on 12/14/11 Doing well overall.    Follow up in 2 months.  Will need more strength to undergo takedown.  Will also need to investigate pouch for leak.        Maudry Diego, MD Surgical Oncology, General & Endocrine Surgery [facility]  [name], MD [name], MD

## 2012-06-27 NOTE — Assessment & Plan Note (Signed)
Doing well overall.    Follow up in 2 months.  Will need more strength to undergo takedown.  Will also need to investigate pouch for leak.

## 2012-06-27 NOTE — Telephone Encounter (Signed)
Per MD, notified pt to begin K-dur 20mg  po daily x 10 days. Pt verbalized understanding.

## 2012-06-27 NOTE — Patient Instructions (Addendum)
[facility] Discharge Instructions for Patients Receiving Fluids Today you received the following chemotherapy agents Normal Saline  To help prevent nausea and vomiting after your treatment, we encourage you to take your nausea medication as directed   If you develop nausea and vomiting that is not controlled by your nausea medication, call the clinic. If it is after clinic hours your family physician or the after hours number for the clinic or go to the Emergency Department.   BELOW ARE SYMPTOMS THAT SHOULD BE REPORTED IMMEDIATELY:  *FEVER GREATER THAN 100.5 F  *CHILLS WITH OR WITHOUT FEVER  NAUSEA AND VOMITING THAT IS NOT CONTROLLED WITH YOUR NAUSEA MEDICATION  *UNUSUAL SHORTNESS OF BREATH  *UNUSUAL BRUISING OR BLEEDING  TENDERNESS IN MOUTH AND THROAT WITH OR WITHOUT PRESENCE OF ULCERS  *URINARY PROBLEMS  *BOWEL PROBLEMS  UNUSUAL RASH Items with * indicate a potential emergency and should be followed up as soon as possible.   Feel free to call the clinic you have any questions or concerns. The clinic phone number is [phone].   I have been informed and understand all the instructions given to me. I know to contact the clinic, my physician, or go to the Emergency Department if any problems should occur. I do not have any questions at this time, but understand that I may call the clinic during office hours   should I have any questions or need assistance in obtaining follow up care.    __________________________________________  _____________  __________ Signature of Patient or Authorized Representative            Date                   Time    __________________________________________ Nurse's Signature

## 2012-06-28 ENCOUNTER — Ambulatory Visit: Payer: BC Managed Care – PPO

## 2012-06-29 ENCOUNTER — Ambulatory Visit (HOSPITAL_BASED_OUTPATIENT_CLINIC_OR_DEPARTMENT_OTHER): Payer: BC Managed Care – PPO

## 2012-06-29 DIAGNOSIS — E860 Dehydration: Secondary | ICD-10-CM

## 2012-06-29 MED ORDER — SODIUM CHLORIDE 0.9 % IV SOLN
INTRAVENOUS | Status: DC
  Administered 2012-06-29: 10:00:00 via INTRAVENOUS

## 2012-06-29 NOTE — Patient Instructions (Addendum)
[facility] Discharge Instructions for Patients Receiving Chemotherapy  Today you received IV fluids.  To help prevent nausea and vomiting after your treatment, we encourage you to take your nausea medication. Begin taking your nausea medication as often as prescribed for by Dr. Khan.    If you develop nausea and vomiting that is not controlled by your nausea medication, call the clinic. If it is after clinic hours your family physician or the after hours number for the clinic or go to the Emergency Department.   BELOW ARE SYMPTOMS THAT SHOULD BE REPORTED IMMEDIATELY:  *FEVER GREATER THAN 100.5 F  *CHILLS WITH OR WITHOUT FEVER  NAUSEA AND VOMITING THAT IS NOT CONTROLLED WITH YOUR NAUSEA MEDICATION  *UNUSUAL SHORTNESS OF BREATH  *UNUSUAL BRUISING OR BLEEDING  TENDERNESS IN MOUTH AND THROAT WITH OR WITHOUT PRESENCE OF ULCERS  *URINARY PROBLEMS  *BOWEL PROBLEMS  UNUSUAL RASH Items with * indicate a potential emergency and should be followed up as soon as possible.  One of the nurses will contact you 24 hours after your treatment. Please let the nurse know about any problems that you may have experienced. Feel free to call the clinic you have any questions or concerns. The clinic phone number is [phone].   I have been informed and understand all the instructions given to me. I know to contact the clinic, my physician, or go to the Emergency Department if any problems should occur. I do not have any questions at this time, but understand that I may call the clinic during office hours   should I have any questions or need assistance in obtaining follow up care.    __________________________________________  _____________  __________ Signature of Patient or Authorized Representative            Date                   Time    __________________________________________ Nurse's Signature    

## 2012-07-02 ENCOUNTER — Encounter: Payer: Self-pay | Admitting: Adult Health

## 2012-07-02 ENCOUNTER — Ambulatory Visit (HOSPITAL_BASED_OUTPATIENT_CLINIC_OR_DEPARTMENT_OTHER): Payer: BC Managed Care – PPO

## 2012-07-02 ENCOUNTER — Other Ambulatory Visit (HOSPITAL_BASED_OUTPATIENT_CLINIC_OR_DEPARTMENT_OTHER): Payer: BC Managed Care – PPO | Admitting: Lab

## 2012-07-02 ENCOUNTER — Ambulatory Visit (HOSPITAL_BASED_OUTPATIENT_CLINIC_OR_DEPARTMENT_OTHER): Payer: BC Managed Care – PPO | Admitting: Adult Health

## 2012-07-02 ENCOUNTER — Telehealth: Payer: Self-pay | Admitting: *Deleted

## 2012-07-02 VITALS — BP 99/66 | HR 84 | Temp 98.5°F | Resp 20 | Ht <= 58 in | Wt 79.1 lb

## 2012-07-02 DIAGNOSIS — R197 Diarrhea, unspecified: Secondary | ICD-10-CM

## 2012-07-02 DIAGNOSIS — C20 Malignant neoplasm of rectum: Secondary | ICD-10-CM

## 2012-07-02 DIAGNOSIS — R198 Other specified symptoms and signs involving the digestive system and abdomen: Secondary | ICD-10-CM

## 2012-07-02 DIAGNOSIS — E860 Dehydration: Secondary | ICD-10-CM

## 2012-07-02 DIAGNOSIS — R110 Nausea: Secondary | ICD-10-CM

## 2012-07-02 DIAGNOSIS — Z5111 Encounter for antineoplastic chemotherapy: Secondary | ICD-10-CM

## 2012-07-02 LAB — CBC WITH DIFFERENTIAL/PLATELET
BASO%: 0.4 % (ref 0.0–2.0)
EOS%: 1.6 % (ref 0.0–7.0)
HCT: 33.9 % — ABNORMAL LOW (ref 34.8–46.6)
LYMPH%: 11 % — ABNORMAL LOW (ref 14.0–49.7)
MCH: 30.4 pg (ref 25.1–34.0)
MCHC: 33.3 g/dL (ref 31.5–36.0)
NEUT%: 79.5 % — ABNORMAL HIGH (ref 38.4–76.8)
Platelets: 185 10*3/uL (ref 145–400)
RBC: 3.72 10*6/uL (ref 3.70–5.45)
WBC: 14 10*3/uL — ABNORMAL HIGH (ref 3.9–10.3)
lymph#: 1.5 10*3/uL (ref 0.9–3.3)
nRBC: 0 % (ref 0–0)

## 2012-07-02 LAB — COMPREHENSIVE METABOLIC PANEL (CC13)
Albumin: 3.9 g/dL (ref 3.5–5.0)
BUN: 11 mg/dL (ref 7.0–26.0)
CO2: 24 mEq/L (ref 22–29)
Calcium: 9.2 mg/dL (ref 8.4–10.4)
Chloride: 102 mEq/L (ref 98–107)
Creatinine: 0.8 mg/dL (ref 0.6–1.1)
Glucose: 126 mg/dl — ABNORMAL HIGH (ref 70–99)
Potassium: 4.3 mEq/L (ref 3.5–5.1)

## 2012-07-02 MED ORDER — SODIUM CHLORIDE 0.9 % IJ SOLN
10.0000 mL | INTRAMUSCULAR | Status: DC | PRN
  Filled 2012-07-02: qty 10

## 2012-07-02 MED ORDER — DEXAMETHASONE SODIUM PHOSPHATE 4 MG/ML IJ SOLN
20.0000 mg | Freq: Once | INTRAMUSCULAR | Status: AC
  Administered 2012-07-02: 20 mg via INTRAVENOUS

## 2012-07-02 MED ORDER — SODIUM CHLORIDE 0.9 % IV SOLN
Freq: Once | INTRAVENOUS | Status: AC
  Administered 2012-07-02: 12:00:00 via INTRAVENOUS

## 2012-07-02 MED ORDER — SODIUM CHLORIDE 0.9 % IV SOLN
2400.0000 mg/m2 | INTRAVENOUS | Status: DC
  Administered 2012-07-02: 2950 mg via INTRAVENOUS
  Filled 2012-07-02: qty 59

## 2012-07-02 MED ORDER — PROMETHAZINE HCL 25 MG/ML IJ SOLN
6.2500 mg | Freq: Once | INTRAMUSCULAR | Status: AC
  Administered 2012-07-02: 6.25 mg via INTRAVENOUS
  Filled 2012-07-02: qty 1

## 2012-07-02 MED ORDER — ATROPINE SULFATE 0.4 MG/ML IJ SOLN
0.5000 mg | Freq: Once | INTRAMUSCULAR | Status: AC | PRN
  Administered 2012-07-02: 0.5 mg via INTRAVENOUS
  Filled 2012-07-02: qty 1.25

## 2012-07-02 MED ORDER — IRINOTECAN HCL CHEMO INJECTION 100 MG/5ML
220.0000 mg | Freq: Once | INTRAVENOUS | Status: AC
  Administered 2012-07-02: 220 mg via INTRAVENOUS
  Filled 2012-07-02: qty 11

## 2012-07-02 MED ORDER — DEXTROSE 5 % IV SOLN
500.0000 mg | Freq: Once | INTRAVENOUS | Status: AC
  Administered 2012-07-02: 500 mg via INTRAVENOUS
  Filled 2012-07-02: qty 25

## 2012-07-02 MED ORDER — ONDANSETRON 16 MG/50ML IVPB (CHCC)
16.0000 mg | Freq: Once | INTRAVENOUS | Status: AC
  Administered 2012-07-02: 16 mg via INTRAVENOUS

## 2012-07-02 MED ORDER — FLUOROURACIL CHEMO INJECTION 500 MG/10ML
400.0000 mg/m2 | Freq: Once | INTRAVENOUS | Status: AC
  Administered 2012-07-02: 500 mg via INTRAVENOUS
  Filled 2012-07-02: qty 10

## 2012-07-02 NOTE — Progress Notes (Signed)
OFFICE PROGRESS NOTE  CC: [name], MD [name], MD [name], MD [name], MD [address] 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  PRIOR THERAPY:   #1 patient presented with a several month history of diarrhea and then subsequent bleeding. She was found to be anemic with a hemoglobin of 10. Because of this she went on to have a colonoscopy performed. The colonoscopy showed multiple sessile polyps in the cecum ascending transverse and sigmoid colon as well as the rectum. The rectal area showed a large exophytic rectal mass approximately 1 cm above the dentate line measuring 3-4 cm. She had a biopsy of this performed and was found to be an adenocarcinoma consistent with a rectal primary.  #2 patient was seen by Dr. [name] at [facility] who performed a rectal ultrasound. The staging clinically was T3 N1. It was recommended by Dr. [name] the patient undergo neoadjuvant chemotherapy and radiation higher to her definitive surgery.  #3 Patient has completed concurrent radiation and chemotherapy. Her chemotherapy consisted of single agent xeloda administrated 08/22/11 - 10/09/2011. Overall she tolerated her treatment very well  #4 Has completed all of neoadjuvant treatment And the PET scan shows no evidence of distant metastasis and a para rectal peritoneal nodes less impressive she has had significant response to therapy.  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 APC is negative  #6 patient had a laparotomy with proctocolectomy, J-pouch with ileoanal anastomosis, loop ileostomy on 12/14/2011. The final pathology did not reveal any evidence of residual disease. Patient had a  complicated postop course including high output ileostomy. She required intensive IV fluids on an outpatient basis to prevent dehydration.  #7 patient began adjuvant chemotherapy consisting of XELOX starting 03/05/2012 on a 21 day cycle. A Total of 6 cycles were planned unfortunate patient was not able to tolerate this therapy due to side effects from the xeloda With development of hand-foot syndrome.She was not able to tolerate oxaliplatin due to significant toxicity from cold sensitivity. In that this treatment is being discontinued.  CURRENT THERAPY: Cycle 2 of FOLFIRI adjuvantly  INTERVAL HISTORY: Jayana Dehner 46 y.o. female returns for followup visit today. She is continuing to have diarrhea and empties her back anywhere from 5-7 times in a 24 hour period.  She has not had any nausea and vomiting, and reports eating well. She has lost about 1 pound since her last visit. But she is eating she denies any nausea  Otherwise she is feeling well and a 10 point review of systems is normal.  MEDICAL HISTORY: Past Medical History  Diagnosis Date  . Anemia   . Diarrhea   . Rectal cancer 08/16/2011  . Colon cancer   . Anxiety   . Bright red rectal bleeding 09/13/2011  . History of chemotherapy     completed 09/2011   . Hx of radiation therapy 08/29/11 to 10/09/11    rectum    ALLERGIES:   has no known allergies.  MEDICATIONS:  Current Outpatient Prescriptions  Medication Sig Dispense Refill  . acetaminophen (TYLENOL) 325 MG tablet Take 2 tablets (650 mg total) by mouth every 4 (four) hours as needed.  200 tablet  1  . dexamethasone (DECADRON) 4  MG tablet Take 2 tablets by mouth two times a day starting the day after chemotherapy for 3 days. Take with food.  30 tablet  1  . diphenoxylate-atropine (LOMOTIL) 2.5-0.025 MG per tablet Take 1 tablet by mouth 4 (four) times daily as needed for diarrhea or loose stools.  60 tablet  5  . feeding supplement (ENSURE COMPLETE) LIQD Take 237 mLs by mouth 3  (three) times daily between meals.  50 Bottle  6  . feeding supplement (RESOURCE BREEZE) LIQD Take 1 Container by mouth 3 (three) times daily between meals.  30 Container  6  . Ferrous Sulfate (IRON) 28 MG TABS Take 1 tablet by mouth daily.      . Heparin Lock Flush (HEPARIN FLUSH, PORCINE,) 100 UNIT/ML injection 1 mL (100 Units total) by Intracatheter route as needed (250 units per lumen).  5 Syringe  3  . HYDROcodone-acetaminophen (VICODIN) 5-500 MG per tablet Take 1-2 tablets by mouth every 6 (six) hours as needed for pain.  30 tablet  0  . lidocaine-prilocaine (EMLA) cream Apply topically as needed.  30 g  6  . loperamide (IMODIUM A-D) 2 MG tablet Take 2 tab at onset of diarrhea, then 1 tab q2h until 12 hr have passed without a BM.  May take 2 tab q4h qhs. If diarrhea recurs repeat.  60 tablet  4  . loperamide (IMODIUM) 2 MG capsule Take 2 capsules by mouth Every 4 hours as needed.      Marland Kitchen LORazepam (ATIVAN) 0.5 MG tablet Take 1 tablet (0.5 mg total) by mouth every 6 (six) hours as needed for anxiety.  30 tablet  0  . megestrol (MEGACE) 400 MG/10ML suspension Take 10 mLs (400 mg total) by mouth 2 (two) times daily.  240 mL  1  . Multiple Vitamin (MULTIVITAMIN) tablet Take 1 tablet by mouth every morning.       . ondansetron (ZOFRAN) 8 MG tablet Take 1 tablet two times a day starting the day after chemo for 3 days. Then take 1 tablet two times a day as needed for nausea or vomiting.  30 tablet  1  . pantoprazole (PROTONIX) 40 MG tablet Take 40 mg by mouth daily.      . polysaccharide iron (NIFEREX) 150 MG CAPS capsule Take 150 mg by mouth every morning.       . potassium chloride SA (K-DUR,KLOR-CON) 20 MEQ tablet Take 1 tablet (20 mEq total) by mouth daily.  30 tablet  6  . prochlorperazine (COMPAZINE) 10 MG tablet Take 1 tablet (10 mg total) by mouth every 6 (six) hours as needed (Nausea or vomiting).  30 tablet  1  . prochlorperazine (COMPAZINE) 25 MG suppository Place 1 suppository (25 mg total)  rectally every 12 (twelve) hours as needed for nausea.  12 suppository  3  . simethicone (MYLICON) 80 MG chewable tablet Chew 80 mg by mouth every 6 (six) hours as needed. For gas       Current Facility-Administered Medications  Medication Dose Route Frequency Provider Last Rate Last Dose  . promethazine (PHENERGAN) injection 6.25 mg  6.25 mg Intravenous Once Augustin Schooling, NP   6.25 mg at 07/02/12 1602   Facility-Administered Medications Ordered in Other Visits  Medication Dose Route Frequency Provider Last Rate Last Dose  . 0.9 %  sodium chloride infusion   Intravenous Once Victorino December, MD 1,000 mL/hr at 07/02/12 1225    . atropine injection 0.5 mg  0.5 mg Intravenous Once PRN Jenasis Straley  Cherene Altes, MD   0.5 mg at 07/02/12 1420  . dexamethasone (DECADRON) injection 20 mg  20 mg Intravenous Once Victorino December, MD   20 mg at 07/02/12 1240  . fluorouracil (ADRUCIL) 2,950 mg in sodium chloride 0.9 % 150 mL chemo infusion  2,400 mg/m2 (Treatment Plan Actual) Intravenous 1 day or 1 dose Victorino December, MD   2,950 mg at 07/02/12 1618  . fluorouracil (ADRUCIL) chemo injection 500 mg  400 mg/m2 (Treatment Plan Actual) Intravenous Once Victorino December, MD   500 mg at 07/02/12 1610  . heparin lock flush 100 unit/mL  500 Units Intravenous Once Victorino December, MD      . irinotecan (CAMPTOSAR) 220 mg in dextrose 5 % 500 mL chemo infusion  220 mg Intravenous Once Victorino December, MD 341 mL/hr at 07/02/12 1401 220 mg at 07/02/12 1401  . leucovorin 500 mg in dextrose 5 % 250 mL infusion  500 mg Intravenous Once Victorino December, MD 183 mL/hr at 07/02/12 1401 500 mg at 07/02/12 1401  . ondansetron (ZOFRAN) IVPB 16 mg  16 mg Intravenous Once Victorino December, MD   16 mg at 07/02/12 1240  . sodium chloride 0.9 % injection 10 mL  10 mL Intravenous PRN Victorino December, MD      . sodium chloride 0.9 % injection 10 mL  10 mL Intracatheter PRN Victorino December, MD        SURGICAL HISTORY:  Past Surgical History  Procedure  Date  . Tubal ligation   . Carpal tunnel release   . Colonoscopy 07/26/2011    Procedure: COLONOSCOPY;  Surgeon: Arlyce Harman, MD;  Location: [facility];  Service: Endoscopy;  Laterality: N/A;  10:40  . Esophagogastroduodenoscopy 07/26/2011    Procedure: ESOPHAGOGASTRODUODENOSCOPY (EGD);  Surgeon: Arlyce Harman, MD;  Location: [facility];  Service: Endoscopy;  Laterality: N/A;  . Esophagogastroduodenoscopy 12/27/2011    Procedure: ESOPHAGOGASTRODUODENOSCOPY (EGD);  Surgeon: Shirley Friar, MD;  Location: [facility];  Service: Endoscopy;  Laterality: N/A;  . Laparoscopic colon resection 12/14/11    diverting ileostomy  . Portacath placement 02/21/2012    Procedure: INSERTION PORT-A-CATH;  Surgeon: [name], MD;  Location: [facility] OR;  Service: General;  Laterality: N/A;    REVIEW OF SYSTEMS:  Pertinent items are noted in HPI.   PHYSICAL EXAMINATION:  Gen.: Patient is a thin female but in no acute distress HEENT: PERRLA sclerae anicteric no conjunctival pallor oral mucosa is moist no thrush Neck: supple, no palpable adenopathy Lungs: clear to auscultation, no wheezes, rhonchi, or rales Cardiovascular: regular rate rhythm, S1, S2, no murmurs, rubs or gallops Abdomen: Soft, non-tender, non-distended, normoactive bowel sounds, no HSM Stoma site is pink Extremities: warm and well perfused, no clubbing, cyanosis, or edema Skin: No rashes or lesions ECOG PERFORMANCE STATUS: 1 - Symptomatic but completely ambulatory   Blood pressure 99/66, pulse 84, temperature 98.5 F (36.9 C), temperature source Oral, resp. rate 20, height 4\' 9"  (1.448 m), weight 79 lb 1.6 oz (35.88 kg), last menstrual period 02/20/2012.  LABORATORY DATA: Lab Results  Component Value Date   WBC 14.0* 07/02/2012   HGB 11.3* 07/02/2012   HCT 33.9* 07/02/2012   MCV 91.1 07/02/2012   PLT 185 07/02/2012      Chemistry      Component Value Date/Time   NA 139 07/02/2012 1050   NA 139 05/06/2012 1316   K 4.3  07/02/2012 1050   K 3.3*  05/06/2012 1316   CL 102 07/02/2012 1050   CL 104 05/06/2012 1316   CO2 24 07/02/2012 1050   CO2 27 05/06/2012 1316   BUN 11.0 07/02/2012 1050   BUN 8 05/06/2012 1316   CREATININE 0.8 07/02/2012 1050   CREATININE 0.70 05/06/2012 1316   CREATININE 0.70 07/27/2011 1306      Component Value Date/Time   CALCIUM 9.2 07/02/2012 1050   CALCIUM 9.0 05/06/2012 1316   ALKPHOS 149 07/02/2012 1050   ALKPHOS 81 05/06/2012 1316   AST 17 07/02/2012 1050   AST 55* 05/06/2012 1316   ALT 18 07/02/2012 1050   ALT 44* 05/06/2012 1316   BILITOT 0.50 07/02/2012 1050   BILITOT 0.7 05/06/2012 1316     ADDITIONAL INFORMATION: 1. Following placement in clearing solution, three more lymph nodes were identified, all of which are benign. Thus, the total lymph node count is 11. (JK:mw 12-18-11) 2. Following an exhaustive search, additional tissue has been submitted in an attempt to recover lymph nodes. Two more lymph nodes were identified, both benign. This brings the total number of lymph nodes recovered to 13, all of which are benign. (JBK:eps 12/26/11) FINAL DIAGNOSIS Diagnosis 1. Colon, resection margin (donut), distal rectal - COLORECTAL MUCOSA WITH FIBROSIS AND EDEMA, CONSISTENT WITH TREATMENT. - THERE IS NO EVIDENCE OF MALIGNANCY. 2. Colon, total resection (incl lymph nodes) - COLON WITH SCATTERED TUBULAR ADENOMAS. - HIGH GRADE DYSPLASIA IS NOT IDENTIFIED. - BENIGN APPENDIX WITH FIBROUS OBLITERATION OF THE TIP. - THERE IS NO EVIDENCE OF CARCINOMA IN 8 OF 8 LYMPH NODES (0/8). - SEE ONCOLOGY TABLE BELOW. 3. Colon, resection margin (donut), new distal margin and rectum - BENIGN SQUAMOUS LINED MUCOSA. - THERE IS NO EVIDENCE OF MALIGNANCY. Microscopic Comment 2. COLON AND RECTUM Specimen: Colon, appendix, and terminal ileum. Procedure: Total colectomy. Tumor site: N/A Specimen integrity: Intact. Macroscopic intactness of mesorectum: Complete. Macroscopic tumor perforation: N/A Invasive  tumor: N/A 1 of 3 FINAL for DOT, SPLINTER ([id number]) Microscopic Comment(continued) Microscopic extension of invasive tumor: N/A Lymph-Vascular invasion: N/A Peri-neural invasion: N/A Tumor deposit(s) (discontinuous extramural extension): N/A Resection margins: Negative for adenocarcinoma. Treatment effect (neoadjuvant therapy): Present, significant. Number of lymph nodes examined- 8 ; Number positive - 0 (PLEASE NOTE: Additional tissue will be submitted in an attempt to recover more lymph nodes) Additional polyp(s): Multiple scattered tubular adenomas. Pathologic Staging: ypT0, ypN0 Ancillary studies: N/A Comments: Grossly, there is a 1.5 cm granular depressed area of mucosa present at the distal portion of the specimen. This entire area has been submitted for histologic evaluation revealing inflamed, fibrotic, and edematous colorectal mucosa, consistent with treatment effect. Adenocarcinoma is not identified. In addition, there are scattered tubular adenomas throughout the specimen. No high grade dysplasia is identified. The case was discussed with Dr. Donell Beers on 12/15/2011. (JBK:gt, 12/15/11) Pecola Leisure MD Pathologist, Electronic Signature (Case signed 12/15/2011) Intraoper   ASSESSMENT: 46 year old female with:  #1. stage III adenocarcinoma of the rectum. Patient is now status post definitive surgery after having received neoadjuvant chemotherapy and radiation therapy. Her final pathology does not reveal any evidence of residual disease. 8 nodes were negative for metastatic disease.  #2 . Patient was begun on adjuvant chemotherapy consisting of Xeloda and Oxaliplatin. She received one cycle. This course was complicated by development of a severe dehydration requiring hospitalization.  #3 Patient and I discussed rationale doing FOLFIRI. Risks and benefits of both 5-FU leucovorin and irinotecan were discussed with the patient. She understands literature was given to her.  A  total of 4 cycles will be planned.  She received her first cycle on    PLAN:  #1 patient will proceed with cycle #3 of fall fear he chemotherapy today.  #2 Pt is doing well today.  Reinforced foods to avoid for her diarrhea, and to continue her Lomotil.    #3 She has orders and appointments in for the next four days in case she needs them.  Will see her next week before her chemotherapy.    All questions were answered. The patient knows to call the clinic with any problems, questions or concerns. We can certainly see the patient much sooner if necessary.  I spent >25 minutes counseling the patient face to face. The total time spent in the appointment was 30 minutes.    Cherie Ouch Lyn Hollingshead, NP Medical Oncology [facility] Phone: [phone]   07/02/2012, 4:55 PM    ATTENDING'S ATTESTATION:  I personally reviewed patient's chart, examined patient myself, formulated the treatment plan as above.  Drue Second, MD Medical/Oncology [facility] [phone] (beeper) [phone] (Office)  07/02/2012, 4:55 PM

## 2012-07-02 NOTE — Patient Instructions (Addendum)
[facility] Discharge Instructions for Patients Receiving Chemotherapy  Today you received the following chemotherapy agents :  Camptosar,  Leucovorin,  5 FU.  To help prevent nausea and vomiting after your treatment, we encourage you to take your nausea medication as instructed by your physician, and take meds as needed for nausea.    If you develop nausea and vomiting that is not controlled by your nausea medication, call the clinic. If it is after clinic hours your family physician or the after hours number for the clinic or go to the Emergency Department.   BELOW ARE SYMPTOMS THAT SHOULD BE REPORTED IMMEDIATELY:  *FEVER GREATER THAN 100.5 F  *CHILLS WITH OR WITHOUT FEVER  NAUSEA AND VOMITING THAT IS NOT CONTROLLED WITH YOUR NAUSEA MEDICATION  *UNUSUAL SHORTNESS OF BREATH  *UNUSUAL BRUISING OR BLEEDING  TENDERNESS IN MOUTH AND THROAT WITH OR WITHOUT PRESENCE OF ULCERS  *URINARY PROBLEMS  *BOWEL PROBLEMS  UNUSUAL RASH Items with * indicate a potential emergency and should be followed up as soon as possible.  One of the nurses will contact you 24 hours after your treatment. Please let the nurse know about any problems that you may have experienced. Feel free to call the clinic you have any questions or concerns. The clinic phone number is [phone].   I have been informed and understand all the instructions given to me. I know to contact the clinic, my physician, or go to the Emergency Department if any problems should occur. I do not have any questions at this time, but understand that I may call the clinic during office hours   should I have any questions or need assistance in obtaining follow up care.    __________________________________________  _____________  __________ Signature of Patient or Authorized Representative            Date                   Time    __________________________________________ Nurse's Signature

## 2012-07-02 NOTE — Patient Instructions (Addendum)
Proceed with chemotherapy  IVF daily or as you think you need them

## 2012-07-02 NOTE — Telephone Encounter (Signed)
Per staff phone message and POF I have scheduled appts. I gave patient calendar. JMW

## 2012-07-03 ENCOUNTER — Ambulatory Visit (HOSPITAL_BASED_OUTPATIENT_CLINIC_OR_DEPARTMENT_OTHER): Payer: BC Managed Care – PPO

## 2012-07-03 ENCOUNTER — Ambulatory Visit: Payer: BC Managed Care – PPO

## 2012-07-03 VITALS — BP 111/69 | HR 81 | Temp 98.2°F | Resp 20

## 2012-07-03 DIAGNOSIS — C20 Malignant neoplasm of rectum: Secondary | ICD-10-CM

## 2012-07-03 DIAGNOSIS — E860 Dehydration: Secondary | ICD-10-CM

## 2012-07-03 MED ORDER — SODIUM CHLORIDE 0.9 % IV SOLN: Freq: Once | INTRAVENOUS | Status: DC

## 2012-07-03 MED ORDER — SODIUM CHLORIDE 0.9 % IV SOLN
INTRAVENOUS | Status: DC
  Administered 2012-07-03: 11:00:00 via INTRAVENOUS

## 2012-07-03 NOTE — Progress Notes (Signed)
Patient received IVFs today; tolerated well; not symptoms of nausea; pre and post vital signs stable; ambulated well at discharge.

## 2012-07-04 ENCOUNTER — Ambulatory Visit (HOSPITAL_BASED_OUTPATIENT_CLINIC_OR_DEPARTMENT_OTHER): Payer: BC Managed Care – PPO

## 2012-07-04 ENCOUNTER — Ambulatory Visit: Payer: BC Managed Care – PPO

## 2012-07-04 VITALS — BP 108/62 | HR 67 | Temp 97.8°F | Resp 17

## 2012-07-04 DIAGNOSIS — C20 Malignant neoplasm of rectum: Secondary | ICD-10-CM

## 2012-07-04 DIAGNOSIS — Z5189 Encounter for other specified aftercare: Secondary | ICD-10-CM

## 2012-07-04 MED ORDER — HEPARIN SOD (PORK) LOCK FLUSH 100 UNIT/ML IV SOLN
500.0000 [IU] | Freq: Once | INTRAVENOUS | Status: AC | PRN
  Administered 2012-07-04: 500 [IU]
  Filled 2012-07-04: qty 5

## 2012-07-04 MED ORDER — SODIUM CHLORIDE 0.9 % IV SOLN
Freq: Once | INTRAVENOUS | Status: AC
  Administered 2012-07-04: 12:00:00 via INTRAVENOUS

## 2012-07-04 MED ORDER — SODIUM CHLORIDE 0.9 % IV SOLN: Freq: Once | INTRAVENOUS | Status: DC

## 2012-07-04 MED ORDER — SODIUM CHLORIDE 0.9 % IJ SOLN
10.0000 mL | INTRAMUSCULAR | Status: DC | PRN
  Administered 2012-07-04: 10 mL
  Filled 2012-07-04: qty 10

## 2012-07-04 MED ORDER — PEGFILGRASTIM INJECTION 6 MG/0.6ML
6.0000 mg | Freq: Once | SUBCUTANEOUS | Status: AC
  Administered 2012-07-04: 6 mg via SUBCUTANEOUS
  Filled 2012-07-04: qty 0.6

## 2012-07-04 NOTE — Patient Instructions (Signed)
[facility] Discharge Instructions for Patients Receiving Chemotherapy  Today you received IV fluids.  To help prevent nausea and vomiting we encourage you to take your nausea medication. Begin taking your nausea medication as often as prescribed for by Dr. Welton Flakes.    If you develop nausea and vomiting that is not controlled by your nausea medication, call the clinic. If it is after clinic hours your family physician or the after hours number for the clinic or go to the Emergency Department.   BELOW ARE SYMPTOMS THAT SHOULD BE REPORTED IMMEDIATELY:  *FEVER GREATER THAN 100.5 F  *CHILLS WITH OR WITHOUT FEVER  NAUSEA AND VOMITING THAT IS NOT CONTROLLED WITH YOUR NAUSEA MEDICATION  *UNUSUAL SHORTNESS OF BREATH  *UNUSUAL BRUISING OR BLEEDING  TENDERNESS IN MOUTH AND THROAT WITH OR WITHOUT PRESENCE OF ULCERS  *URINARY PROBLEMS  *BOWEL PROBLEMS  UNUSUAL RASH Items with * indicate a potential emergency and should be followed up as soon as possible.  One of the nurses will contact you 24 hours after your treatment. Please let the nurse know about any problems that you may have experienced. Feel free to call the clinic you have any questions or concerns. The clinic phone number is [phone].   I have been informed and understand all the instructions given to me. I know to contact the clinic, my physician, or go to the Emergency Department if any problems should occur. I do not have any questions at this time, but understand that I may call the clinic during office hours   should I have any questions or need assistance in obtaining follow up care.    __________________________________________  _____________  __________ Signature of Patient or Authorized Representative            Date                   Time    __________________________________________ Nurse's Signature

## 2012-07-05 ENCOUNTER — Ambulatory Visit: Payer: BC Managed Care – PPO

## 2012-07-06 ENCOUNTER — Ambulatory Visit: Payer: BC Managed Care – PPO

## 2012-07-06 ENCOUNTER — Ambulatory Visit (HOSPITAL_BASED_OUTPATIENT_CLINIC_OR_DEPARTMENT_OTHER): Payer: BC Managed Care – PPO

## 2012-07-06 VITALS — BP 105/67 | HR 79 | Temp 97.2°F | Resp 20

## 2012-07-06 DIAGNOSIS — E860 Dehydration: Secondary | ICD-10-CM

## 2012-07-06 DIAGNOSIS — C20 Malignant neoplasm of rectum: Secondary | ICD-10-CM

## 2012-07-06 MED ORDER — SODIUM CHLORIDE 0.9 % IV SOLN
INTRAVENOUS | Status: DC
  Administered 2012-07-06: 10:00:00 via INTRAVENOUS

## 2012-07-06 MED ORDER — SODIUM CHLORIDE 0.9 % IV SOLN: INTRAVENOUS | Status: DC

## 2012-07-06 NOTE — Progress Notes (Signed)
Tolerated fluids well, pt asked to have infusion over 1 hr.  Ambulatory at dc with husband. Advised to call for questions or concerns.  dmr

## 2012-07-08 ENCOUNTER — Ambulatory Visit: Payer: BC Managed Care – PPO

## 2012-07-08 ENCOUNTER — Other Ambulatory Visit: Payer: Self-pay | Admitting: Oncology

## 2012-07-08 ENCOUNTER — Ambulatory Visit (HOSPITAL_BASED_OUTPATIENT_CLINIC_OR_DEPARTMENT_OTHER): Payer: BC Managed Care – PPO

## 2012-07-08 VITALS — BP 116/71 | HR 70 | Temp 97.9°F | Resp 20

## 2012-07-08 DIAGNOSIS — C20 Malignant neoplasm of rectum: Secondary | ICD-10-CM

## 2012-07-08 DIAGNOSIS — E860 Dehydration: Secondary | ICD-10-CM

## 2012-07-08 DIAGNOSIS — R197 Diarrhea, unspecified: Secondary | ICD-10-CM

## 2012-07-08 MED ORDER — SODIUM CHLORIDE 0.9 % IV SOLN
Freq: Once | INTRAVENOUS | Status: AC
  Administered 2012-07-08: 11:00:00 via INTRAVENOUS

## 2012-07-08 MED ORDER — SODIUM CHLORIDE 0.9 % IJ SOLN
10.0000 mL | INTRAMUSCULAR | Status: DC | PRN
  Administered 2012-07-08: 10 mL via INTRAVENOUS
  Filled 2012-07-08: qty 10

## 2012-07-08 MED ORDER — HEPARIN SOD (PORK) LOCK FLUSH 100 UNIT/ML IV SOLN
500.0000 [IU] | Freq: Once | INTRAVENOUS | Status: AC
  Administered 2012-07-08: 500 [IU] via INTRAVENOUS
  Filled 2012-07-08: qty 5

## 2012-07-08 NOTE — Patient Instructions (Signed)
[facility] Discharge Instructions for Patients Receiving IVF  Today you received the following chemotherapy agents Normal Saline  To help prevent nausea and vomiting after your treatment, we encourage you to take your nausea medication as directed.   If you develop nausea and vomiting that is not controlled by your nausea medication, call the clinic. If it is after clinic hours your family physician or the after hours number for the clinic or go to the Emergency Department.   BELOW ARE SYMPTOMS THAT SHOULD BE REPORTED IMMEDIATELY:  *FEVER GREATER THAN 100.5 F  *CHILLS WITH OR WITHOUT FEVER  NAUSEA AND VOMITING THAT IS NOT CONTROLLED WITH YOUR NAUSEA MEDICATION  *UNUSUAL SHORTNESS OF BREATH  *UNUSUAL BRUISING OR BLEEDING  TENDERNESS IN MOUTH AND THROAT WITH OR WITHOUT PRESENCE OF ULCERS  *URINARY PROBLEMS  *BOWEL PROBLEMS  UNUSUAL RASH Items with * indicate a potential emergency and should be followed up as soon as possible.  One of the nurses will contact you 24 hours after your treatment. Please let the nurse know about any problems that you may have experienced. Feel free to call the clinic you have any questions or concerns. The clinic phone number is [phone].   I have been informed and understand all the instructions given to me. I know to contact the clinic, my physician, or go to the Emergency Department if any problems should occur. I do not have any questions at this time, but understand that I may call the clinic during office hours   should I have any questions or need assistance in obtaining follow up care.    __________________________________________  _____________  __________ Signature of Patient or Authorized Representative            Date                   Time    __________________________________________ Nurse's Signature

## 2012-07-09 ENCOUNTER — Ambulatory Visit (HOSPITAL_BASED_OUTPATIENT_CLINIC_OR_DEPARTMENT_OTHER): Payer: BC Managed Care – PPO | Admitting: Lab

## 2012-07-09 ENCOUNTER — Telehealth: Payer: Self-pay | Admitting: Emergency Medicine

## 2012-07-09 ENCOUNTER — Telehealth: Payer: Self-pay | Admitting: *Deleted

## 2012-07-09 ENCOUNTER — Other Ambulatory Visit: Payer: BC Managed Care – PPO | Admitting: Lab

## 2012-07-09 ENCOUNTER — Encounter: Payer: Self-pay | Admitting: Adult Health

## 2012-07-09 ENCOUNTER — Ambulatory Visit (HOSPITAL_BASED_OUTPATIENT_CLINIC_OR_DEPARTMENT_OTHER): Payer: BC Managed Care – PPO | Admitting: Adult Health

## 2012-07-09 ENCOUNTER — Other Ambulatory Visit: Payer: Self-pay | Admitting: Emergency Medicine

## 2012-07-09 ENCOUNTER — Ambulatory Visit: Payer: BC Managed Care – PPO | Admitting: Adult Health

## 2012-07-09 ENCOUNTER — Ambulatory Visit: Payer: BC Managed Care – PPO

## 2012-07-09 VITALS — BP 92/65 | HR 85 | Temp 98.0°F | Resp 20 | Ht <= 58 in | Wt 79.9 lb

## 2012-07-09 DIAGNOSIS — C20 Malignant neoplasm of rectum: Secondary | ICD-10-CM

## 2012-07-09 DIAGNOSIS — C7952 Secondary malignant neoplasm of bone marrow: Secondary | ICD-10-CM

## 2012-07-09 DIAGNOSIS — R198 Other specified symptoms and signs involving the digestive system and abdomen: Secondary | ICD-10-CM

## 2012-07-09 DIAGNOSIS — R5383 Other fatigue: Secondary | ICD-10-CM

## 2012-07-09 DIAGNOSIS — R197 Diarrhea, unspecified: Secondary | ICD-10-CM

## 2012-07-09 DIAGNOSIS — E860 Dehydration: Secondary | ICD-10-CM

## 2012-07-09 DIAGNOSIS — Z932 Ileostomy status: Secondary | ICD-10-CM

## 2012-07-09 LAB — CBC WITH DIFFERENTIAL/PLATELET
BASO%: 0.3 % (ref 0.0–2.0)
Basophils Absolute: 0 10*3/uL (ref 0.0–0.1)
EOS%: 1.4 % (ref 0.0–7.0)
MCH: 31.4 pg (ref 25.1–34.0)
MCHC: 33.9 g/dL (ref 31.5–36.0)
MCV: 92.4 fL (ref 79.5–101.0)
MONO%: 12 % (ref 0.0–14.0)
NEUT%: 76.8 % (ref 38.4–76.8)
RDW: 14.8 % — ABNORMAL HIGH (ref 11.2–14.5)
lymph#: 1 10*3/uL (ref 0.9–3.3)

## 2012-07-09 LAB — COMPREHENSIVE METABOLIC PANEL (CC13)
ALT: 15 U/L (ref 0–55)
AST: 14 U/L (ref 5–34)
Alkaline Phosphatase: 149 U/L (ref 40–150)
BUN: 7 mg/dL (ref 7.0–26.0)
Calcium: 9.2 mg/dL (ref 8.4–10.4)
Chloride: 105 mEq/L (ref 98–107)
Creatinine: 0.7 mg/dL (ref 0.6–1.1)

## 2012-07-09 NOTE — Progress Notes (Signed)
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  PRIOR THERAPY:   #1 patient presented with a several month history of diarrhea and then subsequent bleeding. She was found to be anemic with a hemoglobin of 10. Because of this she went on to have a colonoscopy performed. The colonoscopy showed multiple sessile polyps in the cecum ascending transverse and sigmoid colon as well as the rectum. The rectal area showed a large exophytic rectal mass approximately 1 cm above the dentate line measuring 3-4 cm. She had a biopsy of this performed and was found to be an adenocarcinoma consistent with a rectal primary.  #2 patient was seen by Dr. [name] at [facility] who performed a rectal ultrasound. The staging clinically was T3 N1. It was recommended by Dr. [name] the patient undergo neoadjuvant chemotherapy and radiation higher to her definitive surgery.  #3 Patient has completed concurrent radiation and chemotherapy. Her chemotherapy consisted of single agent xeloda administrated 08/22/11 - 10/09/2011. Overall she tolerated her treatment very well  #4 Has completed all of neoadjuvant treatment And the PET scan shows no evidence of distant metastasis and a para rectal peritoneal nodes less impressive she has had significant response to therapy.  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 APC is negative  #6 patient had a laparotomy with proctocolectomy, J-pouch with ileoanal anastomosis, loop ileostomy on 12/14/2011. The final pathology did not reveal any evidence of residual disease. Patient had a  complicated postop course including high output ileostomy. She required intensive IV fluids on an outpatient basis to prevent dehydration.  #7 patient began adjuvant chemotherapy consisting of XELOX starting 03/05/2012 on a 21 day cycle. A Total of 6 cycles were planned unfortunate patient was not able to tolerate this therapy due to side effects from the xeloda With development of hand-foot syndrome.She was not able to tolerate oxaliplatin due to significant toxicity from cold sensitivity. In that this treatment is being discontinued.  CURRENT THERAPY: Cycle 3 day 8 of FOLFIRI adjuvant chemotherapy  INTERVAL HISTORY: Renee Nicholson 46 y.o. female returns for followup visit today. She is continuing to have diarrhea and empties her back anywhere from 5-7 times in a 24 hour period.  She has been taking Lomotil and it does help occasionally. She endorses fatigue, but is otherwise feeling well.  She has a tiny ulcer on her tongue that she has been eating ice cream for.  She does not want to receive IV fluids today.  She will come back tomorrow to get them.  She denies dizziness, lightheadedness, chest pain, shortness of breath or any other concerns.   MEDICAL HISTORY: Past Medical History  Diagnosis Date  . Anemia   . Diarrhea   . Rectal cancer 08/16/2011  . Colon cancer   . Anxiety   . Bright red rectal bleeding 09/13/2011  . History of chemotherapy     completed 09/2011   . Hx of radiation therapy 08/29/11 to 10/09/11    rectum    ALLERGIES:   has no known allergies.  MEDICATIONS:  Current Outpatient Prescriptions  Medication Sig Dispense Refill  . acetaminophen (TYLENOL)  325 MG tablet Take 2 tablets (650 mg total) by mouth every 4 (four) hours as needed.  200 tablet  1  . dexamethasone (DECADRON) 4 MG tablet Take 2 tablets by mouth two times a day starting the day after chemotherapy for 3 days. Take with food.  30 tablet  1  . diphenoxylate-atropine (LOMOTIL) 2.5-0.025 MG per tablet Take 1  tablet by mouth 4 (four) times daily as needed for diarrhea or loose stools.  60 tablet  5  . feeding supplement (ENSURE COMPLETE) LIQD Take 237 mLs by mouth 3 (three) times daily between meals.  50 Bottle  6  . feeding supplement (RESOURCE BREEZE) LIQD Take 1 Container by mouth 3 (three) times daily between meals.  30 Container  6  . Ferrous Sulfate (IRON) 28 MG TABS Take 1 tablet by mouth daily.      . Heparin Lock Flush (HEPARIN FLUSH, PORCINE,) 100 UNIT/ML injection 1 mL (100 Units total) by Intracatheter route as needed (250 units per lumen).  5 Syringe  3  . HYDROcodone-acetaminophen (VICODIN) 5-500 MG per tablet Take 1-2 tablets by mouth every 6 (six) hours as needed for pain.  30 tablet  0  . lidocaine-prilocaine (EMLA) cream Apply topically as needed.  30 g  6  . loperamide (IMODIUM A-D) 2 MG tablet Take 2 tab at onset of diarrhea, then 1 tab q2h until 12 hr have passed without a BM.  May take 2 tab q4h qhs. If diarrhea recurs repeat.  60 tablet  4  . loperamide (IMODIUM) 2 MG capsule Take 2 capsules by mouth Every 4 hours as needed.      Marland Kitchen LORazepam (ATIVAN) 0.5 MG tablet Take 1 tablet (0.5 mg total) by mouth every 6 (six) hours as needed for anxiety.  30 tablet  0  . megestrol (MEGACE) 400 MG/10ML suspension Take 10 mLs (400 mg total) by mouth 2 (two) times daily.  240 mL  1  . Multiple Vitamin (MULTIVITAMIN) tablet Take 1 tablet by mouth every morning.       . ondansetron (ZOFRAN) 8 MG tablet Take 1 tablet two times a day starting the day after chemo for 3 days. Then take 1 tablet two times a day as needed for nausea or vomiting.  30 tablet  1  . pantoprazole (PROTONIX) 40 MG tablet Take 40 mg by mouth daily.      . polysaccharide iron (NIFEREX) 150 MG CAPS capsule Take 150 mg by mouth every morning.       . potassium chloride SA (K-DUR,KLOR-CON) 20 MEQ tablet Take 1 tablet (20 mEq total) by mouth daily.  30 tablet  6  . prochlorperazine (COMPAZINE) 10 MG tablet Take 1 tablet (10 mg  total) by mouth every 6 (six) hours as needed (Nausea or vomiting).  30 tablet  1  . prochlorperazine (COMPAZINE) 25 MG suppository Place 1 suppository (25 mg total) rectally every 12 (twelve) hours as needed for nausea.  12 suppository  3  . simethicone (MYLICON) 80 MG chewable tablet Chew 80 mg by mouth every 6 (six) hours as needed. For gas       No current facility-administered medications for this visit.   Facility-Administered Medications Ordered in Other Visits  Medication Dose Route Frequency Provider Last Rate Last Dose  . heparin lock flush 100 unit/mL  500 Units Intravenous Once Victorino December, MD      . sodium chloride 0.9 % injection 10 mL  10 mL Intravenous PRN Victorino December,  MD      . DISCONTD: sodium chloride 0.9 % injection 10 mL  10 mL Intravenous PRN Victorino December, MD   10 mL at 07/08/12 1219    SURGICAL HISTORY:  Past Surgical History  Procedure Date  . Tubal ligation   . Carpal tunnel release   . Colonoscopy 07/26/2011    Procedure: COLONOSCOPY;  Surgeon: Arlyce Harman, MD;  Location: [facility];  Service: Endoscopy;  Laterality: N/A;  10:40  . Esophagogastroduodenoscopy 07/26/2011    Procedure: ESOPHAGOGASTRODUODENOSCOPY (EGD);  Surgeon: Arlyce Harman, MD;  Location: [facility];  Service: Endoscopy;  Laterality: N/A;  . Esophagogastroduodenoscopy 12/27/2011    Procedure: ESOPHAGOGASTRODUODENOSCOPY (EGD);  Surgeon: Shirley Friar, MD;  Location: [facility];  Service: Endoscopy;  Laterality: N/A;  . Laparoscopic colon resection 12/14/11    diverting ileostomy  . Portacath placement 02/21/2012    Procedure: INSERTION PORT-A-CATH;  Surgeon: [name], MD;  Location: [facility] OR;  Service: General;  Laterality: N/A;    REVIEW OF SYSTEMS:   General: fatigue (+), night sweats (-), fever (-), pain (-) Lymph: palpable nodes (-) HEENT: vision changes (-), mucositis (-), gum bleeding (-), epistaxis (-) Cardiovascular: chest pain (-), palpitations (-) Pulmonary:  shortness of breath (-), dyspnea on exertion (-), cough (-), hemoptysis (-) GI:  Early satiety (-), melena (-), dysphagia (-), nausea/vomiting (-), diarrhea (+) GU: dysuria (-), hematuria (-), incontinence (-) Musculoskeletal: joint swelling (-), joint pain (-), back pain (-) Neuro: weakness (-), numbness (-), headache (-), confusion (-) Skin: Rash (-), lesions (-), dryness (-) Psych: depression (-), suicidal/homicidal ideation (-), feeling of hopelessness (-)   PHYSICAL EXAMINATION:  BP 92/65  Pulse 85  Temp 98 F (36.7 C) (Oral)  Resp 20  Ht 4\' 9"  (1.448 m)  Wt 79 lb 14.4 oz (36.242 kg)  BMI 17.29 kg/m2  LMP 02/20/2012 Gen.: Patient is a thin female but in no acute distress HEENT: PERRLA sclerae anicteric no conjunctival pallor oral mucosa is moist no thrush Neck: supple, no palpable adenopathy Lungs: clear to auscultation, no wheezes, rhonchi, or rales Cardiovascular: regular rate rhythm, S1, S2, no murmurs, rubs or gallops Abdomen: Soft, non-tender, non-distended, normoactive bowel sounds, no HSM Stoma site is pink Extremities: warm and well perfused, no clubbing, cyanosis, or edema Skin: No rashes or lesions ECOG PERFORMANCE STATUS: 1 - Symptomatic but completely ambulatory    LABORATORY DATA: Lab Results  Component Value Date   WBC 10.6* 07/09/2012   HGB 9.8* 07/09/2012   HCT 29.0* 07/09/2012   MCV 92.4 07/09/2012   PLT 65* 07/09/2012      Chemistry      Component Value Date/Time   NA 139 07/02/2012 1050   NA 139 05/06/2012 1316   K 4.3 07/02/2012 1050   K 3.3* 05/06/2012 1316   CL 102 07/02/2012 1050   CL 104 05/06/2012 1316   CO2 24 07/02/2012 1050   CO2 27 05/06/2012 1316   BUN 11.0 07/02/2012 1050   BUN 8 05/06/2012 1316   CREATININE 0.8 07/02/2012 1050   CREATININE 0.70 05/06/2012 1316   CREATININE 0.70 07/27/2011 1306      Component Value Date/Time   CALCIUM 9.2 07/02/2012 1050   CALCIUM 9.0 05/06/2012 1316   ALKPHOS 149 07/02/2012 1050   ALKPHOS 81  05/06/2012 1316   AST 17 07/02/2012 1050   AST 55* 05/06/2012 1316   ALT 18 07/02/2012 1050   ALT 44* 05/06/2012 1316   BILITOT 0.50 07/02/2012 1050  BILITOT 0.7 05/06/2012 1316     ADDITIONAL INFORMATION: 1. Following placement in clearing solution, three more lymph nodes were identified, all of which are benign. Thus, the total lymph node count is 11. (JK:mw 12-18-11) 2. Following an exhaustive search, additional tissue has been submitted in an attempt to recover lymph nodes. Two more lymph nodes were identified, both benign. This brings the total number of lymph nodes recovered to 13, all of which are benign. (JBK:eps 12/26/11) FINAL DIAGNOSIS Diagnosis 1. Colon, resection margin (donut), distal rectal - COLORECTAL MUCOSA WITH FIBROSIS AND EDEMA, CONSISTENT WITH TREATMENT. - THERE IS NO EVIDENCE OF MALIGNANCY. 2. Colon, total resection (incl lymph nodes) - COLON WITH SCATTERED TUBULAR ADENOMAS. - HIGH GRADE DYSPLASIA IS NOT IDENTIFIED. - BENIGN APPENDIX WITH FIBROUS OBLITERATION OF THE TIP. - THERE IS NO EVIDENCE OF CARCINOMA IN 8 OF 8 LYMPH NODES (0/8). - SEE ONCOLOGY TABLE BELOW. 3. Colon, resection margin (donut), new distal margin and rectum - BENIGN SQUAMOUS LINED MUCOSA. - THERE IS NO EVIDENCE OF MALIGNANCY. Microscopic Comment 2. COLON AND RECTUM Specimen: Colon, appendix, and terminal ileum. Procedure: Total colectomy. Tumor site: N/A Specimen integrity: Intact. Macroscopic intactness of mesorectum: Complete. Macroscopic tumor perforation: N/A Invasive tumor: N/A 1 of 3 FINAL for CORNEISHA, ALVI ([id number]) Microscopic Comment(continued) Microscopic extension of invasive tumor: N/A Lymph-Vascular invasion: N/A Peri-neural invasion: N/A Tumor deposit(s) (discontinuous extramural extension): N/A Resection margins: Negative for adenocarcinoma. Treatment effect (neoadjuvant therapy): Present, significant. Number of lymph nodes examined- 8 ; Number positive - 0 (PLEASE  NOTE: Additional tissue will be submitted in an attempt to recover more lymph nodes) Additional polyp(s): Multiple scattered tubular adenomas. Pathologic Staging: ypT0, ypN0 Ancillary studies: N/A Comments: Grossly, there is a 1.5 cm granular depressed area of mucosa present at the distal portion of the specimen. This entire area has been submitted for histologic evaluation revealing inflamed, fibrotic, and edematous colorectal mucosa, consistent with treatment effect. Adenocarcinoma is not identified. In addition, there are scattered tubular adenomas throughout the specimen. No high grade dysplasia is identified. The case was discussed with Dr. Donell Beers on 12/15/2011. (JBK:gt, 12/15/11) Pecola Leisure MD Pathologist, Electronic Signature (Case signed 12/15/2011) Intraoper   ASSESSMENT: 46 year old female with:  #1. stage III adenocarcinoma of the rectum. Patient is now status post definitive surgery after having received neoadjuvant chemotherapy and radiation therapy. Her final pathology does not reveal any evidence of residual disease. 8 nodes were negative for metastatic disease.  #2 . Patient was begun on adjuvant chemotherapy consisting of Xeloda and Oxaliplatin. She received one cycle. This course was complicated by development of a severe dehydration requiring hospitalization.  #3 Patient and I discussed rationale doing FOLFIRI. Risks and benefits of both 5-FU leucovorin and irinotecan were discussed with the patient. She understands literature was given to her.  A total of 4 cycles will be planned.  She received her first cycle on    PLAN:  #1 Doing well after FOLFIRI.  Her counts are stable.    #2 Reinforced foods to avoid for her diarrhea, and to continue her Lomotil.    #3 She has orders and appointments in for the next four days in case she needs IV fluids.  She has an appt on 10/22 to see Dr. Welton Flakes and receive cycle 4 of FOLFIRI.    All questions were answered. The patient knows  to call the clinic with any problems, questions or concerns. We can certainly see the patient much sooner if necessary.  I spent >25 minutes counseling  the patient face to face. The total time spent in the appointment was 30 minutes.    Cherie Ouch Lyn Hollingshead, NP Medical Oncology [facility] Phone: [phone]   07/09/2012, 1:03 PM

## 2012-07-09 NOTE — Addendum Note (Signed)
Addended by: Augustin Schooling C on: 07/09/2012 01:10 PM   Modules accepted: Level of Service

## 2012-07-09 NOTE — Telephone Encounter (Signed)
Advised patient to restart her PO potassium due to labs of K 3.2 today.   Patient verbalized understanding.

## 2012-07-09 NOTE — Patient Instructions (Signed)
Doing well.  Will schedule you for IV fluids Wednesday-Saturday.  We will see you back on 10/22 for an appointment with Dr. Welton Flakes and your next cycle of chemotherapy.  Please call if you have any questions or concerns.

## 2012-07-09 NOTE — Telephone Encounter (Signed)
Per POF I have scheduled aptpts. JMW

## 2012-07-10 ENCOUNTER — Ambulatory Visit: Payer: BC Managed Care – PPO

## 2012-07-10 ENCOUNTER — Telehealth: Payer: Self-pay | Admitting: *Deleted

## 2012-07-10 NOTE — Telephone Encounter (Signed)
Patient called and canceled appt for today. JMW

## 2012-07-11 ENCOUNTER — Other Ambulatory Visit: Payer: Self-pay | Admitting: Adult Health

## 2012-07-11 ENCOUNTER — Ambulatory Visit (HOSPITAL_BASED_OUTPATIENT_CLINIC_OR_DEPARTMENT_OTHER): Payer: BC Managed Care – PPO

## 2012-07-11 DIAGNOSIS — C20 Malignant neoplasm of rectum: Secondary | ICD-10-CM

## 2012-07-11 DIAGNOSIS — E860 Dehydration: Secondary | ICD-10-CM

## 2012-07-11 DIAGNOSIS — R197 Diarrhea, unspecified: Secondary | ICD-10-CM

## 2012-07-11 DIAGNOSIS — Z932 Ileostomy status: Secondary | ICD-10-CM

## 2012-07-11 MED ORDER — SODIUM CHLORIDE 0.9 % IV SOLN
1000.0000 mL | Freq: Once | INTRAVENOUS | Status: AC
  Administered 2012-07-11: 1000 mL via INTRAVENOUS

## 2012-07-11 MED ORDER — SODIUM CHLORIDE 0.9 % IV SOLN: Freq: Once | INTRAVENOUS | Status: DC

## 2012-07-11 NOTE — Patient Instructions (Signed)
[facility] Discharge Instructions for Patients Receiving Chemotherapy  Today you received IV fluids of Normal Saline  To help prevent nausea and vomiting after your treatment, we encourage you to take your nausea medication. Take your nausea medication as often as prescribed for by Dr. Welton Flakes.    If you develop nausea and vomiting that is not controlled by your nausea medication, call the clinic. If it is after clinic hours your family physician or the after hours number for the clinic or go to the Emergency Department.   BELOW ARE SYMPTOMS THAT SHOULD BE REPORTED IMMEDIATELY:  *FEVER GREATER THAN 100.5 F  *CHILLS WITH OR WITHOUT FEVER  NAUSEA AND VOMITING THAT IS NOT CONTROLLED WITH YOUR NAUSEA MEDICATION  *UNUSUAL SHORTNESS OF BREATH  *UNUSUAL BRUISING OR BLEEDING  TENDERNESS IN MOUTH AND THROAT WITH OR WITHOUT PRESENCE OF ULCERS  *URINARY PROBLEMS  *BOWEL PROBLEMS  UNUSUAL RASH Items with * indicate a potential emergency and should be followed up as soon as possible.  One of the nurses will contact you 24 hours after your treatment. Please let the nurse know about any problems that you may have experienced. Feel free to call the clinic you have any questions or concerns. The clinic phone number is [phone].   I have been informed and understand all the instructions given to me. I know to contact the clinic, my physician, or go to the Emergency Department if any problems should occur. I do not have any questions at this time, but understand that I may call the clinic during office hours   should I have any questions or need assistance in obtaining follow up care.    __________________________________________  _____________  __________ Signature of Patient or Authorized Representative            Date                   Time    __________________________________________ Nurse's Signature      Dehydration, Adult Dehydration is when you lose more  fluids from the body than you take in. Vital organs like the kidneys, brain, and heart cannot function without a proper amount of fluids and salt. Any loss of fluids from the body can cause dehydration.  CAUSES   Vomiting.  Diarrhea.  Excessive sweating.  Excessive urine output.  Fever. SYMPTOMS  Mild dehydration  Thirst.  Dry lips.  Slightly dry mouth. Moderate dehydration  Very dry mouth.  Sunken eyes.  Skin does not bounce back quickly when lightly pinched and released.  Dark urine and decreased urine production.  Decreased tear production.  Headache. Severe dehydration  Very dry mouth.  Extreme thirst.  Rapid, weak pulse (more than 100 beats per minute at rest).  Cold hands and feet.  Not able to sweat in spite of heat and temperature.  Rapid breathing.  Blue lips.  Confusion and lethargy.  Difficulty being awakened.  Minimal urine production.  No tears. DIAGNOSIS  Your caregiver will diagnose dehydration based on your symptoms and your exam. Blood and urine tests will help confirm the diagnosis. The diagnostic evaluation should also identify the cause of dehydration. TREATMENT  Treatment of mild or moderate dehydration can often be done at home by increasing the amount of fluids that you drink. It is best to drink small amounts of fluid more often. Drinking too much at one time can make vomiting worse. Refer to the home care instructions below. Severe dehydration needs to be treated at the  hospital where you will probably be given intravenous (IV) fluids that contain water and electrolytes. HOME CARE INSTRUCTIONS   Ask your caregiver about specific rehydration instructions.  Drink enough fluids to keep your urine clear or pale yellow.  Drink small amounts frequently if you have nausea and vomiting.  Eat as you normally do.  Avoid:  Foods or drinks high in sugar.  Carbonated drinks.  Juice.  Extremely hot or cold fluids.  Drinks  with caffeine.  Fatty, greasy foods.  Alcohol.  Tobacco.  Overeating.  Gelatin desserts.  Wash your hands well to avoid spreading bacteria and viruses.  Only take over-the-counter or prescription medicines for pain, discomfort, or fever as directed by your caregiver.  Ask your caregiver if you should continue all prescribed and over-the-counter medicines.  Keep all follow-up appointments with your caregiver. SEEK MEDICAL CARE IF:  You have abdominal pain and it increases or stays in one area (localizes).  You have a rash, stiff neck, or severe headache.  You are irritable, sleepy, or difficult to awaken.  You are weak, dizzy, or extremely thirsty. SEEK IMMEDIATE MEDICAL CARE IF:   You are unable to keep fluids down or you get worse despite treatment.  You have frequent episodes of vomiting or diarrhea.  You have blood or green matter (bile) in your vomit.  You have blood in your stool or your stool looks black and tarry.  You have not urinated in 6 to 8 hours, or you have only urinated a small amount of very dark urine.  You have a fever.  You faint. MAKE SURE YOU:   Understand these instructions.  Will watch your condition.  Will get help right away if you are not doing well or get worse. Document Released: 09/11/2005 Document Revised: 12/04/2011 Document Reviewed: 05/01/2011 [facility] Patient Information 2013 [employer] [place], [place].

## 2012-07-12 ENCOUNTER — Ambulatory Visit: Payer: BC Managed Care – PPO

## 2012-07-13 ENCOUNTER — Ambulatory Visit (HOSPITAL_BASED_OUTPATIENT_CLINIC_OR_DEPARTMENT_OTHER): Payer: BC Managed Care – PPO

## 2012-07-13 VITALS — BP 94/60 | HR 69 | Temp 97.9°F

## 2012-07-13 DIAGNOSIS — C20 Malignant neoplasm of rectum: Secondary | ICD-10-CM

## 2012-07-13 DIAGNOSIS — E860 Dehydration: Secondary | ICD-10-CM

## 2012-07-13 DIAGNOSIS — R198 Other specified symptoms and signs involving the digestive system and abdomen: Secondary | ICD-10-CM

## 2012-07-13 MED ORDER — SODIUM CHLORIDE 0.9 % IV SOLN
INTRAVENOUS | Status: DC
  Administered 2012-07-13: 10:00:00 via INTRAVENOUS

## 2012-07-13 MED ORDER — SODIUM CHLORIDE 0.9 % IJ SOLN
10.0000 mL | INTRAMUSCULAR | Status: DC | PRN
  Administered 2012-07-13: 10 mL via INTRAVENOUS
  Filled 2012-07-13: qty 10

## 2012-07-13 MED ORDER — HEPARIN SOD (PORK) LOCK FLUSH 100 UNIT/ML IV SOLN
500.0000 [IU] | Freq: Once | INTRAVENOUS | Status: AC
  Administered 2012-07-13: 500 [IU] via INTRAVENOUS
  Filled 2012-07-13: qty 5

## 2012-07-16 ENCOUNTER — Other Ambulatory Visit (HOSPITAL_BASED_OUTPATIENT_CLINIC_OR_DEPARTMENT_OTHER): Payer: BC Managed Care – PPO | Admitting: Lab

## 2012-07-16 ENCOUNTER — Telehealth: Payer: Self-pay | Admitting: *Deleted

## 2012-07-16 ENCOUNTER — Encounter: Payer: Self-pay | Admitting: Oncology

## 2012-07-16 ENCOUNTER — Ambulatory Visit (HOSPITAL_BASED_OUTPATIENT_CLINIC_OR_DEPARTMENT_OTHER): Payer: BC Managed Care – PPO | Admitting: Oncology

## 2012-07-16 ENCOUNTER — Ambulatory Visit (HOSPITAL_BASED_OUTPATIENT_CLINIC_OR_DEPARTMENT_OTHER): Payer: BC Managed Care – PPO

## 2012-07-16 VITALS — BP 109/69 | HR 71 | Temp 98.4°F | Resp 20 | Ht <= 58 in | Wt 79.4 lb

## 2012-07-16 DIAGNOSIS — C20 Malignant neoplasm of rectum: Secondary | ICD-10-CM

## 2012-07-16 DIAGNOSIS — E860 Dehydration: Secondary | ICD-10-CM

## 2012-07-16 DIAGNOSIS — Z5111 Encounter for antineoplastic chemotherapy: Secondary | ICD-10-CM

## 2012-07-16 DIAGNOSIS — R197 Diarrhea, unspecified: Secondary | ICD-10-CM

## 2012-07-16 DIAGNOSIS — R198 Other specified symptoms and signs involving the digestive system and abdomen: Secondary | ICD-10-CM

## 2012-07-16 LAB — CBC WITH DIFFERENTIAL/PLATELET
Basophils Absolute: 0.2 10*3/uL — ABNORMAL HIGH (ref 0.0–0.1)
EOS%: 1.6 % (ref 0.0–7.0)
HCT: 31.4 % — ABNORMAL LOW (ref 34.8–46.6)
HGB: 10.3 g/dL — ABNORMAL LOW (ref 11.6–15.9)
MCH: 30.3 pg (ref 25.1–34.0)
MCV: 92.4 fL (ref 79.5–101.0)
MONO%: 7.8 % (ref 0.0–14.0)
NEUT%: 82.7 % — ABNORMAL HIGH (ref 38.4–76.8)
Platelets: 150 10*3/uL (ref 145–400)
lymph#: 1.6 10*3/uL (ref 0.9–3.3)

## 2012-07-16 LAB — COMPREHENSIVE METABOLIC PANEL (CC13)
Albumin: 3.9 g/dL (ref 3.5–5.0)
Alkaline Phosphatase: 168 U/L — ABNORMAL HIGH (ref 40–150)
BUN: 8 mg/dL (ref 7.0–26.0)
Glucose: 91 mg/dl (ref 70–99)
Potassium: 3.9 mEq/L (ref 3.5–5.1)

## 2012-07-16 MED ORDER — SODIUM CHLORIDE 0.9 % IV SOLN
Freq: Once | INTRAVENOUS | Status: AC
  Administered 2012-07-16: 10:00:00 via INTRAVENOUS

## 2012-07-16 MED ORDER — IRINOTECAN HCL CHEMO INJECTION 100 MG/5ML
220.0000 mg | Freq: Once | INTRAVENOUS | Status: AC
  Administered 2012-07-16: 220 mg via INTRAVENOUS
  Filled 2012-07-16: qty 11

## 2012-07-16 MED ORDER — ONDANSETRON 16 MG/50ML IVPB (CHCC)
16.0000 mg | Freq: Once | INTRAVENOUS | Status: AC
  Administered 2012-07-16: 16 mg via INTRAVENOUS

## 2012-07-16 MED ORDER — FLUOROURACIL CHEMO INJECTION 500 MG/10ML
400.0000 mg/m2 | Freq: Once | INTRAVENOUS | Status: AC
  Administered 2012-07-16: 500 mg via INTRAVENOUS
  Filled 2012-07-16: qty 10

## 2012-07-16 MED ORDER — DEXAMETHASONE SODIUM PHOSPHATE 4 MG/ML IJ SOLN
20.0000 mg | Freq: Once | INTRAMUSCULAR | Status: AC
  Administered 2012-07-16: 20 mg via INTRAVENOUS

## 2012-07-16 MED ORDER — SODIUM CHLORIDE 0.9 % IV SOLN
2400.0000 mg/m2 | INTRAVENOUS | Status: DC
  Administered 2012-07-16: 2950 mg via INTRAVENOUS
  Filled 2012-07-16: qty 59

## 2012-07-16 MED ORDER — LEUCOVORIN CALCIUM INJECTION 350 MG
500.0000 mg | Freq: Once | INTRAVENOUS | Status: AC
  Administered 2012-07-16: 500 mg via INTRAVENOUS
  Filled 2012-07-16: qty 25

## 2012-07-16 NOTE — Patient Instructions (Addendum)
Proceed with chemotherapy today  We will set you up for IVF this week and next week

## 2012-07-16 NOTE — Progress Notes (Signed)
OFFICE PROGRESS NOTE  CC: [name], MD [name], MD [name], MD [name], MD 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  PRIOR THERAPY:   #1 patient presented with a several month history of diarrhea and then subsequent bleeding. She was found to be anemic with a hemoglobin of 10. Because of this she went on to have a colonoscopy performed. The colonoscopy showed multiple sessile polyps in the cecum ascending transverse and sigmoid colon as well as the rectum. The rectal area showed a large exophytic rectal mass approximately 1 cm above the dentate line measuring 3-4 cm. She had a biopsy of this performed and was found to be an adenocarcinoma consistent with a rectal primary.  #2 patient was seen by Dr. [name] at [facility] who performed a rectal ultrasound. The staging clinically was T3 N1. It was recommended by Dr. [name] the patient undergo neoadjuvant chemotherapy and radiation higher to her definitive surgery.  #3 Patient has completed concurrent radiation and chemotherapy. Her chemotherapy consisted of single agent xeloda administrated 08/22/11 - 10/09/2011. Overall she tolerated her treatment very well  #4 Has completed all of neoadjuvant treatment And the PET scan shows no evidence of distant metastasis and a para rectal peritoneal nodes less impressive she has had significant response to therapy.  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 APC is negative  #6 patient had a laparotomy with proctocolectomy, J-pouch with ileoanal anastomosis, loop ileostomy on 12/14/2011. The final pathology did not reveal any evidence of residual disease. Patient had a  complicated postop course including high output ileostomy. She required intensive IV fluids on an outpatient basis to prevent dehydration.  #7 patient began adjuvant chemotherapy consisting of XELOX starting 03/05/2012 on a 21 day cycle. A Total of 6 cycles were planned unfortunate patient was not able to tolerate this therapy due to side effects from the xeloda With development of hand-foot syndrome.She was not able to tolerate oxaliplatin due to significant toxicity from cold sensitivity. In that this treatment is being discontinued.  #8 patient subsequently began adjuvant FOLFIRI beginning on 06/04/2012. She is planned for 4 cycles. She will complete all her chemotherapy on 07/16/12  CURRENT THERAPY: Cycle 4 day 1 of FOLFIRI adjuvant chemotherapy  INTERVAL HISTORY: Jayana Vanstone 46 y.o. female returns for followup visit today. She is due for her final cycle of chemotherapy. When she completes this she will be seen by Dr. [name] who is planning on doing ostomy takedown. Clinically patient is doing well she does still continue to have diarrhea off and on. She tells me that nothing tastes good but she is trying to keep as much as she possibly can she has lost a little bit more weight. She is denying any nausea vomiting fevers chills or night sweats she has no bleeding in her ostomy bag. She has no back pain no peripheral paresthesias. Remainder of the 10 point review of systems is negative.  MEDICAL HISTORY: Past Medical History  Diagnosis Date  . Anemia   . Diarrhea   . Rectal cancer 08/16/2011  . Colon cancer   . Anxiety   . Bright red rectal bleeding 09/13/2011  . History of chemotherapy  completed 09/2011   . Hx of radiation therapy 08/29/11 to 10/09/11    rectum    ALLERGIES:   has no known allergies.  MEDICATIONS:  Current Outpatient Prescriptions  Medication Sig Dispense Refill  . acetaminophen (TYLENOL) 325 MG tablet Take 2 tablets (650 mg total) by mouth every 4 (four)  hours as needed.  200 tablet  1  . dexamethasone (DECADRON) 4 MG tablet Take 2 tablets by mouth two times a day starting the day after chemotherapy for 3 days. Take with food.  30 tablet  1  . diphenoxylate-atropine (LOMOTIL) 2.5-0.025 MG per tablet Take 1 tablet by mouth 4 (four) times daily as needed for diarrhea or loose stools.  60 tablet  5  . feeding supplement (ENSURE COMPLETE) LIQD Take 237 mLs by mouth 3 (three) times daily between meals.  50 Bottle  6  . feeding supplement (RESOURCE BREEZE) LIQD Take 1 Container by mouth 3 (three) times daily between meals.  30 Container  6  . Ferrous Sulfate (IRON) 28 MG TABS Take 1 tablet by mouth daily.      . Heparin Lock Flush (HEPARIN FLUSH, PORCINE,) 100 UNIT/ML injection 1 mL (100 Units total) by Intracatheter route as needed (250 units per lumen).  5 Syringe  3  . HYDROcodone-acetaminophen (VICODIN) 5-500 MG per tablet Take 1-2 tablets by mouth every 6 (six) hours as needed for pain.  30 tablet  0  . lidocaine-prilocaine (EMLA) cream Apply topically as needed.  30 g  6  . loperamide (IMODIUM A-D) 2 MG tablet Take 2 tab at onset of diarrhea, then 1 tab q2h until 12 hr have passed without a BM.  May take 2 tab q4h qhs. If diarrhea recurs repeat.  60 tablet  4  . loperamide (IMODIUM) 2 MG capsule Take 2 capsules by mouth Every 4 hours as needed.      Marland Kitchen LORazepam (ATIVAN) 0.5 MG tablet Take 1 tablet (0.5 mg total) by mouth every 6 (six) hours as needed for anxiety.  30 tablet  0  . megestrol (MEGACE) 400 MG/10ML suspension Take 10 mLs (400 mg total) by mouth 2 (two) times daily.  240 mL  1  . Multiple Vitamin (MULTIVITAMIN) tablet Take 1 tablet by mouth every morning.       . ondansetron (ZOFRAN) 8 MG tablet Take 1 tablet two times a day starting the day after chemo for 3 days. Then take 1 tablet two times a day as needed for nausea or vomiting.  30 tablet  1  . pantoprazole (PROTONIX) 40 MG tablet Take 40 mg by mouth daily.      . polysaccharide iron  (NIFEREX) 150 MG CAPS capsule Take 150 mg by mouth every morning.       . potassium chloride SA (K-DUR,KLOR-CON) 20 MEQ tablet Take 1 tablet (20 mEq total) by mouth daily.  30 tablet  6  . prochlorperazine (COMPAZINE) 10 MG tablet Take 1 tablet (10 mg total) by mouth every 6 (six) hours as needed (Nausea or vomiting).  30 tablet  1  . prochlorperazine (COMPAZINE) 25 MG suppository Place 1 suppository (25 mg total) rectally every 12 (twelve) hours as needed for nausea.  12 suppository  3  . simethicone (MYLICON) 80 MG chewable tablet Chew 80 mg by mouth every 6 (six) hours as needed. For gas       No current facility-administered medications for this visit.   Facility-Administered Medications Ordered in Other Visits  Medication Dose Route Frequency Provider  Last Rate Last Dose  . heparin lock flush 100 unit/mL  500 Units Intravenous Once Victorino December, MD      . sodium chloride 0.9 % injection 10 mL  10 mL Intravenous PRN Victorino December, MD        SURGICAL HISTORY:  Past Surgical History  Procedure Date  . Tubal ligation   . Carpal tunnel release   . Colonoscopy 07/26/2011    Procedure: COLONOSCOPY;  Surgeon: Arlyce Harman, MD;  Location: [facility];  Service: Endoscopy;  Laterality: N/A;  10:40  . Esophagogastroduodenoscopy 07/26/2011    Procedure: ESOPHAGOGASTRODUODENOSCOPY (EGD);  Surgeon: Arlyce Harman, MD;  Location: [facility];  Service: Endoscopy;  Laterality: N/A;  . Esophagogastroduodenoscopy 12/27/2011    Procedure: ESOPHAGOGASTRODUODENOSCOPY (EGD);  Surgeon: Shirley Friar, MD;  Location: [facility];  Service: Endoscopy;  Laterality: N/A;  . Laparoscopic colon resection 12/14/11    diverting ileostomy  . Portacath placement 02/21/2012    Procedure: INSERTION PORT-A-CATH;  Surgeon: [name], MD;  Location: [facility] OR;  Service: General;  Laterality: N/A;    REVIEW OF SYSTEMS:   General: fatigue (+), night sweats (-), fever (-), pain (-) Lymph: palpable nodes  (-) HEENT: vision changes (-), mucositis (-), gum bleeding (-), epistaxis (-) Cardiovascular: chest pain (-), palpitations (-) Pulmonary: shortness of breath (-), dyspnea on exertion (-), cough (-), hemoptysis (-) GI:  Early satiety (-), melena (-), dysphagia (-), nausea/vomiting (-), diarrhea (+) GU: dysuria (-), hematuria (-), incontinence (-) Musculoskeletal: joint swelling (-), joint pain (-), back pain (-) Neuro: weakness (-), numbness (-), headache (-), confusion (-) Skin: Rash (-), lesions (-), dryness (-) Psych: depression (-), suicidal/homicidal ideation (-), feeling of hopelessness (-)   PHYSICAL EXAMINATION:  BP 109/69  Pulse 71  Temp 98.4 F (36.9 C) (Oral)  Resp 20  Ht 4\' 9"  (1.448 m)  Wt 79 lb 6.4 oz (36.016 kg)  BMI 17.18 kg/m2  LMP 02/20/2012 Gen.: Patient is a thin female but in no acute distress HEENT: PERRLA sclerae anicteric no conjunctival pallor oral mucosa is moist no thrush Neck: supple, no palpable adenopathy Lungs: clear to auscultation, no wheezes, rhonchi, or rales Cardiovascular: regular rate rhythm, S1, S2, no murmurs, rubs or gallops Abdomen: Soft, non-tender, non-distended, normoactive bowel sounds, no HSM Stoma site is pink Extremities: warm and well perfused, no clubbing, cyanosis, or edema Skin: No rashes or lesions ECOG PERFORMANCE STATUS: 1 - Symptomatic but completely ambulatory    LABORATORY DATA: Lab Results  Component Value Date   WBC 22.5* 07/16/2012   HGB 10.3* 07/16/2012   HCT 31.4* 07/16/2012   MCV 92.4 07/16/2012   PLT 150 07/16/2012       Chemistry      Component Value Date/Time   NA 140 07/09/2012 1131   NA 139 05/06/2012 1316   K 3.2* 07/09/2012 1131   K 3.3* 05/06/2012 1316   CL 105 07/09/2012 1131   CL 104 05/06/2012 1316   CO2 25 07/09/2012 1131   CO2 27 05/06/2012 1316   BUN 7.0 07/09/2012 1131   BUN 8 05/06/2012 1316   CREATININE 0.7 07/09/2012 1131   CREATININE 0.70 05/06/2012 1316   CREATININE 0.70  07/27/2011 1306      Component Value Date/Time   CALCIUM 9.2 07/09/2012 1131   CALCIUM 9.0 05/06/2012 1316   ALKPHOS 149 07/09/2012 1131   ALKPHOS 81 05/06/2012 1316   AST 14 07/09/2012 1131   AST 55* 05/06/2012 1316   ALT  15 07/09/2012 1131   ALT 44* 05/06/2012 1316   BILITOT 1.00 07/09/2012 1131   BILITOT 0.7 05/06/2012 1316     ADDITIONAL INFORMATION: 1. Following placement in clearing solution, three more lymph nodes were identified, all of which are benign. Thus, the total lymph node count is 11. (JK:mw 12-18-11) 2. Following an exhaustive search, additional tissue has been submitted in an attempt to recover lymph nodes. Two more lymph nodes were identified, both benign. This brings the total number of lymph nodes recovered to 13, all of which are benign. (JBK:eps 12/26/11) FINAL DIAGNOSIS Diagnosis 1. Colon, resection margin (donut), distal rectal - COLORECTAL MUCOSA WITH FIBROSIS AND EDEMA, CONSISTENT WITH TREATMENT. - THERE IS NO EVIDENCE OF MALIGNANCY. 2. Colon, total resection (incl lymph nodes) - COLON WITH SCATTERED TUBULAR ADENOMAS. - HIGH GRADE DYSPLASIA IS NOT IDENTIFIED. - BENIGN APPENDIX WITH FIBROUS OBLITERATION OF THE TIP. - THERE IS NO EVIDENCE OF CARCINOMA IN 8 OF 8 LYMPH NODES (0/8). - SEE ONCOLOGY TABLE BELOW. 3. Colon, resection margin (donut), new distal margin and rectum - BENIGN SQUAMOUS LINED MUCOSA. - THERE IS NO EVIDENCE OF MALIGNANCY. Microscopic Comment 2. COLON AND RECTUM Specimen: Colon, appendix, and terminal ileum. Procedure: Total colectomy. Tumor site: N/A Specimen integrity: Intact. Macroscopic intactness of mesorectum: Complete. Macroscopic tumor perforation: N/A Invasive tumor: N/A 1 of 3 FINAL for MALAYSIAH, HUGER ([id number]) Microscopic Comment(continued) Microscopic extension of invasive tumor: N/A Lymph-Vascular invasion: N/A Peri-neural invasion: N/A Tumor deposit(s) (discontinuous extramural extension): N/A Resection margins:  Negative for adenocarcinoma. Treatment effect (neoadjuvant therapy): Present, significant. Number of lymph nodes examined- 8 ; Number positive - 0 (PLEASE NOTE: Additional tissue will be submitted in an attempt to recover more lymph nodes) Additional polyp(s): Multiple scattered tubular adenomas. Pathologic Staging: ypT0, ypN0 Ancillary studies: N/A Comments: Grossly, there is a 1.5 cm granular depressed area of mucosa present at the distal portion of the specimen. This entire area has been submitted for histologic evaluation revealing inflamed, fibrotic, and edematous colorectal mucosa, consistent with treatment effect. Adenocarcinoma is not identified. In addition, there are scattered tubular adenomas throughout the specimen. No high grade dysplasia is identified. The case was discussed with Dr. Donell Beers on 12/15/2011. (JBK:gt, 12/15/11) Pecola Leisure MD Pathologist, Electronic Signature (Case signed 12/15/2011) Intraoper   ASSESSMENT: 46 year old female with:  #1. stage III adenocarcinoma of the rectum. Patient is now status post definitive surgery after having received neoadjuvant chemotherapy and radiation therapy. Her final pathology does not reveal any evidence of residual disease. 8 nodes were negative for metastatic disease.  #2 . Patient was begun on adjuvant chemotherapy consisting of Xeloda and Oxaliplatin. She received one cycle. This course was complicated by development of a severe dehydration requiring hospitalization.  #3 Patient and I discussed rationale doing FOLFIRI. Risks and benefits of both 5-FU leucovorin and irinotecan were discussed with the patient. She understands literature was given to her.  A total of 4 cycles will be planned.  She received her first cycle on 06/04/12   PLAN: #1 patient will proceed with scheduled cycle 4 of adjuvant FOLFIRI.  #2 we will give her IV fluids starting tomorrow through next week. This does help her considerably especially in light of  the fact that she does have high output from her ostomy and she developed significant diarrhea and nausea with poor oral intake.  #3 patient will be seen back in one week's time All questions were answered. The patient knows to call the clinic with any problems, questions or concerns. We  can certainly see the patient much sooner if necessary.  I spent >25 minutes counseling the patient face to face. The total time spent in the appointment was 30 minutes.  Drue Second, MD Medical/Oncology [facility] [phone] (beeper) [phone] (Office)  07/16/2012, 9:18 AM

## 2012-07-16 NOTE — Telephone Encounter (Signed)
Per staff message and POF I have scheduled appts.  JMW  

## 2012-07-16 NOTE — Patient Instructions (Signed)
[facility] Discharge Instructions for Patients Receiving Chemotherapy  Today you received the following chemotherapy agents Irinotecan, Leucovorin and 5FU, as well as IV Fluids  To help prevent nausea and vomiting after your treatment, we encourage you to take your nausea medication as prescribed.   If you develop nausea and vomiting that is not controlled by your nausea medication, call the clinic. If it is after clinic hours your family physician or the after hours number for the clinic or go to the Emergency Department.   BELOW ARE SYMPTOMS THAT SHOULD BE REPORTED IMMEDIATELY:  *FEVER GREATER THAN 100.5 F  *CHILLS WITH OR WITHOUT FEVER  NAUSEA AND VOMITING THAT IS NOT CONTROLLED WITH YOUR NAUSEA MEDICATION  *UNUSUAL SHORTNESS OF BREATH  *UNUSUAL BRUISING OR BLEEDING  TENDERNESS IN MOUTH AND THROAT WITH OR WITHOUT PRESENCE OF ULCERS  *URINARY PROBLEMS  *BOWEL PROBLEMS  UNUSUAL RASH Items with * indicate a potential emergency and should be followed up as soon as possible.  Feel free to call the clinic you have any questions or concerns. The clinic phone number is [phone].   I have been informed and understand all the instructions given to me. I know to contact the clinic, my physician, or go to the Emergency Department if any problems should occur. I do not have any questions at this time, but understand that I may call the clinic during office hours   should I have any questions or need assistance in obtaining follow up care.

## 2012-07-17 ENCOUNTER — Ambulatory Visit (HOSPITAL_BASED_OUTPATIENT_CLINIC_OR_DEPARTMENT_OTHER): Payer: BC Managed Care – PPO

## 2012-07-17 VITALS — BP 126/75 | HR 76 | Temp 98.1°F | Resp 20

## 2012-07-17 DIAGNOSIS — E860 Dehydration: Secondary | ICD-10-CM

## 2012-07-17 DIAGNOSIS — C20 Malignant neoplasm of rectum: Secondary | ICD-10-CM

## 2012-07-17 MED ORDER — SODIUM CHLORIDE 0.9 % IV SOLN
INTRAVENOUS | Status: DC
  Administered 2012-07-17: 10:00:00 via INTRAVENOUS

## 2012-07-17 NOTE — Patient Instructions (Signed)
Dehydration, Adult Dehydration means your body does not have as much fluid as it needs. Your kidneys, brain, and heart will not work properly without the right amount of fluids and salt.  HOME CARE  Ask your doctor how to replace body fluid losses (rehydrate).  Drink enough fluids to keep your pee (urine) clear or pale yellow.  Drink small amounts of fluids often if you feel sick to your stomach (nauseous) or throw up (vomit).  Eat like you normally do.  Avoid:  Foods or drinks high in sugar.  Bubbly (carbonated) drinks.  Juice.  Very hot or cold fluids.  Drinks with caffeine.  Fatty, greasy foods.  Alcohol.  Tobacco.  Eating too much.  Gelatin desserts.  Wash your hands to avoid spreading germs (bacteria, viruses).  Only take medicine as told by your doctor.  Keep all doctor visits as told. GET HELP RIGHT AWAY IF:   You cannot drink something without throwing up.  You get worse even with treatment.  Your vomit has blood in it or looks greenish.  Your poop (stool) has blood in it or looks black and tarry.  You have not peed in 6 to 8 hours.  You pee a small amount of very dark pee.  You have a fever.  You pass out (faint).  You have belly (abdominal) pain that gets worse or stays in one spot (localizes).  You have a rash, stiff neck, or bad headache.  You get easily annoyed, sleepy, or are hard to wake up.  You feel weak, dizzy, or very thirsty. MAKE SURE YOU:   Understand these instructions.  Will watch your condition.  Will get help right away if you are not doing well or get worse. Document Released: 07/08/2009 Document Revised: 12/04/2011 Document Reviewed: 05/01/2011 ExitCare Patient Information 2013 [employer].  

## 2012-07-18 ENCOUNTER — Ambulatory Visit (HOSPITAL_BASED_OUTPATIENT_CLINIC_OR_DEPARTMENT_OTHER): Payer: BC Managed Care – PPO

## 2012-07-18 DIAGNOSIS — Z5189 Encounter for other specified aftercare: Secondary | ICD-10-CM

## 2012-07-18 DIAGNOSIS — C20 Malignant neoplasm of rectum: Secondary | ICD-10-CM

## 2012-07-18 MED ORDER — PEGFILGRASTIM INJECTION 6 MG/0.6ML
6.0000 mg | Freq: Once | SUBCUTANEOUS | Status: AC
  Administered 2012-07-18: 6 mg via SUBCUTANEOUS
  Filled 2012-07-18: qty 0.6

## 2012-07-18 MED ORDER — SODIUM CHLORIDE 0.9 % IV SOLN
INTRAVENOUS | Status: DC
  Administered 2012-07-18: 12:00:00 via INTRAVENOUS

## 2012-07-18 NOTE — Patient Instructions (Addendum)
Dehydration, Adult Dehydration is when you lose more fluids from the body than you take in. Vital organs like the kidneys, brain, and heart cannot function without a proper amount of fluids and salt. Any loss of fluids from the body can cause dehydration.  CAUSES   Vomiting.  Diarrhea.  Excessive sweating.  Excessive urine output.  Fever. SYMPTOMS  Mild dehydration  Thirst.  Dry lips.  Slightly dry mouth. Moderate dehydration  Very dry mouth.  Sunken eyes.  Skin does not bounce back quickly when lightly pinched and released.  Dark urine and decreased urine production.  Decreased tear production.  Headache. Severe dehydration  Very dry mouth.  Extreme thirst.  Rapid, weak pulse (more than 100 beats per minute at rest).  Cold hands and feet.  Not able to sweat in spite of heat and temperature.  Rapid breathing.  Blue lips.  Confusion and lethargy.  Difficulty being awakened.  Minimal urine production.  No tears. DIAGNOSIS  Your caregiver will diagnose dehydration based on your symptoms and your exam. Blood and urine tests will help confirm the diagnosis. The diagnostic evaluation should also identify the cause of dehydration. TREATMENT  Treatment of mild or moderate dehydration can often be done at home by increasing the amount of fluids that you drink. It is best to drink small amounts of fluid more often. Drinking too much at one time can make vomiting worse. Refer to the home care instructions below. Severe dehydration needs to be treated at the hospital where you will probably be given intravenous (IV) fluids that contain water and electrolytes. HOME CARE INSTRUCTIONS   Ask your caregiver about specific rehydration instructions.  Drink enough fluids to keep your urine clear or pale yellow.  Drink small amounts frequently if you have nausea and vomiting.  Eat as you normally do.  Avoid:  Foods or drinks high in sugar.  Carbonated  drinks.  Juice.  Extremely hot or cold fluids.  Drinks with caffeine.  Fatty, greasy foods.  Alcohol.  Tobacco.  Overeating.  Gelatin desserts.  Wash your hands well to avoid spreading bacteria and viruses.  Only take over-the-counter or prescription medicines for pain, discomfort, or fever as directed by your caregiver.  Ask your caregiver if you should continue all prescribed and over-the-counter medicines.  Keep all follow-up appointments with your caregiver. SEEK MEDICAL CARE IF:  You have abdominal pain and it increases or stays in one area (localizes).  You have a rash, stiff neck, or severe headache.  You are irritable, sleepy, or difficult to awaken.  You are weak, dizzy, or extremely thirsty. SEEK IMMEDIATE MEDICAL CARE IF:   You are unable to keep fluids down or you get worse despite treatment.  You have frequent episodes of vomiting or diarrhea.  You have blood or green matter (bile) in your vomit.  You have blood in your stool or your stool looks black and tarry.  You have not urinated in 6 to 8 hours, or you have only urinated a small amount of very dark urine.  You have a fever.  You faint. MAKE SURE YOU:   Understand these instructions.  Will watch your condition.  Will get help right away if you are not doing well or get worse. Document Released: 09/11/2005 Document Revised: 12/04/2011 Document Reviewed: 05/01/2011 [employer] Patient Information 2013 [employer].  

## 2012-07-18 NOTE — Progress Notes (Signed)
Patient reports pump beeping yesterday.   17.4 mL left to administer.  Per Candida Peeling, NP, ok to bolus 6 mL and disconnect.  Balance not delivered 11.4 mL.   Patient reports severe cramping and gas yesterday afternoon.

## 2012-07-19 ENCOUNTER — Ambulatory Visit (HOSPITAL_BASED_OUTPATIENT_CLINIC_OR_DEPARTMENT_OTHER): Payer: BC Managed Care – PPO

## 2012-07-19 ENCOUNTER — Telehealth: Payer: Self-pay | Admitting: *Deleted

## 2012-07-19 VITALS — BP 110/76 | HR 75 | Temp 97.1°F

## 2012-07-19 DIAGNOSIS — C20 Malignant neoplasm of rectum: Secondary | ICD-10-CM

## 2012-07-19 DIAGNOSIS — Z932 Ileostomy status: Secondary | ICD-10-CM

## 2012-07-19 DIAGNOSIS — E860 Dehydration: Secondary | ICD-10-CM

## 2012-07-19 DIAGNOSIS — R198 Other specified symptoms and signs involving the digestive system and abdomen: Secondary | ICD-10-CM

## 2012-07-19 MED ORDER — SODIUM CHLORIDE 0.9 % IV SOLN
INTRAVENOUS | Status: DC
  Administered 2012-07-19: 11:00:00 via INTRAVENOUS

## 2012-07-19 MED ORDER — SODIUM CHLORIDE 0.9 % IJ SOLN
10.0000 mL | INTRAMUSCULAR | Status: DC | PRN
  Administered 2012-07-19: 10 mL via INTRAVENOUS
  Filled 2012-07-19: qty 10

## 2012-07-19 MED ORDER — HEPARIN SOD (PORK) LOCK FLUSH 100 UNIT/ML IV SOLN
500.0000 [IU] | Freq: Once | INTRAVENOUS | Status: AC
  Administered 2012-07-19: 500 [IU] via INTRAVENOUS
  Filled 2012-07-19: qty 5

## 2012-07-19 NOTE — Telephone Encounter (Signed)
Per patient request I have canceled appt for tomorrow. JMW

## 2012-07-19 NOTE — Patient Instructions (Signed)
Dehydration, Adult Dehydration is when you lose more fluids from the body than you take in. Vital organs like the kidneys, brain, and heart cannot function without a proper amount of fluids and salt. Any loss of fluids from the body can cause dehydration.  CAUSES   Vomiting.  Diarrhea.  Excessive sweating.  Excessive urine output.  Fever. SYMPTOMS  Mild dehydration  Thirst.  Dry lips.  Slightly dry mouth. Moderate dehydration  Very dry mouth.  Sunken eyes.  Skin does not bounce back quickly when lightly pinched and released.  Dark urine and decreased urine production.  Decreased tear production.  Headache. Severe dehydration  Very dry mouth.  Extreme thirst.  Rapid, weak pulse (more than 100 beats per minute at rest).  Cold hands and feet.  Not able to sweat in spite of heat and temperature.  Rapid breathing.  Blue lips.  Confusion and lethargy.  Difficulty being awakened.  Minimal urine production.  No tears. DIAGNOSIS  Your caregiver will diagnose dehydration based on your symptoms and your exam. Blood and urine tests will help confirm the diagnosis. The diagnostic evaluation should also identify the cause of dehydration. TREATMENT  Treatment of mild or moderate dehydration can often be done at home by increasing the amount of fluids that you drink. It is best to drink small amounts of fluid more often. Drinking too much at one time can make vomiting worse. Refer to the home care instructions below. Severe dehydration needs to be treated at the hospital where you will probably be given intravenous (IV) fluids that contain water and electrolytes. HOME CARE INSTRUCTIONS   Ask your caregiver about specific rehydration instructions.  Drink enough fluids to keep your urine clear or pale yellow.  Drink small amounts frequently if you have nausea and vomiting.  Eat as you normally do.  Avoid:  Foods or drinks high in sugar.  Carbonated  drinks.  Juice.  Extremely hot or cold fluids.  Drinks with caffeine.  Fatty, greasy foods.  Alcohol.  Tobacco.  Overeating.  Gelatin desserts.  Wash your hands well to avoid spreading bacteria and viruses.  Only take over-the-counter or prescription medicines for pain, discomfort, or fever as directed by your caregiver.  Ask your caregiver if you should continue all prescribed and over-the-counter medicines.  Keep all follow-up appointments with your caregiver. SEEK MEDICAL CARE IF:  You have abdominal pain and it increases or stays in one area (localizes).  You have a rash, stiff neck, or severe headache.  You are irritable, sleepy, or difficult to awaken.  You are weak, dizzy, or extremely thirsty. SEEK IMMEDIATE MEDICAL CARE IF:   You are unable to keep fluids down or you get worse despite treatment.  You have frequent episodes of vomiting or diarrhea.  You have blood or green matter (bile) in your vomit.  You have blood in your stool or your stool looks black and tarry.  You have not urinated in 6 to 8 hours, or you have only urinated a small amount of very dark urine.  You have a fever.  You faint. MAKE SURE YOU:   Understand these instructions.  Will watch your condition.  Will get help right away if you are not doing well or get worse. Document Released: 09/11/2005 Document Revised: 12/04/2011 Document Reviewed: 05/01/2011 [employer] Patient Information 2013 [employer].  

## 2012-07-20 ENCOUNTER — Ambulatory Visit: Payer: BC Managed Care – PPO

## 2012-07-22 ENCOUNTER — Ambulatory Visit (HOSPITAL_BASED_OUTPATIENT_CLINIC_OR_DEPARTMENT_OTHER): Payer: BC Managed Care – PPO

## 2012-07-22 VITALS — BP 100/67 | HR 89 | Temp 98.1°F | Resp 20

## 2012-07-22 DIAGNOSIS — C20 Malignant neoplasm of rectum: Secondary | ICD-10-CM

## 2012-07-22 DIAGNOSIS — R110 Nausea: Secondary | ICD-10-CM

## 2012-07-22 DIAGNOSIS — R198 Other specified symptoms and signs involving the digestive system and abdomen: Secondary | ICD-10-CM

## 2012-07-22 DIAGNOSIS — R197 Diarrhea, unspecified: Secondary | ICD-10-CM

## 2012-07-22 MED ORDER — SODIUM CHLORIDE 0.9 % IV SOLN
Freq: Once | INTRAVENOUS | Status: AC
  Administered 2012-07-22: 11:00:00 via INTRAVENOUS

## 2012-07-22 NOTE — Patient Instructions (Signed)
Dehydration, Adult Dehydration is when you lose more fluids from the body than you take in. Vital organs like the kidneys, brain, and heart cannot function without a proper amount of fluids and salt. Any loss of fluids from the body can cause dehydration.  CAUSES   Vomiting.  Diarrhea.  Excessive sweating.  Excessive urine output.  Fever. SYMPTOMS  Mild dehydration  Thirst.  Dry lips.  Slightly dry mouth. Moderate dehydration  Very dry mouth.  Sunken eyes.  Skin does not bounce back quickly when lightly pinched and released.  Dark urine and decreased urine production.  Decreased tear production.  Headache. Severe dehydration  Very dry mouth.  Extreme thirst.  Rapid, weak pulse (more than 100 beats per minute at rest).  Cold hands and feet.  Not able to sweat in spite of heat and temperature.  Rapid breathing.  Blue lips.  Confusion and lethargy.  Difficulty being awakened.  Minimal urine production.  No tears. DIAGNOSIS  Your caregiver will diagnose dehydration based on your symptoms and your exam. Blood and urine tests will help confirm the diagnosis. The diagnostic evaluation should also identify the cause of dehydration. TREATMENT  Treatment of mild or moderate dehydration can often be done at home by increasing the amount of fluids that you drink. It is best to drink small amounts of fluid more often. Drinking too much at one time can make vomiting worse. Refer to the home care instructions below. Severe dehydration needs to be treated at the hospital where you will probably be given intravenous (IV) fluids that contain water and electrolytes. HOME CARE INSTRUCTIONS   Ask your caregiver about specific rehydration instructions.  Drink enough fluids to keep your urine clear or pale yellow.  Drink small amounts frequently if you have nausea and vomiting.  Eat as you normally do.  Avoid:  Foods or drinks high in sugar.  Carbonated  drinks.  Juice.  Extremely hot or cold fluids.  Drinks with caffeine.  Fatty, greasy foods.  Alcohol.  Tobacco.  Overeating.  Gelatin desserts.  Wash your hands well to avoid spreading bacteria and viruses.  Only take over-the-counter or prescription medicines for pain, discomfort, or fever as directed by your caregiver.  Ask your caregiver if you should continue all prescribed and over-the-counter medicines.  Keep all follow-up appointments with your caregiver. SEEK MEDICAL CARE IF:  You have abdominal pain and it increases or stays in one area (localizes).  You have a rash, stiff neck, or severe headache.  You are irritable, sleepy, or difficult to awaken.  You are weak, dizzy, or extremely thirsty. SEEK IMMEDIATE MEDICAL CARE IF:   You are unable to keep fluids down or you get worse despite treatment.  You have frequent episodes of vomiting or diarrhea.  You have blood or green matter (bile) in your vomit.  You have blood in your stool or your stool looks black and tarry.  You have not urinated in 6 to 8 hours, or you have only urinated a small amount of very dark urine.  You have a fever.  You faint. MAKE SURE YOU:   Understand these instructions.  Will watch your condition.  Will get help right away if you are not doing well or get worse. Document Released: 09/11/2005 Document Revised: 12/04/2011 Document Reviewed: 05/01/2011 [employer] Patient Information 2013 [employer].  

## 2012-07-23 ENCOUNTER — Other Ambulatory Visit: Payer: BC Managed Care – PPO

## 2012-07-23 ENCOUNTER — Ambulatory Visit: Payer: BC Managed Care – PPO | Admitting: Adult Health

## 2012-07-23 ENCOUNTER — Ambulatory Visit (HOSPITAL_BASED_OUTPATIENT_CLINIC_OR_DEPARTMENT_OTHER): Payer: BC Managed Care – PPO

## 2012-07-23 ENCOUNTER — Telehealth: Payer: Self-pay | Admitting: *Deleted

## 2012-07-23 ENCOUNTER — Encounter: Payer: Self-pay | Admitting: Oncology

## 2012-07-23 ENCOUNTER — Ambulatory Visit (HOSPITAL_BASED_OUTPATIENT_CLINIC_OR_DEPARTMENT_OTHER): Payer: BC Managed Care – PPO | Admitting: Oncology

## 2012-07-23 ENCOUNTER — Other Ambulatory Visit (HOSPITAL_BASED_OUTPATIENT_CLINIC_OR_DEPARTMENT_OTHER): Payer: BC Managed Care – PPO | Admitting: Lab

## 2012-07-23 VITALS — BP 107/70 | HR 89 | Temp 98.1°F | Resp 20 | Ht <= 58 in | Wt 80.5 lb

## 2012-07-23 DIAGNOSIS — C20 Malignant neoplasm of rectum: Secondary | ICD-10-CM

## 2012-07-23 DIAGNOSIS — E860 Dehydration: Secondary | ICD-10-CM

## 2012-07-23 LAB — CBC WITH DIFFERENTIAL/PLATELET
BASO%: 0.3 % (ref 0.0–2.0)
Eosinophils Absolute: 0.2 10*3/uL (ref 0.0–0.5)
LYMPH%: 4.9 % — ABNORMAL LOW (ref 14.0–49.7)
MCHC: 34.1 g/dL (ref 31.5–36.0)
MONO#: 1.4 10*3/uL — ABNORMAL HIGH (ref 0.1–0.9)
NEUT#: 14 10*3/uL — ABNORMAL HIGH (ref 1.5–6.5)
Platelets: 71 10*3/uL — ABNORMAL LOW (ref 145–400)
RBC: 2.83 10*6/uL — ABNORMAL LOW (ref 3.70–5.45)
RDW: 16 % — ABNORMAL HIGH (ref 11.2–14.5)
WBC: 16.5 10*3/uL — ABNORMAL HIGH (ref 3.9–10.3)
lymph#: 0.8 10*3/uL — ABNORMAL LOW (ref 0.9–3.3)

## 2012-07-23 LAB — COMPREHENSIVE METABOLIC PANEL (CC13)
ALT: 19 U/L (ref 0–55)
Albumin: 3.7 g/dL (ref 3.5–5.0)
CO2: 27 mEq/L (ref 22–29)
Potassium: 3.3 mEq/L — ABNORMAL LOW (ref 3.5–5.1)
Sodium: 141 mEq/L (ref 136–145)
Total Bilirubin: 0.74 mg/dL (ref 0.20–1.20)
Total Protein: 6 g/dL — ABNORMAL LOW (ref 6.4–8.3)

## 2012-07-23 LAB — CEA: CEA: <0.5 ng/mL (ref 0.0–5.0)

## 2012-07-23 MED ORDER — SODIUM CHLORIDE 0.9 % IV SOLN
INTRAVENOUS | Status: DC
  Administered 2012-07-23: 10:00:00 via INTRAVENOUS

## 2012-07-23 NOTE — Telephone Encounter (Signed)
Gave patient appointments for 07-24-2012 07-25-2012 07-26-2012 fluids  Gave patient appointment for 08-21-2012 starting at 10:30am   Patient aware of all the appointments  Sent michelle email to set up patient's fluids

## 2012-07-23 NOTE — Telephone Encounter (Signed)
Per NP , notified pt to restart taking her potassium pills . Pt verbalized understanding. No further questions

## 2012-07-23 NOTE — Telephone Encounter (Signed)
Message copied by Cooper Render on Tue Jul 23, 2012  4:28 PM ------      Message from: Laural Golden      Created: Tue Jul 23, 2012  1:32 PM       Please call and have Mordecai Maes restart her potassium pills.              Thanks!      L      ----- Message -----         From: [facility]         Sent: 07/23/2012   9:27 AM           To: Victorino December, MD

## 2012-07-23 NOTE — Telephone Encounter (Signed)
Per staff message and POF I have scheduled appts.  JMW  

## 2012-07-23 NOTE — Patient Instructions (Addendum)
Doing well  I will continue to see you monthly  Your next appointment with me will be on 11/27  Call with any problems  IVF today, 10/30, 10/31, 11/1

## 2012-07-23 NOTE — Progress Notes (Signed)
OFFICE PROGRESS NOTE  CC: Renee Eva, MD Renee Herald, MD Renee Lint, MD Renee Nicholson., MD [address] 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  PRIOR THERAPY:   #1 patient presented with a several month history of diarrhea and then subsequent bleeding. She was found to be anemic with a hemoglobin of 10. Because of this she went on to have a colonoscopy performed. The colonoscopy showed multiple sessile polyps in the cecum ascending transverse and sigmoid colon as well as the rectum. The rectal area showed a large exophytic rectal mass approximately 1 cm above the dentate line measuring 3-4 cm. She had a biopsy of this performed and was found to be an adenocarcinoma consistent with a rectal primary.  #2 patient was seen by Dr. Carlynn Nicholson at [facility] who performed a rectal ultrasound. The staging clinically was T3 N1. It was recommended by Dr. Lennart Nicholson the patient undergo neoadjuvant chemotherapy and radiation higher to her definitive surgery.  #3 Patient has completed concurrent radiation and chemotherapy. Her chemotherapy consisted of single agent xeloda administrated 08/22/11 - 10/09/2011. Overall she tolerated her treatment very well  #4 Has completed all of neoadjuvant treatment And the PET scan shows no evidence of distant metastasis and a para rectal peritoneal nodes less impressive she has had significant response to therapy.  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 APC is negative  #6 patient had a laparotomy with proctocolectomy, J-pouch with ileoanal anastomosis, loop ileostomy on 12/14/2011. The final pathology did not reveal any evidence of residual disease. Patient had a  complicated postop course including high output ileostomy. She required intensive IV fluids on an outpatient basis to prevent dehydration.  #7 patient began adjuvant chemotherapy consisting of XELOX starting 03/05/2012 on a 21 day cycle. A Total of 6 cycles were planned unfortunate patient was not able to tolerate this therapy due to side effects from the xeloda With development of hand-foot syndrome.She was not able to tolerate oxaliplatin due to significant toxicity from cold sensitivity. In that this treatment is being discontinued.  #8 patient subsequently began adjuvant FOLFIRI beginning on 06/04/2012. She is planned for 4 cycles. She  completed all her chemotherapy on 07/16/12  CURRENT THERAPY: Observation  INTERVAL HISTORY: Renee Nicholson 46 y.o. female returns for followup visit today. Overall patient is doing well. She has now completed all of her adjuvant chemotherapy as of 07/16/2012. Essentially she tolerated very well without any significant problems. She has no fevers chills night sweats headaches shortness of breath chest pains palpitations she does continue to have some diarrhea. She is noted to have a small ulcer on the cheek. She is doing local therapy and it is improving. Remainder of the 10 point review of systems is negative.  MEDICAL HISTORY: Past Medical History  Diagnosis Date  . Anemia   . Diarrhea   . Rectal cancer 08/16/2011  . Colon cancer   . Anxiety   . Bright red rectal bleeding 09/13/2011  . History of chemotherapy     completed 09/2011   . Hx of radiation therapy 08/29/11 to 10/09/11    rectum    ALLERGIES:   has no known allergies.  MEDICATIONS:  Current Outpatient Prescriptions  Medication Sig Dispense  Refill  . acetaminophen (TYLENOL) 325 MG tablet Take 2 tablets (650 mg total) by mouth every 4 (four) hours as needed.  200 tablet  1  . dexamethasone (DECADRON) 4 MG tablet Take 2 tablets by mouth two times a day starting the day after chemotherapy for 3  days. Take with food.  30 tablet  1  . diphenoxylate-atropine (LOMOTIL) 2.5-0.025 MG per tablet Take 1 tablet by mouth 4 (four) times daily as needed for diarrhea or loose stools.  60 tablet  5  . feeding supplement (ENSURE COMPLETE) LIQD Take 237 mLs by mouth 3 (three) times daily between meals.  50 Bottle  6  . feeding supplement (RESOURCE BREEZE) LIQD Take 1 Container by mouth 3 (three) times daily between meals.  30 Container  6  . Ferrous Sulfate (IRON) 28 MG TABS Take 1 tablet by mouth daily.      . Heparin Lock Flush (HEPARIN FLUSH, PORCINE,) 100 UNIT/ML injection 1 mL (100 Units total) by Intracatheter route as needed (250 units per lumen).  5 Syringe  3  . HYDROcodone-acetaminophen (VICODIN) 5-500 MG per tablet Take 1-2 tablets by mouth every 6 (six) hours as needed for pain.  30 tablet  0  . lidocaine-prilocaine (EMLA) cream Apply topically as needed.  30 g  6  . loperamide (IMODIUM A-D) 2 MG tablet Take 2 tab at onset of diarrhea, then 1 tab q2h until 12 hr have passed without a BM.  May take 2 tab q4h qhs. If diarrhea recurs repeat.  60 tablet  4  . loperamide (IMODIUM) 2 MG capsule Take 2 capsules by mouth Every 4 hours as needed.      Marland Kitchen LORazepam (ATIVAN) 0.5 MG tablet Take 1 tablet (0.5 mg total) by mouth every 6 (six) hours as needed for anxiety.  30 tablet  0  . megestrol (MEGACE) 400 MG/10ML suspension Take 10 mLs (400 mg total) by mouth 2 (two) times daily.  240 mL  1  . Multiple Vitamin (MULTIVITAMIN) tablet Take 1 tablet by mouth every morning.       . ondansetron (ZOFRAN) 8 MG tablet Take 1 tablet two times a day starting the day after chemo for 3 days. Then take 1 tablet two times a day as needed for nausea or vomiting.  30 tablet  1  . pantoprazole (PROTONIX) 40 MG tablet Take 40 mg by mouth daily.      . polysaccharide iron (NIFEREX) 150 MG CAPS capsule Take 150 mg by mouth every morning.       . potassium chloride SA (K-DUR,KLOR-CON) 20 MEQ tablet Take 1 tablet (20 mEq  total) by mouth daily.  30 tablet  6  . prochlorperazine (COMPAZINE) 10 MG tablet Take 1 tablet (10 mg total) by mouth every 6 (six) hours as needed (Nausea or vomiting).  30 tablet  1  . prochlorperazine (COMPAZINE) 25 MG suppository Place 1 suppository (25 mg total) rectally every 12 (twelve) hours as needed for nausea.  12 suppository  3  . simethicone (MYLICON) 80 MG chewable tablet Chew 80 mg by mouth every 6 (six) hours as needed. For gas       No current facility-administered medications for this visit.   Facility-Administered Medications Ordered in Other Visits  Medication Dose Route Frequency Provider Last Rate Last Dose  . 0.9 %  sodium chloride infusion   Intravenous Once Victorino December, MD      . heparin lock flush 100 unit/mL  500 Units Intravenous Once  Victorino December, MD      . sodium chloride 0.9 % injection 10 mL  10 mL Intravenous PRN Victorino December, MD        SURGICAL HISTORY:  Past Surgical History  Procedure Date  . Tubal ligation   . Carpal tunnel release   . Colonoscopy 07/26/2011    Procedure: COLONOSCOPY;  Surgeon: Arlyce Harman, MD;  Location: [facility];  Service: Endoscopy;  Laterality: N/A;  10:40  . Esophagogastroduodenoscopy 07/26/2011    Procedure: ESOPHAGOGASTRODUODENOSCOPY (EGD);  Surgeon: Arlyce Harman, MD;  Location: [facility];  Service: Endoscopy;  Laterality: N/A;  . Esophagogastroduodenoscopy 12/27/2011    Procedure: ESOPHAGOGASTRODUODENOSCOPY (EGD);  Surgeon: Shirley Friar, MD;  Location: [facility];  Service: Endoscopy;  Laterality: N/A;  . Laparoscopic colon resection 12/14/11    diverting ileostomy  . Portacath placement 02/21/2012    Procedure: INSERTION PORT-A-CATH;  Surgeon: Renee Lint, MD;  Location: [facility] OR;  Service: General;  Laterality: N/A;    REVIEW OF SYSTEMS:   General: fatigue (+), night sweats (-), fever (-), pain (-) Lymph: palpable nodes (-) HEENT: vision changes (-), mucositis (-), gum bleeding (-), epistaxis  (-) Cardiovascular: chest pain (-), palpitations (-) Pulmonary: shortness of breath (-), dyspnea on exertion (-), cough (-), hemoptysis (-) GI:  Early satiety (-), melena (-), dysphagia (-), nausea/vomiting (-), diarrhea (+) GU: dysuria (-), hematuria (-), incontinence (-) Musculoskeletal: joint swelling (-), joint pain (-), back pain (-) Neuro: weakness (-), numbness (-), headache (-), confusion (-) Skin: Rash (-), lesions (-), dryness (-) Psych: depression (-), suicidal/homicidal ideation (-), feeling of hopelessness (-)   PHYSICAL EXAMINATION:  BP 107/70  Pulse 89  Temp 98.1 F (36.7 C) (Oral)  Resp 20  Ht 4\' 9"  (1.448 m)  Wt 80 lb 8 oz (36.515 kg)  BMI 17.42 kg/m2  LMP 02/20/2012 Gen.: Patient is a thin female but in no acute distress HEENT: PERRLA sclerae anicteric no conjunctival pallor oral mucosa is moist no thrush Neck: supple, no palpable adenopathy Lungs: clear to auscultation, no wheezes, rhonchi, or rales Cardiovascular: regular rate rhythm, S1, S2, no murmurs, rubs or gallops Abdomen: Soft, non-tender, non-distended, normoactive bowel sounds, no HSM Stoma site is pink Extremities: warm and well perfused, no clubbing, cyanosis, or edema Skin: No rashes or lesions ECOG PERFORMANCE STATUS: 1 - Symptomatic but completely ambulatory    LABORATORY DATA: Lab Results  Component Value Date   WBC 16.5* 07/23/2012   HGB 9.0* 07/23/2012   HCT 26.4* 07/23/2012   MCV 93.5 07/23/2012   PLT 71* 07/23/2012       Chemistry      Component Value Date/Time   NA 138 07/16/2012 0859   NA 139 05/06/2012 1316   K 3.9 07/16/2012 0859   K 3.3* 05/06/2012 1316   CL 103 07/16/2012 0859   CL 104 05/06/2012 1316   CO2 23 07/16/2012 0859   CO2 27 05/06/2012 1316   BUN 8.0 07/16/2012 0859   BUN 8 05/06/2012 1316   CREATININE 0.7 07/16/2012 0859   CREATININE 0.70 05/06/2012 1316   CREATININE 0.70 07/27/2011 1306      Component Value Date/Time   CALCIUM 8.7 07/16/2012 0859    CALCIUM 9.0 05/06/2012 1316   ALKPHOS 168* 07/16/2012 0859   ALKPHOS 81 05/06/2012 1316   AST 22 07/16/2012 0859   AST 55* 05/06/2012 1316   ALT 22 07/16/2012 0859   ALT 44* 05/06/2012 1316   BILITOT 0.50 07/16/2012 0859  BILITOT 0.7 05/06/2012 1316     ADDITIONAL INFORMATION: 1. Following placement in clearing solution, three more lymph nodes were identified, all of which are benign. Thus, the total lymph node count is 11. (JK:mw 12-18-11) 2. Following an exhaustive search, additional tissue has been submitted in an attempt to recover lymph nodes. Two more lymph nodes were identified, both benign. This brings the total number of lymph nodes recovered to 13, all of which are benign. (JBK:eps 12/26/11) FINAL DIAGNOSIS Diagnosis 1. Colon, resection margin (donut), distal rectal - COLORECTAL MUCOSA WITH FIBROSIS AND EDEMA, CONSISTENT WITH TREATMENT. - THERE IS NO EVIDENCE OF MALIGNANCY. 2. Colon, total resection (incl lymph nodes) - COLON WITH SCATTERED TUBULAR ADENOMAS. - HIGH GRADE DYSPLASIA IS NOT IDENTIFIED. - BENIGN APPENDIX WITH FIBROUS OBLITERATION OF THE TIP. - THERE IS NO EVIDENCE OF CARCINOMA IN 8 OF 8 LYMPH NODES (0/8). - SEE ONCOLOGY TABLE BELOW. 3. Colon, resection margin (donut), new distal margin and rectum - BENIGN SQUAMOUS LINED MUCOSA. - THERE IS NO EVIDENCE OF MALIGNANCY. Microscopic Comment 2. COLON AND RECTUM Specimen: Colon, appendix, and terminal ileum. Procedure: Total colectomy. Tumor site: N/A Specimen integrity: Intact. Macroscopic intactness of mesorectum: Complete. Macroscopic tumor perforation: N/A Invasive tumor: N/A 1 of 3 FINAL for IMA, HAFNER ([id number]) Microscopic Comment(continued) Microscopic extension of invasive tumor: N/A Lymph-Vascular invasion: N/A Peri-neural invasion: N/A Tumor deposit(s) (discontinuous extramural extension): N/A Resection margins: Negative for adenocarcinoma. Treatment effect (neoadjuvant therapy): Present,  significant. Number of lymph nodes examined- 8 ; Number positive - 0 (PLEASE NOTE: Additional tissue will be submitted in an attempt to recover more lymph nodes) Additional polyp(s): Multiple scattered tubular adenomas. Pathologic Staging: ypT0, ypN0 Ancillary studies: N/A Comments: Grossly, there is a 1.5 cm granular depressed area of mucosa present at the distal portion of the specimen. This entire area has been submitted for histologic evaluation revealing inflamed, fibrotic, and edematous colorectal mucosa, consistent with treatment effect. Adenocarcinoma is not identified. In addition, there are scattered tubular adenomas throughout the specimen. No high grade dysplasia is identified. The case was discussed with Dr. Donell Beers on 12/15/2011. (JBK:gt, 12/15/11) Pecola Leisure MD Pathologist, Electronic Signature (Case signed 12/15/2011) Intraoper   ASSESSMENT: 46 year old female with:  #1. stage III adenocarcinoma of the rectum. Patient is now status post definitive surgery after having received neoadjuvant chemotherapy and radiation therapy. Her final pathology does not reveal any evidence of residual disease. 8 nodes were negative for metastatic disease.  #2 . Patient was begun on adjuvant chemotherapy consisting of Xeloda and Oxaliplatin. She received one cycle. This course was complicated by development of a severe dehydration requiring hospitalization.  #3 Patient and I discussed rationale doing FOLFIRI. Risks and benefits of both 5-FU leucovorin and irinotecan were discussed with the patient. She understands literature was given to her.  A total of 4 cycles will be planned.  She received her first cycle on 06/04/12 - 07/16/12.  PLAN:  #1 patient now will continue to see me once a month in followup.  #2 she will also be seen by Dr. Donell Beers in November for reconsultation regarding the possibility of having a takedown of colostomy.  #3 patient knows to call me with any problems.  All  questions were answered. The patient knows to call the clinic with any problems, questions or concerns. We can certainly see the patient much sooner if necessary.  I spent >25 minutes counseling the patient face to face. The total time spent in the appointment was 30 minutes.  Drue Second, MD  Medical/Oncology [facility] [phone] (beeper) [phone] (Office)  07/23/2012, 9:47 AM

## 2012-07-23 NOTE — Progress Notes (Signed)
Pt in for fluids only, no nausea.  dmr

## 2012-07-24 ENCOUNTER — Ambulatory Visit (HOSPITAL_BASED_OUTPATIENT_CLINIC_OR_DEPARTMENT_OTHER): Payer: BC Managed Care – PPO

## 2012-07-24 VITALS — BP 76/49 | HR 76 | Temp 98.1°F | Resp 20

## 2012-07-24 DIAGNOSIS — C20 Malignant neoplasm of rectum: Secondary | ICD-10-CM

## 2012-07-24 DIAGNOSIS — R110 Nausea: Secondary | ICD-10-CM

## 2012-07-24 DIAGNOSIS — E860 Dehydration: Secondary | ICD-10-CM

## 2012-07-24 DIAGNOSIS — R197 Diarrhea, unspecified: Secondary | ICD-10-CM

## 2012-07-24 MED ORDER — SODIUM CHLORIDE 0.9 % IV SOLN
1000.0000 mL | Freq: Once | INTRAVENOUS | Status: AC
  Administered 2012-07-24: 1000 mL via INTRAVENOUS

## 2012-07-24 NOTE — Patient Instructions (Signed)
Dehydration, Adult Dehydration is when you lose more fluids from the body than you take in. Vital organs like the kidneys, brain, and heart cannot function without a proper amount of fluids and salt. Any loss of fluids from the body can cause dehydration.  CAUSES   Vomiting.  Diarrhea.  Excessive sweating.  Excessive urine output.  Fever. SYMPTOMS  Mild dehydration  Thirst.  Dry lips.  Slightly dry mouth. Moderate dehydration  Very dry mouth.  Sunken eyes.  Skin does not bounce back quickly when lightly pinched and released.  Dark urine and decreased urine production.  Decreased tear production.  Headache. Severe dehydration  Very dry mouth.  Extreme thirst.  Rapid, weak pulse (more than 100 beats per minute at rest).  Cold hands and feet.  Not able to sweat in spite of heat and temperature.  Rapid breathing.  Blue lips.  Confusion and lethargy.  Difficulty being awakened.  Minimal urine production.  No tears. DIAGNOSIS  Your caregiver will diagnose dehydration based on your symptoms and your exam. Blood and urine tests will help confirm the diagnosis. The diagnostic evaluation should also identify the cause of dehydration. TREATMENT  Treatment of mild or moderate dehydration can often be done at home by increasing the amount of fluids that you drink. It is best to drink small amounts of fluid more often. Drinking too much at one time can make vomiting worse. Refer to the home care instructions below. Severe dehydration needs to be treated at the hospital where you will probably be given intravenous (IV) fluids that contain water and electrolytes. HOME CARE INSTRUCTIONS   Ask your caregiver about specific rehydration instructions.  Drink enough fluids to keep your urine clear or pale yellow.  Drink small amounts frequently if you have nausea and vomiting.  Eat as you normally do.  Avoid:  Foods or drinks high in sugar.  Carbonated  drinks.  Juice.  Extremely hot or cold fluids.  Drinks with caffeine.  Fatty, greasy foods.  Alcohol.  Tobacco.  Overeating.  Gelatin desserts.  Wash your hands well to avoid spreading bacteria and viruses.  Only take over-the-counter or prescription medicines for pain, discomfort, or fever as directed by your caregiver.  Ask your caregiver if you should continue all prescribed and over-the-counter medicines.  Keep all follow-up appointments with your caregiver. SEEK MEDICAL CARE IF:  You have abdominal pain and it increases or stays in one area (localizes).  You have a rash, stiff neck, or severe headache.  You are irritable, sleepy, or difficult to awaken.  You are weak, dizzy, or extremely thirsty. SEEK IMMEDIATE MEDICAL CARE IF:   You are unable to keep fluids down or you get worse despite treatment.  You have frequent episodes of vomiting or diarrhea.  You have blood or green matter (bile) in your vomit.  You have blood in your stool or your stool looks black and tarry.  You have not urinated in 6 to 8 hours, or you have only urinated a small amount of very dark urine.  You have a fever.  You faint. MAKE SURE YOU:   Understand these instructions.  Will watch your condition.  Will get help right away if you are not doing well or get worse. Document Released: 09/11/2005 Document Revised: 12/04/2011 Document Reviewed: 05/01/2011 [employer] Patient Information 2013 [employer].  

## 2012-07-25 ENCOUNTER — Ambulatory Visit: Payer: BC Managed Care – PPO

## 2012-07-26 ENCOUNTER — Ambulatory Visit: Payer: BC Managed Care – PPO

## 2012-07-30 ENCOUNTER — Telehealth: Payer: Self-pay | Admitting: Oncology

## 2012-07-30 NOTE — Telephone Encounter (Signed)
Pt called in to make an appt with dr Welton Flakes. gve the pt her appt d/t over the telephone for 07/31/2012@9 :45am

## 2012-07-30 NOTE — Telephone Encounter (Signed)
This pt called to cancel her appts for 07/31/2012

## 2012-07-31 ENCOUNTER — Ambulatory Visit: Payer: BC Managed Care – PPO | Admitting: Oncology

## 2012-07-31 ENCOUNTER — Other Ambulatory Visit: Payer: BC Managed Care – PPO | Admitting: Lab

## 2012-08-02 ENCOUNTER — Observation Stay (HOSPITAL_COMMUNITY): Payer: BC Managed Care – PPO

## 2012-08-02 ENCOUNTER — Inpatient Hospital Stay (HOSPITAL_COMMUNITY)
Admission: EM | Admit: 2012-08-02 | Discharge: 2012-08-04 | DRG: 180 | Disposition: A | Discharge status: Home / Self Care | Payer: BC Managed Care – PPO | Attending: General Surgery | Admitting: General Surgery

## 2012-08-02 ENCOUNTER — Ambulatory Visit (INDEPENDENT_AMBULATORY_CARE_PROVIDER_SITE_OTHER): Payer: BC Managed Care – PPO | Admitting: General Surgery

## 2012-08-02 ENCOUNTER — Encounter (HOSPITAL_COMMUNITY): Payer: Self-pay | Admitting: Emergency Medicine

## 2012-08-02 VITALS — BP 118/78 | HR 100 | Temp 97.6°F | Resp 18 | Ht <= 58 in | Wt 78.8 lb

## 2012-08-02 DIAGNOSIS — Z9889 Other specified postprocedural states: Secondary | ICD-10-CM

## 2012-08-02 DIAGNOSIS — R1084 Generalized abdominal pain: Secondary | ICD-10-CM

## 2012-08-02 DIAGNOSIS — E079 Disorder of thyroid, unspecified: Secondary | ICD-10-CM | POA: Diagnosis present

## 2012-08-02 DIAGNOSIS — F411 Generalized anxiety disorder: Secondary | ICD-10-CM | POA: Diagnosis present

## 2012-08-02 DIAGNOSIS — Z923 Personal history of irradiation: Secondary | ICD-10-CM

## 2012-08-02 DIAGNOSIS — Z933 Colostomy status: Secondary | ICD-10-CM

## 2012-08-02 DIAGNOSIS — K56609 Unspecified intestinal obstruction, unspecified as to partial versus complete obstruction: Secondary | ICD-10-CM

## 2012-08-02 DIAGNOSIS — Z980 Intestinal bypass and anastomosis status: Secondary | ICD-10-CM

## 2012-08-02 DIAGNOSIS — E860 Dehydration: Secondary | ICD-10-CM | POA: Diagnosis present

## 2012-08-02 DIAGNOSIS — Z85038 Personal history of other malignant neoplasm of large intestine: Secondary | ICD-10-CM

## 2012-08-02 DIAGNOSIS — Z85048 Personal history of other malignant neoplasm of rectum, rectosigmoid junction, and anus: Secondary | ICD-10-CM

## 2012-08-02 DIAGNOSIS — R52 Pain, unspecified: Secondary | ICD-10-CM

## 2012-08-02 DIAGNOSIS — Z9221 Personal history of antineoplastic chemotherapy: Secondary | ICD-10-CM

## 2012-08-02 LAB — COMPREHENSIVE METABOLIC PANEL
ALT: 13 U/L (ref 0–35)
Alkaline Phosphatase: 148 U/L — ABNORMAL HIGH (ref 39–117)
GFR calc Af Amer: >90 mL/min (ref >90)
Glucose, Bld: 106 mg/dL — ABNORMAL HIGH (ref 70–99)
Potassium: 4 mEq/L (ref 3.5–5.1)
Sodium: 135 mEq/L (ref 135–145)
Total Protein: 6.8 g/dL (ref 6.0–8.3)

## 2012-08-02 LAB — BASIC METABOLIC PANEL
CO2: 27 mEq/L (ref 19–32)
Calcium: 9.8 mg/dL (ref 8.4–10.5)
Chloride: 99 mEq/L (ref 96–112)
Potassium: 3.8 mEq/L (ref 3.5–5.1)
Sodium: 138 mEq/L (ref 135–145)

## 2012-08-02 LAB — CBC WITH DIFFERENTIAL/PLATELET
Basophils Absolute: 0 10*3/uL (ref 0.0–0.1)
Eosinophils Relative: 0 % (ref 0–5)
Lymphocytes Relative: 3 % — ABNORMAL LOW (ref 12–46)
Neutro Abs: 22.4 10*3/uL — ABNORMAL HIGH (ref 1.7–7.7)
Neutrophils Relative %: 94 % — ABNORMAL HIGH (ref 43–77)
Platelets: 203 10*3/uL (ref 150–400)
RDW: 16.1 % — ABNORMAL HIGH (ref 11.5–15.5)
WBC: 24 10*3/uL — ABNORMAL HIGH (ref 4.0–10.5)

## 2012-08-02 MED ORDER — DIPHENHYDRAMINE HCL 50 MG/ML IJ SOLN: 12.5000 mg | Freq: Four times a day (QID) | INTRAMUSCULAR | Status: DC | PRN

## 2012-08-02 MED ORDER — IOHEXOL 300 MG/ML  SOLN
60.0000 mL | Freq: Once | INTRAMUSCULAR | Status: AC | PRN
  Administered 2012-08-02: 60 mL via INTRAVENOUS

## 2012-08-02 MED ORDER — DIPHENHYDRAMINE HCL 12.5 MG/5ML PO ELIX: 12.5000 mg | ORAL_SOLUTION | Freq: Four times a day (QID) | ORAL | Status: DC | PRN

## 2012-08-02 MED ORDER — ONDANSETRON HCL 4 MG/2ML IJ SOLN
4.0000 mg | Freq: Four times a day (QID) | INTRAMUSCULAR | Status: DC
  Administered 2012-08-02 (×2): 4 mg via INTRAVENOUS
  Filled 2012-08-02 (×3): qty 2

## 2012-08-02 MED ORDER — KCL IN DEXTROSE-NACL 20-5-0.45 MEQ/L-%-% IV SOLN
Freq: Once | INTRAVENOUS | Status: AC
  Administered 2012-08-02: 23:00:00 via INTRAVENOUS
  Filled 2012-08-02: qty 1000

## 2012-08-02 MED ORDER — ENOXAPARIN SODIUM 40 MG/0.4ML ~~LOC~~ SOLN
40.0000 mg | SUBCUTANEOUS | Status: DC
  Administered 2012-08-03 – 2012-08-04 (×2): 40 mg via SUBCUTANEOUS
  Filled 2012-08-02 (×2): qty 0.4

## 2012-08-02 MED ORDER — IOHEXOL 300 MG/ML  SOLN
20.0000 mL | INTRAMUSCULAR | Status: DC
  Administered 2012-08-02: 20 mL via ORAL

## 2012-08-02 MED ORDER — ACETAMINOPHEN 650 MG RE SUPP: 650.0000 mg | Freq: Four times a day (QID) | RECTAL | Status: DC | PRN

## 2012-08-02 MED ORDER — PANTOPRAZOLE SODIUM 40 MG IV SOLR
40.0000 mg | Freq: Every day | INTRAVENOUS | Status: DC
  Administered 2012-08-02 – 2012-08-03 (×2): 40 mg via INTRAVENOUS
  Filled 2012-08-02 (×3): qty 40

## 2012-08-02 MED ORDER — ACETAMINOPHEN 325 MG PO TABS: 650.0000 mg | ORAL_TABLET | Freq: Four times a day (QID) | ORAL | Status: DC | PRN

## 2012-08-02 MED ORDER — HYDROMORPHONE HCL PF 1 MG/ML IJ SOLN
1.0000 mg | INTRAMUSCULAR | Status: DC | PRN
Start: 2012-08-02 — End: 2012-08-04
  Administered 2012-08-02: 1 mg via INTRAVENOUS
  Filled 2012-08-02: qty 1

## 2012-08-02 MED ORDER — LACTATED RINGERS IV BOLUS (SEPSIS)
1000.0000 mL | Freq: Once | INTRAVENOUS | Status: AC
  Administered 2012-08-02: 1000 mL via INTRAVENOUS

## 2012-08-02 MED ORDER — ONDANSETRON 4 MG PO TBDP
4.0000 mg | ORAL_TABLET | Freq: Once | ORAL | Status: AC
  Administered 2012-08-02: 4 mg via ORAL
  Filled 2012-08-02: qty 1

## 2012-08-02 NOTE — ED Notes (Signed)
Pt presents with 2 day h/o abdominal pain.  Pt reports she is not passing any flatus, reports decreased amount of stool noted in colostomy.  Pt reports consistency and color of stool are normal.  +nausea and vomiting;  Pt reports passing mucus from rectum x 3 days ago x 4-5 days ago.

## 2012-08-02 NOTE — ED Provider Notes (Signed)
History   This chart was scribed for Hilario Quarry, MD, by Marcina Millard scribe. The patient was seen in room TR09C/TR09C and the patient's care was started at 1235.    CSN: [id number]  Arrival date & time 08/02/12  1143   First MD Initiated Contact with Patient 08/02/12 1235      Chief Complaint  Patient presents with  . Abdominal Pain    (Consider location/radiation/quality/duration/timing/severity/associated sxs/prior treatment) HPI Comments: Renee Nicholson is a 46 y.o. female who presents to the Emergency Department for a medical screening exam and is complaining of constant, moderate abdominal pain with decreased output from her colostomy bag that began last night. She has a h/o of colon cancer and completed radiation. She had a l colon resection on 12/14/11. She reports associated vomiting. She denies any fever or chills. She was sent from Dr. Arita Miss office to have labs and ct scan. Oncologist is Dr. Park Breed.    Patient is a 46 y.o. female presenting with abdominal pain. The history is provided by the patient.  Abdominal Pain The primary symptoms of the illness include abdominal pain and vomiting. The primary symptoms of the illness do not include fever. The current episode started 6 to 12 hours ago. The onset of the illness was sudden. The problem has been gradually worsening.  Vomiting occurs 2 to 5 times per day.  Associated with: a colostomy bag. The patient states that she believes she is currently not pregnant. The patient has had a change in bowel habit. Symptoms associated with the illness do not include chills. Associated symptoms comments: BM with mucus.. Associated medical issues comments: Colon cancer.    Past Medical History  Diagnosis Date  . Anemia   . Diarrhea   . Rectal cancer 08/16/2011  . Colon cancer   . Anxiety   . Bright red rectal bleeding 09/13/2011  . History of chemotherapy     completed 09/2011   . Hx of radiation therapy 08/29/11 to 10/09/11   rectum    Past Surgical History  Procedure Date  . Tubal ligation   . Carpal tunnel release   . Colonoscopy 07/26/2011    Procedure: COLONOSCOPY;  Surgeon: Arlyce Harman, MD;  [facility]: [facility];  Service: Endoscopy;  Laterality: N/A;  10:40  . Esophagogastroduodenoscopy 07/26/2011    Procedure: ESOPHAGOGASTRODUODENOSCOPY (EGD);  Surgeon: Arlyce Harman, MD;  Location: [facility];  Service: Endoscopy;  Laterality: N/A;  . Esophagogastroduodenoscopy 12/27/2011    Procedure: ESOPHAGOGASTRODUODENOSCOPY (EGD);  Surgeon: Shirley Friar, MD;  Location: [facility];  Service: Endoscopy;  Laterality: N/A;  . Laparoscopic colon resection 12/14/11    diverting ileostomy  . Portacath placement 02/21/2012    Procedure: INSERTION PORT-A-CATH;  Surgeon: [name], MD;  Location: [facility];  Service: General;  Laterality: N/A;    Family History  Problem Relation Age of Onset  . Diabetes Mother   . Colon cancer Neg Hx   . Cancer Brother 35    rectal cancer lives in [place]    History  Substance Use Topics  . Smoking status: Never Smoker   . Smokeless tobacco: Never Used  . Alcohol Use: No    OB History    Grav Para Term Preterm Abortions TAB SAB Ect Mult Living                  Review of Systems  Constitutional: Negative for fever and chills.  Gastrointestinal: Positive for vomiting and abdominal pain.  All other  systems reviewed and are negative.    Allergies  Review of patient's allergies indicates no known allergies.  Home Medications   Current Outpatient Rx  Name  Route  Sig  Dispense  Refill  . ACETAMINOPHEN 325 MG PO TABS   Oral   Take 2 tablets (650 mg total) by mouth every 4 (four) hours as needed.   200 tablet   1   . DEXAMETHASONE 4 MG PO TABS      Take 2 tablets by mouth two times a day starting the day after chemotherapy for 3 days. Take with food.   30 tablet   1   . DIPHENOXYLATE-ATROPINE 2.5-0.025 MG PO TABS   Oral   Take 1 tablet by mouth 4  (four) times daily as needed for diarrhea or loose stools.   60 tablet   5   . ENSURE COMPLETE PO LIQD   Oral   Take 237 mLs by mouth 3 (three) times daily between meals.   50 Bottle   6   . RESOURCE BREEZE PO LIQD   Oral   Take 1 Container by mouth 3 (three) times daily between meals.   30 Container   6   . IRON 28 MG PO TABS   Oral   Take 1 tablet by mouth daily.         Marland Kitchen HEPARIN LOCK FLUSH 100 UNIT/ML IV SOLN   Intracatheter   1 mL (100 Units total) by Intracatheter route as needed (250 units per lumen).   5 Syringe   3   . HYDROCODONE-ACETAMINOPHEN 5-500 MG PO TABS   Oral   Take 1-2 tablets by mouth every 6 (six) hours as needed for pain.   30 tablet   0   . LIDOCAINE-PRILOCAINE 2.5-2.5 % EX CREA   Topical   Apply topically as needed.   30 g   6   . LOPERAMIDE HCL 2 MG PO TABS      Take 2 tab at onset of diarrhea, then 1 tab q2h until 12 hr have passed without a BM.  May take 2 tab q4h qhs. If diarrhea recurs repeat.   60 tablet   4   . LOPERAMIDE HCL 2 MG PO CAPS   Oral   Take 2 capsules by mouth Every 4 hours as needed.         Marland Kitchen LORAZEPAM 0.5 MG PO TABS   Oral   Take 1 tablet (0.5 mg total) by mouth every 6 (six) hours as needed for anxiety.   30 tablet   0   . MEGESTROL ACETATE 400 MG/10ML PO SUSP   Oral   Take 10 mLs (400 mg total) by mouth 2 (two) times daily.   240 mL   1   . ONE-DAILY MULTI VITAMINS PO TABS   Oral   Take 1 tablet by mouth every morning.          Marland Kitchen ONDANSETRON HCL 8 MG PO TABS      Take 1 tablet two times a day starting the day after chemo for 3 days. Then take 1 tablet two times a day as needed for nausea or vomiting.   30 tablet   1   . PANTOPRAZOLE SODIUM 40 MG PO TBEC   Oral   Take 40 mg by mouth daily.         Marland Kitchen POLYSACCHARIDE IRON 150 MG PO CAPS   Oral   Take 150 mg by mouth every morning.          Marland Kitchen  POTASSIUM CHLORIDE CRYS ER 20 MEQ PO TBCR   Oral   Take 1 tablet (20 mEq total) by mouth  daily.   30 tablet   6   . PROCHLORPERAZINE MALEATE 10 MG PO TABS   Oral   Take 1 tablet (10 mg total) by mouth every 6 (six) hours as needed (Nausea or vomiting).   30 tablet   1   . PROCHLORPERAZINE 25 MG RE SUPP   Rectal   Place 1 suppository (25 mg total) rectally every 12 (twelve) hours as needed for nausea.   12 suppository   3   . SIMETHICONE 80 MG PO CHEW   Oral   Chew 80 mg by mouth every 6 (six) hours as needed. For gas           BP 120/72  Pulse 90  Temp 97.7 F (36.5 C) (Oral)  Resp 18  SpO2 100%  LMP 02/20/2012  Physical Exam  Nursing note and vitals reviewed. Constitutional: She is oriented to person, place, and time. She appears well-developed and well-nourished.  Eyes: Conjunctivae normal and EOM are normal. Pupils are equal, round, and reactive to light. Right eye exhibits no discharge. Left eye exhibits no discharge. No scleral icterus.  Neck: Normal range of motion.  Cardiovascular: Normal rate, regular rhythm and normal heart sounds.        Her chest has a porta cath, but is otherwise normal Shoddy submandib aden Chest has portacath, but is otherwise normal    Pulmonary/Chest: Effort normal and breath sounds normal.  Abdominal: Soft. Bowel sounds are normal. There is tenderness in the right upper quadrant, right lower quadrant and periumbilical area.  Neurological: She is alert and oriented to person, place, and time. A cranial nerve deficit is present.  Psychiatric: She has a normal mood and affect. Her behavior is normal.    ED Course  Procedures (including critical care time)  DIAGNOSTIC STUDIES: Oxygen Saturation is 100% on room air, normal by my interpretation.    COORDINATION OF CARE:  12:42-Discussed planned course of treatment with the patient, including filling the orders that Dr. Donell Beers placed for her, who is agreeable at this time.  12:44- Medication Orders- HYDROmorphone (DILAUDID) injection 1 mg  Every 2 once PRN, ondansetron  (ZOFRAN) injection 4 mg 4 times per day, lactated ringers bolus 1,000 ml-Once.  13:15- Medication Orders- iohexol (OMNIPAQUE) 300 mg/ml solution 20 ml every 1 hr x2.  14:45- Medication Orders- ondansetron (ZOFRAN-ODT) disintegrating tablet 4 mg- Once.      Labs Reviewed  CBC WITH DIFFERENTIAL  BASIC METABOLIC PANEL   No results found.   No diagnosis found. I personally performed the services described in this documentation, which was scribed in my presence. The recorded information has been reviewed and is accurate.     MDM  Patient moved to cdu and plan for care discussed with [name], PA-C.       Hilario Quarry, MD 08/03/12 0730

## 2012-08-02 NOTE — ED Notes (Signed)
Pt to CT scan.

## 2012-08-02 NOTE — Assessment & Plan Note (Signed)
Given the acuity of the patient's abdominal pain and its association with decreased ostomy output, I would like for her to get a CAT scan to evaluate for abscess or bowel obstruction.  I offered the patient admission to the hospital in order to give her IV fluids, pain medication, and anti-emetics and association with this scan.  She prefers to go to the emergency department to see if she needs to be admitted before getting admitted.  I am on [facility] call this weekend. I advised the patient to go there so if she needs to be admitted that way I can see her over the weekend.

## 2012-08-02 NOTE — Patient Instructions (Addendum)
Go to [facility] Emergency Room.    I will let the ED know you are coming.    I have discussed what to order with the charge nurse.

## 2012-08-02 NOTE — H&P (Signed)
Renee Nicholson is an 46 y.o. female.   Chief Complaint: N/V/Abd pain HPI:  Pt is 46 yo F s/p lap TAC and IPAA in may of 2013 for rectal cancer and attenuated FAP.  She has been doing well until recently.  She had foul drainage per rectum 2-3 days ago.  Since that time, she has felt poorly with some nausea and decreased appetite.  Last night, she had acute onset of more severe pain in the upper abdomen and vomiting.  She has had much less gas and stoma output in last 12 hours.  She was seen by me in clinic and sent to the ED for testing.   Past Medical History  Diagnosis Date  . Anemia   . Diarrhea   . Rectal cancer 08/16/2011  . Colon cancer   . Anxiety   . Bright red rectal bleeding 09/13/2011  . History of chemotherapy     completed 09/2011   . Hx of radiation therapy 08/29/11 to 10/09/11    rectum    Past Surgical History  Procedure Date  . Tubal ligation   . Carpal tunnel release   . Colonoscopy 07/26/2011    Procedure: COLONOSCOPY;  Surgeon: Arlyce Harman, MD;  [facility]: [facility];  Service: Endoscopy;  Laterality: N/A;  10:40  . Esophagogastroduodenoscopy 07/26/2011    Procedure: ESOPHAGOGASTRODUODENOSCOPY (EGD);  Surgeon: Arlyce Harman, MD;  Location: [facility];  Service: Endoscopy;  Laterality: N/A;  . Esophagogastroduodenoscopy 12/27/2011    Procedure: ESOPHAGOGASTRODUODENOSCOPY (EGD);  Surgeon: Shirley Friar, MD;  Location: [facility];  Service: Endoscopy;  Laterality: N/A;  . Laparoscopic colon resection 12/14/11    diverting ileostomy  . Portacath placement 02/21/2012    Procedure: INSERTION PORT-A-CATH;  Surgeon: [name], MD;  Location: [facility];  Service: General;  Laterality: N/A;    Family History  Problem Relation Age of Onset  . Diabetes Mother   . Colon cancer Neg Hx   . Cancer Brother 35    rectal cancer lives in [place]   Social History:  reports that she has never smoked. She has never used smokeless tobacco. She reports that she does not drink  alcohol or use illicit drugs.  Allergies: No Known Allergies   (Not in a hospital admission)  Results for orders placed during the hospital encounter of 08/02/12 (from the past 48 hour(s))  CBC WITH DIFFERENTIAL     Status: Abnormal   Collection Time   08/02/12 12:19 PM      Component Value Range Comment   WBC 24.0 (*) 4.0 - 10.5 K/uL    RBC 3.86 (*) 3.87 - 5.11 MIL/uL    Hemoglobin 12.1  12.0 - 15.0 g/dL    HCT 16.1 (*) 09.6 - 46.0 %    MCV 92.2  78.0 - 100.0 fL    MCH 31.3  26.0 - 34.0 pg    MCHC 34.0  30.0 - 36.0 g/dL    RDW 04.5 (*) 40.9 - 15.5 %    Platelets 203  150 - 400 K/uL    Neutrophils Relative 94 (*) 43 - 77 %    Neutro Abs 22.4 (*) 1.7 - 7.7 K/uL    Lymphocytes Relative 3 (*) 12 - 46 %    Lymphs Abs 0.7  0.7 - 4.0 K/uL    Monocytes Relative 3  3 - 12 %    Monocytes Absolute 0.8  0.1 - 1.0 K/uL    Eosinophils Relative 0  0 - 5 %  Eosinophils Absolute 0.0  0.0 - 0.7 K/uL    Basophils Relative 0  0 - 1 %    Basophils Absolute 0.0  0.0 - 0.1 K/uL   BASIC METABOLIC PANEL     Status: Abnormal   Collection Time   08/02/12 12:19 PM      Component Value Range Comment   Sodium 138  135 - 145 mEq/L    Potassium 3.8  3.5 - 5.1 mEq/L    Chloride 99  96 - 112 mEq/L    CO2 27  19 - 32 mEq/L    Glucose, Bld 127 (*) 70 - 99 mg/dL    BUN 7  6 - 23 mg/dL    Creatinine, Ser 2.95  0.50 - 1.10 mg/dL    Calcium 9.8  8.4 - 28.4 mg/dL    GFR calc non Af Amer >90  >90 mL/min    GFR calc Af Amer >90  >90 mL/min   COMPREHENSIVE METABOLIC PANEL     Status: Abnormal   Collection Time   08/02/12  3:27 PM      Component Value Range Comment   Sodium 135  135 - 145 mEq/L    Potassium 4.0  3.5 - 5.1 mEq/L    Chloride 96  96 - 112 mEq/L    CO2 28  19 - 32 mEq/L    Glucose, Bld 106 (*) 70 - 99 mg/dL    BUN 8  6 - 23 mg/dL    Creatinine, Ser 1.32  0.50 - 1.10 mg/dL    Calcium 9.3  8.4 - 44.0 mg/dL    Total Protein 6.8  6.0 - 8.3 g/dL    Albumin 3.8  3.5 - 5.2 g/dL    AST 19  0 - 37  U/L    ALT 13  0 - 35 U/L    Alkaline Phosphatase 148 (*) 39 - 117 U/L    Total Bilirubin 0.6  0.3 - 1.2 mg/dL    GFR calc non Af Amer >90  >90 mL/min    GFR calc Af Amer >90  >90 mL/min    Ct Abdomen Pelvis W Contrast  08/02/2012  *RADIOLOGY REPORT*  Clinical Data: Abdominal pain and vicinity of colostomy bag. History colon cancer with completed radiation 3 weeks ago.  Prior laparoscopic total proctocolectomy with ileal pouch - anal anastomosis and diverting ileostomy in March, 2013.  Vomiting.  CT ABDOMEN AND PELVIS WITH CONTRAST  Technique:  Multidetector CT imaging of the abdomen and pelvis was performed following the standard protocol during bolus administration of intravenous contrast.  Contrast:  60 ml Omnipaque-300  Comparison: Multiple exams, including 01/02/2012  Findings: The dominant finding on today's exam is distended stomach and distended abnormal loops of small bowel with one of the loops of distended small bowel demonstrating a transition point in the vicinity of swirled bowel at the ostomy site.  The appearance is compatible with obstruction, potentially due to twisting of the bowel in the vicinity of the ileostomy, or a parastomal hernia. The transition point is visible on image 30 of series 5.  Frothy material around the ostomy probably represents material within the ostomy bag rather than gas in the adjacent subcutaneous tissues.  Moderate to upper abdominal ascites noted.  This tracks in the pericolic gutters.  Ileal pouch noted in the pelvis anastomosing to the anal region.  Uterine contour unremarkable.  Urinary bladder unremarkable.  No hepatic metastatic lesion observed.  Spleen, adrenal glands, pancreas, and kidneys unremarkable.  No pathologic upper abdominal adenopathy observed.  There is some nodularity in the left lower quadrant omentum on images 50-56 of series 2.  Although this may simply be from fluid infiltration or vascularity, tumor is not completely excluded and  attention to this region on follow-up exams is recommended.  Persistent presacral soft tissue density is probably related to prior operative intervention and / or radiation therapy, without an obvious new mass observed.  There is sclerosis along the sacroiliac joints compatible with bilateral sacroiliitis.  IMPRESSION:  1.  Small bowel obstruction in the vicinity of the ostomy.  A transition point from dilated to collapse small bowel is present with the collapsed segment swirling around the vascular structures of the ostomy.  I am uncertain whether this represents the terminating loop of bowel or a second loop of bowel causing a parastomal hernia, although I favor the latter. 2.  Moderate ascites. 3.  There is some nodularity in the left lower quadrant omental tissue., possibly simply from fluid infiltration, tumor deposition in this vicinity is not totally excluded, and attention to this region on follow-up exams is recommended. 4.  Bilateral sacroiliitis.   Original Report Authenticated By: Gaylyn Rong, M.D.     Review of Systems  Constitutional: Positive for chills.  HENT: Negative.   Eyes: Negative.   Respiratory: Negative.   Cardiovascular: Negative.   Gastrointestinal: Positive for nausea, vomiting and abdominal pain.       Decreased gas and stool in ostomy  Genitourinary: Negative.   Musculoskeletal: Negative.   Skin: Negative.   Neurological: Positive for weakness.  Endo/Heme/Allergies: Negative.   Psychiatric/Behavioral: Negative.     Blood pressure 132/70, pulse 85, temperature 98.7 F (37.1 C), temperature source Oral, resp. rate 18, last menstrual period 02/20/2012, SpO2 100.00%. Physical Exam  Constitutional: She appears well-developed and well-nourished. She appears distressed.  HENT:  Head: Normocephalic and atraumatic.  Eyes: Conjunctivae normal are normal. Pupils are equal, round, and reactive to light. No scleral icterus.  Neck: Normal range of motion. Neck supple.    Cardiovascular: Normal rate, regular rhythm and intact distal pulses.   Respiratory: Effort normal. No respiratory distress.  GI: Soft. Distention: mildly distended. There is no tenderness. There is no rebound and no guarding.       Ostomy pink with small amount of gas and liquid stool   Musculoskeletal: Normal range of motion.  Neurological: She is alert. Coordination normal.  Skin: Skin is warm and dry. No rash noted. She is not diaphoretic. No erythema. No pallor.  Psychiatric: She has a normal mood and affect. Her behavior is normal. Judgment and thought content normal.     Assessment/Plan Partial small bowel obstruction  NPO  NGT  IVF  Check U/A.  Recheck labs in AM  Get xrays Sunday.   Protonix for GI prophylaxis.  Pain control and antiemetics.    Eugine Bubb 08/02/2012, 8:43 PM

## 2012-08-02 NOTE — ED Notes (Signed)
Paged IV team at this time for IV access

## 2012-08-02 NOTE — ED Provider Notes (Signed)
4:15pm Patient with a hx sig for rectal and colon cancer with colostomy was placed in CDU awaiting lab results by Hipolito Bayley, M.D.  Patient is here for 2 days of abdominal pain, not passing flatus and decreased amount of stool in the colostomy bag with associated nausea and vomiting and has received labs and imaging. Plan per previous provider is to obtain labs and imaging and then contact Dr. Donell Beers with [facility] for further management recommendations.   Patient re-evaluated and is resting comfortably, VSS, with no new complaints or concerns at this time.  On exam: hemodynamically stable, NAD, heart w/ RRR, lungs CTAB, mild abdominal tenderness in the right lower and right upper quadrants, no peripheral edema or calf tenderness.    7:13 PM CT with Small bowel obstruction in the vicinity of the ostomy.  I have discussed this with Dr Donell Beers.  We will initiate NG tube and D5 1/2 NS and proceed with admission.   Dahlia Client Omara Alcon, PA-C 08/02/12 2046

## 2012-08-02 NOTE — ED Notes (Signed)
Pt has porta cath and requests blood draw from there

## 2012-08-02 NOTE — Progress Notes (Signed)
HISTORY: Patient is a 46 year old female with a diagnosis of rectal cancer and numerous polyps in her colon. She underwent a laparoscopic total abdominal colectomy with ileal pouch anal anastomosis in May of this year. She complains of having very foul-smelling rectal discharge 2-3 days ago. At that time she has had some difficulty eating and having some decreased energy level. Last night she started having significant generalized abdominal pain and this worsened over the morning today. She also has not had to empty as much gas out of her ostomy bag is normal and she has had decreased stool from the ostomy. She denies fevers and chills. She has had trouble keeping things down especially in the last 12 hours in association with the pain. She feels like this is all gas pain, but none of her medications for gas have made her feeling better.   PERTINENT REVIEW OF SYSTEMS: Otherwise negative.     EXAM: Head: Normocephalic and atraumatic., very thin.   Eyes:  Conjunctivae are normal. Pupils are equal, round, and reactive to light. No scleral icterus.  Neck:  Normal range of motion. Neck supple. No tracheal deviation present. No thyromegaly present.  Resp: No respiratory distress, normal effort. Abd:  Abdomen is soft, mildly distended and mildly tender in the epigastrium. No masses are palpable.  There is no rebound and no guarding. Small amount of gas and liquid stool is present in bag. Neurological: Alert and oriented to person, place, and time. Coordination normal.  Skin: Skin is warm and dry. No rash noted. No diaphoretic. No erythema. No pallor.  Psychiatric: Normal mood and affect. Normal behavior. Judgment and thought content normal.      ASSESSMENT AND PLAN:   Abdominal pain, acute, generalized Given the acuity of the patient's abdominal pain and its association with decreased ostomy output, I would like for her to get a CAT scan to evaluate for abscess or bowel obstruction.  I offered the  patient admission to the hospital in order to give her IV fluids, pain medication, and anti-emetics and association with this scan.  She prefers to go to the emergency department to see if she needs to be admitted before getting admitted.  I am on [facility] call this weekend. I advised the patient to go there so if she needs to be admitted that way I can see her over the weekend.      Maudry Diego, MD Surgical Oncology, General & Endocrine Surgery [facility]  [name], MD [name], MD

## 2012-08-02 NOTE — ED Notes (Signed)
Pt here for further eval of abd pain with no gas from colostomy bag per norm and some mucus from rectum; pt sent here for eval and possible CT; pt with hx of colon CA

## 2012-08-03 ENCOUNTER — Inpatient Hospital Stay (HOSPITAL_COMMUNITY): Payer: BC Managed Care – PPO

## 2012-08-03 ENCOUNTER — Encounter (HOSPITAL_COMMUNITY): Payer: Self-pay | Admitting: *Deleted

## 2012-08-03 LAB — URINALYSIS, ROUTINE W REFLEX MICROSCOPIC
Bilirubin Urine: NEGATIVE
Leukocytes, UA: NEGATIVE
Nitrite: NEGATIVE
Specific Gravity, Urine: 1.014 (ref 1.005–1.030)
Urobilinogen, UA: 0.2 mg/dL (ref 0.0–1.0)
pH: 6 (ref 5.0–8.0)

## 2012-08-03 LAB — CBC
MCV: 91.4 fL (ref 78.0–100.0)
Platelets: 166 10*3/uL (ref 150–400)
RBC: 2.79 MIL/uL — ABNORMAL LOW (ref 3.87–5.11)
RDW: 16.4 % — ABNORMAL HIGH (ref 11.5–15.5)
WBC: 15.8 10*3/uL — ABNORMAL HIGH (ref 4.0–10.5)

## 2012-08-03 LAB — BASIC METABOLIC PANEL
Calcium: 8.4 mg/dL (ref 8.4–10.5)
Creatinine, Ser: 0.72 mg/dL (ref 0.50–1.10)
GFR calc Af Amer: >90 mL/min (ref >90)
GFR calc non Af Amer: >90 mL/min (ref >90)
Sodium: 140 mEq/L (ref 135–145)

## 2012-08-03 LAB — URINE MICROSCOPIC-ADD ON

## 2012-08-03 LAB — PHOSPHORUS: Phosphorus: 3.9 mg/dL (ref 2.3–4.6)

## 2012-08-03 LAB — MAGNESIUM: Magnesium: 1.2 mg/dL — ABNORMAL LOW (ref 1.5–2.5)

## 2012-08-03 MED ORDER — KCL IN DEXTROSE-NACL 20-5-0.45 MEQ/L-%-% IV SOLN
INTRAVENOUS | Status: DC
  Administered 2012-08-03 – 2012-08-04 (×3): via INTRAVENOUS
  Filled 2012-08-03 (×5): qty 1000

## 2012-08-03 MED ORDER — BIOTENE DRY MOUTH MT LIQD
15.0000 mL | Freq: Two times a day (BID) | OROMUCOSAL | Status: DC
  Administered 2012-08-03 (×2): 15 mL via OROMUCOSAL

## 2012-08-03 MED ORDER — CHLORHEXIDINE GLUCONATE 0.12 % MT SOLN
15.0000 mL | Freq: Two times a day (BID) | OROMUCOSAL | Status: DC
  Administered 2012-08-03 – 2012-08-04 (×3): 15 mL via OROMUCOSAL
  Filled 2012-08-03 (×2): qty 15

## 2012-08-03 MED ORDER — ONDANSETRON HCL 4 MG/2ML IJ SOLN
4.0000 mg | Freq: Four times a day (QID) | INTRAMUSCULAR | Status: DC | PRN
Start: 2012-08-03 — End: 2012-08-04

## 2012-08-03 MED ORDER — SODIUM CHLORIDE 0.9 % IV BOLUS (SEPSIS)
1000.0000 mL | Freq: Once | INTRAVENOUS | Status: AC
  Administered 2012-08-03: 1000 mL via INTRAVENOUS

## 2012-08-03 MED ORDER — PROMETHAZINE HCL 25 MG/ML IJ SOLN
12.5000 mg | Freq: Four times a day (QID) | INTRAMUSCULAR | Status: DC | PRN
  Administered 2012-08-03: 6.25 mg via INTRAVENOUS
  Filled 2012-08-03: qty 1

## 2012-08-03 MED ORDER — SODIUM CHLORIDE 0.9 % IJ SOLN
10.0000 mL | INTRAMUSCULAR | Status: DC | PRN
  Administered 2012-08-03 – 2012-08-04 (×2): 10 mL

## 2012-08-03 MED ORDER — BOOST / RESOURCE BREEZE PO LIQD
1.0000 | Freq: Every day | ORAL | Status: DC
  Administered 2012-08-03: 1 via ORAL

## 2012-08-03 NOTE — Progress Notes (Signed)
Subjective: Had some dry heaves last night, but less nausea on phenergan.  Has also had significant output from ostomy.  Since the output occurred, she is no longer having pain.    Objective: Vital signs in last 24 hours: Temp:  [97.6 F (36.4 C)-98.7 F (37.1 C)] 97.6 F (36.4 C) (11/09 0605) Pulse Rate:  [57-100] 57  (11/09 0605) Resp:  [16-18] 17  (11/09 0605) BP: (88-132)/(42-82) 98/50 mmHg (11/09 0605) SpO2:  [92 %-100 %] 100 % (11/09 0605) Weight:  [78 lb 11.3 oz (35.7 kg)-78 lb 12.8 oz (35.743 kg)] 78 lb 11.3 oz (35.7 kg) (11/08 2333) Last BM Date: 08/02/12  Intake/Output from previous day: 11/08 0701 - 11/09 0700 In: 0  Out: 350 [Urine:350] Intake/Output this shift: Total I/O In: 0  Out: 350 [Urine:350]  General appearance: alert, cooperative and very thin Resp: no distress GI: soft, nondistended.  liquid stool, gas in ostomy bag.  non tender Extremities: extremities normal, atraumatic, no cyanosis or edema  Lab Results:   [facility] 08/03/12 0546 08/02/12 1219  WBC 15.8* 24.0*  HGB 8.7* 12.1  HCT 25.5* 35.6*  PLT 166 203   BMET  Basename 08/02/12 1527 08/02/12 1219  NA 135 138  K 4.0 3.8  CL 96 99  CO2 28 27  GLUCOSE 106* 127*  BUN 8 7  CREATININE 0.63 0.63  CALCIUM 9.3 9.8   PT/INR No results found for this basename: LABPROT:2,INR:2 in the last 72 hours ABG No results found for this basename: PHART:2,PCO2:2,PO2:2,HCO3:2 in the last 72 hours  Studies/Results: Ct Abdomen Pelvis W Contrast  08/02/2012  *RADIOLOGY REPORT*  Clinical Data: Abdominal pain and vicinity of colostomy bag. History colon cancer with completed radiation 3 weeks ago.  Prior laparoscopic total proctocolectomy with ileal pouch - anal anastomosis and diverting ileostomy in March, 2013.  Vomiting.  CT ABDOMEN AND PELVIS WITH CONTRAST  Technique:  Multidetector CT imaging of the abdomen and pelvis was performed following the standard protocol during bolus administration of  intravenous contrast.  Contrast:  60 ml Omnipaque-300  Comparison: Multiple exams, including 01/02/2012  Findings: The dominant finding on today's exam is distended stomach and distended abnormal loops of small bowel with one of the loops of distended small bowel demonstrating a transition point in the vicinity of swirled bowel at the ostomy site.  The appearance is compatible with obstruction, potentially due to twisting of the bowel in the vicinity of the ileostomy, or a parastomal hernia. The transition point is visible on image 30 of series 5.  Frothy material around the ostomy probably represents material within the ostomy bag rather than gas in the adjacent subcutaneous tissues.  Moderate to upper abdominal ascites noted.  This tracks in the pericolic gutters.  Ileal pouch noted in the pelvis anastomosing to the anal region.  Uterine contour unremarkable.  Urinary bladder unremarkable.  No hepatic metastatic lesion observed.  Spleen, adrenal glands, pancreas, and kidneys unremarkable.  No pathologic upper abdominal adenopathy observed.  There is some nodularity in the left lower quadrant omentum on images 50-56 of series 2.  Although this may simply be from fluid infiltration or vascularity, tumor is not completely excluded and attention to this region on follow-up exams is recommended.  Persistent presacral soft tissue density is probably related to prior operative intervention and / or radiation therapy, without an obvious new mass observed.  There is sclerosis along the sacroiliac joints compatible with bilateral sacroiliitis.  IMPRESSION:  1.  Small bowel obstruction in the vicinity  of the ostomy.  A transition point from dilated to collapse small bowel is present with the collapsed segment swirling around the vascular structures of the ostomy.  I am uncertain whether this represents the terminating loop of bowel or a second loop of bowel causing a parastomal hernia, although I favor the latter. 2.   Moderate ascites. 3.  There is some nodularity in the left lower quadrant omental tissue., possibly simply from fluid infiltration, tumor deposition in this vicinity is not totally excluded, and attention to this region on follow-up exams is recommended. 4.  Bilateral sacroiliitis.   Original Report Authenticated By: Gaylyn Rong, M.D.     Anti-infectives: Anti-infectives    None      Assessment/Plan: s/p * No surgery found * plain films. Pt refused NGT. Clears. IVF- appeared very dehydrated as HCT went from 35 to 25 with hydration. Hopeful that SBO resolving given output in ostomy.    LOS: 1 day    [facility] 08/03/2012

## 2012-08-03 NOTE — Progress Notes (Signed)
INITIAL ADULT NUTRITION ASSESSMENT Date: 08/03/2012   Time: 12:51 PM  INTERVENTION:  [employer] supplement daily ---> 250 kcals, 9 gm protein per 8 fl oz carton) RD to follow for nutrition care plan  DOCUMENTATION CODES Per approved criteria  -Severe malnutrition in the context of chronic illness -Underweight   Reason for Assessment: Malnutrition Screening Tool Report  ASSESSMENT: Female 46 y.o.  Dx: N/V, abdominal pain  Hx:  Past Medical History  Diagnosis Date  . Anemia   . Diarrhea   . Rectal cancer 08/16/2011  . Colon cancer   . Anxiety   . Bright red rectal bleeding 09/13/2011  . History of chemotherapy     completed 09/2011   . Hx of radiation therapy 08/29/11 to 10/09/11    rectum    Related Meds:     . antiseptic oral rinse  15 mL Mouth Rinse q12n4p  . chlorhexidine  15 mL Mouth Rinse BID  . [COMPLETED] dextrose 5 % and 0.45 % NaCl with KCl 20 mEq/L   Intravenous Once  . enoxaparin  40 mg Subcutaneous Q24H  . [COMPLETED] lactated ringers  1,000 mL Intravenous Once  . [COMPLETED] ondansetron  4 mg Oral Once  . pantoprazole (PROTONIX) IV  40 mg Intravenous QHS  . [COMPLETED] sodium chloride  1,000 mL Intravenous Once  . [DISCONTINUED] iohexol  20 mL Oral Q1 Hr x 2  . [DISCONTINUED] ondansetron (ZOFRAN) IV  4 mg Intravenous Q6H    Ht: 4\' 9"  (144.8 cm)  Wt: 78 lb 11.3 oz (35.7 kg)  Wt Readings from Last 20 Encounters:  08/02/12 78 lb 11.3 oz (35.7 kg)  08/02/12 78 lb 12.8 oz (35.743 kg)  07/23/12 80 lb 8 oz (36.515 kg)  07/16/12 79 lb 6.4 oz (36.016 kg)  07/09/12 79 lb 14.4 oz (36.242 kg)  07/02/12 79 lb 1.6 oz (35.88 kg)  06/27/12 78 lb 9.6 oz (35.653 kg)  06/25/12 80 lb 3.2 oz (36.378 kg)  06/18/12 82 lb 1.6 oz (37.24 kg)  06/14/12 82 lb (37.195 kg)  06/11/12 81 lb 4.8 oz (36.877 kg)  06/04/12 83 lb 11.2 oz (37.966 kg)  05/23/12 82 lb 11.2 oz (37.512 kg)  05/14/12 82 lb 9.6 oz (37.467 kg)  05/06/12 82 lb 12.8 oz (37.558 kg)  04/30/12  83 lb 6.4 oz (37.83 kg)  04/23/12 83 lb 1.6 oz (37.694 kg)  04/16/12 83 lb 6.4 oz (37.83 kg)  04/09/12 85 lb 1.6 oz (38.601 kg)  04/01/12 85 lb 4.8 oz (38.692 kg)    Ideal Wt: 43.1 kg/m2 % Ideal Wt: 83%  Usual Wt: 85 lb % Usual Wt: 92%  Body mass index is 17.03 kg/(m^2).  Food/Nutrition Related Hx: recent weight lost without trying and decreased appetite per admission nutrition screen  Labs:  CMP     Component Value Date/Time   NA 140 08/03/2012 0546   NA 141 07/23/2012 0912   K 3.6 08/03/2012 0546   K 3.3* 07/23/2012 0912   CL 104 08/03/2012 0546   CL 105 07/23/2012 0912   CO2 27 08/03/2012 0546   CO2 27 07/23/2012 0912   GLUCOSE 119* 08/03/2012 0546   GLUCOSE 104* 07/23/2012 0912   BUN 7 08/03/2012 0546   BUN 8.0 07/23/2012 0912   CREATININE 0.72 08/03/2012 0546   CREATININE 0.7 07/23/2012 0912   CREATININE 0.70 07/27/2011 1306   CALCIUM 8.4 08/03/2012 0546   CALCIUM 9.3 07/23/2012 0912   PROT 6.8 08/02/2012 1527   PROT 6.0* 07/23/2012  0912   ALBUMIN 3.8 08/02/2012 1527   ALBUMIN 3.7 07/23/2012 0912   AST 19 08/02/2012 1527   AST 15 07/23/2012 0912   ALT 13 08/02/2012 1527   ALT 19 07/23/2012 0912   ALKPHOS 148* 08/02/2012 1527   ALKPHOS 172* 07/23/2012 0912   BILITOT 0.6 08/02/2012 1527   BILITOT 0.74 07/23/2012 0912   GFRNONAA >90 08/03/2012 0546   GFRAA >90 08/03/2012 0546     Intake/Output Summary (Last 24 hours) at 08/03/12 1252 Last data filed at 08/03/12 1026  Gross per 24 hour  Intake    240 ml  Output    350 ml  Net   -110 ml    Diet Order: Clear Liquid  Supplements/Tube Feeding: N/A  IVF:    dextrose 5 % and 0.45 % NaCl with KCl 20 mEq/L Last Rate: 100 mL/hr at 08/03/12 1027    Estimated Nutritional Needs:   Kcal: 1200-1400 Protein: 55-65 gm Fluid: > 1.5 L  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  Patient meets criteria for severe malnutrition in the context of chronic illness given 8% weight loss x 4 months, severe muscle loss.  NUTRITION DIAGNOSIS: -Unintended Weight Loss  RELATED TO: catabolic illness  AS EVIDENCE BY: 8% weight loss  MONITORING/EVALUATION(Goals): Goal: Oral intake with meals & supplements to meet >/= 90% of estimated nutrition needs Monitor: PO & supplemental intake, weight, labs, I/O's  EDUCATION NEEDS: -No education needs identified at this time  Kirkland Hun, RD, LDN Pager #: [phone] After-Hours Pager #: [phone]

## 2012-08-03 NOTE — ED Provider Notes (Signed)
  I performed a history and physical examination of Renee Nicholson and discussed her management with Ms Muthersbaugh.  I agree with the history, physical, assessment, and plan of care, with the following exceptions: None  I was present for the following procedures: None Time Spent in Critical Care of the patient: None Time spent in discussions with the patient and family: [address]   Hilario Quarry, MD 08/03/12 [phone]

## 2012-08-03 NOTE — Progress Notes (Signed)
0030 Dr. Donell Beers notified to patient continue to be nauseated after zofran given and that patient continues to refuse the ngt. Order received for prn phenergan. Will continue to monitor

## 2012-08-04 LAB — BASIC METABOLIC PANEL
CO2: 28 mEq/L (ref 19–32)
Calcium: 9 mg/dL (ref 8.4–10.5)
Creatinine, Ser: 0.59 mg/dL (ref 0.50–1.10)
GFR calc non Af Amer: >90 mL/min (ref >90)
Glucose, Bld: 113 mg/dL — ABNORMAL HIGH (ref 70–99)

## 2012-08-04 LAB — CBC
MCH: 30.5 pg (ref 26.0–34.0)
MCHC: 33.3 g/dL (ref 30.0–36.0)
MCV: 91.6 fL (ref 78.0–100.0)
Platelets: 175 10*3/uL (ref 150–400)
RBC: 3.21 MIL/uL — ABNORMAL LOW (ref 3.87–5.11)

## 2012-08-04 MED ORDER — ENOXAPARIN SODIUM 30 MG/0.3ML ~~LOC~~ SOLN
30.0000 mg | Freq: Every day | SUBCUTANEOUS | Status: DC
  Filled 2012-08-04: qty 0.3

## 2012-08-04 MED ORDER — PANTOPRAZOLE SODIUM 40 MG PO TBEC
40.0000 mg | DELAYED_RELEASE_TABLET | Freq: Every day | ORAL | Status: DC
  Administered 2012-08-04: 40 mg via ORAL
  Filled 2012-08-04: qty 1

## 2012-08-04 MED ORDER — ENOXAPARIN SODIUM 30 MG/0.3ML ~~LOC~~ SOLN
18.0000 mg | Freq: Every day | SUBCUTANEOUS | Status: DC
  Filled 2012-08-04: qty 0.18

## 2012-08-04 NOTE — Progress Notes (Signed)
Will d/c home if tolerating reg diet.    Agree with above.

## 2012-08-04 NOTE — Progress Notes (Signed)
Patient ID: Renee Nicholson, female   DOB: Nov 28, 1965, 46 y.o.   MRN: [id number]    Subjective: Pt doing well today.  Tolerated clear liquids without any issues yesterday.  Wants more to eat today.    Objective: Vital signs in last 24 hours: Temp:  [97.3 F (36.3 C)-98.5 F (36.9 C)] 97.6 F (36.4 C) (11/10 0621) Pulse Rate:  [58-65] 59  (11/10 0621) Resp:  [16-17] 17  (11/10 0621) BP: (99-118)/(53-71) 116/60 mmHg (11/10 0621) SpO2:  [99 %-100 %] 100 % (11/10 0621) Last BM Date: 08/03/12  Intake/Output from previous day: 11/09 0701 - 11/10 0700 In: 1520 [P.O.:720; I.V.:800] Out: 1150 [Stool:1150] Intake/Output this shift:    PE: Abd: soft, bilious output and gas in ostomy, ND, NT  Lab Results:   [facility] 08/04/12 0540 08/03/12 0546  WBC 5.4 15.8*  HGB 9.8* 8.7*  HCT 29.4* 25.5*  PLT 175 166   BMET  Basename 08/04/12 0540 08/03/12 0546  NA 141 140  K 3.4* 3.6  CL 103 104  CO2 28 27  GLUCOSE 113* 119*  BUN 2* 7  CREATININE 0.59 0.72  CALCIUM 9.0 8.4   PT/INR No results found for this basename: LABPROT:2,INR:2 in the last 72 hours CMP     Component Value Date/Time   NA 141 08/04/2012 0540   NA 141 07/23/2012 0912   K 3.4* 08/04/2012 0540   K 3.3* 07/23/2012 0912   CL 103 08/04/2012 0540   CL 105 07/23/2012 0912   CO2 28 08/04/2012 0540   CO2 27 07/23/2012 0912   GLUCOSE 113* 08/04/2012 0540   GLUCOSE 104* 07/23/2012 0912   BUN 2* 08/04/2012 0540   BUN 8.0 07/23/2012 0912   CREATININE 0.59 08/04/2012 0540   CREATININE 0.7 07/23/2012 0912   CREATININE 0.70 07/27/2011 1306   CALCIUM 9.0 08/04/2012 0540   CALCIUM 9.3 07/23/2012 0912   PROT 6.8 08/02/2012 1527   PROT 6.0* 07/23/2012 0912   ALBUMIN 3.8 08/02/2012 1527   ALBUMIN 3.7 07/23/2012 0912   AST 19 08/02/2012 1527   AST 15 07/23/2012 0912   ALT 13 08/02/2012 1527   ALT 19 07/23/2012 0912   ALKPHOS 148* 08/02/2012 1527   ALKPHOS 172* 07/23/2012 0912   BILITOT 0.6 08/02/2012 1527   BILITOT 0.74  07/23/2012 0912   GFRNONAA >90 08/04/2012 0540   GFRAA >90 08/04/2012 0540   Lipase  No results found for this basename: lipase       Studies/Results: Ct Abdomen Pelvis W Contrast  08/02/2012  *RADIOLOGY REPORT*  Clinical Data: Abdominal pain and vicinity of colostomy bag. History colon cancer with completed radiation 3 weeks ago.  Prior laparoscopic total proctocolectomy with ileal pouch - anal anastomosis and diverting ileostomy in March, 2013.  Vomiting.  CT ABDOMEN AND PELVIS WITH CONTRAST  Technique:  Multidetector CT imaging of the abdomen and pelvis was performed following the standard protocol during bolus administration of intravenous contrast.  Contrast:  60 ml Omnipaque-300  Comparison: Multiple exams, including 01/02/2012  Findings: The dominant finding on today's exam is distended stomach and distended abnormal loops of small bowel with one of the loops of distended small bowel demonstrating a transition point in the vicinity of swirled bowel at the ostomy site.  The appearance is compatible with obstruction, potentially due to twisting of the bowel in the vicinity of the ileostomy, or a parastomal hernia. The transition point is visible on image 30 of series 5.  Frothy material around the ostomy probably represents material  within the ostomy bag rather than gas in the adjacent subcutaneous tissues.  Moderate to upper abdominal ascites noted.  This tracks in the pericolic gutters.  Ileal pouch noted in the pelvis anastomosing to the anal region.  Uterine contour unremarkable.  Urinary bladder unremarkable.  No hepatic metastatic lesion observed.  Spleen, adrenal glands, pancreas, and kidneys unremarkable.  No pathologic upper abdominal adenopathy observed.  There is some nodularity in the left lower quadrant omentum on images 50-56 of series 2.  Although this may simply be from fluid infiltration or vascularity, tumor is not completely excluded and attention to this region on follow-up  exams is recommended.  Persistent presacral soft tissue density is probably related to prior operative intervention and / or radiation therapy, without an obvious new mass observed.  There is sclerosis along the sacroiliac joints compatible with bilateral sacroiliitis.  IMPRESSION:  1.  Small bowel obstruction in the vicinity of the ostomy.  A transition point from dilated to collapse small bowel is present with the collapsed segment swirling around the vascular structures of the ostomy.  I am uncertain whether this represents the terminating loop of bowel or a second loop of bowel causing a parastomal hernia, although I favor the latter. 2.  Moderate ascites. 3.  There is some nodularity in the left lower quadrant omental tissue., possibly simply from fluid infiltration, tumor deposition in this vicinity is not totally excluded, and attention to this region on follow-up exams is recommended. 4.  Bilateral sacroiliitis.   Original Report Authenticated By: Gaylyn Rong, M.D.    Dg Abd 2 Views  08/03/2012  *RADIOLOGY REPORT*  Clinical Data:  bowel distention  ABDOMEN - 2 VIEW  Comparison: CT scan 08/02/2012  Findings: No free abdominal air is noted.  Gaseous distended small bowel loops left abdomen suspicious for partial small bowel obstruction.  Stable postsurgical changes midline pelvis.  IMPRESSION: Gaseous distended small bowel loops left abdomen suspicious for partial small bowel obstruction.   Original Report Authenticated By: Natasha Mead, M.D.     Anti-infectives: Anti-infectives    None       Assessment/Plan  1. SBO, resolving Patient Active Problem List  Diagnosis  . 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 Thyroid mass  . High output ileostomy  . Hx of radiation therapy  . Abdominal pain, acute, generalized   Plan: 1. Advance to a regular diet today and see how she does.  ? Home later today or tomorrow per Dr. Donell Beers   LOS:  2 days    Azaan Leask E 08/04/2012, 8:35 AM Pager: [phone]

## 2012-08-12 NOTE — Discharge Summary (Signed)
Physician Discharge Summary  Patient ID: Renee Nicholson MRN: [id number] DOB/AGE: [birth year] 46 y.o.  Admit date: 08/02/2012 Discharge date: 08/12/2012  Admission Diagnoses: Small bowel obstruction  Discharge Diagnoses:  Same  Discharged Condition: stable  Hospital Course:  Pt admitted from the ED with SBO based on symptoms and CT findings.  She was having n/v/abdominal pain in clinic and was in distress.  She did not have any gas or liquid ostomy output for several days.  She did not feel better after hydration and pain medication in ED.  Also, CT revealed partial obstruction.  She was admitted to the floor.  She refused NGT, but started having output within 18 hours of admission.  She was able to advance on her diet.  She was no longer having crampy pain.  She was discharged to home in stable condition.    Consults: None  Significant Diagnostic Studies: radiology: CT scan: SBO  Treatments: IV hydration and electrolyte replacement.    Discharge Exam: Blood pressure 116/60, pulse 59, temperature 97.6 F (36.4 C), temperature source Oral, resp. rate 17, height 4\' 9"  (1.448 m), weight 78 lb 11.3 oz (35.7 kg), last menstrual period 02/20/2012, SpO2 100.00%. General appearance: alert, cooperative and no distress Chest wall: no tenderness GI: soft, non distended, non tender.  flatus and pasty stool in bag Extremities: extremities normal, atraumatic, no cyanosis or edema  Disposition: 01-Home or Self Care  Discharge Orders    Future Appointments: Provider: Department: Dept Phone: Center:   08/16/2012 10:45 AM [name], MD [facility] [phone] None   08/21/2012 10:30 AM Windell [facility] MEDICAL ONCOLOGY [phone] None   08/21/2012 11:00 AM Victorino December, MD [facility] MEDICAL ONCOLOGY [phone] None       Medication List     As of 08/12/2012 11:11 PM    TAKE these medications         acetaminophen 325 MG  tablet   Commonly known as: TYLENOL   Take 650 mg by mouth every 6 (six) hours as needed. Headache/pain      dexamethasone 4 MG tablet   Commonly known as: DECADRON   Take 2 tablets by mouth two times a day starting the day after chemotherapy for 3 days. Take with food.      diphenoxylate-atropine 2.5-0.025 MG per tablet   Commonly known as: LOMOTIL   Take 1 tablet by mouth 4 (four) times daily as needed for diarrhea or loose stools.      feeding supplement Liqd   Take 237 mLs by mouth 3 (three) times daily between meals.      HYDROcodone-acetaminophen 5-500 MG per tablet   Commonly known as: VICODIN   Take 1-2 tablets by mouth every 6 (six) hours as needed for pain.      hydroxypropyl methylcellulose 2.5 % ophthalmic solution   Commonly known as: ISOPTO TEARS   Place 1 drop into both eyes 4 (four) times daily as needed. Dry eyes      Iron 28 MG Tabs   Take 1 tablet by mouth daily.      loperamide 2 MG capsule   Commonly known as: IMODIUM   Take 2 capsules by mouth Every 4 hours as needed. For diarrhea      LORazepam 0.5 MG tablet   Commonly known as: ATIVAN   Take 1 tablet (0.5 mg total) by mouth every 6 (six) hours as needed for anxiety.      megestrol 400  MG/10ML suspension   Commonly known as: MEGACE   Take 10 mLs (400 mg total) by mouth 2 (two) times daily.      multivitamin tablet   Take 1 tablet by mouth every morning.      ondansetron 8 MG tablet   Commonly known as: ZOFRAN   Take 1 tablet two times a day starting the day after chemo for 3 days. Then take 1 tablet two times a day as needed for nausea or vomiting.      pantoprazole 40 MG tablet   Commonly known as: PROTONIX   Take 40 mg by mouth daily.      polysaccharide iron 150 MG capsule   Generic drug: iron polysaccharides   Take 150 mg by mouth every morning.      potassium chloride SA 20 MEQ tablet   Commonly known as: K-DUR,KLOR-CON   Take 1 tablet (20 mEq total) by mouth daily.       prochlorperazine 10 MG tablet   Commonly known as: COMPAZINE   Take 1 tablet (10 mg total) by mouth every 6 (six) hours as needed (Nausea or vomiting).      prochlorperazine 25 MG suppository   Commonly known as: COMPAZINE   Place 1 suppository (25 mg total) rectally every 12 (twelve) hours as needed for nausea.      simethicone 80 MG chewable tablet   Commonly known as: MYLICON   Chew 80 mg by mouth every 6 (six) hours as needed. For gas      VITAMIN B-12 ER PO   Take 1 tablet by mouth daily.           Follow-up Information    Follow up with [facility], [facility]. Schedule an appointment as soon as possible for a visit in 2 weeks.   Contact information:   [address] [place] [address] [phone]          Signed: [name] 08/12/2012, 11:11 PM

## 2012-08-16 ENCOUNTER — Ambulatory Visit (INDEPENDENT_AMBULATORY_CARE_PROVIDER_SITE_OTHER): Payer: BC Managed Care – PPO | Admitting: General Surgery

## 2012-08-16 ENCOUNTER — Encounter (INDEPENDENT_AMBULATORY_CARE_PROVIDER_SITE_OTHER): Payer: Self-pay | Admitting: General Surgery

## 2012-08-16 VITALS — BP 118/60 | HR 62 | Temp 98.0°F | Resp 18 | Ht <= 58 in | Wt 79.6 lb

## 2012-08-16 DIAGNOSIS — C20 Malignant neoplasm of rectum: Secondary | ICD-10-CM

## 2012-08-16 NOTE — Progress Notes (Signed)
HISTORY: Patient's 46 year old female status post laparoscopic proctocolectomy with ileal pouch anal anastomosis for attenuated FAP and rectal cancer.  She was admitted around 3 weeks ago for a bowel obstruction. This resolved very quickly after being n.p.o. And receiving a CT scan.  She was able to advance her diet fairly quickly and was discharged within 3-4 days. She has been doing better since she has a home. She has not had recurrent abdominal pain, nausea, and vomiting. She is afebrile. She is cooking and driving and doing her other activities of daily living.   PERTINENT REVIEW OF SYSTEMS: Otherwise negative.     EXAM: Head: Normocephalic and atraumatic, still very thin.  No distress.    Eyes:  Conjunctivae are normal. Pupils are equal, round, and reactive to light. No scleral icterus.  Resp: No respiratory distress, normal effort. Abd:  Abdomen is soft, non distended and non tender. No masses are palpable.  There is no rebound and no guarding. Ileostomy output watery.   Neurological: Alert and oriented to person, place, and time. Coordination normal.  Skin: Skin is warm and dry. No rash noted. No diaphoretic. No erythema. No pallor.  Psychiatric: Normal mood and affect. Normal behavior. Judgment and thought content normal.    ASSESSMENT AND PLAN:   Rectal cancer s/p proctocolectomy, j-pouch with ileoanal anastomosis, loop ileostomy on 12/14/11 Patient is doing much better since she was admitted for a bowel obstruction. She has no clinical evidence of disease. She is still weak and a little frail. I will see her back in January. I have advised her to gain weight and get stronger. In January what we will initiate testing to figure out if we can take her ileostomy down.  She will need a rectal exam to evaluate muscle tone, a pouchogram and scans.    Maudry Diego, MD Surgical Oncology, General & Endocrine Surgery [facility]  [name], MD [name],  MD

## 2012-08-16 NOTE — Assessment & Plan Note (Addendum)
Patient is doing much better since she was admitted for a bowel obstruction. She has no clinical evidence of disease. She is still weak and a little frail. I will see her back in January. I have advised her to gain weight and get stronger. In January what we will initiate testing to figure out if we can take her ileostomy down.  She will need a rectal exam to evaluate muscle tone, a pouchogram and scans.

## 2012-08-16 NOTE — Patient Instructions (Addendum)
Follow up in late January.  Exercise  Gain some weight.  Do Kegel exercises (tightening pelvic muscles)

## 2012-08-19 ENCOUNTER — Encounter (INDEPENDENT_AMBULATORY_CARE_PROVIDER_SITE_OTHER): Payer: BC Managed Care – PPO | Admitting: General Surgery

## 2012-08-19 ENCOUNTER — Telehealth: Payer: Self-pay | Admitting: Oncology

## 2012-08-19 NOTE — Telephone Encounter (Signed)
S/w the pt and she is aware of her dec appts °

## 2012-08-21 ENCOUNTER — Other Ambulatory Visit: Payer: BC Managed Care – PPO | Admitting: Lab

## 2012-08-21 ENCOUNTER — Ambulatory Visit: Payer: BC Managed Care – PPO | Admitting: Oncology

## 2012-08-27 ENCOUNTER — Telehealth: Payer: Self-pay | Admitting: Oncology

## 2012-08-27 ENCOUNTER — Encounter: Payer: Self-pay | Admitting: Oncology

## 2012-08-27 ENCOUNTER — Other Ambulatory Visit (HOSPITAL_BASED_OUTPATIENT_CLINIC_OR_DEPARTMENT_OTHER): Payer: BC Managed Care – PPO | Admitting: Lab

## 2012-08-27 ENCOUNTER — Ambulatory Visit (HOSPITAL_BASED_OUTPATIENT_CLINIC_OR_DEPARTMENT_OTHER): Payer: BC Managed Care – PPO | Admitting: Oncology

## 2012-08-27 VITALS — BP 111/73 | HR 70 | Temp 97.6°F | Resp 20 | Ht <= 58 in | Wt 79.4 lb

## 2012-08-27 DIAGNOSIS — C20 Malignant neoplasm of rectum: Secondary | ICD-10-CM

## 2012-08-27 LAB — CBC WITH DIFFERENTIAL/PLATELET
BASO%: 0.5 % (ref 0.0–2.0)
EOS%: 9.8 % — ABNORMAL HIGH (ref 0.0–7.0)
HGB: 11.5 g/dL — ABNORMAL LOW (ref 11.6–15.9)
MCH: 31.9 pg (ref 25.1–34.0)
MCHC: 33.8 g/dL (ref 31.5–36.0)
RBC: 3.6 10*6/uL — ABNORMAL LOW (ref 3.70–5.45)
RDW: 14.3 % (ref 11.2–14.5)
lymph#: 0.7 10*3/uL — ABNORMAL LOW (ref 0.9–3.3)

## 2012-08-27 LAB — COMPREHENSIVE METABOLIC PANEL (CC13)
ALT: 16 U/L (ref 0–55)
AST: 19 U/L (ref 5–34)
Albumin: 3.7 g/dL (ref 3.5–5.0)
Alkaline Phosphatase: 89 U/L (ref 40–150)
Calcium: 9.1 mg/dL (ref 8.4–10.4)
Chloride: 106 mEq/L (ref 98–107)
Potassium: 3.9 mEq/L (ref 3.5–5.1)
Sodium: 142 mEq/L (ref 136–145)

## 2012-08-27 NOTE — Telephone Encounter (Signed)
gve the pt her dec 2013 appt calendar °

## 2012-08-27 NOTE — Progress Notes (Signed)
OFFICE PROGRESS NOTE  CC: [name], MD [name], MD [name], MD [name], MD [address] [place] [address] [name], MD  DIAGNOSIS: 46 year old female with new diagnosis of rectal carcinoma by rectal ultrasound she was found to have a T3 N1 lesion.  PRIOR THERAPY:   #1 patient presented with a several month history of diarrhea and then subsequent bleeding. She was found to be anemic with a hemoglobin of 10. Because of this she went on to have a colonoscopy performed. The colonoscopy showed multiple sessile polyps in the cecum ascending transverse and sigmoid colon as well as the rectum. The rectal area showed a large exophytic rectal mass approximately 1 cm above the dentate line measuring 3-4 cm. She had a biopsy of this performed and was found to be an adenocarcinoma consistent with a rectal primary.  #2 patient was seen by Dr. [name] at [facility] who performed a rectal ultrasound. The staging clinically was T3 N1. It was recommended by Dr. [name] the patient undergo neoadjuvant chemotherapy and radiation higher to her definitive surgery.  #3 Patient has completed concurrent radiation and chemotherapy. Her chemotherapy consisted of single agent xeloda administrated 08/22/11 - 10/09/2011. Overall she tolerated her treatment very well  #4 Has completed all of neoadjuvant treatment And the PET scan shows no evidence of distant metastasis and a para rectal peritoneal nodes less impressive she has had significant response to therapy.  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 APC is negative  #6 patient had a laparotomy with proctocolectomy, J-pouch with ileoanal anastomosis, loop ileostomy on 12/14/2011. The final pathology did not reveal any evidence of residual disease. Patient had a  complicated postop course including high output ileostomy. She required intensive IV fluids on an outpatient basis to prevent dehydration.  #7 patient began adjuvant chemotherapy consisting of XELOX starting 03/05/2012 on a 21 day cycle. A Total of 6 cycles were planned unfortunate patient was not able to tolerate this therapy due to side effects from the xeloda With development of hand-foot syndrome.She was not able to tolerate oxaliplatin due to significant toxicity from cold sensitivity. In that this treatment is being discontinued.  #8 patient subsequently began adjuvant FOLFIRI beginning on 06/04/2012. She is planned for 4 cycles. She  completed all her chemotherapy on 07/16/12  CURRENT THERAPY: Observation  INTERVAL HISTORY: Jayana Cuervo 46 y.o. female returns for followup visit today. She was recently hospitalized with small bowel obstruction but that was relieved spontaneously with bowel rest. She has been having this intermittently. She in fact had over the holiday weekend. Today she feels much better she does have stool in her ostomy bag. She is a denying nausea vomiting fevers chills night sweats headaches she has no shortness of breath she is weak tired and fatigued her by mouth intake is reduced because she is very afraid of needing anything because of abdominal pain and discomfort she feels. She has not had any bleeding problems. Remainder of the review of systems is negative. MEDICAL HISTORY: Past Medical History  Diagnosis Date  . Anemia   . Diarrhea   . Rectal cancer 08/16/2011  . Colon cancer   . Anxiety   . Bright red rectal bleeding 09/13/2011  . History of chemotherapy     completed 09/2011   . Hx of radiation therapy 08/29/11 to  10/09/11    rectum    ALLERGIES:   has no known allergies.  MEDICATIONS:  Current Outpatient Prescriptions  Medication Sig Dispense Refill  . acetaminophen (TYLENOL) 325 MG tablet Take 650 mg by mouth every 6 (six) hours as needed.  Headache/pain      . Cyanocobalamin (VITAMIN B-12 ER PO) Take 1 tablet by mouth daily.      Marland Kitchen dexamethasone (DECADRON) 4 MG tablet Take 2 tablets by mouth two times a day starting the day after chemotherapy for 3 days. Take with food.  30 tablet  1  . diphenoxylate-atropine (LOMOTIL) 2.5-0.025 MG per tablet Take 1 tablet by mouth 4 (four) times daily as needed for diarrhea or loose stools.  60 tablet  5  . feeding supplement (ENSURE COMPLETE) LIQD Take 237 mLs by mouth 3 (three) times daily between meals.  50 Bottle  6  . Ferrous Sulfate (IRON) 28 MG TABS Take 1 tablet by mouth daily.      Marland Kitchen HYDROcodone-acetaminophen (VICODIN) 5-500 MG per tablet Take 1-2 tablets by mouth every 6 (six) hours as needed for pain.  30 tablet  0  . hydroxypropyl methylcellulose (ISOPTO TEARS) 2.5 % ophthalmic solution Place 1 drop into both eyes 4 (four) times daily as needed. Dry eyes      . loperamide (IMODIUM) 2 MG capsule Take 2 capsules by mouth Every 4 hours as needed. For diarrhea      . LORazepam (ATIVAN) 0.5 MG tablet Take 1 tablet (0.5 mg total) by mouth every 6 (six) hours as needed for anxiety.  30 tablet  0  . megestrol (MEGACE) 400 MG/10ML suspension Take 10 mLs (400 mg total) by mouth 2 (two) times daily.  240 mL  1  . Multiple Vitamin (MULTIVITAMIN) tablet Take 1 tablet by mouth every morning.       . ondansetron (ZOFRAN) 8 MG tablet Take 1 tablet two times a day starting the day after chemo for 3 days. Then take 1 tablet two times a day as needed for nausea or vomiting.  30 tablet  1  . pantoprazole (PROTONIX) 40 MG tablet Take 40 mg by mouth daily.      . polysaccharide iron (NIFEREX) 150 MG CAPS capsule Take 150 mg by mouth every morning.       . potassium chloride SA (K-DUR,KLOR-CON) 20 MEQ tablet Take 1 tablet (20 mEq total) by mouth daily.  30 tablet  6  . prochlorperazine (COMPAZINE) 10 MG tablet Take 1 tablet (10 mg total) by mouth every 6 (six) hours as needed (Nausea or vomiting).  30 tablet  1   . prochlorperazine (COMPAZINE) 25 MG suppository Place 1 suppository (25 mg total) rectally every 12 (twelve) hours as needed for nausea.  12 suppository  3  . simethicone (MYLICON) 80 MG chewable tablet Chew 80 mg by mouth every 6 (six) hours as needed. For gas       No current facility-administered medications for this visit.   Facility-Administered Medications Ordered in Other Visits  Medication Dose Route Frequency Provider Last Rate Last Dose  . heparin lock flush 100 unit/mL  500 Units Intravenous Once Victorino December, MD      . sodium chloride 0.9 % injection 10 mL  10 mL Intravenous PRN Victorino December, MD        SURGICAL HISTORY:  Past Surgical History  Procedure Date  . Tubal ligation   . Carpal tunnel release   . Colonoscopy 07/26/2011    Procedure:  COLONOSCOPY;  Surgeon: Arlyce Harman, MD;  Location: [facility];  Service: Endoscopy;  Laterality: N/A;  10:40  . Esophagogastroduodenoscopy 07/26/2011    Procedure: ESOPHAGOGASTRODUODENOSCOPY (EGD);  Surgeon: Arlyce Harman, MD;  Location: [facility];  Service: Endoscopy;  Laterality: N/A;  . Esophagogastroduodenoscopy 12/27/2011    Procedure: ESOPHAGOGASTRODUODENOSCOPY (EGD);  Surgeon: Shirley Friar, MD;  Location: [facility];  Service: Endoscopy;  Laterality: N/A;  . Laparoscopic colon resection 12/14/11    diverting ileostomy  . Portacath placement 02/21/2012    Procedure: INSERTION PORT-A-CATH;  Surgeon: [name], MD;  Location: [facility] OR;  Service: General;  Laterality: N/A;    REVIEW OF SYSTEMS:   General: fatigue (+), night sweats (-), fever (-), pain (-) Lymph: palpable nodes (-) HEENT: vision changes (-), mucositis (-), gum bleeding (-), epistaxis (-) Cardiovascular: chest pain (-), palpitations (-) Pulmonary: shortness of breath (-), dyspnea on exertion (-), cough (-), hemoptysis (-) GI:  Early satiety (-), melena (-), dysphagia (-), nausea/vomiting (-), diarrhea (+) GU: dysuria (-), hematuria (-),  incontinence (-) Musculoskeletal: joint swelling (-), joint pain (-), back pain (-) Neuro: weakness (-), numbness (-), headache (-), confusion (-) Skin: Rash (-), lesions (-), dryness (-) Psych: depression (-), suicidal/homicidal ideation (-), feeling of hopelessness (-)   PHYSICAL EXAMINATION:  BP 111/73  Pulse 70  Temp 97.6 F (36.4 C)  Resp 20  Ht 4\' 9"  (1.448 m)  Wt 79 lb 6.4 oz (36.016 kg)  BMI 17.18 kg/m2  LMP 02/20/2012 Gen.: Patient is a thin female but in no acute distress HEENT: PERRLA sclerae anicteric no conjunctival pallor oral mucosa is moist no thrush Neck: supple, no palpable adenopathy Lungs: clear to auscultation, no wheezes, rhonchi, or rales Cardiovascular: regular rate rhythm, S1, S2, no murmurs, rubs or gallops Abdomen: Soft, non-tender, non-distended, normoactive bowel sounds, no HSM Stoma site is pink Extremities: warm and well perfused, no clubbing, cyanosis, or edema Skin: No rashes or lesions ECOG PERFORMANCE STATUS: 1 - Symptomatic but completely ambulatory    LABORATORY DATA: Lab Results  Component Value Date   WBC 3.6* 08/27/2012   HGB 11.5* 08/27/2012   HCT 34.0* 08/27/2012   MCV 94.3 08/27/2012   PLT 159 08/27/2012       Chemistry      Component Value Date/Time   NA 141 08/04/2012 0540   NA 141 07/23/2012 0912   K 3.4* 08/04/2012 0540   K 3.3* 07/23/2012 0912   CL 103 08/04/2012 0540   CL 105 07/23/2012 0912   CO2 28 08/04/2012 0540   CO2 27 07/23/2012 0912   BUN 2* 08/04/2012 0540   BUN 8.0 07/23/2012 0912   CREATININE 0.59 08/04/2012 0540   CREATININE 0.7 07/23/2012 0912   CREATININE 0.70 07/27/2011 1306      Component Value Date/Time   CALCIUM 9.0 08/04/2012 0540   CALCIUM 9.3 07/23/2012 0912   ALKPHOS 148* 08/02/2012 1527   ALKPHOS 172* 07/23/2012 0912   AST 19 08/02/2012 1527   AST 15 07/23/2012 0912   ALT 13 08/02/2012 1527   ALT 19 07/23/2012 0912   BILITOT 0.6 08/02/2012 1527   BILITOT 0.74 07/23/2012 0912      ADDITIONAL INFORMATION: 1. Following placement in clearing solution, three more lymph nodes were identified, all of which are benign. Thus, the total lymph node count is 11. (JK:mw 12-18-11) 2. Following an exhaustive search, additional tissue has been submitted in an attempt to recover lymph nodes. Two more lymph nodes were identified, both  benign. This brings the total number of lymph nodes recovered to 13, all of which are benign. (JBK:eps 12/26/11) FINAL DIAGNOSIS Diagnosis 1. Colon, resection margin (donut), distal rectal - COLORECTAL MUCOSA WITH FIBROSIS AND EDEMA, CONSISTENT WITH TREATMENT. - THERE IS NO EVIDENCE OF MALIGNANCY. 2. Colon, total resection (incl lymph nodes) - COLON WITH SCATTERED TUBULAR ADENOMAS. - HIGH GRADE DYSPLASIA IS NOT IDENTIFIED. - BENIGN APPENDIX WITH FIBROUS OBLITERATION OF THE TIP. - THERE IS NO EVIDENCE OF CARCINOMA IN 8 OF 8 LYMPH NODES (0/8). - SEE ONCOLOGY TABLE BELOW. 3. Colon, resection margin (donut), new distal margin and rectum - BENIGN SQUAMOUS LINED MUCOSA. - THERE IS NO EVIDENCE OF MALIGNANCY. Microscopic Comment 2. COLON AND RECTUM Specimen: Colon, appendix, and terminal ileum. Procedure: Total colectomy. Tumor site: N/A Specimen integrity: Intact. Macroscopic intactness of mesorectum: Complete. Macroscopic tumor perforation: N/A Invasive tumor: N/A 1 of 3 FINAL for CYERA, BALBONI ([id number]) Microscopic Comment(continued) Microscopic extension of invasive tumor: N/A Lymph-Vascular invasion: N/A Peri-neural invasion: N/A Tumor deposit(s) (discontinuous extramural extension): N/A Resection margins: Negative for adenocarcinoma. Treatment effect (neoadjuvant therapy): Present, significant. Number of lymph nodes examined- 8 ; Number positive - 0 (PLEASE NOTE: Additional tissue will be submitted in an attempt to recover more lymph nodes) Additional polyp(s): Multiple scattered tubular adenomas. Pathologic Staging: ypT0,  ypN0 Ancillary studies: N/A Comments: Grossly, there is a 1.5 cm granular depressed area of mucosa present at the distal portion of the specimen. This entire area has been submitted for histologic evaluation revealing inflamed, fibrotic, and edematous colorectal mucosa, consistent with treatment effect. Adenocarcinoma is not identified. In addition, there are scattered tubular adenomas throughout the specimen. No high grade dysplasia is identified. The case was discussed with Dr. Donell Beers on 12/15/2011. (JBK:gt, 12/15/11) Pecola Leisure MD Pathologist, Electronic Signature (Case signed 12/15/2011) Intraoper   ASSESSMENT: 46 year old female with:  #1. stage III adenocarcinoma of the rectum. Patient is now status post definitive surgery after having received neoadjuvant chemotherapy and radiation therapy. Her final pathology does not reveal any evidence of residual disease. 8 nodes were negative for metastatic disease.  #2 . Patient was begun on adjuvant chemotherapy consisting of Xeloda and Oxaliplatin. She received one cycle. This course was complicated by development of a severe dehydration requiring hospitalization.  #3 Patient and I discussed rationale doing FOLFIRI. Risks and benefits of both 5-FU leucovorin and irinotecan were discussed with the patient. She understands literature was given to her.  A total of 4 cycles will be planned.  She received her first cycle on 06/04/12 - 07/16/12.  #4 intermittent bowel obstruction unclear etiology. I spoke to Dr. Donell Beers today and recommended getting another CT scan. She will also be seen by Dr. Donell Beers in the next few weeks.  PLAN:  #1 patient now will continue to see me once a month in followup.  #2 we'll get a CT of the abdomen with contrast.  #3 she will be seen back on December 17.  All questions were answered. The patient knows to call the clinic with any problems, questions or concerns. We can certainly see the patient much sooner if  necessary.  I spent >25 minutes counseling the patient face to face. The total time spent in the appointment was 30 minutes.  Drue Second, MD Medical/Oncology [facility] [phone] (beeper) [phone] (Office)  08/27/2012, 9:41 AM

## 2012-08-27 NOTE — Patient Instructions (Addendum)
Continue small frequent meals of light foods  We will see you back on 09/10/12

## 2012-09-04 ENCOUNTER — Telehealth: Payer: Self-pay | Admitting: Oncology

## 2012-09-04 NOTE — Telephone Encounter (Signed)
S/w the pt and she is aware of her cancelled dec 18th appts that hve been r/s to 09/20/2012@1 :45pm

## 2012-09-05 ENCOUNTER — Ambulatory Visit (HOSPITAL_COMMUNITY)
Admission: RE | Admit: 2012-09-05 | Discharge: 2012-09-05 | Disposition: A | Discharge status: Home / Self Care | Payer: BC Managed Care – PPO | Source: Ambulatory Visit | Attending: Oncology | Admitting: Oncology

## 2012-09-05 ENCOUNTER — Other Ambulatory Visit: Payer: Self-pay | Admitting: Oncology

## 2012-09-05 DIAGNOSIS — C20 Malignant neoplasm of rectum: Secondary | ICD-10-CM | POA: Insufficient documentation

## 2012-09-05 DIAGNOSIS — Z932 Ileostomy status: Secondary | ICD-10-CM | POA: Insufficient documentation

## 2012-09-05 DIAGNOSIS — IMO0002 Reserved for concepts with insufficient information to code with codable children: Secondary | ICD-10-CM | POA: Insufficient documentation

## 2012-09-05 MED ORDER — IOHEXOL 300 MG/ML  SOLN
100.0000 mL | Freq: Once | INTRAMUSCULAR | Status: AC | PRN
  Administered 2012-09-05: 100 mL via INTRAVENOUS

## 2012-09-06 ENCOUNTER — Telehealth: Payer: Self-pay | Admitting: Medical Oncology

## 2012-09-06 NOTE — Telephone Encounter (Signed)
Per MD, returned pts call and informed her that her CT scan looks good and reminded her of her f/u appt scheduled for 09/20/12. No further questions at this time.

## 2012-09-06 NOTE — Telephone Encounter (Signed)
CT scan looks good

## 2012-09-06 NOTE — Telephone Encounter (Signed)
Pt spouse called [facility] wanting to speak to MD about the CT results. Patient had CT 09/05/12. Spouse states " what are results and what to do next, she has same problem she had yesterday, we want to know Dr Welton Flakes what to do, please call." Will review message with MD. Next scheduled appt lab/NP 09/20/12.

## 2012-09-11 ENCOUNTER — Ambulatory Visit: Payer: BC Managed Care – PPO | Admitting: Adult Health

## 2012-09-11 ENCOUNTER — Other Ambulatory Visit: Payer: BC Managed Care – PPO | Admitting: Lab

## 2012-09-20 ENCOUNTER — Telehealth: Payer: Self-pay | Admitting: Oncology

## 2012-09-20 ENCOUNTER — Other Ambulatory Visit (HOSPITAL_BASED_OUTPATIENT_CLINIC_OR_DEPARTMENT_OTHER): Payer: BC Managed Care – PPO | Admitting: Lab

## 2012-09-20 ENCOUNTER — Ambulatory Visit (HOSPITAL_BASED_OUTPATIENT_CLINIC_OR_DEPARTMENT_OTHER): Payer: BC Managed Care – PPO | Admitting: Adult Health

## 2012-09-20 ENCOUNTER — Ambulatory Visit: Payer: BC Managed Care – PPO | Admitting: Adult Health

## 2012-09-20 ENCOUNTER — Ambulatory Visit (HOSPITAL_BASED_OUTPATIENT_CLINIC_OR_DEPARTMENT_OTHER): Payer: BC Managed Care – PPO | Admitting: Lab

## 2012-09-20 ENCOUNTER — Encounter: Payer: Self-pay | Admitting: Adult Health

## 2012-09-20 VITALS — BP 98/65 | HR 79 | Temp 97.2°F | Resp 20 | Ht <= 58 in | Wt 77.2 lb

## 2012-09-20 DIAGNOSIS — K56609 Unspecified intestinal obstruction, unspecified as to partial versus complete obstruction: Secondary | ICD-10-CM

## 2012-09-20 DIAGNOSIS — C20 Malignant neoplasm of rectum: Secondary | ICD-10-CM

## 2012-09-20 DIAGNOSIS — R8281 Pyuria: Secondary | ICD-10-CM

## 2012-09-20 DIAGNOSIS — R82998 Other abnormal findings in urine: Secondary | ICD-10-CM

## 2012-09-20 LAB — COMPREHENSIVE METABOLIC PANEL (CC13)
ALT: 9 U/L (ref 0–55)
AST: 19 U/L (ref 5–34)
Alkaline Phosphatase: 84 U/L (ref 40–150)
BUN: 14 mg/dL (ref 7.0–26.0)
Calcium: 8.9 mg/dL (ref 8.4–10.4)
Chloride: 104 mEq/L (ref 98–107)
Creatinine: 0.7 mg/dL (ref 0.6–1.1)
Potassium: 3.7 mEq/L (ref 3.5–5.1)

## 2012-09-20 LAB — URINALYSIS, MICROSCOPIC - CHCC
Bilirubin (Urine): NEGATIVE
Ketones: NEGATIVE mg/dL
Protein: <30 mg/dL
Specific Gravity, Urine: 1.03 (ref 1.003–1.035)
pH: 5 (ref 4.6–8.0)

## 2012-09-20 LAB — CBC WITH DIFFERENTIAL/PLATELET
BASO%: 0.2 % (ref 0.0–2.0)
EOS%: 1.6 % (ref 0.0–7.0)
MCH: 31.3 pg (ref 25.1–34.0)
MCHC: 34.4 g/dL (ref 31.5–36.0)
MCV: 91 fL (ref 79.5–101.0)
MONO%: 7 % (ref 0.0–14.0)
NEUT%: 70.7 % (ref 38.4–76.8)
RDW: 12.9 % (ref 11.2–14.5)
lymph#: 0.8 10*3/uL — ABNORMAL LOW (ref 0.9–3.3)

## 2012-09-20 NOTE — Patient Instructions (Addendum)
Doing well.  CT scans were good, no recurrence.  Try drinking two ensures a day in addition to your meals.  Please call us if you have any questions or concerns.

## 2012-09-20 NOTE — Telephone Encounter (Signed)
Pt sent back to lab and given appt schedule for January thru March.

## 2012-09-20 NOTE — Progress Notes (Signed)
OFFICE PROGRESS NOTE  CC: Renee Eva, MD Renee Herald, MD Renee Lint, MD Renee Nicholson., MD [address] [place] [address] [name], MD  DIAGNOSIS: 46 year old female with new diagnosis of rectal carcinoma by rectal ultrasound she was found to have a T3 N1 lesion.  PRIOR THERAPY:   #1 patient presented with a several month history of diarrhea and then subsequent bleeding. She was found to be anemic with a hemoglobin of 10. Because of this she went on to have a colonoscopy performed. The colonoscopy showed multiple sessile polyps in the cecum ascending transverse and sigmoid colon as well as the rectum. The rectal area showed a large exophytic rectal mass approximately 1 cm above the dentate line measuring 3-4 cm. She had a biopsy of this performed and was found to be an adenocarcinoma consistent with a rectal primary.  #2 patient was seen by Dr. Carlynn Nicholson at [facility] who performed a rectal ultrasound. The staging clinically was T3 N1. It was recommended by Dr. [name] the patient undergo neoadjuvant chemotherapy and radiation higher to her definitive surgery.  #3 Patient has completed concurrent radiation and chemotherapy. Her chemotherapy consisted of single agent xeloda administrated 08/22/11 - 10/09/2011. Overall she tolerated her treatment very well  #4 Has completed all of neoadjuvant treatment And the PET scan shows no evidence of distant metastasis and a para rectal peritoneal nodes less impressive she has had significant response to therapy.  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 APC is negative  #6 patient had a laparotomy with proctocolectomy, J-pouch with ileoanal anastomosis, loop ileostomy on 12/14/2011. The final pathology did not reveal any evidence of residual disease. Patient had a  complicated postop course including high output ileostomy. She required intensive IV fluids on an outpatient basis to prevent dehydration.  #7 patient began adjuvant chemotherapy consisting of XELOX starting 03/05/2012 on a 21 day cycle. A Total of 6 cycles were planned unfortunate patient was not able to tolerate this therapy due to side effects from the xeloda With development of hand-foot syndrome.She was not able to tolerate oxaliplatin due to significant toxicity from cold sensitivity. In that this treatment is being discontinued.  #8 patient subsequently began adjuvant FOLFIRI beginning on 06/04/2012. She is planned for 4 cycles. She  completed all her chemotherapy on 07/16/12  CURRENT THERAPY: Observation  INTERVAL HISTORY: Renee Nicholson 46 y.o. female returns for followup visit today. She's doing well today.  She continues to have stool output in her ostomy bag.  She's had no further obstruction or pain.  She has lost 2 lbs since her last visit.  Her daughters have accompanied her today and state her intake is very poor.  She also is c/o white discharge in her urine.  She has no dysuria, or hematuria, no abd/flank pain.  No diarrhea, fevers, chills.  Otherwise she's doing well and a 10 point ROS is neg.   MEDICAL HISTORY: Past Medical History  Diagnosis Date  . Anemia   . Diarrhea   . Rectal cancer 08/16/2011  . Colon cancer   . Anxiety   . Bright red rectal bleeding 09/13/2011  . History of chemotherapy     completed 09/2011   . Hx of radiation therapy 08/29/11 to 10/09/11    rectum    ALLERGIES:   has no known allergies.  MEDICATIONS:  Current Outpatient Prescriptions  Medication Sig Dispense Refill  . acetaminophen (TYLENOL) 325 MG tablet Take 650 mg by mouth every 6 (six) hours as needed. Headache/pain      . Cyanocobalamin (VITAMIN B-12 ER PO) Take 1 tablet by mouth daily.      Marland Kitchen dexamethasone (DECADRON) 4 MG tablet Take 2 tablets by mouth two times a day starting the day  after chemotherapy for 3 days. Take with food.  30 tablet  1  . diphenoxylate-atropine (LOMOTIL) 2.5-0.025 MG per tablet Take 1 tablet by mouth 4 (four) times daily as needed for diarrhea or loose stools.  60 tablet  5  . feeding supplement (ENSURE COMPLETE) LIQD Take 237 mLs by mouth 3 (three) times daily between meals.  50 Bottle  6  . Ferrous Sulfate (IRON) 28 MG TABS Take 1 tablet by mouth daily.      Marland Kitchen HYDROcodone-acetaminophen (VICODIN) 5-500 MG per tablet Take 1-2 tablets by mouth every 6 (six) hours as needed for pain.  30 tablet  0  . hydroxypropyl methylcellulose (ISOPTO TEARS) 2.5 % ophthalmic solution Place 1 drop into both eyes 4 (four) times daily as needed. Dry eyes      . loperamide (IMODIUM) 2 MG capsule Take 2 capsules by mouth Every 4 hours as needed. For diarrhea      . LORazepam (ATIVAN) 0.5 MG tablet Take 1 tablet (0.5 mg total) by mouth every 6 (six) hours as needed for anxiety.  30 tablet  0  . megestrol (MEGACE) 400 MG/10ML suspension Take 10 mLs (400 mg total) by mouth 2 (two) times daily.  240 mL  1  . Multiple Vitamin (MULTIVITAMIN) tablet Take 1 tablet by mouth every morning.       . ondansetron (ZOFRAN) 8 MG tablet Take 1 tablet two times a day starting the day after chemo for 3 days. Then take 1 tablet two times a day as needed for nausea or vomiting.  30 tablet  1  . pantoprazole (PROTONIX) 40 MG tablet Take 40 mg by mouth daily.      . polysaccharide iron (NIFEREX) 150 MG CAPS capsule Take 150 mg by mouth every morning.       . potassium chloride SA (K-DUR,KLOR-CON) 20 MEQ tablet Take 1 tablet (20 mEq total) by mouth daily.  30 tablet  6  . prochlorperazine (COMPAZINE) 10 MG tablet Take 1 tablet (10 mg total) by mouth every 6 (six) hours as needed (Nausea or vomiting).  30 tablet  1  . prochlorperazine (COMPAZINE) 25 MG suppository Place 1 suppository (25 mg total) rectally every 12 (twelve) hours as needed for nausea.  12 suppository  3  . simethicone (MYLICON) 80 MG  chewable tablet Chew 80 mg by mouth every 6 (six) hours as needed. For gas       No current facility-administered medications for this visit.   Facility-Administered Medications Ordered in Other Visits  Medication Dose Route Frequency Provider Last Rate Last Dose  . heparin lock flush 100 unit/mL  500 Units Intravenous Once Victorino December, MD      . sodium chloride 0.9 % injection 10 mL  10 mL Intravenous PRN Victorino December, MD        SURGICAL HISTORY:  Past Surgical History  Procedure Date  . Tubal ligation   . Carpal tunnel release   . Colonoscopy 07/26/2011    Procedure: COLONOSCOPY;  Surgeon: Arlyce Harman, MD;  Location: [facility];  Service: Endoscopy;  Laterality: N/A;  10:40  . Esophagogastroduodenoscopy 07/26/2011    Procedure: ESOPHAGOGASTRODUODENOSCOPY (EGD);  Surgeon: Arlyce Harman, MD;  Location: [facility];  Service: Endoscopy;  Laterality: N/A;  . Esophagogastroduodenoscopy 12/27/2011    Procedure: ESOPHAGOGASTRODUODENOSCOPY (EGD);  Surgeon: Shirley Friar, MD;  Location: [facility];  Service: Endoscopy;  Laterality: N/A;  . Laparoscopic colon resection 12/14/11    diverting ileostomy  . Portacath placement 02/21/2012    Procedure: INSERTION PORT-A-CATH;  Surgeon: Renee Lint, MD;  Location: [facility] OR;  Service: General;  Laterality: N/A;    REVIEW OF SYSTEMS:   General: fatigue (+), night sweats (-), fever (-), pain (-) Lymph: palpable nodes (-) HEENT: vision changes (-), mucositis (-), gum bleeding (-), epistaxis (-) Cardiovascular: chest pain (-), palpitations (-) Pulmonary: shortness of breath (-), dyspnea on exertion (-), cough (-), hemoptysis (-) GI:  Early satiety (-), melena (-), dysphagia (-), nausea/vomiting (-), diarrhea (-) GU: dysuria (-), hematuria (-), incontinence (-) Musculoskeletal: joint swelling (-), joint pain (-), back pain (-) Neuro: weakness (-), numbness (-), headache (-), confusion (-) Skin: Rash (-), lesions (-), dryness  (-) Psych: depression (-), suicidal/homicidal ideation (-), feeling of hopelessness (-)   PHYSICAL EXAMINATION:  BP 98/65  Pulse 79  Temp 97.2 F (36.2 C) (Oral)  Resp 20  Ht 4\' 9"  (1.448 m)  Wt 77 lb 3.2 oz (35.018 kg)  BMI 16.71 kg/m2  LMP 02/20/2012 Gen.: Patient is a thin female but in no acute distress HEENT: PERRLA sclerae anicteric no conjunctival pallor oral mucosa is moist no thrush Neck: supple, no palpable adenopathy Lungs: clear to auscultation, no wheezes, rhonchi, or rales Cardiovascular: regular rate rhythm, S1, S2, no murmurs, rubs or gallops Abdomen: Soft, non-tender, non-distended, normoactive bowel sounds, no HSM Stoma site is pink Extremities: warm and well perfused, no clubbing, cyanosis, or edema Skin: No rashes or lesions ECOG PERFORMANCE STATUS: 1 - Symptomatic but completely ambulatory    LABORATORY DATA: Lab Results  Component Value Date   WBC 4.1 09/20/2012   HGB 11.9 09/20/2012   HCT 34.6* 09/20/2012   MCV 91.0 09/20/2012   PLT 168 09/20/2012       Chemistry      Component Value Date/Time   NA 144 09/20/2012 1410   NA 141 08/04/2012 0540   K 3.7 09/20/2012 1410   K 3.4* 08/04/2012 0540   CL 104 09/20/2012 1410   CL 103 08/04/2012 0540   CO2 27 09/20/2012 1410   CO2 28 08/04/2012 0540   BUN 14.0 09/20/2012 1410   BUN 2* 08/04/2012 0540   CREATININE 0.7 09/20/2012 1410   CREATININE 0.59 08/04/2012 0540   CREATININE 0.70 07/27/2011 1306      Component Value Date/Time   CALCIUM 8.9 09/20/2012 1410   CALCIUM 9.0 08/04/2012 0540   ALKPHOS 84 09/20/2012 1410   ALKPHOS 148* 08/02/2012 1527   AST 19 09/20/2012 1410   AST 19 08/02/2012 1527   ALT 9 09/20/2012 1410   ALT 13 08/02/2012 1527   BILITOT 0.50 09/20/2012 1410   BILITOT 0.6 08/02/2012 1527     ADDITIONAL INFORMATION: 1. Following placement in clearing solution, three more lymph nodes were identified, all of which are benign. Thus, the total lymph node count is 11. (JK:mw  12-18-11) 2. Following an exhaustive search, additional tissue has been submitted in an attempt to recover lymph nodes. Two more lymph nodes were identified, both benign. This brings the total number of lymph nodes recovered to 13, all of which are benign. (JBK:eps  12/26/11) FINAL DIAGNOSIS Diagnosis 1. Colon, resection margin (donut), distal rectal - COLORECTAL MUCOSA WITH FIBROSIS AND EDEMA, CONSISTENT WITH TREATMENT. - THERE IS NO EVIDENCE OF MALIGNANCY. 2. Colon, total resection (incl lymph nodes) - COLON WITH SCATTERED TUBULAR ADENOMAS. - HIGH GRADE DYSPLASIA IS NOT IDENTIFIED. - BENIGN APPENDIX WITH FIBROUS OBLITERATION OF THE TIP. - THERE IS NO EVIDENCE OF CARCINOMA IN 8 OF 8 LYMPH NODES (0/8). - SEE ONCOLOGY TABLE BELOW. 3. Colon, resection margin (donut), new distal margin and rectum - BENIGN SQUAMOUS LINED MUCOSA. - THERE IS NO EVIDENCE OF MALIGNANCY. Microscopic Comment 2. COLON AND RECTUM Specimen: Colon, appendix, and terminal ileum. Procedure: Total colectomy. Tumor site: N/A Specimen integrity: Intact. Macroscopic intactness of mesorectum: Complete. Macroscopic tumor perforation: N/A Invasive tumor: N/A 1 of 3 FINAL for Renee Nicholson, Renee Nicholson ([id number]) Microscopic Comment(continued) Microscopic extension of invasive tumor: N/A Lymph-Vascular invasion: N/A Peri-neural invasion: N/A Tumor deposit(s) (discontinuous extramural extension): N/A Resection margins: Negative for adenocarcinoma. Treatment effect (neoadjuvant therapy): Present, significant. Number of lymph nodes examined- 8 ; Number positive - 0 (PLEASE NOTE: Additional tissue will be submitted in an attempt to recover more lymph nodes) Additional polyp(s): Multiple scattered tubular adenomas. Pathologic Staging: ypT0, ypN0 Ancillary studies: N/A Comments: Grossly, there is a 1.5 cm granular depressed area of mucosa present at the distal portion of the specimen. This entire area has been submitted for  histologic evaluation revealing inflamed, fibrotic, and edematous colorectal mucosa, consistent with treatment effect. Adenocarcinoma is not identified. In addition, there are scattered tubular adenomas throughout the specimen. No high grade dysplasia is identified. The case was discussed with Dr. Donell Beers on 12/15/2011. (JBK:gt, 12/15/11) Pecola Leisure MD Pathologist, Electronic Signature (Case signed 12/15/2011) Intraoper   ASSESSMENT: 46 year old female with:  #1. stage III adenocarcinoma of the rectum. Patient is now status post definitive surgery after having received neoadjuvant chemotherapy and radiation therapy. Her final pathology does not reveal any evidence of residual disease. 8 nodes were negative for metastatic disease.  #2 . Patient was begun on adjuvant chemotherapy consisting of Xeloda and Oxaliplatin. She received one cycle. This course was complicated by development of a severe dehydration requiring hospitalization.  #3 Patient and I discussed rationale doing FOLFIRI. Risks and benefits of both 5-FU leucovorin and irinotecan were discussed with the patient. She understands literature was given to her.  A total of 4 cycles will be planned.  She received her first cycle on 06/04/12 - 07/16/12.  #4 intermittent bowel obstruction unclear etiology. I spoke to Dr. Donell Beers today and recommended getting another CT scan. She will also be seen by Dr. Donell Beers in the next few weeks.  PLAN:  #1 Doing well.  Last CT of A/P looked good.  Results were previously reviewed with pt by Dr. Welton Flakes over phone.  She will f/u monthly.    #2 I added on a urinalysis and culture.    All questions were answered. The patient knows to call the clinic with any problems, questions or concerns. We can certainly see the patient much sooner if necessary.  I spent >25 minutes counseling the patient face to face. The total time spent in the appointment was 30 minutes.  Cherie Ouch Lyn Hollingshead, NP Medical  Oncology [facility] Phone: [phone]    09/20/2012, 3:33 PM

## 2012-10-17 ENCOUNTER — Telehealth: Payer: Self-pay | Admitting: Oncology

## 2012-10-17 ENCOUNTER — Telehealth: Payer: Self-pay | Admitting: Medical Oncology

## 2012-10-17 ENCOUNTER — Ambulatory Visit (HOSPITAL_BASED_OUTPATIENT_CLINIC_OR_DEPARTMENT_OTHER): Payer: BC Managed Care – PPO | Admitting: Adult Health

## 2012-10-17 ENCOUNTER — Other Ambulatory Visit (HOSPITAL_BASED_OUTPATIENT_CLINIC_OR_DEPARTMENT_OTHER): Payer: BC Managed Care – PPO | Admitting: Lab

## 2012-10-17 ENCOUNTER — Encounter: Payer: Self-pay | Admitting: Adult Health

## 2012-10-17 VITALS — BP 100/69 | HR 77 | Temp 98.0°F | Resp 18 | Ht <= 58 in | Wt 78.5 lb

## 2012-10-17 DIAGNOSIS — K56609 Unspecified intestinal obstruction, unspecified as to partial versus complete obstruction: Secondary | ICD-10-CM

## 2012-10-17 DIAGNOSIS — E860 Dehydration: Secondary | ICD-10-CM

## 2012-10-17 DIAGNOSIS — R197 Diarrhea, unspecified: Secondary | ICD-10-CM

## 2012-10-17 DIAGNOSIS — C20 Malignant neoplasm of rectum: Secondary | ICD-10-CM

## 2012-10-17 DIAGNOSIS — R198 Other specified symptoms and signs involving the digestive system and abdomen: Secondary | ICD-10-CM

## 2012-10-17 LAB — CBC WITH DIFFERENTIAL/PLATELET
Basophils Absolute: 0 10*3/uL (ref 0.0–0.1)
Eosinophils Absolute: 0.1 10*3/uL (ref 0.0–0.5)
HGB: 13.1 g/dL (ref 11.6–15.9)
MCV: 87.2 fL (ref 79.5–101.0)
MONO#: 0.4 10*3/uL (ref 0.1–0.9)
MONO%: 9.8 % (ref 0.0–14.0)
NEUT#: 2.3 10*3/uL (ref 1.5–6.5)
RBC: 4.36 10*6/uL (ref 3.70–5.45)
RDW: 12.7 % (ref 11.2–14.5)
WBC: 3.8 10*3/uL — ABNORMAL LOW (ref 3.9–10.3)
lymph#: 1.1 10*3/uL (ref 0.9–3.3)

## 2012-10-17 LAB — COMPREHENSIVE METABOLIC PANEL (CC13)
Albumin: 3.7 g/dL (ref 3.5–5.0)
Alkaline Phosphatase: 95 U/L (ref 40–150)
BUN: 17 mg/dL (ref 7.0–26.0)
Calcium: 9.7 mg/dL (ref 8.4–10.4)
Chloride: 101 mEq/L (ref 98–107)
Glucose: 82 mg/dl (ref 70–99)
Potassium: 4.2 mEq/L (ref 3.5–5.1)
Sodium: 137 mEq/L (ref 136–145)
Total Protein: 7.2 g/dL (ref 6.4–8.3)

## 2012-10-17 NOTE — Telephone Encounter (Signed)
gv pt appt schedule for February/March.

## 2012-10-17 NOTE — Telephone Encounter (Signed)
Per MD, called patient and spoke with spouse that her potassium is good and to continue her potassium pills on daily. Spouse verbalized understanding, no further questions at this time.

## 2012-10-17 NOTE — Progress Notes (Signed)
OFFICE PROGRESS NOTE  CC: Renee Eva, MD [name], MD [name], MD [name], MD [address] [place] [address] [name], MD  DIAGNOSIS: 47 year old female with new diagnosis of rectal carcinoma by rectal ultrasound she was found to have a T3 N1 lesion.  PRIOR THERAPY:   #1 patient presented with a several month history of diarrhea and then subsequent bleeding. She was found to be anemic with a hemoglobin of 10. Because of this she went on to have a colonoscopy performed. The colonoscopy showed multiple sessile polyps in the cecum ascending transverse and sigmoid colon as well as the rectum. The rectal area showed a large exophytic rectal mass approximately 1 cm above the dentate line measuring 3-4 cm. She had a biopsy of this performed and was found to be an adenocarcinoma consistent with a rectal primary.  #2 patient was seen by Dr. [name] at [facility] who performed a rectal ultrasound. The staging clinically was T3 N1. It was recommended by Dr. [name] the patient undergo neoadjuvant chemotherapy and radiation higher to her definitive surgery.  #3 Patient has completed concurrent radiation and chemotherapy. Her chemotherapy consisted of single agent xeloda administrated 08/22/11 - 10/09/2011. Overall she tolerated her treatment very well  #4 Has completed all of neoadjuvant treatment And the PET scan shows no evidence of distant metastasis and a para rectal peritoneal nodes less impressive she has had significant response to therapy.  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 APC is negative  #6 patient had a laparotomy with proctocolectomy, J-pouch with ileoanal anastomosis, loop ileostomy on 12/14/2011. The final pathology did not reveal any evidence of residual disease. Patient had a  complicated postop course including high output ileostomy. She required intensive IV fluids on an outpatient basis to prevent dehydration.  #7 patient began adjuvant chemotherapy consisting of XELOX starting 03/05/2012 on a 21 day cycle. A Total of 6 cycles were planned unfortunate patient was not able to tolerate this therapy due to side effects from the xeloda With development of hand-foot syndrome.She was not able to tolerate oxaliplatin due to significant toxicity from cold sensitivity. In that this treatment is being discontinued.  #8 patient subsequently began adjuvant FOLFIRI beginning on 06/04/2012. She is planned for 4 cycles. She  completed all her chemotherapy on 07/16/12  CURRENT THERAPY: Observation  INTERVAL HISTORY: Renee Nicholson 46 y.o. female returns for followup visit today. She's doing well today.  She continues to have stool output in her ostomy bag.  She's had no further obstruction or pain.  She has lost 2 lbs since her last visit.  Her daughters have accompanied her today and state her intake is very poor.  She also is c/o white discharge in her urine.  She has no dysuria, or hematuria, no abd/flank pain.  No diarrhea, fevers, chills.  Otherwise she's doing well and a 10 point ROS is neg.   MEDICAL HISTORY: Past Medical History  Diagnosis Date  . Anemia   . Diarrhea   . Rectal cancer 08/16/2011  . Colon cancer   . Anxiety   . Bright red rectal bleeding 09/13/2011  . History of chemotherapy     completed 09/2011   . Hx of radiation therapy 08/29/11 to 10/09/11    rectum    ALLERGIES:   has no known allergies.  MEDICATIONS:  Current Outpatient Prescriptions  Medication Sig Dispense Refill  . acetaminophen (TYLENOL) 325 MG tablet Take 650 mg by mouth every 6 (six) hours as needed. Headache/pain      . Cyanocobalamin (VITAMIN B-12 ER PO) Take 1 tablet by mouth daily.      . diphenoxylate-atropine (LOMOTIL) 2.5-0.025 MG per tablet Take 1 tablet by mouth 4 (four) times  daily as needed for diarrhea or loose stools.  60 tablet  5  . feeding supplement (ENSURE COMPLETE) LIQD Take 237 mLs by mouth 3 (three) times daily between meals.  50 Bottle  6  . Ferrous Sulfate (IRON) 28 MG TABS Take 1 tablet by mouth daily.      Marland Kitchen loperamide (IMODIUM) 2 MG capsule Take 2 capsules by mouth Every 4 hours as needed. For diarrhea      . Multiple Vitamin (MULTIVITAMIN) tablet Take 1 tablet by mouth every morning.       . pantoprazole (PROTONIX) 40 MG tablet Take 40 mg by mouth daily.      . polysaccharide iron (NIFEREX) 150 MG CAPS capsule Take 150 mg by mouth every morning.       . potassium chloride SA (K-DUR,KLOR-CON) 20 MEQ tablet Take 1 tablet (20 mEq total) by mouth daily.  30 tablet  6  . simethicone (MYLICON) 80 MG chewable tablet Chew 80 mg by mouth every 6 (six) hours as needed. For gas      . dexamethasone (DECADRON) 4 MG tablet Take 2 tablets by mouth two times a day starting the day after chemotherapy for 3 days. Take with food.  30 tablet  1  . HYDROcodone-acetaminophen (VICODIN) 5-500 MG per tablet Take 1-2 tablets by mouth every 6 (six) hours as needed for pain.  30 tablet  0  . hydroxypropyl methylcellulose (ISOPTO TEARS) 2.5 % ophthalmic solution Place 1 drop into both eyes 4 (four) times daily as needed. Dry eyes      . LORazepam (ATIVAN) 0.5 MG tablet Take 1 tablet (0.5 mg total) by mouth every 6 (six) hours as needed for anxiety.  30 tablet  0  . megestrol (MEGACE) 400 MG/10ML suspension Take 10 mLs (400 mg total) by mouth 2 (two) times daily.  240 mL  1  . ondansetron (ZOFRAN) 8 MG tablet Take 1 tablet two times a day starting the day after chemo for 3 days. Then take 1 tablet two times a day as needed for nausea or vomiting.  30 tablet  1  . prochlorperazine (COMPAZINE) 10 MG tablet Take 1 tablet (10 mg total) by mouth every 6 (six) hours as needed (Nausea or vomiting).  30 tablet  1  . prochlorperazine (COMPAZINE) 25 MG suppository Place 1 suppository (25 mg  total) rectally every 12 (twelve) hours as needed for nausea.  12 suppository  3   No current facility-administered medications for this visit.   Facility-Administered Medications Ordered in Other Visits  Medication Dose Route Frequency Provider Last Rate Last Dose  . heparin lock flush 100 unit/mL  500 Units Intravenous Once Victorino December, MD      . sodium chloride 0.9 % injection 10 mL  10 mL Intravenous PRN Victorino December, MD        SURGICAL HISTORY:  Past Surgical History  Procedure Date  . Tubal ligation   . Carpal tunnel release   . Colonoscopy 07/26/2011    Procedure: COLONOSCOPY;  Surgeon: Arlyce Harman, MD;  Location: [facility];  Service: Endoscopy;  Laterality: N/A;  10:40  . Esophagogastroduodenoscopy 07/26/2011    Procedure: ESOPHAGOGASTRODUODENOSCOPY (EGD);  Surgeon: Arlyce Harman, MD;  Location: [facility];  Service: Endoscopy;  Laterality: N/A;  . Esophagogastroduodenoscopy 12/27/2011    Procedure: ESOPHAGOGASTRODUODENOSCOPY (EGD);  Surgeon: Shirley Friar, MD;  Location: [facility];  Service: Endoscopy;  Laterality: N/A;  . Laparoscopic colon resection 12/14/11    diverting ileostomy  . Portacath placement 02/21/2012    Procedure: INSERTION PORT-A-CATH;  Surgeon: [name], MD;  Location: [facility] OR;  Service: General;  Laterality: N/A;    REVIEW OF SYSTEMS:   General: fatigue (+), night sweats (-), fever (-), pain (-) Lymph: palpable nodes (-) HEENT: vision changes (-), mucositis (-), gum bleeding (-), epistaxis (-) Cardiovascular: chest pain (-), palpitations (-) Pulmonary: shortness of breath (-), dyspnea on exertion (-), cough (-), hemoptysis (-) GI:  Early satiety (-), melena (-), dysphagia (-), nausea/vomiting (-), diarrhea (-) GU: dysuria (-), hematuria (-), incontinence (-) Musculoskeletal: joint swelling (-), joint pain (-), back pain (-) Neuro: weakness (-), numbness (-), headache (-), confusion (-) Skin: Rash (-), lesions (-), dryness  (-) Psych: depression (-), suicidal/homicidal ideation (-), feeling of hopelessness (-)   PHYSICAL EXAMINATION:  BP 100/69  Pulse 77  Temp 98 F (36.7 C) (Oral)  Resp 18  Ht 4\' 9"  (1.448 m)  Wt 78 lb 8 oz (35.607 kg)  BMI 16.99 kg/m2  LMP 02/20/2012 Gen.: Patient is a thin female but in no acute distress HEENT: PERRLA sclerae anicteric no conjunctival pallor oral mucosa is moist no thrush Neck: supple, no palpable adenopathy Lungs: clear to auscultation, no wheezes, rhonchi, or rales Cardiovascular: regular rate rhythm, S1, S2, no murmurs, rubs or gallops Abdomen: Soft, non-tender, non-distended, normoactive bowel sounds, no HSM Stoma site is pink Extremities: warm and well perfused, no clubbing, cyanosis, or edema Skin: No rashes or lesions ECOG PERFORMANCE STATUS: 1 - Symptomatic but completely ambulatory    LABORATORY DATA: Lab Results  Component Value Date   WBC 3.8* 10/17/2012   HGB 13.1 10/17/2012   HCT 38.0 10/17/2012   MCV 87.2 10/17/2012   PLT 181 10/17/2012       Chemistry      Component Value Date/Time   NA 144 09/20/2012 1410   NA 141 08/04/2012 0540   K 3.7 09/20/2012 1410   K 3.4* 08/04/2012 0540   CL 104 09/20/2012 1410   CL 103 08/04/2012 0540   CO2 27 09/20/2012 1410   CO2 28 08/04/2012 0540   BUN 14.0 09/20/2012 1410   BUN 2* 08/04/2012 0540   CREATININE 0.7 09/20/2012 1410   CREATININE 0.59 08/04/2012 0540   CREATININE 0.70 07/27/2011 1306      Component Value Date/Time   CALCIUM 8.9 09/20/2012 1410   CALCIUM 9.0 08/04/2012 0540   ALKPHOS 84 09/20/2012 1410   ALKPHOS 148* 08/02/2012 1527   AST 19 09/20/2012 1410   AST 19 08/02/2012 1527   ALT 9 09/20/2012 1410   ALT 13 08/02/2012 1527   BILITOT 0.50 09/20/2012 1410   BILITOT 0.6 08/02/2012 1527     ADDITIONAL INFORMATION: 1. Following placement in clearing solution, three more lymph nodes were identified, all of which are benign. Thus, the total lymph node count is 11. (JK:mw 12-18-11) 2.  Following an exhaustive search, additional tissue has been submitted in an attempt to recover lymph nodes. Two more lymph nodes were identified, both benign. This brings the total number of lymph nodes recovered to 13, all of which are benign. (JBK:eps  12/26/11) FINAL DIAGNOSIS Diagnosis 1. Colon, resection margin (donut), distal rectal - COLORECTAL MUCOSA WITH FIBROSIS AND EDEMA, CONSISTENT WITH TREATMENT. - THERE IS NO EVIDENCE OF MALIGNANCY. 2. Colon, total resection (incl lymph nodes) - COLON WITH SCATTERED TUBULAR ADENOMAS. - HIGH GRADE DYSPLASIA IS NOT IDENTIFIED. - BENIGN APPENDIX WITH FIBROUS OBLITERATION OF THE TIP. - THERE IS NO EVIDENCE OF CARCINOMA IN 8 OF 8 LYMPH NODES (0/8). - SEE ONCOLOGY TABLE BELOW. 3. Colon, resection margin (donut), new distal margin and rectum - BENIGN SQUAMOUS LINED MUCOSA. - THERE IS NO EVIDENCE OF MALIGNANCY. Microscopic Comment 2. COLON AND RECTUM Specimen: Colon, appendix, and terminal ileum. Procedure: Total colectomy. Tumor site: N/A Specimen integrity: Intact. Macroscopic intactness of mesorectum: Complete. Macroscopic tumor perforation: N/A Invasive tumor: N/A 1 of 3 FINAL for ADALAE, BAYSINGER ([id number]) Microscopic Comment(continued) Microscopic extension of invasive tumor: N/A Lymph-Vascular invasion: N/A Peri-neural invasion: N/A Tumor deposit(s) (discontinuous extramural extension): N/A Resection margins: Negative for adenocarcinoma. Treatment effect (neoadjuvant therapy): Present, significant. Number of lymph nodes examined- 8 ; Number positive - 0 (PLEASE NOTE: Additional tissue will be submitted in an attempt to recover more lymph nodes) Additional polyp(s): Multiple scattered tubular adenomas. Pathologic Staging: ypT0, ypN0 Ancillary studies: N/A Comments: Grossly, there is a 1.5 cm granular depressed area of mucosa present at the distal portion of the specimen. This entire area has been submitted for histologic  evaluation revealing inflamed, fibrotic, and edematous colorectal mucosa, consistent with treatment effect. Adenocarcinoma is not identified. In addition, there are scattered tubular adenomas throughout the specimen. No high grade dysplasia is identified. The case was discussed with Dr. Donell Beers on 12/15/2011. (JBK:gt, 12/15/11) Pecola Leisure MD Pathologist, Electronic Signature (Case signed 12/15/2011) Intraoper   ASSESSMENT: 47 year old female with:  #1. stage III adenocarcinoma of the rectum. Patient is now status post definitive surgery after having received neoadjuvant chemotherapy and radiation therapy. Her final pathology does not reveal any evidence of residual disease. 8 nodes were negative for metastatic disease.  #2 . Patient was begun on adjuvant chemotherapy consisting of Xeloda and Oxaliplatin. She received one cycle. This course was complicated by development of a severe dehydration requiring hospitalization.  #3 Patient and I discussed rationale doing FOLFIRI. Risks and benefits of both 5-FU leucovorin and irinotecan were discussed with the patient. She understands literature was given to her.  A total of 4 cycles will be planned.  She received her first cycle on 06/04/12 - 07/16/12.  #4 intermittent bowel obstruction unclear etiology. I spoke to Dr. Donell Beers today and recommended getting another CT scan. She will also be seen by Dr. Donell Beers in the next few weeks.  PLAN:  #1 Doing well.  Last CT of A/P looked good.  Results were previously reviewed with pt by Dr. Welton Flakes over phone.  She will f/u monthly.    #2 I added on a urinalysis and culture.    All questions were answered. The patient knows to call the clinic with any problems, questions or concerns. We can certainly see the patient much sooner if necessary.  I spent >25 minutes counseling the patient face to face. The total time spent in the appointment was 30 minutes.  Cherie Ouch Lyn Hollingshead, NP [facility] Phone: [phone]    10/17/2012, 11:24 AM

## 2012-10-17 NOTE — Patient Instructions (Signed)
Doing well.  Continue with your diet and supplementing with Ensure twice a day and boost at night.  Please call us if you have any questions or concerns.    We will see you back next month.

## 2012-10-17 NOTE — Progress Notes (Signed)
OFFICE PROGRESS NOTE  CC: [name], MD [name], MD [name], MD [name], MD [address] [place] [address] [name], MD  DIAGNOSIS: 47 year old female with new diagnosis of rectal carcinoma by rectal ultrasound she was found to have a T3 N1 lesion.  PRIOR THERAPY:   #1 patient presented with a several month history of diarrhea and then subsequent bleeding. She was found to be anemic with a hemoglobin of 10. Because of this she went on to have a colonoscopy performed. The colonoscopy showed multiple sessile polyps in the cecum ascending transverse and sigmoid colon as well as the rectum. The rectal area showed a large exophytic rectal mass approximately 1 cm above the dentate line measuring 3-4 cm. She had a biopsy of this performed and was found to be an adenocarcinoma consistent with a rectal primary.  #2 patient was seen by Dr. [name] at [facility] who performed a rectal ultrasound. The staging clinically was T3 N1. It was recommended by Dr. [name] the patient undergo neoadjuvant chemotherapy and radiation higher to her definitive surgery.  #3 Patient has completed concurrent radiation and chemotherapy. Her chemotherapy consisted of single agent xeloda administrated 08/22/11 - 10/09/2011. Overall she tolerated her treatment very well  #4 Has completed all of neoadjuvant treatment And the PET scan shows no evidence of distant metastasis and a para rectal peritoneal nodes less impressive she has had significant response to therapy.  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 APC is negative  #6 patient had a laparotomy with proctocolectomy, J-pouch with ileoanal anastomosis, loop ileostomy on 12/14/2011. The final pathology did not reveal any evidence of residual disease. Patient had a  complicated postop course including high output ileostomy. She required intensive IV fluids on an outpatient basis to prevent dehydration.  #7 patient began adjuvant chemotherapy consisting of XELOX starting 03/05/2012 on a 21 day cycle. A Total of 6 cycles were planned unfortunate patient was not able to tolerate this therapy due to side effects from the xeloda With development of hand-foot syndrome.She was not able to tolerate oxaliplatin due to significant toxicity from cold sensitivity. In that this treatment is being discontinued.  #8 patient subsequently began adjuvant FOLFIRI beginning on 06/04/2012. She is planned for 4 cycles. She  completed all her chemotherapy on 07/16/12  CURRENT THERAPY: Observation  INTERVAL HISTORY: Renee Nicholson 46 y.o. female returns for followup visit today. She is improving.  She's gained 1.5 lbs since her last visit last month. Her diarrhea is improving and she continues to take one Lomotil per day and is doing well.  Her intake has improved.  She is more tired, but her mother in law had open heart surgery and she is doing all of the housework and cooking for a household of 7.  Otherwise a 10 point ros is neg.   MEDICAL HISTORY: Past Medical History  Diagnosis Date  . Anemia   . Diarrhea   . Rectal cancer 08/16/2011  . Colon cancer   . Anxiety   . Bright red rectal bleeding 09/13/2011  . History of chemotherapy     completed 09/2011   . Hx of radiation therapy 08/29/11 to 10/09/11    rectum    ALLERGIES:   has no known allergies.  MEDICATIONS:  Current Outpatient Prescriptions  Medication Sig Dispense Refill  .  acetaminophen (TYLENOL) 325 MG tablet Take 650 mg by mouth every 6 (six) hours as needed. Headache/pain      . Cyanocobalamin (VITAMIN B-12 ER PO) Take 1 tablet by mouth daily.      . diphenoxylate-atropine (LOMOTIL) 2.5-0.025 MG per tablet Take 1 tablet by mouth 4 (four) times daily as needed for diarrhea or loose stools.  60 tablet  5  .  feeding supplement (ENSURE COMPLETE) LIQD Take 237 mLs by mouth 3 (three) times daily between meals.  50 Bottle  6  . Ferrous Sulfate (IRON) 28 MG TABS Take 1 tablet by mouth daily.      Marland Kitchen loperamide (IMODIUM) 2 MG capsule Take 2 capsules by mouth Every 4 hours as needed. For diarrhea      . Multiple Vitamin (MULTIVITAMIN) tablet Take 1 tablet by mouth every morning.       . pantoprazole (PROTONIX) 40 MG tablet Take 40 mg by mouth daily.      . polysaccharide iron (NIFEREX) 150 MG CAPS capsule Take 150 mg by mouth every morning.       . potassium chloride SA (K-DUR,KLOR-CON) 20 MEQ tablet Take 1 tablet (20 mEq total) by mouth daily.  30 tablet  6  . simethicone (MYLICON) 80 MG chewable tablet Chew 80 mg by mouth every 6 (six) hours as needed. For gas      . dexamethasone (DECADRON) 4 MG tablet Take 2 tablets by mouth two times a day starting the day after chemotherapy for 3 days. Take with food.  30 tablet  1  . HYDROcodone-acetaminophen (VICODIN) 5-500 MG per tablet Take 1-2 tablets by mouth every 6 (six) hours as needed for pain.  30 tablet  0  . hydroxypropyl methylcellulose (ISOPTO TEARS) 2.5 % ophthalmic solution Place 1 drop into both eyes 4 (four) times daily as needed. Dry eyes      . LORazepam (ATIVAN) 0.5 MG tablet Take 1 tablet (0.5 mg total) by mouth every 6 (six) hours as needed for anxiety.  30 tablet  0  . megestrol (MEGACE) 400 MG/10ML suspension Take 10 mLs (400 mg total) by mouth 2 (two) times daily.  240 mL  1  . ondansetron (ZOFRAN) 8 MG tablet Take 1 tablet two times a day starting the day after chemo for 3 days. Then take 1 tablet two times a day as needed for nausea or vomiting.  30 tablet  1  . prochlorperazine (COMPAZINE) 10 MG tablet Take 1 tablet (10 mg total) by mouth every 6 (six) hours as needed (Nausea or vomiting).  30 tablet  1  . prochlorperazine (COMPAZINE) 25 MG suppository Place 1 suppository (25 mg total) rectally every 12 (twelve) hours as needed for nausea.  12  suppository  3   No current facility-administered medications for this visit.   Facility-Administered Medications Ordered in Other Visits  Medication Dose Route Frequency Provider Last Rate Last Dose  . heparin lock flush 100 unit/mL  500 Units Intravenous Once Victorino December, MD      . sodium chloride 0.9 % injection 10 mL  10 mL Intravenous PRN Victorino December, MD        SURGICAL HISTORY:  Past Surgical History  Procedure Date  . Tubal ligation   . Carpal tunnel release   . Colonoscopy 07/26/2011    Procedure: COLONOSCOPY;  Surgeon: Arlyce Harman, MD;  Location: [facility];  Service: Endoscopy;  Laterality: N/A;  10:40  . Esophagogastroduodenoscopy 07/26/2011    Procedure:  ESOPHAGOGASTRODUODENOSCOPY (EGD);  Surgeon: Arlyce Harman, MD;  Location: [facility];  Service: Endoscopy;  Laterality: N/A;  . Esophagogastroduodenoscopy 12/27/2011    Procedure: ESOPHAGOGASTRODUODENOSCOPY (EGD);  Surgeon: Shirley Friar, MD;  Location: [facility];  Service: Endoscopy;  Laterality: N/A;  . Laparoscopic colon resection 12/14/11    diverting ileostomy  . Portacath placement 02/21/2012    Procedure: INSERTION PORT-A-CATH;  Surgeon: [name], MD;  Location: [facility] OR;  Service: General;  Laterality: N/A;    REVIEW OF SYSTEMS:   General: fatigue (+), night sweats (-), fever (-), pain (-) Lymph: palpable nodes (-) HEENT: vision changes (-), mucositis (-), gum bleeding (-), epistaxis (-) Cardiovascular: chest pain (-), palpitations (-) Pulmonary: shortness of breath (-), dyspnea on exertion (-), cough (-), hemoptysis (-) GI:  Early satiety (-), melena (-), dysphagia (-), nausea/vomiting (-), diarrhea (-) GU: dysuria (-), hematuria (-), incontinence (-) Musculoskeletal: joint swelling (-), joint pain (-), back pain (-) Neuro: weakness (-), numbness (-), headache (-), confusion (-) Skin: Rash (-), lesions (-), dryness (-) Psych: depression (-), suicidal/homicidal ideation (-), feeling of  hopelessness (-)   PHYSICAL EXAMINATION:  BP 100/69  Pulse 77  Temp 98 F (36.7 C) (Oral)  Resp 18  Ht 4\' 9"  (1.448 m)  Wt 78 lb 8 oz (35.607 kg)  BMI 16.99 kg/m2  LMP 02/20/2012 Gen.: Patient is a thin female but in no acute distress HEENT: PERRLA sclerae anicteric no conjunctival pallor oral mucosa is moist no thrush Neck: supple, no palpable adenopathy Lungs: clear to auscultation, no wheezes, rhonchi, or rales Cardiovascular: regular rate rhythm, S1, S2, no murmurs, rubs or gallops Abdomen: Soft, non-tender, non-distended, normoactive bowel sounds, no HSM, stoma pink, watery green stool in bag Stoma site is pink Extremities: warm and well perfused, no clubbing, cyanosis, or edema Skin: No rashes or lesions ECOG PERFORMANCE STATUS: 1 - Symptomatic but completely ambulatory    LABORATORY DATA: Lab Results  Component Value Date   WBC 3.8* 10/17/2012   HGB 13.1 10/17/2012   HCT 38.0 10/17/2012   MCV 87.2 10/17/2012   PLT 181 10/17/2012       Chemistry      Component Value Date/Time   NA 137 10/17/2012 1101   NA 141 08/04/2012 0540   K 4.2 10/17/2012 1101   K 3.4* 08/04/2012 0540   CL 101 10/17/2012 1101   CL 103 08/04/2012 0540   CO2 29 10/17/2012 1101   CO2 28 08/04/2012 0540   BUN 17.0 10/17/2012 1101   BUN 2* 08/04/2012 0540   CREATININE 0.7 10/17/2012 1101   CREATININE 0.59 08/04/2012 0540   CREATININE 0.70 07/27/2011 1306      Component Value Date/Time   CALCIUM 9.7 10/17/2012 1101   CALCIUM 9.0 08/04/2012 0540   ALKPHOS 95 10/17/2012 1101   ALKPHOS 148* 08/02/2012 1527   AST 24 10/17/2012 1101   AST 19 08/02/2012 1527   ALT 20 10/17/2012 1101   ALT 13 08/02/2012 1527   BILITOT 0.57 10/17/2012 1101   BILITOT 0.6 08/02/2012 1527     ADDITIONAL INFORMATION: 1. Following placement in clearing solution, three more lymph nodes were identified, all of which are benign. Thus, the total lymph node count is 11. (JK:mw 12-18-11) 2. Following an exhaustive search, additional  tissue has been submitted in an attempt to recover lymph nodes. Two more lymph nodes were identified, both benign. This brings the total number of lymph nodes recovered to 13, all of which are benign. (JBK:eps 12/26/11) FINAL  DIAGNOSIS Diagnosis 1. Colon, resection margin (donut), distal rectal - COLORECTAL MUCOSA WITH FIBROSIS AND EDEMA, CONSISTENT WITH TREATMENT. - THERE IS NO EVIDENCE OF MALIGNANCY. 2. Colon, total resection (incl lymph nodes) - COLON WITH SCATTERED TUBULAR ADENOMAS. - HIGH GRADE DYSPLASIA IS NOT IDENTIFIED. - BENIGN APPENDIX WITH FIBROUS OBLITERATION OF THE TIP. - THERE IS NO EVIDENCE OF CARCINOMA IN 8 OF 8 LYMPH NODES (0/8). - SEE ONCOLOGY TABLE BELOW. 3. Colon, resection margin (donut), new distal margin and rectum - BENIGN SQUAMOUS LINED MUCOSA. - THERE IS NO EVIDENCE OF MALIGNANCY. Microscopic Comment 2. COLON AND RECTUM Specimen: Colon, appendix, and terminal ileum. Procedure: Total colectomy. Tumor site: N/A Specimen integrity: Intact. Macroscopic intactness of mesorectum: Complete. Macroscopic tumor perforation: N/A Invasive tumor: N/A 1 of 3 FINAL for Renee Nicholson, Renee Nicholson ([id number]) Microscopic Comment(continued) Microscopic extension of invasive tumor: N/A Lymph-Vascular invasion: N/A Peri-neural invasion: N/A Tumor deposit(s) (discontinuous extramural extension): N/A Resection margins: Negative for adenocarcinoma. Treatment effect (neoadjuvant therapy): Present, significant. Number of lymph nodes examined- 8 ; Number positive - 0 (PLEASE NOTE: Additional tissue will be submitted in an attempt to recover more lymph nodes) Additional polyp(s): Multiple scattered tubular adenomas. Pathologic Staging: ypT0, ypN0 Ancillary studies: N/A Comments: Grossly, there is a 1.5 cm granular depressed area of mucosa present at the distal portion of the specimen. This entire area has been submitted for histologic evaluation revealing inflamed, fibrotic,  and edematous colorectal mucosa, consistent with treatment effect. Adenocarcinoma is not identified. In addition, there are scattered tubular adenomas throughout the specimen. No high grade dysplasia is identified. The case was discussed with Dr. Donell Beers on 12/15/2011. (JBK:gt, 12/15/11) Pecola Leisure MD Pathologist, Electronic Signature (Case signed 12/15/2011) Intraoper   ASSESSMENT: 47 year old female with:  #1. stage III adenocarcinoma of the rectum. Patient is now status post definitive surgery after having received neoadjuvant chemotherapy and radiation therapy. Her final pathology does not reveal any evidence of residual disease. 8 nodes were negative for metastatic disease.  #2 . Patient was begun on adjuvant chemotherapy consisting of Xeloda and Oxaliplatin. She received one cycle. This course was complicated by development of a severe dehydration requiring hospitalization.  #3 Patient and I discussed rationale doing FOLFIRI. Risks and benefits of both 5-FU leucovorin and irinotecan were discussed with the patient. She understands literature was given to her.  A total of 4 cycles will be planned.  She received her first cycle on 06/04/12 - 07/16/12.  #4 intermittent bowel obstruction unclear etiology. I spoke to Dr. Donell Beers today and recommended getting another CT scan. She will also be seen by Dr. Donell Beers in the next few weeks.  PLAN:  #1 Doing well.  Last CT of A/P looked good.  Results were previously reviewed with pt by Dr. Welton Flakes over phone.  She will continue to f/u monthly.    #2 I reinforced her continuing on her current diet and taking it easy with the housework and cooking.    All questions were answered. The patient knows to call the clinic with any problems, questions or concerns. We can certainly see the patient much sooner if necessary.  I spent >25 minutes counseling the patient face to face. The total time spent in the appointment was 30 minutes.  Cherie Ouch Lyn Hollingshead,  NP Medical Oncology [facility] Phone: [phone]    10/17/2012, 12:40 PM

## 2012-10-18 ENCOUNTER — Encounter (INDEPENDENT_AMBULATORY_CARE_PROVIDER_SITE_OTHER): Payer: Self-pay | Admitting: General Surgery

## 2012-10-18 ENCOUNTER — Ambulatory Visit (INDEPENDENT_AMBULATORY_CARE_PROVIDER_SITE_OTHER): Payer: BC Managed Care – PPO | Admitting: General Surgery

## 2012-10-18 VITALS — BP 104/76 | HR 80 | Temp 99.0°F | Resp 12 | Ht <= 58 in | Wt 77.8 lb

## 2012-10-18 DIAGNOSIS — C20 Malignant neoplasm of rectum: Secondary | ICD-10-CM

## 2012-10-18 NOTE — Assessment & Plan Note (Signed)
Pt has not gained any weight but has increased energy compared to previous.  Cannot get smallest finger through anastamosis.    Will plan exam under anesthesia and anal dilation.  After that, will obtain barium enema to evaluate pouch integrity.

## 2012-10-18 NOTE — Patient Instructions (Signed)
Will set up exam under anesthesia and dilation of anus.  That will be an outpt procedure.

## 2012-10-18 NOTE — Progress Notes (Signed)
HISTORY: Pt has been doing a little better overall, but she still has episodes of frequent gas pains, nausea and vomiting.  She is overall having more energy.  She has gained a pound and a half, but remains under 80 pounds.  Her mother-in-law just had cardiac bypass grafting, and she has been doing more around the house.  She tires by the afternoon.     PERTINENT REVIEW OF SYSTEMS: Otherwise negative.     EXAM: Head: Normocephalic and atraumatic.  Eyes:  Conjunctivae are normal. Pupils are equal, round, and reactive to light. No scleral icterus.  Resp: No respiratory distress, normal effort. Abd:  Abdomen is soft, non distended and non tender. No masses are palpable.  There is no rebound and no guarding. Ostomy output fairly watery. Rectal:  Cannot get smallest finger through anal sphincter.Pt has pain with exam.    Neurological: Alert and oriented to person, place, and time. Coordination normal.  Skin: Skin is warm and dry. No rash noted. No diaphoretic. No erythema. No pallor.  Psychiatric: Normal mood and affect. Normal behavior. Judgment and thought content normal.      ASSESSMENT AND PLAN:   Rectal cancer s/p proctocolectomy, j-pouch with ileoanal anastomosis, loop ileostomy on 12/14/11 Pt has not gained any weight but has increased energy compared to previous.  Cannot get smallest finger through anastamosis.    Will plan exam under anesthesia and anal dilation.  After that, will obtain barium enema to evaluate pouch integrity and muscular ability.      Maudry Diego, MD Surgical Oncology, General & Endocrine Surgery [facility]  [name], MD [name], MD

## 2012-10-30 ENCOUNTER — Encounter (HOSPITAL_COMMUNITY): Payer: Self-pay

## 2012-10-30 ENCOUNTER — Encounter (HOSPITAL_COMMUNITY)
Admission: RE | Admit: 2012-10-30 | Discharge: 2012-10-30 | Disposition: A | Discharge status: Home / Self Care | Payer: BC Managed Care – PPO | Source: Ambulatory Visit | Attending: General Surgery | Admitting: General Surgery

## 2012-10-30 ENCOUNTER — Encounter (HOSPITAL_COMMUNITY): Payer: Self-pay | Admitting: Pharmacy Technician

## 2012-10-30 LAB — SURGICAL PCR SCREEN: Staphylococcus aureus: NEGATIVE

## 2012-10-30 NOTE — Pre-Procedure Instructions (Signed)
PT HAS CBC WITH DIFF AND CMET REPORTS IN EPIC DONE 10/17/12 AND CXR REPORT 02/21/12 IN EPIC AND EKG REPORT 02/15/12 IN EPIC.  NO OTHER PREOP LABS NEEDED PER ANESTHESIOLOGIST'S GUIDELINES.

## 2012-10-30 NOTE — Patient Instructions (Addendum)
YOUR SURGERY IS SCHEDULED AT [facility]  ON:  WED  2/12    REPORT TO [facility] SHORT STAY CENTER AT:  10:00 AM      PHONE # FOR SHORT STAY IS [phone]  DO NOT EAT ANYTHING AFTER MIDNIGHT THE NIGHT BEFORE YOUR SURGERY.   NO FOOD, NO CHEWING GUM, NO MINTS, NO CANDIES, NO CHEWING TOBACCO.  YOU MAY HAVE CLEAR LIQUIDS TO DRINK FROM MIDNIGHT THE NIGHT BEFORE SURGERY UNTIL 6:00 AM  -- LIKE WATER.    NOTHING TO DRINK AFTER 6:00 AM DAY OF SURGERY.  PLEASE TAKE THE FOLLOWING MEDICATIONS THE AM OF YOUR SURGERY WITH A FEW SIPS OF WATER:  PANTOPRAZOLE,  IF YOU USE INHALERS--USE YOUR INHALERS THE AM OF YOUR SURGERY AND BRING INHALERS TO THE HOSPITAL -TAKE TO SURGERY.    IF YOU ARE DIABETIC:  DO NOT TAKE ANY DIABETIC MEDICATIONS THE AM OF YOUR SURGERY.  IF YOU TAKE INSULIN IN THE EVENINGS--PLEASE ONLY TAKE 1/2 NORMAL EVENING DOSE THE NIGHT BEFORE YOUR SURGERY.  NO INSULIN THE AM OF YOUR SURGERY.  IF YOU HAVE SLEEP APNEA AND USE CPAP OR BIPAP--PLEASE BRING THE MASK AND THE TUBING.  DO NOT BRING YOUR MACHINE.  DO NOT BRING VALUABLES, MONEY, CREDIT CARDS.  DO NOT WEAR JEWELRY, MAKE-UP, NAIL POLISH AND NO METAL PINS OR CLIPS IN YOUR HAIR. CONTACT LENS, DENTURES / PARTIALS, GLASSES SHOULD NOT BE WORN TO SURGERY AND IN MOST CASES-HEARING AIDS WILL NEED TO BE REMOVED.  BRING YOUR GLASSES CASE, ANY EQUIPMENT NEEDED FOR YOUR CONTACT LENS. FOR PATIENTS ADMITTED TO THE HOSPITAL--CHECK OUT TIME THE DAY OF DISCHARGE IS 11:00 AM.  ALL INPATIENT ROOMS ARE PRIVATE - WITH BATHROOM, TELEPHONE, TELEVISION AND WIFI INTERNET.  IF YOU ARE BEING DISCHARGED THE SAME DAY OF YOUR SURGERY--YOU CAN NOT DRIVE YOURSELF HOME--AND SHOULD NOT GO HOME ALONE BY TAXI OR BUS.  NO DRIVING OR OPERATING MACHINERY FOR 24 HOURS FOLLOWING ANESTHESIA / PAIN MEDICATIONS.  PLEASE MAKE ARRANGEMENTS FOR SOMEONE TO BE WITH YOU AT HOME THE FIRST 24 HOURS AFTER SURGERY. RESPONSIBLE DRIVER'S NAME  PT'S HUSBAND  UMESH Noyola                            PHONE #   [phone]                             PLEASE READ OVER ANY  FACT SHEETS THAT YOU WERE GIVEN: MRSA INFORMATION, BLOOD TRANSFUSION INFORMATION, INCENTIVE SPIROMETER INFORMATION. FAILURE TO FOLLOW THESE INSTRUCTIONS MAY RESULT IN THE CANCELLATION OF YOUR SURGERY.   PATIENT SIGNATURE_________________________________

## 2012-11-01 NOTE — Pre-Procedure Instructions (Signed)
GUJARATI INTERPRETER REQUESTED WITH [facility] FOR DATE OF SURGERY--AND CONFIRMATION OF THE SCHEDULING OF INTERPRETER RECEIVED AND ON CHART.

## 2012-11-06 ENCOUNTER — Encounter (HOSPITAL_COMMUNITY): Payer: Self-pay | Admitting: *Deleted

## 2012-11-06 ENCOUNTER — Ambulatory Visit (HOSPITAL_COMMUNITY)
Admission: RE | Admit: 2012-11-06 | Discharge: 2012-11-06 | Disposition: A | Discharge status: Home / Self Care | Payer: BC Managed Care – PPO | Source: Ambulatory Visit | Attending: General Surgery | Admitting: General Surgery

## 2012-11-06 ENCOUNTER — Ambulatory Visit (HOSPITAL_COMMUNITY): Payer: BC Managed Care – PPO | Admitting: Anesthesiology

## 2012-11-06 ENCOUNTER — Encounter (HOSPITAL_COMMUNITY): Payer: Self-pay | Admitting: Anesthesiology

## 2012-11-06 ENCOUNTER — Encounter (HOSPITAL_COMMUNITY)
Admission: RE | Disposition: A | Discharge status: Home / Self Care | Payer: Self-pay | Source: Ambulatory Visit | Attending: General Surgery

## 2012-11-06 DIAGNOSIS — K624 Stenosis of anus and rectum: Secondary | ICD-10-CM | POA: Insufficient documentation

## 2012-11-06 DIAGNOSIS — Z01812 Encounter for preprocedural laboratory examination: Secondary | ICD-10-CM | POA: Insufficient documentation

## 2012-11-06 DIAGNOSIS — Z980 Intestinal bypass and anastomosis status: Secondary | ICD-10-CM | POA: Insufficient documentation

## 2012-11-06 DIAGNOSIS — C20 Malignant neoplasm of rectum: Secondary | ICD-10-CM | POA: Insufficient documentation

## 2012-11-06 HISTORY — PX: EVALUATION UNDER ANESTHESIA WITH ANAL FISSUROTOMY: SHX5622

## 2012-11-06 SURGERY — EXAM UNDER ANESTHESIA WITH ANAL FISSUROTOMY
Anesthesia: General | Site: Anus | Wound class: Clean Contaminated

## 2012-11-06 MED ORDER — BUPIVACAINE LIPOSOME 1.3 % IJ SUSP
INTRAMUSCULAR | Status: DC | PRN
  Administered 2012-11-06: 20 mL

## 2012-11-06 MED ORDER — LACTATED RINGERS IV SOLN: INTRAVENOUS | Status: DC

## 2012-11-06 MED ORDER — LACTATED RINGERS IV SOLN
INTRAVENOUS | Status: DC | PRN
  Administered 2012-11-06: 11:00:00 via INTRAVENOUS

## 2012-11-06 MED ORDER — FENTANYL CITRATE 0.05 MG/ML IJ SOLN: 25.0000 ug | INTRAMUSCULAR | Status: DC | PRN

## 2012-11-06 MED ORDER — CEFOXITIN SODIUM 2 G IV SOLR
2.0000 g | INTRAVENOUS | Status: AC
  Administered 2012-11-06: 2 g via INTRAVENOUS
  Filled 2012-11-06: qty 2

## 2012-11-06 MED ORDER — MIDAZOLAM HCL 5 MG/5ML IJ SOLN
INTRAMUSCULAR | Status: DC | PRN
  Administered 2012-11-06: 1 mg via INTRAVENOUS

## 2012-11-06 MED ORDER — DIBUCAINE 1 % RE OINT
TOPICAL_OINTMENT | RECTAL | Status: DC | PRN
  Administered 2012-11-06: 1 via RECTAL

## 2012-11-06 MED ORDER — ACETAMINOPHEN 10 MG/ML IV SOLN
INTRAVENOUS | Status: AC
  Filled 2012-11-06: qty 100

## 2012-11-06 MED ORDER — ACETAMINOPHEN 325 MG PO TABS
650.0000 mg | ORAL_TABLET | Freq: Four times a day (QID) | ORAL | Status: AC | PRN
  Administered 2012-11-06: 650 mg via ORAL
  Filled 2012-11-06: qty 2

## 2012-11-06 MED ORDER — DIBUCAINE 1 % EX OINT: TOPICAL_OINTMENT | Freq: Three times a day (TID) | CUTANEOUS | Status: DC | PRN

## 2012-11-06 MED ORDER — ACETAMINOPHEN 10 MG/ML IV SOLN
INTRAVENOUS | Status: DC | PRN
  Administered 2012-11-06: 1000 mg via INTRAVENOUS

## 2012-11-06 MED ORDER — DEXAMETHASONE SODIUM PHOSPHATE 4 MG/ML IJ SOLN
INTRAMUSCULAR | Status: DC | PRN
  Administered 2012-11-06: 8 mg via INTRAVENOUS

## 2012-11-06 MED ORDER — BUPIVACAINE LIPOSOME 1.3 % IJ SUSP
20.0000 mL | Freq: Once | INTRAMUSCULAR | Status: DC
  Filled 2012-11-06: qty 20

## 2012-11-06 MED ORDER — HYDROCODONE-ACETAMINOPHEN 5-500 MG PO TABS: 1.0000 | ORAL_TABLET | Freq: Four times a day (QID) | ORAL | Status: DC | PRN

## 2012-11-06 MED ORDER — PROPOFOL 10 MG/ML IV EMUL
INTRAVENOUS | Status: DC | PRN
  Administered 2012-11-06: 100 mg via INTRAVENOUS

## 2012-11-06 MED ORDER — ONDANSETRON HCL 4 MG/2ML IJ SOLN
INTRAMUSCULAR | Status: DC | PRN
  Administered 2012-11-06: 4 mg via INTRAVENOUS

## 2012-11-06 MED ORDER — CEFOXITIN SODIUM-DEXTROSE 1-4 GM-% IV SOLR (PREMIX)
INTRAVENOUS | Status: AC
  Filled 2012-11-06: qty 100

## 2012-11-06 MED ORDER — FENTANYL CITRATE 0.05 MG/ML IJ SOLN
INTRAMUSCULAR | Status: DC | PRN
  Administered 2012-11-06: 50 ug via INTRAVENOUS

## 2012-11-06 MED ORDER — DIBUCAINE 1 % RE OINT
TOPICAL_OINTMENT | RECTAL | Status: AC
  Filled 2012-11-06: qty 28

## 2012-11-06 MED ORDER — EPHEDRINE SULFATE 50 MG/ML IJ SOLN
INTRAMUSCULAR | Status: DC | PRN
  Administered 2012-11-06: 5 mg via INTRAVENOUS

## 2012-11-06 MED ORDER — LIDOCAINE HCL 1 % IJ SOLN
INTRAMUSCULAR | Status: DC | PRN
  Administered 2012-11-06: 40 mg via INTRADERMAL

## 2012-11-06 SURGICAL SUPPLY — 17 items
BLADE SURG 15 STRL LF DISP TIS (BLADE) ×1 IMPLANT
BLADE SURG 15 STRL SS (BLADE) ×1
DECANTER SPIKE VIAL GLASS SM (MISCELLANEOUS) ×2 IMPLANT
DRAPE UTILITY 15X26 (DRAPE) IMPLANT
DRSG PAD ABDOMINAL 8X10 ST (GAUZE/BANDAGES/DRESSINGS) ×2 IMPLANT
ELECT CAUTERY BLADE 6.4 (BLADE) ×2 IMPLANT
ELECT REM PT RETURN 9FT ADLT (ELECTROSURGICAL) ×2
ELECTRODE REM PT RTRN 9FT ADLT (ELECTROSURGICAL) ×1 IMPLANT
GAUZE SPONGE 4X4 16PLY XRAY LF (GAUZE/BANDAGES/DRESSINGS) ×2 IMPLANT
GOWN STRL REIN 2XL LVL4 (GOWN DISPOSABLE) ×2 IMPLANT
KIT BASIN OR (CUSTOM PROCEDURE TRAY) ×2 IMPLANT
LUBRICANT JELLY K Y 4OZ (MISCELLANEOUS) ×2 IMPLANT
PACK LITHOTOMY IV (CUSTOM PROCEDURE TRAY) ×2 IMPLANT
SPONGE GAUZE 4X4 12PLY (GAUZE/BANDAGES/DRESSINGS) ×2 IMPLANT
SPONGE HEMORRHOID 8X3CM (HEMOSTASIS) ×2 IMPLANT
TOWEL OR 17X26 10 PK STRL BLUE (TOWEL DISPOSABLE) ×2 IMPLANT
YANKAUER SUCT BULB TIP NO VENT (SUCTIONS) ×2 IMPLANT

## 2012-11-06 NOTE — Transfer of Care (Signed)
Immediate Anesthesia Transfer of Care Note  Patient: Renee Nicholson  Procedure(s) Performed: Procedure(s) with comments: Exam Under Anesthesia , Anal Dilation (N/A) - Exam Under Anesthesia , Anal Dilation  Patient Location: PACU  Anesthesia Type:General  Level of Consciousness: sedated, patient cooperative and responds to stimulation  Airway & Oxygen Therapy: Patient Spontanous Breathing and Patient connected to face mask oxygen  Post-op Assessment: Report given to PACU RN and Post -op Vital signs reviewed and stable  Post vital signs: Reviewed and stable  Complications: No apparent anesthesia complications

## 2012-11-06 NOTE — Interval H&P Note (Signed)
History and Physical Interval Note:  11/06/2012 10:59 AM  Renee Nicholson  has presented today for surgery, with the diagnosis of anal stricture/rectal cancer  The various methods of treatment have been discussed with the patient and family. After consideration of risks, benefits and other options for treatment, the patient has consented to  Procedure(s) with comments: Exam Under Anesthesia , Anal Dilation (N/A) - Exam Under Anesthesia , Anal Dilation as a surgical intervention .  The patient's history has been reviewed, patient examined, no change in status, stable for surgery.  I have reviewed the patient's chart and labs.  Questions were answered to the patient's satisfaction.     [name]

## 2012-11-06 NOTE — Preoperative (Signed)
Beta Blockers   Reason not to administer Beta Blockers:Not Applicable 

## 2012-11-06 NOTE — Anesthesia Preprocedure Evaluation (Addendum)
Anesthesia Evaluation  Patient identified by MRN, date of birth, ID band Patient awake    Reviewed: Allergy & Precautions, H&P , NPO status , Patient's Chart, lab work & pertinent test results  Airway Mallampati: II TM Distance: >3 FB Neck ROM: full    Dental no notable dental hx. (+) Teeth Intact and Dental Advisory Given   Pulmonary neg pulmonary ROS,  breath sounds clear to auscultation  Pulmonary exam normal       Cardiovascular Exercise Tolerance: Good negative cardio ROS  Rhythm:regular Rate:Normal     Neuro/Psych negative neurological ROS  negative psych ROS   GI/Hepatic negative GI ROS, Neg liver ROS, GERD-  Medicated and Controlled,  Endo/Other  negative endocrine ROS  Renal/GU negative Renal ROS  negative genitourinary   Musculoskeletal   Abdominal   Peds  Hematology negative hematology ROS (+)   Anesthesia Other Findings   Reproductive/Obstetrics negative OB ROS                           Anesthesia Physical Anesthesia Plan  ASA: II  Anesthesia Plan: General   Post-op Pain Management:    Induction: Intravenous  Airway Management Planned: LMA  Additional Equipment:   Intra-op Plan:   Post-operative Plan:   Informed Consent: I have reviewed the patients History and Physical, chart, labs and discussed the procedure including the risks, benefits and alternatives for the proposed anesthesia with the patient or authorized representative who has indicated his/her understanding and acceptance.   Dental Advisory Given  Plan Discussed with: CRNA and Surgeon  Anesthesia Plan Comments:         Anesthesia Quick Evaluation

## 2012-11-06 NOTE — Op Note (Signed)
PRE-OPERATIVE DIAGNOSIS: anal stricture s/p LAR with coloanal anastamosis  POST-OPERATIVE DIAGNOSIS:  Same  PROCEDURE:  Procedure(s): Exam under anesthesia, anal dilation, proctoscopy  SURGEON:  Surgeon(s): [name], MD  ANESTHESIA:   local and general  DRAINS: none   LOCAL MEDICATIONS USED:  OTHER Exparel, dibucaine  SPECIMEN:  No Specimen  DISPOSITION OF SPECIMEN:  N/A  COUNTS:  YES  DICTATION: .Dragon Dictation  PLAN OF CARE: Discharge to home after PACU  PATIENT DISPOSITION:  PACU - hemodynamically stable.  FINDINGS:  Tight anal stricture, dilated.  PROCEDURE:  Patient was identified in the holding area and taken to the operating room where she was placed supine on the operating room table. General anesthesia was induced. She was then placed into the low lithotomy position. Her perineum was prepped and draped in standard fashion. Timeout was performed according to the surgical safety checklist. When all was correct we continued.  The anus was first dilated with the smallest sounds. I next used my smallest finger.  The anus was progressively dilated up to the largest of the D&C sounds.  The proctoscope was then used to evaluate the pouch. Pouch appeared intact and there is no evidence of dehiscence of the staple line.    It was difficult to evaluate the integrity of the muscular cuff with the patient general anesthesia.  X. For all was then injected circumferentially around the anus. The Gelfoam and dibucaine were then placed in the anal canal. Perineum was dressed with mesh underwear and ABDs. The patient was awakened from anesthesia and taken to PACU in stable condition.

## 2012-11-06 NOTE — Anesthesia Postprocedure Evaluation (Signed)
  Anesthesia Post-op Note  Patient: Renee Nicholson  Procedure(s) Performed: Procedure(s) (LRB): Exam Under Anesthesia , Anal Dilation (N/A)  Patient Location: PACU  Anesthesia Type: General  Level of Consciousness: awake and alert   Airway and Oxygen Therapy: Patient Spontanous Breathing  Post-op Pain: mild  Post-op Assessment: Post-op Vital signs reviewed, Patient's Cardiovascular Status Stable, Respiratory Function Stable, Patent Airway and No signs of Nausea or vomiting  Last Vitals:  Filed Vitals:   11/06/12 1239  BP:   Pulse: 72  Temp: 36.4 C  Resp: 15    Post-op Vital Signs: stable   Complications: No apparent anesthesia complications

## 2012-11-06 NOTE — H&P (View-Only) (Signed)
HISTORY: Pt has been doing a little better overall, but she still has episodes of frequent gas pains, nausea and vomiting.  She is overall having more energy.  She has gained a pound and a half, but remains under 80 pounds.  Her mother-in-law just had cardiac bypass grafting, and she has been doing more around the house.  She tires by the afternoon.     PERTINENT REVIEW OF SYSTEMS: Otherwise negative.     EXAM: Head: Normocephalic and atraumatic.  Eyes:  Conjunctivae are normal. Pupils are equal, round, and reactive to light. No scleral icterus.  Resp: No respiratory distress, normal effort. Abd:  Abdomen is soft, non distended and non tender. No masses are palpable.  There is no rebound and no guarding. Ostomy output fairly watery. Rectal:  Cannot get smallest finger through anal sphincter.Pt has pain with exam.    Neurological: Alert and oriented to person, place, and time. Coordination normal.  Skin: Skin is warm and dry. No rash noted. No diaphoretic. No erythema. No pallor.  Psychiatric: Normal mood and affect. Normal behavior. Judgment and thought content normal.      ASSESSMENT AND PLAN:   Rectal cancer s/p proctocolectomy, j-pouch with ileoanal anastomosis, loop ileostomy on 12/14/11 Pt has not gained any weight but has increased energy compared to previous.  Cannot get smallest finger through anastamosis.    Will plan exam under anesthesia and anal dilation.  After that, will obtain barium enema to evaluate pouch integrity and muscular ability.      Lasalle Abee L Isiaah Cuervo, MD Surgical Oncology, General & Endocrine Surgery [facility]  VYAS,DHRUV B., MD Vyas, Dhruv B., MD   

## 2012-11-08 ENCOUNTER — Encounter (HOSPITAL_COMMUNITY): Payer: Self-pay | Admitting: General Surgery

## 2012-11-14 ENCOUNTER — Other Ambulatory Visit: Payer: BC Managed Care – PPO | Admitting: Lab

## 2012-11-14 ENCOUNTER — Encounter: Payer: Self-pay | Admitting: Oncology

## 2012-11-14 ENCOUNTER — Ambulatory Visit (HOSPITAL_BASED_OUTPATIENT_CLINIC_OR_DEPARTMENT_OTHER): Payer: BC Managed Care – PPO | Admitting: Oncology

## 2012-11-14 ENCOUNTER — Telehealth: Payer: Self-pay | Admitting: Oncology

## 2012-11-14 VITALS — BP 116/80 | HR 67 | Temp 97.4°F | Resp 18 | Ht <= 58 in | Wt 78.2 lb

## 2012-11-14 DIAGNOSIS — C20 Malignant neoplasm of rectum: Secondary | ICD-10-CM

## 2012-11-14 LAB — CBC WITH DIFFERENTIAL/PLATELET
Basophils Absolute: 0 10*3/uL (ref 0.0–0.1)
EOS%: 3.8 % (ref 0.0–7.0)
HCT: 34.6 % — ABNORMAL LOW (ref 34.8–46.6)
HGB: 11.9 g/dL (ref 11.6–15.9)
LYMPH%: 25.9 % (ref 14.0–49.7)
MCH: 29.2 pg (ref 25.1–34.0)
MCV: 84.7 fL (ref 79.5–101.0)
MONO%: 10.6 % (ref 0.0–14.0)
NEUT%: 59.3 % (ref 38.4–76.8)
Platelets: 178 10*3/uL (ref 145–400)

## 2012-11-14 LAB — COMPREHENSIVE METABOLIC PANEL (CC13)
AST: 17 U/L (ref 5–34)
Alkaline Phosphatase: 81 U/L (ref 40–150)
BUN: 14 mg/dL (ref 7.0–26.0)
CO2: 29 mEq/L (ref 22–29)
Calcium: 9.2 mg/dL (ref 8.4–10.4)
Chloride: 103 mEq/L (ref 98–107)
Creatinine: 0.7 mg/dL (ref 0.6–1.1)

## 2012-11-14 NOTE — Patient Instructions (Addendum)
Continue taking super B complex  Take folic acid 1 mg daily  I will see you in 3 months

## 2012-11-14 NOTE — Telephone Encounter (Signed)
gv pt husband appt schedule for march and may. Will check w/KK re if pt needs to keep march appt.

## 2012-11-15 ENCOUNTER — Inpatient Hospital Stay (HOSPITAL_COMMUNITY)
Admission: EM | Admit: 2012-11-15 | Discharge: 2012-11-18 | DRG: 180 | Disposition: A | Discharge status: Home / Self Care | Payer: BC Managed Care – PPO | Attending: General Surgery | Admitting: General Surgery

## 2012-11-15 ENCOUNTER — Emergency Department (HOSPITAL_COMMUNITY): Payer: BC Managed Care – PPO

## 2012-11-15 ENCOUNTER — Encounter (HOSPITAL_COMMUNITY): Payer: Self-pay | Admitting: Emergency Medicine

## 2012-11-15 DIAGNOSIS — Z932 Ileostomy status: Secondary | ICD-10-CM

## 2012-11-15 DIAGNOSIS — Z85038 Personal history of other malignant neoplasm of large intestine: Secondary | ICD-10-CM

## 2012-11-15 DIAGNOSIS — Z85048 Personal history of other malignant neoplasm of rectum, rectosigmoid junction, and anus: Secondary | ICD-10-CM

## 2012-11-15 DIAGNOSIS — E876 Hypokalemia: Secondary | ICD-10-CM | POA: Diagnosis present

## 2012-11-15 DIAGNOSIS — Z9221 Personal history of antineoplastic chemotherapy: Secondary | ICD-10-CM

## 2012-11-15 DIAGNOSIS — K56609 Unspecified intestinal obstruction, unspecified as to partial versus complete obstruction: Principal | ICD-10-CM | POA: Diagnosis present

## 2012-11-15 DIAGNOSIS — K219 Gastro-esophageal reflux disease without esophagitis: Secondary | ICD-10-CM | POA: Diagnosis present

## 2012-11-15 DIAGNOSIS — Z9049 Acquired absence of other specified parts of digestive tract: Secondary | ICD-10-CM

## 2012-11-15 DIAGNOSIS — Z923 Personal history of irradiation: Secondary | ICD-10-CM

## 2012-11-15 DIAGNOSIS — Z79899 Other long term (current) drug therapy: Secondary | ICD-10-CM

## 2012-11-15 DIAGNOSIS — F411 Generalized anxiety disorder: Secondary | ICD-10-CM | POA: Diagnosis present

## 2012-11-15 LAB — URINE MICROSCOPIC-ADD ON

## 2012-11-15 LAB — URINALYSIS, ROUTINE W REFLEX MICROSCOPIC
Glucose, UA: NEGATIVE mg/dL
Specific Gravity, Urine: 1.023 (ref 1.005–1.030)

## 2012-11-15 LAB — CBC WITH DIFFERENTIAL/PLATELET
Basophils Relative: 0 % (ref 0–1)
HCT: 35.1 % — ABNORMAL LOW (ref 36.0–46.0)
Hemoglobin: 12.6 g/dL (ref 12.0–15.0)
Lymphocytes Relative: 6 % — ABNORMAL LOW (ref 12–46)
MCHC: 35.9 g/dL (ref 30.0–36.0)
Monocytes Absolute: 0.4 10*3/uL (ref 0.1–1.0)
Monocytes Relative: 4 % (ref 3–12)
Neutro Abs: 8.9 10*3/uL — ABNORMAL HIGH (ref 1.7–7.7)

## 2012-11-15 MED ORDER — SODIUM CHLORIDE 0.9 % IV BOLUS (SEPSIS)
1000.0000 mL | Freq: Once | INTRAVENOUS | Status: AC
  Administered 2012-11-16: 1000 mL via INTRAVENOUS

## 2012-11-15 MED ORDER — ONDANSETRON HCL 4 MG/2ML IJ SOLN
4.0000 mg | Freq: Once | INTRAMUSCULAR | Status: AC
  Administered 2012-11-16: 4 mg via INTRAVENOUS
  Filled 2012-11-15: qty 2

## 2012-11-15 MED ORDER — SODIUM CHLORIDE 0.9 % IV SOLN
Freq: Once | INTRAVENOUS | Status: AC
  Administered 2012-11-16: 01:00:00 via INTRAVENOUS

## 2012-11-15 MED ORDER — MORPHINE SULFATE 4 MG/ML IJ SOLN
4.0000 mg | Freq: Once | INTRAMUSCULAR | Status: AC
  Administered 2012-11-16: 4 mg via INTRAVENOUS
  Filled 2012-11-15: qty 1

## 2012-11-15 NOTE — ED Notes (Signed)
Pt c/o abd pain, no output into ostomy bag since last night per family. +n/v.

## 2012-11-15 NOTE — ED Notes (Signed)
IV Team Nurse at bedside.  

## 2012-11-16 ENCOUNTER — Encounter (HOSPITAL_COMMUNITY): Payer: Self-pay | Admitting: General Practice

## 2012-11-16 ENCOUNTER — Inpatient Hospital Stay (HOSPITAL_COMMUNITY): Payer: BC Managed Care – PPO

## 2012-11-16 DIAGNOSIS — K56609 Unspecified intestinal obstruction, unspecified as to partial versus complete obstruction: Secondary | ICD-10-CM

## 2012-11-16 LAB — COMPREHENSIVE METABOLIC PANEL
ALT: 34 U/L (ref 0–35)
Albumin: 3.6 g/dL (ref 3.5–5.2)
Alkaline Phosphatase: 82 U/L (ref 39–117)
Potassium: 3 mEq/L — ABNORMAL LOW (ref 3.5–5.1)
Sodium: 135 mEq/L (ref 135–145)
Total Protein: 6.7 g/dL (ref 6.0–8.3)

## 2012-11-16 LAB — BASIC METABOLIC PANEL
BUN: 9 mg/dL (ref 6–23)
CO2: 28 mEq/L (ref 19–32)
Calcium: 8.5 mg/dL (ref 8.4–10.5)
Creatinine, Ser: 0.72 mg/dL (ref 0.50–1.10)
GFR calc non Af Amer: >90 mL/min (ref >90)
Glucose, Bld: 104 mg/dL — ABNORMAL HIGH (ref 70–99)
Sodium: 140 mEq/L (ref 135–145)

## 2012-11-16 MED ORDER — ENOXAPARIN SODIUM 30 MG/0.3ML ~~LOC~~ SOLN
30.0000 mg | SUBCUTANEOUS | Status: DC
  Administered 2012-11-16 – 2012-11-17 (×2): 30 mg via SUBCUTANEOUS
  Filled 2012-11-16 (×4): qty 0.3

## 2012-11-16 MED ORDER — SODIUM CHLORIDE 0.9 % IJ SOLN: 10.0000 mL | INTRAMUSCULAR | Status: DC | PRN

## 2012-11-16 MED ORDER — POTASSIUM CHLORIDE 10 MEQ/100ML IV SOLN
10.0000 meq | INTRAVENOUS | Status: AC
  Administered 2012-11-16 (×3): 10 meq via INTRAVENOUS
  Filled 2012-11-16 (×3): qty 100

## 2012-11-16 MED ORDER — ONDANSETRON HCL 4 MG/2ML IJ SOLN: 4.0000 mg | Freq: Four times a day (QID) | INTRAMUSCULAR | Status: DC | PRN

## 2012-11-16 MED ORDER — KCL-LACTATED RINGERS-D5W 20 MEQ/L IV SOLN
INTRAVENOUS | Status: DC
  Administered 2012-11-16 – 2012-11-17 (×3): via INTRAVENOUS
  Filled 2012-11-16 (×5): qty 1000

## 2012-11-16 MED ORDER — ONDANSETRON HCL 4 MG/2ML IJ SOLN: 4.0000 mg | INTRAMUSCULAR | Status: DC | PRN

## 2012-11-16 MED ORDER — ALTEPLASE 2 MG IJ SOLR
2.0000 mg | Freq: Once | INTRAMUSCULAR | Status: AC
  Administered 2012-11-16: 2 mg
  Filled 2012-11-16: qty 2

## 2012-11-16 MED ORDER — KCL-LACTATED RINGERS-D5W 20 MEQ/L IV SOLN
INTRAVENOUS | Status: DC
  Administered 2012-11-16: 04:00:00 via INTRAVENOUS
  Filled 2012-11-16 (×2): qty 1000

## 2012-11-16 MED ORDER — MORPHINE SULFATE 2 MG/ML IJ SOLN: 2.0000 mg | INTRAMUSCULAR | Status: DC | PRN

## 2012-11-16 NOTE — Progress Notes (Signed)
UR completed 

## 2012-11-16 NOTE — H&P (Signed)
Renee Nicholson is an 47 y.o. female.   Chief Complaint:   Abdominal pain with nausea and vomiting HPI: She is status post proctocolectomy with J-pouch creation and ileoanal anastomosis in March 2013. Two days ago, she ate a large amount of food.  She subsequently developed persistent nausea and vomiting. She also had no output out of her ostomy. She presented to the emergency department last night. X-rays were consistent with small bowel obstruction. We were asked to see her because of this. She had an episode similar to this in December which resolved nonoperatively. She refuses to have a nasogastric tube put in. She now has begun to have ostomy output and is feeling better.  Past Medical History  Diagnosis Date  . Anemia   . Diarrhea   . Rectal cancer 08/16/2011  . Colon cancer   . Anxiety   . Bright red rectal bleeding 09/13/2011  . History of chemotherapy     completed 09/2011   . Hx of radiation therapy 08/29/11 to 10/09/11    rectum  . GERD (gastroesophageal reflux disease)   . Ileostomy in place     Past Surgical History  Procedure Laterality Date  . Tubal ligation    . Carpal tunnel release    . Colonoscopy  07/26/2011    Procedure: COLONOSCOPY;  Surgeon: Arlyce Harman, MD;  Location: [facility];  Service: Endoscopy;  Laterality: N/A;  10:40  . Esophagogastroduodenoscopy  07/26/2011    Procedure: ESOPHAGOGASTRODUODENOSCOPY (EGD);  Surgeon: Arlyce Harman, MD;  [facility]: [facility];  Service: Endoscopy;  Laterality: N/A;  . Esophagogastroduodenoscopy  12/27/2011    Procedure: ESOPHAGOGASTRODUODENOSCOPY (EGD);  Surgeon: Shirley Friar, MD;  Location: [facility];  Service: Endoscopy;  Laterality: N/A;  . Laparoscopic colon resection  12/14/11    diverting ileostomy  . Portacath placement  02/21/2012    Procedure: INSERTION PORT-A-CATH;  Surgeon: [name], MD;  Location: [facility];  Service: General;  Laterality: N/A;  . Evaluation under anesthesia with anal fissurotomy N/A  11/06/2012    Procedure: Exam Under Anesthesia , Anal Dilation;  Surgeon: [name], MD;  Location: [facility];  Service: General;  Laterality: N/A;  Exam Under Anesthesia , Anal Dilation    Family History  Problem Relation Age of Onset  . Diabetes Mother   . Colon cancer Neg Hx   . Cancer Brother 35    rectal cancer lives in [place]   Social History:  reports that she has never smoked. She has never used smokeless tobacco. She reports that she does not drink alcohol or use illicit drugs.  Allergies: No Known Allergies  Medications Prior to Admission  Medication Sig Dispense Refill  . acetaminophen (TYLENOL) 325 MG tablet Take 650 mg by mouth every 6 (six) hours as needed. Headache/pain      . Cyanocobalamin (VITAMIN B-12 ER PO) Take 1 tablet by mouth daily.      . dibucaine (NUPERCAINAL) 1 % ointment Apply topically 3 (three) times daily as needed for pain.  30 g  1  . diphenoxylate-atropine (LOMOTIL) 2.5-0.025 MG per tablet Take 1 tablet by mouth 4 (four) times daily as needed for diarrhea or loose stools.  60 tablet  5  . ferrous sulfate 325 (65 FE) MG tablet Take 325 mg by mouth daily with breakfast.      . folic acid (FOLVITE) 1 MG tablet Take 1 mg by mouth every morning.      Marland Kitchen HYDROcodone-acetaminophen (VICODIN) 5-500 MG per tablet  Take 1-2 tablets by mouth every 6 (six) hours as needed for pain.      Marland Kitchen lactose free nutrition (BOOST) LIQD Take 237 mLs by mouth 2 (two) times daily.      . Multiple Vitamin (MULTIVITAMIN) tablet Take 1 tablet by mouth every morning.       . ondansetron (ZOFRAN) 4 MG tablet Take 4 mg by mouth every 6 (six) hours as needed for nausea.      . pantoprazole (PROTONIX) 40 MG tablet Take 40 mg by mouth every morning.       . potassium chloride SA (K-DUR,KLOR-CON) 20 MEQ tablet Take 20 mEq by mouth every morning.      . Simethicone 125 MG CAPS Take 1 capsule by mouth 2 (two) times daily as needed (for gas or upset stomach).        Results for orders placed  during the hospital encounter of 11/15/12 (from the past 48 hour(s))  URINALYSIS, ROUTINE W REFLEX MICROSCOPIC     Status: Abnormal   Collection Time    11/15/12 10:38 PM      Result Value Range   Color, Urine YELLOW  YELLOW   APPearance CLEAR  CLEAR   Specific Gravity, Urine 1.023  1.005 - 1.030   pH 6.5  5.0 - 8.0   Glucose, UA NEGATIVE  NEGATIVE mg/dL   Hgb urine dipstick MODERATE (*) NEGATIVE   Bilirubin Urine NEGATIVE  NEGATIVE   Ketones, ur >80 (*) NEGATIVE mg/dL   Protein, ur 30 (*) NEGATIVE mg/dL   Urobilinogen, UA 0.2  0.0 - 1.0 mg/dL   Nitrite NEGATIVE  NEGATIVE   Leukocytes, UA SMALL (*) NEGATIVE  URINE MICROSCOPIC-ADD ON     Status: None   Collection Time    11/15/12 10:38 PM      Result Value Range   Squamous Epithelial / LPF RARE  RARE   WBC, UA 3-6  <3 WBC/hpf   RBC / HPF 7-10  <3 RBC/hpf  CBC WITH DIFFERENTIAL     Status: Abnormal   Collection Time    11/15/12 11:25 PM      Result Value Range   WBC 9.9  4.0 - 10.5 K/uL   RBC 4.27  3.87 - 5.11 MIL/uL   Hemoglobin 12.6  12.0 - 15.0 g/dL   HCT 47.8 (*) 29.5 - 62.1 %   MCV 82.2  78.0 - 100.0 fL   MCH 29.5  26.0 - 34.0 pg   MCHC 35.9  30.0 - 36.0 g/dL   RDW 30.8  65.7 - 84.6 %   Platelets 177  150 - 400 K/uL   Neutrophils Relative 89 (*) 43 - 77 %   Neutro Abs 8.9 (*) 1.7 - 7.7 K/uL   Lymphocytes Relative 6 (*) 12 - 46 %   Lymphs Abs 0.6 (*) 0.7 - 4.0 K/uL   Monocytes Relative 4  3 - 12 %   Monocytes Absolute 0.4  0.1 - 1.0 K/uL   Eosinophils Relative 0  0 - 5 %   Eosinophils Absolute 0.0  0.0 - 0.7 K/uL   Basophils Relative 0  0 - 1 %   Basophils Absolute 0.0  0.0 - 0.1 K/uL  LIPASE, BLOOD     Status: None   Collection Time    11/15/12 11:25 PM      Result Value Range   Lipase 37  11 - 59 U/L  COMPREHENSIVE METABOLIC PANEL     Status: Abnormal   Collection Time  11/15/12 11:25 PM      Result Value Range   Sodium 135  135 - 145 mEq/L   Potassium 3.0 (*) 3.5 - 5.1 mEq/L   Chloride 93 (*) 96 - 112  mEq/L   CO2 29  19 - 32 mEq/L   Glucose, Bld 126 (*) 70 - 99 mg/dL   BUN 14  6 - 23 mg/dL   Creatinine, Ser 9.56  0.50 - 1.10 mg/dL   Calcium 8.9  8.4 - 21.3 mg/dL   Total Protein 6.7  6.0 - 8.3 g/dL   Albumin 3.6  3.5 - 5.2 g/dL   AST 39 (*) 0 - 37 U/L   ALT 34  0 - 35 U/L   Alkaline Phosphatase 82  39 - 117 U/L   Total Bilirubin 0.9  0.3 - 1.2 mg/dL   GFR calc non Af Amer >90  >90 mL/min   GFR calc Af Amer >90  >90 mL/min   Comment:            The eGFR has been calculated     using the CKD EPI equation.     This calculation has not been     validated in all clinical     situations.     eGFR's persistently     <90 mL/min signify     possible Chronic Kidney Disease.   Dg Abd Acute W/chest  11/16/2012  *RADIOLOGY REPORT*  Clinical Data: Nausea, vomiting, right-sided abdominal pain.  No output from ostomy.  History of colon cancer.  ACUTE ABDOMEN SERIES (ABDOMEN 2 VIEW & CHEST 1 VIEW)  Comparison: Chest 02/15/2012.  Abdomen, 08/03/2012  Findings: Since the previous study, there has been interval insertion of a power port type central venous catheter with tip over the mid SVC region.  No pneumothorax. The heart size and pulmonary vascularity are normal. The lungs appear clear and expanded without focal air space disease or consolidation. No blunting of the costophrenic angles.  Gaseous distension of mid abdominal small bowel with air-fluid levels suggesting small bowel obstruction.  No free intra-abdominal air.  Stomach appears mildly distended with gas and fluid.  No radiopaque stones.  Postoperative changes in the pelvis. Visualized bones appear intact.  IMPRESSION: No evidence of active pulmonary disease.  Gas distended small bowel suggesting obstruction.   Original Report Authenticated By: Burman Nieves, M.D.     Review of Systems  Constitutional: Negative for fever and chills.  Respiratory: Negative for shortness of breath.   Cardiovascular: Negative for chest pain.   Gastrointestinal: Negative for blood in stool.  Genitourinary: Negative for dysuria and hematuria.  Neurological: Negative for focal weakness.    Blood pressure 117/75, pulse 77, temperature 98.5 F (36.9 C), temperature source Oral, resp. rate 14, height 4\' 5"  (1.346 m), weight 76 lb 0.9 oz (34.5 kg), last menstrual period 02/20/2012, SpO2 99.00%. Physical Exam  Constitutional: No distress.  Thin female.  HENT:  Head: Normocephalic and atraumatic.  Neck: Neck supple.  Cardiovascular: Normal rate and regular rhythm.   Respiratory: Effort normal and breath sounds normal.  Port-A-Cath in place.  GI: Soft. She exhibits no distension and no mass. There is no tenderness.  Left-sided ileostomy with a small amount of stool output. She states the nurse just emptied a large amount of stool.  Musculoskeletal: She exhibits no edema.  Lymphadenopathy:    She has no cervical adenopathy.  Neurological: She is alert.  Skin: Skin is warm and dry.     Assessment/Plan Partial  small bowel obstruction that is improving. This is likely secondary to her eating too much bulk at one time. She currently is feeling better.  She also has hypokalemia and replacement has been ordered.  Plan: Repeat abdominal x-rays. If these show improvement, will start her on a clear liquid diet.  Mako Pelfrey J 11/16/2012, 7:10 AM

## 2012-11-16 NOTE — ED Provider Notes (Signed)
History     CSN: [id number]  Arrival date & time 11/15/12  2041   First MD Initiated Contact with Patient 11/15/12 2302      Chief Complaint  Patient presents with  . Abdominal Pain    (Consider location/radiation/quality/duration/timing/severity/associated sxs/prior treatment) HPI 47-year-old female presents emergency apartment from home with complaint of 2 days of nausea and vomiting and no output from her ostomy bag. Patient has history of colorectal cancer status post resection with ileostomy. She has history of small bowel obstruction in December. She reports her symptoms are similar to that episode. She's been taking her Zofran at home without improvement and vomiting. Patient was seen recently by both her surgeon and her oncologist. She denies any fevers. No change in her diet. She reports she feels very weak. She has intermittent waves of crampy type pain  Past Medical History  Diagnosis Date  . Anemia   . Diarrhea   . Rectal cancer 08/16/2011  . Colon cancer   . Anxiety   . Bright red rectal bleeding 09/13/2011  . History of chemotherapy     completed 09/2011   . Hx of radiation therapy 08/29/11 to 10/09/11    rectum  . GERD (gastroesophageal reflux disease)   . Ileostomy in place     Past Surgical History  Procedure Laterality Date  . Tubal ligation    . Carpal tunnel release    . Colonoscopy  07/26/2011    Procedure: COLONOSCOPY;  Surgeon: Arlyce Harman, MD;  [facility]: [facility];  Service: Endoscopy;  Laterality: N/A;  10:40  . Esophagogastroduodenoscopy  07/26/2011    Procedure: ESOPHAGOGASTRODUODENOSCOPY (EGD);  Surgeon: Arlyce Harman, MD;  Location: [facility];  Service: Endoscopy;  Laterality: N/A;  . Esophagogastroduodenoscopy  12/27/2011    Procedure: ESOPHAGOGASTRODUODENOSCOPY (EGD);  Surgeon: Shirley Friar, MD;  Location: [facility];  Service: Endoscopy;  Laterality: N/A;  . Laparoscopic colon resection  12/14/11    diverting ileostomy  .  Portacath placement  02/21/2012    Procedure: INSERTION PORT-A-CATH;  Surgeon: [name], MD;  Location: [facility];  Service: General;  Laterality: N/A;  . Evaluation under anesthesia with anal fissurotomy N/A 11/06/2012    Procedure: Exam Under Anesthesia , Anal Dilation;  Surgeon: [name], MD;  Location: [facility];  Service: General;  Laterality: N/A;  Exam Under Anesthesia , Anal Dilation    Family History  Problem Relation Age of Onset  . Diabetes Mother   . Colon cancer Neg Hx   . Cancer Brother 35    rectal cancer lives in [place]    History  Substance Use Topics  . Smoking status: Never Smoker   . Smokeless tobacco: Never Used  . Alcohol Use: No    OB History   Grav Para Term Preterm Abortions TAB SAB Ect Mult Living                  Review of Systems  All other systems reviewed and are negative.    Allergies  Review of patient's allergies indicates no known allergies.  Home Medications  No current outpatient prescriptions on file.  BP 117/75  Pulse 77  Temp(Src) 98.5 F (36.9 C) (Oral)  Resp 14  Ht 4\' 5"  (1.346 m)  Wt 76 lb 0.9 oz (34.5 kg)  BMI 19.04 kg/m2  SpO2 99%  LMP 02/20/2012  Physical Exam  Constitutional: She is oriented to person, place, and time.  Thin female, uncomfortable appearing  HENT:  Head:  Normocephalic and atraumatic.  Dry mucous membranes  Eyes: Conjunctivae and EOM are normal.  Neck: Normal range of motion. Neck supple. No JVD present. No tracheal deviation present. No thyromegaly present.  Cardiovascular: Normal rate, regular rhythm, normal heart sounds and intact distal pulses.  Exam reveals no gallop and no friction rub.   No murmur heard. Pulmonary/Chest: Effort normal and breath sounds normal. No stridor. No respiratory distress. She has no wheezes. She has no rales. She exhibits no tenderness.  Abdominal: Soft. She exhibits no mass. There is tenderness. There is no rebound and no guarding.  No bowel sounds auscultated.  There is fluid in the ostomy bag, but no stool. Ostomy is pink. Surrounding skin is healthy appearing  Musculoskeletal: Normal range of motion. She exhibits no edema and no tenderness.  Lymphadenopathy:    She has no cervical adenopathy.  Neurological: She is alert and oriented to person, place, and time. She displays normal reflexes. She exhibits normal muscle tone. Coordination normal.  Skin: Skin is warm and dry. No rash noted. No erythema. No pallor.    ED Course  Procedures (including critical care time)  Labs Reviewed  CBC WITH DIFFERENTIAL - Abnormal; Notable for the following:    HCT 35.1 (*)    Neutrophils Relative 89 (*)    Neutro Abs 8.9 (*)    Lymphocytes Relative 6 (*)    Lymphs Abs 0.6 (*)    All other components within normal limits  URINALYSIS, ROUTINE W REFLEX MICROSCOPIC - Abnormal; Notable for the following:    Hgb urine dipstick MODERATE (*)    Ketones, ur >80 (*)    Protein, ur 30 (*)    Leukocytes, UA SMALL (*)    All other components within normal limits  COMPREHENSIVE METABOLIC PANEL - Abnormal; Notable for the following:    Potassium 3.0 (*)    Chloride 93 (*)    Glucose, Bld 126 (*)    AST 39 (*)    All other components within normal limits  LIPASE, BLOOD  URINE MICROSCOPIC-ADD ON  BASIC METABOLIC PANEL   Dg Abd Acute W/chest  11/16/2012  *RADIOLOGY REPORT*  Clinical Data: Nausea, vomiting, right-sided abdominal pain.  No output from ostomy.  History of colon cancer.  ACUTE ABDOMEN SERIES (ABDOMEN 2 VIEW & CHEST 1 VIEW)  Comparison: Chest 02/15/2012.  Abdomen, 08/03/2012  Findings: Since the previous study, there has been interval insertion of a power port type central venous catheter with tip over the mid SVC region.  No pneumothorax. The heart size and pulmonary vascularity are normal. The lungs appear clear and expanded without focal air space disease or consolidation. No blunting of the costophrenic angles.  Gaseous distension of mid abdominal small  bowel with air-fluid levels suggesting small bowel obstruction.  No free intra-abdominal air.  Stomach appears mildly distended with gas and fluid.  No radiopaque stones.  Postoperative changes in the pelvis. Visualized bones appear intact.  IMPRESSION: No evidence of active pulmonary disease.  Gas distended small bowel suggesting obstruction.   Original Report Authenticated By: Burman Nieves, M.D.      1. Small bowel obstruction       MDM  47 year old female with nausea and vomiting and no ostomy output. Patient has history of small bowel obstruction in the past, is concerned for repeat at this time. Abdomen is not distended. But there is no bowel sounds auscultated. Acute abdominal series shows dilated loops of bowel with air-fluid levels. Discussed case with Dr. Purnell Shoemaker, on-call for  general surgery. He requests admission to medical surgical bed, replacement of potassium, and treatment for pain and nausea. He will see the patient in the morning.        Olivia Mackie, MD 11/16/12 [phone]

## 2012-11-17 DIAGNOSIS — K56609 Unspecified intestinal obstruction, unspecified as to partial versus complete obstruction: Secondary | ICD-10-CM | POA: Diagnosis present

## 2012-11-17 DIAGNOSIS — E876 Hypokalemia: Secondary | ICD-10-CM | POA: Diagnosis present

## 2012-11-17 MED ORDER — BOOST PLUS PO LIQD
237.0000 mL | Freq: Three times a day (TID) | ORAL | Status: DC
  Administered 2012-11-17: 237 mL via ORAL
  Filled 2012-11-17 (×5): qty 237

## 2012-11-17 NOTE — Progress Notes (Signed)
INITIAL NUTRITION ASSESSMENT  DOCUMENTATION CODES Per approved criteria  -Severe malnutrition in the context of chronic illness -Underweight   INTERVENTION: Boost bid Encouraged intake.  Small frequent meals.  NUTRITION DIAGNOSIS: Inadequate oral intake related to altered GI function as evidenced by reported intake.   Goal: Intake of >75% meals and supplements to meet estimated needs.  Monitor:  Intake, diet tolerance and advancement, labs, weight trend  Reason for Assessment: MST  47 y.o. female  Admitting Dx: SBO (small bowel obstruction)  ASSESSMENT: Patient with resolving small bowel obstruction.  Ileostomy is beginning to work.  Patient follows a vegetarian diet with usual weight of 95 lbs 2 years. Ago.  Weight has continued to decrease with continued treatments for cancer.  Patient reports drinking Boost 2 x per day and eating 4-5 small meals daily.  Height: Ht Readings from Last 1 Encounters:  11/16/12 4\' 5"  (1.346 m)    Weight: Wt Readings from Last 1 Encounters:  11/16/12 76 lb 0.9 oz (34.5 kg)    Ideal Body Weight: 43.1 kg % Ideal Body Weight: 80  Wt Readings from Last 10 Encounters:  11/16/12 76 lb 0.9 oz (34.5 kg)  11/14/12 78 lb 3.2 oz (35.471 kg)  10/30/12 79 lb (35.834 kg)  10/18/12 77 lb 12.8 oz (35.29 kg)  10/17/12 78 lb 8 oz (35.607 kg)  09/20/12 77 lb 3.2 oz (35.018 kg)  08/27/12 79 lb 6.4 oz (36.016 kg)  08/16/12 79 lb 9.6 oz (36.106 kg)  08/02/12 78 lb 11.3 oz (35.7 kg)  08/02/12 78 lb 12.8 oz (35.743 kg)    Usual Body Weight: 95 lbs  % Usual Body Weight: 80  BMI:  Body mass index is 19.04 kg/(m^2).  Estimated Nutritional Needs: Kcal: 1200-1400 Protein: 55-65 gm Fluid: >1.5L  Skin: intact  Diet Order: Full Liquid  EDUCATION NEEDS: -Education needs addressed- small frequent meals, supplements to maintain or gain weight.   Intake/Output Summary (Last 24 hours) at 11/17/12 1130 Last data filed at 11/17/12 1008  Gross per 24  hour  Intake   2635 ml  Output   1750 ml  Net    885 ml     Labs:   Recent Labs Lab 11/14/12 1112 11/15/12 2325 11/16/12 1256  NA 140 135 140  K 3.5 3.0* 3.6  CL 103 93* 104  CO2 29 29 28   BUN 14.0 14 9  CREATININE 0.7 0.63 0.72  CALCIUM 9.2 8.9 8.5  GLUCOSE 112* 126* 104*    CBG (last 3)  No results found for this basename: GLUCAP,  in the last 72 hours  Scheduled Meds: . enoxaparin (LOVENOX) injection  30 mg Subcutaneous Q24H    Continuous Infusions: . dextrose 5% lactated ringers with KCl 20 mEq/L 75 mL/hr at 11/17/12 1610    Past Medical History  Diagnosis Date  . Anemia   . Diarrhea   . Rectal cancer 08/16/2011  . Colon cancer   . Anxiety   . Bright red rectal bleeding 09/13/2011  . History of chemotherapy     completed 09/2011   . Hx of radiation therapy 08/29/11 to 10/09/11    rectum  . GERD (gastroesophageal reflux disease)   . Ileostomy in place     Past Surgical History  Procedure Laterality Date  . Tubal ligation    . Carpal tunnel release    . Colonoscopy  07/26/2011    Procedure: COLONOSCOPY;  Surgeon: Arlyce Harman, MD;  Location: [facility];  Service: Endoscopy;  Laterality: N/A;  10:40  . Esophagogastroduodenoscopy  07/26/2011    Procedure: ESOPHAGOGASTRODUODENOSCOPY (EGD);  Surgeon: Arlyce Harman, MD;  Location: [facility];  Service: Endoscopy;  Laterality: N/A;  . Esophagogastroduodenoscopy  12/27/2011    Procedure: ESOPHAGOGASTRODUODENOSCOPY (EGD);  Surgeon: Shirley Friar, MD;  Location: [facility];  Service: Endoscopy;  Laterality: N/A;  . Laparoscopic colon resection  12/14/11    diverting ileostomy  . Portacath placement  02/21/2012    Procedure: INSERTION PORT-A-CATH;  Surgeon: [name], MD;  Location: [facility];  Service: General;  Laterality: N/A;  . Evaluation under anesthesia with anal fissurotomy N/A 11/06/2012    Procedure: Exam Under Anesthesia , Anal Dilation;  Surgeon: [name], MD;  Location: [facility];   Service: General;  Laterality: N/A;  Exam Under Anesthesia , Anal Dilation    Oran Rein, RD, LDN Clinical Inpatient Dietitian Pager:  [phone] Weekend and after hours pager:  [phone]

## 2012-11-17 NOTE — Progress Notes (Signed)
Subjective: No abdominal pain. No further nausea or vomiting. Ostomy working well.  Objective: Vital signs in last 24 hours: Temp:  [97.4 F (36.3 C)-98.1 F (36.7 C)] 97.8 F (36.6 C) (02/23 0611) Pulse Rate:  [53-65] 62 (02/23 0611) Resp:  [16-18] 18 (02/23 0611) BP: (99-114)/(56-86) 99/62 mmHg (02/23 0611) SpO2:  [95 %-100 %] 100 % (02/23 0611) Last BM Date: 12-10-2012  Intake/Output from previous day: 12/10/22 0701 - 02/23 0700 In: 3061.3 [P.O.:120; I.V.:2941.3] Out: 1551 [Urine:1151; Stool:400] Intake/Output this shift: Total I/O In: -  Out: 300 [Urine:300]  PE: General- In NAD Abdomen-soft, nontender, nondistended, liquid stool in ostomy bag.  Lab Results:   Recent Labs  11/14/12 1112 11/15/12 2325  WBC 3.6* 9.9  HGB 11.9 12.6  HCT 34.6* 35.1*  PLT 178 177   BMET  Recent Labs  11/15/12 2325 12-10-12 1256  NA 135 140  K 3.0* 3.6  CL 93* 104  CO2 29 28  GLUCOSE 126* 104*  BUN 14 9  CREATININE 0.63 0.72  CALCIUM 8.9 8.5   PT/INR No results found for this basename: LABPROT, INR,  in the last 72 hours Comprehensive Metabolic Panel:    Component Value Date/Time   NA 140 12-10-2012 1256   NA 140 11/14/2012 1112   K 3.6 2012-12-10 1256   K 3.5 11/14/2012 1112   CL 104 2012/12/10 1256   CL 103 11/14/2012 1112   CO2 28 December 10, 2012 1256   CO2 29 11/14/2012 1112   BUN 9 Dec 10, 2012 1256   BUN 14.0 11/14/2012 1112   CREATININE 0.72 12-10-2012 1256   CREATININE 0.7 11/14/2012 1112   CREATININE 0.70 07/27/2011 1306   GLUCOSE 104* 12/10/12 1256   GLUCOSE 112* 11/14/2012 1112   CALCIUM 8.5 Dec 10, 2012 1256   CALCIUM 9.2 11/14/2012 1112   AST 39* 11/15/2012 2325   AST 17 11/14/2012 1112   ALT 34 11/15/2012 2325   ALT 19 11/14/2012 1112   ALKPHOS 82 11/15/2012 2325   ALKPHOS 81 11/14/2012 1112   BILITOT 0.9 11/15/2012 2325   BILITOT 0.61 11/14/2012 1112   PROT 6.7 11/15/2012 2325   PROT 6.5 11/14/2012 1112   ALBUMIN 3.6 11/15/2012 2325   ALBUMIN 3.4* 11/14/2012 1112      Studies/Results: Dg Abd 2 Views  December 10, 2012  *RADIOLOGY REPORT*  Clinical Data: Small bowel obstruction  ABDOMEN - 2 VIEW  Comparison: Dec 10, 2012  Findings: Again noted gaseous distended small bowel loops in the lower abdomen with slight improvement from prior exam.  IMPRESSION: Again noted gaseous distended small bowel loops in lower abdomen with slight improvement from prior exam.  Findings consistent with slight improvement of bowel obstruction.   Original Report Authenticated By: Natasha Mead, M.D.    Dg Abd Acute W/chest  Dec 10, 2012  *RADIOLOGY REPORT*  Clinical Data: Nausea, vomiting, right-sided abdominal pain.  No output from ostomy.  History of colon cancer.  ACUTE ABDOMEN SERIES (ABDOMEN 2 VIEW & CHEST 1 VIEW)  Comparison: Chest 02/15/2012.  Abdomen, 08/03/2012  Findings: Since the previous study, there has been interval insertion of a power port type central venous catheter with tip over the mid SVC region.  No pneumothorax. The heart size and pulmonary vascularity are normal. The lungs appear clear and expanded without focal air space disease or consolidation. No blunting of the costophrenic angles.  Gaseous distension of mid abdominal small bowel with air-fluid levels suggesting small bowel obstruction.  No free intra-abdominal air.  Stomach appears mildly distended with gas and fluid.  No radiopaque  stones.  Postoperative changes in the pelvis. Visualized bones appear intact.  IMPRESSION: No evidence of active pulmonary disease.  Gas distended small bowel suggesting obstruction.   Original Report Authenticated By: Burman Nieves, M.D.     Anti-infectives: Anti-infectives   None      Assessment Principal Problem:   SBO (small bowel obstruction)-resolving. Active Problems:   Rectal cancer s/p proctocolectomy, j-pouch with ileoanal anastomosis, loop ileostomy on 12/14/11   Hypokalemia-results.    LOS: 2 days   Plan:  Start full liquid diet. Ambulate.   Cate Oravec  J 11/17/2012

## 2012-11-18 NOTE — Discharge Summary (Signed)
Physician Discharge Summary  Patient ID: Renee Nicholson MRN: [id number] DOB/AGE: 1966-01-28 47 y.o.  Admit date: 11/15/2012 Discharge date: 11/18/2012  Admission Diagnoses: SBO  Discharge Diagnoses:  Principal Problem:   SBO (small bowel obstruction) Active Problems:   Rectal cancer s/p proctocolectomy, j-pouch with ileoanal anastomosis, loop ileostomy on 12/14/11   Hypokalemia   Discharged Condition: stable  Hospital Course:  Patient was admitted to the hospital with nausea and vomiting. X-rays were significant for small bowel dilation. She refused an NG tube and the obstruction passed quickly. She developed flatus and stool in her ostomy pouch.  Her diet was able to be advanced. She was discharged home in stable condition.  Consults: None  Significant Diagnostic Studies: radiology: KUB: SBO  Treatments: IV hydration and bowel rest  Discharge Exam: Blood pressure 113/77, pulse 58, temperature 97.5 F (36.4 C), temperature source Oral, resp. rate 18, height 4\' 5"  (1.346 m), weight 76 lb 0.9 oz (34.5 kg), last menstrual period 02/20/2012, SpO2 100.00%. General appearance: alert, cooperative and no distress Resp: breathing comfortably GI: soft, non-tender; bowel sounds normal; no masses,  no organomegaly Extremities: extremities normal, atraumatic, no cyanosis or edema  Disposition: 01-Home or Self Care  Discharge Orders   Future Appointments Provider Department Dept Phone   11/22/2012 12:00 PM [name], MD [facility], [place] [phone]   12/12/2012 10:30 AM Krista [facility] MEDICAL ONCOLOGY [phone]   12/12/2012 11:00 AM Victorino December, MD [facility] MEDICAL ONCOLOGY [phone]   02/10/2013 11:30 AM Krista [facility] MEDICAL ONCOLOGY [phone]   02/10/2013 12:00 PM Victorino December, MD [facility] MEDICAL ONCOLOGY [phone]   Future Orders Complete By Expires     Diet - low  sodium heart healthy  As directed     Increase activity slowly  As directed         Medication List    TAKE these medications       acetaminophen 325 MG tablet  Commonly known as:  TYLENOL  Take 650 mg by mouth every 6 (six) hours as needed. Headache/pain     dibucaine 1 % ointment  Commonly known as:  NUPERCAINAL  Apply topically 3 (three) times daily as needed for pain.     diphenoxylate-atropine 2.5-0.025 MG per tablet  Commonly known as:  LOMOTIL  Take 1 tablet by mouth 4 (four) times daily as needed for diarrhea or loose stools.     ferrous sulfate 325 (65 FE) MG tablet  Take 325 mg by mouth daily with breakfast.     folic acid 1 MG tablet  Commonly known as:  FOLVITE  Take 1 mg by mouth every morning.     HYDROcodone-acetaminophen 5-500 MG per tablet  Commonly known as:  VICODIN  Take 1-2 tablets by mouth every 6 (six) hours as needed for pain.     lactose free nutrition Liqd  Take 237 mLs by mouth 2 (two) times daily.     multivitamin tablet  Take 1 tablet by mouth every morning.     ondansetron 4 MG tablet  Commonly known as:  ZOFRAN  Take 4 mg by mouth every 6 (six) hours as needed for nausea.     pantoprazole 40 MG tablet  Commonly known as:  PROTONIX  Take 40 mg by mouth every morning.     potassium chloride SA 20 MEQ tablet  Commonly known as:  K-DUR,KLOR-CON  Take 20 mEq by mouth  every morning.     Simethicone 125 MG Caps  Take 1 capsule by mouth 2 (two) times daily as needed (for gas or upset stomach).     VITAMIN B-12 ER PO  Take 1 tablet by mouth daily.           Follow-up Information   Follow up with [facility], MD On 11/22/2012.   Contact information:   [address] [place] [address] [phone]       Signed: [name] 11/18/2012, 7:51 AM

## 2012-11-22 ENCOUNTER — Ambulatory Visit (INDEPENDENT_AMBULATORY_CARE_PROVIDER_SITE_OTHER): Payer: BC Managed Care – PPO | Admitting: General Surgery

## 2012-11-22 ENCOUNTER — Encounter (INDEPENDENT_AMBULATORY_CARE_PROVIDER_SITE_OTHER): Payer: Self-pay | Admitting: General Surgery

## 2012-11-22 VITALS — BP 104/68 | HR 72 | Temp 97.8°F | Resp 16 | Ht <= 58 in | Wt 76.4 lb

## 2012-11-22 DIAGNOSIS — C20 Malignant neoplasm of rectum: Secondary | ICD-10-CM

## 2012-11-22 NOTE — Progress Notes (Addendum)
HISTORY: Patient is a 47 year old female who is status post laparoscopic total abdominal colectomy, ileal pouch anal anastomosis, and diverting ileostomy. She has had issues gaining weight. She has had to be admitted several times for small bowel obstruction. She developed a stricture at her anus and I took her to the operating room 2 weeks ago for anal dilation.  She denies any nausea or vomiting this week. She continues to have issues with gas pain when she eats large meals.    EXAM: General:  Alert and oriented Incision:  Healing well.   Rectal exam:  Weak muscle tone.  Able to tolerate digital exam with 5th finger and index finger.    PATHOLOGY: n/a   ASSESSMENT AND PLAN:   Rectal cancer s/p proctocolectomy, j-pouch with ileoanal anastomosis, loop ileostomy on 12/14/11 Feeling better after anal dilation. Small bowel obstruction seems to have resolved.    Will get pouchogram to eval for leak/muscle tone.  Will likely need manometry and biofeedback to strengthen muscles.        Maudry Diego, MD Surgical Oncology, General & Endocrine Surgery [facility]  [name], MD [name], MD

## 2012-11-22 NOTE — Assessment & Plan Note (Signed)
Feeling better after anal dilation. Small bowel obstruction seems to have resolved.    Will get pouchogram to eval for leak/muscle tone.  Will likely need manometry and biofeedback to strengthen muscles.

## 2012-11-22 NOTE — Patient Instructions (Addendum)
Will order gastrograffin enema to evaluate pouch for leaks.  Will likely order anal manometry and PT to evaluate muscle tone and get muscles around anus stronger.

## 2012-11-24 NOTE — Progress Notes (Signed)
OFFICE PROGRESS NOTE  CC: Renee Eva, MD [name], MD [name], MD [name], MD [address] [place] [address] [name], MD  DIAGNOSIS: 47 year old female with new diagnosis of rectal carcinoma by rectal ultrasound she was found to have a T3 N1 lesion.  PRIOR THERAPY:   #1 patient presented with a several month history of diarrhea and then subsequent bleeding. She was found to be anemic with a hemoglobin of 10. Because of this she went on to have a colonoscopy performed. The colonoscopy showed multiple sessile polyps in the cecum ascending transverse and sigmoid colon as well as the rectum. The rectal area showed a large exophytic rectal mass approximately 1 cm above the dentate line measuring 3-4 cm. She had a biopsy of this performed and was found to be an adenocarcinoma consistent with a rectal primary.  #2 patient was seen by Dr. [name] at [facility] who performed a rectal ultrasound. The staging clinically was T3 N1. It was recommended by Dr. [name] the patient undergo neoadjuvant chemotherapy and radiation higher to her definitive surgery.  #3 Patient has completed concurrent radiation and chemotherapy. Her chemotherapy consisted of single agent xeloda administrated 08/22/11 - 10/09/2011. Overall she tolerated her treatment very well  #4 Has completed all of neoadjuvant treatment And the PET scan shows no evidence of distant metastasis and a para rectal peritoneal nodes less impressive she has had significant response to therapy.  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 APC is negative  #6 patient had a laparotomy with proctocolectomy, J-pouch with ileoanal anastomosis, loop ileostomy on 12/14/2011. The final pathology did not reveal any evidence of residual disease. Patient had a  complicated postop course including high output ileostomy. She required intensive IV fluids on an outpatient basis to prevent dehydration.  #7 patient began adjuvant chemotherapy consisting of XELOX starting 03/05/2012 on a 21 day cycle. A Total of 6 cycles were planned unfortunate patient was not able to tolerate this therapy due to side effects from the xeloda With development of hand-foot syndrome.She was not able to tolerate oxaliplatin due to significant toxicity from cold sensitivity. In that this treatment is being discontinued.  #8 patient subsequently began adjuvant FOLFIRI beginning on 06/04/2012. She is planned for 4 cycles. She  completed all her chemotherapy on 07/16/12  CURRENT THERAPY: Observation  INTERVAL HISTORY: Renee Nicholson 46 y.o. female returns for followup visit today. She's doing well today.  She continues to have stool output in her ostomy bag.  She's had no further obstruction or pain.  She has no dysuria, or hematuria, no abd/flank pain.  No diarrhea, fevers, chills.  Otherwise she's doing well and a 10 point ROS is neg.   MEDICAL HISTORY: Past Medical History  Diagnosis Date  . Anemia   . Diarrhea   . Rectal cancer 08/16/2011  . Colon cancer   . Anxiety   . Bright red rectal bleeding 09/13/2011  . History of chemotherapy     completed 09/2011   . Hx of radiation therapy 08/29/11 to 10/09/11    rectum  . GERD (gastroesophageal reflux disease)   . Ileostomy in place     ALLERGIES:  has No Known Allergies.  MEDICATIONS:  Current Outpatient Prescriptions  Medication Sig Dispense Refill  . acetaminophen (TYLENOL) 325 MG tablet Take 650 mg by mouth every  6 (six) hours as needed. Headache/pain      . Cyanocobalamin (VITAMIN B-12 ER PO) Take 1 tablet by mouth daily.      . diphenoxylate-atropine (LOMOTIL) 2.5-0.025 MG per tablet Take 1 tablet by mouth 4 (four) times daily as needed for diarrhea or loose stools.  60 tablet  5  . Multiple Vitamin (MULTIVITAMIN)  tablet Take 1 tablet by mouth every morning.       . pantoprazole (PROTONIX) 40 MG tablet Take 40 mg by mouth every morning.       . dibucaine (NUPERCAINAL) 1 % ointment Apply topically 3 (three) times daily as needed for pain.  30 g  1  . ferrous sulfate 325 (65 FE) MG tablet Take 325 mg by mouth daily with breakfast.      . folic acid (FOLVITE) 1 MG tablet Take 1 mg by mouth every morning.      . lactose free nutrition (BOOST) LIQD Take 237 mLs by mouth 2 (two) times daily.      . ondansetron (ZOFRAN) 4 MG tablet Take 4 mg by mouth every 6 (six) hours as needed for nausea.      . potassium chloride SA (K-DUR,KLOR-CON) 20 MEQ tablet Take 20 mEq by mouth every morning.      . Simethicone 125 MG CAPS Take 1 capsule by mouth 2 (two) times daily as needed (for gas or upset stomach).       No current facility-administered medications for this visit.   Facility-Administered Medications Ordered in Other Visits  Medication Dose Route Frequency [name] Last Rate Last Dose  . heparin lock flush 100 unit/mL  500 Units Intravenous Once Victorino December, MD      . sodium chloride 0.9 % injection 10 mL  10 mL Intravenous PRN Victorino December, MD        SURGICAL HISTORY:  Past Surgical History  Procedure Laterality Date  . Tubal ligation    . Carpal tunnel release    . Colonoscopy  07/26/2011    Procedure: COLONOSCOPY;  Surgeon: Arlyce Harman, MD;  Location: [facility];  Service: Endoscopy;  Laterality: N/A;  10:40  . Esophagogastroduodenoscopy  07/26/2011    Procedure: ESOPHAGOGASTRODUODENOSCOPY (EGD);  Surgeon: Arlyce Harman, MD;  Location: [facility];  Service: Endoscopy;  Laterality: N/A;  . Esophagogastroduodenoscopy  12/27/2011    Procedure: ESOPHAGOGASTRODUODENOSCOPY (EGD);  Surgeon: Shirley Friar, MD;  Location: [facility];  Service: Endoscopy;  Laterality: N/A;  . Laparoscopic colon resection  12/14/11    diverting ileostomy  . Portacath placement  02/21/2012    Procedure:  INSERTION PORT-A-CATH;  Surgeon: [name], MD;  Location: [facility] OR;  Service: General;  Laterality: N/A;  . Evaluation under anesthesia with anal fissurotomy N/A 11/06/2012    Procedure: Exam Under Anesthesia , Anal Dilation;  Surgeon: [name], MD;  Location: [facility] ORS;  Service: General;  Laterality: N/A;  Exam Under Anesthesia , Anal Dilation    REVIEW OF SYSTEMS:   General: fatigue (+), night sweats (-), fever (-), pain (-) Lymph: palpable nodes (-) HEENT: vision changes (-), mucositis (-), gum bleeding (-), epistaxis (-) Cardiovascular: chest pain (-), palpitations (-) Pulmonary: shortness of breath (-), dyspnea on exertion (-), cough (-), hemoptysis (-) GI:  Early satiety (-), melena (-), dysphagia (-), nausea/vomiting (-), diarrhea (-) GU: dysuria (-), hematuria (-), incontinence (-) Musculoskeletal: joint swelling (-), joint pain (-), back pain (-) Neuro: weakness (-), numbness (-), headache (-), confusion (-) Skin: Rash (-),  lesions (-), dryness (-) Psych: depression (-), suicidal/homicidal ideation (-), feeling of hopelessness (-)   PHYSICAL EXAMINATION:  BP 116/80  Pulse 67  Temp(Src) 97.4 F (36.3 C) (Oral)  Resp 18  Ht 4\' 5"  (1.346 m)  Wt 78 lb 3.2 oz (35.471 kg)  BMI 19.58 kg/m2  LMP 02/20/2012 Gen.: Patient is a thin female but in no acute distress HEENT: PERRLA sclerae anicteric no conjunctival pallor oral mucosa is moist no thrush Neck: supple, no palpable adenopathy Lungs: clear to auscultation, no wheezes, rhonchi, or rales Cardiovascular: regular rate rhythm, S1, S2, no murmurs, rubs or gallops Abdomen: Soft, non-tender, non-distended, normoactive bowel sounds, no HSM Stoma site is pink Extremities: warm and well perfused, no clubbing, cyanosis, or edema Skin: No rashes or lesions ECOG PERFORMANCE STATUS: 1 - Symptomatic but completely ambulatory    LABORATORY DATA: Lab Results  Component Value Date   WBC 9.9 11/15/2012   HGB 12.6 11/15/2012   HCT  35.1* 11/15/2012   MCV 82.2 11/15/2012   PLT 177 11/15/2012       Chemistry      Component Value Date/Time   NA 140 11/16/2012 1256   NA 140 11/14/2012 1112   K 3.6 11/16/2012 1256   K 3.5 11/14/2012 1112   CL 104 11/16/2012 1256   CL 103 11/14/2012 1112   CO2 28 11/16/2012 1256   CO2 29 11/14/2012 1112   BUN 9 11/16/2012 1256   BUN 14.0 11/14/2012 1112   CREATININE 0.72 11/16/2012 1256   CREATININE 0.7 11/14/2012 1112   CREATININE 0.70 07/27/2011 1306      Component Value Date/Time   CALCIUM 8.5 11/16/2012 1256   CALCIUM 9.2 11/14/2012 1112   ALKPHOS 82 11/15/2012 2325   ALKPHOS 81 11/14/2012 1112   AST 39* 11/15/2012 2325   AST 17 11/14/2012 1112   ALT 34 11/15/2012 2325   ALT 19 11/14/2012 1112   BILITOT 0.9 11/15/2012 2325   BILITOT 0.61 11/14/2012 1112     ADDITIONAL INFORMATION: 1. Following placement in clearing solution, three more lymph nodes were identified, all of which are benign. Thus, the total lymph node count is 11. (JK:mw 12-18-11) 2. Following an exhaustive search, additional tissue has been submitted in an attempt to recover lymph nodes. Two more lymph nodes were identified, both benign. This brings the total number of lymph nodes recovered to 13, all of which are benign. (JBK:eps 12/26/11) FINAL DIAGNOSIS Diagnosis 1. Colon, resection margin (donut), distal rectal - COLORECTAL MUCOSA WITH FIBROSIS AND EDEMA, CONSISTENT WITH TREATMENT. - THERE IS NO EVIDENCE OF MALIGNANCY. 2. Colon, total resection (incl lymph nodes) - COLON WITH SCATTERED TUBULAR ADENOMAS. - HIGH GRADE DYSPLASIA IS NOT IDENTIFIED. - BENIGN APPENDIX WITH FIBROUS OBLITERATION OF THE TIP. - THERE IS NO EVIDENCE OF CARCINOMA IN 8 OF 8 LYMPH NODES (0/8). - SEE ONCOLOGY TABLE BELOW. 3. Colon, resection margin (donut), new distal margin and rectum - BENIGN SQUAMOUS LINED MUCOSA. - THERE IS NO EVIDENCE OF MALIGNANCY. Microscopic Comment 2. COLON AND RECTUM Specimen: Colon, appendix, and terminal  ileum. Procedure: Total colectomy. Tumor site: N/A Specimen integrity: Intact. Macroscopic intactness of mesorectum: Complete. Macroscopic tumor perforation: N/A Invasive tumor: N/A 1 of 3 FINAL for NIKEIA, HENKES ([id number]) Microscopic Comment(continued) Microscopic extension of invasive tumor: N/A Lymph-Vascular invasion: N/A Peri-neural invasion: N/A Tumor deposit(s) (discontinuous extramural extension): N/A Resection margins: Negative for adenocarcinoma. Treatment effect (neoadjuvant therapy): Present, significant. Number of lymph nodes examined- 8 ; Number positive - 0 (PLEASE NOTE: Additional  tissue will be submitted in an attempt to recover more lymph nodes) Additional polyp(s): Multiple scattered tubular adenomas. Pathologic Staging: ypT0, ypN0 Ancillary studies: N/A Comments: Grossly, there is a 1.5 cm granular depressed area of mucosa present at the distal portion of the specimen. This entire area has been submitted for histologic evaluation revealing inflamed, fibrotic, and edematous colorectal mucosa, consistent with treatment effect. Adenocarcinoma is not identified. In addition, there are scattered tubular adenomas throughout the specimen. No high grade dysplasia is identified. The case was discussed with Dr. Donell Beers on 12/15/2011. (JBK:gt, 12/15/11) Pecola Leisure MD Pathologist, Electronic Signature (Case signed 12/15/2011) Intraoper   ASSESSMENT: 47 year old female with:  #1. stage III adenocarcinoma of the rectum. Patient is now status post definitive surgery after having received neoadjuvant chemotherapy and radiation therapy. Her final pathology does not reveal any evidence of residual disease. 8 nodes were negative for metastatic disease.  #2 . Patient was begun on adjuvant chemotherapy consisting of Xeloda and Oxaliplatin. She received one cycle. This course was complicated by development of a severe dehydration requiring hospitalization.  #3 Patient and I  discussed rationale doing FOLFIRI. Risks and benefits of both 5-FU leucovorin and irinotecan were discussed with the patient. She understands literature was given to her.  A total of 4 cycles will be planned.  She received her first cycle on 06/04/12 - 07/16/12.  #4 intermittent bowel obstruction unclear etiology.Patient was recently seen by Dr. Donell Beers we did an exam under anesthesia.I suspect she'll be having a procedure for takedown in a few months.  PLAN:  #1 patient will continue to be followed by me every 3 months. I did recommend some B. Complex as well as folic acid.  #2 she knows to call me with any problems.  All questions were answered. The patient knows to call the clinic with any problems, questions or concerns. We can certainly see the patient much sooner if necessary.  I spent >25 minutes counseling the patient face to face. The total time spent in the appointment was 30 minutes.

## 2012-11-27 ENCOUNTER — Encounter (HOSPITAL_COMMUNITY): Payer: Self-pay

## 2012-11-27 ENCOUNTER — Ambulatory Visit (HOSPITAL_COMMUNITY)
Admission: RE | Admit: 2012-11-27 | Discharge: 2012-11-27 | Disposition: A | Discharge status: Home / Self Care | Payer: BC Managed Care – PPO | Source: Ambulatory Visit | Attending: General Surgery | Admitting: General Surgery

## 2012-11-27 DIAGNOSIS — Z85048 Personal history of other malignant neoplasm of rectum, rectosigmoid junction, and anus: Secondary | ICD-10-CM | POA: Insufficient documentation

## 2012-11-27 DIAGNOSIS — Z9049 Acquired absence of other specified parts of digestive tract: Secondary | ICD-10-CM | POA: Insufficient documentation

## 2012-11-27 DIAGNOSIS — Z980 Intestinal bypass and anastomosis status: Secondary | ICD-10-CM | POA: Insufficient documentation

## 2012-11-27 DIAGNOSIS — C20 Malignant neoplasm of rectum: Secondary | ICD-10-CM

## 2012-11-27 MED ORDER — IOHEXOL 300 MG/ML  SOLN
450.0000 mL | Freq: Once | INTRAMUSCULAR | Status: AC | PRN
  Administered 2012-11-27: 450 mL

## 2012-11-28 ENCOUNTER — Telehealth (INDEPENDENT_AMBULATORY_CARE_PROVIDER_SITE_OTHER): Payer: Self-pay

## 2012-11-28 NOTE — Telephone Encounter (Signed)
Pt's husband given results of DG colon.  He would like to know when surgery will be scheduled.  He is concerned about his wife.  States she is still having a lot of abdominal pain, especially at night.

## 2012-11-28 NOTE — Telephone Encounter (Deleted)
Pt's husband called for results

## 2012-12-02 ENCOUNTER — Telehealth (INDEPENDENT_AMBULATORY_CARE_PROVIDER_SITE_OTHER): Payer: Self-pay

## 2012-12-02 NOTE — Telephone Encounter (Signed)
The patient's daughter called to follow up from last week.  They were expecting a call back.

## 2012-12-03 ENCOUNTER — Other Ambulatory Visit (INDEPENDENT_AMBULATORY_CARE_PROVIDER_SITE_OTHER): Payer: Self-pay | Admitting: General Surgery

## 2012-12-03 ENCOUNTER — Telehealth (INDEPENDENT_AMBULATORY_CARE_PROVIDER_SITE_OTHER): Payer: Self-pay | Admitting: General Surgery

## 2012-12-03 NOTE — Telephone Encounter (Signed)
i talked to husband and son.  We will put orders through for surgery.  I also advised him that we would refer for pelvic floor rehab.  I will need to do dx laparoscopy at time of ileostomy takedown to look for adhesions.

## 2012-12-05 ENCOUNTER — Telehealth (INDEPENDENT_AMBULATORY_CARE_PROVIDER_SITE_OTHER): Payer: Self-pay

## 2012-12-05 ENCOUNTER — Telehealth (INDEPENDENT_AMBULATORY_CARE_PROVIDER_SITE_OTHER): Payer: Self-pay | Admitting: General Surgery

## 2012-12-05 NOTE — Telephone Encounter (Signed)
I spoke with Renee Nicholson this morning about coming in for another pre-op appt for his wife.  He said that he is not sure why his son or daughter called with more questions.  He states that he has a clear understanding of what to expect after surgery, and his wife is comfortable as well.  He does not feel that another pre-op appt is necessary at this time.  Message relayed to Dr. Donell Beers.

## 2012-12-05 NOTE — Telephone Encounter (Signed)
Spoke with patients son to let them know [facility] will be calling per Harriett Sine to arrange outpatient PT. I have faxed ordere to [phone]

## 2012-12-06 NOTE — Telephone Encounter (Signed)
If her surgery is more than 30 days after her last appt, i need to see her anyway.

## 2012-12-11 ENCOUNTER — Ambulatory Visit: Payer: BC Managed Care – PPO | Attending: General Surgery | Admitting: Physical Therapy

## 2012-12-11 DIAGNOSIS — M2420 Disorder of ligament, unspecified site: Secondary | ICD-10-CM | POA: Insufficient documentation

## 2012-12-11 DIAGNOSIS — IMO0001 Reserved for inherently not codable concepts without codable children: Secondary | ICD-10-CM | POA: Insufficient documentation

## 2012-12-12 ENCOUNTER — Ambulatory Visit (HOSPITAL_BASED_OUTPATIENT_CLINIC_OR_DEPARTMENT_OTHER): Payer: BC Managed Care – PPO | Admitting: Oncology

## 2012-12-12 ENCOUNTER — Telehealth: Payer: Self-pay | Admitting: Oncology

## 2012-12-12 ENCOUNTER — Other Ambulatory Visit (HOSPITAL_BASED_OUTPATIENT_CLINIC_OR_DEPARTMENT_OTHER): Payer: BC Managed Care – PPO | Admitting: Lab

## 2012-12-12 VITALS — BP 111/79 | HR 72 | Temp 98.6°F | Resp 20 | Ht <= 58 in | Wt 76.3 lb

## 2012-12-12 DIAGNOSIS — C20 Malignant neoplasm of rectum: Secondary | ICD-10-CM

## 2012-12-12 LAB — CBC WITH DIFFERENTIAL/PLATELET
BASO%: 0.4 % (ref 0.0–2.0)
Basophils Absolute: 0 10*3/uL (ref 0.0–0.1)
HCT: 35 % (ref 34.8–46.6)
LYMPH%: 24.1 % (ref 14.0–49.7)
MCH: 30 pg (ref 25.1–34.0)
MCHC: 34.5 g/dL (ref 31.5–36.0)
MONO#: 0.4 10*3/uL (ref 0.1–0.9)
NEUT%: 61.9 % (ref 38.4–76.8)
Platelets: 172 10*3/uL (ref 145–400)
WBC: 4.1 10*3/uL (ref 3.9–10.3)

## 2012-12-12 LAB — COMPREHENSIVE METABOLIC PANEL (CC13)
ALT: 19 U/L (ref 0–55)
BUN: 12.8 mg/dL (ref 7.0–26.0)
CO2: 29 mEq/L (ref 22–29)
Creatinine: 0.8 mg/dL (ref 0.6–1.1)
Total Bilirubin: 0.89 mg/dL (ref 0.20–1.20)

## 2012-12-12 NOTE — Telephone Encounter (Signed)
gv pt spouse appt schedule for June.

## 2012-12-12 NOTE — Patient Instructions (Addendum)
Doing well I will see you back in 3 months

## 2012-12-16 ENCOUNTER — Ambulatory Visit (INDEPENDENT_AMBULATORY_CARE_PROVIDER_SITE_OTHER): Payer: BC Managed Care – PPO | Admitting: General Surgery

## 2012-12-16 ENCOUNTER — Encounter (HOSPITAL_COMMUNITY): Payer: Self-pay | Admitting: Pharmacy Technician

## 2012-12-16 ENCOUNTER — Encounter (INDEPENDENT_AMBULATORY_CARE_PROVIDER_SITE_OTHER): Payer: Self-pay | Admitting: General Surgery

## 2012-12-16 VITALS — BP 118/60 | HR 84 | Temp 98.4°F | Resp 18 | Ht <= 58 in | Wt 77.0 lb

## 2012-12-16 DIAGNOSIS — C20 Malignant neoplasm of rectum: Secondary | ICD-10-CM

## 2012-12-16 NOTE — Patient Instructions (Signed)
Continue physical therapy.    Take the syringe that I gave you and the catheter that will come in the mail this week with you to therapy.    Tell the therapist tomorrow that the catheter will be there this week.    We will move surgery to the end of April so you can have a few more sessions of therapy to make your sphincter muscles stronger.    You do not have to come back for a pre op appointment.    We will plan to look in your abdomen with the small incisions and take the ostomy down.

## 2012-12-16 NOTE — Progress Notes (Signed)
HISTORY: Patient is a 47 year old female who is status post laparoscopic total abdominal colectomy, ileal pouch anal anastomosis, and diverting ileostomy. She has had issues gaining weight. She has had to be admitted several times for small bowel obstruction. 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 She is not having any nausea or vomiting.  She is not having any pain.  She is currently not eating a lot of solid food.       EXAM: Filed Vitals:   12/16/12 1349  BP: 118/60  Pulse: 84  Temp: 98.4 F (36.9 C)  Resp: 18    General:  Alert and oriented Incision: well healed.  Reasonable ileostomy output consistency.    Looks better overall.  Still thin.     PATHOLOGY: n/a   ASSESSMENT AND PLAN:   Rectal cancer s/p proctocolectomy, j-pouch with ileoanal anastomosis, loop ileostomy on 12/14/11 Discussed with patient the role of pelvic rehabilitation prior to ileostomy takedown. I discussed this with the physical therapist as well.  Her muscles are at a 2/5 strength. She is going to work with some catheter related therapy in order to really check her ability to hold the balloon in and to expel it.    I will move her surgery to later in April.    I discussed the risks of bleeding, infection, loose stools and difficulty with control of stools.  I discussed the risk of bowel injury and risk of leak.         Maudry Diego, MD Surgical Oncology, General & Endocrine Surgery [facility]  [name], MD [name], MD

## 2012-12-16 NOTE — Assessment & Plan Note (Addendum)
Discussed with patient the role of pelvic rehabilitation prior to ileostomy takedown. I discussed this with the physical therapist as well.  Her muscles are at a 2/5 strength. She is going to work with some catheter related therapy in order to really check her ability to hold the balloon in and to expel it.    I will move her surgery to later in April.    I discussed the risks of bleeding, infection, loose stools and difficulty with control of stools.  I discussed the risk of bowel injury and risk of leak.

## 2012-12-17 ENCOUNTER — Ambulatory Visit: Payer: BC Managed Care – PPO | Admitting: Physical Therapy

## 2012-12-18 ENCOUNTER — Ambulatory Visit: Payer: BC Managed Care – PPO | Admitting: Physical Therapy

## 2012-12-19 ENCOUNTER — Ambulatory Visit: Payer: BC Managed Care – PPO | Admitting: Physical Therapy

## 2012-12-20 ENCOUNTER — Inpatient Hospital Stay (HOSPITAL_COMMUNITY): Admission: RE | Admit: 2012-12-20 | Payer: BC Managed Care – PPO | Source: Ambulatory Visit

## 2012-12-23 ENCOUNTER — Ambulatory Visit: Payer: BC Managed Care – PPO | Admitting: Physical Therapy

## 2012-12-23 ENCOUNTER — Telehealth (INDEPENDENT_AMBULATORY_CARE_PROVIDER_SITE_OTHER): Payer: Self-pay

## 2012-12-23 NOTE — Telephone Encounter (Signed)
Renee Nicholson from [facility] out pt rehab called to give values on RAIR rectal reflex test. She states study shows slight urge at 20ml and strong urge at 45ml. She will fax hard copy of result to Dr Donell Beers. I advised her I will send this msg to Dr Donell Beers.

## 2012-12-24 ENCOUNTER — Emergency Department (HOSPITAL_COMMUNITY): Payer: BC Managed Care – PPO

## 2012-12-24 ENCOUNTER — Inpatient Hospital Stay (HOSPITAL_COMMUNITY)
Admission: EM | Admit: 2012-12-24 | Discharge: 2012-12-27 | DRG: 181 | Disposition: A | Discharge status: Home / Self Care | Payer: BC Managed Care – PPO | Attending: General Surgery | Admitting: General Surgery

## 2012-12-24 ENCOUNTER — Encounter (HOSPITAL_COMMUNITY): Payer: Self-pay

## 2012-12-24 DIAGNOSIS — Z932 Ileostomy status: Secondary | ICD-10-CM

## 2012-12-24 DIAGNOSIS — F411 Generalized anxiety disorder: Secondary | ICD-10-CM | POA: Diagnosis present

## 2012-12-24 DIAGNOSIS — K219 Gastro-esophageal reflux disease without esophagitis: Secondary | ICD-10-CM | POA: Diagnosis present

## 2012-12-24 DIAGNOSIS — Z79899 Other long term (current) drug therapy: Secondary | ICD-10-CM

## 2012-12-24 DIAGNOSIS — K56609 Unspecified intestinal obstruction, unspecified as to partial versus complete obstruction: Secondary | ICD-10-CM

## 2012-12-24 DIAGNOSIS — Z923 Personal history of irradiation: Secondary | ICD-10-CM

## 2012-12-24 DIAGNOSIS — Z85048 Personal history of other malignant neoplasm of rectum, rectosigmoid junction, and anus: Secondary | ICD-10-CM

## 2012-12-24 DIAGNOSIS — Z9049 Acquired absence of other specified parts of digestive tract: Secondary | ICD-10-CM

## 2012-12-24 DIAGNOSIS — K565 Intestinal adhesions [bands], unspecified as to partial versus complete obstruction: Principal | ICD-10-CM | POA: Diagnosis present

## 2012-12-24 DIAGNOSIS — Z9221 Personal history of antineoplastic chemotherapy: Secondary | ICD-10-CM

## 2012-12-24 DIAGNOSIS — Z800 Family history of malignant neoplasm of digestive organs: Secondary | ICD-10-CM

## 2012-12-24 LAB — CBC WITH DIFFERENTIAL/PLATELET
Eosinophils Relative: 1 % (ref 0–5)
HCT: 35.4 % — ABNORMAL LOW (ref 36.0–46.0)
Hemoglobin: 12.4 g/dL (ref 12.0–15.0)
Lymphocytes Relative: 9 % — ABNORMAL LOW (ref 12–46)
Lymphs Abs: 0.7 10*3/uL (ref 0.7–4.0)
MCV: 84.5 fL (ref 78.0–100.0)
Monocytes Absolute: 0.6 10*3/uL (ref 0.1–1.0)
Monocytes Relative: 7 % (ref 3–12)
Neutro Abs: 6.8 10*3/uL (ref 1.7–7.7)
RBC: 4.19 MIL/uL (ref 3.87–5.11)
RDW: 13.6 % (ref 11.5–15.5)
WBC: 8.1 10*3/uL (ref 4.0–10.5)

## 2012-12-24 LAB — POCT I-STAT, CHEM 8
BUN: 14 mg/dL (ref 6–23)
Calcium, Ion: 1.17 mmol/L (ref 1.12–1.23)
Chloride: 97 mEq/L (ref 96–112)
Creatinine, Ser: 0.9 mg/dL (ref 0.50–1.10)
Glucose, Bld: 118 mg/dL — ABNORMAL HIGH (ref 70–99)
TCO2: 33 mmol/L (ref 0–100)

## 2012-12-24 MED ORDER — ONDANSETRON HCL 4 MG/2ML IJ SOLN
4.0000 mg | Freq: Four times a day (QID) | INTRAMUSCULAR | Status: DC | PRN
  Administered 2012-12-25: 4 mg via INTRAVENOUS
  Filled 2012-12-24: qty 2

## 2012-12-24 MED ORDER — KCL IN DEXTROSE-NACL 20-5-0.45 MEQ/L-%-% IV SOLN
INTRAVENOUS | Status: DC
  Administered 2012-12-25 – 2012-12-26 (×4): via INTRAVENOUS
  Administered 2012-12-26: 100 mL/h via INTRAVENOUS
  Administered 2012-12-27: 07:00:00 via INTRAVENOUS
  Filled 2012-12-24 (×8): qty 1000

## 2012-12-24 MED ORDER — ENOXAPARIN SODIUM 40 MG/0.4ML ~~LOC~~ SOLN: 40.0000 mg | SUBCUTANEOUS | Status: DC

## 2012-12-24 MED ORDER — ACETAMINOPHEN 325 MG PO TABS: 650.0000 mg | ORAL_TABLET | Freq: Four times a day (QID) | ORAL | Status: DC | PRN

## 2012-12-24 MED ORDER — ENOXAPARIN SODIUM 30 MG/0.3ML ~~LOC~~ SOLN
30.0000 mg | Freq: Every day | SUBCUTANEOUS | Status: DC
  Administered 2012-12-25 – 2012-12-26 (×3): 30 mg via SUBCUTANEOUS
  Filled 2012-12-24 (×4): qty 0.3

## 2012-12-24 MED ORDER — SODIUM CHLORIDE 0.9 % IV SOLN
Freq: Once | INTRAVENOUS | Status: AC
  Administered 2012-12-24: 21:00:00 via INTRAVENOUS

## 2012-12-24 MED ORDER — ONDANSETRON HCL 4 MG/2ML IJ SOLN
4.0000 mg | Freq: Once | INTRAMUSCULAR | Status: AC
  Administered 2012-12-24: 4 mg via INTRAVENOUS
  Filled 2012-12-24: qty 2

## 2012-12-24 MED ORDER — ACETAMINOPHEN 650 MG RE SUPP: 650.0000 mg | Freq: Four times a day (QID) | RECTAL | Status: DC | PRN

## 2012-12-24 MED ORDER — HYDROMORPHONE HCL PF 1 MG/ML IJ SOLN
1.0000 mg | INTRAMUSCULAR | Status: DC | PRN
  Administered 2012-12-24: 1 mg via INTRAVENOUS
  Filled 2012-12-24: qty 1

## 2012-12-24 MED ORDER — PANTOPRAZOLE SODIUM 40 MG IV SOLR
40.0000 mg | Freq: Every day | INTRAVENOUS | Status: DC
  Administered 2012-12-25 – 2012-12-26 (×2): 40 mg via INTRAVENOUS
  Filled 2012-12-24 (×3): qty 40

## 2012-12-24 NOTE — H&P (Signed)
Renee Nicholson is an 47 y.o. female.    General Surgery [facility]  Chief Complaint: abdominal pain, nausea, vomiting, no output from ostomy  HPI: patient is a 47 yo female status post total colectomy with ileo-anal anastomosis and diverting loop ileostomy in March 2013.  Complicated by stricture requiring dilatation.  This is third episode of small bowel obstruction requiring admission.  Patient presents with 24 hr of no ostomy output, abdominal and back pain, nausea and emesis.  AXR in ER shows findings consistent with SBO.  Past Medical History  Diagnosis Date  . Anemia   . Diarrhea   . Anxiety   . Bright red rectal bleeding 09/13/2011  . History of chemotherapy     completed 09/2011   . Hx of radiation therapy 08/29/11 to 10/09/11    rectum  . GERD (gastroesophageal reflux disease)   . Ileostomy in place   . Cancer   . Rectal cancer     Past Surgical History  Procedure Laterality Date  . Tubal ligation    . Carpal tunnel release    . Colonoscopy  07/26/2011    Procedure: COLONOSCOPY;  Surgeon: Arlyce Harman, MD;  [facility];  Service: Endoscopy;  Laterality: N/A;  10:40  . Esophagogastroduodenoscopy  07/26/2011    Procedure: ESOPHAGOGASTRODUODENOSCOPY (EGD);  Surgeon: Arlyce Harman, MD;  [facility];  Service: Endoscopy;  Laterality: N/A;  . Esophagogastroduodenoscopy  12/27/2011    Procedure: ESOPHAGOGASTRODUODENOSCOPY (EGD);  Surgeon: Shirley Friar, MD;  Location: [facility];  Service: Endoscopy;  Laterality: N/A;  . Laparoscopic colon resection  12/14/11    diverting ileostomy  . Portacath placement  02/21/2012    Procedure: INSERTION PORT-A-CATH;  Surgeon: [name], MD;  Location: [facility];  Service: General;  Laterality: N/A;  . Evaluation under anesthesia with anal fissurotomy N/A 11/06/2012    Procedure: Exam Under Anesthesia , Anal Dilation;  Surgeon: [name], MD;  Location: [facility];  Service: General;  Laterality:  N/A;  Exam Under Anesthesia , Anal Dilation    Family History  Problem Relation Age of Onset  . Diabetes Mother   . Colon cancer Neg Hx   . Cancer Brother 35    rectal cancer lives in [place]   Social History:  reports that she has never smoked. She has never used smokeless tobacco. She reports that she does not drink alcohol or use illicit drugs.  Allergies: No Known Allergies   (Not in a hospital admission)  Results for orders placed during the hospital encounter of 12/24/12 (from the past 48 hour(s))  CBC WITH DIFFERENTIAL     Status: Abnormal   Collection Time    12/24/12  8:30 PM      Result Value Range   WBC 8.1  4.0 - 10.5 K/uL   RBC 4.19  3.87 - 5.11 MIL/uL   Hemoglobin 12.4  12.0 - 15.0 g/dL   HCT 47.8 (*) 29.5 - 62.1 %   MCV 84.5  78.0 - 100.0 fL   MCH 29.6  26.0 - 34.0 pg   MCHC 35.0  30.0 - 36.0 g/dL   RDW 30.8  65.7 - 84.6 %   Platelets 196  150 - 400 K/uL   Neutrophils Relative 83 (*) 43 - 77 %   Neutro Abs 6.8  1.7 - 7.7 K/uL   Lymphocytes Relative 9 (*) 12 - 46 %   Lymphs Abs 0.7  0.7 - 4.0 K/uL  Monocytes Relative 7  3 - 12 %   Monocytes Absolute 0.6  0.1 - 1.0 K/uL   Eosinophils Relative 1  0 - 5 %   Eosinophils Absolute 0.1  0.0 - 0.7 K/uL   Basophils Relative 0  0 - 1 %   Basophils Absolute 0.0  0.0 - 0.1 K/uL  POCT I-STAT, CHEM 8     Status: Abnormal   Collection Time    12/24/12  8:49 PM      Result Value Range   Sodium 136  135 - 145 mEq/L   Potassium 3.7  3.5 - 5.1 mEq/L   Chloride 97  96 - 112 mEq/L   BUN 14  6 - 23 mg/dL   Creatinine, Ser 1.61  0.50 - 1.10 mg/dL   Glucose, Bld 096 (*) 70 - 99 mg/dL   Calcium, Ion 0.45  4.09 - 1.23 mmol/L   TCO2 33  0 - 100 mmol/L   Hemoglobin 12.6  12.0 - 15.0 g/dL   HCT 81.1  91.4 - 78.2 %   Dg Abd Acute W/chest  12/24/2012  *RADIOLOGY REPORT*  Clinical Data: Constipation, nausea/vomiting, colostomy  ACUTE ABDOMEN SERIES (ABDOMEN 2 VIEW & CHEST 1 VIEW)  Comparison: 11/16/2012  Findings: Lungs are  essentially clear.  No focal consolidation. No pleural effusion or pneumothorax.  Left chest power port.  The heart is normal in size.  Multiple mildly dilated loops of small bowel with possible thickened mucosal folds.  No evidence of free air on the lateral decubitus view.  Surgical sutures overlying the pelvis.  Visualized osseous structures are within normal limits.  IMPRESSION: No evidence of acute cardiopulmonary disease.  Multiple dilated loops of small bowel with possible thickened mucosal folds, suggesting small bowel obstruction or enteritis.  No free air.   Original Report Authenticated By: Charline Bills, M.D.     Review of Systems  Constitutional: Negative.   HENT: Negative.   Eyes: Negative.   Respiratory: Negative.   Cardiovascular: Negative.   Gastrointestinal: Positive for nausea, vomiting, abdominal pain and constipation.  Genitourinary: Negative.   Musculoskeletal: Positive for back pain.  Skin: Negative.   Neurological: Negative.   Endo/Heme/Allergies: Negative.   Psychiatric/Behavioral: Negative.     Blood pressure 140/79, pulse 78, temperature 98.1 F (36.7 C), temperature source Oral, resp. rate 20, last menstrual period 02/20/2012, SpO2 98.00%. Physical Exam  Constitutional: She is oriented to person, place, and time. No distress.  Thin, underweight  HENT:  Head: Normocephalic and atraumatic.  Right Ear: External ear normal.  Left Ear: External ear normal.  Mouth/Throat: Oropharynx is clear and moist.  Eyes: Conjunctivae are normal. Pupils are equal, round, and reactive to light. No scleral icterus.  Neck: Normal range of motion. Neck supple. No thyromegaly present.  Cardiovascular: Normal rate and regular rhythm.   No murmur heard. Respiratory: Effort normal and breath sounds normal. She has no wheezes.  GI: Soft. Bowel sounds are normal. She exhibits no distension and no mass. There is tenderness. There is no rebound and no guarding.  Ostomy RLQ, small  thin liquid in bag  Musculoskeletal: Normal range of motion. She exhibits no edema.  Neurological: She is alert and oriented to person, place, and time.  Skin: Skin is warm and dry.  Psychiatric: She has a normal mood and affect. Her behavior is normal.     Assessment/Plan Small bowel obstruction, likely secondary to post op adhesions  Admit to surgical team - Dr. [name]  IV hydration, NPO  Pain Rx  Nausea Rx  Kpad for back pain at patient request  Velora Heckler, MD, [facility] Office: [phone]    Gerell Fortson Judie Petit 12/24/2012, 11:10 PM

## 2012-12-24 NOTE — ED Provider Notes (Signed)
History     CSN: [id number]  Arrival date & time 12/24/12  1948   First MD Initiated Contact with Patient 12/24/12 2017      Chief Complaint  Patient presents with  . Abdominal Pain  . Emesis    (Consider location/radiation/quality/duration/timing/severity/associated sxs/prior treatment) HPI Comments: Patient presents today with no bowel movements since 5 PM last night.  She does have a history of bowel obstructions.  She has had a colostomy for the past year.  Her scheduled for take down April 23 she reports several episodes of nausea and vomiting.  Patient is a 47 y.o. female presenting with abdominal pain and vomiting.  Abdominal Pain Pain location:  Generalized Pain quality: fullness   Pain radiates to:  Does not radiate Pain severity:  Moderate Onset quality:  Gradual Duration:  24 hours Timing:  Constant Progression:  Worsening Chronicity:  Recurrent Relieved by:  Nothing Worsened by:  Nothing tried Ineffective treatments:  None tried Associated symptoms: constipation, nausea and vomiting   Associated symptoms: no chest pain, no dysuria, no fever and no shortness of breath   Emesis Associated symptoms: abdominal pain     Past Medical History  Diagnosis Date  . Anemia   . Diarrhea   . Anxiety   . Bright red rectal bleeding 09/13/2011  . History of chemotherapy     completed 09/2011   . Hx of radiation therapy 08/29/11 to 10/09/11    rectum  . GERD (gastroesophageal reflux disease)   . Ileostomy in place   . Cancer   . Rectal cancer     Past Surgical History  Procedure Laterality Date  . Tubal ligation    . Carpal tunnel release    . Colonoscopy  07/26/2011    Procedure: COLONOSCOPY;  Surgeon: Arlyce Harman, MD;  [facility];  Service: Endoscopy;  Laterality: N/A;  10:40  . Esophagogastroduodenoscopy  07/26/2011    Procedure: ESOPHAGOGASTRODUODENOSCOPY (EGD);  Surgeon: Arlyce Harman, MD;  [facility];  Service: Endoscopy;   Laterality: N/A;  . Esophagogastroduodenoscopy  12/27/2011    Procedure: ESOPHAGOGASTRODUODENOSCOPY (EGD);  Surgeon: Shirley Friar, MD;  Location: [facility];  Service: Endoscopy;  Laterality: N/A;  . Laparoscopic colon resection  12/14/11    diverting ileostomy  . Portacath placement  02/21/2012    Procedure: INSERTION PORT-A-CATH;  Surgeon: [name], MD;  Location: [facility];  Service: General;  Laterality: N/A;  . Evaluation under anesthesia with anal fissurotomy N/A 11/06/2012    Procedure: Exam Under Anesthesia , Anal Dilation;  Surgeon: [name], MD;  Location: [facility];  Service: General;  Laterality: N/A;  Exam Under Anesthesia , Anal Dilation    Family History  Problem Relation Age of Onset  . Diabetes Mother   . Colon cancer Neg Hx   . Cancer Brother 35    rectal cancer lives in [place]    History  Substance Use Topics  . Smoking status: Never Smoker   . Smokeless tobacco: Never Used  . Alcohol Use: No    OB History   Grav Para Term Preterm Abortions TAB SAB Ect Mult Living                  Review of Systems  Constitutional: Positive for appetite change. Negative for fever.  Respiratory: Negative for shortness of breath.   Cardiovascular: Negative for chest pain.  Gastrointestinal: Positive for nausea, vomiting, abdominal pain and constipation.  Genitourinary: Negative for dysuria.  Musculoskeletal: Negative  for back pain.  Skin: Positive for pallor.  All other systems reviewed and are negative.    Allergies  Review of patient's allergies indicates no known allergies.  Home Medications   Current Outpatient Rx  Name  Route  Sig  Dispense  Refill  . acetaminophen (TYLENOL) 325 MG tablet   Oral   Take 650 mg by mouth every 6 (six) hours as needed. Headache/pain         . B Complex-C (B-COMPLEX WITH VITAMIN C) tablet   Oral   Take 1 tablet by mouth daily.         . ferrous sulfate 325 (65 FE) MG tablet   Oral   Take 325 mg by mouth daily with  breakfast.         . lactose free nutrition (BOOST) LIQD   Oral   Take 237 mLs by mouth 2 (two) times daily.         . Multiple Vitamin (MULTIVITAMIN) tablet   Oral   Take 1 tablet by mouth every morning.          . pantoprazole (PROTONIX) 40 MG tablet   Oral   Take 40 mg by mouth every morning.          . potassium chloride SA (K-DUR,KLOR-CON) 20 MEQ tablet   Oral   Take 20 mEq by mouth every morning.         . Simethicone 125 MG CAPS   Oral   Take 1 capsule by mouth 4 (four) times daily as needed (for gas or upset stomach).          . diphenoxylate-atropine (LOMOTIL) 2.5-0.025 MG per tablet   Oral   Take 1 tablet by mouth 4 (four) times daily as needed for diarrhea or loose stools.   60 tablet   5     BP 140/79  Pulse 78  Temp(Src) 98.1 F (36.7 C) (Oral)  Resp 20  SpO2 98%  LMP 02/20/2012  Physical Exam  Constitutional: She is oriented to person, place, and time. She appears well-developed. She appears cachectic. No distress.  HENT:  Head: Normocephalic and atraumatic.  Eyes: Pupils are equal, round, and reactive to light.  Neck: Normal range of motion.  Cardiovascular: Normal rate.   Pulmonary/Chest: Effort normal and breath sounds normal.  Abdominal: She exhibits distension. Bowel sounds are increased. There is generalized tenderness.  Musculoskeletal: Normal range of motion.  Neurological: She is alert and oriented to person, place, and time.  Skin: Skin is warm. There is pallor.    ED Course  Procedures (including critical care time)  Labs Reviewed  CBC WITH DIFFERENTIAL - Abnormal; Notable for the following:    HCT 35.4 (*)    Neutrophils Relative 83 (*)    Lymphocytes Relative 9 (*)    All other components within normal limits  POCT I-STAT, CHEM 8 - Abnormal; Notable for the following:    Glucose, Bld 118 (*)    All other components within normal limits   Dg Abd Acute W/chest  12/24/2012  *RADIOLOGY REPORT*  Clinical Data:  Constipation, nausea/vomiting, colostomy  ACUTE ABDOMEN SERIES (ABDOMEN 2 VIEW & CHEST 1 VIEW)  Comparison: 11/16/2012  Findings: Lungs are essentially clear.  No focal consolidation. No pleural effusion or pneumothorax.  Left chest power port.  The heart is normal in size.  Multiple mildly dilated loops of small bowel with possible thickened mucosal folds.  No evidence of free air on the lateral decubitus view.  Surgical sutures overlying the pelvis.  Visualized osseous structures are within normal limits.  IMPRESSION: No evidence of acute cardiopulmonary disease.  Multiple dilated loops of small bowel with possible thickened mucosal folds, suggesting small bowel obstruction or enteritis.  No free air.   Original Report Authenticated By: Charline Bills, M.D.      1. Small bowel obstruction       MDM   Spoke with Dr. Georgana Curio who will evaluate patient and admit         Arman Filter, NP 12/24/12 2212

## 2012-12-24 NOTE — ED Provider Notes (Signed)
Medical screening examination/treatment/procedure(s) were conducted as a shared visit with non-physician practitioner(s) and myself.  I personally evaluated the patient during the encounter   Loren Racer, MD 12/24/12 2311

## 2012-12-24 NOTE — ED Notes (Signed)
Pt has colostomy for 1 year.  Pt was seen by MD and scheduled for take-down in April.  Was also going to sphincter therapy to assist with post removal bowel habits.  However, yesterday pt became nauseated and vomiting.  No passing of gas or bm since yesterday morning.  Pt states abdominal distension noted.

## 2012-12-25 ENCOUNTER — Observation Stay (HOSPITAL_COMMUNITY): Payer: BC Managed Care – PPO

## 2012-12-25 ENCOUNTER — Encounter: Payer: BC Managed Care – PPO | Admitting: Physical Therapy

## 2012-12-25 MED ORDER — PROMETHAZINE HCL 25 MG/ML IJ SOLN
12.5000 mg | INTRAMUSCULAR | Status: DC | PRN
  Administered 2012-12-25 (×2): 12.5 mg via INTRAVENOUS
  Filled 2012-12-25 (×2): qty 1

## 2012-12-25 NOTE — Progress Notes (Signed)
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  PRIOR THERAPY:   #1 patient presented with a several month history of diarrhea and then subsequent bleeding. She was found to be anemic with a hemoglobin of 10. Because of this she went on to have a colonoscopy performed. The colonoscopy showed multiple sessile polyps in the cecum ascending transverse and sigmoid colon as well as the rectum. The rectal area showed a large exophytic rectal mass approximately 1 cm above the dentate line measuring 3-4 cm. She had a biopsy of this performed and was found to be an adenocarcinoma consistent with a rectal primary.  #2 patient was seen by Dr. Carlynn Nicholson at [facility] who performed a rectal ultrasound. The staging clinically was T3 N1. It was recommended by Dr. Lennart Nicholson the patient undergo neoadjuvant chemotherapy and radiation higher to her definitive surgery.  #3 Patient has completed concurrent radiation and chemotherapy. Her chemotherapy consisted of single agent xeloda administrated 08/22/11 - 10/09/2011. Overall she tolerated her treatment very well  #4 Has completed all of neoadjuvant treatment And the PET scan shows no evidence of distant metastasis and a para rectal peritoneal nodes less impressive she has had significant response to therapy.  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 APC is negative  #6 patient had a laparotomy with proctocolectomy, J-pouch with ileoanal anastomosis, loop ileostomy on 12/14/2011. The final pathology did not reveal any evidence of residual disease. Patient had a  complicated postop course including high output ileostomy. She required intensive IV fluids on an outpatient basis to prevent dehydration.  #7 patient began adjuvant chemotherapy consisting of XELOX starting 03/05/2012 on a 21 day cycle. A Total of 6 cycles were planned unfortunate patient was not able to tolerate this therapy due to side effects from the xeloda With development of hand-foot syndrome.She was not able to tolerate oxaliplatin due to significant toxicity from cold sensitivity. In that this treatment is being discontinued.  #8 patient subsequently began adjuvant FOLFIRI beginning on 06/04/2012. She is planned for 4 cycles. She  completed all her chemotherapy on 07/16/12  CURRENT THERAPY: Observation  INTERVAL HISTORY: Renee Nicholson 46 y.o. female returns for followup visit today. She's doing well today.  She continues to have stool output in her ostomy bag.  She's had no further obstruction or pain.  She has no dysuria, or hematuria, no abd/flank pain.  No diarrhea, fevers, chills.  Otherwise she's doing well and a 10 point ROS is neg.   MEDICAL HISTORY: Past Medical History  Diagnosis Date  . Anemia   . Diarrhea   . Anxiety   . Bright red rectal bleeding 09/13/2011  . History of chemotherapy     completed 09/2011   . Hx of radiation therapy 08/29/11 to 10/09/11    rectum  . GERD (gastroesophageal reflux disease)   . Ileostomy in place   . Cancer   . Rectal cancer     ALLERGIES:  has No Known Allergies.  MEDICATIONS:  No current facility-administered medications for this visit.   No current outpatient prescriptions on file.   Facility-Administered Medications Ordered in Other  Visits  Medication Dose Route Frequency Provider Last Rate Last Dose  . acetaminophen (TYLENOL) tablet 650 mg  650 mg Oral Q6H PRN Renee Heckler, MD       Or  . acetaminophen (TYLENOL) suppository 650 mg  650 mg Rectal Q6H PRN Renee Heckler, MD      . dextrose 5 % and 0.45 % NaCl with KCl 20  mEq/L infusion   Intravenous Continuous Renee Heckler, MD 100 mL/hr at 12/25/12 0231    . enoxaparin (LOVENOX) injection 30 mg  30 mg Subcutaneous QHS Renee Lint, MD   30 mg at 12/25/12 0255  . heparin lock flush 100 unit/mL  500 Units Intravenous Once Renee December, MD      . HYDROmorphone (DILAUDID) injection 1 mg  1 mg Intravenous Q2H PRN Renee Heckler, MD   1 mg at 12/24/12 2342  . ondansetron (ZOFRAN) injection 4 mg  4 mg Intravenous Q6H PRN Renee Heckler, MD   4 mg at 12/25/12 0118  . pantoprazole (PROTONIX) injection 40 mg  40 mg Intravenous QHS Renee Heckler, MD      . promethazine [facility]) injection 12.5 mg  12.5 mg Intravenous Q4H PRN Renee Heckler, MD   12.5 mg at 12/25/12 0255  . sodium chloride 0.9 % injection 10 mL  10 mL Intravenous PRN Renee December, MD        SURGICAL HISTORY:  Past Surgical History  Procedure Laterality Date  . Tubal ligation    . Carpal tunnel release    . Colonoscopy  07/26/2011    Procedure: COLONOSCOPY;  Surgeon: Renee Harman, MD;  [facility];  Service: Endoscopy;  Laterality: N/A;  10:40  . Esophagogastroduodenoscopy  07/26/2011    Procedure: ESOPHAGOGASTRODUODENOSCOPY (EGD);  Surgeon: Renee Harman, MD;  [facility];  Service: Endoscopy;  Laterality: N/A;  . Esophagogastroduodenoscopy  12/27/2011    Procedure: ESOPHAGOGASTRODUODENOSCOPY (EGD);  Surgeon: Renee Friar, MD;  Location: [facility];  Service: Endoscopy;  Laterality: N/A;  . Laparoscopic colon resection  12/14/11    diverting ileostomy  . Portacath placement  02/21/2012    Procedure: INSERTION PORT-A-CATH;  Surgeon: Renee Lint, MD;  Location: [facility];  Service: General;  Laterality: N/A;  . Evaluation under anesthesia with anal fissurotomy N/A 11/06/2012    Procedure: Exam Under Anesthesia , Anal Dilation;  Surgeon: Renee Lint, MD;  Location: [facility];  Service: General;  Laterality: N/A;  Exam Under Anesthesia , Anal Dilation    REVIEW OF SYSTEMS:    General: fatigue (+), night sweats (-), fever (-), pain (-) Lymph: palpable nodes (-) HEENT: vision changes (-), mucositis (-), gum bleeding (-), epistaxis (-) Cardiovascular: chest pain (-), palpitations (-) Pulmonary: shortness of breath (-), dyspnea on exertion (-), cough (-), hemoptysis (-) GI:  Early satiety (-), melena (-), dysphagia (-), nausea/vomiting (-), diarrhea (-) GU: dysuria (-), hematuria (-), incontinence (-) Musculoskeletal: joint swelling (-), joint pain (-), back pain (-) Neuro: weakness (-), numbness (-), headache (-), confusion (-) Skin: Rash (-), lesions (-), dryness (-) Psych: depression (-), suicidal/homicidal ideation (-), feeling of hopelessness (-)   PHYSICAL EXAMINATION:  BP 111/79  Pulse 72  Temp(Src) 98.6 F (37 C) (Oral)  Resp 20  Ht 4\' 5"  (1.346 m)  Wt 76 lb 4.8 oz (34.609 kg)  BMI 19.1 kg/m2  LMP 02/20/2012 Gen.: Patient is a thin female but in no acute distress HEENT: PERRLA sclerae anicteric no conjunctival  pallor oral mucosa is moist no thrush Neck: supple, no palpable adenopathy Lungs: clear to auscultation, no wheezes, rhonchi, or rales Cardiovascular: regular rate rhythm, S1, S2, no murmurs, rubs or gallops Abdomen: Soft, non-tender, non-distended, normoactive bowel sounds, no HSM Stoma site is pink Extremities: warm and well perfused, no clubbing, cyanosis, or edema Skin: No rashes or lesions ECOG PERFORMANCE STATUS: 1 - Symptomatic but completely ambulatory    LABORATORY DATA: Lab Results  Component Value Date   WBC 8.1 12/24/2012   HGB 12.6 12/24/2012   HCT 37.0 12/24/2012   MCV 84.5 12/24/2012   PLT 196 12/24/2012       Chemistry      Component Value Date/Time   NA 136 12/24/2012 2049   NA 139 12/12/2012 1037   K 3.7 12/24/2012 2049   K 3.8 12/12/2012 1037   CL 97 12/24/2012 2049   CL 103 12/12/2012 1037   CO2 29 12/12/2012 1037   CO2 28 11/16/2012 1256   BUN 14 12/24/2012 2049   BUN 12.8 12/12/2012 1037   CREATININE 0.90 12/24/2012 2049    CREATININE 0.8 12/12/2012 1037   CREATININE 0.70 07/27/2011 1306      Component Value Date/Time   CALCIUM 9.3 12/12/2012 1037   CALCIUM 8.5 11/16/2012 1256   ALKPHOS 86 12/12/2012 1037   ALKPHOS 82 11/15/2012 2325   AST 20 12/12/2012 1037   AST 39* 11/15/2012 2325   ALT 19 12/12/2012 1037   ALT 34 11/15/2012 2325   BILITOT 0.89 12/12/2012 1037   BILITOT 0.9 11/15/2012 2325     ADDITIONAL INFORMATION: 1. Following placement in clearing solution, three more lymph nodes were identified, all of which are benign. Thus, the total lymph node count is 11. (JK:mw 12-18-11) 2. Following an exhaustive search, additional tissue has been submitted in an attempt to recover lymph nodes. Two more lymph nodes were identified, both benign. This brings the total number of lymph nodes recovered to 13, all of which are benign. (JBK:eps 12/26/11) FINAL DIAGNOSIS Diagnosis 1. Colon, resection margin (donut), distal rectal - COLORECTAL MUCOSA WITH FIBROSIS AND EDEMA, CONSISTENT WITH TREATMENT. - THERE IS NO EVIDENCE OF MALIGNANCY. 2. Colon, total resection (incl lymph nodes) - COLON WITH SCATTERED TUBULAR ADENOMAS. - HIGH GRADE DYSPLASIA IS NOT IDENTIFIED. - BENIGN APPENDIX WITH FIBROUS OBLITERATION OF THE TIP. - THERE IS NO EVIDENCE OF CARCINOMA IN 8 OF 8 LYMPH NODES (0/8). - SEE ONCOLOGY TABLE BELOW. 3. Colon, resection margin (donut), new distal margin and rectum - BENIGN SQUAMOUS LINED MUCOSA. - THERE IS NO EVIDENCE OF MALIGNANCY. Microscopic Comment 2. COLON AND RECTUM Specimen: Colon, appendix, and terminal ileum. Procedure: Total colectomy. Tumor site: N/A Specimen integrity: Intact. Macroscopic intactness of mesorectum: Complete. Macroscopic tumor perforation: N/A Invasive tumor: N/A 1 of 3 FINAL for DRIANNA, CHANDRAN ([id number]) Microscopic Comment(continued) Microscopic extension of invasive tumor: N/A Lymph-Vascular invasion: N/A Peri-neural invasion: N/A Tumor deposit(s) (discontinuous  extramural extension): N/A Resection margins: Negative for adenocarcinoma. Treatment effect (neoadjuvant therapy): Present, significant. Number of lymph nodes examined- 8 ; Number positive - 0 (PLEASE NOTE: Additional tissue will be submitted in an attempt to recover more lymph nodes) Additional polyp(s): Multiple scattered tubular adenomas. Pathologic Staging: ypT0, ypN0 Ancillary studies: N/A Comments: Grossly, there is a 1.5 cm granular depressed area of mucosa present at the distal portion of the specimen. This entire area has been submitted for histologic evaluation revealing inflamed, fibrotic, and edematous colorectal mucosa, consistent with treatment effect. Adenocarcinoma is not identified. In addition, there  are scattered tubular adenomas throughout the specimen. No high grade dysplasia is identified. The case was discussed with Dr. Donell Beers on 12/15/2011. (JBK:gt, 12/15/11) Pecola Leisure MD Pathologist, Electronic Signature (Case signed 12/15/2011) Intraoper   ASSESSMENT: 47 year old female with:  #1. stage III adenocarcinoma of the rectum. Patient is now status post definitive surgery after having received neoadjuvant chemotherapy and radiation therapy. Her final pathology does not reveal any evidence of residual disease. 8 nodes were negative for metastatic disease.  #2 . Patient was begun on adjuvant chemotherapy consisting of Xeloda and Oxaliplatin. She received one cycle. This course was complicated by development of a severe dehydration requiring hospitalization.  #3 Patient and I discussed rationale doing FOLFIRI. Risks and benefits of both 5-FU leucovorin and irinotecan were discussed with the patient. She understands literature was given to her.  A total of 4 cycles will be planned.  She received her first cycle on 06/04/12 - 07/16/12.  #4 patient is gong to undergo colostomy takedown in the next few weeks. She is very excited about this.  PLAN:  #1 patient will continue to  be followed by me every 3 months. I did recommend some B. Complex as well as folic acid. From my perspective patient is doing very well. She is without evidence of recurrent disease.  #2 she knows to call me with any problems.  All questions were answered. The patient knows to call the clinic with any problems, questions or concerns. We can certainly see the patient much sooner if necessary.  I spent 25 minutes counseling the patient face to face. The total time spent in the appointment was 30 minutes.  Drue Second, MD Medical/Oncology [facility] [phone] (beeper) [phone] (Office)

## 2012-12-25 NOTE — Progress Notes (Signed)
  Subjective: Still having some pain.  No vomiting since last night.  No bloating.    Objective: Vital signs in last 24 hours: Temp:  [97.5 F (36.4 C)-98.3 F (36.8 C)] 97.9 F (36.6 C) (04/02 0625) Pulse Rate:  [50-79] 56 (04/02 0625) Resp:  [16-20] 16 (04/02 0625) BP: (92-155)/(59-82) 104/66 mmHg (04/02 0625) SpO2:  [98 %-100 %] 98 % (04/02 0625) Weight:  [72 lb 3.2 oz (32.75 kg)] 72 lb 3.2 oz (32.75 kg) (04/02 0220) Last BM Date: 12/23/12  Intake/Output from previous day: 04/01 0701 - 04/02 0700 In: 311.3 [P.O.:30; I.V.:281.3] Out: 0  Intake/Output this shift: Total I/O In: 583.3 [I.V.:583.3] Out: 300 [Urine:300]  General appearance: alert, cooperative and mild distress GI: soft, non distended, non tender.  minimal gas in bag.  small amt clear output in bag.   Extremities: extremities normal, atraumatic, no cyanosis or edema  Lab Results:   Recent Labs  12/24/12 2030 12/24/12 2049  WBC 8.1  --   HGB 12.4 12.6  HCT 35.4* 37.0  PLT 196  --    BMET  Recent Labs  12/24/12 2049  NA 136  K 3.7  CL 97  GLUCOSE 118*  BUN 14  CREATININE 0.90   PT/INR No results found for this basename: LABPROT, INR,  in the last 72 hours ABG No results found for this basename: PHART, PCO2, PO2, HCO3,  in the last 72 hours  Studies/Results: Dg Abd Acute W/chest  12/24/2012  *RADIOLOGY REPORT*  Clinical Data: Constipation, nausea/vomiting, colostomy  ACUTE ABDOMEN SERIES (ABDOMEN 2 VIEW & CHEST 1 VIEW)  Comparison: 11/16/2012  Findings: Lungs are essentially clear.  No focal consolidation. No pleural effusion or pneumothorax.  Left chest power port.  The heart is normal in size.  Multiple mildly dilated loops of small bowel with possible thickened mucosal folds.  No evidence of free air on the lateral decubitus view.  Surgical sutures overlying the pelvis.  Visualized osseous structures are within normal limits.  IMPRESSION: No evidence of acute cardiopulmonary disease.  Multiple  dilated loops of small bowel with possible thickened mucosal folds, suggesting small bowel obstruction or enteritis.  No free air.   Original Report Authenticated By: Charline Bills, M.D.    Dg Abd Portable 2v  12/25/2012  *RADIOLOGY REPORT*  Clinical Data: Small bowel obstruction, follow-up  PORTABLE ABDOMEN - 2 VIEW  Comparison: Abdomen films of 12/24/2012  Findings: Supine and left lateral decubitus films of the abdomen show persistently dilated loops of small bowel consistent with persistent small bowel obstruction.  No free air is seen on the decubitus film.  No definite colonic bowel gas is noted.  IMPRESSION: Persistent small bowel obstruction.  No free air.   Original Report Authenticated By: Dwyane Dee, M.D.     Anti-infectives: Anti-infectives   None      Assessment/Plan: s/p * No surgery found * remain NPO x ice chips. Repeat plain films in AM NGT  LOS: 1 day    [facility] 12/25/2012

## 2012-12-25 NOTE — Progress Notes (Signed)
INITIAL NUTRITION ASSESSMENT  Pt meets criteria for severe MALNUTRITION in the context of acute illness as evidenced by <50% estimated energy intake in the past week with 6.5% weight loss in the past 1.5 weeks.   DOCUMENTATION CODES Per approved criteria  -Severe malnutrition in the context of acute illness or injury -Underweight   INTERVENTION: - If diet unable to be advanced in next 1-2 days, recommend initiation of nutrition support - Diet advancement per MD - Will continue to monitor   NUTRITION DIAGNOSIS: Inadequate oral intake related to inability to eat as evidenced by NPO.   Goal: Advance diet as tolerated to low fiber diet.   Monitor:  Weights, labs, diet advancement, BM  Reason for Assessment: Nutrition risk   46 y.o. female  Admitting Dx: Abdominal pain, nausea, vomiting, no output from ostomy  ASSESSMENT: Pt is is status post laparoscopic total abdominal colectomy, ileal pouch anal anastomosis, and diverting ileostomy. Pt has had to be admitted several times for small bowel obstruction. Met with pt who reports eating small frequent meals/snacks at home per advice of Dr. Donell Beers. Pt reports she was not able to eat anything for 2 days PTA r/t nausea, vomiting. Pt denies any nausea/vomiting today. Pt reports typically emptying out her ileostomy at home 3-4 times/day. Pt reports 2 years ago she weighed between 95-98 pounds, now weighs 72 pounds. Pt with rectal CA T3 N1 s/p chemoradiation. Pt found to have small bowel obstruction likely secondary to post-op adhesions.    Height: Ht Readings from Last 1 Encounters:  12/25/12 4\' 5"  (1.346 m)    Weight: Wt Readings from Last 1 Encounters:  12/25/12 72 lb 3.2 oz (32.75 kg)    Ideal Body Weight: 88 lb  % Ideal Body Weight: 82  Wt Readings from Last 10 Encounters:  12/25/12 72 lb 3.2 oz (32.75 kg)  12/16/12 77 lb (34.927 kg)  12/12/12 76 lb 4.8 oz (34.609 kg)  11/22/12 76 lb 6 oz (34.643 kg)  11/16/12 76 lb 0.9 oz  (34.5 kg)  11/14/12 78 lb 3.2 oz (35.471 kg)  10/30/12 79 lb (35.834 kg)  10/18/12 77 lb 12.8 oz (35.29 kg)  10/17/12 78 lb 8 oz (35.607 kg)  09/20/12 77 lb 3.2 oz (35.018 kg)    Usual Body Weight: 95-98 lb per pt  % Usual Body Weight: 73-76  BMI:  Body mass index is 18.08 kg/(m^2).  Estimated Nutritional Needs: Kcal: 1000-1150 Protein: 50-65g Fluid: 1-1.1L/day  Skin: Intact   Diet Order: NPO  EDUCATION NEEDS: -No education needs identified at this time   Intake/Output Summary (Last 24 hours) at 12/25/12 1256 Last data filed at 12/25/12 1000  Gross per 24 hour  Intake 894.67 ml  Output    300 ml  Net 594.67 ml    Last BM: 3/31  Labs:   Recent Labs Lab 12/24/12 2049  NA 136  K 3.7  CL 97  BUN 14  CREATININE 0.90  GLUCOSE 118*    CBG (last 3)  No results found for this basename: GLUCAP,  in the last 72 hours  Scheduled Meds: . enoxaparin (LOVENOX) injection  30 mg Subcutaneous QHS  . pantoprazole (PROTONIX) IV  40 mg Intravenous QHS    Continuous Infusions: . dextrose 5 % and 0.45 % NaCl with KCl 20 mEq/L 100 mL/hr at 12/25/12 1240    Past Medical History  Diagnosis Date  . Anemia   . Diarrhea   . Anxiety   . Bright red rectal bleeding 09/13/2011  .  History of chemotherapy     completed 09/2011   . Hx of radiation therapy 08/29/11 to 10/09/11    rectum  . GERD (gastroesophageal reflux disease)   . Ileostomy in place   . Cancer   . Rectal cancer     Past Surgical History  Procedure Laterality Date  . Tubal ligation    . Carpal tunnel release    . Colonoscopy  07/26/2011    Procedure: COLONOSCOPY;  Surgeon: Arlyce Harman, MD;  [facility];  Service: Endoscopy;  Laterality: N/A;  10:40  . Esophagogastroduodenoscopy  07/26/2011    Procedure: ESOPHAGOGASTRODUODENOSCOPY (EGD);  Surgeon: Arlyce Harman, MD;  [facility];  Service: Endoscopy;  Laterality: N/A;  . Esophagogastroduodenoscopy  12/27/2011    Procedure:  ESOPHAGOGASTRODUODENOSCOPY (EGD);  Surgeon: Shirley Friar, MD;  Location: [facility];  Service: Endoscopy;  Laterality: N/A;  . Laparoscopic colon resection  12/14/11    diverting ileostomy  . Portacath placement  02/21/2012    Procedure: INSERTION PORT-A-CATH;  Surgeon: [name], MD;  Location: [facility];  Service: General;  Laterality: N/A;  . Evaluation under anesthesia with anal fissurotomy N/A 11/06/2012    Procedure: Exam Under Anesthesia , Anal Dilation;  Surgeon: [name], MD;  Location: [facility];  Service: General;  Laterality: N/A;  Exam Under Anesthesia , Anal Dilation     Levon Hedger MS, RD, LDN [phone] Pager [phone] After Hours Pager

## 2012-12-25 NOTE — Progress Notes (Signed)
Called to room by patient states she is ready to try NGT placement again and is agreeable to having it. NGT placed, placement verified by auscultation by myself and Jodi Geralds, RN. Immediate output of bile.

## 2012-12-25 NOTE — Progress Notes (Signed)
Paged Dr. Donell Beers, patient states she is passing gas through ileostomy and refuses NGT placement at this time. Explained uses and process of inserting NGT to patient, but patient states she is scared and does not want right now.

## 2012-12-26 ENCOUNTER — Ambulatory Visit: Payer: BC Managed Care – PPO | Admitting: Physical Therapy

## 2012-12-26 NOTE — Progress Notes (Signed)
Patient ID: Renee Nicholson, female   DOB: [birth year], 47 y.o.   MRN: [id number]    Subjective: Abdominal pain resolved with NGT placement.  Now with gas and stool in bag.      Objective: Vital signs in last 24 hours: Temp:  [97.9 F (36.6 C)-99.7 F (37.6 C)] 97.9 F (36.6 C) (04/03 0643) Pulse Rate:  [71-94] 94 (04/03 0643) Resp:  [18] 18 (04/03 0643) BP: (113-156)/(71-82) 119/73 mmHg (04/03 0643) SpO2:  [94 %-100 %] 94 % (04/03 0643) Last BM Date: 12/26/12  Intake/Output from previous day: 04/02 0701 - 04/03 0700 In: 2583.3 [I.V.:2583.3] Out: 1950 [Urine:1150; Emesis/NG output:700; Stool:100] Intake/Output this shift: Total I/O In: -  Out: 600 [Urine:400; Stool:200]  General appearance: alert, cooperative and mild distress GI: soft, non distended, non tender.  Gas, stool in bag.   Extremities: extremities normal, atraumatic, no cyanosis or edema  Lab Results:   Recent Labs  12/24/12 2030 12/24/12 2049  WBC 8.1  --   HGB 12.4 12.6  HCT 35.4* 37.0  PLT 196  --    BMET  Recent Labs  12/24/12 2049  NA 136  K 3.7  CL 97  GLUCOSE 118*  BUN 14  CREATININE 0.90   PT/INR No results found for this basename: LABPROT, INR,  in the last 72 hours ABG No results found for this basename: PHART, PCO2, PO2, HCO3,  in the last 72 hours  Studies/Results: Dg Abd Acute W/chest  12/24/2012  *RADIOLOGY REPORT*  Clinical Data: Constipation, nausea/vomiting, colostomy  ACUTE ABDOMEN SERIES (ABDOMEN 2 VIEW & CHEST 1 VIEW)  Comparison: 11/16/2012  Findings: Lungs are essentially clear.  No focal consolidation. No pleural effusion or pneumothorax.  Left chest power port.  The heart is normal in size.  Multiple mildly dilated loops of small bowel with possible thickened mucosal folds.  No evidence of free air on the lateral decubitus view.  Surgical sutures overlying the pelvis.  Visualized osseous structures are within normal limits.  IMPRESSION: No evidence of acute cardiopulmonary  disease.  Multiple dilated loops of small bowel with possible thickened mucosal folds, suggesting small bowel obstruction or enteritis.  No free air.   Original Report Authenticated By: Charline Bills, M.D.    Dg Abd Portable 2v  12/25/2012  *RADIOLOGY REPORT*  Clinical Data: Small bowel obstruction, follow-up  PORTABLE ABDOMEN - 2 VIEW  Comparison: Abdomen films of 12/24/2012  Findings: Supine and left lateral decubitus films of the abdomen show persistently dilated loops of small bowel consistent with persistent small bowel obstruction.  No free air is seen on the decubitus film.  No definite colonic bowel gas is noted.  IMPRESSION: Persistent small bowel obstruction.  No free air.   Original Report Authenticated By: Dwyane Dee, M.D.     Anti-infectives: Anti-infectives   None      Assessment/Plan: s/p * No surgery found * remain NPO x ice chips. Advance diet. Home in AM if tolerates   LOS: 2 days    [facility] 12/26/2012

## 2012-12-27 MED ORDER — HEPARIN SOD (PORK) LOCK FLUSH 100 UNIT/ML IV SOLN
500.0000 [IU] | INTRAVENOUS | Status: AC | PRN
  Administered 2012-12-27: 500 [IU]

## 2012-12-27 NOTE — Discharge Summary (Signed)
Physician Discharge Summary  Patient ID: Renee Nicholson MRN: [id number] DOB/AGE: 01/28/1966 47 y.o.  Admit date: 12/24/2012 Discharge date: 12/27/2012  Admission Diagnoses: SBO Failure to thrive   Discharge Diagnoses:  Same  Discharged Condition: stable  Hospital Course:  Pt admitted to hospital with symptoms of SBO.  She was placed on bowel rest and IVF overnight.  She continued to have pain and no ileostomy output.  An NGT was placed.  Within 18 hours, she was having flatus and stool in ileostomy bag and resolution of abdominal pain.  Her diet was able to be advanced, and she was discharged to home in stable condition.    Consults: None  Significant Diagnostic Studies: radiology: KUB: SBO  Treatments: IV hydration, analgesia: Morphine and NGT  Discharge Exam: Blood pressure 137/92, pulse 70, temperature 96.2 F (35.7 C), temperature source Axillary, resp. rate 18, height 4\' 5"  (1.346 m), weight 72 lb 3.2 oz (32.75 kg), last menstrual period 02/20/2012, SpO2 100.00%. General appearance: alert, cooperative and no distress Resp: no distress Cardio: regular rate and rhythm GI: soft, non-tender; bowel sounds normal; no masses,  no organomegaly, gas, stool in ostomy appliance. Extremities: extremities normal, atraumatic, no cyanosis or edema  Disposition: 01-Home or Self Care  Discharge Orders   Future Appointments Provider Department Dept Phone   12/30/2012 11:45 AM Theressa Millard, PT [facility] [phone]   01/02/2013 11:45 AM Theressa Millard, PT [facility] [phone]   01/06/2013 11:45 AM Theressa Millard, PT [facility] [phone]   01/07/2013 11:00 AM [facility] [phone]   01/09/2013 11:45 AM Theressa Millard, PT [facility] [phone]   01/13/2013 11:45 AM Theressa Millard, PT [facility] [phone]   03/20/2013 11:00 AM Krista Blue [facility] [phone]   03/20/2013 11:30 AM Victorino December, MD [facility] [phone]   Future Orders Complete By Expires     Call MD for:  difficulty breathing, headache or visual disturbances  As directed     Call MD for:  persistant nausea and vomiting  As directed     Call MD for:  redness, tenderness, or signs of infection (pain, swelling, redness, odor or green/yellow discharge around incision site)  As directed     Call MD for:  severe uncontrolled pain  As directed     Call MD for:  temperature >100.4  As directed     Diet - low sodium heart healthy  As directed     Increase activity slowly  As directed         Medication List    TAKE these medications       acetaminophen 325 MG tablet  Commonly known as:  TYLENOL  Take 650 mg by mouth every 6 (six) hours as needed. Headache/pain     B-complex with vitamin C tablet  Take 1 tablet by mouth daily.     diphenoxylate-atropine 2.5-0.025 MG per tablet  Commonly known as:  LOMOTIL  Take 1 tablet by mouth 4 (four) times daily as needed for diarrhea or loose stools.     ferrous sulfate 325 (65 FE) MG tablet  Take 325 mg by mouth daily with breakfast.     lactose free nutrition Liqd  Take 237 mLs by mouth 2 (two) times daily.     multivitamin tablet  Take 1 tablet by mouth every morning.     pantoprazole  40 MG tablet  Commonly known as:  PROTONIX  Take 40 mg by mouth every morning.     potassium chloride SA 20 MEQ tablet  Commonly known as:  K-DUR,KLOR-CON  Take 20 mEq by mouth every morning.     Simethicone 125 MG Caps  Take 1 capsule by mouth 4 (four) times daily as needed (for gas or upset stomach).           Follow-up Information   Follow up with [facility], [place]. Schedule an appointment as soon as possible for a visit in 3 weeks.   Contact information:   [address] [place] [address] [phone]       Signed: [name] 12/27/2012, 8:43 AM

## 2012-12-30 ENCOUNTER — Telehealth (INDEPENDENT_AMBULATORY_CARE_PROVIDER_SITE_OTHER): Payer: Self-pay | Admitting: *Deleted

## 2012-12-30 ENCOUNTER — Ambulatory Visit: Payer: BC Managed Care – PPO | Attending: General Surgery | Admitting: Physical Therapy

## 2012-12-30 DIAGNOSIS — M629 Disorder of muscle, unspecified: Secondary | ICD-10-CM | POA: Insufficient documentation

## 2012-12-30 DIAGNOSIS — IMO0001 Reserved for inherently not codable concepts without codable children: Secondary | ICD-10-CM | POA: Insufficient documentation

## 2012-12-30 DIAGNOSIS — M2420 Disorder of ligament, unspecified site: Secondary | ICD-10-CM | POA: Insufficient documentation

## 2012-12-30 NOTE — Telephone Encounter (Signed)
Husband called to state that patient is having similar symptoms to those patient had when dx with SBO.  Asked husband if patient is taking something to help move her bowels.  Patient states she is taking something for nausea, pain and a white bottle.  Husband unable to tell me what is in the white bottle.  Patient states he thinks he may take wife to ED since he can not immediately speak with Donell Beers MD.  Encouraged husband to make sure patient is taking something like Milk a Mag but husband continues talking about taking patient to ED.

## 2012-12-31 ENCOUNTER — Inpatient Hospital Stay (HOSPITAL_COMMUNITY)
Admission: EM | Admit: 2012-12-31 | Discharge: 2013-01-17 | DRG: 581 | Disposition: A | Discharge status: Rehabilitation Facility | Payer: BC Managed Care – PPO | Attending: General Surgery | Admitting: General Surgery

## 2012-12-31 ENCOUNTER — Emergency Department (HOSPITAL_COMMUNITY): Payer: BC Managed Care – PPO

## 2012-12-31 ENCOUNTER — Encounter (HOSPITAL_COMMUNITY): Payer: Self-pay | Admitting: *Deleted

## 2012-12-31 DIAGNOSIS — E46 Unspecified protein-calorie malnutrition: Secondary | ICD-10-CM | POA: Diagnosis present

## 2012-12-31 DIAGNOSIS — R4182 Altered mental status, unspecified: Secondary | ICD-10-CM | POA: Diagnosis present

## 2012-12-31 DIAGNOSIS — Z930 Tracheostomy status: Secondary | ICD-10-CM

## 2012-12-31 DIAGNOSIS — E876 Hypokalemia: Secondary | ICD-10-CM

## 2012-12-31 DIAGNOSIS — IMO0002 Reserved for concepts with insufficient information to code with codable children: Secondary | ICD-10-CM

## 2012-12-31 DIAGNOSIS — Z85048 Personal history of other malignant neoplasm of rectum, rectosigmoid junction, and anus: Secondary | ICD-10-CM

## 2012-12-31 DIAGNOSIS — Z923 Personal history of irradiation: Secondary | ICD-10-CM

## 2012-12-31 DIAGNOSIS — R579 Shock, unspecified: Secondary | ICD-10-CM

## 2012-12-31 DIAGNOSIS — K56609 Unspecified intestinal obstruction, unspecified as to partial versus complete obstruction: Secondary | ICD-10-CM

## 2012-12-31 DIAGNOSIS — Y849 Medical procedure, unspecified as the cause of abnormal reaction of the patient, or of later complication, without mention of misadventure at the time of the procedure: Secondary | ICD-10-CM | POA: Diagnosis present

## 2012-12-31 DIAGNOSIS — C20 Malignant neoplasm of rectum: Secondary | ICD-10-CM

## 2012-12-31 DIAGNOSIS — R6521 Severe sepsis with septic shock: Secondary | ICD-10-CM | POA: Diagnosis present

## 2012-12-31 DIAGNOSIS — Z432 Encounter for attention to ileostomy: Secondary | ICD-10-CM

## 2012-12-31 DIAGNOSIS — E079 Disorder of thyroid, unspecified: Secondary | ICD-10-CM

## 2012-12-31 DIAGNOSIS — R6510 Systemic inflammatory response syndrome (SIRS) of non-infectious origin without acute organ dysfunction: Secondary | ICD-10-CM

## 2012-12-31 DIAGNOSIS — K562 Volvulus: Secondary | ICD-10-CM | POA: Diagnosis present

## 2012-12-31 DIAGNOSIS — A419 Sepsis, unspecified organism: Secondary | ICD-10-CM | POA: Diagnosis present

## 2012-12-31 DIAGNOSIS — Z9889 Other specified postprocedural states: Secondary | ICD-10-CM

## 2012-12-31 DIAGNOSIS — K660 Peritoneal adhesions (postprocedural) (postinfection): Secondary | ICD-10-CM | POA: Diagnosis present

## 2012-12-31 DIAGNOSIS — J9600 Acute respiratory failure, unspecified whether with hypoxia or hypercapnia: Secondary | ICD-10-CM | POA: Diagnosis not present

## 2012-12-31 DIAGNOSIS — R198 Other specified symptoms and signs involving the digestive system and abdomen: Secondary | ICD-10-CM

## 2012-12-31 DIAGNOSIS — Z859 Personal history of malignant neoplasm, unspecified: Secondary | ICD-10-CM

## 2012-12-31 DIAGNOSIS — R000 Tachycardia, unspecified: Secondary | ICD-10-CM | POA: Diagnosis present

## 2012-12-31 LAB — CBC WITH DIFFERENTIAL/PLATELET
Hemoglobin: 13.2 g/dL (ref 12.0–15.0)
Lymphocytes Relative: 7 % — ABNORMAL LOW (ref 12–46)
Lymphs Abs: 0.9 10*3/uL (ref 0.7–4.0)
Neutrophils Relative %: 86 % — ABNORMAL HIGH (ref 43–77)
Platelets: 261 10*3/uL (ref 150–400)
RBC: 4.42 MIL/uL (ref 3.87–5.11)
WBC: 12.7 10*3/uL — ABNORMAL HIGH (ref 4.0–10.5)

## 2012-12-31 LAB — COMPREHENSIVE METABOLIC PANEL
ALT: 18 U/L (ref 0–35)
Alkaline Phosphatase: 91 U/L (ref 39–117)
CO2: 27 mEq/L (ref 19–32)
Chloride: 93 mEq/L — ABNORMAL LOW (ref 96–112)
GFR calc Af Amer: >90 mL/min (ref >90)
GFR calc non Af Amer: >90 mL/min (ref >90)
Glucose, Bld: 114 mg/dL — ABNORMAL HIGH (ref 70–99)
Potassium: 3.5 mEq/L (ref 3.5–5.1)
Sodium: 134 mEq/L — ABNORMAL LOW (ref 135–145)
Total Bilirubin: 1 mg/dL (ref 0.3–1.2)

## 2012-12-31 MED ORDER — DIPHENHYDRAMINE HCL 50 MG/ML IJ SOLN: 12.5000 mg | Freq: Four times a day (QID) | INTRAMUSCULAR | Status: DC | PRN

## 2012-12-31 MED ORDER — PANTOPRAZOLE SODIUM 40 MG IV SOLR
40.0000 mg | Freq: Two times a day (BID) | INTRAVENOUS | Status: DC
  Administered 2012-12-31 – 2013-01-08 (×16): 40 mg via INTRAVENOUS
  Filled 2012-12-31 (×21): qty 40

## 2012-12-31 MED ORDER — HYDROMORPHONE HCL PF 1 MG/ML IJ SOLN
0.5000 mg | INTRAMUSCULAR | Status: DC | PRN
  Administered 2012-12-31: 1 mg via INTRAVENOUS
  Administered 2012-12-31: 0.5 mg via INTRAVENOUS
  Administered 2012-12-31 – 2013-01-01 (×2): 1 mg via INTRAVENOUS
  Filled 2012-12-31 (×4): qty 1

## 2012-12-31 MED ORDER — ONDANSETRON HCL 4 MG/2ML IJ SOLN
4.0000 mg | Freq: Once | INTRAMUSCULAR | Status: AC
  Administered 2012-12-31: 4 mg via INTRAVENOUS
  Filled 2012-12-31 (×2): qty 2

## 2012-12-31 MED ORDER — PROMETHAZINE HCL 25 MG/ML IJ SOLN
6.2500 mg | Freq: Four times a day (QID) | INTRAMUSCULAR | Status: DC | PRN
  Administered 2012-12-31: 12.5 mg via INTRAVENOUS
  Administered 2013-01-01: 6.25 mg via INTRAVENOUS
  Filled 2012-12-31 (×2): qty 1

## 2012-12-31 MED ORDER — ONDANSETRON HCL 4 MG/2ML IJ SOLN
4.0000 mg | Freq: Four times a day (QID) | INTRAMUSCULAR | Status: DC | PRN
  Administered 2013-01-01: 4 mg via INTRAVENOUS
  Filled 2012-12-31: qty 2

## 2012-12-31 MED ORDER — DIPHENHYDRAMINE HCL 12.5 MG/5ML PO ELIX
12.5000 mg | ORAL_SOLUTION | Freq: Four times a day (QID) | ORAL | Status: DC | PRN
  Filled 2012-12-31: qty 5

## 2012-12-31 MED ORDER — SODIUM CHLORIDE 0.9 % IV BOLUS (SEPSIS)
1000.0000 mL | Freq: Once | INTRAVENOUS | Status: AC
  Administered 2012-12-31: 1000 mL via INTRAVENOUS

## 2012-12-31 MED ORDER — ENOXAPARIN SODIUM 40 MG/0.4ML ~~LOC~~ SOLN
40.0000 mg | SUBCUTANEOUS | Status: DC
  Administered 2012-12-31: 40 mg via SUBCUTANEOUS
  Filled 2012-12-31 (×2): qty 0.4

## 2012-12-31 MED ORDER — POTASSIUM CHLORIDE IN NACL 20-0.9 MEQ/L-% IV SOLN
INTRAVENOUS | Status: DC
  Administered 2012-12-31: 1 mL via INTRAVENOUS
  Administered 2013-01-01: 02:00:00 via INTRAVENOUS
  Administered 2013-01-01: 100 mL/h via INTRAVENOUS
  Filled 2012-12-31 (×5): qty 1000

## 2012-12-31 MED ORDER — MORPHINE SULFATE 4 MG/ML IJ SOLN
4.0000 mg | Freq: Once | INTRAMUSCULAR | Status: AC
  Administered 2012-12-31: 4 mg via INTRAVENOUS
  Filled 2012-12-31: qty 1

## 2012-12-31 MED ORDER — SODIUM CHLORIDE 0.9 % IV SOLN
INTRAVENOUS | Status: DC
  Administered 2012-12-31: 14:00:00 via INTRAVENOUS
  Administered 2013-01-01: 999 mL/h via INTRAVENOUS

## 2012-12-31 NOTE — ED Notes (Signed)
Report called to [facility] nurse.

## 2012-12-31 NOTE — ED Notes (Signed)
Patient given pain and nausea medication.

## 2012-12-31 NOTE — ED Notes (Signed)
Attempted to call report to 6N.

## 2012-12-31 NOTE — H&P (Signed)
Patient seen.  Agree with Ms. White's note.

## 2012-12-31 NOTE — ED Notes (Signed)
Pt has Hx rectal Ca. Recent d/c from [facility] after persistent SBO r/t ileostomy surgical adhesions. C/o R sided abd pain. Called PCP yesterday. Per chart note, suggestions were made for pt to try milk of mag, but husband continued to state he would bring pt to ED.

## 2012-12-31 NOTE — H&P (Signed)
Renee Nicholson is an 47 y.o. female.   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 Complicated by stricture requiring dilatation. This is now the 4th episode of small bowel obstruction requiring admission(she was discharged on 4/4). Patient presents with 24 hr of minimal ostomy output, abdominal and back pain, nausea and emesis. AXR in ER shows findings consistent with SBO.  She is requesting care by Dr. Donell Beers.   Past Medical History  Diagnosis Date  . Anemia   . Diarrhea   . Anxiety   . Bright red rectal bleeding 09/13/2011  . History of chemotherapy     completed 09/2011   . Hx of radiation therapy 08/29/11 to 10/09/11    rectum  . GERD (gastroesophageal reflux disease)   . Ileostomy in place   . Cancer   . Rectal cancer     Past Surgical History  Procedure Laterality Date  . Tubal ligation    . Carpal tunnel release    . Colonoscopy  07/26/2011    Procedure: COLONOSCOPY;  Surgeon: Arlyce Harman, MD;  [facility];  Service: Endoscopy;  Laterality: N/A;  10:40  . Esophagogastroduodenoscopy  07/26/2011    Procedure: ESOPHAGOGASTRODUODENOSCOPY (EGD);  Surgeon: Arlyce Harman, MD;  [facility];  Service: Endoscopy;  Laterality: N/A;  . Esophagogastroduodenoscopy  12/27/2011    Procedure: ESOPHAGOGASTRODUODENOSCOPY (EGD);  Surgeon: Shirley Friar, MD;  Location: [facility];  Service: Endoscopy;  Laterality: N/A;  . Laparoscopic colon resection  12/14/11    diverting ileostomy  . Portacath placement  02/21/2012    Procedure: INSERTION PORT-A-CATH;  Surgeon: [name], MD;  Location: [facility];  Service: General;  Laterality: N/A;  . Evaluation under anesthesia with anal fissurotomy N/A 11/06/2012    Procedure: Exam Under Anesthesia , Anal Dilation;  Surgeon: [name], MD;  Location: [facility];   Service: General;  Laterality: N/A;  Exam Under Anesthesia , Anal Dilation    Family History  Problem Relation Age of Onset  . Diabetes Mother   . Colon cancer Neg Hx   . Cancer Brother 35    rectal cancer lives in [place]   Social History:  reports that she has never smoked. She has never used smokeless tobacco. She reports that she does not drink alcohol or use illicit drugs.  Allergies: No Known Allergies   (Not in a hospital admission)  Results for orders placed during the hospital encounter of 12/31/12 (from the past 48 hour(s))  CBC WITH DIFFERENTIAL     Status: Abnormal   Collection Time    12/31/12 12:25 PM      Result Value Range   WBC 12.7 (*) 4.0 - 10.5 K/uL   RBC 4.42  3.87 - 5.11 MIL/uL   Hemoglobin 13.2  12.0 - 15.0 g/dL   HCT 16.1  09.6 - 04.5 %   MCV 83.5  78.0 - 100.0 fL   MCH 29.9  26.0 - 34.0 pg   MCHC 35.8  30.0 - 36.0 g/dL   RDW 40.9  81.1 - 91.4 %   Platelets 261  150 - 400 K/uL   Neutrophils Relative 86 (*) 43 - 77 %   Neutro Abs 11.0 (*) 1.7 - 7.7 K/uL   Lymphocytes Relative 7 (*) 12 - 46 %   Lymphs Abs 0.9  0.7 - 4.0 K/uL  Monocytes Relative 7  3 - 12 %   Monocytes Absolute 0.9  0.1 - 1.0 K/uL   Eosinophils Relative 0  0 - 5 %   Eosinophils Absolute 0.0  0.0 - 0.7 K/uL   Basophils Relative 0  0 - 1 %   Basophils Absolute 0.0  0.0 - 0.1 K/uL  COMPREHENSIVE METABOLIC PANEL     Status: Abnormal   Collection Time    12/31/12 12:25 PM      Result Value Range   Sodium 134 (*) 135 - 145 mEq/L   Potassium 3.5  3.5 - 5.1 mEq/L   Chloride 93 (*) 96 - 112 mEq/L   CO2 27  19 - 32 mEq/L   Glucose, Bld 114 (*) 70 - 99 mg/dL   BUN 15  6 - 23 mg/dL   Creatinine, Ser 1.61  0.50 - 1.10 mg/dL   Calcium 9.6  8.4 - 09.6 mg/dL   Total Protein 7.5  6.0 - 8.3 g/dL   Albumin 4.1  3.5 - 5.2 g/dL   AST 19  0 - 37 U/L   ALT 18  0 - 35 U/L   Alkaline Phosphatase 91  39 - 117 U/L   Total Bilirubin 1.0  0.3 - 1.2 mg/dL   GFR calc non Af Amer >90  >90 mL/min   GFR  calc Af Amer >90  >90 mL/min   Comment:            The eGFR has been calculated     using the CKD EPI equation.     This calculation has not been     validated in all clinical     situations.     eGFR's persistently     <90 mL/min signify     possible Chronic Kidney Disease.  LIPASE, BLOOD     Status: None   Collection Time    12/31/12 12:25 PM      Result Value Range   Lipase 48  11 - 59 U/L   Dg Abd Acute W/chest  12/31/2012  *RADIOLOGY REPORT*  Clinical Data: Abdominal pain.  Nausea and vomiting.  Prior history of rectal cancer with creation of a J pouch and ileoanal anastomosis.  ACUTE ABDOMEN SERIES (ABDOMEN 2 VIEW & CHEST 1 VIEW)  Comparison: Acute abdomen series 12/24/2012.  Two-view abdomen x- rays 12/25/2012, 11/16/2012.  Findings: Progressive distention of multiple small bowel loops since the examination 6 days ago, and there are several more dilated small bowel loops currently.  Gas is present within the rectum.  No evidence of free intraperitoneal air on the erect image.  Air-fluid levels throughout the dilated small bowel.  Air- fluid level within the stomach.  Phleboliths in the pelvis.  No visible opaque urinary tract calculi.  Regional skeleton unremarkable.  Cardiomediastinal silhouette unremarkable, unchanged.  Lungs clear. Bronchovascular markings normal.  Pulmonary vascularity normal.  No pneumothorax.  No pleural effusions.  Apparent nodular opacity projected over the left lung base is felt to represent the left nipple shadow.  Left subclavian Port-A-Cath tip projects over the upper SVC.  IMPRESSION:  1.  Worsening partial small bowel obstruction.  No free intraperitoneal air. 2.  No acute cardiopulmonary disease.   Original Report Authenticated By: Hulan Saas, M.D.     Review of Systems  Constitutional: Negative.   HENT: Negative.   Eyes: Negative.   Respiratory: Negative.   Cardiovascular: Negative.   Gastrointestinal: Positive for nausea, vomiting and abdominal  pain.  Decreased ostomy output  Genitourinary: Negative.   Musculoskeletal: Negative.   Skin: Negative.   Neurological: Negative.   Endo/Heme/Allergies: Negative.   Psychiatric/Behavioral: Negative.     Blood pressure 163/97, pulse 93, temperature 98.4 F (36.9 C), temperature source Oral, resp. rate 16, last menstrual period 02/20/2012, SpO2 99.00%. Physical Exam  Constitutional: She is oriented to person, place, and time. No distress.  cachetic  HENT:  Head: Normocephalic and atraumatic.  Eyes: Conjunctivae are normal. Pupils are equal, round, and reactive to light.  Neck: Normal range of motion. Neck supple.  Cardiovascular: Normal rate and regular rhythm.   Respiratory: Effort normal and breath sounds normal.  GI: She exhibits distension (mild). There is tenderness.  Faint bowel sounds  Ostomy with yellowish, clear output  Genitourinary:  deferred  Musculoskeletal: Normal range of motion.  Neurological: She is alert and oriented to person, place, and time.  Skin: Skin is warm and dry.  Psychiatric: She has a normal mood and affect. Her behavior is normal.     Assessment/Plan 1. Recurrent PSBO: the patient will be admitted, placed on bowel rest, IVFs, NGT if persistent n/v, and be transferred to [facility] to be managed by Dr. Donell Beers.  PRN IV meds for pain, nausea, and vomiting.  Aubriel Khanna 12/31/2012, 1:16 PM

## 2012-12-31 NOTE — ED Notes (Signed)
pts power port accessed. Blood return verified and port flushed with saline without difficulty. Blood collected and sent to lab for processing. Port Site dated, times and saline locked. pts primary RN aware.

## 2012-12-31 NOTE — ED Notes (Signed)
Waiting for call back from nurse from [address] [place]

## 2012-12-31 NOTE — ED Notes (Signed)
Pt was admitted to hospital last week and d/ced on Friday, was here for SBO, sts shes had n/v since yesterday, no bm and not passing gas since saturday

## 2012-12-31 NOTE — ED Provider Notes (Signed)
History     CSN: [id number]  Arrival date & time 12/31/12  1106   First MD Initiated Contact with Patient 12/31/12 1141      Chief Complaint  Patient presents with  . Abdominal Pain    (Consider location/radiation/quality/duration/timing/severity/associated sxs/prior treatment) Patient is a 47 y.o. female presenting with abdominal pain. The history is provided by the patient and the spouse.  Abdominal Pain  patient here with 24-hour history of abdominal pain characterized as sharp with associated decreased output into her ostomy bag. She has had nausea with bilious vomiting. Denies any fever. Was recently admitted to the hospital for bowel obstruction and these symptoms feel like this. Called her Dr. yesterday and pain medications were called in but they have not helped. Denies any urinary symptoms at this time. No other treatment used prior to arrival  Past Medical History  Diagnosis Date  . Anemia   . Diarrhea   . Anxiety   . Bright red rectal bleeding 09/13/2011  . History of chemotherapy     completed 09/2011   . Hx of radiation therapy 08/29/11 to 10/09/11    rectum  . GERD (gastroesophageal reflux disease)   . Ileostomy in place   . Cancer   . Rectal cancer     Past Surgical History  Procedure Laterality Date  . Tubal ligation    . Carpal tunnel release    . Colonoscopy  07/26/2011    Procedure: COLONOSCOPY;  Surgeon: Arlyce Harman, MD;  [facility];  Service: Endoscopy;  Laterality: N/A;  10:40  . Esophagogastroduodenoscopy  07/26/2011    Procedure: ESOPHAGOGASTRODUODENOSCOPY (EGD);  Surgeon: Arlyce Harman, MD;  [facility];  Service: Endoscopy;  Laterality: N/A;  . Esophagogastroduodenoscopy  12/27/2011    Procedure: ESOPHAGOGASTRODUODENOSCOPY (EGD);  Surgeon: Shirley Friar, MD;  Location: [facility];  Service: Endoscopy;  Laterality: N/A;  . Laparoscopic colon resection  12/14/11    diverting ileostomy  . Portacath placement   02/21/2012    Procedure: INSERTION PORT-A-CATH;  Surgeon: [name], MD;  Location: [facility];  Service: General;  Laterality: N/A;  . Evaluation under anesthesia with anal fissurotomy N/A 11/06/2012    Procedure: Exam Under Anesthesia , Anal Dilation;  Surgeon: [name], MD;  Location: [facility];  Service: General;  Laterality: N/A;  Exam Under Anesthesia , Anal Dilation    Family History  Problem Relation Age of Onset  . Diabetes Mother   . Colon cancer Neg Hx   . Cancer Brother 35    rectal cancer lives in [place]    History  Substance Use Topics  . Smoking status: Never Smoker   . Smokeless tobacco: Never Used  . Alcohol Use: No    OB History   Grav Para Term Preterm Abortions TAB SAB Ect Mult Living                  Review of Systems  Gastrointestinal: Positive for abdominal pain.  All other systems reviewed and are negative.    Allergies  Review of patient's allergies indicates no known allergies.  Home Medications   Current Outpatient Rx  Name  Route  Sig  Dispense  Refill  . B Complex-C (B-COMPLEX WITH VITAMIN C) tablet   Oral   Take 1 tablet by mouth daily.         . ferrous sulfate 325 (65 FE) MG tablet   Oral   Take 325 mg by mouth daily with breakfast.         .  lactose free nutrition (BOOST) LIQD   Oral   Take 237 mLs by mouth 2 (two) times daily.         . Multiple Vitamin (MULTIVITAMIN) tablet   Oral   Take 1 tablet by mouth every morning.          . pantoprazole (PROTONIX) 40 MG tablet   Oral   Take 40 mg by mouth every morning.          . potassium chloride SA (K-DUR,KLOR-CON) 20 MEQ tablet   Oral   Take 20 mEq by mouth every morning.         . Simethicone 125 MG CAPS   Oral   Take 1 capsule by mouth 4 (four) times daily as needed (for gas or upset stomach).            BP 163/97  Pulse 93  Temp(Src) 98.4 F (36.9 C) (Oral)  Resp 16  SpO2 99%  LMP 02/20/2012  Physical Exam  Nursing note and vitals  reviewed. Constitutional: She is oriented to person, place, and time. She appears well-developed and well-nourished.  Non-toxic appearance. No distress.  HENT:  Head: Normocephalic and atraumatic.  Eyes: Conjunctivae, EOM and lids are normal. Pupils are equal, round, and reactive to light.  Neck: Normal range of motion. Neck supple. No tracheal deviation present. No mass present.  Cardiovascular: Normal rate, regular rhythm and normal heart sounds.  Exam reveals no gallop.   No murmur heard. Pulmonary/Chest: Effort normal and breath sounds normal. No stridor. No respiratory distress. She has no decreased breath sounds. She has no wheezes. She has no rhonchi. She has no rales.  Abdominal: Soft. Normal appearance and bowel sounds are normal. She exhibits distension. There is generalized tenderness. There is no rebound and no CVA tenderness.    Musculoskeletal: Normal range of motion. She exhibits no edema and no tenderness.  Neurological: She is alert and oriented to person, place, and time. She has normal strength. No cranial nerve deficit or sensory deficit. GCS eye subscore is 4. GCS verbal subscore is 5. GCS motor subscore is 6.  Skin: Skin is warm and dry. No abrasion and no rash noted.  Psychiatric: She has a normal mood and affect. Her speech is normal and behavior is normal.    ED Course  Procedures (including critical care time)  Labs Reviewed  CBC WITH DIFFERENTIAL  COMPREHENSIVE METABOLIC PANEL  LIPASE, BLOOD   No results found.   No diagnosis found.    MDM  Spoke with general surgery, will come see patient        Toy Baker, MD 12/31/12 1254

## 2012-12-31 NOTE — Progress Notes (Signed)
Pt arrived to floor at this time via stretcher accompanied by [facility].  Pt has no s/s of any acute distress.  Antiemetic given prior to transfer per reporting nurse.  Pt states, "some nausea".  Pt drowsy at this time. Call bell in reach.

## 2013-01-01 ENCOUNTER — Inpatient Hospital Stay (HOSPITAL_COMMUNITY): Payer: BC Managed Care – PPO

## 2013-01-01 DIAGNOSIS — K56609 Unspecified intestinal obstruction, unspecified as to partial versus complete obstruction: Secondary | ICD-10-CM

## 2013-01-01 DIAGNOSIS — R579 Shock, unspecified: Secondary | ICD-10-CM | POA: Diagnosis present

## 2013-01-01 DIAGNOSIS — R6510 Systemic inflammatory response syndrome (SIRS) of non-infectious origin without acute organ dysfunction: Secondary | ICD-10-CM | POA: Diagnosis present

## 2013-01-01 LAB — POCT I-STAT 3, ART BLOOD GAS (G3+)
O2 Saturation: 94 %
TCO2: 10 mmol/L (ref 0–100)
pCO2 arterial: 16.9 mmHg — CL (ref 35.0–45.0)

## 2013-01-01 LAB — PROTIME-INR
INR: 3.33 — ABNORMAL HIGH (ref 0.00–1.49)
Prothrombin Time: 31.9 seconds — ABNORMAL HIGH (ref 11.6–15.2)

## 2013-01-01 LAB — CBC
MCHC: 35.6 g/dL (ref 30.0–36.0)
Platelets: 169 10*3/uL (ref 150–400)
RDW: 13.5 % (ref 11.5–15.5)

## 2013-01-01 LAB — ABO/RH: ABO/RH(D): B POS

## 2013-01-01 LAB — CBC WITH DIFFERENTIAL/PLATELET
Basophils Relative: 0 % (ref 0–1)
Eosinophils Relative: 3 % (ref 0–5)
HCT: 31.8 % — ABNORMAL LOW (ref 36.0–46.0)
Hemoglobin: 11.2 g/dL — ABNORMAL LOW (ref 12.0–15.0)
Lymphs Abs: 0.4 10*3/uL — ABNORMAL LOW (ref 0.7–4.0)
MCH: 29.2 pg (ref 26.0–34.0)
MCV: 83 fL (ref 78.0–100.0)
Monocytes Absolute: 0.1 10*3/uL (ref 0.1–1.0)
Monocytes Relative: 4 % (ref 3–12)
Neutro Abs: 1.5 10*3/uL — ABNORMAL LOW (ref 1.7–7.7)
Platelets: 155 10*3/uL (ref 150–400)
RBC: 3.83 MIL/uL — ABNORMAL LOW (ref 3.87–5.11)
WBC: 2.1 10*3/uL — ABNORMAL LOW (ref 4.0–10.5)

## 2013-01-01 LAB — URINALYSIS, ROUTINE W REFLEX MICROSCOPIC
Bilirubin Urine: NEGATIVE
Glucose, UA: NEGATIVE mg/dL
Ketones, ur: NEGATIVE mg/dL
Specific Gravity, Urine: 1.02 (ref 1.005–1.030)
pH: 5 (ref 5.0–8.0)

## 2013-01-01 LAB — COMPREHENSIVE METABOLIC PANEL
ALT: 12 U/L (ref 0–35)
Albumin: 1.9 g/dL — ABNORMAL LOW (ref 3.5–5.2)
Albumin: 1.8 g/dL — ABNORMAL LOW (ref 3.5–5.2)
Alkaline Phosphatase: 33 U/L — ABNORMAL LOW (ref 39–117)
GFR calc non Af Amer: >90 mL/min (ref >90)
Glucose, Bld: 143 mg/dL — ABNORMAL HIGH (ref 70–99)
Potassium: 3.8 mEq/L (ref 3.5–5.1)
Potassium: 3 mEq/L — ABNORMAL LOW (ref 3.5–5.1)
Sodium: 140 mEq/L (ref 135–145)
Sodium: 142 mEq/L (ref 135–145)
Total Bilirubin: 0.4 mg/dL (ref 0.3–1.2)
Total Protein: 4.1 g/dL — ABNORMAL LOW (ref 6.0–8.3)

## 2013-01-01 LAB — LIPASE, BLOOD: Lipase: 19 U/L (ref 11–59)

## 2013-01-01 LAB — URINE MICROSCOPIC-ADD ON

## 2013-01-01 LAB — FIBRINOGEN: Fibrinogen: 480 mg/dL — ABNORMAL HIGH (ref 204–475)

## 2013-01-01 LAB — TROPONIN I: Troponin I: <0.3 ng/mL (ref <0.30)

## 2013-01-01 LAB — CARBOXYHEMOGLOBIN
Methemoglobin: 0.9 % (ref 0.0–1.5)
O2 Saturation: 53.9 %
Total hemoglobin: 10.8 g/dL — ABNORMAL LOW (ref 12.0–16.0)

## 2013-01-01 LAB — GLUCOSE, CAPILLARY: Glucose-Capillary: 94 mg/dL (ref 70–99)

## 2013-01-01 MED ORDER — BIOTENE DRY MOUTH MT LIQD
15.0000 mL | Freq: Two times a day (BID) | OROMUCOSAL | Status: DC
  Administered 2013-01-01 – 2013-01-17 (×32): 15 mL via OROMUCOSAL

## 2013-01-01 MED ORDER — IOHEXOL 300 MG/ML  SOLN
80.0000 mL | Freq: Once | INTRAMUSCULAR | Status: AC | PRN
  Administered 2013-01-01: 80 mL via INTRAVENOUS

## 2013-01-01 MED ORDER — CHLORHEXIDINE GLUCONATE 0.12 % MT SOLN
15.0000 mL | Freq: Two times a day (BID) | OROMUCOSAL | Status: DC
  Administered 2013-01-01 – 2013-01-03 (×5): 15 mL via OROMUCOSAL
  Filled 2013-01-01 (×5): qty 15

## 2013-01-01 MED ORDER — SODIUM CHLORIDE 0.9 % IV SOLN
1.0000 g | INTRAVENOUS | Status: AC
  Administered 2013-01-01: 1 g via INTRAVENOUS
  Filled 2013-01-01: qty 1

## 2013-01-01 MED ORDER — SODIUM CHLORIDE 0.9 % IV SOLN
INTRAVENOUS | Status: DC
  Administered 2013-01-01: 13:00:00 via INTRAVENOUS

## 2013-01-01 MED ORDER — ENOXAPARIN SODIUM 30 MG/0.3ML ~~LOC~~ SOLN
30.0000 mg | SUBCUTANEOUS | Status: DC
  Filled 2013-01-01: qty 0.3

## 2013-01-01 MED ORDER — INSULIN ASPART 100 UNIT/ML ~~LOC~~ SOLN
0.0000 [IU] | SUBCUTANEOUS | Status: DC
  Administered 2013-01-02: 5 [IU] via SUBCUTANEOUS
  Administered 2013-01-02 (×2): 1 [IU] via SUBCUTANEOUS
  Administered 2013-01-02: 2 [IU] via SUBCUTANEOUS
  Administered 2013-01-03 (×6): 1 [IU] via SUBCUTANEOUS
  Administered 2013-01-04: 2 [IU] via SUBCUTANEOUS
  Administered 2013-01-04 (×2): 1 [IU] via SUBCUTANEOUS
  Administered 2013-01-05 (×2): 2 [IU] via SUBCUTANEOUS
  Administered 2013-01-05: 1 [IU] via SUBCUTANEOUS
  Administered 2013-01-05 (×3): 2 [IU] via SUBCUTANEOUS
  Administered 2013-01-06 (×3): 1 [IU] via SUBCUTANEOUS
  Administered 2013-01-06: 2 [IU] via SUBCUTANEOUS
  Administered 2013-01-06: 1 [IU] via SUBCUTANEOUS
  Administered 2013-01-07 (×2): 2 [IU] via SUBCUTANEOUS

## 2013-01-01 MED ORDER — FAT EMULSION 20 % IV EMUL
250.0000 mL | INTRAVENOUS | Status: AC
  Administered 2013-01-01: 250 mL via INTRAVENOUS
  Filled 2013-01-01: qty 250

## 2013-01-01 MED ORDER — ONDANSETRON 8 MG/NS 50 ML IVPB
8.0000 mg | Freq: Three times a day (TID) | INTRAVENOUS | Status: DC | PRN
  Administered 2013-01-11: 8 mg via INTRAVENOUS
  Filled 2013-01-01 (×2): qty 8

## 2013-01-01 MED ORDER — SODIUM CHLORIDE 0.9 % IV BOLUS (SEPSIS)
1000.0000 mL | Freq: Once | INTRAVENOUS | Status: AC
  Administered 2013-01-01: 1000 mL via INTRAVENOUS

## 2013-01-01 MED ORDER — ONDANSETRON HCL 4 MG/2ML IJ SOLN: 8.0000 mg | Freq: Three times a day (TID) | INTRAMUSCULAR | Status: DC | PRN

## 2013-01-01 MED ORDER — FENTANYL CITRATE 0.05 MG/ML IJ SOLN
12.5000 ug | INTRAMUSCULAR | Status: DC | PRN
  Administered 2013-01-01 – 2013-01-02 (×2): 12.5 ug via INTRAVENOUS
  Filled 2013-01-01 (×2): qty 2

## 2013-01-01 MED ORDER — IOHEXOL 300 MG/ML  SOLN
25.0000 mL | INTRAMUSCULAR | Status: AC
  Administered 2013-01-01 (×2): 25 mL via ORAL

## 2013-01-01 MED ORDER — TRACE MINERALS CR-CU-F-FE-I-MN-MO-SE-ZN IV SOLN
INTRAVENOUS | Status: AC
  Administered 2013-01-01: 18:00:00 via INTRAVENOUS
  Filled 2013-01-01: qty 1000

## 2013-01-01 MED ORDER — SODIUM CHLORIDE 0.9 % IV SOLN
1.0000 g | Freq: Once | INTRAVENOUS | Status: AC
  Administered 2013-01-02: 1 g via INTRAVENOUS
  Filled 2013-01-01: qty 10

## 2013-01-01 MED ORDER — POTASSIUM CHLORIDE IN NACL 20-0.9 MEQ/L-% IV SOLN
INTRAVENOUS | Status: DC
  Filled 2013-01-01: qty 1000

## 2013-01-01 MED ORDER — SODIUM CHLORIDE 0.9 % IJ SOLN
10.0000 mL | INTRAMUSCULAR | Status: DC | PRN
  Administered 2013-01-01 – 2013-01-08 (×6): 10 mL

## 2013-01-01 MED ORDER — SODIUM CHLORIDE 0.9 % IV BOLUS (SEPSIS): 1000.0000 mL | INTRAVENOUS | Status: DC | PRN

## 2013-01-01 MED ORDER — SODIUM CHLORIDE 0.9 % IV SOLN
INTRAVENOUS | Status: DC
  Administered 2013-01-01: 1000 mL via INTRAVENOUS
  Administered 2013-01-01 – 2013-01-03 (×4): via INTRAVENOUS
  Administered 2013-01-04: 1 mL via INTRAVENOUS

## 2013-01-01 MED ORDER — POTASSIUM CHLORIDE 10 MEQ/50ML IV SOLN
10.0000 meq | INTRAVENOUS | Status: AC
  Administered 2013-01-01 – 2013-01-02 (×6): 10 meq via INTRAVENOUS
  Filled 2013-01-01: qty 300

## 2013-01-01 MED ORDER — SODIUM CHLORIDE 0.9 % IJ SOLN
10.0000 mL | Freq: Two times a day (BID) | INTRAMUSCULAR | Status: DC
  Administered 2013-01-01 – 2013-01-04 (×5): 10 mL
  Administered 2013-01-04: 20 mL
  Administered 2013-01-05 – 2013-01-07 (×4): 10 mL
  Administered 2013-01-08: 30 mL
  Administered 2013-01-09 (×2): 10 mL

## 2013-01-01 MED ORDER — SODIUM CHLORIDE 0.9 % IV BOLUS (SEPSIS)
1000.0000 mL | INTRAVENOUS | Status: AC | PRN
  Administered 2013-01-01 (×6): 1000 mL via INTRAVENOUS

## 2013-01-01 MED ORDER — MEROPENEM 1 G IV SOLR
1.0000 g | Freq: Two times a day (BID) | INTRAVENOUS | Status: DC
  Administered 2013-01-02 – 2013-01-08 (×15): 1 g via INTRAVENOUS
  Filled 2013-01-01 (×17): qty 1

## 2013-01-01 NOTE — Progress Notes (Signed)
Patient ID: Renee Nicholson, female   DOB: Aug 11, 1966, 47 y.o.   MRN: [id number]    Subjective: Called to see the patient due to hypotension and tachycardia.  Patient denies any chest pain, SOB, or increase in abdominal pain.  She does complain of dizziness since she was given phenergan this morning.  She still has nausea.  Objective: Vital signs in last 24 hours: Temp:  [98 F (36.7 C)-98.9 F (37.2 C)] 98.3 F (36.8 C) (04/09 1138) Pulse Rate:  [81-143] 127 (04/09 1205) 120-140s Resp:  [18-24] 24 (04/09 1205) BP: (67-152)/(45-91) 81/53 mmHg (04/09 1205) SpO2:  [98 %-100 %] 99 % (04/09 1205) Weight:  [79 lb (35.834 kg)] 79 lb (35.834 kg) (04/08 1644) Last BM Date: 12/31/12  Intake/Output from previous day: 04/08 0701 - 04/09 0700 In: 1500 [I.V.:1500] Out: 10 [Stool:10] Intake/Output this shift: Total I/O In: 515 [I.V.:515] Out: 0   PE: Abd: soft, minimally tender, minimal distention, no peritoneal signs. Heart: tachycardic, sounds NSR Lungs: CTAB General: NAD, but is very cachetic and chronically ill appearing  Lab Results:   Recent Labs  12/31/12 1225  WBC 12.7*  HGB 13.2  HCT 36.9  PLT 261   BMET  Recent Labs  12/31/12 1225  NA 134*  K 3.5  CL 93*  CO2 27  GLUCOSE 114*  BUN 15  CREATININE 0.58  CALCIUM 9.6   PT/INR No results found for this basename: LABPROT, INR,  in the last 72 hours CMP     Component Value Date/Time   NA 134* 12/31/2012 1225   NA 139 12/12/2012 1037   K 3.5 12/31/2012 1225   K 3.8 12/12/2012 1037   CL 93* 12/31/2012 1225   CL 103 12/12/2012 1037   CO2 27 12/31/2012 1225   CO2 29 12/12/2012 1037   GLUCOSE 114* 12/31/2012 1225   GLUCOSE 96 12/12/2012 1037   BUN 15 12/31/2012 1225   BUN 12.8 12/12/2012 1037   CREATININE 0.58 12/31/2012 1225   CREATININE 0.8 12/12/2012 1037   CREATININE 0.70 07/27/2011 1306   CALCIUM 9.6 12/31/2012 1225   CALCIUM 9.3 12/12/2012 1037   PROT 7.5 12/31/2012 1225   PROT 6.7 12/12/2012 1037   ALBUMIN 4.1 12/31/2012 1225    ALBUMIN 3.5 12/12/2012 1037   AST 19 12/31/2012 1225   AST 20 12/12/2012 1037   ALT 18 12/31/2012 1225   ALT 19 12/12/2012 1037   ALKPHOS 91 12/31/2012 1225   ALKPHOS 86 12/12/2012 1037   BILITOT 1.0 12/31/2012 1225   BILITOT 0.89 12/12/2012 1037   GFRNONAA >90 12/31/2012 1225   GFRAA >90 12/31/2012 1225   Lipase     Component Value Date/Time   LIPASE 48 12/31/2012 1225       Studies/Results: Dg Abd Acute W/chest  12/31/2012  *RADIOLOGY REPORT*  Clinical Data: Abdominal pain.  Nausea and vomiting.  Prior history of rectal cancer with creation of a J pouch and ileoanal anastomosis.  ACUTE ABDOMEN SERIES (ABDOMEN 2 VIEW & CHEST 1 VIEW)  Comparison: Acute abdomen series 12/24/2012.  Two-view abdomen x- rays 12/25/2012, 11/16/2012.  Findings: Progressive distention of multiple small bowel loops since the examination 6 days ago, and there are several more dilated small bowel loops currently.  Gas is present within the rectum.  No evidence of free intraperitoneal air on the erect image.  Air-fluid levels throughout the dilated small bowel.  Air- fluid level within the stomach.  Phleboliths in the pelvis.  No visible opaque urinary tract calculi.  Regional  skeleton unremarkable.  Cardiomediastinal silhouette unremarkable, unchanged.  Lungs clear. Bronchovascular markings normal.  Pulmonary vascularity normal.  No pneumothorax.  No pleural effusions.  Apparent nodular opacity projected over the left lung base is felt to represent the left nipple shadow.  Left subclavian Port-A-Cath tip projects over the upper SVC.  IMPRESSION:  1.  Worsening partial small bowel obstruction.  No free intraperitoneal air. 2.  No acute cardiopulmonary disease.   Original Report Authenticated By: Hulan Saas, M.D.     Anti-infectives: Anti-infectives   None       Assessment/Plan  1. PSBO, s/p  Total abdominal colectomy with J pouch. 2. Hypovolemia 3. Hypotension, tachycardia, likely secondary to #1. 4. Severe  PCM  Plan: 1. Transfer to SDU for closer monitoring.  EKG just shows sinus tach.  Patient has responded some to fluid.  Will continue to hydrate and watch closely. 2. To rule out an abdominal source, we will obtain a CT scan since she just had x-rays yesterday. 3. The patient is agreeable to an NGT now, so this will be placed also. 4. Will check some labs as well since she has not had today.  LOS: 1 day    Jasiel Apachito E 01/01/2013, 12:24 PM Pager: [phone]

## 2013-01-01 NOTE — Progress Notes (Signed)
[facility] Physician-Brief Progress Note Patient Name: Renee Nicholson DOB: 1965-09-30 MRN: [id number]  Date of Service  01/01/2013   HPI/Events of Note  Hypocalcemia corrected for low albumin is 7.8   eICU Interventions  Plan: 1 gm  of calcium gluconate IV   Intervention Category Intermediate Interventions: Electrolyte abnormality - evaluation and management  DETERDING,ELIZABETH 01/01/2013, 11:30 PM

## 2013-01-01 NOTE — Progress Notes (Signed)
CRITICAL VALUE ALERT  Critical value received:  Calcium 6.1  Date of notification:  01/01/2013  Time of notification:  2130  Critical value read back:yes  Nurse who received alert:  Lamount Cranker  MD notified (1st page):  Dr. Darrick Penna  Time of first page:  2130  MD notified (2nd page):  Time of second page:  Responding MD:  Dr. Darrick Penna  Time MD responded:  2130

## 2013-01-01 NOTE — Progress Notes (Signed)
eLink Physician-Brief Progress Note Patient Name: Renee Nicholson DOB: 1966-04-04 MRN: [id number]  Date of Service  01/01/2013   HPI/Events of Note  Hypokalemia   eICU Interventions  Potassium replaced   Intervention Category Intermediate Interventions: Electrolyte abnormality - evaluation and management  DETERDING,ELIZABETH 01/01/2013, 11:32 PM

## 2013-01-01 NOTE — Significant Event (Signed)
Rapid Response Event Note  Overview: Time Called: 1155 Arrival Time: 1200 Event Type: Hypotension  Initial Focused Assessment: BP  68/49 - 81/53  HR 140s  RR 32  O2 sat 99% on 2L/Renee Nicholson Patient alert and oriented, communicating well. Patient with chills/tremors Patient vomiting during NG placement, 250cc emesis, 600cc NG output, Thick bilious output  Interventions: 1L NS bolus,  BP 88/50,  15 min post bolus complete BP 64/40 2nd L NS bolus infusing Foley placed, clear yellow urine Patient transported to 2105 via bed with O2 and Heart monitor.  RN at bedside updated on patient status. Husband at bedside through out event.  Event Summary: Name of Physician Notified: Barnetta Chapel PA at bedside, Dr Dwain Sarna notified at 1215  Name of Consulting Physician Notified: CCM/Pete Babcock PA at 1230  Outcome: Transferred (Comment) (2105)  Event End Time: 1415  Renee Nicholson

## 2013-01-01 NOTE — Progress Notes (Signed)
Call placed to Dr. Arita Miss office for B/P of 67/45 and pulse of 140.  Color gray.  Pt. Complaining of dizziness.

## 2013-01-01 NOTE — Progress Notes (Signed)
PARENTERAL NUTRITION CONSULT NOTE - INITIAL  Pharmacy Consult for TPN Indication: SBO  No Known Allergies  Patient Measurements: Height: 4\' 5"  (134.6 cm) Weight: 79 lb (35.834 kg) IBW/kg (Calculated) : 29.4 Usual Weight: 95 lb  Vital Signs: Temp: 98.1 F (36.7 C) (04/09 0533) Temp src: Oral (04/09 0533) BP: 110/82 mmHg (04/09 0533) Pulse Rate: 120 (04/09 0533) Intake/Output from previous day: 04/08 0701 - 04/09 0700 In: 1500 [I.V.:1500] Out: 10 [Stool:10] Intake/Output from this shift:    Labs:  Recent Labs  12/31/12 1225  WBC 12.7*  HGB 13.2  HCT 36.9  PLT 261     Recent Labs  12/31/12 1225  NA 134*  K 3.5  CL 93*  CO2 27  GLUCOSE 114*  BUN 15  CREATININE 0.58  CALCIUM 9.6  PROT 7.5  ALBUMIN 4.1  AST 19  ALT 18  ALKPHOS 91  BILITOT 1.0   Estimated Creatinine Clearance: 43.9 ml/min (by C-G formula based on Cr of 0.58).   No results found for this basename: GLUCAP,  in the last 72 hours  Medical History: Past Medical History  Diagnosis Date  . Anemia   . Diarrhea   . Anxiety   . Bright red rectal bleeding 09/13/2011  . History of chemotherapy     completed 09/2011   . Hx of radiation therapy 08/29/11 to 10/09/11    rectum  . GERD (gastroesophageal reflux disease)   . Ileostomy in place   . Cancer   . Rectal cancer     Medications:  Prescriptions prior to admission  Medication Sig Dispense Refill  . B Complex-C (B-COMPLEX WITH VITAMIN C) tablet Take 1 tablet by mouth daily.      . ferrous sulfate 325 (65 FE) MG tablet Take 325 mg by mouth daily with breakfast.      . lactose free nutrition (BOOST) LIQD Take 237 mLs by mouth 2 (two) times daily.      . Multiple Vitamin (MULTIVITAMIN) tablet Take 1 tablet by mouth every morning.       . pantoprazole (PROTONIX) 40 MG tablet Take 40 mg by mouth every morning.       . potassium chloride SA (K-DUR,KLOR-CON) 20 MEQ tablet Take 20 mEq by mouth every morning.      . Simethicone 125 MG CAPS Take  1 capsule by mouth 4 (four) times daily as needed (for gas or upset stomach).         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 Complicated by stricture requiring dilatation. This is now the 4th episode of small bowel obstruction requiring admission(she was discharged on 4/4). Patient presents with 24 hr of minimal ostomy output, abdominal and back pain, nausea and emesis. Xray in ER shows findings consistent with SBO.  Lytes:  K 3.5, Na 134 GI:  LFTs wnl   Having emesis and may need placement NG tube.  Plan OR ileostomy takedown and LOA.   Nutritional Goals:  1000-1150 kCal, 50-65 grams of protein per day Clinimix E 5/15 at 50 ml/hr will provide 60 gm protein daily and aver 1058 Kcal ( with lipids MWF)  Plan:  Start Clinimix E 5/15 at 30 ml/hr. Decrease IV rate as TPN advanced. Add sens ssi.  May dc if tolerates TPN Lipids, MVI and trace elements on MWF only due to national backorder.  F/u am labs   Woodfin Ganja 01/01/2013,8:21 AM

## 2013-01-01 NOTE — Consult Note (Signed)
PULMONARY  / CRITICAL CARE MEDICINE  Name: Renee Nicholson MRN: [id number] DOB: 02-Aug-1966    ADMISSION DATE:  12/31/2012 CONSULTATION DATE:  01/01/2013  REFERRING MD :  Donell Beers PRIMARY SERVICE: Surgery  CHIEF COMPLAINT:  Shock  BRIEF PATIENT DESCRIPTION: 47 year old female with a h/o Rectal CA, GERD, recurrent SBO, total colectomy with ileo-anal anastomosis and diverting loop ileostomy. Presented 4/8 with a 24 hour history of abdomnial pain, N/V, and minimal ostomy output. She was admitted for SBO. 4/9 she became tachycardiac and hypotensive. PCCM has been asked to see.   SIGNIFICANT EVENTS / STUDIES:  4/8 - Abd XRay - Small Bowel Obstruction. No peritoneal air.  4/9 - CT abd/pelvis >>>   LINES / TUBES: CVL 4/9 >>> NGT 4/9 >>>  CULTURES: BC x 2 4/9 >>> Urine 4/9 >>> Sputum 4/9 >>>  ANTIBIOTICS:  Meropenem 4/9 >>>  HISTORY OF PRESENT ILLNESS:  47 year old female with a h/o Rectal CA, GERD, and bowel obstruction. 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 She has had multiple small bowel obstructions requiring admission. Just discharged 4/4 after being treated medically for SBO.  4/7 she began having abdominal pain with markedly decreased ostomy output. Pain radiated to back. Also had nausea and vomiting. Abd XRay was consistent with SBO. She was admitted to inpatient at [facility]. 4/9 became hypotensive and tachycardic. She was transferred to ICU and PCCM has been asked to consult in this setting.    PAST MEDICAL HISTORY :  Past Medical History  Diagnosis Date  . Anemia   . Diarrhea   . Anxiety   . Bright red rectal bleeding 09/13/2011  . History of chemotherapy     completed 09/2011   . Hx of radiation therapy 08/29/11 to 10/09/11    rectum  . GERD (gastroesophageal reflux disease)   . Ileostomy in place   . Cancer   . Rectal cancer    Past Surgical History  Procedure Laterality Date  . Tubal ligation    . Carpal tunnel release     . Colonoscopy  07/26/2011    Procedure: COLONOSCOPY;  Surgeon: Arlyce Harman, MD;  [facility];  Service: Endoscopy;  Laterality: N/A;  10:40  . Esophagogastroduodenoscopy  07/26/2011    Procedure: ESOPHAGOGASTRODUODENOSCOPY (EGD);  Surgeon: Arlyce Harman, MD;  [facility]: [facility];  Service: Endoscopy;  Laterality: N/A;  . Esophagogastroduodenoscopy  12/27/2011    Procedure: ESOPHAGOGASTRODUODENOSCOPY (EGD);  Surgeon: Shirley Friar, MD;  Location: [facility];  Service: Endoscopy;  Laterality: N/A;  . Laparoscopic colon resection  12/14/11    diverting ileostomy  . Portacath placement  02/21/2012    Procedure: INSERTION PORT-A-CATH;  Surgeon: [name], MD;  Location: [facility];  Service: General;  Laterality: N/A;  . Evaluation under anesthesia with anal fissurotomy N/A 11/06/2012    Procedure: Exam Under Anesthesia , Anal Dilation;  Surgeon: [name], MD;  Location: [facility];  Service: General;  Laterality: N/A;  Exam Under Anesthesia , Anal Dilation   Prior to Admission medications   Medication Sig Start Date End Date Taking? Authorizing Provider  B Complex-C (B-COMPLEX WITH VITAMIN C) tablet Take 1 tablet by mouth daily.   Yes Historical Provider, MD  ferrous sulfate 325 (65 FE) MG tablet Take 325 mg by mouth daily with breakfast.   Yes Historical Provider, MD  lactose free nutrition (BOOST) LIQD Take 237 mLs by mouth 2 (two) times daily.  Yes Historical Provider, MD  Multiple Vitamin (MULTIVITAMIN) tablet Take 1 tablet by mouth every morning.    Yes Historical Provider, MD  pantoprazole (PROTONIX) 40 MG tablet Take 40 mg by mouth every morning.    Yes Historical Provider, MD  potassium chloride SA (K-DUR,KLOR-CON) 20 MEQ tablet Take 20 mEq by mouth every morning. 06/26/12 06/26/13 Yes Victorino December, MD  Simethicone 125 MG CAPS Take 1 capsule by mouth 4 (four) times daily as needed (for gas or upset stomach).    Yes Historical Provider, MD   No Known  Allergies  FAMILY HISTORY:  Family History  Problem Relation Age of Onset  . Diabetes Mother   . Colon cancer Neg Hx   . Cancer Brother 35    rectal cancer lives in [place]   SOCIAL HISTORY:  reports that she has never smoked. She has never used smokeless tobacco. She reports that she does not drink alcohol or use illicit drugs.  REVIEW OF SYSTEMS: Unable due to encephalopathy.  SUBJECTIVE:   VITAL SIGNS: Temp:  [98 F (36.7 C)-98.9 F (37.2 C)] 98.3 F (36.8 C) (04/09 1138) Pulse Rate:  [81-143] 140 (04/09 1300) Resp:  [18-32] 32 (04/09 1234) BP: (64-152)/(0-91) 84/0 mmHg (04/09 1305) SpO2:  [98 %-100 %] 99 % (04/09 1234) Weight:  [35.834 kg (79 lb)] 35.834 kg (79 lb) (04/08 1644) HEMODYNAMICS:   VENTILATOR SETTINGS:   INTAKE / OUTPUT: Intake/Output     04/08 0701 - 04/09 0700 04/09 0701 - 04/10 0700   I.V. (mL/kg) 1500 (41.9) 515 (14.4)   NG/GT  325   Total Intake(mL/kg) 1500 (41.9) 840 (23.4)   Emesis/NG output 0 850   Stool 10 0   Total Output 10 850   Net +1490 -10        Urine Occurrence 1 x 1 x   Emesis Occurrence 1 x 3 x     PHYSICAL EXAMINATION: General:  Petite female, acutely ill in appearance. Neuro:  Alert, oriented. Follows commands HEENT:  [place], no JVD Cardiovascular:  RRR no m/r/g Lungs:  Clear bilaterally.  Abdomen:  GI tube draining moderate amount green/brown gastric secretions Musculoskeletal:  Intact Skin:  Intact  LABS:  Recent Labs Lab 12/31/12 1225  HGB 13.2  WBC 12.7*  PLT 261  NA 134*  K 3.5  CL 93*  CO2 27  GLUCOSE 114*  BUN 15  CREATININE 0.58  CALCIUM 9.6  AST 19  ALT 18  ALKPHOS 91  BILITOT 1.0  PROT 7.5  ALBUMIN 4.1   No results found for this basename: GLUCAP,  in the last 168 hours  CXR: 4/9: No acute cardiopulmonary process.    ASSESSMENT / PLAN:  PULMONARY A: Tachypnea - in setting of SIRS, CXR was clear.  P:   ABG Supplemental O2 via nasal cannula Monitor  CARDIOVASCULAR A: Shock - Question as  to hypovolemic or septic in origin (see ID section) P:  Fluid Resuscitation. Target MAP: > 65 mm/Hg Insert CVL and Monitor CVP: target > 8 mm/Hg--->will start EGDT protocol  RENAL A:  No Acute Issue Metabolic Acidosis - May be a Gap component, but unclear at this time.  P:   Monitor renal function/lytes closely Will replace as needed   GASTROINTESTINAL A:  Small Bowel Obstrucition w/ complicated surgical bowel history  P:   NGT to intermittent suction CT abd/pelvis per surgery Surgery following -->possible exploration on 4/10  HEMATOLOGIC A:  Anemia - Baseline hgb 12.1. Now 10.8. Suspect this reflects dilution.  She has received 3L NS within the last couple hours.  P:  Monitor/Supportive Care F/u CBC tonight If further decrease will hold lovenox.   INFECTIOUS A:  SIRS, suspect abd sepsis   P:   Micro and abx as above Check PCT, Lactate Trend WBC and fever curve.  Follow Cultures  ENDOCRINE A:  No Acute Issue   P:   Hyperglycemia Protocol  NEUROLOGIC A:  Acute Encephalopathy - in setting of shock P:   Supportive Care  TODAY'S SUMMARY: She will be started on antibiotics, and fluid resuscitated in the ICU.   I have personally obtained a history, examined the patient, evaluated laboratory and imaging results, formulated the assessment and plan and placed orders. CRITICAL CARE: The patient is critically ill with multiple organ systems failure and requires high complexity decision making for assessment and support, frequent evaluation and titration of therapies, application of advanced monitoring technologies and extensive interpretation of multiple databases. Critical Care Time devoted to patient care services described in this note is 35 minutes.     Billy Fischer, MD ; [facility] [phone].  After 5:30 PM or weekends, call [phone]

## 2013-01-01 NOTE — Progress Notes (Signed)
  Subjective: Emesis today.    Objective: Vital signs in last 24 hours: Temp:  [98.2 F (36.8 C)-98.9 F (37.2 C)] 98.9 F (37.2 C) (04/09 0123) Pulse Rate:  [81-116] 116 (04/09 0123) Resp:  [16-20] 18 (04/09 0123) BP: (135-163)/(75-97) 152/91 mmHg (04/09 0123) SpO2:  [99 %-100 %] 99 % (04/09 0123) Weight:  [79 lb (35.834 kg)] 79 lb (35.834 kg) (04/08 1644) Last BM Date: 12/31/12  Intake/Output from previous day: 04/08 0701 - 04/09 0700 In: 630 [I.V.:630] Out: 0  Intake/Output this shift: Total I/O In: 425 [I.V.:425] Out: 0   General appearance: alert, cooperative and no distress Cardio: regular rate and rhythm GI: soft, non distended, non tender Extremities: extremities normal, atraumatic, no cyanosis or edema  Lab Results:   Recent Labs  12/31/12 1225  WBC 12.7*  HGB 13.2  HCT 36.9  PLT 261   BMET  Recent Labs  12/31/12 1225  NA 134*  K 3.5  CL 93*  CO2 27  GLUCOSE 114*  BUN 15  CREATININE 0.58  CALCIUM 9.6   PT/INR No results found for this basename: LABPROT, INR,  in the last 72 hours ABG No results found for this basename: PHART, PCO2, PO2, HCO3,  in the last 72 hours  Studies/Results: Dg Abd Acute W/chest  12/31/2012  *RADIOLOGY REPORT*  Clinical Data: Abdominal pain.  Nausea and vomiting.  Prior history of rectal cancer with creation of a J pouch and ileoanal anastomosis.  ACUTE ABDOMEN SERIES (ABDOMEN 2 VIEW & CHEST 1 VIEW)  Comparison: Acute abdomen series 12/24/2012.  Two-view abdomen x- rays 12/25/2012, 11/16/2012.  Findings: Progressive distention of multiple small bowel loops since the examination 6 days ago, and there are several more dilated small bowel loops currently.  Gas is present within the rectum.  No evidence of free intraperitoneal air on the erect image.  Air-fluid levels throughout the dilated small bowel.  Air- fluid level within the stomach.  Phleboliths in the pelvis.  No visible opaque urinary tract calculi.  Regional  skeleton unremarkable.  Cardiomediastinal silhouette unremarkable, unchanged.  Lungs clear. Bronchovascular markings normal.  Pulmonary vascularity normal.  No pneumothorax.  No pleural effusions.  Apparent nodular opacity projected over the left lung base is felt to represent the left nipple shadow.  Left subclavian Port-A-Cath tip projects over the upper SVC.  IMPRESSION:  1.  Worsening partial small bowel obstruction.  No free intraperitoneal air. 2.  No acute cardiopulmonary disease.   Original Report Authenticated By: Hulan Saas, M.D.     Anti-infectives: Anti-infectives   None      Assessment/Plan: s/p * No surgery found * TNA NPO Will move up surgery, ileostomy takedown and lap LOA. May need NGT if vomits again.     LOS: 1 day    [facility] 01/01/2013

## 2013-01-01 NOTE — Progress Notes (Addendum)
INITIAL NUTRITION ASSESSMENT  DOCUMENTATION CODES Per approved criteria  -Severe malnutrition in the context of chronic illness   INTERVENTION: 1.  Parenteral nutrition; per PharmD management.  Pt is at high risk for refeeding syndrome.  Discussed with unit PharmD.   NUTRITION DIAGNOSIS: Inadequate oral intake related to altered GI function as evidenced by NPO, new TPN.   Monitor:  1.  Food/Beverage; resume of PO diet once appropriate.   Reason for Assessment: consult; new TPN  47 y.o. female  Admitting Dx: PSBO  ASSESSMENT: Pt admitted with abdominal pain, nausea, and vomiting.  RD attempted to meet with pt on med/surg as well as ICU units, however pt in assessments r/t to transfer for low blood pressure and tachycardia.  Per chart review, pt has lost 4 lbs over the past 8 months, however is cachetic in appearance with BMI of 19.4- based on document ht of 4'5". *Note pt's ht has been documented as 4'5" and 4'9", would benefit from objective measurement.  MD has dx pt with severe protein-calorie malnutrition.  This RD agrees based on recent assessments of intake and body habitus performed by clinical nutrition staff.  Previously reported being vegetarian and has consumed 2-4 Ensures/day per chart review.  RD to follow for ongoing assessment and appropriate interventions.  NGT placed.  Patient is to initiate TPN with Clinimix E 5/15 @ 30 ml/hr.  Lipids (20% IVFE @ 10 ml/hr), multivitamins, and trace elements are provided 3 times weekly (MWF) due to national backorder.    Additional IVF with NS @ 150 ml/hr.    Height: Ht Readings from Last 1 Encounters:  12/31/12 4\' 5"  (1.346 m)    Weight: Wt Readings from Last 1 Encounters:  12/31/12 79 lb (35.834 kg)    Ideal Body Weight: 43.1 kg  % Ideal Body Weight: 82%  Wt Readings from Last 10 Encounters:  12/31/12 79 lb (35.834 kg)  12/25/12 72 lb 3.2 oz (32.75 kg)  12/16/12 77 lb (34.927 kg)  12/12/12 76 lb 4.8 oz (34.609  kg)  11/22/12 76 lb 6 oz (34.643 kg)  11/16/12 76 lb 0.9 oz (34.5 kg)  11/14/12 78 lb 3.2 oz (35.471 kg)  10/30/12 79 lb (35.834 kg)  10/18/12 77 lb 12.8 oz (35.29 kg)  10/17/12 78 lb 8 oz (35.607 kg)    Usual Body Weight: 95 lbs per previous reports  % Usual Body Weight: 83%  BMI:  Body mass index is 19.78 kg/(m^2).  Estimated Nutritional Needs: Kcal: 1220-1380 Protein: 55-65 Fluid: >1.5 L/day  Skin: intact  Diet Order: NPO  EDUCATION NEEDS: -Education not appropriate at this time   Intake/Output Summary (Last 24 hours) at 01/01/13 1142 Last data filed at 01/01/13 0642  Gross per 24 hour  Intake   1500 ml  Output     10 ml  Net   1490 ml    Last BM: 4/8  Labs:   Recent Labs Lab 12/31/12 1225  NA 134*  K 3.5  CL 93*  CO2 27  BUN 15  CREATININE 0.58  CALCIUM 9.6  GLUCOSE 114*    CBG (last 3)  No results found for this basename: GLUCAP,  in the last 72 hours  Scheduled Meds: . enoxaparin (LOVENOX) injection  30 mg Subcutaneous Q24H  . insulin aspart  0-9 Units Subcutaneous Q4H  . pantoprazole (PROTONIX) IV  40 mg Intravenous Q12H    Continuous Infusions: . sodium chloride 125 mL/hr at 12/31/12 1423  . 0.9 % NaCl with KCl 20  mEq / L 100 mL/hr at 01/01/13 0200  . 0.9 % NaCl with KCl 20 mEq / L    . Marland KitchenTPN (CLINIMIX-E) Adult     And  . fat emulsion      Past Medical History  Diagnosis Date  . Anemia   . Diarrhea   . Anxiety   . Bright red rectal bleeding 09/13/2011  . History of chemotherapy     completed 09/2011   . Hx of radiation therapy 08/29/11 to 10/09/11    rectum  . GERD (gastroesophageal reflux disease)   . Ileostomy in place   . Cancer   . Rectal cancer     Past Surgical History  Procedure Laterality Date  . Tubal ligation    . Carpal tunnel release    . Colonoscopy  07/26/2011    Procedure: COLONOSCOPY;  Surgeon: Arlyce Harman, MD;  [facility];  Service: Endoscopy;  Laterality: N/A;  10:40  .  Esophagogastroduodenoscopy  07/26/2011    Procedure: ESOPHAGOGASTRODUODENOSCOPY (EGD);  Surgeon: Arlyce Harman, MD;  [facility];  Service: Endoscopy;  Laterality: N/A;  . Esophagogastroduodenoscopy  12/27/2011    Procedure: ESOPHAGOGASTRODUODENOSCOPY (EGD);  Surgeon: Shirley Friar, MD;  Location: [facility];  Service: Endoscopy;  Laterality: N/A;  . Laparoscopic colon resection  12/14/11    diverting ileostomy  . Portacath placement  02/21/2012    Procedure: INSERTION PORT-A-CATH;  Surgeon: [name], MD;  Location: [facility];  Service: General;  Laterality: N/A;  . Evaluation under anesthesia with anal fissurotomy N/A 11/06/2012    Procedure: Exam Under Anesthesia , Anal Dilation;  Surgeon: [name], MD;  Location: [facility];  Service: General;  Laterality: N/A;  Exam Under Anesthesia , Anal Dilation    Loyce Dys, MS RD LDN Clinical Inpatient Dietitian Pager: [phone] Weekend/After hours pager: [phone]

## 2013-01-01 NOTE — Procedures (Signed)
Central Venous Catheter Insertion Procedure Note Renee Nicholson [id number] 08/31/1966  Procedure: Insertion of Central Venous Catheter Indications: Assessment of intravascular volume, Drug and/or fluid administration and Frequent blood sampling  Procedure Details Consent: Risks of procedure as well as the alternatives and risks of each were explained to the (patient/caregiver).  Consent for procedure obtained. Time Out: Verified patient identification, verified procedure, site/side was marked, verified correct patient position, special equipment/implants available, medications/allergies/relevent history reviewed, required imaging and test results available.  Performed  Maximum sterile technique was used including antiseptics, cap, gloves, gown, hand hygiene, mask and sheet. Skin prep: Chlorhexidine; local anesthetic administered A antimicrobial bonded/coated triple lumen catheter was placed in the right internal jugular vein using the Seldinger technique.  Evaluation Blood flow good Complications: No apparent complications Patient did tolerate procedure well. Chest X-ray ordered to verify placement.  CXR: pending.  Duayne Cal 01/01/2013, 3:46 PM    I was present for and supervised the entire procedure  Billy Fischer, MD ; [facility] service [place] [phone].  After 5:30 PM or weekends, call [phone]

## 2013-01-01 NOTE — Progress Notes (Signed)
#  16 Fr. NG tube inserted in right nare and connected to low int. Suction.  250 ml. Brown/green fecal smelling material obtained immediately.  Pt. Tol. fair with several episodes of emesis.

## 2013-01-01 NOTE — Progress Notes (Signed)
I agree with above, will place in icu due to hypotension which is likely from volume.

## 2013-01-01 NOTE — Progress Notes (Signed)
ANTIBIOTIC CONSULT NOTE - INITIAL  Pharmacy Consult for Meropenem Indication: r/o sepsis (abd source)  No Known Allergies  Patient Measurements: Height: 4\' 5"  (134.6 cm) Weight: 79 lb (35.834 kg) IBW/kg (Calculated) : 29.4  Vital Signs: Temp: 98.3 F (36.8 C) (04/09 1138) Temp src: Oral (04/09 1138) BP: 99/59 mmHg (04/09 1415) Pulse Rate: 142 (04/09 1415) Intake/Output from previous day: 04/08 0701 - 04/09 0700 In: 1500 [I.V.:1500] Out: 10 [Stool:10] Intake/Output from this shift: Total I/O In: 2839 [I.V.:2514; NG/GT:325] Out: 1650 [Emesis/NG output:1650]  Labs:  Recent Labs  12/31/12 1225  WBC 12.7*  HGB 13.2  PLT 261  CREATININE 0.58   Estimated Creatinine Clearance: 43.9 ml/min (by C-G formula based on Cr of 0.58). No results found for this basename: VANCOTROUGH, VANCOPEAK, VANCORANDOM, GENTTROUGH, GENTPEAK, GENTRANDOM, TOBRATROUGH, TOBRAPEAK, TOBRARND, AMIKACINPEAK, AMIKACINTROU, AMIKACIN,  in the last 72 hours   Microbiology: No results found for this or any previous visit (from the past 720 hour(s)).  Medical History: Past Medical History  Diagnosis Date  . Anemia   . Diarrhea   . Anxiety   . Bright red rectal bleeding 09/13/2011  . History of chemotherapy     completed 09/2011   . Hx of radiation therapy 08/29/11 to 10/09/11    rectum  . GERD (gastroesophageal reflux disease)   . Ileostomy in place   . Cancer   . Rectal cancer     Assessment: 47 y.o. F with PMH with history significant for total colectomy with ileo-anal anastomosis and diverting loop ileostomy in March '13 admitted on 12/31/12 with N/V, abdominal pain, and increased ostomy output and was found to have a recurrent PSBO. Today, the patient became hypotensive and tachycardic and pharmacy was consulted to start Meropenem for empiric sepsis coverage. Wt: 35.8 kg, SCr 0.58, CrCl~40-50 ml/min.  Goal of Therapy:  Proper antibiotics for infection/cultures adjusted for renal/hepatic function    Plan:  1. Meropenem 1g IV x 1 now followed by 1g IV every 12 hours 2. Will continue to follow renal function, culture results, LOT, and antibiotic de-escalation plans   Georgina Pillion, PharmD, BCPS Clinical Pharmacist Pager: [phone] 01/01/2013 3:44 PM

## 2013-01-02 ENCOUNTER — Ambulatory Visit: Payer: BC Managed Care – PPO | Admitting: Physical Therapy

## 2013-01-02 ENCOUNTER — Inpatient Hospital Stay (HOSPITAL_COMMUNITY): Payer: BC Managed Care – PPO

## 2013-01-02 DIAGNOSIS — J9600 Acute respiratory failure, unspecified whether with hypoxia or hypercapnia: Secondary | ICD-10-CM

## 2013-01-02 DIAGNOSIS — I959 Hypotension, unspecified: Secondary | ICD-10-CM

## 2013-01-02 DIAGNOSIS — E43 Unspecified severe protein-calorie malnutrition: Secondary | ICD-10-CM

## 2013-01-02 DIAGNOSIS — R000 Tachycardia, unspecified: Secondary | ICD-10-CM

## 2013-01-02 LAB — COMPREHENSIVE METABOLIC PANEL
ALT: 34 U/L (ref 0–35)
AST: 68 U/L — ABNORMAL HIGH (ref 0–37)
AST: 88 U/L — ABNORMAL HIGH (ref 0–37)
Albumin: 2.8 g/dL — ABNORMAL LOW (ref 3.5–5.2)
Alkaline Phosphatase: 33 U/L — ABNORMAL LOW (ref 39–117)
Alkaline Phosphatase: 47 U/L (ref 39–117)
CO2: 11 mEq/L — ABNORMAL LOW (ref 19–32)
Calcium: 6.4 mg/dL — CL (ref 8.4–10.5)
Chloride: 115 mEq/L — ABNORMAL HIGH (ref 96–112)
GFR calc Af Amer: >90 mL/min (ref >90)
GFR calc non Af Amer: >90 mL/min (ref >90)
Glucose, Bld: 166 mg/dL — ABNORMAL HIGH (ref 70–99)
Potassium: 4.4 mEq/L (ref 3.5–5.1)
Potassium: 3.4 mEq/L — ABNORMAL LOW (ref 3.5–5.1)
Sodium: 142 mEq/L (ref 135–145)
Total Bilirubin: 0.5 mg/dL (ref 0.3–1.2)

## 2013-01-02 LAB — CHOLESTEROL, TOTAL: Cholesterol: 66 mg/dL (ref 0–200)

## 2013-01-02 LAB — POCT I-STAT 3, ART BLOOD GAS (G3+)
Bicarbonate: 13 mEq/L — ABNORMAL LOW (ref 20.0–24.0)
O2 Saturation: 88 %
Patient temperature: 98.6
TCO2: 14 mmol/L (ref 0–100)
pCO2 arterial: 35.5 mmHg (ref 35.0–45.0)

## 2013-01-02 LAB — CBC
HCT: 23.1 % — ABNORMAL LOW (ref 36.0–46.0)
Hemoglobin: 11.2 g/dL — ABNORMAL LOW (ref 12.0–15.0)
Hemoglobin: 8.3 g/dL — ABNORMAL LOW (ref 12.0–15.0)
MCH: 29.9 pg (ref 26.0–34.0)
MCV: 82.4 fL (ref 78.0–100.0)
MCV: 82.8 fL (ref 78.0–100.0)
Platelets: 163 10*3/uL (ref 150–400)
RBC: 3.75 MIL/uL — ABNORMAL LOW (ref 3.87–5.11)
RDW: 14.3 % (ref 11.5–15.5)
WBC: 3.3 10*3/uL — ABNORMAL LOW (ref 4.0–10.5)
WBC: 1.2 10*3/uL — CL (ref 4.0–10.5)

## 2013-01-02 LAB — DIFFERENTIAL
Basophils Relative: 0 % (ref 0–1)
Eosinophils Relative: 1 % (ref 0–5)
Monocytes Relative: 2 % — ABNORMAL LOW (ref 3–12)
Neutrophils Relative %: 84 % — ABNORMAL HIGH (ref 43–77)

## 2013-01-02 LAB — PROCALCITONIN: Procalcitonin: 76.89 ng/mL

## 2013-01-02 LAB — PHOSPHORUS: Phosphorus: 2.2 mg/dL — ABNORMAL LOW (ref 2.3–4.6)

## 2013-01-02 LAB — LACTIC ACID, PLASMA: Lactic Acid, Venous: 5.4 mmol/L — ABNORMAL HIGH (ref 0.5–2.2)

## 2013-01-02 LAB — GLUCOSE, CAPILLARY: Glucose-Capillary: 93 mg/dL (ref 70–99)

## 2013-01-02 LAB — CORTISOL: Cortisol, Plasma: 88.7 ug/dL

## 2013-01-02 MED ORDER — SODIUM GLYCEROPHOSPHATE 1 MMOLE/ML IV SOLN
10.0000 mmol | Freq: Once | INTRAVENOUS | Status: DC
  Filled 2013-01-02: qty 10

## 2013-01-02 MED ORDER — MIDAZOLAM HCL 2 MG/2ML IJ SOLN
INTRAMUSCULAR | Status: AC
  Filled 2013-01-02: qty 4

## 2013-01-02 MED ORDER — MAGNESIUM SULFATE 40 MG/ML IJ SOLN
4.0000 g | Freq: Once | INTRAMUSCULAR | Status: DC
  Filled 2013-01-02: qty 100

## 2013-01-02 MED ORDER — FENTANYL CITRATE 0.05 MG/ML IJ SOLN
50.0000 ug | INTRAMUSCULAR | Status: DC | PRN
  Administered 2013-01-02: 100 ug via INTRAVENOUS
  Filled 2013-01-02 (×2): qty 2

## 2013-01-02 MED ORDER — SODIUM PHOSPHATE 3 MMOLE/ML IV SOLN: 30.0000 mmol | Freq: Once | INTRAVENOUS | Status: DC

## 2013-01-02 MED ORDER — MIDAZOLAM HCL 2 MG/2ML IJ SOLN
2.0000 mg | Freq: Once | INTRAMUSCULAR | Status: AC
  Administered 2013-01-02: 2 mg via INTRAVENOUS

## 2013-01-02 MED ORDER — ROCURONIUM BROMIDE 50 MG/5ML IV SOLN
55.0000 mg | INTRAVENOUS | Status: AC | PRN
  Filled 2013-01-02: qty 12

## 2013-01-02 MED ORDER — HALOPERIDOL LACTATE 5 MG/ML IJ SOLN
1.0000 mg | INTRAMUSCULAR | Status: DC | PRN
  Administered 2013-01-02: 4 mg via INTRAVENOUS
  Administered 2013-01-02: 2 mg via INTRAVENOUS
  Filled 2013-01-02 (×2): qty 1

## 2013-01-02 MED ORDER — FENTANYL CITRATE 0.05 MG/ML IJ SOLN
INTRAMUSCULAR | Status: AC
  Filled 2013-01-02: qty 4

## 2013-01-02 MED ORDER — SUCCINYLCHOLINE CHLORIDE 20 MG/ML IJ SOLN
83.0000 mg | INTRAMUSCULAR | Status: AC | PRN
  Filled 2013-01-02: qty 7.5

## 2013-01-02 MED ORDER — ATROPINE SULFATE 1 MG/ML IJ SOLN: 1.0000 mg | INTRAMUSCULAR | Status: AC | PRN

## 2013-01-02 MED ORDER — SODIUM CHLORIDE 0.9 % IV SOLN
1.0000 mg/h | INTRAVENOUS | Status: DC
  Administered 2013-01-02 – 2013-01-06 (×5): 2 mg/h via INTRAVENOUS
  Filled 2013-01-02 (×5): qty 10

## 2013-01-02 MED ORDER — FENTANYL BOLUS VIA INFUSION
25.0000 ug | Freq: Four times a day (QID) | INTRAVENOUS | Status: DC | PRN
  Filled 2013-01-02: qty 100

## 2013-01-02 MED ORDER — SODIUM GLYCEROPHOSPHATE 1 MMOLE/ML IV SOLN
10.0000 mmol | Freq: Once | INTRAVENOUS | Status: AC
  Administered 2013-01-02: 10 mmol via INTRAVENOUS
  Filled 2013-01-02 (×2): qty 10

## 2013-01-02 MED ORDER — VECURONIUM BROMIDE 10 MG IV SOLR: 5.5000 mg | INTRAVENOUS | Status: AC | PRN

## 2013-01-02 MED ORDER — FENTANYL CITRATE 0.05 MG/ML IJ SOLN
100.0000 ug | Freq: Once | INTRAMUSCULAR | Status: AC
  Administered 2013-01-02: 100 ug via INTRAVENOUS

## 2013-01-02 MED ORDER — SODIUM GLYCEROPHOSPHATE 1 MMOLE/ML IV SOLN
20.0000 mmol | Freq: Once | INTRAVENOUS | Status: AC
  Administered 2013-01-02: 20 mmol via INTRAVENOUS
  Filled 2013-01-02 (×2): qty 20

## 2013-01-02 MED ORDER — MAGNESIUM SULFATE 50 % IJ SOLN
3.0000 g | Freq: Once | INTRAVENOUS | Status: AC
  Administered 2013-01-02: 3 g via INTRAVENOUS
  Filled 2013-01-02: qty 6

## 2013-01-02 MED ORDER — CLINIMIX E/DEXTROSE (5/15) 5 % IV SOLN
INTRAVENOUS | Status: AC
  Administered 2013-01-02: 17:00:00 via INTRAVENOUS
  Filled 2013-01-02: qty 1000

## 2013-01-02 MED ORDER — SODIUM CHLORIDE 0.9 % IV SOLN
25.0000 ug/h | INTRAVENOUS | Status: DC
  Administered 2013-01-02 – 2013-01-04 (×2): 100 ug/h via INTRAVENOUS
  Administered 2013-01-05 (×2): 175 ug/h via INTRAVENOUS
  Administered 2013-01-06: 200 ug/h via INTRAVENOUS
  Administered 2013-01-06 – 2013-01-07 (×2): 100 ug/h via INTRAVENOUS
  Filled 2013-01-02 (×7): qty 50

## 2013-01-02 MED ORDER — VECURONIUM BROMIDE 10 MG IV SOLR
8.0000 mg | Freq: Once | INTRAVENOUS | Status: AC
  Filled 2013-01-02: qty 10

## 2013-01-02 MED ORDER — SODIUM CHLORIDE 0.9 % IV BOLUS (SEPSIS)
1000.0000 mL | Freq: Once | INTRAVENOUS | Status: AC
  Administered 2013-01-02: 1000 mL via INTRAVENOUS

## 2013-01-02 MED ORDER — MIDAZOLAM HCL 2 MG/2ML IJ SOLN
2.0000 mg | INTRAMUSCULAR | Status: DC | PRN
  Administered 2013-01-02 (×2): 2 mg via INTRAVENOUS
  Filled 2013-01-02 (×3): qty 2

## 2013-01-02 MED ORDER — ETOMIDATE 2 MG/ML IV SOLN
16.5000 mg | INTRAVENOUS | Status: AC | PRN
  Administered 2013-01-02: 30 mg via INTRAVENOUS

## 2013-01-02 MED ORDER — SODIUM CHLORIDE 0.9 % IV BOLUS (SEPSIS)
1000.0000 mL | INTRAVENOUS | Status: DC | PRN
  Administered 2013-01-02: 1000 mL via INTRAVENOUS

## 2013-01-02 MED ORDER — VECURONIUM BROMIDE 10 MG IV SOLR
INTRAVENOUS | Status: AC
  Administered 2013-01-02: 8 mg via INTRAVENOUS
  Filled 2013-01-02: qty 10

## 2013-01-02 MED ORDER — LORAZEPAM 2 MG/ML IJ SOLN: 1.0000 mg | Freq: Two times a day (BID) | INTRAMUSCULAR | Status: DC | PRN

## 2013-01-02 MED ORDER — LORAZEPAM 2 MG/ML IJ SOLN
INTRAMUSCULAR | Status: AC
  Administered 2013-01-02: 1 mg via INTRAVENOUS
  Filled 2013-01-02: qty 1

## 2013-01-02 MED ORDER — FENTANYL CITRATE 0.05 MG/ML IJ SOLN
25.0000 ug | INTRAMUSCULAR | Status: DC | PRN
  Administered 2013-01-02: 25 ug via INTRAVENOUS
  Filled 2013-01-02: qty 2

## 2013-01-02 MED ORDER — NOREPINEPHRINE BITARTRATE 1 MG/ML IJ SOLN
2.0000 ug/min | INTRAVENOUS | Status: DC
  Administered 2013-01-02: 10 ug/min via INTRAVENOUS
  Administered 2013-01-02: 6 ug/min via INTRAVENOUS
  Administered 2013-01-03 (×2): 5 ug/min via INTRAVENOUS
  Administered 2013-01-04: 8 ug/min via INTRAVENOUS
  Filled 2013-01-02 (×6): qty 4

## 2013-01-02 MED ORDER — ALBUMIN HUMAN 25 % IV SOLN
25.0000 g | Freq: Four times a day (QID) | INTRAVENOUS | Status: AC
  Administered 2013-01-02 – 2013-01-04 (×7): 25 g via INTRAVENOUS
  Filled 2013-01-02 (×9): qty 100

## 2013-01-02 MED ORDER — MIDAZOLAM BOLUS VIA INFUSION
1.0000 mg | INTRAVENOUS | Status: DC | PRN
  Administered 2013-01-04: 2 mg via INTRAVENOUS
  Filled 2013-01-02: qty 2

## 2013-01-02 MED ORDER — LORAZEPAM BOLUS VIA INFUSION: 1.0000 mg | Freq: Two times a day (BID) | INTRAVENOUS | Status: DC | PRN

## 2013-01-02 MED ORDER — VITAMIN K1 10 MG/ML IJ SOLN
5.0000 mg | Freq: Once | INTRAVENOUS | Status: AC
  Administered 2013-01-02: 5 mg via INTRAVENOUS
  Filled 2013-01-02 (×2): qty 0.5

## 2013-01-02 MED ORDER — LIDOCAINE HCL (CARDIAC) 20 MG/ML IV SOLN: 83.0000 mg | INTRAVENOUS | Status: AC | PRN

## 2013-01-02 NOTE — Progress Notes (Signed)
Subjective: Events noted, pt looks bad today.  CT reviewed  Objective: Vital signs in last 24 hours: Temp:  [96.9 F (36.1 C)-98.6 F (37 C)] 98.6 F (37 C) (04/10 0743) Pulse Rate:  [51-150] 113 (04/10 0600) Resp:  [20-54] 38 (04/10 0700) BP: (64-167)/(0-138) 78/46 mmHg (04/10 0700) SpO2:  [85 %-100 %] 100 % (04/10 0600) Weight:  [81 lb (36.741 kg)] 81 lb (36.741 kg) (04/09 1415) Last BM Date: 01/01/13  Intake/Output from previous day: 04/09 0701 - 04/10 0700 In: 8074.3 [I.V.:6389; NG/GT:650; IV Piggyback:610; TPN:425.3] Out: 6120 [Urine:2870; Emesis/NG output:3250] Intake/Output this shift:    General appearance: delirious and moderate distress Resp: tachypneic Cardio: tachycardic, regular GI: soft, mildly distended, gas in bag l  Lab Results:   Recent Labs  01/01/13 2200 01/02/13 0500  WBC 2.1* 3.3*  HGB 11.2* 11.2*  HCT 31.8* 30.9*  PLT 155 163   BMET  Recent Labs  01/01/13 2200 01/02/13 0500  NA 142 142  K 3.0* 4.4  CL 118* 120*  CO2 13* 11*  GLUCOSE 143* 166*  BUN 15 16  CREATININE 0.79 0.75  CALCIUM 6.1* 6.4*   PT/INR  Recent Labs  01/01/13 1530  LABPROT 31.9*  INR 3.33*   ABG  Recent Labs  01/01/13 1547  PHART 7.347*  HCO3 9.3*    Studies/Results: Ct Abdomen Pelvis W Contrast  01/01/2013  *RADIOLOGY REPORT*  Clinical Data: 47 year old female with a history of rectal cancer status post colectomy with ileoanal anastomosis and diverting loop ileostomy.  She has a history of recurrent small bowel obstruction and now has progressive obstructive symptoms.  CT ABDOMEN AND PELVIS WITH CONTRAST  Technique:  Multidetector CT imaging of the abdomen and pelvis was performed following the standard protocol during bolus administration of intravenous contrast.  Contrast: 80mL OMNIPAQUE IOHEXOL 300 MG/ML  SOLN  Comparison: Most recent prior acute abdominal series 12/31/2012; most recent CT abdomen/pelvis 09/05/2012  Findings:  Lower Chest:  Patchy  consolidation in the left lower lobe.  The distal esophagus is patulous and contains ingested oral contrast material.  The visualized heart is within normal limits for size. No pericardial effusion.  Abdomen: A nasogastric tube terminates in the gastric antrum. Diffuse marked distension of fluid-filled small bowel throughout the abdomen.  Small bowel is dilated up to 3.8 cm in diameter. There appears to be a focal transition point in the right lower quadrants just proximal to the diverting ileostomy.  This is best seen on the coronal view where there is a focal swirling of the mesenteric vessels.  The final few centimeters of the ilium extending to the abdominal wall are decompressed.  Small bowel mucosa enhances throughout.  No evidence of ischemia or closed loop obstruction.  There is a small amount of ascites and mesenteric edema throughout the abdomen.  No free air.  Unremarkable CT appearance of the spleen, pancreas, and liver. Gallbladder is unremarkable. No intra or extrahepatic biliary ductal dilatation.  Unremarkable kidneys and adrenal glands. Extensive postsurgical changes of prior total colectomy with ileoanal anastomosis with residual presacral fluid versus patulousness at the ileoanal anastomosis.  Pelvis: Foley catheter within the bladder.  The intraluminal bladder air is present related to the recent instrumentation.  Bones: No acute fracture or aggressive appearing lytic or blastic osseous lesion.  Vascular: No significant atherosclerotic vascular disease.  IMPRESSION:  1.  Small bowel obstruction with focal transition point in the right lower quadrant subjacent to the diverting ileostomy.  The transition point is best seen on  the coronal images (image 28 of 77, series 400) where there is focal swirling of the mesenteric vessels.  The final few centimeters of the distal ileum is decompressed as it extends through the anterior abdominal wall. Findings suggest underlying adhesive disease.  2. Patchy  consolidation in the left lower lobe concerning for pneumonia versus aspiration.  3.  Surgical changes of prior total colectomy with ileoanal anastomosis and right lower quadrant diverting ileostomy.  4.  Similar appearance of presacral fluid versus patulousness of the ileoanal anastomosis.  5.  Nasogastric tube in place, the tip is in the gastric antrum.   Original Report Authenticated By: Malachy Moan, M.D.    Dg Chest Port 1 View  01/01/2013  *RADIOLOGY REPORT*  Clinical Data: Evaluate right IJ line placement  PORTABLE CHEST - 1 VIEW  Comparison: 12/31/2012  Findings: Grossly unchanged cardiac silhouette and mediastinal contours.  Interval placement of a right jugular approach central venous catheter with tip overlying the superior cavoatrial junction.  Interval placement of enteric tube with tip inside port projected the left hemidiaphragm.  Stable positioning of left anterior chest wall subclavian vein approach port catheter.  No pneumothorax.  Lung volumes are slightly reduced with corresponding interval increase in bibasilar opacities favored to represent atelectasis. No definite pleural effusion.  Unchanged bones.  IMPRESSION: 1.  Right jugular approach central venous catheter tip overlies the superior cavoatrial junction.  No pneumothorax. 2.  Enteric tube tip and side port project below the left hemidiaphragm. 3.  Slightly decreased lung volumes with increased bibasilar atelectasis.   Original Report Authenticated By: Tacey Ruiz, MD    Dg Abd Acute W/chest  12/31/2012  *RADIOLOGY REPORT*  Clinical Data: Abdominal pain.  Nausea and vomiting.  Prior history of rectal cancer with creation of a J pouch and ileoanal anastomosis.  ACUTE ABDOMEN SERIES (ABDOMEN 2 VIEW & CHEST 1 VIEW)  Comparison: Acute abdomen series 12/24/2012.  Two-view abdomen x- rays 12/25/2012, 11/16/2012.  Findings: Progressive distention of multiple small bowel loops since the examination 6 days ago, and there are several more  dilated small bowel loops currently.  Gas is present within the rectum.  No evidence of free intraperitoneal air on the erect image.  Air-fluid levels throughout the dilated small bowel.  Air- fluid level within the stomach.  Phleboliths in the pelvis.  No visible opaque urinary tract calculi.  Regional skeleton unremarkable.  Cardiomediastinal silhouette unremarkable, unchanged.  Lungs clear. Bronchovascular markings normal.  Pulmonary vascularity normal.  No pneumothorax.  No pleural effusions.  Apparent nodular opacity projected over the left lung base is felt to represent the left nipple shadow.  Left subclavian Port-A-Cath tip projects over the upper SVC.  IMPRESSION:  1.  Worsening partial small bowel obstruction.  No free intraperitoneal air. 2.  No acute cardiopulmonary disease.   Original Report Authenticated By: Hulan Saas, M.D.     Anti-infectives: Anti-infectives   Start     Dose/Rate Route Frequency Ordered Stop   01/02/13 0100  meropenem (MERREM) 1 g in sodium chloride 0.9 % 100 mL IVPB     1 g 200 mL/hr over 30 Minutes Intravenous 2 times daily 01/01/13 2354     01/01/13 1600  meropenem (MERREM) 1 g in sodium chloride 0.9 % 100 mL IVPB     1 g 200 mL/hr over 30 Minutes Intravenous STAT 01/01/13 1535 01/01/13 1640      Assessment/Plan: s/p * No surgery found * Probable intubation by CCM Albumin for hypoalbuminemia IV mag and  phos for hypomagnesemia and hypophosphatemia FFP for hypocoagulability.     LOS: 2 days    [facility] 01/02/2013

## 2013-01-02 NOTE — Progress Notes (Signed)
ETT pulled back 1cm and secured at 22cm at the lip per MD post CXR.

## 2013-01-02 NOTE — Progress Notes (Signed)
eLink Physician-Brief Progress Note Patient Name: Renee Nicholson DOB: 05-Dec-1965 MRN: [id number]  Date of Service  01/02/2013   HPI/Events of Note  Hypotension with tachycardia. Has received 6 liters of fluid but UOP is close to 2.5 liters.  Has CVP of 5.   eICU Interventions  Plan: 1 liter of NS for BP support in the setting of tachycardia Continue to monitor   Intervention Category Intermediate Interventions: Hypotension - evaluation and management;Arrhythmia - evaluation and management  DETERDING,ELIZABETH 01/02/2013, 4:16 AM

## 2013-01-02 NOTE — Progress Notes (Signed)
Patient status still very tenuous.  Had been on sepsis protocol, but that has been discontinued.  K+ 3.0 and being replaced.  Still very large amount of NGT output, very foul-smelling.  Abdomen is diffusely, but mildly tender.  Patient very anxious.  Will allow some ice chips.  Still tachycardic in the 140's.  May need re-exploration in the future.  Marta Lamas. Gae Bon, MD, FACS [phone] [facility] Surgery

## 2013-01-02 NOTE — Progress Notes (Signed)
CRITICAL VALUE ALERT  Critical value received: WBC 1.2  Date of notification:  01/02/13  Time of notification:  1320  Critical value read back:yes  Nurse who received alert:  Kandace Parkins, RN  MD notified (1st page):  Christen Bame, MD  Time of first page:  1325  MD notified (2nd page):  Time of second page:  Responding MD:  Christen Bame, MD  Time MD responded:  [phone]

## 2013-01-02 NOTE — Progress Notes (Addendum)
PARENTERAL NUTRITION CONSULT NOTE - Follow Up  Pharmacy Consult for TPN Indication: SBO  No Known Allergies  Patient Measurements: Height: 4\' 5"  (134.6 cm) Weight: 81 lb (36.741 kg) IBW/kg (Calculated) : 29.4 Usual Weight: 95 lb  Vital Signs: Temp: 98.6 F (37 C) (04/10 0743) Temp src: Oral (04/10 0743) BP: 78/46 mmHg (04/10 0700) Pulse Rate: 113 (04/10 0600) Intake/Output from previous day: 04/09 0701 - 04/10 0700 In: 8074.3 [I.V.:6389; NG/GT:650; IV Piggyback:610; TPN:425.3] Out: 6120 [Urine:2870; Emesis/NG output:3250] Intake/Output from this shift:    Labs:  Recent Labs  01/01/13 1530 01/01/13 2200 01/02/13 0500  WBC 2.0* 2.1* 3.3*  HGB 10.8* 11.2* 11.2*  HCT 30.3* 31.8* 30.9*  PLT 169 155 163  APTT 34  --   --   INR 3.33*  --   --      Recent Labs  01/01/13 1530 01/01/13 2200 01/02/13 0500  NA 140 142 142  K 3.8 3.0* 4.4  CL 114* 118* 120*  CO2 13* 13* 11*  GLUCOSE 112* 143* 166*  BUN 23 15 16   CREATININE 1.01 0.79 1.61  CALCIUM 6.5* 6.1* 6.4*  MG  --   --  1.3*  PHOS  --   --  2.2*  PROT 4.1* 4.0* 4.1*  ALBUMIN 1.9* 1.8* 1.7*  AST 22 47* 68*  ALT 12 23 34  ALKPHOS 33* 32* 33*  BILITOT 0.7 0.4 0.3  TRIG  --   --  169*  CHOL  --   --  66   Estimated Creatinine Clearance: 44.3 ml/min (by C-G formula based on Cr of 0.75).    Recent Labs  01/01/13 1912 01/01/13 2342 01/02/13 0427  GLUCAP 94 111* 140*    Medical History: Past Medical History  Diagnosis Date  . Anemia   . Diarrhea   . Anxiety   . Bright red rectal bleeding 09/13/2011  . History of chemotherapy     completed 09/2011   . Hx of radiation therapy 08/29/11 to 10/09/11    rectum  . GERD (gastroesophageal reflux disease)   . Ileostomy in place   . Cancer   . Rectal cancer     Medications:  Prescriptions prior to admission  Medication Sig Dispense Refill  . B Complex-C (B-COMPLEX WITH VITAMIN C) tablet Take 1 tablet by mouth daily.      . ferrous sulfate 325 (65  FE) MG tablet Take 325 mg by mouth daily with breakfast.      . lactose free nutrition (BOOST) LIQD Take 237 mLs by mouth 2 (two) times daily.      . Multiple Vitamin (MULTIVITAMIN) tablet Take 1 tablet by mouth every morning.       . pantoprazole (PROTONIX) 40 MG tablet Take 40 mg by mouth every morning.       . potassium chloride SA (K-DUR,KLOR-CON) 20 MEQ tablet Take 20 mEq by mouth every morning.      . Simethicone 125 MG CAPS Take 1 capsule by mouth 4 (four) times daily as needed (for gas or upset stomach).         Insulin Requirements in the past 24 hours:  1 unit  Current Nutrition:  NPO  Assessment:  47 yo lady with history of total colectomy with ileo-anal anastomosis and diverting loop ileostomy in March 2013. Complicated by stricture requiring dilatation. This is now the 4th episode of small bowel obstruction requiring admission(she was discharged on 4/4). Patient presents with 24 hr of minimal ostomy output, abdominal and back  pain, nausea and emesis. Xray in ER shows findings consistent with SBO.  Lytes:  K 4.4, mag 1.3, Phos 2.2, Ca 6.4, Alb 1.7, Corrected Ca 8.24 Cards:  Pt experienced tachycardia and hypotension requiring transfer to ICU.  BP 78/46  HR 113   GI:  AST slightly elevated  NGT with 3250 ml out  Plan OR ileostomy takedown and LOA.   Nutritional Goals:  1000-1150 kCal, 50-65 grams of protein per day Clinimix E 5/15 at 50 ml/hr will provide 60 gm protein daily and aver 1058 Kcal ( with lipids MWF)  Plan:  Increase Clinimix E 5/15 to 40 ml/hr. Will replete phos with glycophos now and 10 mmol later today as per MD and replete mag with 3 gm Cont sens ssi.  May dc if tolerates TPN Additional fluids as per MD Lipids, MVI and trace elements on MWF only due to national backorder. F/u am labs   Javonte Elenes Poteet 01/02/2013,7:45 AM

## 2013-01-02 NOTE — Procedures (Signed)
Intubation Procedure Note Renee Nicholson [id number] 11-Nov-1965  Procedure: Intubation Indications: Respiratory insufficiency  Procedure Details Consent: Risks of procedure as well as the alternatives and risks of each were explained to the (patient/caregiver).  Consent for procedure obtained. Time Out: Verified patient identification, verified procedure, site/side was marked, verified correct patient position, special equipment/implants available, medications/allergies/relevent history reviewed, required imaging and test results available.  Performed  Maximum sterile technique was used including gloves, hand hygiene and mask.  MAC and 3    Evaluation Hemodynamic Status: Persistent hypotension treated with pressors and fluid; O2 sats: transiently fell during during procedure Patient's Current Condition: stable Complications: No apparent complications Patient did tolerate procedure well. Chest X-ray ordered to verify placement.  CXR: pending.   Danford Bad, NP 01/02/2013  Levy Pupa, MD, PhD 01/02/2013, 9:54 AM [facility] [phone] or if no answer [phone]

## 2013-01-02 NOTE — Progress Notes (Signed)
Elink aware of pt's coox of 62. No new orders at this time.

## 2013-01-02 NOTE — Progress Notes (Signed)
NUTRITION FOLLOW UP  Intervention:   1.  Parenteral nutrition; management per PharmD.   2.  Nutrition-related labs; replete Mg and Phos as needed  Nutrition Dx:   Inadequate oral intake, ongoing.  Monitor:   1.  Food/Beverage; resume of PO diet once appropriate.  Not met, NPO for upcoming surgery 2.  Parenteral nutrition; ongoing tolerance with pt meeting >/=90% estimated needs.  Ongoing.  Pt still in process of advancements 3.  Wt/wt change; monitor trends.  Ongoing.   Assessment:   Pt admitted with abdominal pain, nausea, and vomiting. Pt now intubated and planning for OR today. Discussed in rounds.  Family asleep at bedside.  Patient is currently intubated on ventilator support.  MV: 10.2 L/min Temp:Temp (24hrs), Avg:98.8 F (37.1 C), Min:96.9 F (36.1 C), Max:100.6 F (38.1 C)  Patient is receiving TPN with Clinimix E 5/15 @ 30 ml/hr.  Lipids (20% IVFE @ 10 ml/hr), multivitamins, and trace elements are provided 3 times weekly (MWF) due to national backorder.  Provides 716 kcal and 36 grams protein daily (based on weekly average).  Meets 57% minimum estimated kcal and 72% minimum estimated protein needs.  RD to follow for ongoing needs assessement.  Unable to perform nutrition focused physical exam at this time.   Height: Ht Readings from Last 1 Encounters:  01/01/13 4\' 5"  (1.346 m)    Weight Status:   Wt Readings from Last 1 Encounters:  01/01/13 81 lb (36.741 kg)    Re-estimated needs:  Kcal: 1246 Protein: 50-60g Fluid: ~1.5 L/day  Skin: intact  Diet Order: NPO   Intake/Output Summary (Last 24 hours) at 01/02/13 1114 Last data filed at 01/02/13 1035  Gross per 24 hour  Intake 9039.25 ml  Output   6645 ml  Net 2394.25 ml    Last BM: 4/9   Labs:   Recent Labs Lab 01/01/13 1530 01/01/13 2200 01/02/13 0500  NA 140 142 142  K 3.8 3.0* 4.4  CL 114* 118* 120*  CO2 13* 13* 11*  BUN 23 15 16   CREATININE 1.01 0.79 0.75  CALCIUM 6.5* 6.1* 6.4*  MG   --   --  1.3*  PHOS  --   --  2.2*  GLUCOSE 112* 143* 166*    CBG (last 3)   Recent Labs  01/01/13 2342 01/02/13 0427 01/02/13 0742  GLUCAP 111* 140* 133*    Scheduled Meds: . albumin human  25 g Intravenous Q6H  . antiseptic oral rinse  15 mL Mouth Rinse q12n4p  . chlorhexidine  15 mL Mouth Rinse BID  . insulin aspart  0-9 Units Subcutaneous Q4H  . magnesium sulfate 1 - 4 g bolus IVPB  3 g Intravenous Once  . meropenem (MERREM) IV  1 g Intravenous BID  . pantoprazole (PROTONIX) IV  40 mg Intravenous Q12H  . sodium chloride  10-40 mL Intracatheter Q12H  . sodium glycerophosphate 0.9% NaCl IVPB  20 mmol Intravenous Once   Followed by  . sodium glycerophosphate 0.9% NaCl IVPB  10 mmol Intravenous Once    Continuous Infusions: . Marland KitchenTPN (CLINIMIX-E) Adult    . sodium chloride 1,000 mL (01/01/13 2347)  . sodium chloride 999 mL/hr at 01/01/13 1315  . Marland KitchenTPN (CLINIMIX-E) Adult 30 mL/hr at 01/01/13 1751   And  . fat emulsion 250 mL (01/01/13 1751)  . norepinephrine (LEVOPHED) Adult infusion 8 mcg/min (01/02/13 1016)    Loyce Dys, MS RD LDN Clinical Inpatient Dietitian Pager: [phone] Weekend/After hours pager: [phone]

## 2013-01-02 NOTE — Progress Notes (Signed)
UR Completed.  Finley Dinkel Jane [phone] 01/02/2013  

## 2013-01-02 NOTE — Progress Notes (Signed)
Notified Dr. Zada Girt of pt's anxiety. Pt stating "I am scared." HR 150s and respiratory rate in the 50s-60s. PRN ativan ordered. Will continue to monitor.

## 2013-01-02 NOTE — Progress Notes (Signed)
Notified MD of pt's heart rate in the 150s and respiratory rate in the 50s. Pt's CVP 5. 1L bolus ordered. Will continue to monitor.

## 2013-01-02 NOTE — Progress Notes (Signed)
PULMONARY  / CRITICAL CARE MEDICINE  Name: Renee Nicholson MRN: [id number] DOB: Jan 03, 1966    ADMISSION DATE:  12/31/2012 CONSULTATION DATE:  01/01/2013  REFERRING MD :  Donell Beers PRIMARY SERVICE: Surgery  CHIEF COMPLAINT:  Shock  BRIEF PATIENT DESCRIPTION: 47 year old female with a h/o Rectal CA, GERD, recurrent SBO, total colectomy with ileo-anal anastomosis and diverting loop ileostomy. Presented 4/8 with a 24 hour history of abdomnial pain, N/V, and minimal ostomy output. She was admitted for SBO. 4/9 she became tachycardiac and hypotensive. PCCM has been asked to see.   SIGNIFICANT EVENTS / STUDIES:  4/8 - Abd XRay - Small Bowel Obstruction. No peritoneal air.  4/9 - CT abd/pelvis >>> ?LLL consolidation, SBO and adhesions, no free air 4/10 - ETT for tachypnea   LINES / TUBES: CVL 4/9 >>> NGT 4/9 >>> RIJ 4/9 >>>  CULTURES: BC x 2 4/9 >>> Urine 4/9 >>> Sputum 4/9 >>>  ANTIBIOTICS:  Meropenem 4/9 >>>  HISTORY OF PRESENT ILLNESS:  47 year old female with a h/o Rectal CA, GERD, and bowel obstruction. 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 She has had multiple small bowel obstructions requiring admission. Just discharged 4/4 after being treated medically for SBO.  4/7 she began having abdominal pain with markedly decreased ostomy output. Pain radiated to back. Also had nausea and vomiting. Abd XRay was consistent with SBO. She was admitted to inpatient at [facility]. 4/9 became hypotensive and tachycardic. She was transferred to ICU and PCCM has been asked to consult in this setting.    SUBJECTIVE: continued hypotension and increased tachypnea, abd pain  VITAL SIGNS: Temp:  [96.9 F (36.1 C)-98.3 F (36.8 C)] 98 F (36.7 C) (04/09 2340) Pulse Rate:  [51-150] 113 (04/10 0600) Resp:  [20-54] 38 (04/10 0700) BP: (64-167)/(0-138) 78/46 mmHg (04/10 0700) SpO2:  [85 %-100 %] 100 % (04/10 0600) Weight:  [81 lb (36.741 kg)] 81 lb (36.741 kg)  (04/09 1415) HEMODYNAMICS: CVP:  [0 mmHg-11 mmHg] 0 mmHg VENTILATOR SETTINGS:   INTAKE / OUTPUT: Intake/Output     04/09 0701 - 04/10 0700 04/10 0701 - 04/11 0700   I.V. (mL/kg) 6389 (173.9)    NG/GT 650    IV Piggyback 610    TPN 425.3    Total Intake(mL/kg) 8074.3 (219.8)    Urine (mL/kg/hr) 2870 (3.3)    Emesis/NG output 3250 (3.7)    Stool     Total Output 6120     Net +1954.3          Urine Occurrence 1 x    Emesis Occurrence 3 x      PHYSICAL EXAMINATION: General:  Petite female, acutely ill in appearance. Neuro:  Alert, oriented. Follows commands HEENT:  [place], no JVD Cardiovascular:  tachy no m/r/g Lungs:  tachypnic, Clear bilaterally.  Abdomen:  GI tube draining moderate amount green/brown gastric secretions Musculoskeletal:  Intact Skin:  Intact  LABS:  Recent Labs Lab 12/31/12 1225 01/01/13 1530 01/01/13 1547 01/01/13 2200 01/02/13 0500 01/02/13 0600  HGB 13.2 10.8*  --  11.2* 11.2*  --   WBC 12.7* 2.0*  --  2.1* 3.3*  --   PLT 261 169  --  155 163  --   NA 134* 140  --  142 142  --   K 3.5 3.8  --  3.0* 4.4  --   CL 93* 114*  --  118* 120*  --   CO2 27 13*  --  13* 11*  --   GLUCOSE 114* 112*  --  143* 166*  --   BUN 15 23  --  15 16  --   CREATININE 0.58 1.01  --  0.79 0.75  --   CALCIUM 9.6 6.5*  --  6.1* 6.4*  --   MG  --   --   --   --  1.3*  --   PHOS  --   --   --   --  2.2*  --   AST 19 22  --  47* 68*  --   ALT 18 12  --  23 34  --   ALKPHOS 91 33*  --  32* 33*  --   BILITOT 1.0 0.7  --  0.4 0.3  --   PROT 7.5 4.1*  --  4.0* 4.1*  --   ALBUMIN 4.1 1.9*  --  1.8* 1.7*  --   APTT  --  34  --   --   --   --   INR  --  3.33*  --   --   --   --   LATICACIDVEN  --  6.0*  --   --   --  3.8*  TROPONINI  --  <0.30  --   --   --   --   PROCALCITON  --  61.35  --   --   --   --   PHART  --   --  7.347*  --   --   --   PCO2ART  --   --  16.9*  --   --   --   PO2ART  --   --  73.0*  --   --   --     Recent Labs Lab 01/01/13 1515  01/01/13 1912 01/01/13 2342 01/02/13 0427  GLUCAP 112* 94 111* 140*    CXR: 4/9: No acute cardiopulmonary process.    ASSESSMENT / PLAN:  PULMONARY A: Tachypnea - in setting of SIRS, CXR clear.  P:   Increased tachypnea and hypoxia ETT  CARDIOVASCULAR A: Shock hypovolemic + septic Sinus tachy P:  Fluid Resuscitation. Target MAP: > 65 mm/Hg target CVP:  > 8 mm/Hg LIJ in place  abx as below   RENAL A:  No Acute Issue P:   Monitor renal function/lytes closely Will replace as needed   GASTROINTESTINAL A:  Small Bowel Obstrucition w/ complicated surgical bowel history. NG output ~2L in 24hr. Continued involuntary guarding.  Hx of colon CA, many sgy, chemo, XRT P:   -NGT to intermittent suction -Surgery following -->will go for exploration on 4/10 -TPN per surgery  HEMATOLOGIC A:  Baseline microcytic anemia. Hgb Stable. INR 1.5>>3.3 P:  -Monitor/Supportive Care -hold lovenox.  -FFP, received Vit K 4/9  INFECTIOUS A:  Sepsis, presumed source intra-abd. Leukocytosis down trending. No fever. Lactic acid downtrending 6.0>>3.8 4/10  P:   Cont meropenum (4/9) Cultures pending Check PCT Trend WBC and fever curve.   ENDOCRINE A:  No Acute Issue   P:   -SSI  NEUROLOGIC A:  Pt baseline anxiety disorder and acute Encephalopathy - in setting of shock P:   Supportive Care ativan prn >> transition to   TODAY'S SUMMARY:  -surgery to explore once more stable -cont fluid resuscitation -intubate, sedate  Christen Bame, MD PGY-1 Pgr: [phone]  CC time 90 minutes  Levy Pupa, MD, PhD 01/02/2013, 10:14 AM [facility] [phone] or if no answer  [phone]   

## 2013-01-03 ENCOUNTER — Inpatient Hospital Stay (HOSPITAL_COMMUNITY): Payer: BC Managed Care – PPO | Admitting: Anesthesiology

## 2013-01-03 ENCOUNTER — Encounter (HOSPITAL_COMMUNITY): Payer: Self-pay | Admitting: Anesthesiology

## 2013-01-03 ENCOUNTER — Encounter (HOSPITAL_COMMUNITY)
Admission: EM | Disposition: A | Discharge status: Rehabilitation Facility | Payer: Self-pay | Source: Home / Self Care | Attending: General Surgery

## 2013-01-03 DIAGNOSIS — C20 Malignant neoplasm of rectum: Secondary | ICD-10-CM

## 2013-01-03 HISTORY — PX: LAPAROTOMY: SHX154

## 2013-01-03 LAB — PREPARE FRESH FROZEN PLASMA
Unit division: 0
Unit division: 0

## 2013-01-03 LAB — POCT I-STAT 7, (LYTES, BLD GAS, ICA,H+H)
Calcium, Ion: 1.17 mmol/L (ref 1.12–1.23)
HCT: 30 % — ABNORMAL LOW (ref 36.0–46.0)
Hemoglobin: 10.2 g/dL — ABNORMAL LOW (ref 12.0–15.0)
Patient temperature: 36.7
Potassium: 4.2 mEq/L (ref 3.5–5.1)
TCO2: 18 mmol/L (ref 0–100)
pCO2 arterial: 43.8 mmHg (ref 35.0–45.0)
pH, Arterial: 7.189 — CL (ref 7.350–7.450)

## 2013-01-03 LAB — CBC
Hemoglobin: 7.8 g/dL — ABNORMAL LOW (ref 12.0–15.0)
MCH: 29.4 pg (ref 26.0–34.0)
MCHC: 36.8 g/dL — ABNORMAL HIGH (ref 30.0–36.0)
MCV: 79.9 fL (ref 78.0–100.0)
Platelets: 84 10*3/uL — ABNORMAL LOW (ref 150–400)
Platelets: 61 10*3/uL — ABNORMAL LOW (ref 150–400)
RBC: 2.63 MIL/uL — ABNORMAL LOW (ref 3.87–5.11)
WBC: 1.4 10*3/uL — CL (ref 4.0–10.5)

## 2013-01-03 LAB — BASIC METABOLIC PANEL
BUN: 15 mg/dL (ref 6–23)
Calcium: 7.8 mg/dL — ABNORMAL LOW (ref 8.4–10.5)
Calcium: 7 mg/dL — ABNORMAL LOW (ref 8.4–10.5)
Creatinine, Ser: 0.52 mg/dL (ref 0.50–1.10)
GFR calc Af Amer: >90 mL/min (ref >90)
GFR calc Af Amer: >90 mL/min (ref >90)
GFR calc non Af Amer: >90 mL/min (ref >90)
GFR calc non Af Amer: >90 mL/min (ref >90)
Glucose, Bld: 150 mg/dL — ABNORMAL HIGH (ref 70–99)
Potassium: 2.8 mEq/L — ABNORMAL LOW (ref 3.5–5.1)
Sodium: 144 mEq/L (ref 135–145)

## 2013-01-03 LAB — PHOSPHORUS: Phosphorus: 1.8 mg/dL — ABNORMAL LOW (ref 2.3–4.6)

## 2013-01-03 LAB — PROTIME-INR
INR: 1.77 — ABNORMAL HIGH (ref 0.00–1.49)
Prothrombin Time: 20 seconds — ABNORMAL HIGH (ref 11.6–15.2)
Prothrombin Time: 17.9 seconds — ABNORMAL HIGH (ref 11.6–15.2)

## 2013-01-03 LAB — MAGNESIUM: Magnesium: 2.3 mg/dL (ref 1.5–2.5)

## 2013-01-03 LAB — POCT I-STAT 3, ART BLOOD GAS (G3+)
Acid-base deficit: 8 mmol/L — ABNORMAL HIGH (ref 0.0–2.0)
O2 Saturation: 89 %

## 2013-01-03 LAB — GLUCOSE, CAPILLARY
Glucose-Capillary: 126 mg/dL — ABNORMAL HIGH (ref 70–99)
Glucose-Capillary: 136 mg/dL — ABNORMAL HIGH (ref 70–99)
Glucose-Capillary: 144 mg/dL — ABNORMAL HIGH (ref 70–99)

## 2013-01-03 LAB — PROCALCITONIN: Procalcitonin: 46.27 ng/mL

## 2013-01-03 SURGERY — LAPAROTOMY, EXPLORATORY
Anesthesia: General | Site: Abdomen | Wound class: Dirty or Infected

## 2013-01-03 MED ORDER — POTASSIUM CHLORIDE 10 MEQ/50ML IV SOLN
10.0000 meq | INTRAVENOUS | Status: AC
  Administered 2013-01-03 – 2013-01-04 (×6): 10 meq via INTRAVENOUS
  Filled 2013-01-03 (×2): qty 50
  Filled 2013-01-03: qty 100
  Filled 2013-01-03 (×2): qty 50

## 2013-01-03 MED ORDER — PHENYLEPHRINE HCL 10 MG/ML IJ SOLN
INTRAMUSCULAR | Status: DC | PRN
  Administered 2013-01-03: 80 ug via INTRAVENOUS
  Administered 2013-01-03 (×2): 120 ug via INTRAVENOUS

## 2013-01-03 MED ORDER — SODIUM CHLORIDE 0.9 % IV SOLN
10.0000 mg | INTRAVENOUS | Status: DC | PRN
  Administered 2013-01-03: 10 ug/min via INTRAVENOUS

## 2013-01-03 MED ORDER — SODIUM CHLORIDE 0.9 % IR SOLN
Status: DC | PRN
  Administered 2013-01-03 (×3): 1000 mL

## 2013-01-03 MED ORDER — SODIUM PHOSPHATE 3 MMOLE/ML IV SOLN
20.0000 mmol | Freq: Once | INTRAVENOUS | Status: DC
  Filled 2013-01-03: qty 6.67

## 2013-01-03 MED ORDER — SODIUM CHLORIDE 0.9 % IV SOLN
2500.0000 ug | INTRAVENOUS | Status: DC | PRN
  Administered 2013-01-03: 100 ug/h via INTRAVENOUS

## 2013-01-03 MED ORDER — POTASSIUM CHLORIDE 10 MEQ/50ML IV SOLN
10.0000 meq | INTRAVENOUS | Status: AC
  Administered 2013-01-03 (×8): 10 meq via INTRAVENOUS
  Filled 2013-01-03: qty 350
  Filled 2013-01-03: qty 50

## 2013-01-03 MED ORDER — ROCURONIUM BROMIDE 100 MG/10ML IV SOLN
INTRAVENOUS | Status: DC | PRN
  Administered 2013-01-03 (×2): 50 mg via INTRAVENOUS

## 2013-01-03 MED ORDER — TRACE MINERALS CR-CU-F-FE-I-MN-MO-SE-ZN IV SOLN
INTRAVENOUS | Status: AC
  Administered 2013-01-03: 18:00:00 via INTRAVENOUS
  Filled 2013-01-03: qty 1000

## 2013-01-03 MED ORDER — FAT EMULSION 20 % IV EMUL
250.0000 mL | INTRAVENOUS | Status: AC
  Administered 2013-01-03: 250 mL via INTRAVENOUS
  Filled 2013-01-03: qty 250

## 2013-01-03 MED ORDER — LACTATED RINGERS IV SOLN
INTRAVENOUS | Status: DC | PRN
  Administered 2013-01-03: 13:00:00 via INTRAVENOUS

## 2013-01-03 MED ORDER — POTASSIUM CHLORIDE 20 MEQ/15ML (10%) PO LIQD
40.0000 meq | ORAL | Status: DC
  Filled 2013-01-03 (×2): qty 30

## 2013-01-03 MED ORDER — MIDAZOLAM HCL 5 MG/5ML IJ SOLN
INTRAMUSCULAR | Status: DC | PRN
  Administered 2013-01-03: 2 mg via INTRAVENOUS

## 2013-01-03 MED ORDER — SODIUM BICARBONATE 8.4 % IV SOLN
INTRAVENOUS | Status: DC | PRN
  Administered 2013-01-03 (×3): 50 meq via INTRAVENOUS

## 2013-01-03 MED ORDER — ALBUMIN HUMAN 5 % IV SOLN
INTRAVENOUS | Status: DC | PRN
  Administered 2013-01-03: 15:00:00 via INTRAVENOUS

## 2013-01-03 MED ORDER — POTASSIUM CHLORIDE 10 MEQ/50ML IV SOLN
INTRAVENOUS | Status: DC | PRN
  Administered 2013-01-03: 10 meq via INTRAVENOUS

## 2013-01-03 MED ORDER — SODIUM GLYCEROPHOSPHATE 1 MMOLE/ML IV SOLN
20.0000 mmol | Freq: Once | INTRAVENOUS | Status: AC
  Administered 2013-01-03: 20 mmol via INTRAVENOUS
  Filled 2013-01-03: qty 20

## 2013-01-03 SURGICAL SUPPLY — 52 items
BANDAGE GAUZE ELAST BULKY 4 IN (GAUZE/BANDAGES/DRESSINGS) ×2 IMPLANT
BLADE SURG ROTATE 9660 (MISCELLANEOUS) IMPLANT
CANISTER SUCTION 2500CC (MISCELLANEOUS) ×2 IMPLANT
CHLORAPREP W/TINT 26ML (MISCELLANEOUS) ×2 IMPLANT
CLAMP POUCH DRAINAGE QUIET (OSTOMY) ×2 IMPLANT
CLOTH BEACON ORANGE TIMEOUT ST (SAFETY) ×2 IMPLANT
COVER SURGICAL LIGHT HANDLE (MISCELLANEOUS) ×2 IMPLANT
DRAPE LAPAROSCOPIC ABDOMINAL (DRAPES) ×2 IMPLANT
DRAPE UTILITY 15X26 W/TAPE STR (DRAPE) ×4 IMPLANT
DRAPE WARM FLUID 44X44 (DRAPE) ×2 IMPLANT
DRSG PAD ABDOMINAL 8X10 ST (GAUZE/BANDAGES/DRESSINGS) ×2 IMPLANT
ELECT BLADE 6.5 EXT (BLADE) IMPLANT
ELECT CAUTERY BLADE 6.4 (BLADE) ×2 IMPLANT
ELECT REM PT RETURN 9FT ADLT (ELECTROSURGICAL) ×2
ELECTRODE REM PT RTRN 9FT ADLT (ELECTROSURGICAL) ×1 IMPLANT
GLOVE BIO SURGEON STRL SZ 6 (GLOVE) ×6 IMPLANT
GLOVE BIO SURGEON STRL SZ 6.5 (GLOVE) ×4 IMPLANT
GLOVE BIOGEL PI IND STRL 6.5 (GLOVE) ×1 IMPLANT
GLOVE BIOGEL PI IND STRL 7.0 (GLOVE) ×1 IMPLANT
GLOVE BIOGEL PI INDICATOR 6.5 (GLOVE) ×1
GLOVE BIOGEL PI INDICATOR 7.0 (GLOVE) ×1
GLOVE SURG SS PI 6.5 STRL IVOR (GLOVE) ×2 IMPLANT
GOWN PREVENTION PLUS XXLARGE (GOWN DISPOSABLE) ×4 IMPLANT
GOWN STRL NON-REIN LRG LVL3 (GOWN DISPOSABLE) ×4 IMPLANT
KIT BASIN OR (CUSTOM PROCEDURE TRAY) ×2 IMPLANT
KIT ROOM TURNOVER OR (KITS) ×2 IMPLANT
LIGASURE IMPACT 36 18CM CVD LR (INSTRUMENTS) IMPLANT
NS IRRIG 1000ML POUR BTL (IV SOLUTION) ×4 IMPLANT
PACK GENERAL/GYN (CUSTOM PROCEDURE TRAY) ×2 IMPLANT
PAD ARMBOARD 7.5X6 YLW CONV (MISCELLANEOUS) ×2 IMPLANT
POUCH OSTOMY 2  H (OSTOMY) ×2 IMPLANT
SEPRAFILM PROCEDURAL PACK 3X5 (MISCELLANEOUS) ×2 IMPLANT
SPECIMEN JAR LARGE (MISCELLANEOUS) IMPLANT
SPONGE GAUZE 4X4 12PLY (GAUZE/BANDAGES/DRESSINGS) ×2 IMPLANT
SPONGE LAP 18X18 X RAY DECT (DISPOSABLE) ×8 IMPLANT
STAPLER VISISTAT 35W (STAPLE) ×2 IMPLANT
SUCTION POOLE TIP (SUCTIONS) ×2 IMPLANT
SURGILUBE 2OZ TUBE FLIPTOP (MISCELLANEOUS) ×2 IMPLANT
SUT PDS II 0 TP-1 LOOPED 60 (SUTURE) ×4 IMPLANT
SUT VIC AB 2-0 SH 18 (SUTURE) ×2 IMPLANT
SUT VIC AB 3-0 SH 18 (SUTURE) ×2 IMPLANT
SUT VIC AB 4-0 RB1 18 (SUTURE) ×4 IMPLANT
SUT VICRYL 4-0 PS2 18IN ABS (SUTURE) IMPLANT
SUT VICRYL AB 2 0 TIES (SUTURE) ×2 IMPLANT
SUT VICRYL AB 3 0 TIES (SUTURE) ×2 IMPLANT
TAPE CLOTH SURG 6X10 WHT LF (GAUZE/BANDAGES/DRESSINGS) ×2 IMPLANT
TOWEL OR 17X24 6PK STRL BLUE (TOWEL DISPOSABLE) ×2 IMPLANT
TOWEL OR 17X26 10 PK STRL BLUE (TOWEL DISPOSABLE) ×2 IMPLANT
TRAY FOLEY CATH 14FRSI W/METER (CATHETERS) IMPLANT
UNDERPAD 30X30 INCONTINENT (UNDERPADS AND DIAPERS) ×2 IMPLANT
WATER STERILE IRR 1000ML POUR (IV SOLUTION) IMPLANT
YANKAUER SUCT BULB TIP NO VENT (SUCTIONS) IMPLANT

## 2013-01-03 NOTE — Progress Notes (Signed)
Nutrition Brief Note  Patient followed by clinical nutrition staff for malnutrition requiring nutrition support.  Pt currently intubated and in OR for surgery.  Pt with high risk for refeeding syndrome.  Sodium  Date/Time Value Range Status  01/03/2013  4:55 AM 144  135 - 145 mEq/L Final  01/02/2013 11:30 AM 143  135 - 145 mEq/L Final  01/02/2013  5:00 AM 142  135 - 145 mEq/L Final  12/12/2012 10:37 AM 139  136 - 145 mEq/L Final  11/14/2012 11:12 AM 140  136 - 145 mEq/L Final  10/17/2012 11:01 AM 137  136 - 145 mEq/L Final    Potassium  Date/Time Value Range Status  01/03/2013  4:55 AM 2.8* 3.5 - 5.1 mEq/L Final     DELTA CHECK NOTED  01/02/2013 11:30 AM 3.4* 3.5 - 5.1 mEq/L Final     DELTA CHECK NOTED  01/02/2013  5:00 AM 4.4  3.5 - 5.1 mEq/L Final     DELTA CHECK NOTED  12/12/2012 10:37 AM 3.8  3.5 - 5.1 mEq/L Final  11/14/2012 11:12 AM 3.5  3.5 - 5.1 mEq/L Final  10/17/2012 11:01 AM 4.2  3.5 - 5.1 mEq/L Final    Phosphorus  Date/Time Value Range Status  01/03/2013  4:55 AM 1.8* 2.3 - 4.6 mg/dL Final  6/57/8469  6:29 AM 2.2* 2.3 - 4.6 mg/dL Final  52/04/4131  4:40 AM 3.9  2.3 - 4.6 mg/dL Final    Magnesium  Date/Time Value Range Status  01/03/2013  4:55 AM 2.3  1.5 - 2.5 mg/dL Final  09/27/7251  6:64 AM 1.3* 1.5 - 2.5 mg/dL Final  40/11/4740  5:95 AM 1.2* 1.5 - 2.5 mg/dL Final    Agree with slow advancement due to evidence of refeeding with electrolyte shifts.   Need remain as previously estimated.  No new nutrition interventions warranted at this time. If nutrition issues arise, please consult RD.  Will continue to follow.  Loyce Dys, MS RD LDN Clinical Inpatient Dietitian Pager: [phone] Weekend/After hours pager: [phone]

## 2013-01-03 NOTE — Op Note (Signed)
PRE-OPERATIVE DIAGNOSIS: small bowel obstruction  POST-OPERATIVE DIAGNOSIS:  Same  PROCEDURE:  Procedure(s): Exploratory laparotomy, lysis of adhesions, ileostomy revision.    SURGEON:  Surgeon(s): [name], MD  ASSISTANT:   Romie Levee, MD  ANESTHESIA:   general  DRAINS: none   LOCAL MEDICATIONS USED:  NONE  SPECIMEN:  Source of Specimen:  ileostomy  DISPOSITION OF SPECIMEN:  PATHOLOGY  COUNTS:  YES  DICTATION: .Dragon Dictation  PLAN OF CARE: Admit to inpatient   PATIENT DISPOSITION:  ICU - intubated and critically ill.  PROCEDURE:  Pt was identified in the ICU and taken straight back to the OR.  Her abdomen was prepped and draped in sterile fashion.  Timeout was performed according to the surgical safety checklist.  When all was correct, we continued.    A midline incision was made with a #10 blade.  The subcutaneous tissue was divided with the cautery.  The fascia was entered sharply.  The small bowel was eviscerated and the obstruction was found at the ostomy.  The entire ostomy was twisted on itself.  The small bowel was decompressed via the ostomy.  The ostomy mucosa was dead on the surface.  The ostomy was taken down from the surrounding tissue with cautery and blunt/sharp dissection.  Both ends of the ostomy were debrided back to bleeding tissue, and the stricture from the chronic obstruction was removed. The abdomen was irrigated.  The seprafilm was placed.  The fascia was reapproximated with 0-0 looped PDS suture.  The skin was left open.  It was packed with betadine-soaked Kerlix.  The ostomy was created with 4-0 vicryl.  The two ends were sutured together.  This was dressed with an ostomy appliance.    The patient was in improved condition and taken back to the ICU intubated.    Needle, sponge, and instrument count was correct times 2.

## 2013-01-03 NOTE — Preoperative (Signed)
Beta Blockers   Reason not to administer Beta Blockers:Not Applicable 

## 2013-01-03 NOTE — Anesthesia Postprocedure Evaluation (Signed)
  Anesthesia Post-op Note  Patient: Renee Nicholson  Procedure(s) Performed: Procedure(s): EXPLORATORY LAPAROTOMY WITH LYSIS OF ADHESIONS, revision of ileostomy (N/A)  Patient Location: SICU  Anesthesia Type:General  Level of Consciousness: sedated and Patient remains intubated per anesthesia plan  Airway and Oxygen Therapy: Patient remains intubated per anesthesia plan and Patient placed on Ventilator (see vital sign flow sheet for setting)  Post-op Pain: none  Post-op Assessment: Post-op Vital signs reviewed, Patient's Cardiovascular Status Stable, Respiratory Function Stable, Patent Airway, No signs of Nausea or vomiting and Pain level controlled  Post-op Vital Signs: Reviewed and stable  Complications: No apparent anesthesia complications

## 2013-01-03 NOTE — Transfer of Care (Signed)
Immediate Anesthesia Transfer of Care Note  Patient: Renee Nicholson  Procedure(s) Performed: Procedure(s): EXPLORATORY LAPAROTOMY WITH LYSIS OF ADHESIONS, revision of ileostomy (N/A)  Patient Location: ICU  Anesthesia Type:General  Level of Consciousness: sedated, unresponsive and Patient remains intubated per anesthesia plan  Airway & Oxygen Therapy: Patient remains intubated per anesthesia plan and Patient placed on Ventilator (see vital sign flow sheet for setting)  Post-op Assessment: Report given to PACU RN and Post -op Vital signs reviewed and stable  Post vital signs: Reviewed and stable  Complications: No apparent anesthesia complications

## 2013-01-03 NOTE — OR Nursing (Signed)
Patient arrived to operating room with small red bracelet on right wrist. Right wrist appeared swollen and upon further inspection by Lafe Garin, RN the bracelet snapped off. It was placed in a labeled specimen container and specimen bag and attached the patient's chart.  Oralia Manis, RN

## 2013-01-03 NOTE — Progress Notes (Signed)
PARENTERAL NUTRITION CONSULT NOTE - Follow Up  Pharmacy Consult for TPN Indication: SBO  No Known Allergies  Patient Measurements: Height: 4\' 5"  (134.6 cm) Weight: 98 lb 8.7 oz (44.7 kg) IBW/kg (Calculated) : 29.4 Usual Weight: 95 lb  Vital Signs: Temp: 100.3 F (37.9 C) (04/11 0411) Temp src: Oral (04/11 0411) BP: 99/69 mmHg (04/11 0600) Pulse Rate: 131 (04/11 0720) Intake/Output from previous day: 04/10 0701 - 04/11 0700 In: 7137.9 [I.V.:4069.7; Blood:905; IV Piggyback:1292; TPN:871.2] Out: 2435 [Urine:1635; Emesis/NG output:800] Intake/Output from this shift:    Labs:  Recent Labs  01/01/13 1530  01/02/13 0500 01/02/13 1130 01/02/13 1146 01/03/13 0455  WBC 2.0*  < > 3.3*  --  1.2* 1.4*  HGB 10.8*  < > 11.2*  --  8.3* 7.8*  HCT 30.3*  < > 30.9*  --  23.1* 21.3*  PLT 169  < > 163  --  100* 84*  APTT 34  --   --   --   --   --   INR 3.33*  --   --  1.54*  --  1.77*  < > = values in this interval not displayed.   Recent Labs  01/01/13 2200 01/02/13 0500 01/02/13 1130 01/03/13 0455  NA 142 142 143 144  K 3.0* 4.4 3.4* 2.8*  CL 118* 120* 115* 116*  CO2 13* 11* 15* 16*  GLUCOSE 143* 166* 298* 150*  BUN 15 16 15 13   CREATININE 0.79 0.75 0.70 0.64  CALCIUM 6.1* 6.4* 6.8* 7.8*  MG  --  1.3*  --  2.3  PHOS  --  2.2*  --  1.8*  PROT 4.0* 4.1* 5.4*  --   ALBUMIN 1.8* 1.7* 2.8*  --   AST 47* 68* 88*  --   ALT 23 34 56*  --   ALKPHOS 32* 33* 47  --   BILITOT 0.4 0.3 0.5  --   PREALBUMIN  --  10.6*  --   --   TRIG  --  169*  --   --   CHOL  --  66  --   --    Estimated Creatinine Clearance: 48.7 ml/min (by C-G formula based on Cr of 0.64).    Recent Labs  01/02/13 1523 01/02/13 2057 01/03/13 0002  GLUCAP 177* 93 136*    Medical History: Past Medical History  Diagnosis Date  . Anemia   . Diarrhea   . Anxiety   . Bright red rectal bleeding 09/13/2011  . History of chemotherapy     completed 09/2011   . Hx of radiation therapy 08/29/11 to  10/09/11    rectum  . GERD (gastroesophageal reflux disease)   . Ileostomy in place   . Cancer   . Rectal cancer     Medications:  Prescriptions prior to admission  Medication Sig Dispense Refill  . B Complex-C (B-COMPLEX WITH VITAMIN C) tablet Take 1 tablet by mouth daily.      . ferrous sulfate 325 (65 FE) MG tablet Take 325 mg by mouth daily with breakfast.      . lactose free nutrition (BOOST) LIQD Take 237 mLs by mouth 2 (two) times daily.      . Multiple Vitamin (MULTIVITAMIN) tablet Take 1 tablet by mouth every morning.       . pantoprazole (PROTONIX) 40 MG tablet Take 40 mg by mouth every morning.       . potassium chloride SA (K-DUR,KLOR-CON) 20 MEQ tablet Take 20 mEq by mouth  every morning.      . Simethicone 125 MG CAPS Take 1 capsule by mouth 4 (four) times daily as needed (for gas or upset stomach).         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 Complicated by stricture requiring dilatation. This is now the 4th episode of small bowel obstruction requiring admission(she was discharged on 4/4). Patient presents with 24 hr of minimal ostomy output, abdominal and back pain, nausea and emesis. Xray in ER shows findings consistent with SBO. Plan back to OR for exploratory surgery when more stable Lytes:  K 2.8 , mag 2.3, Phos 1.8, Ca 7.8    Cards:  Pt experienced tachycardia and hypotension requiring transfer to ICU. On levophed drip  BP 99/69  HR 131 Resp:  Intubated for resp insufficiency GI:  AST slightly elevated, NGT with 650 ml out     Nutritional Goals:  1246 kCal, 50-60 grams of protein per day Clinimix E 5/20 at 50 ml/hr will provide 60 gm protein daily and aver 1262 Kcal ( with lipids MWF)  Plan:  Cont Clinimix E 5/15 at 40 ml/hr. KCl 10 mEq IV X 8 and 20 mmol  glycophos per MD Cont sens ssi.  May dc if tolerates TPN Additional fluids as per MD Lipids, MVI and trace elements on MWF only due to national backorder. F/u am labs   Talbert Cage Poteet 01/03/2013,7:33 AM

## 2013-01-03 NOTE — Progress Notes (Signed)
[facility] ADULT ICU REPLACEMENT PROTOCOL FOR AM LAB REPLACEMENT ONLY  The patient does apply for the [facility] Adult ICU Electrolyte Replacment Protocol based on the criteria listed below:   1. Is GFR >/= 40 ml/min? yes  Patient's GFR today is 90 2. Is urine output >/= 0.5 ml/kg/hr for the last 6 hours? yes Patient's UOP is 1.19 ml/kg/hr 3. Is BUN < 60 mg/dL? yes  Patient's BUN today is 13 4. Abnormal electrolyte(s): K 2.8, Phos 1.8 5. Ordered repletion with: Phos 20 mmole, KCL 10 meq X 8 iv 6. If a panic level lab has been reported, has the CCM MD in charge been notified? yes.   Physician:  Dr Higinio Plan, Teven Mittman A 01/03/2013 5:55 AM

## 2013-01-03 NOTE — Progress Notes (Signed)
PULMONARY  / CRITICAL CARE MEDICINE  Name: Renee Nicholson MRN: [id number] DOB: [birth year]    ADMISSION DATE:  12/31/2012 CONSULTATION DATE:  01/01/2013  REFERRING MD :  Donell Beers PRIMARY SERVICE: Surgery  CHIEF COMPLAINT:  Shock  BRIEF PATIENT DESCRIPTION: 47 year old female with a h/o Rectal CA, GERD, recurrent SBO, total colectomy with ileo-anal anastomosis and diverting loop ileostomy. Presented 4/8 with a 24 hour history of abdomnial pain, N/V, and minimal ostomy output. She was admitted for SBO. 4/9 she became tachycardiac and hypotensive. PCCM has been asked to see.   SIGNIFICANT EVENTS / STUDIES:  4/8 - Abd XRay - Small Bowel Obstruction. No peritoneal air.  4/9 - CT abd/pelvis >>> ?LLL consolidation, SBO and adhesions, no free air 4/10 - ETT for tachypnea 4/11- febrile plan for OR lysis of adhesions   LINES / TUBES: CVL 4/9 >>> NGT 4/9 >>> RIJ 4/9 >>> ETT 4/10 >>>  CULTURES: BC x 2 4/9 >>> Urine 4/9 >>> Sputum 4/9 >>>  ANTIBIOTICS:  Meropenem 4/9 >>>  HISTORY OF PRESENT ILLNESS:  47 year old female with a h/o Rectal CA, GERD, and bowel obstruction. 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 She has had multiple small bowel obstructions requiring admission. Just discharged 4/4 after being treated medically for SBO.  4/7 she began having abdominal pain with markedly decreased ostomy output. Pain radiated to back. Also had nausea and vomiting. Abd XRay was consistent with SBO. She was admitted to inpatient at [facility]. 4/9 became hypotensive and tachycardic. She was transferred to ICU and PCCM has been asked to consult in this setting.    SUBJECTIVE: NG poor suction had some vomiting this AM  VITAL SIGNS: Temp:  [97.4 F (36.3 C)-100.9 F (38.3 C)] 100.3 F (37.9 C) (04/11 0411) Pulse Rate:  [94-153] 131 (04/11 0720) Resp:  [12-47] 33 (04/11 0720) BP: (83-138)/(57-90) 99/69 mmHg (04/11 0600) SpO2:  [91 %-100 %] 97 % (04/11  0720) FiO2 (%):  [39.4 %-100 %] 40 % (04/11 0720) Weight:  [98 lb 8.7 oz (44.7 kg)] 98 lb 8.7 oz (44.7 kg) (04/11 0149) HEMODYNAMICS: CVP:  [0 mmHg-3 mmHg] 3 mmHg VENTILATOR SETTINGS: Vent Mode:  [-] PRVC FiO2 (%):  [39.4 %-100 %] 40 % Set Rate:  [16 bmp-20 bmp] 20 bmp Vt Set:  [450 mL-500 mL] 500 mL PEEP:  [4.7 cmH20-5 cmH20] 5 cmH20 Plateau Pressure:  [25 cmH20-36 cmH20] 35 cmH20 INTAKE / OUTPUT: Intake/Output     04/10 0701 - 04/11 0700 04/11 0701 - 04/12 0700   I.V. (mL/kg) 4069.7 (91)    Blood 905    NG/GT     IV Piggyback 1292    TPN 871.2    Total Intake(mL/kg) 7137.9 (159.7)    Urine (mL/kg/hr) 1635 (1.5)    Emesis/NG output 800 (0.7)    Total Output 2435     Net +4702.9            PHYSICAL EXAMINATION: General:  Petite female, acutely ill in appearance. Neuro:  sedated HEENT: ETT, no JVD Cardiovascular:  tachy no m/r/g Lungs: upper respiratory noise.  Abdomen:  NG less secretions, distended abdomen, involuntary guarding, tense Musculoskeletal:  Intact Skin:  Intact  LABS:  Recent Labs Lab 12/31/12 1225 01/01/13 1530 01/01/13 1547 01/01/13 2200 01/02/13 0500 01/02/13 0600 01/02/13 1040 01/02/13 1130 01/02/13 1146 01/03/13 0455  HGB 13.2 10.8*  --  11.2* 11.2*  --   --   --  8.3* 7.8*  WBC 12.7* 2.0*  --  2.1* 3.3*  --   --   --  1.2* 1.4*  PLT 261 169  --  155 163  --   --   --  100* 84*  NA 134* 140  --  142 142  --   --  143  --  144  K 3.5 3.8  --  3.0* 4.4  --   --  3.4*  --  2.8*  CL 93* 114*  --  118* 120*  --   --  115*  --  116*  CO2 27 13*  --  13* 11*  --   --  15*  --  16*  GLUCOSE 114* 112*  --  143* 166*  --   --  298*  --  150*  BUN 15 23  --  15 16  --   --  15  --  13  CREATININE 0.58 1.01  --  0.79 0.75  --   --  0.70  --  0.64  CALCIUM 9.6 6.5*  --  6.1* 6.4*  --   --  6.8*  --  7.8*  MG  --   --   --   --  1.3*  --   --   --   --  2.3  PHOS  --   --   --   --  2.2*  --   --   --   --  1.8*  AST 19 22  --  47* 68*  --   --   88*  --   --   ALT 18 12  --  23 34  --   --  56*  --   --   ALKPHOS 91 33*  --  32* 33*  --   --  47  --   --   BILITOT 1.0 0.7  --  0.4 0.3  --   --  0.5  --   --   PROT 7.5 4.1*  --  4.0* 4.1*  --   --  5.4*  --   --   ALBUMIN 4.1 1.9*  --  1.8* 1.7*  --   --  2.8*  --   --   APTT  --  34  --   --   --   --   --   --   --   --   INR  --  3.33*  --   --   --   --   --  1.54*  --  1.77*  LATICACIDVEN  --  6.0*  --   --   --  3.8*  --  5.4*  --   --   TROPONINI  --  <0.30  --   --   --   --   --   --   --   --   PROCALCITON  --  61.35  --   --  76.89  --   --   --   --  46.27  PHART  --   --  7.347*  --   --   --  7.172*  --   --   --   PCO2ART  --   --  16.9*  --   --   --  35.5  --   --   --   PO2ART  --   --  73.0*  --   --   --  67.0*  --   --   --     Recent Labs Lab 01/02/13 0742 01/02/13 1211 01/02/13 1523 01/02/13 2057 01/03/13 0002  GLUCAP 133* 255* 177* 93 136*    4/9 CXR:  No acute cardiopulmonary process.   4/10 pcxr: increased bibasilar atelectasis  ASSESSMENT / PLAN:  PULMONARY A: Pt was tachypneic in setting of SIRS, CXR clear intubated 4/10.  P:   -vent support, no plans extubation until after surgery  -pcxr in AM  CARDIOVASCULAR A: Shock hypovolemic + septic, conts on norepi  Sinus tachy P:  Fluid Resuscitation. Target MAP: > 65 mm/Hg target CVP:  > 8 mm/Hg (pt receiving albumin per SGY + NS bolus) abx as below   RENAL A:  Hypokalemic in setting of large NG output, hypomagnesemia and hypophosphatemia, good UOP P:   Repeat bmet Cont to replace and monitor  GASTROINTESTINAL A:  Small Bowel Obstrucition w/ complicated surgical bowel history. NG output ~800 mL in 24hr. Hx of colon CA, many sgy, chemo, XRT P:   -NGT to intermittent suction -Surgery following -->will go for exploration on 4/11 -TPN per surgery  HEMATOLOGIC A:  Baseline microcytic anemia. Pancytopenia.  INR 1.5>>3.3>>1.7 (recievced 3U FFP 4/10 and VitK 4/9) P:  - blood and FFP  4/11 -Monitor/Supportive Care -hold lovenox.   INFECTIOUS A:  Sepsis, presumed source intra-abd. Leukopenia, Tmax 101.7. PCT 76.8>>46 4/11. Lactic acid downtrending 6.0>>3.8 4/10  P:   Cont meropenum (4/9) Cultures pending Trend WBC and fever curve may need antifungal coverage. Will reassess after surgery 4/11  ENDOCRINE A:  No Acute Issue   P:   -SSI  NEUROLOGIC A:  Pt baseline anxiety disorder and acute Encephalopathy - in setting of shock P:   Pt currently sedated Ativan and haldol prn  TODAY'S SUMMARY:  -surgery to explore 4/11 -cont fluid resuscitation  Christen Bame, MD PGY-1 Pgr: [phone]  CC time 30 minutes  Levy Pupa, MD, PhD 01/03/2013, 12:05 PM [facility] [phone] or if no answer [phone]

## 2013-01-03 NOTE — Anesthesia Preprocedure Evaluation (Signed)
Anesthesia Evaluation  Patient identified by MRN, date of birth, ID band Patient unresponsive    Reviewed: Allergy & Precautions, H&P , NPO status , Patient's Chart, lab work & pertinent test results, reviewed documented beta blocker date and time , Unable to perform ROS - Chart review only  Airway Mallampati: II TM Distance: >3 FB Neck ROM: full    Dental   Pulmonary neg pulmonary ROS,  breath sounds clear to auscultation    + intubated    Cardiovascular Rhythm:regular Rate:Tachycardia     Neuro/Psych negative psych ROS   GI/Hepatic Neg liver ROS, GERD-  Medicated and Controlled,  Endo/Other  negative endocrine ROS  Renal/GU negative Renal ROS  negative genitourinary   Musculoskeletal   Abdominal   Peds  Hematology  (+) anemia ,   Anesthesia Other Findings See surgeon's H&P   Reproductive/Obstetrics negative OB ROS                           Anesthesia Physical Anesthesia Plan  ASA: IV  Anesthesia Plan: General   Post-op Pain Management:    Induction: Intravenous  Airway Management Planned: Oral ETT  Additional Equipment: Arterial line  Intra-op Plan:   Post-operative Plan: Post-operative intubation/ventilation  Informed Consent: I have reviewed the patients History and Physical, chart, labs and discussed the procedure including the risks, benefits and alternatives for the proposed anesthesia with the patient or authorized representative who has indicated his/her understanding and acceptance.   Dental Advisory Given  Plan Discussed with: CRNA and Surgeon  Anesthesia Plan Comments:         Anesthesia Quick Evaluation

## 2013-01-03 NOTE — Anesthesia Procedure Notes (Signed)
Procedures

## 2013-01-03 NOTE — Progress Notes (Signed)
Patient ID: Renee Nicholson, female   DOB: 03/18/1966, 47 y.o.   MRN: [id number]    Subjective: Pt stable.  No acute events.    Objective: Vital signs in last 24 hours: Temp:  [98.3 F (36.8 C)-100.9 F (38.3 C)] 100.3 F (37.9 C) (04/11 0411) Pulse Rate:  [94-153] 131 (04/11 0720) Resp:  [12-39] 33 (04/11 0720) BP: (83-138)/(57-90) 99/69 mmHg (04/11 0600) SpO2:  [91 %-100 %] 97 % (04/11 0720) FiO2 (%):  [39.4 %-100 %] 40 % (04/11 0720) Weight:  [98 lb 8.7 oz (44.7 kg)] 98 lb 8.7 oz (44.7 kg) (04/11 0149) Last BM Date: 01/01/13  Intake/Output from previous day: 04/10 0701 - 04/11 0700 In: 7137.9 [I.V.:4069.7; Blood:905; IV Piggyback:1292; TPN:871.2] Out: 2435 [Urine:1635; Emesis/NG output:800] Intake/Output this shift:    General appearance: delirious and moderate distress Resp: tachypneic Cardio: tachycardic, regular GI: soft,  distended, gas in bag l  Lab Results:   Recent Labs  01/02/13 1146 01/03/13 0455  WBC 1.2* 1.4*  HGB 8.3* 7.8*  HCT 23.1* 21.3*  PLT 100* 84*   BMET  Recent Labs  01/02/13 1130 01/03/13 0455  NA 143 144  K 3.4* 2.8*  CL 115* 116*  CO2 15* 16*  GLUCOSE 298* 150*  BUN 15 13  CREATININE 0.70 0.64  CALCIUM 6.8* 7.8*   PT/INR  Recent Labs  01/02/13 1130 01/03/13 0455  LABPROT 18.0* 20.0*  INR 1.54* 1.77*   ABG  Recent Labs  01/01/13 1547 01/02/13 1040  PHART 7.347* 7.172*  HCO3 9.3* 13.0*    Studies/Results: Ct Abdomen Pelvis W Contrast  01/01/2013  *RADIOLOGY REPORT*  Clinical Data: 47 year old female with a history of rectal cancer status post colectomy with ileoanal anastomosis and diverting loop ileostomy.  She has a history of recurrent small bowel obstruction and now has progressive obstructive symptoms.  CT ABDOMEN AND PELVIS WITH CONTRAST  Technique:  Multidetector CT imaging of the abdomen and pelvis was performed following the standard protocol during bolus administration of intravenous contrast.  Contrast: 80mL  OMNIPAQUE IOHEXOL 300 MG/ML  SOLN  Comparison: Most recent prior acute abdominal series 12/31/2012; most recent CT abdomen/pelvis 09/05/2012  Findings:  Lower Chest:  Patchy consolidation in the left lower lobe.  The distal esophagus is patulous and contains ingested oral contrast material.  The visualized heart is within normal limits for size. No pericardial effusion.  Abdomen: A nasogastric tube terminates in the gastric antrum. Diffuse marked distension of fluid-filled small bowel throughout the abdomen.  Small bowel is dilated up to 3.8 cm in diameter. There appears to be a focal transition point in the right lower quadrants just proximal to the diverting ileostomy.  This is best seen on the coronal view where there is a focal swirling of the mesenteric vessels.  The final few centimeters of the ilium extending to the abdominal wall are decompressed.  Small bowel mucosa enhances throughout.  No evidence of ischemia or closed loop obstruction.  There is a small amount of ascites and mesenteric edema throughout the abdomen.  No free air.  Unremarkable CT appearance of the spleen, pancreas, and liver. Gallbladder is unremarkable. No intra or extrahepatic biliary ductal dilatation.  Unremarkable kidneys and adrenal glands. Extensive postsurgical changes of prior total colectomy with ileoanal anastomosis with residual presacral fluid versus patulousness at the ileoanal anastomosis.  Pelvis: Foley catheter within the bladder.  The intraluminal bladder air is present related to the recent instrumentation.  Bones: No acute fracture or aggressive appearing lytic or blastic  osseous lesion.  Vascular: No significant atherosclerotic vascular disease.  IMPRESSION:  1.  Small bowel obstruction with focal transition point in the right lower quadrant subjacent to the diverting ileostomy.  The transition point is best seen on the coronal images (image 28 of 77, series 400) where there is focal swirling of the mesenteric  vessels.  The final few centimeters of the distal ileum is decompressed as it extends through the anterior abdominal wall. Findings suggest underlying adhesive disease.  2. Patchy consolidation in the left lower lobe concerning for pneumonia versus aspiration.  3.  Surgical changes of prior total colectomy with ileoanal anastomosis and right lower quadrant diverting ileostomy.  4.  Similar appearance of presacral fluid versus patulousness of the ileoanal anastomosis.  5.  Nasogastric tube in place, the tip is in the gastric antrum.   Original Report Authenticated By: Malachy Moan, M.D.    Dg Chest Portable 1 View  01/02/2013  *RADIOLOGY REPORT*  Clinical Data: New endotracheal tube.  Assess position.  PORTABLE CHEST - 1 VIEW  Comparison: Chest x-ray 01/01/2013.  Findings: An endotracheal tube is in place with tip 1.1 cm above the carina. There is a mid superior vena cava-sided internal jugular central venous catheter with tip terminating in the left- sided subclavian single lumen power Port-A-Cath with tip terminating in the distal superior vena cava.  Nasogastric tube is coiled in the stomach.  Lung volumes are low.  Increasing bibasilar opacities favored to reflect subsegmental atelectasis, potentially with small bilateral pleural effusions.  No evidence of pulmonary edema.  Heart size is within normal limits. The patient is rotated to the right on today's exam, resulting in distortion of the mediastinal contours and reduced diagnostic sensitivity and specificity for mediastinal pathology. .  IMPRESSION: 1.  Support apparatus, as above.  The endotracheal tube is slightly low lying, and could be withdrawn approximately 3 cm for more optimal placement. 2.  Increasing bibasilar opacities favored to reflect small pleural effusions superimposed upon underlying subsegmental atelectasis.   Original Report Authenticated By: Trudie Reed, M.D.    Dg Chest Port 1 View  01/01/2013  *RADIOLOGY REPORT*  Clinical  Data: Evaluate right IJ line placement  PORTABLE CHEST - 1 VIEW  Comparison: 12/31/2012  Findings: Grossly unchanged cardiac silhouette and mediastinal contours.  Interval placement of a right jugular approach central venous catheter with tip overlying the superior cavoatrial junction.  Interval placement of enteric tube with tip inside port projected the left hemidiaphragm.  Stable positioning of left anterior chest wall subclavian vein approach port catheter.  No pneumothorax.  Lung volumes are slightly reduced with corresponding interval increase in bibasilar opacities favored to represent atelectasis. No definite pleural effusion.  Unchanged bones.  IMPRESSION: 1.  Right jugular approach central venous catheter tip overlies the superior cavoatrial junction.  No pneumothorax. 2.  Enteric tube tip and side port project below the left hemidiaphragm. 3.  Slightly decreased lung volumes with increased bibasilar atelectasis.   Original Report Authenticated By: Tacey Ruiz, MD     Anti-infectives: Anti-infectives   Start     Dose/Rate Route Frequency Ordered Stop   01/02/13 0100  meropenem (MERREM) 1 g in sodium chloride 0.9 % 100 mL IVPB     1 g 200 mL/hr over 30 Minutes Intravenous 2 times daily 01/01/13 2354     01/01/13 1600  meropenem (MERREM) 1 g in sodium chloride 0.9 % 100 mL IVPB     1 g 200 mL/hr over 30 Minutes Intravenous STAT  01/01/13 1535 01/01/13 1640      Assessment/Plan: s/p Procedure(s): EXPLORATORY LAPAROTOMY WITH LYSIS OF ADHESIONS (N/A) Probable intubation by CCM Albumin for hypoalbuminemia Plan ex lap today to fix obstruction.  Plan on leaving ostomy. Will need FFP for hypocoagulability and pRBCs for anemia.     LOS: 3 days    [facility] 01/03/2013

## 2013-01-04 ENCOUNTER — Inpatient Hospital Stay (HOSPITAL_COMMUNITY): Payer: BC Managed Care – PPO

## 2013-01-04 LAB — BASIC METABOLIC PANEL
BUN: 17 mg/dL (ref 6–23)
CO2: 20 mEq/L (ref 19–32)
Calcium: 7.8 mg/dL — ABNORMAL LOW (ref 8.4–10.5)
Creatinine, Ser: 0.55 mg/dL (ref 0.50–1.10)
Glucose, Bld: 87 mg/dL (ref 70–99)

## 2013-01-04 LAB — GLUCOSE, CAPILLARY
Glucose-Capillary: 73 mg/dL (ref 70–99)
Glucose-Capillary: 117 mg/dL — ABNORMAL HIGH (ref 70–99)
Glucose-Capillary: 107 mg/dL — ABNORMAL HIGH (ref 70–99)
Glucose-Capillary: 176 mg/dL — ABNORMAL HIGH (ref 70–99)

## 2013-01-04 LAB — TYPE AND SCREEN
ABO/RH(D): B POS
Antibody Screen: NEGATIVE
Unit division: 0
Unit division: 0

## 2013-01-04 LAB — PREPARE FRESH FROZEN PLASMA
Unit division: 0
Unit division: 0

## 2013-01-04 MED ORDER — CLINIMIX E/DEXTROSE (5/15) 5 % IV SOLN
INTRAVENOUS | Status: AC
  Administered 2013-01-04: 18:00:00 via INTRAVENOUS
  Filled 2013-01-04: qty 1000

## 2013-01-04 MED ORDER — NOREPINEPHRINE BITARTRATE 1 MG/ML IJ SOLN
2.0000 ug/min | INTRAVENOUS | Status: DC
  Administered 2013-01-04: 10 ug/min via INTRAVENOUS
  Filled 2013-01-04 (×4): qty 16

## 2013-01-04 MED ORDER — DEXTROSE 50 % IV SOLN
INTRAVENOUS | Status: AC
  Administered 2013-01-04: 25 mL
  Filled 2013-01-04: qty 50

## 2013-01-04 MED ORDER — CHLORHEXIDINE GLUCONATE 0.12 % MT SOLN
15.0000 mL | Freq: Two times a day (BID) | OROMUCOSAL | Status: DC
  Administered 2013-01-04 – 2013-01-17 (×24): 15 mL via OROMUCOSAL
  Filled 2013-01-04 (×29): qty 15

## 2013-01-04 MED ORDER — POTASSIUM PHOSPHATE DIBASIC 3 MMOLE/ML IV SOLN
30.0000 mmol | Freq: Once | INTRAVENOUS | Status: AC
  Administered 2013-01-04: 30 mmol via INTRAVENOUS
  Filled 2013-01-04: qty 10

## 2013-01-04 NOTE — Progress Notes (Signed)
Withdrew ETT 2cm per MD order. Pt tolerated well. No complications. Rt will continue to monitor.

## 2013-01-04 NOTE — Progress Notes (Signed)
CRITICAL VALUE ALERT  Critical value received: cbg 63  Date of notification:  01/04/2013 Time of notification: 0800  Critical value read back: not applicable  Nurse who received alert:  Unit test . Pt given d50 1/2 amp blood sugar 117 after 15 minutes  MD notified (1st page):  Not applicable  Time of first page: protocol  MD notified (2nd page):  Time of second page:  Responding MD:  protocol  Time MD responded:  protocol

## 2013-01-04 NOTE — Progress Notes (Signed)
ANTIBIOTIC CONSULT NOTE-PROGRESS NOTE  Pharmacy Consult for Meropenem Indication: Sepsis, likely abdominal source  Hospital Problems Active Problems:   SBO (small bowel obstruction)   Shock   SIRS (systemic inflammatory response syndrome)   Acute respiratory failure  Vitals: BP 103/70  Pulse 119  Temp(Src) 98.7 F (37.1 C) (Oral)  Resp 25  Ht 4\' 5"  (1.346 m)  Wt 98 lb 8.7 oz (44.7 kg)  BMI 24.67 kg/m2  SpO2 99%  LMP 02/20/2012  Labs:  Recent Labs  01/03/13 0455 01/03/13 1419 01/03/13 2000 01/04/13 0645  WBC 1.4*  --  2.3*  --   HGB 7.8* 10.2* 12.0  --   PLT 84*  --  61*  --   CREATININE 0.64  --  0.52 0.55   Estimated Creatinine Clearance: 48.7 ml/min (by C-G formula based on Cr of 0.55).   Microbiology: Recent Results (from the past 720 hour(s))  MRSA PCR SCREENING     Status: None   Collection Time    01/01/13  2:12 PM      Result Value Range Status   MRSA by PCR NEGATIVE  NEGATIVE Final  CULTURE, BLOOD (ROUTINE X 2)     Status: None   Collection Time    01/01/13  3:04 PM      Result Value Range Status   Specimen Description BLOOD CENTRAL LINE   Final   Special Requests BOTTLES DRAWN AEROBIC AND ANAEROBIC 10CC   Final   Culture  Setup Time 01/01/2013 21:21   Final   Culture     Final   Value:        BLOOD CULTURE RECEIVED NO GROWTH TO DATE CULTURE WILL BE HELD FOR 5 DAYS BEFORE ISSUING A FINAL NEGATIVE REPORT   Report Status PENDING   Incomplete  CULTURE, BLOOD (ROUTINE X 2)     Status: None   Collection Time    01/01/13  3:09 PM      Result Value Range Status   Specimen Description BLOOD CENTRAL LINE   Final   Special Requests BOTTLES DRAWN AEROBIC AND ANAEROBIC 10CC   Final   Culture  Setup Time 01/01/2013 21:19   Final   Culture     Final   Value:        BLOOD CULTURE RECEIVED NO GROWTH TO DATE CULTURE WILL BE HELD FOR 5 DAYS BEFORE ISSUING A FINAL NEGATIVE REPORT   Report Status PENDING   Incomplete    Anti-infectives Anti-infectives   Start      Dose/Rate Route Frequency Ordered Stop   01/02/13 0100  meropenem (MERREM) 1 g in sodium chloride 0.9 % 100 mL IVPB     1 g 200 mL/hr over 30 Minutes Intravenous 2 times daily 01/01/13 2354     01/01/13 1600  meropenem (MERREM) 1 g in sodium chloride 0.9 % 100 mL IVPB     1 g 200 mL/hr over 30 Minutes Intravenous STAT 01/01/13 1535 01/01/13 1640     Assessment:  Day # 4 Meropenem therapy for Sepsis, likely abdominal source. Afebrile. WBC 1.4 > 2.3.  Last Hgb up, Platelets down. Est CrCl ~ 49 ml/min  Goal of Therapy:   Renal adjusted Meropenem dosing.  Resolution of Sepsis.  Plan:   Continue Meropenem at present dose 1 gm IV q 12 hours. Follow up SCr, UOP, cultures, clinical course and adjust as clinically indicated.   Laurena Bering, Pharm.D.  01/04/2013 12:00 PM

## 2013-01-04 NOTE — Progress Notes (Signed)
1 Day Post-Op  Subjective: Intubated, sedated, and critically ill  Objective: Vital signs in last 24 hours: Temp:  [97.7 F (36.5 C)-101.7 F (38.7 C)] 98.7 F (37.1 C) (04/12 0747) Pulse Rate:  [101-130] 128 (04/12 0727) Resp:  [19-30] 27 (04/12 0727) BP: (81-119)/(60-84) 110/65 mmHg (04/12 0727) SpO2:  [89 %-99 %] 98 % (04/12 0727) Arterial Line BP: (97-118)/(59-74) 112/68 mmHg (04/12 0700) FiO2 (%):  [40 %-99.7 %] 60 % (04/12 0727) Last BM Date: 01/01/13  Intake/Output from previous day: 04/11 0701 - 04/12 0700 In: 6468.7 [I.V.:3134.7; WJXBJ:478; IV Piggyback:1504; TPN:936] Out: 2070 [Urine:975; Emesis/NG output:850; Stool:195; Blood:50] Intake/Output this shift:    Resp: clear to auscultation bilaterally GI: soft, dressing clean, ostomy pink  Lab Results:   Recent Labs  01/03/13 0455 01/03/13 1419 01/03/13 2000  WBC 1.4*  --  2.3*  HGB 7.8* 10.2* 12.0  HCT 21.3* 30.0* 32.6*  PLT 84*  --  61*   BMET  Recent Labs  01/03/13 2000 01/04/13 0645  NA 151* 147*  K 3.2* 4.0  CL 122* 120*  CO2 18* 20  GLUCOSE 172* 87  BUN 15 17  CREATININE 0.52 0.55  CALCIUM 7.0* 7.8*   PT/INR  Recent Labs  01/03/13 0455 01/03/13 2000  LABPROT 20.0* 17.9*  INR 1.77* 1.52*   ABG  Recent Labs  01/03/13 1419 01/03/13 1806  PHART 7.189* 7.333*  HCO3 16.8* 17.2*    Studies/Results: Dg Chest Portable 1 View  01/02/2013  *RADIOLOGY REPORT*  Clinical Data: New endotracheal tube.  Assess position.  PORTABLE CHEST - 1 VIEW  Comparison: Chest x-ray 01/01/2013.  Findings: An endotracheal tube is in place with tip 1.1 cm above the carina. There is a mid superior vena cava-sided internal jugular central venous catheter with tip terminating in the left- sided subclavian single lumen power Port-A-Cath with tip terminating in the distal superior vena cava.  Nasogastric tube is coiled in the stomach.  Lung volumes are low.  Increasing bibasilar opacities favored to reflect  subsegmental atelectasis, potentially with small bilateral pleural effusions.  No evidence of pulmonary edema.  Heart size is within normal limits. The patient is rotated to the right on today's exam, resulting in distortion of the mediastinal contours and reduced diagnostic sensitivity and specificity for mediastinal pathology. .  IMPRESSION: 1.  Support apparatus, as above.  The endotracheal tube is slightly low lying, and could be withdrawn approximately 3 cm for more optimal placement. 2.  Increasing bibasilar opacities favored to reflect small pleural effusions superimposed upon underlying subsegmental atelectasis.   Original Report Authenticated By: Trudie Reed, M.D.     Anti-infectives: Anti-infectives   Start     Dose/Rate Route Frequency Ordered Stop   01/02/13 0100  meropenem (MERREM) 1 g in sodium chloride 0.9 % 100 mL IVPB     1 g 200 mL/hr over 30 Minutes Intravenous 2 times daily 01/01/13 2354     01/01/13 1600  meropenem (MERREM) 1 g in sodium chloride 0.9 % 100 mL IVPB     1 g 200 mL/hr over 30 Minutes Intravenous STAT 01/01/13 1535 01/01/13 1640      Assessment/Plan: s/p Procedure(s): EXPLORATORY LAPAROTOMY WITH LYSIS OF ADHESIONS, revision of ileostomy (N/A) Continue abx Critical care per CCM Continue TPN and bowel rest  LOS: 4 days    TOTH III,PAUL S 01/04/2013

## 2013-01-04 NOTE — Progress Notes (Signed)
PARENTERAL NUTRITION CONSULT NOTE - Follow Up  Pharmacy Consult for TPN Indication: SBO  No Known Allergies  Patient Measurements: Height: 4\' 5"  (134.6 cm) Weight: 98 lb 8.7 oz (44.7 kg) IBW/kg (Calculated) : 29.4 Usual Weight: 95 lb  Vital Signs: Temp: 98.7 F (37.1 C) (04/12 0747) Temp src: Oral (04/12 0747) BP: 110/65 mmHg (04/12 0727) Pulse Rate: 128 (04/12 0727) Intake/Output from previous day: 04/11 0701 - 04/12 0700 In: 6468.7 [I.V.:3134.7; NWGNF:621; IV Piggyback:1504; TPN:936] Out: 2070 [Urine:975; Emesis/NG output:850; Stool:195; Blood:50] Intake/Output from this shift:    Labs:  Recent Labs  01/01/13 1530  01/02/13 1130 01/02/13 1146 01/03/13 0455 01/03/13 1419 01/03/13 2000  WBC 2.0*  < >  --  1.2* 1.4*  --  2.3*  HGB 10.8*  < >  --  8.3* 7.8* 10.2* 12.0  HCT 30.3*  < >  --  23.1* 21.3* 30.0* 32.6*  PLT 169  < >  --  100* 84*  --  61*  APTT 34  --   --   --   --   --   --   INR 3.33*  --  1.54*  --  1.77*  --  1.52*  < > = values in this interval not displayed.   Recent Labs  01/01/13 2200 01/02/13 0500 01/02/13 1130 01/03/13 0455 01/03/13 1419 01/03/13 2000 01/04/13 0645  NA 142 142 143 144 150* 151* 147*  K 3.0* 4.4 3.4* 2.8* 4.2 3.2* 4.0  CL 118* 120* 115* 116*  --  122* 120*  CO2 13* 11* 15* 16*  --  18* 20  GLUCOSE 143* 166* 298* 150*  --  172* 87  BUN 15 16 15 13   --  15 17  CREATININE 0.79 0.75 0.70 0.64  --  0.52 0.55  CALCIUM 6.1* 6.4* 6.8* 7.8*  --  7.0* 7.8*  MG  --  1.3*  --  2.3  --   --   --   PHOS  --  2.2*  --  1.8*  --   --  1.5*  PROT 4.0* 4.1* 5.4*  --   --   --   --   ALBUMIN 1.8* 1.7* 2.8*  --   --   --   --   AST 47* 68* 88*  --   --   --   --   ALT 23 34 56*  --   --   --   --   ALKPHOS 32* 33* 47  --   --   --   --   BILITOT 0.4 0.3 0.5  --   --   --   --   PREALBUMIN  --  10.6*  --   --   --   --   --   TRIG  --  169*  --   --   --   --   --   CHOL  --  66  --   --   --   --   --    Estimated Creatinine  Clearance: 48.7 ml/min (by C-G formula based on Cr of 0.55).    Recent Labs  01/03/13 2344 01/04/13 0454 01/04/13 0712  GLUCAP 137* 73 63*    Medical History: Past Medical History  Diagnosis Date  . Anemia   . Diarrhea   . Anxiety   . Bright red rectal bleeding 09/13/2011  . History of chemotherapy     completed 09/2011   . Hx of radiation  therapy 08/29/11 to 10/09/11    rectum  . GERD (gastroesophageal reflux disease)   . Ileostomy in place   . Cancer   . Rectal cancer     Medications:  Prescriptions prior to admission  Medication Sig Dispense Refill  . B Complex-C (B-COMPLEX WITH VITAMIN C) tablet Take 1 tablet by mouth daily.      . ferrous sulfate 325 (65 FE) MG tablet Take 325 mg by mouth daily with breakfast.      . lactose free nutrition (BOOST) LIQD Take 237 mLs by mouth 2 (two) times daily.      . Multiple Vitamin (MULTIVITAMIN) tablet Take 1 tablet by mouth every morning.       . pantoprazole (PROTONIX) 40 MG tablet Take 40 mg by mouth every morning.       . potassium chloride SA (K-DUR,KLOR-CON) 20 MEQ tablet Take 20 mEq by mouth every morning.      . Simethicone 125 MG CAPS Take 1 capsule by mouth 4 (four) times daily as needed (for gas or upset stomach).         Insulin Requirements in the past 24 hours:  5 unit  Current Nutrition:  Clinimix E 5/15 infusing at 40 ml/hr  Assessment:  47 yo lady with history of total colectomy with ileo-anal anastomosis and diverting loop ileostomy in March 2013. Complicated by stricture requiring dilatation. This is now the 4th episode of small bowel obstruction requiring admission(she was discharged on 4/4). Patient presents with 24 hr of minimal ostomy output, abdominal and back pain, nausea and emesis. Xray in ER shows findings consistent with SBO. Plan back to OR for exploratory surgery when more stable,  Unable to advance TPN to goal due to refeeding syndrome. Lytes:  K 4.0 , Phos 1.5, Ca 7.8    Cards:  Pt experienced  tachycardia and hypotension requiring transfer to ICU. On levophed drip  BP 110/65 HR 128 Resp:  Intubated for resp insufficiency GI:  AST slightly elevated, NGT with  850 ml out     Nutritional Goals:  1246 kCal, 50-60 grams of protein per day Clinimix E 5/20 at 50 ml/hr will provide 60 gm protein daily and aver 1262 Kcal ( with lipids MWF)  Plan:  Cont Clinimix E 5/15 at 40 ml/hr. Will give KPhos 30 mmol IV X 1 today Cont sens ssi.  May dc if tolerates TPN at goal rate Additional fluids as per MD Lipids, MVI and trace elements on MWF only due to national backorder. F/u am labs   Talbert Cage P[place]et 01/04/2013,7:54 AM

## 2013-01-04 NOTE — Progress Notes (Signed)
Tidal volumes decreased to 400 from 500 due to increased plateau pressures. Plat pressures now less than 30cm. Peep added due to decrease in Sp02 levels

## 2013-01-04 NOTE — Progress Notes (Signed)
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 Presented 4/8 with a 24 hour history of abdomnial pain, N/V, and minimal ostomy output. She was admitted for SBO. 4/9 she became tachycardiac and hypotensive. PCCM has been asked to see 4/9  SIGNIFICANT EVENTS / STUDIES:  4/8 - Abd XRay - Small Bowel Obstruction. No peritoneal air.  4/9 - CT abd/pelvis >>> ?LLL consolidation, SBO and adhesions, no free air 4/10 - ETT for tachypnea 4/11- febrile> plan to OR lysis of adhesions    LINES / TUBES: CVL 4/9 >>> NGT 4/9 >>> RIJ 4/9 >>> ETT 4/10 >>>  CULTURES: BC x 2 4/9 >>> Urine 4/9 >>> Sputum 4/9 >>>  ANTIBIOTICS:  Meropenem 4/9 >>>  HISTORY OF PRESENT ILLNESS:  47 year old female with a h/o Rectal CA, GERD, and bowel obstruction. 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 She has had multiple small bowel obstructions requiring admission. Just discharged 4/4 after being treated medically for SBO.  4/7 she began having abdominal pain with markedly decreased ostomy output. Pain radiated to back. Also had nausea and vomiting. Abd XRay was consistent with SBO. She was admitted to inpatient at [facility]. 4/9 became hypotensive and tachycardic. She was transferred to ICU and PCCM has been asked to consult in this setting.    SUBJECTIVE:  To OR 4/11 for lysis of adhesions Sedated on vent  Off pressors briefly this am, restarted for low b/p    VITAL SIGNS: Temp:  [97.7 F (36.5 C)-100.1 F (37.8 C)] 98.7 F (37.1 C) (04/12 0747) Pulse Rate:  [101-128] 128 (04/12 0727) Resp:  [19-30] 27 (04/12 0727) BP:  (81-119)/(62-84) 110/65 mmHg (04/12 0727) SpO2:  [89 %-99 %] 98 % (04/12 0727) Arterial Line BP: (97-118)/(59-74) 112/68 mmHg (04/12 0700) FiO2 (%):  [40 %-99.7 %] 60 % (04/12 0727) HEMODYNAMICS:   VENTILATOR SETTINGS: Vent Mode:  [-] PRVC FiO2 (%):  [40 %-99.7 %] 60 % Set Rate:  [20 bmp] 20 bmp Vt Set:  [500 mL] 500 mL PEEP:  [5 cmH20] 5 cmH20 Plateau Pressure:  [30 cmH20-38 cmH20] 34 cmH20 INTAKE / OUTPUT: Intake/Output     04/11 0701 - 04/12 0700 04/12 0701 - 04/13 0700   I.V. (mL/kg) 3134.7 (70.1)    Blood 894    IV Piggyback 1504    TPN 936    Total Intake(mL/kg) 6468.7 (144.7)    Urine (mL/kg/hr) 975 (0.9)    Emesis/NG output 850 (0.8)    Stool 195 (0.2)    Blood 50 (0)    Total Output 2070     Net +4398.7            PHYSICAL EXAMINATION: General:  Petite female, acutely ill in appearance. Neuro:  sedated HEENT: ETT, no JVD Cardiovascular:  tachy no m/r/g Lungs: upper respiratory noise.  Abdomen:  NGT, colostomy , surgical dsg intact and clean  Musculoskeletal:  Intact Skin:  Intact  LABS:  Recent Labs Lab 12/31/12 1225  01/01/13 1530  01/01/13 2200 01/02/13 0500 01/02/13 0600 01/02/13 1040 01/02/13 1130 01/02/13 1146 01/03/13 0455 01/03/13 1419 01/03/13 1806 01/03/13 2000 01/04/13 0645  HGB 13.2  --  10.8*  --  11.2* 11.2*  --   --   --  8.3* 7.8* 10.2*  --  12.0  --   WBC 12.7*  --  2.0*  --  2.1* 3.3*  --   --   --  1.2* 1.4*  --   --  2.3*  --   PLT 261  --  169  --  155 163  --   --   --  100* 84*  --   --  61*  --   NA 134*  --  140  --  142 142  --   --  143  --  144 150*  --  151* 147*  K 3.5  --  3.8  --  3.0* 4.4  --   --  3.4*  --  2.8* 4.2  --  3.2* 4.0  CL 93*  --  114*  --  118* 120*  --   --  115*  --  116*  --   --  122* 120*  CO2 27  --  13*  --  13* 11*  --   --  15*  --  16*  --   --  18* 20  GLUCOSE 114*  --  112*  --  143* 166*  --   --  298*  --  150*  --   --  172* 87  BUN 15  --  23  --  15 16  --   --  15  --  13  --   --   15 17  CREATININE 0.58  --  1.01  --  0.79 0.75  --   --  0.70  --  0.64  --   --  0.52 0.55  CALCIUM 9.6  --  6.5*  --  6.1* 6.4*  --   --  6.8*  --  7.8*  --   --  7.0* 7.8*  MG  --   --   --   --   --  1.3*  --   --   --   --  2.3  --   --   --   --   PHOS  --   --   --   --   --  2.2*  --   --   --   --  1.8*  --   --   --  1.5*  AST 19  --  22  --  47* 68*  --   --  88*  --   --   --   --   --   --   ALT 18  --  12  --  23 34  --   --  56*  --   --   --   --   --   --   ALKPHOS 91  --  33*  --  32* 33*  --   --  47  --   --   --   --   --   --   BILITOT 1.0  --  0.7  --  0.4 0.3  --   --  0.5  --   --   --   --   --   --   PROT 7.5  --  4.1*  --  4.0* 4.1*  --   --  5.4*  --   --   --   --   --   --  ALBUMIN 4.1  --  1.9*  --  1.8* 1.7*  --   --  2.8*  --   --   --   --   --   --   APTT  --   --  34  --   --   --   --   --   --   --   --   --   --   --   --   INR  --   < > 3.33*  --   --   --   --   --  1.54*  --  1.77*  --   --  1.52*  --   LATICACIDVEN  --   --  6.0*  --   --   --  3.8*  --  5.4*  --   --   --   --   --   --   TROPONINI  --   --  <0.30  --   --   --   --   --   --   --   --   --   --   --   --   PROCALCITON  --   --  61.35  --   --  76.89  --   --   --   --  46.27  --   --   --   --   PHART  --   --   --   < >  --   --   --  7.172*  --   --   --  7.189* 7.333*  --   --   PCO2ART  --   --   --   < >  --   --   --  35.5  --   --   --  43.8 32.4*  --   --   PO2ART  --   --   --   < >  --   --   --  67.0*  --   --   --  62.0* 59.0*  --   --   < > = values in this interval not displayed.  Recent Labs Lab 01/03/13 2027 01/03/13 2344 01/04/13 0454 01/04/13 0712 01/04/13 0824  GLUCAP 145* 137* 73 63* 117*    Imaging: Dg Chest Port 1 View  01/04/2013  *RADIOLOGY REPORT*  Clinical Data: Evaluate endotracheal tube.  PORTABLE CHEST - 1 VIEW  Comparison: 01/02/2013  Findings: Endotracheal tube remains near the carina. Endotracheal tube is roughly 9 mm above the carina.   Nasogastric tube is coiled in the gastric fundus.  Port-A-Cath tip in the lower SVC region. Right jugular central venous catheter in the SVC region.  There are increased patchy densities in both lungs.  No evidence for a pneumothorax.  Heart size is within normal limits.  IMPRESSION: Increased interstitial and airspace densities in both lungs. Findings could represent pulmonary edema or infection.  Support apparatuses as described.  The tip of the endotracheal tube is close to the carina. The endotracheal tube position was discussed with the patient's nursing staff on 01/04/2013 at 10:03 a.m.   Original Report Authenticated By: Richarda Overlie, M.D.      ASSESSMENT / PLAN:  PULMONARY A: Resp Failure in setting of SIRS, and probable aspiration pneumonia P:   -vent support, wean as tolerated   -pcxr in am  -check abg in am   CARDIOVASCULAR A: Shock hypovolemic +  septic, conts on norepi  Sinus tachy P:  Titrate pressors for  MAP: > 65 mm/Hg target CVP:  > 8 mm/Hg   abx as below Check cvp q4    RENAL A:  Hypokalemic in setting of large NG output, hypomagnesemia and hypophosphatemia, good UOP P:   Repeat bmet Cont to replace and monitor  GASTROINTESTINAL A:  Small Bowel Obstrucition w/ complicated surgical bowel history. NG output ~800 mL in 24hr. Hx of colon CA, many sgy, chemo, XRT S/p exploration on 4/11 w/ lysis of adhesions  P:   -NGT to intermittent suction -Surgery following  -TPN per surgery -PPI   HEMATOLOGIC A:  Baseline microcytic anemia. Pancytopenia.  Thrombocytopenia  INR 1.5>>3.3>>1.7 (recievced 3U FFP 4/10 and VitK 4/9) Blood /FFP 4/11  P:  -Monitor/Supportive Care -hold lovenox -SCD   INFECTIOUS A:  Sepsis, presumed source intra-abd. Leukopenia, Tmax 101.7. PCT 76.8>>46 4/11. Lactic acid downtrending 6.0>>3.8 4/10 4/12 >temp tr down, wbc improved 1.2>2.7   P:   Cont meropenem (4/9) Cultures pending Trend WBC and fever curve may need antifungal coverage.    ENDOCRINE A:  No Acute Issue   P:   -SSI  NEUROLOGIC A:  Pt baseline anxiety disorder and acute Encephalopathy - in setting of shock P:   Sedation protocol fent/versed  Ativan and haldol prn   PARRETT,TAMMY NP -C   01/04/2013, 9:21 AM [facility] [phone]  Reviewed above, examined pt, and agree with assessment/plan.  She has progressive infiltrate on CXR ?ALI/edema/Aspiration.  Remains on pressors.  Updated family at bedside.  CC time 35 minutes.  Coralyn Helling, MD [facility] Pulmonary/Critical Care 01/04/2013, 10:38 AM Pager:  [phone] After 3pm call: [phone]

## 2013-01-05 ENCOUNTER — Inpatient Hospital Stay (HOSPITAL_COMMUNITY): Payer: BC Managed Care – PPO

## 2013-01-05 LAB — CBC WITH DIFFERENTIAL/PLATELET
Basophils Relative: 0 % (ref 0–1)
Eosinophils Absolute: 0 10*3/uL (ref 0.0–0.7)
HCT: 31.9 % — ABNORMAL LOW (ref 36.0–46.0)
Hemoglobin: 11.5 g/dL — ABNORMAL LOW (ref 12.0–15.0)
Lymphocytes Relative: 6 % — ABNORMAL LOW (ref 12–46)
MCHC: 36.1 g/dL — ABNORMAL HIGH (ref 30.0–36.0)
Monocytes Relative: 3 % (ref 3–12)
Neutro Abs: 12.8 10*3/uL — ABNORMAL HIGH (ref 1.7–7.7)
Neutrophils Relative %: 91 % — ABNORMAL HIGH (ref 43–77)
RBC: 3.98 MIL/uL (ref 3.87–5.11)
Smear Review: DECREASED
WBC: 14 10*3/uL — ABNORMAL HIGH (ref 4.0–10.5)

## 2013-01-05 LAB — BLOOD GAS, ARTERIAL
MECHVT: 400 mL
MECHVT: 400 mL
O2 Saturation: 98 %
PEEP: 10 cmH2O
PEEP: 10 cmH2O
Patient temperature: 98.6
Patient temperature: 97.6
TCO2: 19.5 mmol/L (ref 0–100)
pCO2 arterial: 39.2 mmHg (ref 35.0–45.0)
pH, Arterial: 7.292 — ABNORMAL LOW (ref 7.350–7.450)
pH, Arterial: 7.357 (ref 7.350–7.450)

## 2013-01-05 LAB — CBC
HCT: 32.7 % — ABNORMAL LOW (ref 36.0–46.0)
MCV: 81.5 fL (ref 78.0–100.0)
RBC: 4.01 MIL/uL (ref 3.87–5.11)
WBC: 13.7 10*3/uL — ABNORMAL HIGH (ref 4.0–10.5)

## 2013-01-05 LAB — COMPREHENSIVE METABOLIC PANEL
AST: 306 U/L — ABNORMAL HIGH (ref 0–37)
Alkaline Phosphatase: 61 U/L (ref 39–117)
BUN: 26 mg/dL — ABNORMAL HIGH (ref 6–23)
CO2: 20 mEq/L (ref 19–32)
Chloride: 118 mEq/L — ABNORMAL HIGH (ref 96–112)
Creatinine, Ser: 0.85 mg/dL (ref 0.50–1.10)
GFR calc non Af Amer: 80 mL/min — ABNORMAL LOW (ref >90)
Potassium: 4.4 mEq/L (ref 3.5–5.1)
Total Bilirubin: 2.8 mg/dL — ABNORMAL HIGH (ref 0.3–1.2)

## 2013-01-05 LAB — PROTIME-INR
INR: 2.01 — ABNORMAL HIGH (ref 0.00–1.49)
Prothrombin Time: 22 seconds — ABNORMAL HIGH (ref 11.6–15.2)

## 2013-01-05 LAB — GLUCOSE, CAPILLARY
Glucose-Capillary: 134 mg/dL — ABNORMAL HIGH (ref 70–99)
Glucose-Capillary: 163 mg/dL — ABNORMAL HIGH (ref 70–99)
Glucose-Capillary: 181 mg/dL — ABNORMAL HIGH (ref 70–99)
Glucose-Capillary: 187 mg/dL — ABNORMAL HIGH (ref 70–99)

## 2013-01-05 MED ORDER — VANCOMYCIN HCL IN DEXTROSE 1-5 GM/200ML-% IV SOLN
1000.0000 mg | INTRAVENOUS | Status: AC
  Administered 2013-01-05: 1000 mg via INTRAVENOUS
  Filled 2013-01-05: qty 200

## 2013-01-05 MED ORDER — FUROSEMIDE 10 MG/ML IJ SOLN
INTRAMUSCULAR | Status: AC
  Administered 2013-01-05: 40 mg
  Filled 2013-01-05: qty 4

## 2013-01-05 MED ORDER — FUROSEMIDE 10 MG/ML IJ SOLN: 40.0000 mg | Freq: Once | INTRAMUSCULAR | Status: DC

## 2013-01-05 MED ORDER — VANCOMYCIN HCL IN DEXTROSE 1-5 GM/200ML-% IV SOLN
1000.0000 mg | INTRAVENOUS | Status: DC
  Administered 2013-01-06 – 2013-01-08 (×3): 1000 mg via INTRAVENOUS
  Filled 2013-01-05 (×4): qty 200

## 2013-01-05 MED ORDER — ACETAMINOPHEN 10 MG/ML IV SOLN
1000.0000 mg | Freq: Once | INTRAVENOUS | Status: AC
  Administered 2013-01-05: 1000 mg via INTRAVENOUS
  Filled 2013-01-05 (×2): qty 100

## 2013-01-05 MED ORDER — FUROSEMIDE 10 MG/ML IJ SOLN: 40.0000 mg | Freq: Once | INTRAMUSCULAR | Status: AC

## 2013-01-05 MED ORDER — ACETAMINOPHEN 650 MG RE SUPP
650.0000 mg | RECTAL | Status: DC | PRN
  Administered 2013-01-06: 650 mg via RECTAL
  Filled 2013-01-05: qty 1

## 2013-01-05 MED ORDER — CLINIMIX E/DEXTROSE (5/20) 5 % IV SOLN
INTRAVENOUS | Status: AC
  Administered 2013-01-05: 18:00:00 via INTRAVENOUS
  Filled 2013-01-05: qty 1000

## 2013-01-05 MED ORDER — SODIUM CHLORIDE 0.9 % IV SOLN
100.0000 mg | Freq: Every day | INTRAVENOUS | Status: DC
  Administered 2013-01-05 – 2013-01-08 (×4): 100 mg via INTRAVENOUS
  Filled 2013-01-05 (×6): qty 100

## 2013-01-05 MED ORDER — VITAMIN K1 10 MG/ML IJ SOLN
5.0000 mg | Freq: Once | INTRAVENOUS | Status: AC
  Administered 2013-01-05: 5 mg via INTRAVENOUS
  Filled 2013-01-05: qty 0.5

## 2013-01-05 NOTE — Progress Notes (Addendum)
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 Presented 4/8 with a 24 hour history of abdomnial pain, N/V, and minimal ostomy output. She was admitted for SBO. 4/9 she became tachycardiac and hypotensive. PCCM has been asked to see 4/9  SIGNIFICANT EVENTS / STUDIES:  4/8 - Abd XRay - Small Bowel Obstruction. No peritoneal air.  4/9 - CT abd/pelvis >>> ?LLL consolidation, SBO and adhesions, no free air 4/10 - ETT for tachypnea 4/11- OR lysis of adhesions after being febrile 4/12- Tm 103, repeat cx, add vancomycin/micafungin 4/13- Change to pressure control, lasix x 1 dose    LINES / TUBES: CVL 4/9 >>> NGT 4/9 >>> RIJ 4/9 >>> ETT 4/10 >>>  CULTURES: BC x 2 4/9 >>> Negative  Urine 4/9 >>> Negative  Repeat BC 4/13 >>   ANTIBIOTICS:  Meropenem 4/9 >>> Vancomycin 4/13 >> Micafungin 4/13 >>   HISTORY OF PRESENT ILLNESS:  47 year old female with a h/o Rectal CA, GERD, and bowel obstruction. 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 She has had multiple small bowel obstructions requiring admission. Just discharged 4/4 after being treated medically for SBO.  4/7 she began having abdominal pain with markedly decreased ostomy output. Pain radiated to back. Also had nausea and vomiting. Abd XRay was consistent with SBO. She was admitted to inpatient at [facility]. 4/9 became hypotensive and tachycardic. She was transferred to ICU and PCCM has been asked to consult in this setting.    SUBJECTIVE:  Remains on pressors and increased FiO2/PEEP.  Fever yesterday.     VITAL SIGNS: Temp:   [97.6 F (36.4 C)-103 F (39.4 C)] 98.3 F (36.8 C) (04/13 0745) Pulse Rate:  [89-123] 90 (04/13 0930) Resp:  [20-34] 26 (04/13 0930) BP: (94-155)/(57-114) 109/86 mmHg (04/13 0930) SpO2:  [92 %-100 %] 100 % (04/13 0930) Arterial Line BP: (72-127)/(49-85) 94/85 mmHg (04/13 0130) FiO2 (%):  [49.7 %-100 %] 60 % (04/13 0930) HEMODYNAMICS: CVP:  [17 mmHg-20 mmHg] 20 mmHg VENTILATOR SETTINGS: Vent Mode:  [-] PRVC FiO2 (%):  [49.7 %-100 %] 60 % Set Rate:  [20 bmp-26 bmp] 26 bmp Vt Set:  [400 mL-500 mL] 400 mL PEEP:  [5 cmH20-10 cmH20] 10 cmH20 Plateau Pressure:  [29 cmH20-32 cmH20] 32 cmH20 INTAKE / OUTPUT: Intake/Output     04/12 0701 - 04/13 0700 04/13 0701 - 04/14 0700   I.V. (mL/kg) 2638.6 (59) 215.2 (4.8)   Blood     NG/GT 60 20   IV Piggyback 305 100   TPN 1015 80   Total Intake(mL/kg) 4018.6 (89.9) 415.2 (9.3)   Urine (mL/kg/hr) 740 (0.7) 110 (0.9)   Emesis/NG output 300 (0.3)    Stool 100 (0.1) 50 (0.4)   Blood     Total Output 1140 160   Net +2878.6 +255.2          PHYSICAL EXAMINATION: General:  Petite female, acutely ill in appearance. Family at bedside. Neuro:  sedated HEENT: ETT, no JVD Cardiovascular:  tachy no m/r/g Lungs: upper respiratory noise.  Abdomen:  NGT, colostomy , surgical dsg intact and clean  Musculoskeletal:  Intact Skin:  Intact  LABS:  Recent Labs Lab 12/31/12 1225  01/01/13 1530  01/02/13 0500 01/02/13 0600  01/02/13 1130  01/03/13 0455  01/03/13 1806 01/03/13 2000 01/04/13 0645 01/05/13 0030 01/05/13 0349 01/05/13 0500 01/05/13 0600 01/05/13 0705  HGB 13.2  --  10.8*  < > 11.2*  --   --   --   < > 7.8*  < >  --  12.0  --  11.5*  --  11.7*  --   --   WBC 12.7*  --  2.0*  < > 3.3*  --   --   --   < > 1.4*  --   --  2.3*  --  14.0*  --  13.7*  --   --   PLT 261  --  169  < > 163  --   --   --   < > 84*  --   --  61*  --  53*  --  53*  --   --   NA 134*  --  140  < > 142  --   --  143  --  144  < >  --  151* 147*  --   --  148*   --   --   K 3.5  --  3.8  < > 4.4  --   --  3.4*  --  2.8*  < >  --  3.2* 4.0  --   --  4.4  --   --   CL 93*  --  114*  < > 120*  --   --  115*  --  116*  --   --  122* 120*  --   --  118*  --   --   CO2 27  --  13*  < > 11*  --   --  15*  --  16*  --   --  18* 20  --   --  20  --   --   GLUCOSE 114*  --  112*  < > 166*  --   --  298*  --  150*  --   --  172* 87  --   --  211*  --   --   BUN 15  --  23  < > 16  --   --  15  --  13  --   --  15 17  --   --  26*  --   --   CREATININE 0.58  --  1.01  < > 0.75  --   --  0.70  --  0.64  --   --  0.52 0.55  --   --  0.85  --   --   CALCIUM 9.6  --  6.5*  < > 6.4*  --   --  6.8*  --  7.8*  --   --  7.0* 7.8*  --   --  7.6*  --   --   MG  --   --   --   --  1.3*  --   --   --   --  2.3  --   --   --   --   --   --   --   --   --   PHOS  --   --   --   --  2.2*  --   --   --   --  1.8*  --   --   --  1.5*  --   --   --   --   --   AST 19  --  22  < > 68*  --   --  88*  --   --   --   --   --   --   --   --  306*  --   --   ALT 18  --  12  < > 34  --   --  56*  --   --   --   --   --   --   --   --  167*  --   --   ALKPHOS 91  --  33*  < > 33*  --   --  47  --   --   --   --   --   --   --   --  61  --   --   BILITOT 1.0  --  0.7  < > 0.3  --   --  0.5  --   --   --   --   --   --   --   --  2.8*  --   --   PROT 7.5  --  4.1*  < > 4.1*  --   --  5.4*  --   --   --   --   --   --   --   --  4.8*  --   --   ALBUMIN 4.1  --  1.9*  < > 1.7*  --   --  2.8*  --   --   --   --   --   --   --   --  2.2*  --   --   APTT  --   --  34  --   --   --   --   --   --   --   --   --   --   --   --   --   --   --   --   INR  --   < > 3.33*  --   --   --   --  1.54*  --  1.77*  --   --  1.52*  --   --   --  2.01*  --   --   LATICACIDVEN  --   < > 6.0*  --   --  3.8*  --  5.4*  --   --   --   --   --   --   --   --   --  2.4*  --   TROPONINI  --   --  <0.30  --   --   --   --   --   --   --   --   --   --   --   --   --   --   --   --   PROCALCITON  --   --  61.35  --   76.89  --   --   --   --  46.27  --   --   --   --   --   --   --   --   --   PHART  --   --   --   < >  --   --   < >  --   --   --   < >  7.333*  --   --   --  7.292*  --   --  7.357  PCO2ART  --   --   --   < >  --   --   < >  --   --   --   < > 32.4*  --   --   --  39.2  --   --  32.5*  PO2ART  --   --   --   < >  --   --   < >  --   --   --   < > 59.0*  --   --   --  79.7*  --   --  116.0*  < > = values in this interval not displayed.  Recent Labs Lab 01/04/13 1512 01/04/13 1928 01/05/13 0018 01/05/13 0409 01/05/13 0711  GLUCAP 176* 142* 181* 187* 178*    Imaging: Dg Chest Port 1 View  01/05/2013  *RADIOLOGY REPORT*  Clinical Data: Acute respiratory failure on ventilator.  PORTABLE CHEST - 1 VIEW  Comparison: 01/04/2013  Findings: Support apparatus remains in appropriate position.  Diffuse bilateral air space disease with bibasilar predominance shows no significant change.  Heart size remains stable.  IMPRESSION: No significant change in diffuse bilateral airspace disease.   Original Report Authenticated By: Myles Rosenthal, M.D.    Dg Chest Port 1 View  01/04/2013  *RADIOLOGY REPORT*  Clinical Data: Evaluate endotracheal tube.  PORTABLE CHEST - 1 VIEW  Comparison: 01/02/2013  Findings: Endotracheal tube remains near the carina. Endotracheal tube is roughly 9 mm above the carina.  Nasogastric tube is coiled in the gastric fundus.  Port-A-Cath tip in the lower SVC region. Right jugular central venous catheter in the SVC region.  There are increased patchy densities in both lungs.  No evidence for a pneumothorax.  Heart size is within normal limits.  IMPRESSION: Increased interstitial and airspace densities in both lungs. Findings could represent pulmonary edema or infection.  Support apparatuses as described.  The tip of the endotracheal tube is close to the carina. The endotracheal tube position was discussed with the patient's nursing staff on 01/04/2013 at 10:03 a.m.   Original Report  Authenticated By: Richarda Overlie, M.D.      ASSESSMENT / PLAN:  PULMONARY A: Resp Failure in setting of SIRS, and probable aspiration pneumonia. 4/13 > Chest x-ray no change - diffuse bilateral airspace disease. Unable to tolerate 8 cc/kg. P:   -vent support >> change to pressure control -check abg in am  - KVO. Hold IV fluids with possible pulmonary edema. -IV lasix 40 mg stat  CARDIOVASCULAR A: Shock hypovolemic + septic, conts on norepi  Sinus tachy P:  Titrate pressors for  MAP: > 65 mm/Hg target CVP:  > 8 mm/Hg   abx as below Check cvp q4    RENAL A:  Hypokalemic in setting of large NG output, hypomagnesemia and hypophosphatemia, good UOP. Low Calcium 7.6 (corrected 9.0 for hypoalbuminemia of 2.2). P:   Daily BMET Cont to replace and monitor  GASTROINTESTINAL A:  Small Bowel Obstrucition w/ complicated surgical bowel history. NG output ~800 mL in 24hr. Hx of colon CA, many sgy, chemo, XRT S/p exploration on 4/11 w/ lysis of adhesions  P:   -NGT to intermittent suction -Surgery following  -TPN and bowel rest per surgery -PPI   HEMATOLOGIC A:  Baseline microcytic anemia. Pancytopenia.  Thrombocytopenia  INR 1.5>>3.3>>1.7>>2.01 (recievced 3U FFP 4/10 and VitK 4/9) Blood /FFP 4/11  P:  -Monitor/Supportive Care -hold lovenox  -Repeat Vitamin K -SCD -INR daily    INFECTIOUS A:  Sepsis, presumed source intra-abd. Leukopenia, Tmax 101.7. PCT 76.8>>46 4/11. Lactic acid downtrending 6.0>>3.8 4/10 4/12 >temp tr down, wbc improved 1.2>2.7   P:   Meropenem (4/9) >>> Cultures pending Started Micafungin 4/13 >>  ENDOCRINE A:  No Acute Issue   P:   -SSI  NEUROLOGIC A:  Pt baseline anxiety disorder and acute Encephalopathy - in setting of shock P:   Sedation protocol fent/versed   Signed:  Dow Adolph, MD PGY-1 Internal Medicine Teaching Service Pager: [phone] 01/05/2013, 10:43 AM   Reviewed above, examined pt, and agree with assessment/plan.  She was not  able to tolerate Vt reduction to 8 cc/kg.  She tolerates pressure control better.  Has progressive infiltrates on CXR.  Will give lasix x one to improve oxygenation, and maintain BP with pressors for now.    Updated family at bedside.  CC time 35 minutes.  Coralyn Helling, MD [facility] Pulmonary/Critical Care 01/05/2013, 1:10 PM Pager:  [phone] After 3pm call: [phone]

## 2013-01-05 NOTE — Progress Notes (Signed)
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

## 2013-01-05 NOTE — Progress Notes (Signed)
ABG reviewed; worsening metabolic acidosis   Plan: increase VR to 26; obtain lactic acid with am labs; repeat ABG in 1h.

## 2013-01-05 NOTE — Progress Notes (Signed)
2 Days Post-Op  Subjective: Pt con't to be stable.  Con't On Levophed.  UOP at 30cc/hr x last 24h. Ostomy with bowel sweat  Objective: Vital signs in last 24 hours: Temp:  [97.6 F (36.4 C)-103 F (39.4 C)] 98.3 F (36.8 C) (04/13 0745) Pulse Rate:  [91-128] 91 (04/13 0746) Resp:  [20-34] 26 (04/13 0746) BP: (94-155)/(57-114) 105/78 mmHg (04/13 0746) SpO2:  [92 %-99 %] 99 % (04/13 0746) Arterial Line BP: (72-127)/(48-85) 94/85 mmHg (04/13 0130) FiO2 (%):  [49.7 %-100 %] 60 % (04/13 0746) Last BM Date: 01/01/13  Intake/Output from previous day: 04/12 0701 - 04/13 0700 In: 3865.7 [I.V.:2525.7; NG/GT:60; IV Piggyback:305; TPN:975] Out: 1140 [Urine:740; Emesis/NG output:300; Stool:100] Intake/Output this shift:    General appearance: sedated Resp: coarse BS bilat Cardio: regular rate and rhythm, S1, S2 normal, no murmur, click, rub or gallop GI: s/nd/decreased BS/wound c/d/i/ostomy patent  Lab Results:   Recent Labs  01/05/13 0030 01/05/13 0500  WBC 14.0* 13.7*  HGB 11.5* 11.7*  HCT 31.9* 32.7*  PLT 53* 53*   BMET  Recent Labs  01/04/13 0645 01/05/13 0500  NA 147* 148*  K 4.0 4.4  CL 120* 118*  CO2 20 20  GLUCOSE 87 211*  BUN 17 26*  CREATININE 0.55 0.85  CALCIUM 7.8* 7.6*   PT/INR  Recent Labs  01/03/13 2000 01/05/13 0500  LABPROT 17.9* 22.0*  INR 1.52* 2.01*   ABG  Recent Labs  01/05/13 0349 01/05/13 0705  PHART 7.292* 7.357  HCO3 18.3* 17.9*    Studies/Results: Dg Chest Port 1 View  01/05/2013  *RADIOLOGY REPORT*  Clinical Data: Acute respiratory failure on ventilator.  PORTABLE CHEST - 1 VIEW  Comparison: 01/04/2013  Findings: Support apparatus remains in appropriate position.  Diffuse bilateral air space disease with bibasilar predominance shows no significant change.  Heart size remains stable.  IMPRESSION: No significant change in diffuse bilateral airspace disease.   Original Report Authenticated By: Myles Rosenthal, M.D.    Dg Chest Port  1 View  01/04/2013  *RADIOLOGY REPORT*  Clinical Data: Evaluate endotracheal tube.  PORTABLE CHEST - 1 VIEW  Comparison: 01/02/2013  Findings: Endotracheal tube remains near the carina. Endotracheal tube is roughly 9 mm above the carina.  Nasogastric tube is coiled in the gastric fundus.  Port-A-Cath tip in the lower SVC region. Right jugular central venous catheter in the SVC region.  There are increased patchy densities in both lungs.  No evidence for a pneumothorax.  Heart size is within normal limits.  IMPRESSION: Increased interstitial and airspace densities in both lungs. Findings could represent pulmonary edema or infection.  Support apparatuses as described.  The tip of the endotracheal tube is close to the carina. The endotracheal tube position was discussed with the patient's nursing staff on 01/04/2013 at 10:03 a.m.   Original Report Authenticated By: Richarda Overlie, M.D.     Anti-infectives: Anti-infectives   Start     Dose/Rate Route Frequency Ordered Stop   01/06/13 0000  vancomycin (VANCOCIN) IVPB 1000 mg/200 mL premix     1,000 mg 200 mL/hr over 60 Minutes Intravenous Every 24 hours 01/05/13 0106     01/05/13 1000  micafungin (MYCAMINE) 100 mg in sodium chloride 0.9 % 100 mL IVPB     100 mg 100 mL/hr over 1 Hours Intravenous Daily 01/05/13 0030     01/05/13 0115  vancomycin (VANCOCIN) IVPB 1000 mg/200 mL premix     1,000 mg 200 mL/hr over 60 Minutes Intravenous NOW 01/05/13  0106 01/05/13 0221   01/02/13 0100  meropenem (MERREM) 1 g in sodium chloride 0.9 % 100 mL IVPB     1 g 200 mL/hr over 30 Minutes Intravenous 2 times daily 01/01/13 2354     01/01/13 1600  meropenem (MERREM) 1 g in sodium chloride 0.9 % 100 mL IVPB     1 g 200 mL/hr over 30 Minutes Intravenous STAT 01/01/13 1535 01/01/13 1640      Assessment/Plan: s/p Procedure(s): EXPLORATORY LAPAROTOMY WITH LYSIS OF ADHESIONS, revision of ileostomy (N/A) Continue abx Critical care per CCM Continue TPN and bowel  rest    LOS: 5 days    Marigene Ehlers., [facility] 01/05/2013

## 2013-01-05 NOTE — Progress Notes (Signed)
ANTIBIOTIC CONSULT NOTE - INITIAL  Pharmacy Consult for vancomycin Indication: sepsis with possible line infection  No Known Allergies  Patient Measurements: Height: 4\' 4"  (132.1 cm) Weight: 98 lb 8.7 oz (44.7 kg) IBW/kg (Calculated) : 27.1  Vital Signs: Temp: 102.9 F (39.4 C) (04/13 0015) Temp src: Oral (04/12 2000) BP: 101/72 mmHg (04/13 0015) Pulse Rate: 110 (04/13 0015) Intake/Output from previous day: 04/12 0701 - 04/13 0700 In: 2508.5 [I.V.:1488.5; NG/GT:60; IV Piggyback:305; TPN:655] Out: 1035 [Urine:635; Emesis/NG output:300; Stool:100] Intake/Output from this shift: Total I/O In: 381 [I.V.:261; TPN:120] Out: 130 [Urine:130]  Labs:  Recent Labs  01/02/13 1146 01/03/13 0455 01/03/13 1419 01/03/13 2000 01/04/13 0645  WBC 1.2* 1.4*  --  2.3*  --   HGB 8.3* 7.8* 10.2* 12.0  --   PLT 100* 84*  --  61*  --   CREATININE  --  0.64  --  0.52 0.55   Estimated Creatinine Clearance: 46.8 ml/min (by C-G formula based on Cr of 0.55).   Microbiology: Recent Results (from the past 720 hour(s))  MRSA PCR SCREENING     Status: None   Collection Time    01/01/13  2:12 PM      Result Value Range Status   MRSA by PCR NEGATIVE  NEGATIVE Final   Comment:            The GeneXpert MRSA Assay (FDA     approved for NASAL specimens     only), is one component of a     comprehensive MRSA colonization     surveillance program. It is not     intended to diagnose MRSA     infection nor to guide or     monitor treatment for     MRSA infections.  CULTURE, BLOOD (ROUTINE X 2)     Status: None   Collection Time    01/01/13  3:04 PM      Result Value Range Status   Specimen Description BLOOD CENTRAL LINE   Final   Special Requests BOTTLES DRAWN AEROBIC AND ANAEROBIC 10CC   Final   Culture  Setup Time 01/01/2013 21:21   Final   Culture     Final   Value:        BLOOD CULTURE RECEIVED NO GROWTH TO DATE CULTURE WILL BE HELD FOR 5 DAYS BEFORE ISSUING A FINAL NEGATIVE REPORT   Report Status PENDING   Incomplete  CULTURE, BLOOD (ROUTINE X 2)     Status: None   Collection Time    01/01/13  3:09 PM      Result Value Range Status   Specimen Description BLOOD CENTRAL LINE   Final   Special Requests BOTTLES DRAWN AEROBIC AND ANAEROBIC 10CC   Final   Culture  Setup Time 01/01/2013 21:19   Final   Culture     Final   Value:        BLOOD CULTURE RECEIVED NO GROWTH TO DATE CULTURE WILL BE HELD FOR 5 DAYS BEFORE ISSUING A FINAL NEGATIVE REPORT   Report Status PENDING   Incomplete    Medical History: Past Medical History  Diagnosis Date  . Anemia   . Diarrhea   . Anxiety   . Bright red rectal bleeding 09/13/2011  . History of chemotherapy     completed 09/2011   . Hx of radiation therapy 08/29/11 to 10/09/11    rectum  . GERD (gastroesophageal reflux disease)   . Ileostomy in place   . Cancer   .  Rectal cancer     Medications:  Prescriptions prior to admission  Medication Sig Dispense Refill  . B Complex-C (B-COMPLEX WITH VITAMIN C) tablet Take 1 tablet by mouth daily.      . ferrous sulfate 325 (65 FE) MG tablet Take 325 mg by mouth daily with breakfast.      . lactose free nutrition (BOOST) LIQD Take 237 mLs by mouth 2 (two) times daily.      . Multiple Vitamin (MULTIVITAMIN) tablet Take 1 tablet by mouth every morning.       . pantoprazole (PROTONIX) 40 MG tablet Take 40 mg by mouth every morning.       . potassium chloride SA (K-DUR,KLOR-CON) 20 MEQ tablet Take 20 mEq by mouth every morning.      . Simethicone 125 MG CAPS Take 1 capsule by mouth 4 (four) times daily as needed (for gas or upset stomach).        Scheduled:  . acetaminophen  1,000 mg Intravenous Once  . [EXPIRED] albumin human  25 g Intravenous Q6H  . antiseptic oral rinse  15 mL Mouth Rinse q12n4p  . chlorhexidine  15 mL Mouth/Throat BID  . [COMPLETED] dextrose      . insulin aspart  0-9 Units Subcutaneous Q4H  . meropenem (MERREM) IV  1 g Intravenous BID  . micafungin (MYCAMINE) IV   100 mg Intravenous Daily  . pantoprazole (PROTONIX) IV  40 mg Intravenous Q12H  . [COMPLETED] potassium chloride  10 mEq Intravenous Q1 Hr x 6  . [COMPLETED] potassium phosphate IVPB (mmol)  30 mmol Intravenous Once  . sodium chloride  10-40 mL Intracatheter Q12H  . vancomycin  1,000 mg Intravenous NOW  . [START ON 01/06/2013] vancomycin  1,000 mg Intravenous Q24H  . [DISCONTINUED] chlorhexidine  15 mL Mouth Rinse BID   Infusions:  . [EXPIRED] .TPN (CLINIMIX-E) Adult 40 mL/hr at 01/03/13 1748  . Marland KitchenTPN (CLINIMIX-E) Adult 40 mL/hr at 01/04/13 1900  . sodium chloride 75 mL/hr (01/04/13 1900)  . [EXPIRED] fat emulsion 250 mL (01/03/13 1748)  . fentaNYL infusion INTRAVENOUS 200 mcg/hr (01/04/13 2225)  . midazolam (VERSED) infusion 2 mg/hr (01/04/13 2201)  . norepinephrine (LEVOPHED) Adult infusion 30 mcg/min (01/04/13 2305)  . [DISCONTINUED] sodium chloride 999 mL/hr at 01/01/13 1315  . [DISCONTINUED] norepinephrine (LEVOPHED) Adult infusion 10 mcg/min (01/04/13 1500)    Assessment: 47yo female already begun on Merrem for sepsis, now with concern for IV/TNA line infection, to add vancomycin to regimen.  Goal of Therapy:  Vancomycin trough level 15-20 mcg/ml  Plan:  Will begin vancomycin 1000mg  IV Q24H and monitor CBC, Cx, levels prn.  Vernard Gambles, PharmD, BCPS  01/05/2013,1:08 AM

## 2013-01-05 NOTE — Progress Notes (Addendum)
PARENTERAL NUTRITION CONSULT NOTE - Follow Up  Pharmacy Consult for TPN Indication: SBO  No Known Allergies  Patient Measurements: Height: 4\' 4"  (132.1 cm) Weight: 98 lb 8.7 oz (44.7 kg) IBW/kg (Calculated) : 27.1 Usual Weight: 95 lb  Vital Signs: Temp: 98.3 F (36.8 C) (04/13 0745) Temp src: Oral (04/13 0745) BP: 98/52 mmHg (04/13 1150) Pulse Rate: 95 (04/13 1150) Intake/Output from previous day: 04/12 0701 - 04/13 0700 In: 4018.6 [I.V.:2638.6; NG/GT:60; IV Piggyback:305; TPN:1015] Out: 1140 [Urine:740; Emesis/NG output:300; Stool:100] Intake/Output from this shift: Total I/O In: 415.2 [I.V.:215.2; NG/GT:20; IV Piggyback:100; TPN:80] Out: 240 [Urine:190; Stool:50]  Labs:  Recent Labs  01/03/13 0455  01/03/13 2000 01/05/13 0030 01/05/13 0500  WBC 1.4*  --  2.3* 14.0* 13.7*  HGB 7.8*  < > 12.0 11.5* 11.7*  HCT 21.3*  < > 32.6* 31.9* 32.7*  PLT 84*  --  61* 53* 53*  INR 1.77*  --  1.52*  --  2.01*  < > = values in this interval not displayed.   Recent Labs  01/03/13 0455  01/03/13 2000 01/04/13 0645 01/05/13 0500  NA 144  < > 151* 147* 148*  K 2.8*  < > 3.2* 4.0 4.4  CL 116*  --  122* 120* 118*  CO2 16*  --  18* 20 20  GLUCOSE 150*  --  172* 87 211*  BUN 13  --  15 17 26*  CREATININE 0.64  --  0.52 0.55 0.85  CALCIUM 7.8*  --  7.0* 7.8* 7.6*  MG 2.3  --   --   --   --   PHOS 1.8*  --   --  1.5* 3.8  PROT  --   --   --   --  4.8*  ALBUMIN  --   --   --   --  2.2*  AST  --   --   --   --  306*  ALT  --   --   --   --  167*  ALKPHOS  --   --   --   --  61  BILITOT  --   --   --   --  2.8*  < > = values in this interval not displayed. Estimated Creatinine Clearance: 44 ml/min (by C-G formula based on Cr of 0.85).    Recent Labs  01/05/13 0409 01/05/13 0711 01/05/13 1146  GLUCAP 187* 178* 120*    Medical History: Past Medical History  Diagnosis Date  . Anemia   . Diarrhea   . Anxiety   . Bright red rectal bleeding 09/13/2011  . History of  chemotherapy     completed 09/2011   . Hx of radiation therapy 08/29/11 to 10/09/11    rectum  . GERD (gastroesophageal reflux disease)   . Ileostomy in place   . Cancer   . Rectal cancer     Medications:  Prescriptions prior to admission  Medication Sig Dispense Refill  . B Complex-C (B-COMPLEX WITH VITAMIN C) tablet Take 1 tablet by mouth daily.      . ferrous sulfate 325 (65 FE) MG tablet Take 325 mg by mouth daily with breakfast.      . lactose free nutrition (BOOST) LIQD Take 237 mLs by mouth 2 (two) times daily.      . Multiple Vitamin (MULTIVITAMIN) tablet Take 1 tablet by mouth every morning.       . pantoprazole (PROTONIX) 40 MG tablet Take 40 mg by  mouth every morning.       . potassium chloride SA (K-DUR,KLOR-CON) 20 MEQ tablet Take 20 mEq by mouth every morning.      . Simethicone 125 MG CAPS Take 1 capsule by mouth 4 (four) times daily as needed (for gas or upset stomach).         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 Complicated by stricture requiring dilatation. This is now the 4th episode of small bowel obstruction requiring admission(she was discharged on 4/4). Patient presents with 24 hr of minimal ostomy output, abdominal and back pain, nausea and emesis. Xray in ER shows findings consistent with SBO. Plan back to OR for exploratory surgery when more stable,  Delay in advancing TPN to goal rate due to refeeding.  Lytes now more stable.  Plan to hold IV fluids with possible pulm edema Lytes:  K 4.4 , Phos 3.8, Ca 7.6 Corr 9.0 Renal:  SrCr 0.85, CrCl ~44, UOP 0.7 ml/kg/hr Cards:  Pt experienced tachycardia and hypotension requiring transfer to ICU . On levophed drip  BP 98/52 HR 95 Heme:  Pancytopenia, WBC 13.7, Hg 11.7, PTLC 53   Repeat Vit K for INR  2.01 Resp:  Intubated for resp insufficiency, SIRS and ? Asp PNA GI:  AST/ALT elevated, t bili 2.8    NGT with  800 ml out   ID:  Meropenem, micafungin and vancomycin   WBC 13.7, Afeb  Cultures neg to date, CXR pulm edema or infection   Nutritional Goals:  1246 kCal, 50-60 grams of protein per day Clinimix E 5/20 at 50 ml/hr will provide 60 gm protein daily and aver 1262 Kcal ( with lipids MWF)  Plan:  Change Clinimix E to 5/20 at 40 ml/hr. Cont sens ssi.  May dc if tolerates TPN at goal rate Additional fluids as per MD Lipids, MVI and trace elements on MWF only due to national backorder. F/u am labs   Woodfin Ganja 01/05/2013,12:23 PM

## 2013-01-06 ENCOUNTER — Encounter: Payer: BC Managed Care – PPO | Admitting: Physical Therapy

## 2013-01-06 ENCOUNTER — Inpatient Hospital Stay (HOSPITAL_COMMUNITY): Payer: BC Managed Care – PPO

## 2013-01-06 DIAGNOSIS — R198 Other specified symptoms and signs involving the digestive system and abdomen: Secondary | ICD-10-CM

## 2013-01-06 DIAGNOSIS — Z932 Ileostomy status: Secondary | ICD-10-CM

## 2013-01-06 DIAGNOSIS — E876 Hypokalemia: Secondary | ICD-10-CM

## 2013-01-06 DIAGNOSIS — Z923 Personal history of irradiation: Secondary | ICD-10-CM

## 2013-01-06 LAB — POCT I-STAT 3, ART BLOOD GAS (G3+)
Bicarbonate: 25 mEq/L — ABNORMAL HIGH (ref 20.0–24.0)
Bicarbonate: 25.5 mEq/L — ABNORMAL HIGH (ref 20.0–24.0)
Patient temperature: 99.8
TCO2: 26 mmol/L (ref 0–100)
TCO2: 26 mmol/L (ref 0–100)
pCO2 arterial: 31.4 mmHg — ABNORMAL LOW (ref 35.0–45.0)
pCO2 arterial: 31.9 mmHg — ABNORMAL LOW (ref 35.0–45.0)
pH, Arterial: 7.512 — ABNORMAL HIGH (ref 7.350–7.450)
pH, Arterial: 7.511 — ABNORMAL HIGH (ref 7.350–7.450)
pO2, Arterial: 100 mmHg (ref 80.0–100.0)
pO2, Arterial: 118 mmHg — ABNORMAL HIGH (ref 80.0–100.0)

## 2013-01-06 LAB — DIFFERENTIAL
Basophils Absolute: 0.1 10*3/uL (ref 0.0–0.1)
Basophils Relative: 1 % (ref 0–1)
Eosinophils Absolute: 0.1 10*3/uL (ref 0.0–0.7)
Lymphocytes Relative: 3 % — ABNORMAL LOW (ref 12–46)
Lymphs Abs: 0.4 10*3/uL — ABNORMAL LOW (ref 0.7–4.0)
Monocytes Absolute: 0.3 10*3/uL (ref 0.1–1.0)
Neutro Abs: 12.4 10*3/uL — ABNORMAL HIGH (ref 1.7–7.7)

## 2013-01-06 LAB — BLOOD GAS, ARTERIAL
Acid-base deficit: 4.7 mmol/L — ABNORMAL HIGH (ref 0.0–2.0)
Drawn by: 35849
Drawn by: 252031
O2 Saturation: 95.1 %
PEEP: 10 cmH2O
PEEP: 8 cmH2O
Patient temperature: 99.2
Pressure control: 20 cmH2O
RATE: 26 resp/min
RATE: 26 resp/min
TCO2: 24 mmol/L (ref 0–100)
pCO2 arterial: 37.4 mmHg (ref 35.0–45.0)
pCO2 arterial: 23.4 mmHg — ABNORMAL LOW (ref 35.0–45.0)
pH, Arterial: 7.603 (ref 7.350–7.450)
pO2, Arterial: 81.9 mmHg (ref 80.0–100.0)

## 2013-01-06 LAB — CBC
HCT: 31.9 % — ABNORMAL LOW (ref 36.0–46.0)
Hemoglobin: 11.8 g/dL — ABNORMAL LOW (ref 12.0–15.0)
MCH: 29.6 pg (ref 26.0–34.0)
MCHC: 37 g/dL — ABNORMAL HIGH (ref 30.0–36.0)
RDW: 15.7 % — ABNORMAL HIGH (ref 11.5–15.5)

## 2013-01-06 LAB — BASIC METABOLIC PANEL
BUN: 26 mg/dL — ABNORMAL HIGH (ref 6–23)
BUN: 28 mg/dL — ABNORMAL HIGH (ref 6–23)
CO2: 26 mEq/L (ref 19–32)
CO2: 28 mEq/L (ref 19–32)
Calcium: 8.3 mg/dL — ABNORMAL LOW (ref 8.4–10.5)
Chloride: 115 mEq/L — ABNORMAL HIGH (ref 96–112)
Chloride: 112 mEq/L (ref 96–112)
Chloride: 113 mEq/L — ABNORMAL HIGH (ref 96–112)
Creatinine, Ser: 0.68 mg/dL (ref 0.50–1.10)
Creatinine, Ser: 0.74 mg/dL (ref 0.50–1.10)
Creatinine, Ser: 0.68 mg/dL (ref 0.50–1.10)
GFR calc Af Amer: >90 mL/min (ref >90)
GFR calc Af Amer: >90 mL/min (ref >90)
Glucose, Bld: 185 mg/dL — ABNORMAL HIGH (ref 70–99)
Glucose, Bld: 246 mg/dL — ABNORMAL HIGH (ref 70–99)
Potassium: 3 mEq/L — ABNORMAL LOW (ref 3.5–5.1)

## 2013-01-06 LAB — FIBRINOGEN: Fibrinogen: 706 mg/dL — ABNORMAL HIGH (ref 204–475)

## 2013-01-06 LAB — GLUCOSE, CAPILLARY
Glucose-Capillary: 141 mg/dL — ABNORMAL HIGH (ref 70–99)
Glucose-Capillary: 146 mg/dL — ABNORMAL HIGH (ref 70–99)
Glucose-Capillary: 150 mg/dL — ABNORMAL HIGH (ref 70–99)
Glucose-Capillary: 170 mg/dL — ABNORMAL HIGH (ref 70–99)

## 2013-01-06 LAB — URINE CULTURE: Culture: NO GROWTH

## 2013-01-06 LAB — PROTIME-INR: Prothrombin Time: 19.1 seconds — ABNORMAL HIGH (ref 11.6–15.2)

## 2013-01-06 LAB — CORTISOL: Cortisol, Plasma: 17.4 ug/dL

## 2013-01-06 MED ORDER — POTASSIUM CHLORIDE 10 MEQ/50ML IV SOLN: 10.0000 meq | INTRAVENOUS | Status: DC

## 2013-01-06 MED ORDER — DEXTROSE 5 % IV SOLN
INTRAVENOUS | Status: DC
  Administered 2013-01-06: 12:00:00 via INTRAVENOUS

## 2013-01-06 MED ORDER — ACETAMINOPHEN 10 MG/ML IV SOLN
675.0000 mg | Freq: Four times a day (QID) | INTRAVENOUS | Status: AC | PRN
  Filled 2013-01-06: qty 67.5

## 2013-01-06 MED ORDER — POTASSIUM CHLORIDE 10 MEQ/50ML IV SOLN
INTRAVENOUS | Status: AC
  Administered 2013-01-06: 10 meq via INTRAVENOUS
  Filled 2013-01-06: qty 50

## 2013-01-06 MED ORDER — MAGNESIUM SULFATE 40 MG/ML IJ SOLN
2.0000 g | Freq: Once | INTRAMUSCULAR | Status: AC
  Administered 2013-01-06: 2 g via INTRAVENOUS
  Filled 2013-01-06: qty 50

## 2013-01-06 MED ORDER — MIDAZOLAM HCL 2 MG/2ML IJ SOLN
2.0000 mg | INTRAMUSCULAR | Status: DC | PRN
  Administered 2013-01-06 – 2013-01-07 (×4): 2 mg via INTRAVENOUS
  Administered 2013-01-07: 4 mg via INTRAVENOUS
  Filled 2013-01-06 (×4): qty 2
  Filled 2013-01-06: qty 4

## 2013-01-06 MED ORDER — TRACE MINERALS CR-CU-F-FE-I-MN-MO-SE-ZN IV SOLN
INTRAVENOUS | Status: AC
  Administered 2013-01-06: 17:00:00 via INTRAVENOUS
  Filled 2013-01-06: qty 1000

## 2013-01-06 MED ORDER — FUROSEMIDE 10 MG/ML IJ SOLN
20.0000 mg | Freq: Once | INTRAMUSCULAR | Status: AC
  Administered 2013-01-06: 20 mg via INTRAVENOUS
  Filled 2013-01-06: qty 2

## 2013-01-06 MED ORDER — SODIUM GLYCEROPHOSPHATE 1 MMOLE/ML IV SOLN
10.0000 mmol | Freq: Once | INTRAVENOUS | Status: AC
  Administered 2013-01-06: 10 mmol via INTRAVENOUS
  Filled 2013-01-06: qty 10

## 2013-01-06 MED ORDER — FAT EMULSION 20 % IV EMUL
216.0000 mL | INTRAVENOUS | Status: AC
  Administered 2013-01-06: 216 mL via INTRAVENOUS
  Filled 2013-01-06: qty 250

## 2013-01-06 MED ORDER — POTASSIUM CHLORIDE 10 MEQ/50ML IV SOLN
10.0000 meq | INTRAVENOUS | Status: AC
  Administered 2013-01-06 (×5): 10 meq via INTRAVENOUS
  Filled 2013-01-06: qty 300

## 2013-01-06 MED ORDER — POTASSIUM CHLORIDE 10 MEQ/50ML IV SOLN
10.0000 meq | INTRAVENOUS | Status: AC
  Administered 2013-01-06 – 2013-01-07 (×4): 10 meq via INTRAVENOUS
  Filled 2013-01-06: qty 250

## 2013-01-06 NOTE — Progress Notes (Signed)
UR Completed.  Renee Nicholson [phone] 01/06/2013  

## 2013-01-06 NOTE — Progress Notes (Addendum)
PARENTERAL NUTRITION CONSULT NOTE - Follow Up  Pharmacy Consult for TPN Indication: SBO  No Known Allergies  Patient Measurements: Height: 4\' 4"  (132.1 cm) Weight: 98 lb 8.7 oz (44.7 kg) IBW/kg (Calculated) : 27.1 Usual Weight: 95 lb  Vital Signs: Temp: 99.8 F (37.7 C) (04/14 0427) Temp src: Oral (04/14 0427) BP: 94/62 mmHg (04/14 0645) Pulse Rate: 99 (04/14 0645) Intake/Output from previous day: 04/13 0701 - 04/14 0700 In: 1934 [I.V.:824; NG/GT:40; IV Piggyback:150; TPN:920] Out: 5520 [Urine:4910; Emesis/NG output:150; Stool:460] Intake/Output from this shift:    Labs:  Recent Labs  01/03/13 2000 01/05/13 0030 01/05/13 0500 01/06/13 0430  WBC 2.3* 14.0* 13.7* 13.3*  HGB 12.0 11.5* 11.7* 11.8*  HCT 32.6* 31.9* 32.7* 31.9*  PLT 61* 53* 53* 43*  INR 1.52*  --  2.01* 1.66*     Recent Labs  01/04/13 0645 01/05/13 0500 01/06/13 0430  NA 147* 148* 150*  K 4.0 4.4 3.0*  CL 120* 118* 115*  CO2 20 20 26   GLUCOSE 87 211* 185*  BUN 17 26* 26*  CREATININE 0.55 0.85 0.68  CALCIUM 7.8* 7.6* 8.3*  PHOS 1.5* 3.8  --   PROT  --  4.8*  --   ALBUMIN  --  2.2*  --   AST  --  306*  --   ALT  --  167*  --   ALKPHOS  --  61  --   BILITOT  --  2.8*  --    Estimated Creatinine Clearance: 46.8 ml/min (by C-G formula based on Cr of 0.68).    Recent Labs  01/05/13 1919 01/05/13 2335 01/06/13 0341  GLUCAP 134* 141* 146*    Medical History: Past Medical History  Diagnosis Date  . Anemia   . Diarrhea   . Anxiety   . Bright red rectal bleeding 09/13/2011  . History of chemotherapy     completed 09/2011   . Hx of radiation therapy 08/29/11 to 10/09/11    rectum  . GERD (gastroesophageal reflux disease)   . Ileostomy in place   . Cancer   . Rectal cancer     Insulin Requirements in the past 24 hours:  11 units  Current Nutrition:  Clinimix E 5/20 infusing at 40 ml/hr (48gm Protein, avg of 1059 Kcal with 3 x weekly lipid supplementation)  Nutritional Goals:   1246 kCal, 50-60 grams of protein per day  Assessment:  Ms. 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 Hx of recurrent SBO noted. Admitted 4/8 (discharged 4/4) with same, now with ileus s/p LOA and ileostomy revision.  GI: Continues NPO, TPN not at goal d/t refeeding. Noted NGT output decreased, but ongoing. Stool reported in ileostomy. Endo: No hx DM, CBG at goal, < 150 Lytes: Na elevated, K low- replacement pending. Hyperchloremia noted- ? compensating for respiratory alkalosis. IVF being held d/t possible pulm edema. Renal: Scr remains stable, nml. UOP is excellent with lasix administration. Pulm: Possible pulm edema per MD notes, vent day #5 Cards: MAP at goal with norepinephrine at 8 mcg/hr Hepatobil: LFTs, Tbili elevated from baseline (alkphos surprisingly nml). Now on TPN x 5 days- could be d/t cholestasis. This can be alleviated with cyclic TPN or enteral feeding. She is not a candidate for the former as fluid loads will be high over a shorter period of time- not ideal given her pulmonary status. GI feeding may be feasible if NGT output continues to decline.  Neuro:  Fent 200/Midaz 2, GCS 3, RASS -4 (goal 0) ID:  Afeb, WBC elevated at 13.3 4/9: Merrem>> 4/13 Vanc >> 4/13 Mycofungin >>  4/9: Bld x 2 : ngtd 4/9: MRSA pcr: neg 4/13: Blood:   Best Practices: MC, IV PPI, SCDs TPN Access: RIJ CVC TPN day#: 6  Plan:  - Will give 6 runs of KCL, noted BMET ordered for 2000, will check Mg and Phos at that time. - Maintain Clinimix E 5/20 at 40 cc/hr (80% goal) - Will f/up for progress with EN - Will f/up prealbumin in AM - Will supplement MVI, trace elements and IV fats on MWF due to ongoing Publishing copy. - Check BMET, Mg and Phos in AM  Thanks, Rasaan Brotherton K. Allena Katz, PharmD, BCPS.  Clinical Pharmacist Pager [phone]. 01/06/2013 7:52 AM

## 2013-01-06 NOTE — Progress Notes (Signed)
K+ replacement cancelled d/t resident ordering

## 2013-01-06 NOTE — Progress Notes (Signed)
CRITICAL VALUE ALERT  Critical value received: pH =7.60  Date of notification:  01-06-13  Time of notification:  0555  Critical value read back:yes  Nurse who received alert:  Corliss Skains  MD notified (1st page):  Dr. Burtis Junes  Time of first page:  0555  MD notified (2nd page):  Time of second page:  Responding MD:  Dr. Burtis Junes  Time MD responded:  [phone]

## 2013-01-06 NOTE — Progress Notes (Signed)
[facility] ADULT ICU REPLACEMENT PROTOCOL FOR AM LAB REPLACEMENT ONLY  The patient does apply for the [facility] Adult ICU Electrolyte Replacment Protocol based on the criteria listed below:   1. Is GFR >/= 40 ml/min? yes  Patient's GFR today is >90 2. Is urine output >/= 0.5 ml/kg/hr for the last 6 hours? yes Patient's UOP is 2.4 ml/kg/hr 3. Is BUN < 60 mg/dL? yes  Patient's BUN today is 26 4. Abnormal electrolyte(s): K+3 5. Ordered repletion with: protocol 6. If a panic level lab has been reported, has the CCM MD in charge been notified? yes.   Physician:  Dr Juel Burrow, Einar Gip 01/06/2013 5:36 AM

## 2013-01-06 NOTE — Progress Notes (Addendum)
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 Presented 4/8 with a 24 hour history of abdomnial pain, N/V, and minimal ostomy output. She was admitted for SBO. 4/9 she became tachycardiac and hypotensive. PCCM has been asked to see 4/9  SIGNIFICANT EVENTS / STUDIES:  4/8 - Abd XRay - Small Bowel Obstruction. No peritoneal air.  4/9 - CT abd/pelvis >>> ?LLL consolidation, SBO and adhesions, no free air 4/10 - ETT for tachypnea 4/11- OR lysis of adhesions after being febrile 4/12- Tm 103, repeat cx, add vancomycin/micafungin 4/13- Change to pressure control, lasix x 1 dose   LINES / TUBES: CVL 4/9 >>> NGT 4/9 >>> RIJ 4/9 >>> ETT 4/10 >>>  CULTURES: BC x 2 4/9 >>> Negative  Urine 4/9 >>> Negative  Repeat BC 4/13 >> NGTD  ANTIBIOTICS:  Meropenem 4/9 >>> Vancomycin 4/13 >> Micafungin 4/13 >>  SUBJECTIVE:  Remains on pressors and increased FiO2/PEEP. Afebrile.   VITAL SIGNS: Temp:  [98.6 F (37 C)-99.8 F (37.7 C)] 99.7 F (37.6 C) (04/14 0800) Pulse Rate:  [88-114] 104 (04/14 0744) Resp:  [20-41] 20 (04/14 0645) BP: (77-153)/(52-106) 113/80 mmHg (04/14 0744) SpO2:  [76 %-100 %] 100 % (04/14 0645) FiO2 (%):  [39.3 %-99.1 %] 40 % (04/14 0745) HEMODYNAMICS: CVP:  [11 mmHg-12 mmHg] 12 mmHg VENTILATOR SETTINGS: Vent Mode:  [-] PC;PCV FiO2 (%):  [39.3 %-99.1 %] 40 % Set Rate:  [20 bmp-26 bmp] 20 bmp Vt Set:  [240 mL] 240 mL PEEP:  [7.9 cmH20-10 cmH20] 7.9 cmH20 Plateau Pressure:  [14 cmH20-28 cmH20] 28 cmH20 INTAKE / OUTPUT: Intake/Output     04/13 0701 - 04/14 0700 04/14 0701 - 04/15 0700   I.V. (mL/kg) 824 (18.4)     NG/GT 40    IV Piggyback 150    TPN 920    Total Intake(mL/kg) 1934 (43.3)    Urine (mL/kg/hr) 4910 (4.6)    Emesis/NG output 150 (0.1)    Stool 460 (0.4)    Total Output 5520     Net -3586            PHYSICAL EXAMINATION: General:  Petite female, acutely ill in appearance. Family at bedside. Neuro:  sedated HEENT: ETT, no JVD Cardiovascular:  tachy no m/r/g Lungs: upper respiratory noise.  Abdomen:  NGT, colostom , surgical dsg intact and clean. Colostomy bag has greenish appearing contents about 100cc  Musculoskeletal:  Intact Skin:  Intact  LABS:  Recent Labs Lab 12/31/12 1225  01/01/13 1530  01/02/13 0500 01/02/13 0600  01/02/13 1130  01/03/13 0455  01/03/13 2000 01/04/13 0645 01/05/13 0030  01/05/13 0500 01/05/13 0600 01/05/13 0705 01/05/13 1240 01/06/13 0430 01/06/13 0537  HGB 13.2  --  10.8*  < > 11.2*  --   --   --   < > 7.8*  < > 12.0  --  11.5*  --  11.7*  --   --   --  11.8*  --   WBC 12.7*  --  2.0*  < > 3.3*  --   --   --   < > 1.4*  --  2.3*  --  14.0*  --  13.7*  --   --   --  13.3*  --   PLT 261  --  169  < > 163  --   --   --   < > 84*  --  61*  --  53*  --  53*  --   --   --  43*  --   NA 134*  --  140  < > 142  --   --  143  --  144  < > 151* 147*  --   --  148*  --   --   --  150*  --   K 3.5  --  3.8  < > 4.4  --   --  3.4*  --  2.8*  < > 3.2* 4.0  --   --  4.4  --   --   --  3.0*  --   CL 93*  --  114*  < > 120*  --   --  115*  --  116*  --  122* 120*  --   --  118*  --   --   --  115*  --   CO2 27  --  13*  < > 11*  --   --  15*  --  16*  --  18* 20  --   --  20  --   --   --  26  --   GLUCOSE 114*  --  112*  < > 166*  --   --  298*  --  150*  --  172* 87  --   --  211*  --   --   --  185*  --   BUN 15  --  23  < > 16  --   --  15  --  13  --  15 17  --   --  26*  --   --   --  26*  --   CREATININE 0.58  --  1.01  < > 0.75  --   --  0.70  --  0.64  --  0.52 0.55  --   --  0.85  --   --   --  0.68  --   CALCIUM 9.6  --  6.5*  < > 6.4*  --    --  6.8*  --  7.8*  --  7.0* 7.8*  --   --  7.6*  --   --   --  8.3*  --   MG  --   --   --   --  1.3*  --   --   --   --  2.3  --   --   --   --   --   --   --   --   --   --   --   PHOS  --   --   --   < > 2.2*  --   --   --   --  1.8*  --   --  1.5*  --   --  3.8  --   --   --   --   --   AST 19  --  22  < > 68*  --   --  88*  --   --   --   --   --   --   --  306*  --   --   --   --   --  ALT 18  --  12  < > 34  --   --  56*  --   --   --   --   --   --   --  167*  --   --   --   --   --   ALKPHOS 91  --  33*  < > 33*  --   --  47  --   --   --   --   --   --   --  61  --   --   --   --   --   BILITOT 1.0  --  0.7  < > 0.3  --   --  0.5  --   --   --   --   --   --   --  2.8*  --   --   --   --   --   PROT 7.5  --  4.1*  < > 4.1*  --   --  5.4*  --   --   --   --   --   --   --  4.8*  --   --   --   --   --   ALBUMIN 4.1  --  1.9*  < > 1.7*  --   --  2.8*  --   --   --   --   --   --   --  2.2*  --   --   --   --   --   APTT  --   --  34  --   --   --   --   --   --   --   --   --   --   --   --   --   --   --   --   --   --   INR  --   < > 3.33*  --   --   --   --  1.54*  --  1.77*  --  1.52*  --   --   --  2.01*  --   --   --  1.66*  --   LATICACIDVEN  --   < > 6.0*  --   --  3.8*  --  5.4*  --   --   --   --   --   --   --   --  2.4*  --   --   --   --   TROPONINI  --   --  <0.30  --   --   --   --   --   --   --   --   --   --   --   --   --   --   --   --   --   --   PROCALCITON  --   --  61.35  --  76.89  --   --   --   --  46.27  --   --   --   --   --   --   --   --   --   --   --   PHART  --   --   --   < >  --   --   < >  --   --   --   < >  --   --   --   < >  --   --  7.357 7.348*  --  7.603*  PCO2ART  --   --   --   < >  --   --   < >  --   --   --   < >  --   --   --   < >  --   --  32.5* 37.4  --  23.4*  PO2ART  --   --   --   < >  --   --   < >  --   --   --   < >  --   --   --   < >  --   --  116.0* 81.9  --  99.4  < > = values in this interval not displayed.  Recent  Labs Lab 01/05/13 1146 01/05/13 1528 01/05/13 1919 01/05/13 2335 01/06/13 0341  GLUCAP 120* 163* 134* 141* 146*    Imaging: Dg Chest Port 1 View  01/06/2013  *RADIOLOGY REPORT*  Clinical Data: Follow up ARDS  PORTABLE CHEST - 1 VIEW  Comparison: Prior chest x-ray 01/05/2013  Findings: Endotracheal tube is 2.3 cm above the carina.  Stable position of left subclavian approach single lumen portacatheter with catheter tip in the distal SVC.  A right IJ central venous catheter is also unchanged with the tip in the distal right SVC. Nasogastric tube is coiled within the stomach.  Improved inspiratory volumes and improved aeration throughout. There are mild persistent bilateral interstitial opacities.  Small right pleural effusion.  No pneumothorax.  Cardiac and mediastinal contours are unchanged.  No acute osseous abnormality.  IMPRESSION:  1.  Improved inspiratory volumes and improved aeration throughout all lung fields. 2.  Mild diffuse residual predominately interstitial opacities. 3.  Small right pleural effusion. 4.  Stable and satisfactory support apparatus as above.   Original Report Authenticated By: Malachy Moan, M.D.    Dg Chest Port 1 View  01/05/2013  *RADIOLOGY REPORT*  Clinical Data: Acute respiratory failure on ventilator.  PORTABLE CHEST - 1 VIEW  Comparison: 01/04/2013  Findings: Support apparatus remains in appropriate position.  Diffuse bilateral air space disease with bibasilar predominance shows no significant change.  Heart size remains stable.  IMPRESSION: No significant change in diffuse bilateral airspace disease.   Original Report Authenticated By: Myles Rosenthal, M.D.    Dg Chest Port 1 View  01/04/2013  *RADIOLOGY REPORT*  Clinical Data: Evaluate endotracheal tube.  PORTABLE CHEST - 1 VIEW  Comparison: 01/02/2013  Findings: Endotracheal tube remains near the carina. Endotracheal tube is roughly 9 mm above the carina.  Nasogastric tube is coiled in the gastric fundus.   Port-A-Cath tip in the lower SVC region. Right jugular central venous catheter in the SVC region.  There are increased patchy densities in both lungs.  No evidence for a pneumothorax.  Heart size is within normal limits.  IMPRESSION: Increased interstitial and airspace densities in both lungs. Findings could represent pulmonary edema or infection.  Support apparatuses as described.  The tip of the endotracheal tube is close to the carina. The endotracheal tube position was discussed with the patient's nursing staff on 01/04/2013 at 10:03 a.m.   Original Report Authenticated By: Richarda Overlie, M.D.      ASSESSMENT / PLAN:  PULMONARY A: Resp Failure in setting of SIRS, and probable aspiration pneumonia. 4/14 > improvement in inspiratory volumes and improved aeration throughout all lung volumes. Small right effusion. 7.603/23.4/99.4. Reduced the RR to 22.  P:   -vent support >>  4/14> changed to pressure control, abg noted with alk, reduce PC pver peep from 22 to 15, rate 16 repeat abg - see renal did wel with neg balance - Aeration improved after single dose of IV lasix 40 mg -maintain plat pressures less 30 -peep to 5 likely successful with pao2 -FACTT Goals  CARDIOVASCULAR A: Shock hypovolemic + septic, conts on norepi  Sinus tachy P:  Titrate pressors for  MAP: > 60 mm/Hg Check cvp q4, goal to 4 Cortisol 88, no steroids required, repeat as remain son pressors, r/o burn out adrenal gland  RENAL A:  Hypokalemic in setting of large NG output, hypomagnesemia and hypophosphatemia, good UOP. Hypokalemia of 3.0. Hypernatremia 150. Free water deficit 1.9L. P:   Daily BMET Cont to replace and monitor Replete K with IV KCl  10 mEq X 6 FACCT trials goals, reduce lasix to 20, await cvp d5w at 40 cc/hr   GASTROINTESTINAL A:  Small Bowel Obstrucition w/ complicated surgical bowel history. NG output ~800 mL in 24hr. Hx of colon CA, many sgy, chemo, XRT S/p exploration on 4/11 w/ lysis of adhesions   P:   -NGT to intermittent suction -Surgery following  -TPN and bowel rest per surgery -PPI  - Wound care consulted  HEMATOLOGIC A:  Baseline microcytic anemia. Pancytopenia.  Thrombocytopenia , likely sepsis related, assess for fibrinogen,  P:  -Monitor/Supportive Care -hold lovenox, only received one dose, may need HITT sent -SCD - INR daily -fibrnogen  INFECTIOUS A:  Sepsis, presumed source intra-abd. Leukopenia, Tmax 101.7. PCT 76.8>>46 4/11. Lactic acid downtrending 6.0>>3.8 4/10 4/12 >temp tr down, wbc improved 1.2>2.7  No perforation  P:   Meropenem (4/9) >>> Cultures pending Micafungin 4/13 >>no perf, may dc in future If spikes would consider linazolid, (plat low however) Repeat BC sent  ENDOCRINE A:  No Acute Issue   P:   -SSI Repeat cortisol  NEUROLOGIC A:  Pt baseline anxiety disorder and acute Encephalopathy - in setting of shock P:   Sedation protocol fent Dc versed drip Add int versed WUA  Signed:  Dow Adolph, MD PGY-1 Internal Medicine Teaching Service Pager: [phone] 01/06/2013, 8:21 AM   Ccm time 35 min   I have fully examined this patient and agree with above findings.    And edited in full  Mcarthur Rossetti. Tyson Alias, MD, FACP Pgr: [phone] [facility]

## 2013-01-06 NOTE — Consult Note (Signed)
WOC ostomy consult  Stoma type/location: Ileostomy revision surgery performed 4/13.  Stoma to right lower quad.  Full thickness wound followed by CCS team to midline abd 15X1.5X1cm, 100% beefy red, minimal pink drainage, no odor. Damp fluffed gauze dressing intact to wound. Stomal assessment/size: 114 inches, red and viable, flush with skin level with crease located at 9 o'clock Peristomal assessment: Skin intact surrounding. Output 50cc liquid green stool Ostomy pouching: 1pc. With barrier ring Education provided: Applied one piece flexible pouch with barrier ring to maintain seal.  Pt on vent and not able to participate in session.  Supplies ordered to bedside for staff use. Renee Mcgee MSN, RN, CWOCN, [place], CNS [phone]

## 2013-01-06 NOTE — Progress Notes (Signed)
Patient ID: Renee Nicholson, female   DOB: May 03, 1966, 47 y.o.   MRN: [id number] 3 Days Post-Op  Subjective: Much more stable overnight.  Fever curve better.    Objective: Vital signs in last 24 hours: Temp:  [98.6 F (37 C)-99.8 F (37.7 C)] 99.7 F (37.6 C) (04/14 0800) Pulse Rate:  [88-114] 104 (04/14 0744) Resp:  [20-41] 20 (04/14 0645) BP: (77-153)/(52-106) 113/80 mmHg (04/14 0744) SpO2:  [76 %-100 %] 100 % (04/14 0645) FiO2 (%):  [39.3 %-99.1 %] 40 % (04/14 0745) Last BM Date: 01/01/13  Intake/Output from previous day: 04/13 0701 - 04/14 0700 In: 1934 [I.V.:824; NG/GT:40; IV Piggyback:150; TPN:920] Out: 5520 [Urine:4910; Emesis/NG output:150; Stool:460] Intake/Output this shift:    General appearance: sedated Resp: coarse BS bilat Cardio: regular rate and rhythm, S1, S2 normal, no murmur, click, rub or gallop GI: s/nd/decreased BS/wound c/d/i/ostomy patent, pink  Lab Results:   Recent Labs  01/05/13 0500 01/06/13 0430  WBC 13.7* 13.3*  HGB 11.7* 11.8*  HCT 32.7* 31.9*  PLT 53* 43*   BMET  Recent Labs  01/05/13 0500 01/06/13 0430  NA 148* 150*  K 4.4 3.0*  CL 118* 115*  CO2 20 26  GLUCOSE 211* 185*  BUN 26* 26*  CREATININE 0.85 0.68  CALCIUM 7.6* 8.3*   PT/INR  Recent Labs  01/05/13 0500 01/06/13 0430  LABPROT 22.0* 19.1*  INR 2.01* 1.66*   ABG  Recent Labs  01/05/13 1240 01/06/13 0537  PHART 7.348* 7.603*  HCO3 19.9* 23.3    Studies/Results: Dg Chest Port 1 View  01/06/2013  *RADIOLOGY REPORT*  Clinical Data: Follow up ARDS  PORTABLE CHEST - 1 VIEW  Comparison: Prior chest x-ray 01/05/2013  Findings: Endotracheal tube is 2.3 cm above the carina.  Stable position of left subclavian approach single lumen portacatheter with catheter tip in the distal SVC.  A right IJ central venous catheter is also unchanged with the tip in the distal right SVC. Nasogastric tube is coiled within the stomach.  Improved inspiratory volumes and improved aeration  throughout. There are mild persistent bilateral interstitial opacities.  Small right pleural effusion.  No pneumothorax.  Cardiac and mediastinal contours are unchanged.  No acute osseous abnormality.  IMPRESSION:  1.  Improved inspiratory volumes and improved aeration throughout all lung fields. 2.  Mild diffuse residual predominately interstitial opacities. 3.  Small right pleural effusion. 4.  Stable and satisfactory support apparatus as above.   Original Report Authenticated By: Malachy Moan, M.D.    Dg Chest Port 1 View  01/05/2013  *RADIOLOGY REPORT*  Clinical Data: Acute respiratory failure on ventilator.  PORTABLE CHEST - 1 VIEW  Comparison: 01/04/2013  Findings: Support apparatus remains in appropriate position.  Diffuse bilateral air space disease with bibasilar predominance shows no significant change.  Heart size remains stable.  IMPRESSION: No significant change in diffuse bilateral airspace disease.   Original Report Authenticated By: Myles Rosenthal, M.D.    Dg Chest Port 1 View  01/04/2013  *RADIOLOGY REPORT*  Clinical Data: Evaluate endotracheal tube.  PORTABLE CHEST - 1 VIEW  Comparison: 01/02/2013  Findings: Endotracheal tube remains near the carina. Endotracheal tube is roughly 9 mm above the carina.  Nasogastric tube is coiled in the gastric fundus.  Port-A-Cath tip in the lower SVC region. Right jugular central venous catheter in the SVC region.  There are increased patchy densities in both lungs.  No evidence for a pneumothorax.  Heart size is within normal limits.  IMPRESSION: Increased interstitial and  airspace densities in both lungs. Findings could represent pulmonary edema or infection.  Support apparatuses as described.  The tip of the endotracheal tube is close to the carina. The endotracheal tube position was discussed with the patient's nursing staff on 01/04/2013 at 10:03 a.m.   Original Report Authenticated By: Richarda Overlie, M.D.     Anti-infectives: Anti-infectives   Start      Dose/Rate Route Frequency Ordered Stop   01/06/13 0000  vancomycin (VANCOCIN) IVPB 1000 mg/200 mL premix     1,000 mg 200 mL/hr over 60 Minutes Intravenous Every 24 hours 01/05/13 0106     01/05/13 1000  micafungin (MYCAMINE) 100 mg in sodium chloride 0.9 % 100 mL IVPB     100 mg 100 mL/hr over 1 Hours Intravenous Daily 01/05/13 0030     01/05/13 0115  vancomycin (VANCOCIN) IVPB 1000 mg/200 mL premix     1,000 mg 200 mL/hr over 60 Minutes Intravenous NOW 01/05/13 0106 01/05/13 0221   01/02/13 0100  meropenem (MERREM) 1 g in sodium chloride 0.9 % 100 mL IVPB     1 g 200 mL/hr over 30 Minutes Intravenous 2 times daily 01/01/13 2354     01/01/13 1600  meropenem (MERREM) 1 g in sodium chloride 0.9 % 100 mL IVPB     1 g 200 mL/hr over 30 Minutes Intravenous STAT 01/01/13 1535 01/01/13 1640      Assessment/Plan: s/p Procedure(s): EXPLORATORY LAPAROTOMY WITH LYSIS OF ADHESIONS, revision of ileostomy (N/A) Continue abx Critical care per CCM, wean vent. Consider trickle tube feeds if no plans to extubate today.   Continue TPN and bowel rest    LOS: 6 days    [facility] 01/06/2013

## 2013-01-07 ENCOUNTER — Inpatient Hospital Stay (HOSPITAL_COMMUNITY): Payer: BC Managed Care – PPO

## 2013-01-07 ENCOUNTER — Encounter (HOSPITAL_COMMUNITY): Payer: Self-pay | Admitting: General Surgery

## 2013-01-07 ENCOUNTER — Inpatient Hospital Stay (HOSPITAL_COMMUNITY): Admission: RE | Admit: 2013-01-07 | Payer: BC Managed Care – PPO | Source: Ambulatory Visit

## 2013-01-07 LAB — BLOOD GAS, ARTERIAL
Bicarbonate: 28.4 mEq/L — ABNORMAL HIGH (ref 20.0–24.0)
Drawn by: 31101
FIO2: 0.4 %
PEEP: 8 cmH2O
RATE: 16 resp/min
TCO2: 28.5 mmol/L (ref 0–100)
TCO2: 29.7 mmol/L (ref 0–100)
pCO2 arterial: 36.2 mmHg (ref 35.0–45.0)
pCO2 arterial: 41.2 mmHg (ref 35.0–45.0)
pH, Arterial: 7.491 — ABNORMAL HIGH (ref 7.350–7.450)
pH, Arterial: 7.453 — ABNORMAL HIGH (ref 7.350–7.450)

## 2013-01-07 LAB — CBC WITH DIFFERENTIAL/PLATELET
Basophils Absolute: 0 10*3/uL (ref 0.0–0.1)
Lymphocytes Relative: 6 % — ABNORMAL LOW (ref 12–46)
Monocytes Relative: 1 % — ABNORMAL LOW (ref 3–12)
Neutrophils Relative %: 91 % — ABNORMAL HIGH (ref 43–77)
Platelets: 40 10*3/uL — ABNORMAL LOW (ref 150–400)
RDW: 16.2 % — ABNORMAL HIGH (ref 11.5–15.5)
WBC: 15.7 10*3/uL — ABNORMAL HIGH (ref 4.0–10.5)

## 2013-01-07 LAB — PROTIME-INR
INR: 1.41 (ref 0.00–1.49)
Prothrombin Time: 16.9 seconds — ABNORMAL HIGH (ref 11.6–15.2)

## 2013-01-07 LAB — BASIC METABOLIC PANEL
BUN: 22 mg/dL (ref 6–23)
GFR calc Af Amer: >90 mL/min (ref >90)
GFR calc non Af Amer: >90 mL/min (ref >90)
Potassium: 3.3 mEq/L — ABNORMAL LOW (ref 3.5–5.1)

## 2013-01-07 LAB — POCT I-STAT 3, ART BLOOD GAS (G3+)
Acid-Base Excess: 4 mmol/L — ABNORMAL HIGH (ref 0.0–2.0)
Bicarbonate: 28.7 mEq/L — ABNORMAL HIGH (ref 20.0–24.0)
Patient temperature: 98.6
TCO2: 30 mmol/L (ref 0–100)
pH, Arterial: 7.423 (ref 7.350–7.450)

## 2013-01-07 LAB — CULTURE, BLOOD (ROUTINE X 2): Culture: NO GROWTH

## 2013-01-07 LAB — GLUCOSE, CAPILLARY
Glucose-Capillary: 178 mg/dL — ABNORMAL HIGH (ref 70–99)
Glucose-Capillary: 178 mg/dL — ABNORMAL HIGH (ref 70–99)
Glucose-Capillary: 208 mg/dL — ABNORMAL HIGH (ref 70–99)

## 2013-01-07 LAB — MAGNESIUM: Magnesium: 1.8 mg/dL (ref 1.5–2.5)

## 2013-01-07 MED ORDER — MAGNESIUM SULFATE 40 MG/ML IJ SOLN
2.0000 g | Freq: Once | INTRAMUSCULAR | Status: AC
  Administered 2013-01-07: 2 g via INTRAVENOUS
  Filled 2013-01-07: qty 50

## 2013-01-07 MED ORDER — INSULIN ASPART 100 UNIT/ML ~~LOC~~ SOLN
0.0000 [IU] | SUBCUTANEOUS | Status: DC
  Administered 2013-01-07: 5 [IU] via SUBCUTANEOUS
  Administered 2013-01-08 (×4): 3 [IU] via SUBCUTANEOUS
  Administered 2013-01-08: 8 [IU] via SUBCUTANEOUS
  Administered 2013-01-08: 5 [IU] via SUBCUTANEOUS
  Administered 2013-01-09 (×2): 2 [IU] via SUBCUTANEOUS
  Administered 2013-01-09 – 2013-01-11 (×8): 3 [IU] via SUBCUTANEOUS
  Administered 2013-01-11 (×2): 2 [IU] via SUBCUTANEOUS
  Administered 2013-01-11 – 2013-01-12 (×3): 3 [IU] via SUBCUTANEOUS
  Administered 2013-01-12 (×3): 2 [IU] via SUBCUTANEOUS
  Administered 2013-01-12: 1 [IU] via SUBCUTANEOUS
  Administered 2013-01-12 – 2013-01-13 (×2): 2 [IU] via SUBCUTANEOUS
  Administered 2013-01-13: 3 [IU] via SUBCUTANEOUS
  Administered 2013-01-13 – 2013-01-15 (×8): 2 [IU] via SUBCUTANEOUS
  Administered 2013-01-15: 3 [IU] via SUBCUTANEOUS
  Administered 2013-01-16 – 2013-01-17 (×5): 2 [IU] via SUBCUTANEOUS

## 2013-01-07 MED ORDER — CLINIMIX E/DEXTROSE (5/20) 5 % IV SOLN
INTRAVENOUS | Status: AC
  Administered 2013-01-07: 18:00:00 via INTRAVENOUS
  Filled 2013-01-07: qty 1000

## 2013-01-07 MED ORDER — OSMOLITE 1.2 CAL PO LIQD
1000.0000 mL | ORAL | Status: DC
  Administered 2013-01-07 – 2013-01-08 (×2): 1000 mL
  Filled 2013-01-07 (×3): qty 1000

## 2013-01-07 MED ORDER — SODIUM CHLORIDE 0.9 % IV SOLN
25.0000 ug/h | INTRAVENOUS | Status: DC
  Administered 2013-01-08 (×2): 200 ug/h via INTRAVENOUS
  Administered 2013-01-08: 300 ug/h via INTRAVENOUS
  Administered 2013-01-09: 100 ug/h via INTRAVENOUS
  Administered 2013-01-09: 400 ug/h via INTRAVENOUS
  Administered 2013-01-09 (×2): 300 ug/h via INTRAVENOUS
  Administered 2013-01-10: 375 ug/h via INTRAVENOUS
  Administered 2013-01-10: 100 ug/h via INTRAVENOUS
  Filled 2013-01-07 (×9): qty 50

## 2013-01-07 MED ORDER — SODIUM CHLORIDE 0.9 % IV SOLN
1.0000 mg/h | INTRAVENOUS | Status: DC
  Administered 2013-01-07 – 2013-01-08 (×2): 2 mg/h via INTRAVENOUS
  Filled 2013-01-07 (×2): qty 10

## 2013-01-07 MED ORDER — MIDAZOLAM BOLUS VIA INFUSION
1.0000 mg | INTRAVENOUS | Status: DC | PRN
  Filled 2013-01-07: qty 2

## 2013-01-07 MED ORDER — HYDROCORTISONE SOD SUCCINATE 100 MG IJ SOLR
50.0000 mg | Freq: Four times a day (QID) | INTRAMUSCULAR | Status: DC
  Administered 2013-01-07 – 2013-01-11 (×16): 50 mg via INTRAVENOUS
  Filled 2013-01-07 (×21): qty 1

## 2013-01-07 MED ORDER — SODIUM CHLORIDE 0.9 % IV BOLUS (SEPSIS)
500.0000 mL | Freq: Once | INTRAVENOUS | Status: AC
  Administered 2013-01-07: 500 mL via INTRAVENOUS

## 2013-01-07 MED ORDER — ACETAMINOPHEN 160 MG/5ML PO SOLN: 650.0000 mg | Freq: Four times a day (QID) | ORAL | Status: DC | PRN

## 2013-01-07 MED ORDER — POTASSIUM CHLORIDE 10 MEQ/50ML IV SOLN
10.0000 meq | INTRAVENOUS | Status: AC
  Administered 2013-01-07 (×4): 10 meq via INTRAVENOUS
  Filled 2013-01-07: qty 150

## 2013-01-07 MED ORDER — DEXTROSE 5 % IV SOLN: INTRAVENOUS | Status: DC

## 2013-01-07 MED ORDER — FENTANYL BOLUS VIA INFUSION
25.0000 ug | Freq: Four times a day (QID) | INTRAVENOUS | Status: DC | PRN
  Filled 2013-01-07: qty 100

## 2013-01-07 MED ORDER — INSULIN ASPART 100 UNIT/ML ~~LOC~~ SOLN
0.0000 [IU] | Freq: Four times a day (QID) | SUBCUTANEOUS | Status: DC
  Administered 2013-01-07: 1 [IU] via SUBCUTANEOUS

## 2013-01-07 MED ORDER — ADULT MULTIVITAMIN LIQUID CH
5.0000 mL | Freq: Every day | ORAL | Status: DC
  Administered 2013-01-07 – 2013-01-17 (×11): 5 mL via ORAL
  Filled 2013-01-07 (×11): qty 5

## 2013-01-07 MED ORDER — MAGNESIUM SULFATE 40 MG/ML IJ SOLN: 2.0000 g | Freq: Once | INTRAMUSCULAR | Status: DC

## 2013-01-07 NOTE — Progress Notes (Addendum)
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 Presented 4/8 with a 24 hour history of abdomnial pain, N/V, and minimal ostomy output. She was admitted for SBO. 4/9 she became tachycardiac and hypotensive. PCCM has been asked to see 4/9  SIGNIFICANT EVENTS / STUDIES:  4/8 - Abd XRay - Small Bowel Obstruction. No peritoneal air.  4/9 - CT abd/pelvis >>> ?LLL consolidation, SBO and adhesions, no free air 4/10 - ETT for tachypnea 4/11- OR lysis of adhesions after being febrile 4/12- Tm 103, repeat cx, add vancomycin/micafungin 4/13- Change to pressure control, lasix x 1 dose 4/16 - Enteral feeding    LINES / TUBES: CVL 4/9 >>> NGT 4/9 >>> RIJ 4/9 >>> ETT 4/10 >>>  CULTURES: BC x 2 4/9 >>> Negative  Urine 4/9 >>> Negative  Repeat BC 4/13 >> NGTD  ANTIBIOTICS:  Meropenem 4/9 >>> Vancomycin 4/13 >> Micafungin 4/13 >>  SUBJECTIVE:  Remains on pressors and increased FiO2/PEEP. Afebrile.   VITAL SIGNS: Temp:  [98.3 F (36.8 C)-101.5 F (38.6 C)] 98.3 F (36.8 C) (04/15 0336) Pulse Rate:  [80-106] 103 (04/15 0813) Resp:  [16-34] 32 (04/15 0813) BP: (46-165)/(30-105) 100/56 mmHg (04/15 0813) SpO2:  [90 %-100 %] 90 % (04/15 0813) FiO2 (%):  [38.9 %-40.9 %] 39.9 % (04/15 0813) HEMODYNAMICS: CVP:  [3 mmHg-5 mmHg] 3 mmHg VENTILATOR SETTINGS: Vent Mode:  [-] PCV FiO2 (%):  [38.9 %-40.9 %] 39.9 % Set Rate:  [16 bmp] 16 bmp PEEP:  [5 cmH20-8 cmH20] 5 cmH20 Plateau Pressure:  [22 cmH20-23 cmH20] 22 cmH20 INTAKE / OUTPUT: Intake/Output     04/14 0701 - 04/15 0700 04/15 0701 - 04/16 0700   I.V. (mL/kg) 1578.9 (35.3)    NG/GT  0    IV Piggyback 1447    TPN 1083.8    Total Intake(mL/kg) 4109.6 (91.9)    Urine (mL/kg/hr) 4425 (4.1)    Emesis/NG output 100 (0.1)    Stool 300 (0.3)    Total Output 4825     Net -715.4            PHYSICAL EXAMINATION: General:  Petite female, acutely ill in appearance.  Neuro:  sedated HEENT: ETT, no JVD Cardiovascular:  tachy no m/r/g Lungs: upper respiratory noise.  Abdomen:  NGT, colostom , surgical dsg intact and clean. Colostomy bag has greenish appearing contents about 100cc  Musculoskeletal:  Intact Skin:  Intact  LABS:  Recent Labs Lab 12/31/12 1225  01/01/13 1530  01/02/13 0500 01/02/13 0600  01/02/13 1130  01/03/13 0455  01/05/13 0500 01/05/13 0600  01/06/13 0430  01/06/13 1150 01/06/13 1600 01/06/13 2025 01/07/13 0108 01/07/13 0411 01/07/13 0500 01/07/13 0515  HGB 13.2  --  10.8*  < > 11.2*  --   --   --   < > 7.8*  < > 11.7*  --   --  11.8*  --   --   --   --   --   --   --  11.6*  WBC 12.7*  --  2.0*  < > 3.3*  --   --   --   < > 1.4*  < > 13.7*  --   --  13.3*  --   --   --   --   --   --   --  15.7*  PLT 261  --  169  < > 163  --   --   --   < > 84*  < > 53*  --   --  43*  --   --   --   --   --   --   --  40*  NA 134*  --  140  < > 142  --   --  143  --  144  < > 148*  --   --  150*  --   --  147* 151*  --   --  148*  --   K 3.5  --  3.8  < > 4.4  --   --  3.4*  --  2.8*  < > 4.4  --   --  3.0*  --   --  3.5 3.3*  --   --  3.3*  --   CL 93*  --  114*  < > 120*  --   --  115*  --  116*  < > 118*  --   --  115*  --   --  112 113*  --   --  110  --   CO2 27  --  13*  < > 11*  --   --  15*  --  16*  < > 20  --   --  26  --   --  28 30  --   --  29  --   GLUCOSE 114*  --  112*  < > 166*  --   --  298*  --  150*  < > 211*  --   --  185*  --   --  246* 194*  --   --  203*  --   BUN 15  --  23  < > 16  --   --  15  --  13  < > 26*  --   --  26*  --   --  29* 28*  --   --  22  --   CREATININE 0.58  --  1.01  < > 0.75  --   --  0.70  --  0.64  < > 0.85   --   --  0.68  --   --  0.74 0.68  --   --  0.55  --   CALCIUM 9.6  --  6.5*  < > 6.4*  --   --  6.8*  --  7.8*  < > 7.6*  --   --  8.3*  --   --  8.3* 8.3*  --   --  8.1*  --   MG  --   --   --   < > 1.3*  --   --   --   --  2.3  --   --   --   --   --   --   --   --  1.5  --   --  1.8  --   PHOS  --   --   --   < > 2.2*  --   --   --   --  1.8*  < > 3.8  --   --   --   --   --   --  2.1*  --   --  2.4  --   AST 19  --  22  < > 68*  --   --  88*  --   --   --  306*  --   --   --   --   --   --   --   --   --   --   --   ALT 18  --  12  < > 34  --   --  56*  --   --   --  167*  --   --   --   --   --   --   --   --   --   --   --   ALKPHOS 91  --  33*  < > 33*  --   --  47  --   --   --  61  --   --   --   --   --   --   --   --   --   --   --   BILITOT 1.0  --  0.7  < > 0.3  --   --  0.5  --   --   --  2.8*  --   --   --   --   --   --   --   --   --   --   --   PROT 7.5  --  4.1*  < > 4.1*  --   --  5.4*  --   --   --  4.8*  --   --   --   --   --   --   --   --   --   --   --   ALBUMIN 4.1  --  1.9*  < > 1.7*  --   --  2.8*  --   --   --  2.2*  --   --   --   --   --   --   --   --   --   --   --   APTT  --   --  34  --   --   --   --   --   --   --   --   --   --   --   --   --   --   --   --   --   --   --   --   INR  --   < > 3.33*  --   --   --   --  1.54*  --  1.77*  < > 2.01*  --   --  1.66*  --   --   --   --   --   --  1.41  --   LATICACIDVEN  --   < > 6.0*  --   --  3.8*  --  5.4*  --   --   --   --  2.4*  --   --   --   --   --   --   --   --   --   --   TROPONINI  --   --  <0.30  --   --   --   --   --   --   --   --   --   --   --   --   --   --   --   --   --   --   --   --  PROCALCITON  --   --  61.35  --  76.89  --   --   --   --  46.27  --   --   --   --   --   --   --   --   --   --   --   --   --   PHART  --   --   --   < >  --   --   < >  --   --   --   < >  --   --   < >  --   < > 7.511*  --   --  7.491* 7.453*  --   --   PCO2ART  --   --   --   < >  --   --   < >  --   --    --   < >  --   --   < >  --   < > 31.9*  --   --  36.2 41.2  --   --   PO2ART  --   --   --   < >  --   --   < >  --   --   --   < >  --   --   < >  --   < > 118.0*  --   --  114.0* 78.8*  --   --   < > = values in this interval not displayed.  Recent Labs Lab 01/06/13 0759 01/06/13 1137 01/06/13 1501 01/06/13 1917 01/07/13 0337  GLUCAP 147* 143* 150* 170* 178*    Imaging: Dg Chest Port 1 View  01/06/2013  *RADIOLOGY REPORT*  Clinical Data: Follow up ARDS  PORTABLE CHEST - 1 VIEW  Comparison: Prior chest x-ray 01/05/2013  Findings: Endotracheal tube is 2.3 cm above the carina.  Stable position of left subclavian approach single lumen portacatheter with catheter tip in the distal SVC.  A right IJ central venous catheter is also unchanged with the tip in the distal right SVC. Nasogastric tube is coiled within the stomach.  Improved inspiratory volumes and improved aeration throughout. There are mild persistent bilateral interstitial opacities.  Small right pleural effusion.  No pneumothorax.  Cardiac and mediastinal contours are unchanged.  No acute osseous abnormality.  IMPRESSION:  1.  Improved inspiratory volumes and improved aeration throughout all lung fields. 2.  Mild diffuse residual predominately interstitial opacities. 3.  Small right pleural effusion. 4.  Stable and satisfactory support apparatus as above.   Original Report Authenticated By: Malachy Moan, M.D.      ASSESSMENT / PLAN:  PULMONARY A: Resp Failure in setting of SIRS, and probable aspiration pneumonia. 4/14 > improvement in inspiratory volumes and improved aeration throughout all lung volumes. Small right effusion. 7.60/23.4/99.4. Reduced the RR to 22.  P:   -vent support >> 4/14> changed to pressure control,  Repeat ABG's improved alkalosis PH 7.54 - reduce PC to 5 , control rr elevation if ph greater 7.55, with fent - Aeration improved after single dose of IV lasix 40 mg, hold further, see cvs -maintain plat  pressures less 30 - may need to transition back to prvc -FACTT Goals dc, see cvs -would wean if rr 35-40  CARDIOVASCULAR A: Shock hypovolemic + septic, conts on norepi  Sinus tachy Meets criteria rel Ai now P:  Titrate pressors for  MAP: > 60  mm/Hg Check cvp now 2, remains on pressors, bolus and assess response Cortisol 88 >> 17.4, start stress steroids  RENAL A:  Hypokalemic in setting of large NG output, hypomagnesemia and hypophosphatemia, good UOP. Hypokalemia of 3.0 >3.8. Hypernatremia 150 > 148.  P:   Daily BMET Replete K with IV KCl, replace mag FACCT trials goals, dc, bolus Continue with d5w at 50 cc/hr   GASTROINTESTINAL A:  Small Bowel Obstrucition w/ complicated surgical bowel history. NG output ~800 mL in 24hr. Hx of colon CA, many sgy, chemo, XRT S/p exploration on 4/11 w/ lysis of adhesions  P:   -NGT to intermittent suction -Surgery following  -TPN and bowel rest per surgery -PPI  - Wound care consulted  HEMATOLOGIC A:  Baseline microcytic anemia. Pancytopenia.  Thrombocytopenia , likely sepsis related. Fibrinogen 706. P:  -Monitor/Supportive Care -continue to hold lovenox, only received one dose,  low clinical suspcion of HITT -SCD  INFECTIOUS A:  Sepsis, presumed source intra-abd. Leukopenia, Tmax 101.7. PCT 76.8>>46 4/11. Lactic acid downtrending 6.0>>3.8 4/10 4/12 >temp tr down, wbc improved 1.2>2.7  No perforation  P:   Meropenem (4/9) >>> Cultures pending Micafungin 4/13 >>no perf, may dc after some clinical progress Repeat BC - no growth Hope pressor needs are related to rel AI  ENDOCRINE A:  Rel AI P:   -SSI Repeat cortisol - 17.4  NEUROLOGIC A:  Pt baseline anxiety disorder and acute Encephalopathy - in setting of shock P:   Sedation protocol fent Continue with versed WUA  Signed:  Dow Adolph, MD PGY-1 Internal Medicine Teaching Service Pager: [phone] 01/07/2013, 8:26 AM   Ccm time independent 30 min  I have fully  examined this patient and agree with above findings.    And edite din full  Mcarthur Rossetti. Tyson Alias, MD, FACP Pgr: [phone] [facility]

## 2013-01-07 NOTE — Progress Notes (Signed)
NUTRITION FOLLOW UP  Intervention:    Initiate TF via OGT with Osmolite 1.2 at 10 ml/h, keep at this rate for now per Surgery note.  Once tolerance of trophic feedings established, increase by 10 ml every 4 hours to goal rate of 45 ml/h to provide 1296 kcals, 60 gm protein, 886 ml free water daily.  Continue TPN for now.   Total intake with TPN and trophic feedings will be 1318 kcals, 61 gm protein daily (~100% of estimated needs).  Nutrition Dx:   Inadequate oral intake related to inability to eat as evidenced by NPO status.  Goal:   Intake to meet >90% of estimated nutrition needs, unmet.  Unable to meet nutrition goal due to limitations with premixed TPN solution and national lipid backorder.  Monitor:   TF tolerance/adequacy, weight trend, labs, vent status.  Assessment:   Patient remains intubated on ventilator support.  MV: 11.7 Temp:Temp (24hrs), Avg:98.9 F (37.2 C), Min:97.7 F (36.5 C), Max:101.5 F (38.6 C)  Received MD Consult for TF initiation and management.  Patient is receiving TPN with Clinimix E 5/20 @ 40 ml/hr.  Lipids (20% IVFE @ 9 ml/hr), multivitamins, and trace elements are provided 3 times weekly (MWF) due to national backorder.  Provides 1030 kcal and 48 grams protein daily (based on weekly average).  Meets 76% minimum estimated kcal and 96% minimum estimated protein needs.  Patient has shown signs of refeeding syndrome with shifts in electrolytes.  Potassium low today.  Magnesium and Phosphorus are WNL today.   Height: Ht Readings from Last 1 Encounters:  01/04/13 4\' 4"  (1.321 m)    Weight Status:   Wt Readings from Last 1 Encounters:  01/03/13 98 lb 8.7 oz (44.7 kg)  01/01/13  81 lb (36.741 kg) 12/31/12  79 lb (35.834 kg)  12/25/12 72 lb 3.2 oz (32.75 kg)  Weight trending up with fluid resuscitation.  Re-estimated needs:  Kcal: 1360 Protein: 50-65 gm Fluid: 1.3-1.4 L  Skin: surgical abdominal incisions  Diet Order:  NPO   Intake/Output Summary (Last 24 hours) at 01/07/13 1243 Last data filed at 01/07/13 0900  Gross per 24 hour  Intake 3855.65 ml  Output   4535 ml  Net -679.35 ml    Last BM: 4/14 (ileostomy output is bilious)   Labs:   Recent Labs Lab 01/03/13 0455  01/05/13 0500  01/06/13 1600 01/06/13 2025 01/07/13 0500  NA 144  < > 148*  < > 147* 151* 148*  K 2.8*  < > 4.4  < > 3.5 3.3* 3.3*  CL 116*  < > 118*  < > 112 113* 110  CO2 16*  < > 20  < > 28 30 29   BUN 13  < > 26*  < > 29* 28* 22  CREATININE 0.64  < > 0.85  < > 0.74 0.68 0.55  CALCIUM 7.8*  < > 7.6*  < > 8.3* 8.3* 8.1*  MG 2.3  --   --   --   --  1.5 1.8  PHOS 1.8*  < > 3.8  --   --  2.1* 2.4  GLUCOSE 150*  < > 211*  < > 246* 194* 203*  < > = values in this interval not displayed.  CBG (last 3)   Recent Labs  01/07/13 0337 01/07/13 0757 01/07/13 1224  GLUCAP 178* 197* 139*    Scheduled Meds: . antiseptic oral rinse  15 mL Mouth Rinse q12n4p  . chlorhexidine  15 mL Mouth/Throat  BID  . feeding supplement (OSMOLITE 1.2 CAL)  1,000 mL Per Tube Q24H  . hydrocortisone sod succinate (SOLU-CORTEF) injection  50 mg Intravenous Q6H  . insulin aspart  0-15 Units Subcutaneous Q6H  . meropenem (MERREM) IV  1 g Intravenous BID  . micafungin (MYCAMINE) IV  100 mg Intravenous Daily  . multivitamin  5 mL Oral Daily  . pantoprazole (PROTONIX) IV  40 mg Intravenous Q12H  . sodium chloride  500 mL Intravenous Once  . sodium chloride  10-40 mL Intracatheter Q12H  . vancomycin  1,000 mg Intravenous Q24H    Continuous Infusions: . Marland KitchenTPN (CLINIMIX-E) Adult    . dextrose 75 mL/hr at 01/07/13 0700  . dextrose 50 mL/hr at 01/07/13 1130  . Marland KitchenTPN (CLINIMIX-E) Adult 40 mL/hr at 01/06/13 1715   And  . fat emulsion 216 mL (01/06/13 1715)  . fentaNYL infusion INTRAVENOUS 100 mcg/hr (01/06/13 2301)  . norepinephrine (LEVOPHED) Adult infusion 6 mcg/min (01/07/13 0700)    Joaquin Courts, RD, LDN, CNSC Pager [phone] After Hours  Pager [phone]

## 2013-01-07 NOTE — Progress Notes (Signed)
Patient ID: Renee Nicholson, female   DOB: 10/01/1965, 47 y.o.   MRN: [id number] 4 Days Post-Op  Subjective: PEEP weaned a bit.  Remains stable.    Objective: Vital signs in last 24 hours: Temp:  [98 F (36.7 C)-101.5 F (38.6 C)] 98 F (36.7 C) (04/15 0834) Pulse Rate:  [80-106] 103 (04/15 0813) Resp:  [16-34] 32 (04/15 0813) BP: (52-156)/(32-105) 100/56 mmHg (04/15 0813) SpO2:  [90 %-100 %] 90 % (04/15 0813) FiO2 (%):  [38.9 %-40.9 %] 40 % (04/15 0813) Last BM Date: 01/06/13  Intake/Output from previous day: 04/14 0701 - 04/15 0700 In: 4109.6 [I.V.:1578.9; IV Piggyback:1447; TPN:1083.8] Out: 4825 [Urine:4425; Emesis/NG output:100; Stool:300] Intake/Output this shift:    General appearance: sedated Resp: coarse BS bilat Cardio: regular rate and rhythm, S1, S2 normal, no murmur, click, rub or gallop GI: s/nd/wound c/d/i/ostomy patent, pink.  Bilious output and gas in bag.    Lab Results:   Recent Labs  01/06/13 0430 01/07/13 0515  WBC 13.3* 15.7*  HGB 11.8* 11.6*  HCT 31.9* 32.5*  PLT 43* 40*   BMET  Recent Labs  01/06/13 2025 01/07/13 0500  NA 151* 148*  K 3.3* 3.3*  CL 113* 110  CO2 30 29  GLUCOSE 194* 203*  BUN 28* 22  CREATININE 0.68 0.55  CALCIUM 8.3* 8.1*   PT/INR  Recent Labs  01/06/13 0430 01/07/13 0500  LABPROT 19.1* 16.9*  INR 1.66* 1.41   ABG  Recent Labs  01/07/13 0108 01/07/13 0411  PHART 7.491* 7.453*  HCO3 27.4* 28.4*    Studies/Results: Dg Chest Port 1 View  01/06/2013  *RADIOLOGY REPORT*  Clinical Data: Follow up ARDS  PORTABLE CHEST - 1 VIEW  Comparison: Prior chest x-ray 01/05/2013  Findings: Endotracheal tube is 2.3 cm above the carina.  Stable position of left subclavian approach single lumen portacatheter with catheter tip in the distal SVC.  A right IJ central venous catheter is also unchanged with the tip in the distal right SVC. Nasogastric tube is coiled within the stomach.  Improved inspiratory volumes and improved  aeration throughout. There are mild persistent bilateral interstitial opacities.  Small right pleural effusion.  No pneumothorax.  Cardiac and mediastinal contours are unchanged.  No acute osseous abnormality.  IMPRESSION:  1.  Improved inspiratory volumes and improved aeration throughout all lung fields. 2.  Mild diffuse residual predominately interstitial opacities. 3.  Small right pleural effusion. 4.  Stable and satisfactory support apparatus as above.   Original Report Authenticated By: Malachy Moan, M.D.     Anti-infectives: Anti-infectives   Start     Dose/Rate Route Frequency Ordered Stop   01/06/13 0000  vancomycin (VANCOCIN) IVPB 1000 mg/200 mL premix     1,000 mg 200 mL/hr over 60 Minutes Intravenous Every 24 hours 01/05/13 0106     01/05/13 1000  micafungin (MYCAMINE) 100 mg in sodium chloride 0.9 % 100 mL IVPB     100 mg 100 mL/hr over 1 Hours Intravenous Daily 01/05/13 0030     01/05/13 0115  vancomycin (VANCOCIN) IVPB 1000 mg/200 mL premix     1,000 mg 200 mL/hr over 60 Minutes Intravenous NOW 01/05/13 0106 01/05/13 0221   01/02/13 0100  meropenem (MERREM) 1 g in sodium chloride 0.9 % 100 mL IVPB     1 g 200 mL/hr over 30 Minutes Intravenous 2 times daily 01/01/13 2354     01/01/13 1600  meropenem (MERREM) 1 g in sodium chloride 0.9 % 100 mL IVPB  1 g 200 mL/hr over 30 Minutes Intravenous STAT 01/01/13 1535 01/01/13 1640      Assessment/Plan: s/p Procedure(s): EXPLORATORY LAPAROTOMY WITH LYSIS OF ADHESIONS, revision of ileostomy (N/A) Continue abx Critical care per CCM, wean vent. Trickle tube feeds if no plans to extubate today (47ml/hr, do not advance) Continue TPN     LOS: 7 days    [facility] 01/07/2013

## 2013-01-07 NOTE — Procedures (Signed)
Name: Valleri Hendricksen MRN: [id number] DOB: 04-Oct-1965  PROCEDURE NOTE  Procedure:  Endotracheal intubation.  Indication:  Acute respiratory failure  Consent:  Consent was implied due to the emergency nature of the procedure.  Anesthesia:  A total of 10 mg of Etomidate was given intravenously.  Procedure summary:  Appropriate equipment was assembled. The patient was identified as Allen Derry and safety timeout was performed. The patient was placed supine, with head in sniffing position. After adequate level of anesthesia was achieved, a GS#3 blade was inserted into the oropharynx and the vocal cords were visualized. A 7.5 endotracheal tube was inserted without difficulty and visualized going through the vocal cords. The stylette was removed and cuff inflated. Colorimetric change was noted on the CO2 meter. Breath sounds were heard over both lung fields equally. Post procedure chest xray was ordered.  Complications:  No immediate complications were noted.  Hemodynamic parameters and oxygenation remained stable throughout the procedure.    Orlean Bradford, M.D. Pulmonary and Critical Care Medicine [facility] Cell: [phone] Pager: [phone]  01/07/2013, 7:33 PM

## 2013-01-07 NOTE — Progress Notes (Signed)
Pt. Self extubated herself. ED physician re-intubated pt. 

## 2013-01-07 NOTE — Progress Notes (Signed)
Remains in respiratory alkalosis   Decrease PC to 10/5; ABG in am.

## 2013-01-07 NOTE — Progress Notes (Signed)
eLink Physician-Brief Progress Note Patient Name: Renee Nicholson DOB: 1966-08-31 MRN: [id number]  Date of Service  01/07/2013   HPI/Events of Note   Pt self extubated with agitation despite mittens  eICU Interventions  Dr Rocky Link Z reintubated at bedside stat. Pt immediately desat to 50% off vent.  Will order continuous sedation for now with RASS -1 to -2 for tonite   Intervention Category Major Interventions: Delirium, psychosis, severe agitation - evaluation and management;Respiratory failure - evaluation and management  Shan Levans 01/07/2013, 7:34 PM

## 2013-01-07 NOTE — Progress Notes (Signed)
PARENTERAL NUTRITION CONSULT NOTE - Follow Up  Pharmacy Consult for TPN Indication: SBO  No Known Allergies  Patient Measurements: Height: 4\' 4"  (132.1 cm) Weight: 98 lb 8.7 oz (44.7 kg) IBW/kg (Calculated) : 27.1 Usual Weight: 95 lb  Vital Signs: Temp: 98.3 F (36.8 C) (04/15 0336) Temp src: Oral (04/15 0336) BP: 127/87 mmHg (04/15 0600) Pulse Rate: 88 (04/15 0600) Intake/Output from previous day: 04/14 0701 - 04/15 0700 In: 3970 [I.V.:1488.3; IV Piggyback:1447; TPN:1034.8] Out: 4525 [Urine:4225; Emesis/NG output:100; Stool:200]  Labs:  Recent Labs  01/05/13 0500 01/06/13 0430 01/07/13 0500 01/07/13 0515  WBC 13.7* 13.3*  --  15.7*  HGB 11.7* 11.8*  --  11.6*  HCT 32.7* 31.9*  --  32.5*  PLT 53* 43*  --  40*  INR 2.01* 1.66* 1.41  --      Recent Labs  01/05/13 0500  01/06/13 1600 01/06/13 2025 01/07/13 0500  NA 148*  < > 147* 151* 148*  K 4.4  < > 3.5 3.3* 3.3*  CL 118*  < > 112 113* 110  CO2 20  < > 28 30 29   GLUCOSE 211*  < > 246* 194* 203*  BUN 26*  < > 29* 28* 22  CREATININE 0.85  < > 0.74 0.68 0.55  CALCIUM 7.6*  < > 8.3* 8.3* 8.1*  MG  --   --   --  1.5 1.8  PHOS 3.8  --   --  2.1* 2.4  PROT 4.8*  --   --   --   --   ALBUMIN 2.2*  --   --   --   --   AST 306*  --   --   --   --   ALT 167*  --   --   --   --   ALKPHOS 61  --   --   --   --   BILITOT 2.8*  --   --   --   --   < > = values in this interval not displayed. Estimated Creatinine Clearance: 46.8 ml/min (by C-G formula based on Cr of 0.55).    Recent Labs  01/06/13 1501 01/06/13 1917 01/07/13 0337  GLUCAP 150* 170* 178*         Insulin Requirements in the past 24 hours:  6 units sensitive SSI  Current Nutrition:  Clinimix E 5/20 infusing at 40 ml/hr (48gm Protein, avg of 1059 kCal with 3 x weekly lipid supplementation) Osmolite 1.2 at 10 ml/hr = 288 kCal and 13gm protein  Nutritional Goals:  1246 kCal, 50-60 grams of protein per day  Assessment:   Ms. 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  She also has a history of recurrent SBO.  Patient was admitted 4/8 (discharged 4/4) with the same diagoses, now with ileus s/p LOA and ileostomy revision.  Noted plan to start Osmolite 1.2 at 10 ml/hr if patient not extubated.  GI: Continues NPO, TPN not at goal d/t refeeding. Noted NGT output decreased, but ongoing.  Stool reported (increased) in ileostomy. Endo: no hx DM, CBGs trending up (on D5W) Lytes: Na elevated - on D5W at 75 ml/hr, K low - 4 runs KCL ordered, Mag WNL at 1.8 and 2gm ordered - in respiratory alkalosis Renal: SCr remains stable. UOP is excellent with Lasix administration. Pulm: possible pulm edema per MD notes, vent day #6, FiO2 40% Cards: MAP at goal with norepinephrine -  need decreased to 6 mcg/min, CVP 3 Hepatobil: LFTs, Tbili elevated from baseline (alk phos surprisingly WNL). Now on TPN x 6 days - could be d/t cholestasis. This can be alleviated with cyclic TPN or enteral feeding. She is not a candidate for the former as fluid loads will be high over a shorter period of time - not ideal given her pulmonary status. GI feeding may be feasible if NGT output continues to decline.  Neuro: fentanyl gtt, off versed, GCS 11, RASS -2 (goal -2) ID: abx for sepsis, presumed intra-abd source, afebrile, WBC increased to 15.7 4/9: Merrem >> 4/13 Vanc >> 4/13 Mycofungin >>  4/9: Bld x 2 - NGTD 4/9: MRSA PCR - negative 4/13: Blood - NGTD  Best Practices: MC, IV PPI, SCDs TPN Access: RIJ CVC TPN day#: 7   Plan:  - Maintain Clinimix E 5/20 at 40 ml/hr (80% goal) and Osmolite 1.2 at 10 ml/hr per MD.  TPN + TF to meet patient's needs. - Multivitamin PT daily - Supplement trace elements on MWF due to ongoing national shortages - No MWF IV lipids as patient to start TF - Change SSI to moderate as expect CBGs to trend up further with the initiation of TF - F/U with TF tolerance -  Standard TPN labs      Evrett Hakim D. Laney Potash, PharmD, BCPS Pager:  [phone] 01/07/2013, 9:58 AM

## 2013-01-07 NOTE — Consult Note (Addendum)
Ostomy follow-up: Ileostomy pouch intact with good seal.  Mod greenish-brown liquid stool in pouch. Stoma red and viable, visualized through pouch. Supplies ordered to bedside for staff use.  Pt intubated and not awake. Will continue to follow for additional teaching sessions when stable and out of ICU. Cammie Mcgee MSN, RN, CWOCN, [place], CNS [phone]

## 2013-01-07 NOTE — Progress Notes (Signed)
eLink Physician-Brief Progress Note Patient Name: Renee Nicholson DOB: [birth year] MRN: [id number]  Date of Service  01/07/2013   HPI/Events of Note   On routine rounds found pt in mild resp distress with RR 38 On PCV 15 Peep 5 bur 16.  Pco2 41 earlier on PCV 10  eICU Interventions  Increase pcv to 20 and rate to 24 as backup F/u ABGs in   Intervention Category Major Interventions: Respiratory failure - evaluation and management  Shan Levans 01/07/2013, 3:34 PM

## 2013-01-07 NOTE — Progress Notes (Signed)
ET Tube withdrawn 2 cm per MD order and resecured at 20 cm.

## 2013-01-07 NOTE — Progress Notes (Signed)
Pt. Self extubated herself. ED physician re-intubated pt.

## 2013-01-08 ENCOUNTER — Other Ambulatory Visit: Payer: Self-pay

## 2013-01-08 ENCOUNTER — Inpatient Hospital Stay (HOSPITAL_COMMUNITY): Payer: BC Managed Care – PPO

## 2013-01-08 DIAGNOSIS — R579 Shock, unspecified: Secondary | ICD-10-CM

## 2013-01-08 LAB — COMPREHENSIVE METABOLIC PANEL
Alkaline Phosphatase: 103 U/L (ref 39–117)
BUN: 22 mg/dL (ref 6–23)
CO2: 29 mEq/L (ref 19–32)
Chloride: 104 mEq/L (ref 96–112)
Creatinine, Ser: 0.48 mg/dL — ABNORMAL LOW (ref 0.50–1.10)
GFR calc Af Amer: >90 mL/min (ref >90)
GFR calc non Af Amer: >90 mL/min (ref >90)
Glucose, Bld: 316 mg/dL — ABNORMAL HIGH (ref 70–99)
Potassium: 3.5 mEq/L (ref 3.5–5.1)
Total Bilirubin: 2.4 mg/dL — ABNORMAL HIGH (ref 0.3–1.2)

## 2013-01-08 LAB — DIFFERENTIAL
Basophils Relative: 0 % (ref 0–1)
Eosinophils Absolute: 0 10*3/uL (ref 0.0–0.7)
Lymphocytes Relative: 4 % — ABNORMAL LOW (ref 12–46)
Lymphs Abs: 0.7 10*3/uL (ref 0.7–4.0)
Neutro Abs: 17 10*3/uL — ABNORMAL HIGH (ref 1.7–7.7)

## 2013-01-08 LAB — CBC
HCT: 31.1 % — ABNORMAL LOW (ref 36.0–46.0)
Hemoglobin: 11.1 g/dL — ABNORMAL LOW (ref 12.0–15.0)
MCH: 28.9 pg (ref 26.0–34.0)
MCHC: 35.7 g/dL (ref 30.0–36.0)
MCV: 81 fL (ref 78.0–100.0)
RDW: 15.3 % (ref 11.5–15.5)

## 2013-01-08 LAB — BASIC METABOLIC PANEL
BUN: 30 mg/dL — ABNORMAL HIGH (ref 6–23)
Creatinine, Ser: 0.53 mg/dL (ref 0.50–1.10)
GFR calc Af Amer: >90 mL/min (ref >90)
GFR calc non Af Amer: >90 mL/min (ref >90)
Potassium: 2.9 mEq/L — ABNORMAL LOW (ref 3.5–5.1)

## 2013-01-08 LAB — GLUCOSE, CAPILLARY
Glucose-Capillary: 204 mg/dL — ABNORMAL HIGH (ref 70–99)
Glucose-Capillary: 153 mg/dL — ABNORMAL HIGH (ref 70–99)

## 2013-01-08 LAB — TRIGLYCERIDES: Triglycerides: 255 mg/dL — ABNORMAL HIGH (ref <150)

## 2013-01-08 LAB — VANCOMYCIN, TROUGH: Vancomycin Tr: 5.3 ug/mL — ABNORMAL LOW (ref 10.0–20.0)

## 2013-01-08 LAB — MAGNESIUM: Magnesium: 1.9 mg/dL (ref 1.5–2.5)

## 2013-01-08 LAB — PHOSPHORUS: Phosphorus: 2.4 mg/dL (ref 2.3–4.6)

## 2013-01-08 LAB — PREALBUMIN: Prealbumin: 9.4 mg/dL — ABNORMAL LOW (ref 17.0–34.0)

## 2013-01-08 MED ORDER — POTASSIUM CHLORIDE 20 MEQ/15ML (10%) PO LIQD: 40.0000 meq | Freq: Every day | ORAL | Status: DC

## 2013-01-08 MED ORDER — PROPOFOL 10 MG/ML IV EMUL: 5.0000 ug/kg/min | Freq: Once | INTRAVENOUS | Status: DC

## 2013-01-08 MED ORDER — SODIUM CHLORIDE 0.9 % IV BOLUS (SEPSIS)
1000.0000 mL | Freq: Once | INTRAVENOUS | Status: AC
  Administered 2013-01-08: 1000 mL via INTRAVENOUS

## 2013-01-08 MED ORDER — MIDAZOLAM HCL 2 MG/2ML IJ SOLN
INTRAMUSCULAR | Status: AC
  Filled 2013-01-08: qty 2

## 2013-01-08 MED ORDER — POTASSIUM CHLORIDE 20 MEQ/15ML (10%) PO LIQD
ORAL | Status: AC
  Filled 2013-01-08: qty 30

## 2013-01-08 MED ORDER — VECURONIUM BROMIDE 10 MG IV SOLR
10.0000 mg | Freq: Once | INTRAVENOUS | Status: DC
  Filled 2013-01-08: qty 10

## 2013-01-08 MED ORDER — DOPAMINE-DEXTROSE 3.2-5 MG/ML-% IV SOLN
2.0000 ug/kg/min | INTRAVENOUS | Status: DC
  Administered 2013-01-08: 5 ug/kg/min via INTRAVENOUS

## 2013-01-08 MED ORDER — POTASSIUM CHLORIDE 20 MEQ/15ML (10%) PO LIQD: 40.0000 meq | Freq: Once | ORAL | Status: DC

## 2013-01-08 MED ORDER — POTASSIUM CHLORIDE 10 MEQ/50ML IV SOLN
10.0000 meq | INTRAVENOUS | Status: AC
  Administered 2013-01-08 – 2013-01-09 (×5): 10 meq via INTRAVENOUS
  Filled 2013-01-08: qty 250

## 2013-01-08 MED ORDER — SODIUM GLYCEROPHOSPHATE 1 MMOLE/ML IV SOLN
10.0000 mmol | Freq: Once | INTRAVENOUS | Status: AC
  Administered 2013-01-08: 10 mmol via INTRAVENOUS
  Filled 2013-01-08: qty 10

## 2013-01-08 MED ORDER — OSMOLITE 1.2 CAL PO LIQD
1000.0000 mL | ORAL | Status: DC
  Filled 2013-01-08 (×2): qty 1000

## 2013-01-08 MED ORDER — MAGNESIUM SULFATE 40 MG/ML IJ SOLN
2.0000 g | Freq: Once | INTRAMUSCULAR | Status: AC
  Administered 2013-01-08: 2 g via INTRAVENOUS
  Filled 2013-01-08: qty 50

## 2013-01-08 MED ORDER — PANTOPRAZOLE SODIUM 40 MG PO PACK
40.0000 mg | PACK | Freq: Two times a day (BID) | ORAL | Status: DC
  Administered 2013-01-08 – 2013-01-13 (×12): 40 mg
  Filled 2013-01-08 (×15): qty 20

## 2013-01-08 MED ORDER — ATROPINE SULFATE 1 MG/ML IJ SOLN
INTRAMUSCULAR | Status: AC
  Filled 2013-01-08: qty 1

## 2013-01-08 MED ORDER — FENTANYL CITRATE 0.05 MG/ML IJ SOLN: 200.0000 ug | Freq: Once | INTRAMUSCULAR | Status: DC

## 2013-01-08 MED ORDER — SODIUM PHOSPHATE 3 MMOLE/ML IV SOLN: 10.0000 mmol | Freq: Once | INTRAVENOUS | Status: DC

## 2013-01-08 MED ORDER — PANTOPRAZOLE SODIUM 40 MG PO PACK
40.0000 mg | PACK | Freq: Every day | ORAL | Status: DC
  Filled 2013-01-08: qty 20

## 2013-01-08 MED ORDER — POTASSIUM CHLORIDE 20 MEQ/15ML (10%) PO LIQD
40.0000 meq | Freq: Once | ORAL | Status: AC
  Administered 2013-01-08: 40 meq
  Filled 2013-01-08: qty 30

## 2013-01-08 MED ORDER — THIAMINE HCL 100 MG/ML IJ SOLN
100.0000 mg | Freq: Every day | INTRAMUSCULAR | Status: DC
  Administered 2013-01-08 – 2013-01-12 (×5): 100 mg via INTRAVENOUS
  Filled 2013-01-08 (×7): qty 1

## 2013-01-08 MED ORDER — ETOMIDATE 2 MG/ML IV SOLN
20.0000 mg | Freq: Once | INTRAVENOUS | Status: AC
  Administered 2013-01-07: 20 mg via INTRAVENOUS

## 2013-01-08 MED ORDER — MIDAZOLAM HCL 2 MG/2ML IJ SOLN: 4.0000 mg | Freq: Once | INTRAMUSCULAR | Status: DC

## 2013-01-08 MED ORDER — MIDAZOLAM HCL 2 MG/2ML IJ SOLN
2.0000 mg | INTRAMUSCULAR | Status: DC | PRN
  Administered 2013-01-08: 2 mg via INTRAVENOUS

## 2013-01-08 MED ORDER — TRACE MINERALS CR-CU-F-FE-I-MN-MO-SE-ZN IV SOLN
INTRAVENOUS | Status: DC
  Administered 2013-01-08: 17:00:00 via INTRAVENOUS
  Filled 2013-01-08: qty 1000

## 2013-01-08 MED ORDER — MIDAZOLAM HCL 2 MG/2ML IJ SOLN
INTRAMUSCULAR | Status: AC
  Administered 2013-01-08: 2 mg via INTRAVENOUS
  Filled 2013-01-08: qty 2

## 2013-01-08 MED ORDER — ETOMIDATE 2 MG/ML IV SOLN
40.0000 mg | Freq: Once | INTRAVENOUS | Status: DC
  Filled 2013-01-08: qty 20

## 2013-01-08 MED ORDER — FENTANYL CITRATE 0.05 MG/ML IJ SOLN
200.0000 ug | Freq: Once | INTRAMUSCULAR | Status: AC
  Administered 2013-01-09: 200 ug via INTRAVENOUS

## 2013-01-08 MED ORDER — SODIUM CHLORIDE 0.9 % IV SOLN
INTRAVENOUS | Status: DC
  Administered 2013-01-09: 06:00:00 via INTRAVENOUS

## 2013-01-08 MED ORDER — DOPAMINE-DEXTROSE 3.2-5 MG/ML-% IV SOLN
INTRAVENOUS | Status: AC
  Filled 2013-01-08: qty 250

## 2013-01-08 MED ORDER — POTASSIUM CHLORIDE 20 MEQ/15ML (10%) PO LIQD
40.0000 meq | Freq: Once | ORAL | Status: AC
  Administered 2013-01-09: 40 meq
  Filled 2013-01-08: qty 30

## 2013-01-08 MED ORDER — VECURONIUM BROMIDE 10 MG IV SOLR: 10.0000 mg | Freq: Once | INTRAVENOUS | Status: DC

## 2013-01-08 MED ORDER — SODIUM CHLORIDE 0.9 % IV SOLN
2.0000 mg/h | INTRAVENOUS | Status: DC
  Administered 2013-01-08 – 2013-01-09 (×2): 2 mg/h via INTRAVENOUS
  Administered 2013-01-09: 1 mg/h via INTRAVENOUS
  Filled 2013-01-08 (×4): qty 10

## 2013-01-08 MED ORDER — VASOPRESSIN 20 UNIT/ML IJ SOLN
0.0300 [IU]/min | INTRAVENOUS | Status: DC
  Administered 2013-01-08: 0.03 [IU]/min via INTRAVENOUS
  Filled 2013-01-08: qty 2.5

## 2013-01-08 MED ORDER — ETOMIDATE 2 MG/ML IV SOLN: 40.0000 mg | Freq: Once | INTRAVENOUS | Status: DC

## 2013-01-08 MED ORDER — MIDAZOLAM HCL 2 MG/2ML IJ SOLN: 1.0000 mg | INTRAMUSCULAR | Status: DC | PRN

## 2013-01-08 MED ORDER — POTASSIUM CHLORIDE 10 MEQ/50ML IV SOLN: 10.0000 meq | INTRAVENOUS | Status: DC

## 2013-01-08 NOTE — Progress Notes (Signed)
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 Presented 4/8 with a 24 hour history of abdomnial pain, N/V, and minimal ostomy output. She was admitted for SBO. 4/9 she became tachycardiac and hypotensive. PCCM has been asked to see 4/9  SIGNIFICANT EVENTS / STUDIES:  4/8 - Abd XRay - Small Bowel Obstruction. No peritoneal air.  4/9 - CT abd/pelvis >>> ?LLL consolidation, SBO and adhesions, no free air 4/10 - ETT for tachypnea. 4/15 - Self extubated and reintubate 4/11- OR lysis of adhesions after being febrile 4/12- Tm 103, repeat cx, add vancomycin/micafungin 4/13- Change to pressure control, lasix x 1 dose 4/15 self extubates, rapid reintubated 4/16 - Enteral feeding    LINES / TUBES: CVL 4/9 >>> NGT 4/9 >>> RIJ 4/9 >>> ETT 4/10 >>>4/15 self extub rapid retubed>>>  CULTURES: BC x 2 4/9 >>> Negative  Urine 4/9 >>> Negative  Repeat BC 4/13 >> NGTD  ANTIBIOTICS:  Meropenem 4/9 >>> Vancomycin 4/13 >> Micafungin 4/13 >>  SUBJECTIVE:  Remains on pressors and pressure control. Self extubated following increased agitation and delirium last night. She was re-intubated.  She remains calm afterward. Tolerating enteral feeds. Afebrile.   VITAL SIGNS: Temp:  [97.5 F (36.4 C)-98.5 F (36.9 C)] 98.4 F (36.9 C) (04/16 0759) Pulse Rate:  [60-109] 77 (04/16 0700) Resp:  [11-43] 24 (04/16 0700) BP: (56-152)/(35-99) 108/66 mmHg (04/16 0700) SpO2:  [84 %-100 %] 100 % (04/16 0700) FiO2 (%):  [39.6 %-100 %] 49.8 % (04/16 0700) Weight:  [102 lb 8.2 oz (46.5 kg)] 102 lb 8.2 oz (46.5 kg) (04/16 0500) HEMODYNAMICS: CVP:  [1 mmHg-3  mmHg] 1 mmHg VENTILATOR SETTINGS: Vent Mode:  [-] PCV FiO2 (%):  [39.6 %-100 %] 49.8 % Set Rate:  [16 bmp-24 bmp] 24 bmp PEEP:  [4.7 cmH20-5 cmH20] 5 cmH20 Pressure Support:  [5 cmH20] 5 cmH20 Plateau Pressure:  [21 cmH20-23 cmH20] 23 cmH20 INTAKE / OUTPUT: Intake/Output     04/15 0701 - 04/16 0700 04/16 0701 - 04/17 0700   I.V. (mL/kg) 1460.4 (31.4)    NG/GT 110    IV Piggyback 1300    TPN 989    Total Intake(mL/kg) 3859.4 (83)    Urine (mL/kg/hr) 3650 (3.3)    Emesis/NG output     Stool 425 (0.4)    Total Output 4075     Net -215.6            PHYSICAL EXAMINATION: General:  Petite female, acutely ill in appearance.  Neuro:  sedated HEENT: ETT, no JVD Cardiovascular:  tachy no m/r/g Lungs: upper respiratory noise.  Abdomen:  NGT, colostom , surgical dsg intact and clean. Colostomy bag has greenish appearing contents about 100cc  Musculoskeletal:  Intact Skin:  Intact  LABS:  Recent Labs Lab 01/01/13 1530  01/02/13 0500 01/02/13 0600  01/02/13 1130  01/03/13 0455  01/05/13 0500 01/05/13 0600  01/06/13 0430  01/06/13 2025 01/07/13 0108 01/07/13 0411 01/07/13 0500 01/07/13 0515 01/07/13 1626 01/08/13 0300  HGB 10.8*  < > 11.2*  --   --   --   < > 7.8*  < > 11.7*  --   --  11.8*  --   --   --   --   --  11.6*  --  11.1*  WBC 2.0*  < > 3.3*  --   --   --   < > 1.4*  < > 13.7*  --   --  13.3*  --   --   --   --   --  15.7*  --  17.9*  PLT 169  < > 163  --   --   --   < > 84*  < > 53*  --   --  43*  --   --   --   --   --  40*  --  36*  NA 140  < > 142  --   --  143  --  144  < > 148*  --   --  150*  < > 151*  --   --  148*  --   --  141  K 3.8  < > 4.4  --   --  3.4*  --  2.8*  < > 4.4  --   --  3.0*  < > 3.3*  --   --  3.3*  --   --  3.5  CL 114*  < > 120*  --   --  115*  --  116*  < > 118*  --   --  115*  < > 113*  --   --  110  --   --  104  CO2 13*  < > 11*  --   --  15*  --  16*  < > 20  --   --  26  < > 30  --   --  29  --   --  29  GLUCOSE 112*  < > 166*   --   --  298*  --  150*  < > 211*  --   --  185*  < > 194*  --   --  203*  --   --  316*  BUN 23  < > 16  --   --  15  --  13  < > 26*  --   --  26*  < > 28*  --   --  22  --   --  22  CREATININE 1.01  < > 0.75  --   --  0.70  --  0.64  < > 0.85  --   --  0.68  < > 0.68  --   --  0.55  --   --  0.48*  CALCIUM 6.5*  < > 6.4*  --   --  6.8*  --  7.8*  < > 7.6*  --   --  8.3*  < > 8.3*  --   --  8.1*  --   --  7.8*  MG  --   < > 1.3*  --   --   --   --  2.3  --   --   --   --   --   --  1.5  --   --  1.8  --   --  1.9  PHOS  --   < > 2.2*  --   --   --   --  1.8*  < > 3.8  --   --   --   --  2.1*  --   --  2.4  --   --  1.8*  AST 22  < > 68*  --   --  88*  --   --   --  306*  --   --   --   --   --   --   --   --   --   --  59*  ALT 12  < > 34  --   --  56*  --   --   --  167*  --   --   --   --   --   --   --   --   --   --  64*  ALKPHOS 33*  < > 33*  --   --  47  --   --   --  61  --   --   --   --   --   --   --   --   --   --  103  BILITOT 0.7  < > 0.3  --   --  0.5  --   --   --  2.8*  --   --   --   --   --   --   --   --   --   --  2.4*  PROT 4.1*  < > 4.1*  --   --  5.4*  --   --   --  4.8*  --   --   --   --   --   --   --   --   --   --  4.5*  ALBUMIN 1.9*  < > 1.7*  --   --  2.8*  --   --   --  2.2*  --   --   --   --   --   --   --   --   --   --  1.6*  APTT 34  --   --   --   --   --   --   --   --   --   --   --   --   --   --   --   --   --   --   --   --   INR 3.33*  --   --   --   --  1.54*  --  1.77*  < > 2.01*  --   --  1.66*  --   --   --   --  1.41  --   --  1.39  LATICACIDVEN 6.0*  --   --  3.8*  --  5.4*  --   --   --   --  2.4*  --   --   --   --   --   --   --   --   --   --   TROPONINI <0.30  --   --   --   --   --   --   --   --   --   --   --   --   --   --   --   --   --   --   --   --   PROCALCITON 61.35  --  76.89  --   --   --   --  46.27  --   --   --   --   --   --   --   --   --   --   --   --   --  PHART  --   < >  --   --   < >  --   --   --   < >  --   --   < >   --   < >  --  7.491* 7.453*  --   --  7.423  --   PCO2ART  --   < >  --   --   < >  --   --   --   < >  --   --   < >  --   < >  --  36.2 41.2  --   --  43.9  --   PO2ART  --   < >  --   --   < >  --   --   --   < >  --   --   < >  --   < >  --  114.0* 78.8*  --   --  115.0*  --   < > = values in this interval not displayed.  Recent Labs Lab 01/07/13 1224 01/07/13 1531 01/07/13 1915 01/07/13 2331 01/08/13 0356  GLUCAP 139* 178* 208* 173* 280*    Imaging: Dg Chest Port 1 View  01/07/2013  *RADIOLOGY REPORT*  Clinical Data: 47 year old female endotracheal tube repositioned.  PORTABLE CHEST - 1 VIEW  Comparison: 1957 hours the same day and earlier.  Findings: AP portable semi upright view 2008 hours.  Endotracheal tube tip in good position between the level of clavicles and carina.  Stable left chest Port-A-Cath and right IJ central line.  Continued gaseous distention of the stomach.  Stable lung volumes with mild elevation of the left hemidiaphragm.  Mildly decreased left lung base opacity.  Bilateral basilar predominant increased interstitial markings.  No pneumothorax.  Cardiac size and mediastinal contours are within normal limits.  IMPRESSION: 1.  Endotracheal tube in good position. 2.  Continued gaseous distention of the stomach.  Consider NG tube decompression. 3.  Mildly improved lung volumes.  Basilar increased interstitial markings persist.   Original Report Authenticated By: Erskine Speed, M.D.    Dg Chest Port 1 View  01/07/2013  *RADIOLOGY REPORT*  Clinical Data: The endotracheal tube reinserted after the patient probably 06/25/2009.  PORTABLE CHEST - 1 VIEW 7:57 p.m.  Comparison: 01/07/2013 at 8:18 a.m.  Findings: Endotracheal tube has been reinserted and is at the origin of the right mainstem stem bronchus and needs be retracted approximately 2.5 - 3 cm.  Port-A-Cath and jugular vein catheter appear in good position.  Heart size and vascularity are normal.  Pulmonary edema has almost  resolved on the right and is improved on the left.  IMPRESSION: Improving pulmonary edema.  Endotracheal tube needs to be retracted as described.   Original Report Authenticated By: Francene Boyers, M.D.    Dg Chest Port 1 View  01/07/2013  *RADIOLOGY REPORT*  Clinical Data: On ventilator  PORTABLE CHEST - 1 VIEW  Comparison: Portable exam 0818 hours compared to 01/06/2013  Findings: Tip of endotracheal tube 2.2 cm above carina. Nasogastric tube coiled in stomach. Left subclavian power port tip projects over cavoatrial junction. Right jugular central venous catheter tip projects over SVC near cavoatrial junction. Normal heart size and mediastinal contours. Diffuse bilateral pulmonary infiltrates little changed question edema versus infection. No gross pleural effusion or pneumothorax.  IMPRESSION: No interval change in diffuse bilateral pulmonary infiltrates.   Original Report Authenticated By: Ulyses Southward, M.D.  ASSESSMENT / PLAN:  PULMONARY A: Resp Failure, probable aspiration pneumonia, ARDS 4/14 > improvement in inspiratory volumes and improved aeration throughout all lung volumes. Small right effusion. 7.60/23.4/99.4.  P:   -Repeat ABG's after reintubation: 7.42/43/115/28.7, continue current PC needs - Aeration improved after single dose of IV lasix 40 mg, hold further, see cvs -maintain plat pressures less 30 - may need to transition back to prvc -would wean if rr 35-40, SBt atempt -consider early trach given reintubation needs after self extubation and neuro issues: also would hope to limit VAP risk Correct Phos   CARDIOVASCULAR A: Shock hypovolemic + septic, conts on norepi  Sinus tachy Meets criteria rel Ai now P:  Titrate pressors for  MAP: > 60 mm/Hg Check cvp now 1 - bolus further - Cortisol 88 >> 17.4, on I.V hydrocortisone q50 mg 6hr Add vasopressin .03 u/min in hopes to reduce levo needs   RENAL A:  Hypokalemic in setting of large NG output, hypomagnesemia and  hypophosphatemia, good UOP. Hypokalemia of 3.0 >3.8>3.5. Hypernatremia 150 > 148 - improved.  P:   Daily BMET Bolus, see cvs Replace phos, mag, k  GASTROINTESTINAL A:  Small Bowel Obstrucition w/ complicated surgical bowel history. NG output ~800 mL in 24hr. Hx of colon CA, many sgy, chemo, XRT S/p exploration on 4/11 w/ lysis of adhesions  P:   -NGT to intermittent suction -4/15 - started enteral feeds per tube. Tolerating well - No indication for abdominal ct as per surgery  -PPI  - Wound care consulted  HEMATOLOGIC A:  Baseline microcytic anemia. Pancytopenia.  Thrombocytopenia , likely sepsis related. Fibrinogen 706. P:  -Monitor/Supportive Care -continue to hold lovenox, only received one dose,  low clinical suspcion of HITT, send  -SCD -treat sepsis likely as source low plat  INFECTIOUS A:  Sepsis, presumed source intra-abd.  No perforation  P:   Meropenem (4/9) >>> Cultures pending Micafungin 4/13 >>no perf, may dc after some clinical progress, keep Repeat BC - no growth Will send UA, consider line change, follow pressor needs, no fevers  ENDOCRINE A:  Rel AI P:   -SSI Repeat cortisol - 17.4, continue steroids  NEUROLOGIC A:  Pt baseline anxiety disorder and acute Encephalopathy - in setting of shock P:   Sedation protocol fent Continue with versed WUA, may need head CT Dc versed  Signed:  Dow Adolph, MD PGY-1 Internal Medicine Teaching Service Pager: [phone] 01/08/2013, 8:03 AM   Ccm time independent 30 min  I have fully examined this patient and agree with above findings.    And edite din full  Mcarthur Rossetti. Tyson Alias, MD, FACP Pgr: [phone] [facility]

## 2013-01-08 NOTE — Progress Notes (Signed)
Patient ID: Renee Nicholson, female   DOB: 06/10/1966, 47 y.o.   MRN: [id number] 5 Days Post-Op  Subjective: Pt self extubated last night.  Immediately desatted and had respiratory distress.  She continues to have difficulty weaning off norepinephrine.  She remains on 2 mcg/min.  She is tolerating tube feeds.    Objective: Vital signs in last 24 hours: Temp:  [97.5 F (36.4 C)-98.5 F (36.9 C)] 97.5 F (36.4 C) (04/16 0430) Pulse Rate:  [60-109] 65 (04/16 0645) Resp:  [11-43] 24 (04/16 0645) BP: (56-152)/(35-99) 98/65 mmHg (04/16 0645) SpO2:  [84 %-100 %] 100 % (04/16 0645) FiO2 (%):  [39.6 %-100 %] 50.2 % (04/16 0645) Weight:  [102 lb 8.2 oz (46.5 kg)] 102 lb 8.2 oz (46.5 kg) (04/16 0500) Last BM Date: 01/07/13  Intake/Output from previous day: 04/15 0701 - 04/16 0700 In: 3868.4 [I.V.:1460.4; NG/GT:110; IV Piggyback:1300; TPN:998] Out: 4075 [Urine:3650; Stool:425] Intake/Output this shift:    General appearance: sedated Resp: coarse BS bilat Cardio: regular rate and rhythm GI: s/nd/wound c/d/i/ostomy patent, pink.  Bilious output and gas in bag.    Lab Results:   Recent Labs  01/07/13 0515 01/08/13 0300  WBC 15.7* 17.9*  HGB 11.6* 11.1*  HCT 32.5* 31.1*  PLT 40* 36*   BMET  Recent Labs  01/07/13 0500 01/08/13 0300  NA 148* 141  K 3.3* 3.5  CL 110 104  CO2 29 29  GLUCOSE 203* 316*  BUN 22 22  CREATININE 0.55 0.48*  CALCIUM 8.1* 7.8*   PT/INR  Recent Labs  01/07/13 0500 01/08/13 0300  LABPROT 16.9* 16.7*  INR 1.41 1.39   ABG  Recent Labs  01/07/13 0411 01/07/13 1626  PHART 7.453* 7.423  HCO3 28.4* 28.7*    Studies/Results: Dg Chest Port 1 View  01/07/2013  *RADIOLOGY REPORT*  Clinical Data: 47 year old female endotracheal tube repositioned.  PORTABLE CHEST - 1 VIEW  Comparison: 1957 hours the same day and earlier.  Findings: AP portable semi upright view 2008 hours.  Endotracheal tube tip in good position between the level of clavicles and  carina.  Stable left chest Port-A-Cath and right IJ central line.  Continued gaseous distention of the stomach.  Stable lung volumes with mild elevation of the left hemidiaphragm.  Mildly decreased left lung base opacity.  Bilateral basilar predominant increased interstitial markings.  No pneumothorax.  Cardiac size and mediastinal contours are within normal limits.  IMPRESSION: 1.  Endotracheal tube in good position. 2.  Continued gaseous distention of the stomach.  Consider NG tube decompression. 3.  Mildly improved lung volumes.  Basilar increased interstitial markings persist.   Original Report Authenticated By: Erskine Speed, M.D.    Dg Chest Port 1 View  01/07/2013  *RADIOLOGY REPORT*  Clinical Data: The endotracheal tube reinserted after the patient probably 06/25/2009.  PORTABLE CHEST - 1 VIEW 7:57 p.m.  Comparison: 01/07/2013 at 8:18 a.m.  Findings: Endotracheal tube has been reinserted and is at the origin of the right mainstem stem bronchus and needs be retracted approximately 2.5 - 3 cm.  Port-A-Cath and jugular vein catheter appear in good position.  Heart size and vascularity are normal.  Pulmonary edema has almost resolved on the right and is improved on the left.  IMPRESSION: Improving pulmonary edema.  Endotracheal tube needs to be retracted as described.   Original Report Authenticated By: Francene Boyers, M.D.    Dg Chest Port 1 View  01/07/2013  *RADIOLOGY REPORT*  Clinical Data: On ventilator  PORTABLE CHEST -  1 VIEW  Comparison: Portable exam 0818 hours compared to 01/06/2013  Findings: Tip of endotracheal tube 2.2 cm above carina. Nasogastric tube coiled in stomach. Left subclavian power port tip projects over cavoatrial junction. Right jugular central venous catheter tip projects over SVC near cavoatrial junction. Normal heart size and mediastinal contours. Diffuse bilateral pulmonary infiltrates little changed question edema versus infection. No gross pleural effusion or pneumothorax.   IMPRESSION: No interval change in diffuse bilateral pulmonary infiltrates.   Original Report Authenticated By: Ulyses Southward, M.D.     Anti-infectives: Anti-infectives   Start     Dose/Rate Route Frequency Ordered Stop   01/06/13 0000  vancomycin (VANCOCIN) IVPB 1000 mg/200 mL premix     1,000 mg 200 mL/hr over 60 Minutes Intravenous Every 24 hours 01/05/13 0106     01/05/13 1000  micafungin (MYCAMINE) 100 mg in sodium chloride 0.9 % 100 mL IVPB     100 mg 100 mL/hr over 1 Hours Intravenous Daily 01/05/13 0030     01/05/13 0115  vancomycin (VANCOCIN) IVPB 1000 mg/200 mL premix     1,000 mg 200 mL/hr over 60 Minutes Intravenous NOW 01/05/13 0106 01/05/13 0221   01/02/13 0100  meropenem (MERREM) 1 g in sodium chloride 0.9 % 100 mL IVPB     1 g 200 mL/hr over 30 Minutes Intravenous 2 times daily 01/01/13 2354     01/01/13 1600  meropenem (MERREM) 1 g in sodium chloride 0.9 % 100 mL IVPB     1 g 200 mL/hr over 30 Minutes Intravenous STAT 01/01/13 1535 01/01/13 1640      Assessment/Plan: s/p Procedure(s): EXPLORATORY LAPAROTOMY WITH LYSIS OF ADHESIONS, revision of ileostomy (N/A) Continue abx Would not do abd/pelvic CT scan at this time.  She is tolerating feeds, and she had no contamination of abdomen intraoperatively.   Critical care per CCM, wean vent as tolerated.  Try increasing tube feeds today.   Continue TPN     LOS: 8 days    [facility] 01/08/2013

## 2013-01-08 NOTE — Progress Notes (Signed)
  Echocardiogram 2D Echocardiogram has been performed.  Renee Nicholson 01/08/2013, 3:30 PM

## 2013-01-08 NOTE — Progress Notes (Signed)
Was called to evaluate pt with arrhythmia. EKG showed bradycardia, low voltage QRS, no ST depressions or q-waves. Pt slightly hypotensive SBP 90s.   Lab review: labs reviewed. Hx of hypoMg/hypoPO4, hypoK. All been replaced today.  ECHO reviewed no WMA. EF 60-65%  PE: pt sedated, ostomy draining, brady no murmurs/gallops/S3/S4,   A/P: suspect refeeding syndrome given hx significant anorexia and weight loss in previous months 2/2 SBO and rectal carcinoma. Just started TF and TPN.   Trop x1 Thiamine 100mg  Bmet Mg, PO4 labs Updated family at bedside If any other arrhytmia nurse to get EKG and page  Christen Bame, MD PGY-1 Pgr: [phone]

## 2013-01-08 NOTE — Progress Notes (Signed)
NUTRITION FOLLOW UP  Intervention:    Increase Osmolite 1.2 to 20 ml/h today to provide 576 kcals, 27 gm protein, 394 ml free water daily.   Recommend increasing by 10 ml every 4 hours to goal rate of 40 ml/h to provide 1152 kcals, 53 gm protein, 787 ml free water daily.  Recommend wean TPN as TF is advanced.  Nutrition Dx:   Inadequate oral intake related to inability to eat as evidenced by NPO status. Ongoing.  Goal:   Intake to meet >90% of estimated nutrition needs, met with TPN + TF.  Monitor:   TF tolerance/adequacy, weight trend, labs, vent status.  Assessment:   Patient self-extubated yesterday evening. Required re-intubation. Patient remains intubated on ventilator support.  MV: 11.1 Temp:Temp (24hrs), Avg:98 F (36.7 C), Min:97.5 F (36.4 C), Max:98.5 F (36.9 C)  TF was initiated yesterday via OGT with Osmolite 1.2 at 10 ml/h. This is providing 288 kcals, 13 gm protein, 197 ml free water per day. Patient is tolerating trophic feedings well. Plans to increase feedings today per Surgery note.  Discussed with CCM resident; Osmolite 1.2 increased to 20 ml/h.  Patient is receiving TPN with Clinimix E 5/20 @ 40 ml/hr.  Lipids (20% IVFE @ 9 ml/hr), multivitamins, and trace elements are provided 3 times weekly (MWF) due to national backorder.  Provides 1030 kcal and 48 grams protein daily (based on weekly average).  Meets 76% minimum estimated kcal and 96% minimum estimated protein needs.  Patient has shown signs of refeeding syndrome with shifts in electrolytes.  Phosphorus is low today.  Magnesium and Potassium are WNL today. Electrolytes are being replaced.   Height: Ht Readings from Last 1 Encounters:  01/04/13 4\' 4"  (1.321 m)    Weight Status:   Wt Readings from Last 1 Encounters:  01/08/13 102 lb 8.2 oz (46.5 kg)  01/03/13  98 lb 8.7 oz (44.7 kg)  01/01/13  81 lb (36.741 kg) 12/31/12  79 lb (35.834 kg)  12/25/12 72 lb 3.2 oz (32.75 kg)   Weight trending up with  fluid resuscitation.  Re-estimated needs:  Kcal: 1150 Protein: 50-65 gm Fluid: 1.2-1.3 L  Skin: surgical abdominal incisions  Diet Order: NPO   Intake/Output Summary (Last 24 hours) at 01/08/13 1108 Last data filed at 01/08/13 1000  Gross per 24 hour  Intake 3757.52 ml  Output   3825 ml  Net -67.48 ml    Last BM: 4/16 (ileostomy)  Labs:   Recent Labs Lab 01/06/13 2025 01/07/13 0500 01/08/13 0300  NA 151* 148* 141  K 3.3* 3.3* 3.5  CL 113* 110 104  CO2 30 29 29   BUN 28* 22 22  CREATININE 0.68 0.55 0.48*  CALCIUM 8.3* 8.1* 7.8*  MG 1.5 1.8 1.9  PHOS 2.1* 2.4 1.8*  GLUCOSE 194* 203* 316*    CBG (last 3)   Recent Labs  01/07/13 2331 01/08/13 0356 01/08/13 0750  GLUCAP 173* 280* 184*    Scheduled Meds: . antiseptic oral rinse  15 mL Mouth Rinse q12n4p  . chlorhexidine  15 mL Mouth/Throat BID  . feeding supplement (OSMOLITE 1.2 CAL)  1,000 mL Per Tube Q24H  . hydrocortisone sod succinate (SOLU-CORTEF) injection  50 mg Intravenous Q6H  . insulin aspart  0-15 Units Subcutaneous Q4H  . magnesium sulfate 1 - 4 g bolus IVPB  2 g Intravenous Once  . meropenem (MERREM) IV  1 g Intravenous BID  . micafungin (MYCAMINE) IV  100 mg Intravenous Daily  . multivitamin  5 mL Oral Daily  . pantoprazole sodium  40 mg Per Tube BID  . potassium chloride  10 mEq Intravenous Q1 Hr x 3  . sodium chloride  1,000 mL Intravenous Once  . sodium chloride  10-40 mL Intracatheter Q12H  . sodium glycerophosphate 0.9% NaCl IVPB  10 mmol Intravenous Once  . vancomycin  1,000 mg Intravenous Q24H    Continuous Infusions: . Marland KitchenTPN (CLINIMIX-E) Adult 40 mL/hr at 01/07/13 2000  . Marland KitchenTPN (CLINIMIX-E) Adult    . fentaNYL infusion INTRAVENOUS 200 mcg/hr (01/08/13 0600)  . norepinephrine (LEVOPHED) Adult infusion 4 mcg/min (01/08/13 1000)  . vasopressin (PITRESSIN) infusion - *FOR SHOCK*      Joaquin Courts, RD, LDN, CNSC Pager [phone] After Hours Pager [phone]

## 2013-01-08 NOTE — Progress Notes (Signed)
Hypotension still on Levophed nursing staff trying to titrate down, CVP 2;   Give 1L NS

## 2013-01-08 NOTE — Progress Notes (Signed)
ANTIBIOTIC CONSULT NOTE - Follow-Up   Pharmacy Consult for vancomycin, meropenem Indication: R/o sepsis   No Known Allergies  Patient Measurements: Height: 4\' 4"  (132.1 cm) Weight: 102 lb 8.2 oz (46.5 kg) IBW/kg (Calculated) : 27.1  Vital Signs: Temp: 97.6 F (36.4 C) (04/16 2355) Temp src: Oral (04/16 2355) BP: 138/72 mmHg (04/16 2350) Pulse Rate: 55 (04/16 2350) Intake/Output from previous day: 04/15 0701 - 04/16 0700 In: 3983.3 [I.V.:1534.3; NG/GT:120; IV Piggyback:1300; TPN:1029] Out: 4075 [Urine:3650; Stool:425] Intake/Output from this shift: Total I/O In: 454 [I.V.:254; NG/GT:40; TPN:160] Out: 600 [Urine:600]  Labs:  Recent Labs  01/06/13 0430  01/07/13 0500 01/07/13 0515 01/08/13 0300 01/08/13 2140  WBC 13.3*  --   --  15.7* 17.9*  --   HGB 11.8*  --   --  11.6* 11.1*  --   PLT 43*  --   --  40* 36*  --   CREATININE 0.68  < > 0.55  --  0.48* 0.53  < > = values in this interval not displayed. Estimated Creatinine Clearance: 47.9 ml/min (by C-G formula based on Cr of 0.53).   Microbiology: Recent Results (from the past 720 hour(s))  MRSA PCR SCREENING     Status: None   Collection Time    01/01/13  2:12 PM      Result Value Range Status   MRSA by PCR NEGATIVE  NEGATIVE Final   Comment:            The GeneXpert MRSA Assay (FDA     approved for NASAL specimens     only), is one component of a     comprehensive MRSA colonization     surveillance program. It is not     intended to diagnose MRSA     infection nor to guide or     monitor treatment for     MRSA infections.  CULTURE, BLOOD (ROUTINE X 2)     Status: None   Collection Time    01/01/13  3:04 PM      Result Value Range Status   Specimen Description BLOOD CENTRAL LINE   Final   Special Requests BOTTLES DRAWN AEROBIC AND ANAEROBIC 10CC   Final   Culture  Setup Time 01/01/2013 21:21   Final   Culture NO GROWTH 5 DAYS   Final   Report Status 01/07/2013 FINAL   Final  CULTURE, BLOOD (ROUTINE  X 2)     Status: None   Collection Time    01/01/13  3:09 PM      Result Value Range Status   Specimen Description BLOOD CENTRAL LINE   Final   Special Requests BOTTLES DRAWN AEROBIC AND ANAEROBIC 10CC   Final   Culture  Setup Time 01/01/2013 21:19   Final   Culture NO GROWTH 5 DAYS   Final   Report Status 01/07/2013 FINAL   Final  CULTURE, BLOOD (ROUTINE X 2)     Status: None   Collection Time    01/05/13  1:00 AM      Result Value Range Status   Specimen Description BLOOD LEFT HAND   Final   Special Requests BOTTLES DRAWN AEROBIC ONLY 10CC   Final   Culture  Setup Time 01/05/2013 13:12   Final   Culture     Final   Value:        BLOOD CULTURE RECEIVED NO GROWTH TO DATE CULTURE WILL BE HELD FOR 5 DAYS BEFORE ISSUING A FINAL NEGATIVE REPORT  Report Status PENDING   Incomplete  CULTURE, BLOOD (ROUTINE X 2)     Status: None   Collection Time    01/05/13  1:10 AM      Result Value Range Status   Specimen Description BLOOD RIGHT HAND   Final   Special Requests BOTTLES DRAWN AEROBIC ONLY 10CC   Final   Culture  Setup Time 01/05/2013 13:12   Final   Culture     Final   Value:        BLOOD CULTURE RECEIVED NO GROWTH TO DATE CULTURE WILL BE HELD FOR 5 DAYS BEFORE ISSUING A FINAL NEGATIVE REPORT   Report Status PENDING   Incomplete  URINE CULTURE     Status: None   Collection Time    01/05/13  1:21 AM      Result Value Range Status   Specimen Description URINE, CATHETERIZED   Final   Special Requests NONE   Final   Culture  Setup Time 01/05/2013 13:21   Final   Colony Count NO GROWTH   Final   Culture NO GROWTH   Final   Report Status 01/06/2013 FINAL   Final    Medical History: Past Medical History  Diagnosis Date  . Anemia   . Diarrhea   . Anxiety   . Bright red rectal bleeding 09/13/2011  . History of chemotherapy     completed 09/2011   . Hx of radiation therapy 08/29/11 to 10/09/11    rectum  . GERD (gastroesophageal reflux disease)   . Ileostomy in place   . Cancer    . Rectal cancer     Medications:  Prescriptions prior to admission  Medication Sig Dispense Refill  . B Complex-C (B-COMPLEX WITH VITAMIN C) tablet Take 1 tablet by mouth daily.      . ferrous sulfate 325 (65 FE) MG tablet Take 325 mg by mouth daily with breakfast.      . lactose free nutrition (BOOST) LIQD Take 237 mLs by mouth 2 (two) times daily.      . Multiple Vitamin (MULTIVITAMIN) tablet Take 1 tablet by mouth every morning.       . pantoprazole (PROTONIX) 40 MG tablet Take 40 mg by mouth every morning.       . potassium chloride SA (K-DUR,KLOR-CON) 20 MEQ tablet Take 20 mEq by mouth every morning.      . Simethicone 125 MG CAPS Take 1 capsule by mouth 4 (four) times daily as needed (for gas or upset stomach).        Scheduled:  . antiseptic oral rinse  15 mL Mouth Rinse q12n4p  . [EXPIRED] atropine      . chlorhexidine  15 mL Mouth/Throat BID  . [START ON 01/09/2013] etomidate  40 mg Intravenous Once  . [START ON 01/09/2013] feeding supplement (OSMOLITE 1.2 CAL)  1,000 mL Per Tube Q24H  . [START ON 01/09/2013] fentaNYL  200 mcg Intravenous Once  . hydrocortisone sod succinate (SOLU-CORTEF) injection  50 mg Intravenous Q6H  . insulin aspart  0-15 Units Subcutaneous Q4H  . [COMPLETED] magnesium sulfate 1 - 4 g bolus IVPB  2 g Intravenous Once  . meropenem (MERREM) IV  1 g Intravenous BID  . micafungin (MYCAMINE) IV  100 mg Intravenous Daily  . multivitamin  5 mL Oral Daily  . pantoprazole sodium  40 mg Per Tube BID  . potassium chloride  10 mEq Intravenous Q1 Hr x 5  . [COMPLETED] potassium chloride  40 mEq Per  Tube Once  . [COMPLETED] potassium chloride  40 mEq Per Tube Once  . [START ON 01/09/2013] potassium chloride  40 mEq Per Tube Once  . [COMPLETED] potassium chloride      . [START ON 01/09/2013] propofol  5-70 mcg/kg/min Intravenous Once  . [COMPLETED] sodium chloride  1,000 mL Intravenous Once  . [COMPLETED] sodium chloride  1,000 mL Intravenous Once  . sodium chloride   10-40 mL Intracatheter Q12H  . [COMPLETED] sodium glycerophosphate 0.9% NaCl IVPB  10 mmol Intravenous Once  . thiamine  100 mg Intravenous Q2000  . vancomycin  1,000 mg Intravenous Q24H  . [START ON 01/09/2013] vecuronium  10 mg Intravenous Once  . [DISCONTINUED] etomidate  40 mg Intravenous Once  . [DISCONTINUED] feeding supplement (OSMOLITE 1.2 CAL)  1,000 mL Per Tube Q24H  . [DISCONTINUED] fentaNYL  200 mcg Intravenous Once  . [DISCONTINUED] midazolam  4 mg Intravenous Once  . [DISCONTINUED] midazolam  4 mg Intravenous Once  . [DISCONTINUED] pantoprazole (PROTONIX) IV  40 mg Intravenous Q12H  . [DISCONTINUED] pantoprazole sodium  40 mg Per Tube Q1200  . [DISCONTINUED] potassium chloride  10 mEq Intravenous Q1 Hr x 3  . [DISCONTINUED] potassium chloride  40 mEq Per Tube Daily  . [DISCONTINUED] potassium chloride  40 mEq Oral Once  . [DISCONTINUED] sodium phosphate  Dextrose 5% IVPB  10 mmol Intravenous Once  . [DISCONTINUED] vecuronium  10 mg Intravenous Once   Infusions:  . [EXPIRED] .TPN (CLINIMIX-E) Adult 40 mL/hr at 01/07/13 2000  . Marland KitchenTPN (CLINIMIX-E) Adult 40 mL/hr at 01/08/13 2000  . sodium chloride 20 mL/hr at 01/08/13 2000  . DOPamine Stopped (01/08/13 1445)  . fentaNYL infusion INTRAVENOUS 300 mcg/hr (01/08/13 2201)  . midazolam (VERSED) infusion 2 mg/hr (01/08/13 2000)  . norepinephrine (LEVOPHED) Adult infusion Stopped (01/08/13 1125)  . vasopressin (PITRESSIN) infusion - *FOR SHOCK* 0.03 Units/min (01/08/13 2000)  . [DISCONTINUED] dextrose Stopped (01/08/13 0855)  . [DISCONTINUED] midazolam (VERSED) infusion Stopped (01/08/13 1045)    Assessment: 47 yo female on D8 meropenem, D4 vancomycin and D4 micafungin for r/o sepsis. Vancomycin trough (5.3 mcg/mL) is below-goal on 1000mg  IV Q24H. Calculated CrCl of 48 ml/min likely under-estimates CrCl due to weight.   Goal of Therapy:  Vancomycin trough level 15-20 mcg/ml  Plan:   1. Change vancomycin dosing to 1gm IV Q12H.   2. Change meropenem to 1gm IV Q8H.   Lorre Munroe, PharmD 01/08/2013,11:59 PM

## 2013-01-08 NOTE — Progress Notes (Signed)
Patient on Levo since 11pm last night. Pressures stable in 120's 130's with maps in 70's-90's. Attempted to turn off levo. Patients pressures dropped to 60-70's systolic. Levo restarted and increased. At same time patient experienced a rhythm change with a wide complex. EKG ran but rhythm restored itself back to normal sinus. Continuous EKG running at this time. Dr. Frederico Hamman notified, liter bolus and EKG done.   Wyn Quaker RN

## 2013-01-08 NOTE — Progress Notes (Signed)
PARENTERAL NUTRITION CONSULT NOTE - Follow Up  Pharmacy Consult:  TPN Indication: SBO  No Known Allergies  Patient Measurements: Height: 4\' 4"  (132.1 cm) Weight: 102 lb 8.2 oz (46.5 kg) IBW/kg (Calculated) : 27.1 Usual Weight: 95 lb  Vital Signs: Temp: 97.5 F (36.4 C) (04/16 0430) Temp src: Oral (04/16 0430) BP: 108/66 mmHg (04/16 0700) Pulse Rate: 77 (04/16 0700) Intake/Output from previous day: 04/15 0701 - 04/16 0700 In: 3859.4 [I.V.:1460.4; NG/GT:110; IV Piggyback:1300; TPN:989] Out: 4075 [Urine:3650; Stool:425]  Labs:  Recent Labs  01/06/13 0430 01/07/13 0500 01/07/13 0515 01/08/13 0300  WBC 13.3*  --  15.7* 17.9*  HGB 11.8*  --  11.6* 11.1*  HCT 31.9*  --  32.5* 31.1*  PLT 43*  --  40* 36*  INR 1.66* 1.41  --  1.39     Recent Labs  01/06/13 2025 01/07/13 0500 01/08/13 0300  NA 151* 148* 141  K 3.3* 3.3* 3.5  CL 113* 110 104  CO2 30 29 29   GLUCOSE 194* 203* 316*  BUN 28* 22 22  CREATININE 0.68 0.55 0.48*  CALCIUM 8.3* 8.1* 7.8*  MG 1.5 1.8 1.9  PHOS 2.1* 2.4 1.8*  PROT  --   --  4.5*  ALBUMIN  --   --  1.6*  AST  --   --  59*  ALT  --   --  64*  ALKPHOS  --   --  103  BILITOT  --   --  2.4*  TRIG  --   --  255*  CHOL  --   --  90   Estimated Creatinine Clearance: 47.9 ml/min (by C-G formula based on Cr of 0.48).    Recent Labs  01/07/13 1915 01/07/13 2331 01/08/13 0356  GLUCAP 208* 173* 280*         Insulin Requirements in the past 24 hours:  10 units SSI  Current Nutrition:  Clinimix E 5/20 at 40 ml/hr without lipids = 844 kCal and 48 gm protein Osmolite 1.2 at 10 ml/hr = 288 kCal and 13gm protein (goal: 45 ml/hr)  Nutritional Goals:  1360 kCal, 50-65 grams of protein per day  Assessment:   Ms. 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  She also has a history of recurrent SBO.  Patient was admitted 4/8 (discharged 4/4) with the same diagoses, now with  ileus s/p LOA and ileostomy revision.  Patient tolerates TF and to be advanced today.  GI: Continues NPO, TPN not at goal d/t refeeding. Noted NGT output decreased, but ongoing.  Stool reported (decreasing) in ileostomy. Endo: no hx DM, CBGs trending up - on D5W, TF and steroids (173-316) Lytes: Na normalized on D5W, Phos low at 1.8 - Glycophos 10 mmol IV x 1 Renal: SCr remains stable. UOP is excellent without Lasix, D5W at 50 ml/hr Pulm: possible pulm edema per MD notes, vent day #7, FiO2 increased to 50% Cards: MAP at goal with norepinephrine - unable to wean, now at 2 mcg/min, CVP 1 - received saline bolus Hepatobil: LFTs/tbili elevated from baseline and remain elevated (alk phos surprisingly WNL). Now on TPN x 8 days - could be d/t cholestasis. This can be alleviated with cyclic TPN or enteral feeding. She is not a candidate for the former as fluid loads will be high over a shorter period of time - not ideal given her pulmonary status. GI feeding may be feasible if NGT output continues to  decline.  TC WNL, TG elevated at 255 Neuro: fentanyl gtt, versed resumed, GCS 10, RASS -1 (goal -2) ID: abx for sepsis, presumed intra-abd source, afebrile, WBC increased to 17.9, Solu-Cortef started 4/15 4/9: Merrem >> 4/13 Vanc >> 4/13 Mycofungin >  4/9: Bld x 2 - NGTD 4/9: MRSA PCR - negative 4/13: Blood - NGTD  Best Practices: MC, IV PPI, SCDs TPN Access: RIJ CVC TPN day#: 8   Plan:  - Continue Clinimix E 5/20 at 40 ml/hr, and Osmolite 1.2 - titration per MD.  TPN without lipids + TF at 10 ml/hr meets 83% of kCal and 100% of protein needs. - Multivitamin PT daily - Supplement trace elements on MWF due to ongoing national shortages - No MWF IV lipids as patient continues on tube feeding - F/U with TF advancement to start weaning TPN - Change Protonix to PT - F/U the addition of TF insulin coverage, hold off on adding insulin to TPN - Consider discontinuing D5W or change to 1/2NS - F/U  daily      Renee Nicholson D. Laney Potash, PharmD, BCPS Pager:  [phone] 01/08/2013, 9:44 AM

## 2013-01-09 ENCOUNTER — Inpatient Hospital Stay (HOSPITAL_COMMUNITY): Payer: BC Managed Care – PPO

## 2013-01-09 ENCOUNTER — Encounter: Payer: BC Managed Care – PPO | Admitting: Physical Therapy

## 2013-01-09 LAB — BASIC METABOLIC PANEL
Chloride: 108 mEq/L (ref 96–112)
Creatinine, Ser: 0.53 mg/dL (ref 0.50–1.10)
GFR calc Af Amer: >90 mL/min (ref >90)
GFR calc Af Amer: >90 mL/min (ref >90)
GFR calc non Af Amer: >90 mL/min (ref >90)
Glucose, Bld: 106 mg/dL — ABNORMAL HIGH (ref 70–99)
Potassium: 3.9 mEq/L (ref 3.5–5.1)
Potassium: 3.5 mEq/L (ref 3.5–5.1)
Sodium: 143 mEq/L (ref 135–145)
Sodium: 150 mEq/L — ABNORMAL HIGH (ref 135–145)

## 2013-01-09 LAB — CBC
Hemoglobin: 11.1 g/dL — ABNORMAL LOW (ref 12.0–15.0)
MCHC: 36 g/dL (ref 30.0–36.0)
RDW: 15.2 % (ref 11.5–15.5)

## 2013-01-09 LAB — COMPREHENSIVE METABOLIC PANEL
Albumin: 2 g/dL — ABNORMAL LOW (ref 3.5–5.2)
BUN: 30 mg/dL — ABNORMAL HIGH (ref 6–23)
Chloride: 105 mEq/L (ref 96–112)
Creatinine, Ser: 0.5 mg/dL (ref 0.50–1.10)
Total Bilirubin: 2.6 mg/dL — ABNORMAL HIGH (ref 0.3–1.2)

## 2013-01-09 LAB — GLUCOSE, CAPILLARY
Glucose-Capillary: 186 mg/dL — ABNORMAL HIGH (ref 70–99)
Glucose-Capillary: 169 mg/dL — ABNORMAL HIGH (ref 70–99)
Glucose-Capillary: 147 mg/dL — ABNORMAL HIGH (ref 70–99)
Glucose-Capillary: 116 mg/dL — ABNORMAL HIGH (ref 70–99)

## 2013-01-09 LAB — MAGNESIUM: Magnesium: 2.3 mg/dL (ref 1.5–2.5)

## 2013-01-09 LAB — SEDIMENTATION RATE: Sed Rate: 35 mm/hr — ABNORMAL HIGH (ref 0–22)

## 2013-01-09 LAB — PHOSPHORUS: Phosphorus: 2.4 mg/dL (ref 2.3–4.6)

## 2013-01-09 LAB — PROTIME-INR: Prothrombin Time: 16.4 seconds — ABNORMAL HIGH (ref 11.6–15.2)

## 2013-01-09 MED ORDER — VANCOMYCIN HCL IN DEXTROSE 1-5 GM/200ML-% IV SOLN
1000.0000 mg | Freq: Once | INTRAVENOUS | Status: AC
  Administered 2013-01-09: 1000 mg via INTRAVENOUS

## 2013-01-09 MED ORDER — VANCOMYCIN HCL IN DEXTROSE 1-5 GM/200ML-% IV SOLN
1000.0000 mg | Freq: Two times a day (BID) | INTRAVENOUS | Status: DC
  Administered 2013-01-09 (×2): 1000 mg via INTRAVENOUS
  Filled 2013-01-09 (×4): qty 200

## 2013-01-09 MED ORDER — OSMOLITE 1.2 CAL PO LIQD
1000.0000 mL | ORAL | Status: DC
  Administered 2013-01-09 – 2013-01-16 (×5): 1000 mL
  Filled 2013-01-09 (×10): qty 1000

## 2013-01-09 MED ORDER — CLINIMIX E/DEXTROSE (5/20) 5 % IV SOLN
INTRAVENOUS | Status: DC
  Filled 2013-01-09: qty 1000

## 2013-01-09 MED ORDER — ETOMIDATE 2 MG/ML IV SOLN
INTRAVENOUS | Status: AC
  Administered 2013-01-09: 30 mg
  Filled 2013-01-09: qty 10

## 2013-01-09 MED ORDER — WHITE PETROLATUM GEL
Status: AC
  Administered 2013-01-09: 11:00:00
  Filled 2013-01-09: qty 5

## 2013-01-09 MED ORDER — DOPAMINE-DEXTROSE 3.2-5 MG/ML-% IV SOLN
2.0000 ug/kg/min | INTRAVENOUS | Status: DC
  Administered 2013-01-09: 2.983 ug/kg/min via INTRAVENOUS
  Filled 2013-01-09: qty 250

## 2013-01-09 MED ORDER — NOREPINEPHRINE BITARTRATE 1 MG/ML IJ SOLN
2.0000 ug/min | INTRAMUSCULAR | Status: DC
  Administered 2013-01-09: 2 ug/min via INTRAVENOUS
  Filled 2013-01-09 (×2): qty 16

## 2013-01-09 MED ORDER — SODIUM CHLORIDE 0.9 % IV SOLN
1.0000 g | Freq: Three times a day (TID) | INTRAVENOUS | Status: AC
  Administered 2013-01-09 – 2013-01-11 (×9): 1 g via INTRAVENOUS
  Filled 2013-01-09 (×9): qty 1

## 2013-01-09 NOTE — Progress Notes (Addendum)
NUTRITION FOLLOW UP  Intervention:    Increase Osmolite 1.2 by 10 ml every 12 hours to goal rate of 40 ml/h to provide 1152 kcals, 53 gm protein, 787 ml free water daily.  Discontinue TPN per CCM physician.  Nutrition Dx:   Inadequate oral intake related to inability to eat as evidenced by NPO status. Ongoing.  Goal:   Intake to meet >90% of estimated nutrition needs, met.  Monitor:   TF tolerance/adequacy, weight trend, labs, vent status.  Assessment:   S/P tracheostomy this morning. Patient remains on ventilator support.  MV: 9.6 Temp:Temp (24hrs), Avg:97.7 F (36.5 C), Min:97.4 F (36.3 C), Max:98 F (36.7 C)  TF was initiated 4/15 via OGT with Osmolite 1.2 at 10 ml/h. Osmolite 1.2 increased to 20 ml/h 4/16, then on hold for trach placement this morning.  Per discussion in ICU rounds, patient is tolerating trophic feedings well.  Plans to increase TF slowly and D/C TPN today (discussed with TPN Pharmacist).   Patient has shown signs of refeeding syndrome with shifts in electrolytes.  Phosphorus, magnesium, and potassium are WNL today.    Height: Ht Readings from Last 1 Encounters:  01/04/13 4\' 4"  (1.321 m)    Weight Status:   Wt Readings from Last 1 Encounters:  01/09/13 98 lb 8.7 oz (44.7 kg)  01/08/13  102 lb 8.2 oz (46.5 kg) 01/03/13  98 lb 8.7 oz (44.7 kg)  01/01/13  81 lb (36.741 kg) 12/31/12  79 lb (35.834 kg)  12/25/12 72 lb 3.2 oz (32.75 kg)   Weight down slightly from yesterday's weight. Overall weight trend is upward.  Re-estimated needs:  Kcal: 1150 Protein: 50-65 gm Fluid: 1.2-1.3 L  Skin: surgical abdominal incisions  Diet Order: NPO   Intake/Output Summary (Last 24 hours) at 01/09/13 1253 Last data filed at 01/09/13 1200  Gross per 24 hour  Intake 5309.16 ml  Output   4050 ml  Net 1259.16 ml    Last BM: 4/17 (ileostomy)  Labs:   Recent Labs Lab 01/08/13 0300 01/08/13 2140 01/09/13 0420 01/09/13 0845  NA 141 143 142 143  K  3.5 2.9* 3.6 3.9  CL 104 107 105 108  CO2 29 29 29 29   BUN 22 30* 30* 32*  CREATININE 0.48* 0.53 0.50 0.53  CALCIUM 7.8* 7.7* 8.3* 8.0*  MG 1.9 2.3 2.3  --   PHOS 1.8* 2.4 2.4  --   GLUCOSE 316* 173* 227* 173*    CBG (last 3)   Recent Labs  01/09/13 0447 01/09/13 0809 01/09/13 1216  GLUCAP 186* 169* 181*    Scheduled Meds: . antiseptic oral rinse  15 mL Mouth Rinse q12n4p  . chlorhexidine  15 mL Mouth/Throat BID  . etomidate      . [START ON 01/10/2013] feeding supplement (OSMOLITE 1.2 CAL)  1,000 mL Per Tube Q24H  . fentaNYL  200 mcg Intravenous Once  . hydrocortisone sod succinate (SOLU-CORTEF) injection  50 mg Intravenous Q6H  . insulin aspart  0-15 Units Subcutaneous Q4H  . meropenem (MERREM) IV  1 g Intravenous Q8H  . multivitamin  5 mL Oral Daily  . pantoprazole sodium  40 mg Per Tube BID  . propofol  5-70 mcg/kg/min Intravenous Once  . sodium chloride  10-40 mL Intracatheter Q12H  . thiamine  100 mg Intravenous Q2000  . vancomycin  1,000 mg Intravenous Q12H  . white petrolatum        Continuous Infusions: . sodium chloride 20 mL/hr at 01/09/13 0618  .  DOPamine    . fentaNYL infusion INTRAVENOUS 300 mcg/hr (01/09/13 5621)  . midazolam (VERSED) infusion 2 mg/hr (01/09/13 0320)  . norepinephrine (LEVOPHED) Adult infusion 2 mcg/min (01/09/13 0600)  . vasopressin (PITRESSIN) infusion - *FOR SHOCK* Stopped (01/09/13 0500)    Joaquin Courts, RD, LDN, CNSC Pager [phone] After Hours Pager [phone]

## 2013-01-09 NOTE — Progress Notes (Signed)
Tracheostomy RN Procedure Note: (Late Entry)  09:32 Plts 59 0420. MD made aware. RN began transfusion of 1 unit FFP per MD order pre-tracheostomy procedure at bedside 2105. Transfusion ended 1015. No transfusion reaction. See ICU flow sheet for VS. VSS throughout transfusion.   10:30 Patient VS 129/62 (80) L arm cuff BP; HR 55 Bradycardic; Pressure on mechanical ventilator PC; Spo2 100%. Patient sedated with fentanyl infusion, versed infusion and etomidate per physician order. Dopamine gtt started and levophed gtt off. Patient's VSS stable pre-procedure start. Consent in chart verified. Time out with Dr. Radene Journey Minor NP, RT x 2 and RN x 2 at bedside. Patient VSS throughout procedure. SpO2 100% throughout procedure. Tracheostomy procedure end time 11:00. Will continue to monitor patient. Patient family updated.

## 2013-01-09 NOTE — Progress Notes (Addendum)
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 Presented 4/8 with a 24 hour history of abdomnial pain, N/V, and minimal ostomy output. She was admitted for SBO. 4/9 she became tachycardiac and hypotensive. PCCM has been asked to see 4/9  SIGNIFICANT EVENTS / STUDIES:  4/8 - Abd XRay - Small Bowel Obstruction. No peritoneal air.  4/9 - CT abd/pelvis >>> ?LLL consolidation, SBO and adhesions, no free air 4/10 - ETT for tachypnea.  4/11- OR lysis of adhesions after being febrile 4/12- Tm 103, repeat cx, add vancomycin/micafungin 4/13- Change to pressure control, lasix x 1 dose 4/15 >>self extubates, rapid reintubated 4/16 - Enteral feeding  4/16>>Echo - EF 60-65%, no WMA, normal valves 4/16>> Head CT - No acute intracranial events. Premature atrophy and chronic microvascular ischemic change.    LINES / TUBES: CVL 4/9 >>> NGT 4/9 >>> RIJ 4/9 >>> ETT 4/10 >>>4/15 self extub rapid retubed>>>  CULTURES: BC x 2 4/9 >>> Negative  Urine 4/9 >>> Negative  Repeat BC 4/13 >> NGTD  ANTIBIOTICS:  Meropenem 4/9 >>> Vancomycin 4/13 >> Micafungin 4/13 >>  SUBJECTIVE:  Remains on pressors and pressure control. She continues to be agitated and trying to pull the tube out.  VITAL SIGNS: Temp:  [97.6 F (36.4 C)-98.4 F (36.9 C)] 97.6 F (36.4 C) (04/17 0457) Pulse Rate:  [45-110] 73 (04/17 0749) Resp:  [18-24] 24 (04/17 0749) BP: (55-172)/(29-116) 99/59 mmHg (04/17 0749) SpO2:  [95 %-100 %] 100 % (04/17 0749) FiO2 (%):  [39.5 %-99.2 %] 40 % (04/17 0749) Weight:  [98 lb 8.7 oz (44.7 kg)] 98 lb 8.7 oz (44.7 kg) (04/17  0500) HEMODYNAMICS: CVP:  [1 mmHg-9 mmHg] 9 mmHg VENTILATOR SETTINGS: Vent Mode:  [-] PCV FiO2 (%):  [39.5 %-99.2 %] 40 % Set Rate:  [24 bmp] 24 bmp PEEP:  [5 cmH20] 5 cmH20 Plateau Pressure:  [23 cmH20-24 cmH20] 23 cmH20 INTAKE / OUTPUT: Intake/Output     04/16 0701 - 04/17 0700 04/17 0701 - 04/18 0700   I.V. (mL/kg) 1338.4 (29.9)    NG/GT 370    IV Piggyback 1864    TPN 920    Total Intake(mL/kg) 4492.4 (100.5)    Urine (mL/kg/hr) 5425 (5.1)    Stool 325 (0.3)    Total Output 5750     Net -1257.6            PHYSICAL EXAMINATION: General:  Petite female, acutely ill in appearance.  Neuro:  sedated HEENT: ETT, no JVD Cardiovascular:  tachy no m/r/g Lungs: upper respiratory noise.  Abdomen:  NGT, colostom , surgical dsg intact and clean. Colostomy bag has greenish appearing contents about 100cc of green appearing contents. Musculoskeletal:  Intact Skin:  Intact  LABS:  Recent Labs Lab 01/02/13 1130  01/03/13 0455  01/05/13 0500 01/05/13 0600  01/07/13 0108 01/07/13 0411 01/07/13 0500 01/07/13 0515 01/07/13 1626 01/08/13 0300 01/08/13 2140 01/09/13 0420  HGB  --   < > 7.8*  < > 11.7*  --   < >  --   --   --  11.6*  --  11.1*  --  11.1*  WBC  --   < >  1.4*  < > 13.7*  --   < >  --   --   --  15.7*  --  17.9*  --  14.8*  PLT  --   < > 84*  < > 53*  --   < >  --   --   --  40*  --  36*  --  59*  NA 143  --  144  < > 148*  --   < >  --   --  148*  --   --  141 143 142  K 3.4*  --  2.8*  < > 4.4  --   < >  --   --  3.3*  --   --  3.5 2.9* 3.6  CL 115*  --  116*  < > 118*  --   < >  --   --  110  --   --  104 107 105  CO2 15*  --  16*  < > 20  --   < >  --   --  29  --   --  29 29 29   GLUCOSE 298*  --  150*  < > 211*  --   < >  --   --  203*  --   --  316* 173* 227*  BUN 15  --  13  < > 26*  --   < >  --   --  22  --   --  22 30* 30*  CREATININE 0.70  --  0.64  < > 0.85  --   < >  --   --  0.55  --   --  0.48* 0.53 0.50  CALCIUM 6.8*  --  7.8*  < > 7.6*  --   < >   --   --  8.1*  --   --  7.8* 7.7* 8.3*  MG  --   --  2.3  --   --   --   < >  --   --  1.8  --   --  1.9 2.3 2.3  PHOS  --   --  1.8*  < > 3.8  --   < >  --   --  2.4  --   --  1.8* 2.4 2.4  AST 88*  --   --   --  306*  --   --   --   --   --   --   --  59*  --  110*  ALT 56*  --   --   --  167*  --   --   --   --   --   --   --  64*  --  116*  ALKPHOS 47  --   --   --  61  --   --   --   --   --   --   --  103  --  180*  BILITOT 0.5  --   --   --  2.8*  --   --   --   --   --   --   --  2.4*  --  2.6*  PROT 5.4*  --   --   --  4.8*  --   --   --   --   --   --   --  4.5*  --  5.4*  ALBUMIN  2.8*  --   --   --  2.2*  --   --   --   --   --   --   --  1.6*  --  2.0*  INR 1.54*  --  1.77*  < > 2.01*  --   < >  --   --  1.41  --   --  1.39  --  1.35  LATICACIDVEN 5.4*  --   --   --   --  2.4*  --   --   --   --   --   --   --   --   --   TROPONINI  --   --   --   --   --   --   --   --   --   --   --   --   --  1.07* 1.08*  PROCALCITON  --   --  46.27  --   --   --   --   --   --   --   --   --   --   --   --   PHART  --   --   --   < >  --   --   < > 7.491* 7.453*  --   --  7.423  --   --   --   PCO2ART  --   --   --   < >  --   --   < > 36.2 41.2  --   --  43.9  --   --   --   PO2ART  --   --   --   < >  --   --   < > 114.0* 78.8*  --   --  115.0*  --   --   --   < > = values in this interval not displayed.  Recent Labs Lab 01/08/13 1237 01/08/13 1516 01/08/13 1945 01/08/13 2353 01/09/13 0447  GLUCAP 196* 204* 153* 181* 186*    Imaging: Dg Chest Port 1 View  01/08/2013  *RADIOLOGY REPORT*  Clinical Data: Evaluate endotracheal tube position, on ventilator  PORTABLE CHEST - 1 VIEW  Comparison: Portable chest x-ray of 01/07/2013  Findings: There is little change in basilar atelectasis.  There may be mild pulmonary vascular congestion present.  The endotracheal tube, left Port-A-Cath, and right IJ central venous line are unchanged in position, and heart size is stable.  IMPRESSION: Little  change in mild pulmonary vascular congestion.   Original Report Authenticated By: Dwyane Dee, M.D.    Dg Chest Port 1 View  01/07/2013  *RADIOLOGY REPORT*  Clinical Data: 47 year old female endotracheal tube repositioned.  PORTABLE CHEST - 1 VIEW  Comparison: 1957 hours the same day and earlier.  Findings: AP portable semi upright view 2008 hours.  Endotracheal tube tip in good position between the level of clavicles and carina.  Stable left chest Port-A-Cath and right IJ central line.  Continued gaseous distention of the stomach.  Stable lung volumes with mild elevation of the left hemidiaphragm.  Mildly decreased left lung base opacity.  Bilateral basilar predominant increased interstitial markings.  No pneumothorax.  Cardiac size and mediastinal contours are within normal limits.  IMPRESSION: 1.  Endotracheal tube in good position. 2.  Continued gaseous distention of the stomach.  Consider NG tube decompression. 3.  Mildly improved lung volumes.  Basilar increased interstitial markings persist.  Original Report Authenticated By: Erskine Speed, M.D.    Dg Chest Port 1 View  01/07/2013  *RADIOLOGY REPORT*  Clinical Data: The endotracheal tube reinserted after the patient probably 06/25/2009.  PORTABLE CHEST - 1 VIEW 7:57 p.m.  Comparison: 01/07/2013 at 8:18 a.m.  Findings: Endotracheal tube has been reinserted and is at the origin of the right mainstem stem bronchus and needs be retracted approximately 2.5 - 3 cm.  Port-A-Cath and jugular vein catheter appear in good position.  Heart size and vascularity are normal.  Pulmonary edema has almost resolved on the right and is improved on the left.  IMPRESSION: Improving pulmonary edema.  Endotracheal tube needs to be retracted as described.   Original Report Authenticated By: Francene Boyers, M.D.    Dg Chest Port 1 View  01/07/2013  *RADIOLOGY REPORT*  Clinical Data: On ventilator  PORTABLE CHEST - 1 VIEW  Comparison: Portable exam 0818 hours compared to  01/06/2013  Findings: Tip of endotracheal tube 2.2 cm above carina. Nasogastric tube coiled in stomach. Left subclavian power port tip projects over cavoatrial junction. Right jugular central venous catheter tip projects over SVC near cavoatrial junction. Normal heart size and mediastinal contours. Diffuse bilateral pulmonary infiltrates little changed question edema versus infection. No gross pleural effusion or pneumothorax.  IMPRESSION: No interval change in diffuse bilateral pulmonary infiltrates.   Original Report Authenticated By: Ulyses Southward, M.D.    Ct Portable Head W/o Cm  01/08/2013  *RADIOLOGY REPORT*  Clinical Data: Increased agitation, not following commands, thrombocytopenia.  CT HEAD WITHOUT CONTRAST  Technique:  Contiguous axial images were obtained from the base of the skull through the vertex without contrast.  Comparison: None.  Findings: Mild atrophy, premature for the patient's age of 49. Chronic microvascular ischemic change.  No visible cerebral infarction or hemorrhage.  No midline shift.  No incipient herniation or diffuse brain edema.  Gray-white junction grossly preserved throughout.  ET tube trachea.  Calvarium intact. Retention cyst   right division sphenoid sinus.  IMPRESSION: No acute or focal intracranial abnormality.  Within normal limits for portable CT technique. Premature atrophy and chronic microvascular ischemic change.   Original Report Authenticated By: Davonna Belling, M.D.      ASSESSMENT / PLAN:  PULMONARY A: Resp Failure, probable aspiration pneumonia, ARDS, failed self extubation P:   -Repeat ABG's after reintubation: 7.42/43/115/28.7, continue current PC needs - AM pcxr - continued int changes, better aeration likely -maintain plat pressures less 30  - Awaiting trach today, likely -plan cpap 5 ps 5-10, goal 2 hrs -post trach would still need pS -allow autodiuresis  CARDIOVASCULAR A: Shock hypovolemic + septic, conts on norepi  Junctional rhythym on ecg,  Two positive troponins X 2 Meets criteria rel Ai now Echo with EF 60-65% and without WMA  P:  Titrate pressors for  MAP: > 60 mm/Hg cvp 9 - Cortisol 88 >> 17.4, on I.V hydrocortisone q50 mg 6hr Started Vasopressin. She required levo last night - would prefer dopamine with intermittent idioventricular block, start 3-5 mics likely block related to sedation, atropine at bedside, echo reviewed Lyme titer, ana, esr, c3 c4, crp Trop noted, may need cards eval, however no reg wall motion changes Post trach limit sedation  RENAL A:  Hypokalemic in setting of large NG output, hypomagnesemia and hypophosphatemia, good UOP. Na, Mg and Phos are all corrected  P:   Daily BMET Bolus with low BPs  cvp 9   GASTROINTESTINAL A:  Small Bowel Obstrucition w/ complicated surgical  bowel history. NG output ~800 mL in 24hr. Hx of colon CA, many sgy, chemo, XRT S/p exploration on 4/11 w/ lysis of adhesions \ Elevated Liver enzymes AST, ALT and Alkaline Phosphatase.? tpn? P:   -NGT to intermittent suction -4/15 - started enteral feeds per tube. Tolerating well - No indication for abdominal ct as per surgery  -PPI  -acute hep panel -d/w pharmacy LFt tpn relation, concern Dc tylenal Follow trend ast / alt in am  Dc mycofung Increase T Fas surgery feels apropriate  HEMATOLOGIC A:  Baseline microcytic anemia. Pancytopenia.  Thrombocytopenia , likely sepsis related. Fibrinogen 706. P:  -Monitor/Supportive Care -continue to hold lovenox, only received one dose,  low clinical suspcion of HITT, sent, awaited -SCD -plat Tx trach  INFECTIOUS A:  Sepsis, presumed source intra-abd.  No perforation  P:   Meropenem (4/9) >>> Cultures pending Dc mycio, se lft Repeat BC - no growth, hold off line change Will send UA - NEG  ENDOCRINE A:  Rel AI P:   -SSI Repeat cortisol - 17.4, continue steroids  NEUROLOGIC A:  Pt baseline anxiety disorder and acute Encephalopathy - in setting of shock. Head ct  scan with no acute intracranial events but notable premature atrophy and microvascular changes. P:   Sedation protocol fent Continue with versed WUA, may need head CT Reduce sedation post trach Signed:  Dow Adolph, MD PGY-1 Internal Medicine Teaching Service Pager: [phone] 01/09/2013, 7:56 AM   Ccm time independent 30 min  I have fully examined this patient and agree with above findings.    And edite din full  Mcarthur Rossetti. Tyson Alias, MD, FACP Pgr: [phone] [facility]

## 2013-01-09 NOTE — Progress Notes (Signed)
Video bronchoscopy procedure performed with trach insertion, Bronchial washing intervention performed.

## 2013-01-09 NOTE — Progress Notes (Signed)
Patient ID: Renee Nicholson, female   DOB: Jul 30, 1966, 47 y.o.   MRN: [id number] 6 Days Post-Op  Subjective: Trached today.  Remains on pressors.    Objective: Vital signs in last 24 hours: Temp:  [97.5 F (36.4 C)-98.2 F (36.8 C)] 97.8 F (36.6 C) (04/17 0945) Pulse Rate:  [45-110] 61 (04/17 0945) Resp:  [18-34] 21 (04/17 0945) BP: (73-172)/(39-116) 160/87 mmHg (04/17 0945) SpO2:  [95 %-100 %] 100 % (04/17 0749) FiO2 (%):  [39.5 %-99.2 %] 40 % (04/17 0749) Weight:  [98 lb 8.7 oz (44.7 kg)] 98 lb 8.7 oz (44.7 kg) (04/17 0500) Last BM Date: 01/08/13  Intake/Output from previous day: 04/16 0701 - 04/17 0700 In: 4492.4 [I.V.:1338.4; NG/GT:370; IV Piggyback:1864; TPN:920] Out: 5750 [Urine:5425; Stool:325] Intake/Output this shift: Total I/O In: 32.5 [I.V.:20; Blood:12.5] Out: -   General appearance: sedated Resp: coarse BS bilat Cardio: regular rate and rhythm GI: s/nd/wound c/d/i/ostomy patent, pink.  Bilious output and gas in bag.    Lab Results:   Recent Labs  01/08/13 0300 01/09/13 0420  WBC 17.9* 14.8*  HGB 11.1* 11.1*  HCT 31.1* 30.8*  PLT 36* 59*   BMET  Recent Labs  01/09/13 0420 01/09/13 0845  NA 142 143  K 3.6 3.9  CL 105 108  CO2 29 29  GLUCOSE 227* 173*  BUN 30* 32*  CREATININE 0.50 0.53  CALCIUM 8.3* 8.0*   PT/INR  Recent Labs  01/08/13 0300 01/09/13 0420  LABPROT 16.7* 16.4*  INR 1.39 1.35   ABG  Recent Labs  01/07/13 0411 01/07/13 1626  PHART 7.453* 7.423  HCO3 28.4* 28.7*    Studies/Results: Dg Chest Port 1 View  01/09/2013  *RADIOLOGY REPORT*  Clinical Data: Respiratory failure, sepsis and bowel obstruction.  PORTABLE CHEST - 1 VIEW  Comparison: 01/08/2013  Findings: Endotracheal tube tip approximately 2 cm above the carina.  Right jugular central line tip extends into the SVC. Stable appearance of left-sided Port-A-Cath.  Lungs show slight improvement in aeration and minimal residual vascular congestion without overt edema.  No  focal airspace consolidation or significant pleural fluid is identified.  Heart size is normal.  IMPRESSION: Improved aeration bilaterally.   Original Report Authenticated By: Irish Lack, M.D.    Dg Chest Port 1 View  01/08/2013  *RADIOLOGY REPORT*  Clinical Data: Evaluate endotracheal tube position, on ventilator  PORTABLE CHEST - 1 VIEW  Comparison: Portable chest x-ray of 01/07/2013  Findings: There is little change in basilar atelectasis.  There may be mild pulmonary vascular congestion present.  The endotracheal tube, left Port-A-Cath, and right IJ central venous line are unchanged in position, and heart size is stable.  IMPRESSION: Little change in mild pulmonary vascular congestion.   Original Report Authenticated By: Dwyane Dee, M.D.    Dg Chest Port 1 View  01/07/2013  *RADIOLOGY REPORT*  Clinical Data: 47 year old female endotracheal tube repositioned.  PORTABLE CHEST - 1 VIEW  Comparison: 1957 hours the same day and earlier.  Findings: AP portable semi upright view 2008 hours.  Endotracheal tube tip in good position between the level of clavicles and carina.  Stable left chest Port-A-Cath and right IJ central line.  Continued gaseous distention of the stomach.  Stable lung volumes with mild elevation of the left hemidiaphragm.  Mildly decreased left lung base opacity.  Bilateral basilar predominant increased interstitial markings.  No pneumothorax.  Cardiac size and mediastinal contours are within normal limits.  IMPRESSION: 1.  Endotracheal tube in good position. 2.  Continued  gaseous distention of the stomach.  Consider NG tube decompression. 3.  Mildly improved lung volumes.  Basilar increased interstitial markings persist.   Original Report Authenticated By: Erskine Speed, M.D.    Dg Chest Port 1 View  01/07/2013  *RADIOLOGY REPORT*  Clinical Data: The endotracheal tube reinserted after the patient probably 06/25/2009.  PORTABLE CHEST - 1 VIEW 7:57 p.m.  Comparison: 01/07/2013 at 8:18 a.m.   Findings: Endotracheal tube has been reinserted and is at the origin of the right mainstem stem bronchus and needs be retracted approximately 2.5 - 3 cm.  Port-A-Cath and jugular vein catheter appear in good position.  Heart size and vascularity are normal.  Pulmonary edema has almost resolved on the right and is improved on the left.  IMPRESSION: Improving pulmonary edema.  Endotracheal tube needs to be retracted as described.   Original Report Authenticated By: Francene Boyers, M.D.    Ct Portable Head W/o Cm  01/08/2013  *RADIOLOGY REPORT*  Clinical Data: Increased agitation, not following commands, thrombocytopenia.  CT HEAD WITHOUT CONTRAST  Technique:  Contiguous axial images were obtained from the base of the skull through the vertex without contrast.  Comparison: None.  Findings: Mild atrophy, premature for the patient's age of 63. Chronic microvascular ischemic change.  No visible cerebral infarction or hemorrhage.  No midline shift.  No incipient herniation or diffuse brain edema.  Gray-white junction grossly preserved throughout.  ET tube trachea.  Calvarium intact. Retention cyst   right division sphenoid sinus.  IMPRESSION: No acute or focal intracranial abnormality.  Within normal limits for portable CT technique. Premature atrophy and chronic microvascular ischemic change.   Original Report Authenticated By: Davonna Belling, M.D.     Anti-infectives: Anti-infectives   Start     Dose/Rate Route Frequency Ordered Stop   01/09/13 1000  vancomycin (VANCOCIN) IVPB 1000 mg/200 mL premix     1,000 mg 200 mL/hr over 60 Minutes Intravenous Every 12 hours 01/09/13 0042     01/09/13 0600  meropenem (MERREM) 1 g in sodium chloride 0.9 % 100 mL IVPB     1 g 200 mL/hr over 30 Minutes Intravenous 3 times per day 01/09/13 0058     01/09/13 0100  vancomycin (VANCOCIN) IVPB 1000 mg/200 mL premix     1,000 mg 200 mL/hr over 60 Minutes Intravenous  Once 01/09/13 0059 01/09/13 0254   01/06/13 0000  vancomycin  (VANCOCIN) IVPB 1000 mg/200 mL premix  Status:  Discontinued     1,000 mg 200 mL/hr over 60 Minutes Intravenous Every 24 hours 01/05/13 0106 01/09/13 0716   01/05/13 1000  micafungin (MYCAMINE) 100 mg in sodium chloride 0.9 % 100 mL IVPB  Status:  Discontinued     100 mg 100 mL/hr over 1 Hours Intravenous Daily 01/05/13 0030 01/09/13 0910   01/05/13 0115  vancomycin (VANCOCIN) IVPB 1000 mg/200 mL premix     1,000 mg 200 mL/hr over 60 Minutes Intravenous NOW 01/05/13 0106 01/05/13 0221   01/02/13 0100  meropenem (MERREM) 1 g in sodium chloride 0.9 % 100 mL IVPB  Status:  Discontinued     1 g 200 mL/hr over 30 Minutes Intravenous 2 times daily 01/01/13 2354 01/09/13 0058   01/01/13 1600  meropenem (MERREM) 1 g in sodium chloride 0.9 % 100 mL IVPB     1 g 200 mL/hr over 30 Minutes Intravenous STAT 01/01/13 1535 01/01/13 1640      Assessment/Plan: s/p Procedure(s): EXPLORATORY LAPAROTOMY WITH LYSIS OF ADHESIONS, revision of ileostomy (  N/A) Continue abx Would not do abd/pelvic CT scan at this time.  She is tolerating feeds, and she had no contamination of abdomen intraoperatively.   Critical care per CCM, hopefully can wean sedation now that she is trached.   Try increasing tube feeds today.   Continue TPN     LOS: 9 days    Aniyia Rane 01/09/2013

## 2013-01-09 NOTE — Progress Notes (Signed)
Shift progress note, spent approximately 30 minutes educating family about effects of ICU environment and delirium. Also discussed the medical teams desire to minimize use of benzodiazipines to prevent low blood pressures and further delirium. Family was amenable to spending more time at bedside and holding hands with patient to prevent removal of lines and tubes. Patient family  has been at bedside for most of the afternoon helping in this regard. Family has demonstrated ability and willingness to call RN for help when needed.

## 2013-01-09 NOTE — Progress Notes (Addendum)
PARENTERAL NUTRITION CONSULT NOTE - Follow Up  Pharmacy Consult:  TPN Indication: SBO  No Known Allergies  Patient Measurements: Height: 4\' 4"  (132.1 cm) Weight: 98 lb 8.7 oz (44.7 kg) IBW/kg (Calculated) : 27.1 Usual Weight: 95 lb  Vital Signs: Temp: 97.6 F (36.4 C) (04/17 0457) Temp src: Oral (04/17 0457) BP: 99/59 mmHg (04/17 0749) Pulse Rate: 73 (04/17 0749) Intake/Output from previous day: 04/16 0701 - 04/17 0700 In: 4492.4 [I.V.:1338.4; NG/GT:370; IV Piggyback:1864; TPN:920] Out: 5750 [Urine:5425; Stool:325]  Labs:  Recent Labs  01/07/13 0500 01/07/13 0515 01/08/13 0300 01/09/13 0420  WBC  --  15.7* 17.9* 14.8*  HGB  --  11.6* 11.1* 11.1*  HCT  --  32.5* 31.1* 30.8*  PLT  --  40* 36* 59*  INR 1.41  --  1.39 1.35     Recent Labs  01/08/13 0300 01/08/13 2140 01/09/13 0420  NA 141 143 142  K 3.5 2.9* 3.6  CL 104 107 105  CO2 29 29 29   GLUCOSE 316* 173* 227*  BUN 22 30* 30*  CREATININE 0.48* 0.53 0.50  CALCIUM 7.8* 7.7* 8.3*  MG 1.9 2.3 2.3  PHOS 1.8* 2.4 2.4  PROT 4.5*  --  5.4*  ALBUMIN 1.6*  --  2.0*  AST 59*  --  110*  ALT 64*  --  116*  ALKPHOS 103  --  180*  BILITOT 2.4*  --  2.6*  PREALBUMIN 9.4*  --   --   TRIG 255*  --   --   CHOL 90  --   --    Estimated Creatinine Clearance: 46.8 ml/min (by C-G formula based on Cr of 0.5).    Recent Labs  01/08/13 1945 01/08/13 2353 01/09/13 0447  GLUCAP 153* 181* 186*         Insulin Requirements in the past 24 hours:  9 units SSI  Current Nutrition:  Clinimix E 5/20 at 40 ml/hr without lipids = 844 kCal and 48 gm protein Osmolite 1.2 currently off 2nd to trach planned today, prev at 10 ml/hr = 288 kCal and 13gm protein (goal: 40 ml/hr) To inc to 20 ml/hr after trach placed today  Nutritional Goals:  Re-estimated by RD 4/16 1150  kCal, 50-65 grams of protein per day  Assessment:   Ms. 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  She also has a history of recurrent SBO.  Patient was admitted 4/8 (discharged 4/4) with the same diagnoses, now with ileus s/p LOA and ileostomy revision.  TF currently off for trach but rate to be advanced to 20 ml/hr after trach placed today.  Prealbumin 4/16 = 9.4, stable from 4/10 preab of 10.6. Low preab reflects post-op stress  GI: Noted NGT output 380 ml/24 hrs, 0/last 8 hrs. .  Stool reported (decreasing) in ileostomy. Tolerated TF at 10 ml/hr.  Endo: no hx DM, CBGs 153, 182, 186-  TF and steroids  Lytes: Na normalized s/p D5W, Phos up to 2.4 from 1.8 after Glycophos 10 mmol IV x 1 yesterday.  Mag 2.3 from 1.9 after 2 gm mag yesterday.  K up to 3.6 after 120 meq per tube  and 50 meq IV Renal: SCr remains stable. Hepatobil: LFTs/tbili elevated from baseline and rising some. Now on TPN x 9 days - could be d/t cholestasis. This can be alleviated with cyclic TPN or enteral feeding. She is not a candidate for the former as fluid loads  will be high over a shorter period of time - not ideal given her pulmonary status. TC WNL, TG elevated at 255 Neuro: fentanyl gtt, versed resumed, GCS 10, RASS -1 (goal -2) TPN Access: RIJ CVC TPN day#: 9   Plan:  - Continue Clinimix E 5/20 at 40 ml/hr, and Osmolite 1.2 - titration per MD.  TPN without lipids + TF at 10 ml/hr meets 83% of kCal and 100% of protein needs. - Multivitamin PT daily - Supplement trace elements on MWF due to ongoing national shortages - No MWF IV lipids as patient continues on tube feeding - F/U with TF advancement to start weaning TPN - F/U the addition of TF insulin coverage, hold off on adding insulin to TPN 2nd to planned rate advance of TF Herby Abraham, Pharm.D. [phone] 01/09/2013 9:27 AM

## 2013-01-09 NOTE — Care Management Note (Signed)
    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 Complicated by stricture requiring dilatation. This is now the 4th episode of small bowel obstruction     Action/Plan:   Anticipated DC Date:  01/06/2013   Anticipated DC Plan:  HOME/SELF CARE      DC Planning Services  CM consult      Choice offered to / List presented to:             Status of service:  In process, will continue to follow Medicare Important Message given?   (If response is "NO", the following Medicare IM given date fields will be blank) Date Medicare IM given:   Date Additional Medicare IM given:    Discharge Disposition:    Per UR Regulation:  Reviewed for med. necessity/level of care/duration of stay  If discussed at Long Length of Stay Meetings, dates discussed:    Comments:  ContactsMelitta, Tigue [phone]   [phone]                  Shah,Arvind Brother [phone]                 Tamela Gammon [phone]                  [facility] Daughter [phone]  01-09-13 8:15am Avie Arenas, RNBSN [phone] Remains intubated and pressors - plan for trach today.  Per insurance to be [facility] eligible, need to be trached for seven days and have three unsuccessful wean.  Insurance is in network with [facility].  01-06-13 8:30am Avie Arenas, RNBSN [phone] Now intubated and on pressors since 4-10.  Surgery on 01-03-13 for lysis of adhesions and revision of colostomy. Remains on vent and pressors - disposition depending on progression.

## 2013-01-09 NOTE — Procedures (Signed)
See full dictation Size 6 placed without complication Blood loss less 1 cc  Mcarthur Rossetti. Tyson Alias, MD, FACP Pgr: [phone]  Pulmonary & Critical Care

## 2013-01-09 NOTE — Procedures (Signed)
Bedside Tracheostomy Insertion Procedure Note   Patient Details:   Name: Renee Nicholson DOB: [birth year] MRN: [id number]  Procedure: Tracheostomy  Pre Procedure Assessment: ET Tube Size:7.5 ET Tube secured at lip (cm):19 Bite block in place: Yes Breath Sounds: Clear  Post Procedure Assessment: BP 160/87  Pulse 61  Temp(Src) 97.8 F (36.6 C) (Oral)  Resp 21  Ht 4\' 4"  (1.321 m)  Wt 98 lb 8.7 oz (44.7 kg)  BMI 25.62 kg/m2  SpO2 100%  LMP 02/20/2012 O2 sats: stable throughout Complications: No apparent complications Patient did tolerate procedure well Tracheostomy Brand:Shiley Tracheostomy Style:Cuffed Tracheostomy Size: 6.0 Tracheostomy Secured NFA:[id number] Tracheostomy Placement Confirmation:Trach cuff visualized and in place and Chest X ray ordered for placement    Leonard Downing 01/09/2013, 10:33 AM

## 2013-01-09 NOTE — Procedures (Signed)
Bronchoscopy  for Percutaneous  Tracheostomy  Name: Renee Nicholson MRN: [id number] DOB: December 09, 1965 Procedure: Bronchoscopy for Percutaneous Tracheostomy Indications: Diagnostic evaluation of the airways, Obtain specimens for culture and/or other diagnostic studies and Remove secretions In conjunction with: Dr. Tyson Alias   Procedure Details Consent: Risks of procedure as well as the alternatives and risks of each were explained to the (patient/caregiver).  Consent for procedure obtained. Time Out: Verified patient identification, verified procedure, site/side was marked, verified correct patient position, special equipment/implants available, medications/allergies/relevent history reviewed, required imaging and test results available.  Performed  In preparation for procedure, patient was given 100% FiO2 and bronchoscope lubricated. Sedation: Benzodiazepines and Etomidate  Airway entered and the following bronchi were examined: RML.   Procedures performed: Endotracheal Tube retracted in 2 cm increments. Cannulation of airway observed. Dilation observed. Placement of trachel tube  observed . No overt complications. Bronchoscope removed.    Evaluation Hemodynamic Status: BP stable throughout; O2 sats: stable throughout Patient's Current Condition: stable Specimens:  Sent purulent fluid Complications: No apparent complications Patient did tolerate procedure well.   Brett Canales Minor ACNP Adolph Pollack PCCM Pager [phone] till 3 pm If no answer page [phone] 01/09/2013, 10:09 AM  I was present for and supervised the entire procedure  Billy Fischer, MD ; [facility] service Mobile [phone].  After 5:30 PM or weekends, call [phone]

## 2013-01-10 LAB — CBC
MCH: 28.6 pg (ref 26.0–34.0)
MCHC: 34.6 g/dL (ref 30.0–36.0)
Platelets: 120 10*3/uL — ABNORMAL LOW (ref 150–400)
RDW: 15.2 % (ref 11.5–15.5)

## 2013-01-10 LAB — PREPARE PLATELET PHERESIS: Unit division: 0

## 2013-01-10 LAB — HEPARIN INDUCED THROMBOCYTOPENIA PNL
UFH High Dose UFH H: 0 % Release
UFH Low Dose 0.1 IU/mL: 1 % Release
UFH SRA Result: NEGATIVE

## 2013-01-10 LAB — PROTIME-INR
INR: 1.5 — ABNORMAL HIGH (ref 0.00–1.49)
Prothrombin Time: 17.7 seconds — ABNORMAL HIGH (ref 11.6–15.2)

## 2013-01-10 LAB — HEPATITIS PANEL, ACUTE
HCV Ab: NEGATIVE
Hep B C IgM: NEGATIVE
Hepatitis B Surface Ag: NEGATIVE

## 2013-01-10 LAB — COMPREHENSIVE METABOLIC PANEL
ALT: 142 U/L — ABNORMAL HIGH (ref 0–35)
AST: 114 U/L — ABNORMAL HIGH (ref 0–37)
Calcium: 7.9 mg/dL — ABNORMAL LOW (ref 8.4–10.5)
GFR calc Af Amer: >90 mL/min (ref >90)
Sodium: 148 mEq/L — ABNORMAL HIGH (ref 135–145)
Total Protein: 4.7 g/dL — ABNORMAL LOW (ref 6.0–8.3)

## 2013-01-10 LAB — ANA: Anti Nuclear Antibody(ANA): NEGATIVE

## 2013-01-10 LAB — GLUCOSE, CAPILLARY

## 2013-01-10 LAB — LIPASE, BLOOD: Lipase: 61 U/L — ABNORMAL HIGH (ref 11–59)

## 2013-01-10 LAB — C4 COMPLEMENT: Complement C4, Body Fluid: 25 mg/dL (ref 10–40)

## 2013-01-10 MED ORDER — DEXTROSE 5 % IV SOLN
INTRAVENOUS | Status: DC
  Administered 2013-01-10: 12:00:00 via INTRAVENOUS
  Administered 2013-01-13: 20 mL via INTRAVENOUS

## 2013-01-10 MED ORDER — LORAZEPAM 1 MG PO TABS
1.0000 mg | ORAL_TABLET | Freq: Two times a day (BID) | ORAL | Status: DC
  Administered 2013-01-10 – 2013-01-11 (×4): 1 mg
  Filled 2013-01-10 (×4): qty 1

## 2013-01-10 MED ORDER — POTASSIUM CHLORIDE 20 MEQ/15ML (10%) PO LIQD
30.0000 meq | ORAL | Status: AC
  Administered 2013-01-10 (×2): 30 meq
  Filled 2013-01-10: qty 30

## 2013-01-10 NOTE — Progress Notes (Signed)
For trach 

## 2013-01-10 NOTE — Progress Notes (Signed)
Patient ID: Renee Nicholson, female   DOB: [birth year], 47 y.o.   MRN: [id number] 7 Days Post-Op   Subjective: Sedation weaned significantly since trach.     Objective: Vital signs in last 24 hours: Temp:  [97.4 F (36.3 C)-98.3 F (36.8 C)] 98.3 F (36.8 C) (04/18 0757) Pulse Rate:  [46-81] 66 (04/18 0700) Resp:  [16-34] 24 (04/18 0700) BP: (83-179)/(50-97) 100/66 mmHg (04/18 0700) SpO2:  [99 %-100 %] 100 % (04/18 0700) FiO2 (%):  [39 %-100 %] 40 % (04/18 0751) Weight:  [98 lb 15.8 oz (44.9 kg)] 98 lb 15.8 oz (44.9 kg) (04/18 0500) Last BM Date: 01/08/13  Intake/Output from previous day: 04/17 0701 - 04/18 0700 In: 3385.8 [I.V.:1287.3; Blood:593.5; NG/GT:405; IV Piggyback:700; TPN:400] Out: 4225 [Urine:4025; Stool:200] Intake/Output this shift:    General appearance: more awake. Resp: coarse BS bilat Cardio: regular rate and rhythm GI: s/nd/wound c/d/i/ostomy patent, pink.  Bilious output and gas in bag.    Lab Results:   Recent Labs  01/09/13 0420 01/10/13 0440  WBC 14.8* 16.0*  HGB 11.1* 9.3*  HCT 30.8* 26.9*  PLT 59* 120*   BMET  Recent Labs  01/09/13 1800 01/10/13 0440  NA 150* 148*  K 3.5 3.0*  CL 112 110  CO2 32 31  GLUCOSE 106* 130*  BUN 27* 27*  CREATININE 0.45* 0.51  CALCIUM 8.4 7.9*   PT/INR  Recent Labs  01/09/13 0420 01/10/13 0440  LABPROT 16.4* 17.7*  INR 1.35 1.50*   ABG  Recent Labs  01/07/13 1626  PHART 7.423  HCO3 28.7*    Studies/Results: Chest Portable 1 View To Assess Tube Placement And Rule-out Pneumothorax  01/09/2013  *RADIOLOGY REPORT*  Clinical Data: Tube placement.  Question pneumothorax.  PORTABLE CHEST - 1 VIEW  Comparison: Single view of the chest 01/09/2013 at 7:29 a.m.  Findings: The patient's endotracheal tube has been replaced with a tracheostomy tube.  Tip of the new tube is in good position at the level of the clavicular heads.  Support tubes and lines are otherwise unchanged. There is no pneumothorax.  There  are new extensive bilateral interstitial opacities.  Patchy airspace disease in the bases, worse on the left, are increased.  Trace right pleural effusion is noted.  Heart size is upper normal.  IMPRESSION:  1.  Tracheostomy tube in good position.  No pneumothorax. 2.  New bilateral interstitial opacities most compatible with pulmonary edema.   Original Report Authenticated By: Holley Dexter, M.D.    Dg Chest Port 1 View  01/09/2013  *RADIOLOGY REPORT*  Clinical Data: Respiratory failure, sepsis and bowel obstruction.  PORTABLE CHEST - 1 VIEW  Comparison: 01/08/2013  Findings: Endotracheal tube tip approximately 2 cm above the carina.  Right jugular central line tip extends into the SVC. Stable appearance of left-sided Port-A-Cath.  Lungs show slight improvement in aeration and minimal residual vascular congestion without overt edema.  No focal airspace consolidation or significant pleural fluid is identified.  Heart size is normal.  IMPRESSION: Improved aeration bilaterally.   Original Report Authenticated By: Irish Lack, M.D.    Ct Portable Head W/o Cm  01/08/2013  *RADIOLOGY REPORT*  Clinical Data: Increased agitation, not following commands, thrombocytopenia.  CT HEAD WITHOUT CONTRAST  Technique:  Contiguous axial images were obtained from the base of the skull through the vertex without contrast.  Comparison: None.  Findings: Mild atrophy, premature for the patient's age of 30. Chronic microvascular ischemic change.  No visible cerebral infarction or hemorrhage.  No  midline shift.  No incipient herniation or diffuse brain edema.  Gray-white junction grossly preserved throughout.  ET tube trachea.  Calvarium intact. Retention cyst   right division sphenoid sinus.  IMPRESSION: No acute or focal intracranial abnormality.  Within normal limits for portable CT technique. Premature atrophy and chronic microvascular ischemic change.   Original Report Authenticated By: Davonna Belling, M.D.      Anti-infectives: Anti-infectives   Start     Dose/Rate Route Frequency Ordered Stop   01/09/13 1000  vancomycin (VANCOCIN) IVPB 1000 mg/200 mL premix     1,000 mg 200 mL/hr over 60 Minutes Intravenous Every 12 hours 01/09/13 0042     01/09/13 0600  meropenem (MERREM) 1 g in sodium chloride 0.9 % 100 mL IVPB     1 g 200 mL/hr over 30 Minutes Intravenous 3 times per day 01/09/13 0058     01/09/13 0100  vancomycin (VANCOCIN) IVPB 1000 mg/200 mL premix     1,000 mg 200 mL/hr over 60 Minutes Intravenous  Once 01/09/13 0059 01/09/13 0254   01/06/13 0000  vancomycin (VANCOCIN) IVPB 1000 mg/200 mL premix  Status:  Discontinued     1,000 mg 200 mL/hr over 60 Minutes Intravenous Every 24 hours 01/05/13 0106 01/09/13 0716   01/05/13 1000  micafungin (MYCAMINE) 100 mg in sodium chloride 0.9 % 100 mL IVPB  Status:  Discontinued     100 mg 100 mL/hr over 1 Hours Intravenous Daily 01/05/13 0030 01/09/13 0910   01/05/13 0115  vancomycin (VANCOCIN) IVPB 1000 mg/200 mL premix     1,000 mg 200 mL/hr over 60 Minutes Intravenous NOW 01/05/13 0106 01/05/13 0221   01/02/13 0100  meropenem (MERREM) 1 g in sodium chloride 0.9 % 100 mL IVPB  Status:  Discontinued     1 g 200 mL/hr over 30 Minutes Intravenous 2 times daily 01/01/13 2354 01/09/13 0058   01/01/13 1600  meropenem (MERREM) 1 g in sodium chloride 0.9 % 100 mL IVPB     1 g 200 mL/hr over 30 Minutes Intravenous STAT 01/01/13 1535 01/01/13 1640      Assessment/Plan: s/p Procedure(s): EXPLORATORY LAPAROTOMY WITH LYSIS OF ADHESIONS, revision of ileostomy (N/A) Continue abx Consider Ct scan Vent/critical care per sedation.   Increase tube feeds.   Continue TPN     LOS: 10 days    [name] 01/10/2013

## 2013-01-10 NOTE — Progress Notes (Addendum)
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 Presented 4/8 with a 24 hour history of abdomnial pain, N/V, and minimal ostomy output. She was admitted for SBO. 4/9 she became tachycardiac and hypotensive. PCCM has been asked to see 4/9  SIGNIFICANT EVENTS / STUDIES:  4/8 - Abd XRay - Small Bowel Obstruction. No peritoneal air.  4/9 - CT abd/pelvis >>> ?LLL consolidation, SBO and adhesions, no free air 4/10 - ETT for tachypnea >>>4/17 4/11- OR lysis of adhesions after being febrile 4/12- Tm 103, repeat cx, add vancomycin/micafungin 4/13- Change to pressure control, lasix x 1 dose 4/15 >>self extubates, rapid reintubated 4/16 - Enteral feeding  4/16>>Echo - EF 60-65%, no WMA, normal valves 4/16>> Head CT - No acute intracranial events. Premature atrophy and chronic microvascular ischemic change. 4/17>>>Trach, reduced sedation   LINES / TUBES: CVL 4/9 >>> NGT 4/9 >>> RIJ 4/9 >>> ETT 4/10 >>>4/15 self extub rapid retubed>>>4/17 Trach (df) 4/17>>>  CULTURES: BC x 2 4/9 >>> Negative  Urine 4/9 >>> Negative  Repeat BC 4/13 >> NGTD  ANTIBIOTICS:  Meropenem 4/9 >>> Vancomycin 4/13 >> Micafungin 4/13 >>  SUBJECTIVE:  Remains on pressors and pressure control with dopamine, low dose She is calmer today and more responsive. Able to recognize family Major progress after trach  VITAL SIGNS: Temp:  [97.4 F (36.3 C)-98.3 F (36.8 C)] 98.3 F (36.8 C) (04/18 0757) Pulse Rate:  [46-81] 66 (04/18 0700) Resp:  [16-34] 24 (04/18 0700) BP: (83-179)/(50-97) 100/66 mmHg (04/18 0700) SpO2:  [99 %-100 %] 100 % (04/18 0700) FiO2 (%):   [39 %-100 %] 40 % (04/18 0751) Weight:  [98 lb 15.8 oz (44.9 kg)] 98 lb 15.8 oz (44.9 kg) (04/18 0500) HEMODYNAMICS:   VENTILATOR SETTINGS: Vent Mode:  [-] PCV FiO2 (%):  [39 %-100 %] 40 % Set Rate:  [24 bmp] 24 bmp PEEP:  [5 cmH20] 5 cmH20 Plateau Pressure:  [21 cmH20-24 cmH20] 22 cmH20 INTAKE / OUTPUT: Intake/Output     04/17 0701 - 04/18 0700 04/18 0701 - 04/19 0700   I.V. (mL/kg) 1287.3 (28.7)    Blood 593.5    NG/GT 405    IV Piggyback 700    TPN 400    Total Intake(mL/kg) 3385.8 (75.4)    Urine (mL/kg/hr) 4025 (3.7)    Stool 200 (0.2)    Total Output 4225     Net -839.2            PHYSICAL EXAMINATION: General:  Petite female, alert and somehow responsive.  Neuro: Denies pain HEENT: trach,  no JVD Cardiovascular:  tachy no m/r/g Lungs: upper respiratory noise.  Abdomen:  NGT, colostom , surgical dsg intact and clean. Colostomy bag has greenish appearing contents. Musculoskeletal:  Intact Skin:  Intact  LABS:  Recent Labs Lab 01/05/13 0600  01/07/13 0108 01/07/13 0411  01/07/13 1626 01/08/13 0300 01/08/13 2140 01/09/13 0420 01/09/13 0845 01/09/13 1800 01/10/13 0440  HGB  --   < >  --   --   < >  --  11.1*  --  11.1*  --   --  9.3*  WBC  --   < >  --   --   < >  --  17.9*  --  14.8*  --   --  16.0*  PLT  --   < >  --   --   < >  --  36*  --  59*  --   --  120*  NA  --   < >  --   --   < >  --  141 143 142 143 150* 148*  K  --   < >  --   --   < >  --  3.5 2.9* 3.6 3.9 3.5 3.0*  CL  --   < >  --   --   < >  --  104 107 105 108 112 110  CO2  --   < >  --   --   < >  --  29 29 29 29  32 31  GLUCOSE  --   < >  --   --   < >  --  316* 173* 227* 173* 106* 130*  BUN  --   < >  --   --   < >  --  22 30* 30* 32* 27* 27*  CREATININE  --   < >  --   --   < >  --  0.48* 0.53 0.50 0.53 0.45* 0.51  CALCIUM  --   < >  --   --   < >  --  7.8* 7.7* 8.3* 8.0* 8.4 7.9*  MG  --   < >  --   --   < >  --  1.9 2.3 2.3  --   --   --   PHOS  --   < >  --   --   < >  --  1.8*  2.4 2.4  --   --  2.5  AST  --   --   --   --   --   --  59*  --  110*  --   --  114*  ALT  --   --   --   --   --   --  64*  --  116*  --   --  142*  ALKPHOS  --   --   --   --   --   --  103  --  180*  --   --  230*  BILITOT  --   --   --   --   --   --  2.4*  --  2.6*  --   --  2.9*  PROT  --   --   --   --   --   --  4.5*  --  5.4*  --   --  4.7*  ALBUMIN  --   --   --   --   --   --  1.6*  --  2.0*  --   --  2.0*  INR  --   < >  --   --   < >  --  1.39  --  1.35  --   --  1.50*  LATICACIDVEN 2.4*  --   --   --   --   --   --   --   --   --   --   --   TROPONINI  --   --   --   --   --   --   --  1.07* 1.08*  --   --   --  PHART  --   < > 7.491* 7.453*  --  7.423  --   --   --   --   --   --   PCO2ART  --   < > 36.2 41.2  --  43.9  --   --   --   --   --   --   PO2ART  --   < > 114.0* 78.8*  --  115.0*  --   --   --   --   --   --   < > = values in this interval not displayed.  Recent Labs Lab 01/09/13 1536 01/09/13 1937 01/09/13 2359 01/10/13 0409 01/10/13 0751  GLUCAP 147* 116* 168* 112* 88    Imaging: Chest Portable 1 View To Assess Tube Placement And Rule-out Pneumothorax  01/09/2013  *RADIOLOGY REPORT*  Clinical Data: Tube placement.  Question pneumothorax.  PORTABLE CHEST - 1 VIEW  Comparison: Single view of the chest 01/09/2013 at 7:29 a.m.  Findings: The patient's endotracheal tube has been replaced with a tracheostomy tube.  Tip of the new tube is in good position at the level of the clavicular heads.  Support tubes and lines are otherwise unchanged. There is no pneumothorax.  There are new extensive bilateral interstitial opacities.  Patchy airspace disease in the bases, worse on the left, are increased.  Trace right pleural effusion is noted.  Heart size is upper normal.  IMPRESSION:  1.  Tracheostomy tube in good position.  No pneumothorax. 2.  New bilateral interstitial opacities most compatible with pulmonary edema.   Original Report Authenticated By: Holley Dexter,  M.D.    Dg Chest Port 1 View  01/09/2013  *RADIOLOGY REPORT*  Clinical Data: Respiratory failure, sepsis and bowel obstruction.  PORTABLE CHEST - 1 VIEW  Comparison: 01/08/2013  Findings: Endotracheal tube tip approximately 2 cm above the carina.  Right jugular central line tip extends into the SVC. Stable appearance of left-sided Port-A-Cath.  Lungs show slight improvement in aeration and minimal residual vascular congestion without overt edema.  No focal airspace consolidation or significant pleural fluid is identified.  Heart size is normal.  IMPRESSION: Improved aeration bilaterally.   Original Report Authenticated By: Irish Lack, M.D.    Ct Portable Head W/o Cm  01/08/2013  *RADIOLOGY REPORT*  Clinical Data: Increased agitation, not following commands, thrombocytopenia.  CT HEAD WITHOUT CONTRAST  Technique:  Contiguous axial images were obtained from the base of the skull through the vertex without contrast.  Comparison: None.  Findings: Mild atrophy, premature for the patient's age of 2. Chronic microvascular ischemic change.  No visible cerebral infarction or hemorrhage.  No midline shift.  No incipient herniation or diffuse brain edema.  Gray-white junction grossly preserved throughout.  ET tube trachea.  Calvarium intact. Retention cyst   right division sphenoid sinus.  IMPRESSION: No acute or focal intracranial abnormality.  Within normal limits for portable CT technique. Premature atrophy and chronic microvascular ischemic change.   Original Report Authenticated By: Davonna Belling, M.D.      ASSESSMENT / PLAN:  PULMONARY A: Resp Failure, probable aspiration pneumonia, ARDS, trach 4/17 P:   After trach, major better neuro and resp status -continue current PC needs, cpap 5 ps 5-10, goal 2 hrs, if successful to trach collar next -maintain plat pressures less 30 further -allow autodiuresis  CARDIOVASCULAR A: Shock hypovolemic + septic, conts on norepi  Junctional rhythym on ecg, Two  positive troponins X 2 - none further (meds related>?)  Meets criteria rel Ai now Echo with EF 60-65% and without WMA  P:  Titrate pressors for  MAP: > 60 mm/Hg. On dopamine drip , goal to dc this today  cvp 9 - Cortisol 88 >> 17.4, on I.V hydrocortisone q50 mg 6hr, remain as still on steroids -CRP 4.5, ESR 35 - C3 and C4 - normal  - Lyme, ANA - pending  RENAL A:  Hypokalemic in setting of large NG output, hypomagnesemia and hypophosphatemia, good UOP. Na, Mg and Phos are all corrected Hypernatremia improved on own P:   Daily BMET  cvp 9 allow to drop Central line out >>>4/17  GASTROINTESTINAL A:  Small Bowel Obstrucition w/ complicated surgical bowel history. NG output ~800 mL in 24hr. Hx of colon CA, many sgy, chemo, XRT S/p exploration on 4/11 w/ lysis of adhesions \ Elevated Liver enzymes AST, ALT and Alkaline Phosphatase.? tpn? P:   -NGT to intermittent suction -4/15 - started enteral feeds per tube. Tolerating well -may need CT -PPI  -LFT a little worse. Micafungin and tylenol d/c 4/17 -acute hep panel - pending  - trend ast / alt daily, off tpn -Increase TF as surgery feels apropriate  HEMATOLOGIC A:  Baseline microcytic anemia. Pancytopenia.  Thrombocytopenia , likely sepsis related. Fibrinogen 706, s/p plat tx for trach P:  -Monitor/Supportive Care -continue to hold lovenox, only received one dose,  low clinical suspcion of HITT- results pending -SCD -follow plat trend further  INFECTIOUS A:  Sepsis, presumed source intra-abd.  No perforation Some clinical progress P:   Dc vancomycin Meropenem (4/9) >>>add stop date mero 4/19 Respiratory Cultures pending Repeat BC - no growth, hold off line change  ENDOCRINE A:  Rel AI P:   -SSI Repeat cortisol - 17.4, continue steroids, if off dop, taper  NEUROLOGIC A:  Pt baseline anxiety disorder and acute Encephalopathy - in setting of shock. Head ct scan with no acute intracranial events but notable premature  atrophy and microvascular changes. delirium P:   Sedation protocol fent, to int if able Add ativan to avoid WD benzo Dc versed Reduced post trach , follow commands, much improved PT consult  Signed:  Dow Adolph, MD PGY-1 Internal Medicine Teaching Service Pager: [phone] 01/10/2013, 8:21 AM   Ccm time independent 30 min  I have fully examined this patient and agree with above findings.    And edite din full  Mcarthur Rossetti. Tyson Alias, MD, FACP Pgr: [phone] [place] [facility]

## 2013-01-10 NOTE — Progress Notes (Signed)
Trach Team Note  Pt with new tracheostomy 01/09/13. Per chart, Pt still on ventilator. Will await PMSV readiness. SLP to follow chart. Harlon Ditty, MA CCC-SLP [phone]

## 2013-01-10 NOTE — Progress Notes (Signed)
[facility] ADULT ICU REPLACEMENT PROTOCOL FOR AM LAB REPLACEMENT ONLY  The patient does apply for the [facility] Adult ICU Electrolyte Replacment Protocol based on the criteria listed below:   1. Is GFR >/= 40 ml/min? yes  Patient's GFR today is >90 2. Is urine output >/= 0.5 ml/kg/hr for the last 6 hours? yes Patient's UOP is 2.8 ml/kg/hr 3. Is BUN < 60 mg/dL? yes  Patient's BUN today is 27 4. Abnormal electrolyte(s): K 3 5. Ordered repletion with: per protocol 6. If a panic level lab has been reported, has the CCM MD in charge been notified? yes.   Physician:  Dr Darrick Penna  Barnetta Chapel, [place] Ent A 01/10/2013 5:36 AM

## 2013-01-11 LAB — BASIC METABOLIC PANEL
BUN: 36 mg/dL — ABNORMAL HIGH (ref 6–23)
Chloride: 112 mEq/L (ref 96–112)
Glucose, Bld: 194 mg/dL — ABNORMAL HIGH (ref 70–99)
Potassium: 3.5 mEq/L (ref 3.5–5.1)

## 2013-01-11 LAB — CULTURE, BLOOD (ROUTINE X 2)
Culture: NO GROWTH
Culture: NO GROWTH

## 2013-01-11 LAB — GLUCOSE, CAPILLARY
Glucose-Capillary: 152 mg/dL — ABNORMAL HIGH (ref 70–99)
Glucose-Capillary: 176 mg/dL — ABNORMAL HIGH (ref 70–99)

## 2013-01-11 LAB — CBC
HCT: 28.9 % — ABNORMAL LOW (ref 36.0–46.0)
Hemoglobin: 9.8 g/dL — ABNORMAL LOW (ref 12.0–15.0)
MCHC: 33.9 g/dL (ref 30.0–36.0)
WBC: 15.1 10*3/uL — ABNORMAL HIGH (ref 4.0–10.5)

## 2013-01-11 LAB — PROTIME-INR: INR: 1.42 (ref 0.00–1.49)

## 2013-01-11 LAB — CULTURE, RESPIRATORY W GRAM STAIN

## 2013-01-11 MED ORDER — POTASSIUM CHLORIDE 20 MEQ/15ML (10%) PO LIQD
20.0000 meq | ORAL | Status: AC
  Administered 2013-01-11 (×2): 20 meq
  Filled 2013-01-11 (×2): qty 15

## 2013-01-11 MED ORDER — HYDROCORTISONE SOD SUCCINATE 100 MG IJ SOLR
25.0000 mg | Freq: Four times a day (QID) | INTRAMUSCULAR | Status: DC
  Administered 2013-01-11 – 2013-01-12 (×4): 25 mg via INTRAVENOUS
  Filled 2013-01-11 (×8): qty 0.5

## 2013-01-11 MED ORDER — FENTANYL 25 MCG/HR TD PT72
25.0000 ug | MEDICATED_PATCH | TRANSDERMAL | Status: DC
  Administered 2013-01-11: 25 ug via TRANSDERMAL
  Filled 2013-01-11 (×2): qty 1

## 2013-01-11 MED ORDER — FENTANYL CITRATE 0.05 MG/ML IJ SOLN
12.5000 ug | INTRAMUSCULAR | Status: DC | PRN
  Administered 2013-01-11 – 2013-01-12 (×2): 25 ug via INTRAVENOUS
  Administered 2013-01-16: 12.5 ug via INTRAVENOUS
  Filled 2013-01-11 (×3): qty 2

## 2013-01-11 NOTE — Evaluation (Signed)
Physical Therapy Evaluation Patient Details Name: Renee Nicholson MRN: [id number] DOB: January 06, 1966 Today's Date: 01/11/2013 Time: 4782-9562 PT Time Calculation (min): 31 min  PT Assessment / Plan / Recommendation Clinical Impression  Patient is a 47 y/o female admitted with h/o rectal cancer and total colectomy with ileostomy presented with SBO now s/p revision of ileostomy and lysis of adhesions and postop respiratory failure with probable aspiration pneumonia.  Was difficult to wean and required tracheostomy 01/07/13, she was extubated 01/10/13 and presents to PT with decreased strength, decreased balance, decreased activity tolerance with acute pain and limited mobility.  She will benefit from skilled PT in the acute setting to maximize independence and allow return home with family, but may need CIR stay prior to d/c home depending on progress.    PT Assessment  Patient needs continued PT services    Follow Up Recommendations  CIR    Does the patient have the potential to tolerate intense rehabilitation    potentially  Barriers to Discharge  None      Equipment Recommendations  Other (comment) (TBA)    Recommendations for Other Services     Frequency Min 4X/week    Precautions / Restrictions Precautions Precautions: Fall Precaution Comments: right colostomy, trach   Pertinent Vitals/Pain C/o abdominal pain with mobility      Mobility  Bed Mobility Bed Mobility: Rolling Right;Right Sidelying to Sit;Sitting - Scoot to Edge of Bed Rolling Right: 1: +2 Total assist Rolling Right: Patient Percentage: 30% Right Sidelying to Sit: 1: +2 Total assist Right Sidelying to Sit: Patient Percentage: 50% Sitting - Scoot to Edge of Bed: 3: Mod assist Details for Bed Mobility Assistance: scooted out on pad under patient Transfers Transfers: Heritage manager Transfers: 1: +2 Total assist Squat Pivot Transfers: Patient Percentage: 30% Details for Transfer Assistance: knees  buckled and pt unable to extend or take weight on her feet.  Ambulation/Gait Ambulation/Gait Assistance: Not tested (comment)        PT Diagnosis: Acute pain;Altered mental status;Generalized weakness  PT Problem List: Decreased strength;Decreased activity tolerance;Decreased balance;Decreased mobility;Cardiopulmonary status limiting activity;Pain;Decreased cognition PT Treatment Interventions: DME instruction;Gait training;Neuromuscular re-education;Balance training;Patient/family education;Functional mobility training;Therapeutic activities;Therapeutic exercise   PT Goals Acute Rehab PT Goals PT Goal Formulation: With patient/family Time For Goal Achievement: 01/25/13 Potential to Achieve Goals: Good Pt will Roll Supine to Right Side: with min assist PT Goal: Rolling Supine to Right Side - Progress: Goal set today Pt will Roll Supine to Left Side: with min assist PT Goal: Rolling Supine to Left Side - Progress: Goal set today Pt will go Supine/Side to Sit: with min assist PT Goal: Supine/Side to Sit - Progress: Goal set today Pt will Sit at Edge of Bed: with supervision;3-5 min;with unilateral upper extremity support PT Goal: Sit at Edge Of Bed - Progress: Goal set today Pt will go Sit to Supine/Side: with min assist PT Goal: Sit to Supine/Side - Progress: Goal set today Pt will go Sit to Stand: with mod assist PT Goal: Sit to Stand - Progress: Goal set today Pt will go Stand to Sit: with min assist PT Goal: Stand to Sit - Progress: Goal set today Pt will Transfer Bed to Chair/Chair to Bed: with mod assist PT Transfer Goal: Bed to Chair/Chair to Bed - Progress: Goal set today Pt will Stand: with min assist;1 - 2 min;with bilateral upper extremity support PT Goal: Stand - Progress: Goal set today Pt will Ambulate: 16 - 50 feet;with least restrictive assistive device;with  mod assist PT Goal: Ambulate - Progress: Goal set today  Visit Information  Last PT Received On:  01/11/13 Assistance Needed: +2    Subjective Data  Subjective: "Belly" mouths in response to pain Patient Stated Goal: Per spouse to go home   Prior Functioning  Home Living Lives With: Spouse Available Help at Discharge: Family;Available 24 hours/day Type of Home: House Home Access: Stairs to enter [employer] of Steps: 4 Entrance Stairs-Rails: Can reach both;Right;Left Home Layout: One level Home Adaptive Equipment: Walker - rolling;Straight cane Additional Comments: equipment used by parents in the home Prior Function Level of Independence: Independent Communication Communication: Tracheostomy    Cognition  Cognition Arousal/Alertness: Lethargic Overall Cognitive Status: Difficult to assess Difficult to assess due to: Tracheostomy    Extremity/Trunk Assessment Right Upper Extremity Assessment RUE ROM/Strength/Tone: Deficits RUE ROM/Strength/Tone Deficits: AAROM WFL, strength grossly 3-/5 Left Upper Extremity Assessment LUE ROM/Strength/Tone: Deficits LUE ROM/Strength/Tone Deficits: AAROM WFL, strength grossly 3-/5 Right Lower Extremity Assessment RLE ROM/Strength/Tone: Deficits RLE ROM/Strength/Tone Deficits: AROM WFL, strength hip flexion 3-/5, knee extension 3+/5 Left Lower Extremity Assessment LLE ROM/Strength/Tone: Deficits LLE ROM/Strength/Tone Deficits: AROM WFL, strength hip flexion 3-/5, knee extension 3+/5   Balance Balance Balance Assessed: Yes Static Sitting Balance Static Sitting - Balance Support: Bilateral upper extremity supported;Feet unsupported Static Sitting - Level of Assistance: 4: Min assist Static Sitting - Comment/# of Minutes: sat edge of bed about 1-2 minutes to check BP  End of Session PT - End of Session Activity Tolerance: Patient limited by fatigue;Patient limited by pain Patient left: in chair;with call bell/phone within reach;with family/visitor present Nurse Communication: Mobility status  GP      [facility] 01/11/2013, 3:32 PM [name], PT [phone] 01/11/2013

## 2013-01-11 NOTE — Progress Notes (Signed)
[facility] ADULT ICU REPLACEMENT PROTOCOL FOR AM LAB REPLACEMENT ONLY  The patient does apply for the [facility] Adult ICU Electrolyte Replacment Protocol based on the criteria listed below:   1. Is GFR >/= 40 ml/min? yes  Patient's GFR today is >90 2. Is urine output >/= 0.5 ml/kg/hr for the last 6 hours? yes Patient's UOP is 0.82 ml/kg/hr 3. Is BUN < 60 mg/dL? yes  Patient's BUN today is 36 4. Abnormal electrolyte(s): K 3.5 5. Ordered repletion with: see orders 6. If a panic level lab has been reported, has the CCM MD in charge been notified? yes.   Physician:  Dr. Higinio Plan, Patrisia Faeth A 01/11/2013 5:41 AM

## 2013-01-11 NOTE — Evaluation (Addendum)
[facility] - Evaluation Patient Details  Name: Renee Nicholson MRN: [id number] Date of Birth: 1965-11-14  Today's Date: 01/11/2013 Time: 8295-6213 SLP Time Calculation (min): 38 min  Past Medical History:  Past Medical History  Diagnosis Date  . Anemia   . Diarrhea   . Anxiety   . Bright red rectal bleeding 09/13/2011  . History of chemotherapy     completed 09/2011   . Hx of radiation therapy 08/29/11 to 10/09/11    rectum  . GERD (gastroesophageal reflux disease)   . Ileostomy in place   . Cancer   . Rectal cancer    Past Surgical History:  Past Surgical History  Procedure Laterality Date  . Tubal ligation    . Carpal tunnel release    . Colonoscopy  07/26/2011    Procedure: COLONOSCOPY;  Surgeon: Arlyce Harman, MD;  Location: [facility];  Service: Endoscopy;  Laterality: N/A;  10:40  . Esophagogastroduodenoscopy  07/26/2011    Procedure: ESOPHAGOGASTRODUODENOSCOPY (EGD);  Surgeon: Arlyce Harman, MD;  Location: [facility];  Service: Endoscopy;  Laterality: N/A;  . Esophagogastroduodenoscopy  12/27/2011    Procedure: ESOPHAGOGASTRODUODENOSCOPY (EGD);  Surgeon: Shirley Friar, MD;  Location: [facility];  Service: Endoscopy;  Laterality: N/A;  . Laparoscopic colon resection  12/14/11    diverting ileostomy  . Portacath placement  02/21/2012    Procedure: INSERTION PORT-A-CATH;  Surgeon: [name], MD;  Location: [facility] OR;  Service: General;  Laterality: N/A;  . Evaluation under anesthesia with anal fissurotomy N/A 11/06/2012    Procedure: Exam Under Anesthesia , Anal Dilation;  Surgeon: [name], MD;  Location: [facility] ORS;  Service: General;  Laterality: N/A;  Exam Under Anesthesia , Anal Dilation  . Laparotomy N/A 01/03/2013    Procedure: EXPLORATORY LAPAROTOMY WITH LYSIS OF ADHESIONS, revision of ileostomy;  Surgeon: [name], MD;  Location: [facility] OR;  Service: General;  Laterality: N/A;   HPI:  47 y/o female admitted with h/o rectal cancer and total  colectomy wtih ileostomy presented with wtih SBO now s/p revision of ileostomy and lysis of adhesions and postop respiratory failure with probable aspiration PNA.  Patient difficult to wean .  Intubated 4/10 to 4/17.  Tracheostomy 01/09/13.  Patient referred for PMSV evaluation.    Assessment / Plan / Recommendation Clinical Impression  PMSV evaluation complete.  Cuff deflated prior to SLP arrival. Tolerated PMSV placement for 4 trials with intervals of 4 seconds to 40 seconds.  Evidence of Co2 trapping 3 out of 4 trials.  Patient unable to redirect air to produce audible vocalization due to overall weakness and decreased breath support.  Recommend further PMSV trials with SLP only.  ST to follow in acute care setting.      SLP Assessment  Patient needs continued Speech Lanaguage Pathology Services    Follow Up Recommendations  Inpatient Rehab    Frequency and Duration min 3x week  2 weeks       SLP Goals Potential to Achieve Goals: Good SLP Goal #1: Tolerate PMSV placement up to 10 minutes with no change in vital signs or evidence of Co2 trapping with minimal verbal and visual cues to utilize speech strategies   PMSV Trial  PMSV was placed for:  briefly for intervals of 4 seconds up to 40 seconds  Able to redirect subglottic air through upper airway: No Able to Attain Phonation: No Able to Expectorate Secretions: No Level of Secretion Expectoration with PMSV: Tracheal Breath Support for Phonation: Inadequate  Respirations During Trial: 23 Pulse During Trial: 69 Behavior: Cooperative   Tracheostomy Tube  Additional Tracheostomy Tube Assessment Secretion Description: minimal blood tinged secretions  Level of Secretion Expectoration: Tracheal    Vent Dependency  Vent Dependent: No     Cuff Deflation Tolerated Cuff Deflation: Yes Length of Time for Cuff Deflation Trial: cuff deflated prior to PMSV trials.  Behavior: Cooperative;Other (comment) (lethargic)      Moreen Fowler  MS, CCC-SLP [phone] [facility] [place] 01/11/2013, 5:14 PM

## 2013-01-11 NOTE — Progress Notes (Signed)
PULMONARY  / CRITICAL CARE MEDICINE DAILY PROGRESS NOTE  Name: Renee Nicholson MRN: [id number] DOB: [birth year]    ADMISSION DATE:  12/31/2012 CONSULTATION DATE:  01/01/2013  REFERRING MD :  Donell Beers PRIMARY SERVICE: Surgery  CHIEF COMPLAINT:  Shock  BRIEF PATIENT DESCRIPTION: 47 year old female with a h/o Rectal CA, GERD, recurrent SBO, total colectomy with ileo-anal anastomosis and diverting loop ileostomy. Presented 4/8 with a 24 hour history of abdomnial pain, N/V, and minimal ostomy output. She was admitted for SBO. 4/9 she became tachycardiac and hypotensive. PCCM has been asked to see 4/9  SIGNIFICANT EVENTS / STUDIES:  4/8 - Abd XRay - Small Bowel Obstruction. No peritoneal air.  4/9 - CT abd/pelvis >>> ?LLL consolidation, SBO and adhesions, no free air 4/10 - ETT for tachypnea >>>4/17 4/11- OR lysis of adhesions after being febrile 4/12- Tm 103, repeat cx, add vancomycin/micafungin 4/13- Change to pressure control, lasix x 1 dose 4/15 >>self extubates, rapid reintubated 4/16 - Enteral feeding  4/16>>Echo - EF 60-65%, no WMA, normal valves 4/16>> Head CT - No acute intracranial events. Premature atrophy and chronic microvascular ischemic change. 4/17>>>Trach, reduced sedation 4/18- trach collar 24 hrs!!!, follows commands well   LINES / TUBES: CVL 4/9 >>>4/17 NGT 4/9 >>> ETT 4/10 >>>4/15 self extub rapid retubed>>>4/17 Trach (df) 4/17>>>  CULTURES: BC x 2 4/9 >>> Negative  Urine 4/9 >>> Negative  Repeat BC 4/13 >> NGTD  ANTIBIOTICS:  Meropenem 4/9 >>> Vancomycin 4/13 >>off Micafungin 4/13 >>off  SUBJECTIVE:  Trach collar 25 hrs !!, off pressors  VITAL SIGNS: Temp:  [97.5 F (36.4 C)-98.8 F (37.1 C)] 97.8 F (36.6 C) (04/19 0755) Pulse Rate:  [64-86] 66 (04/19 0800) Resp:  [15-25] 25 (04/19 0800) BP: (87-125)/(48-73) 111/69 mmHg (04/19 0800) SpO2:  [96 %-100 %] 100 % (04/19 0800) FiO2 (%):  [28 %] 28 % (04/19 0742) Weight:  [41.9 kg (92 lb 6 oz)] 41.9 kg (92 lb  6 oz) (04/19 0500) HEMODYNAMICS:   VENTILATOR SETTINGS: Vent Mode:  [-]  FiO2 (%):  [28 %] 28 % INTAKE / OUTPUT: Intake/Output     04/18 0701 - 04/19 0700 04/19 0701 - 04/20 0700   I.V. (mL/kg) 967 (23.1) 35 (0.8)   Blood     NG/GT 1120 75   IV Piggyback 200    TPN     Total Intake(mL/kg) 2287 (54.6) 110 (2.6)   Urine (mL/kg/hr) 1220 (1.2) 20 (0.2)   Stool 350 (0.3) 200 (1.9)   Total Output 1570 220   Net +717 -110          PHYSICAL EXAMINATION: General:  Petite female, alert no distress Neuro: Denies pain HEENT: trach clean,  no JVD Cardiovascular:  s1 s2 no m/r/g Lungs: anterior cta Abdomen:  NGT, colostom , surgical dsg intact and clean. Colostomy intact Skin:  Intact  LABS:  Recent Labs Lab 01/05/13 0600  01/07/13 0108 01/07/13 0411  01/07/13 1626 01/08/13 0300 01/08/13 2140 01/09/13 0420  01/09/13 1800 01/10/13 0440 01/11/13 0430  HGB  --   < >  --   --   < >  --  11.1*  --  11.1*  --   --  9.3* 9.8*  WBC  --   < >  --   --   < >  --  17.9*  --  14.8*  --   --  16.0* 15.1*  PLT  --   < >  --   --   < >  --  36*  --  59*  --   --  120* 145*  NA  --   < >  --   --   < >  --  141 143 142  < > 150* 148* 148*  K  --   < >  --   --   < >  --  3.5 2.9* 3.6  < > 3.5 3.0* 3.5  CL  --   < >  --   --   < >  --  104 107 105  < > 112 110 112  CO2  --   < >  --   --   < >  --  29 29 29   < > 32 31 31  GLUCOSE  --   < >  --   --   < >  --  316* 173* 227*  < > 106* 130* 194*  BUN  --   < >  --   --   < >  --  22 30* 30*  < > 27* 27* 36*  CREATININE  --   < >  --   --   < >  --  0.48* 0.53 0.50  < > 0.45* 0.51 0.62  CALCIUM  --   < >  --   --   < >  --  7.8* 7.7* 8.3*  < > 8.4 7.9* 7.8*  MG  --   < >  --   --   < >  --  1.9 2.3 2.3  --   --   --   --   PHOS  --   < >  --   --   < >  --  1.8* 2.4 2.4  --   --  2.5  --   AST  --   --   --   --   --   --  59*  --  110*  --   --  114*  --   ALT  --   --   --   --   --   --  64*  --  116*  --   --  142*  --   ALKPHOS  --    --   --   --   --   --  103  --  180*  --   --  230*  --   BILITOT  --   --   --   --   --   --  2.4*  --  2.6*  --   --  2.9*  --   PROT  --   --   --   --   --   --  4.5*  --  5.4*  --   --  4.7*  --   ALBUMIN  --   --   --   --   --   --  1.6*  --  2.0*  --   --  2.0*  --   INR  --   < >  --   --   < >  --  1.39  --  1.35  --   --  1.50* 1.42  LATICACIDVEN 2.4*  --   --   --   --   --   --   --   --   --   --   --   --   TROPONINI  --   --   --   --   --   --   --  1.07* 1.08*  --   --   --   --   PHART  --   < > 7.491* 7.453*  --  7.423  --   --   --   --   --   --   --   PCO2ART  --   < > 36.2 41.2  --  43.9  --   --   --   --   --   --   --   PO2ART  --   < > 114.0* 78.8*  --  115.0*  --   --   --   --   --   --   --   < > = values in this interval not displayed.  Recent Labs Lab 01/10/13 1556 01/10/13 1940 01/10/13 2353 01/11/13 0418 01/11/13 0727  GLUCAP 89 187* 152* 162* 106*    Imaging: Chest Portable 1 View To Assess Tube Placement And Rule-out Pneumothorax  01/09/2013  *RADIOLOGY REPORT*  Clinical Data: Tube placement.  Question pneumothorax.  PORTABLE CHEST - 1 VIEW  Comparison: Single view of the chest 01/09/2013 at 7:29 a.m.  Findings: The patient's endotracheal tube has been replaced with a tracheostomy tube.  Tip of the new tube is in good position at the level of the clavicular heads.  Support tubes and lines are otherwise unchanged. There is no pneumothorax.  There are new extensive bilateral interstitial opacities.  Patchy airspace disease in the bases, worse on the left, are increased.  Trace right pleural effusion is noted.  Heart size is upper normal.  IMPRESSION:  1.  Tracheostomy tube in good position.  No pneumothorax. 2.  New bilateral interstitial opacities most compatible with pulmonary edema.   Original Report Authenticated By: Holley Dexter, M.D.      ASSESSMENT / PLAN:  PULMONARY A: Resp Failure, probable aspiration pneumonia, ARDS, trach 4/17 P:    After trach, major better neuro and resp status -maintain trach collar further -will need slp, consider PMV -drop cuff soon -allow autodiuresis  CARDIOVASCULAR A: Shock hypovolemic + septic, conts on norepi  Junctional rhythym on ecg, Two positive troponins X 2 - none further (meds related>?)- resolved and off dop Meets criteria rel Ai now Echo with EF 60-65% and without WMA  P:  Dc cvp - Cortisol 88 >> 17.4, on I.V hydrocortisone taper slow as off pressors -CRP 4.5, ESR 35 - C3 and C4 - normal  - Lyme, ANA - pending  RENAL A:  Hypokalemic in setting of large NG output, hypomagnesemia and hypophosphatemia, good UOP. Na, Mg and Phos are all corrected Hypernatremia improved on own P:   bmet May need increase d5w , see NA  GASTROINTESTINAL A:  Small Bowel Obstrucition w/ complicated surgical bowel history. NG output ~800 mL in 24hr. Hx of colon CA, many sgy, chemo, XRT S/p exploration on 4/11 w/ lysis of adhesions \ Elevated Liver enzymes AST, ALT and Alkaline Phosphatase.? tpn? P:   -4/15 - started enteral feeds per tube. Tolerating well -PPI  -LFT a little worse- tpn likely . Micafungin and tylenol d/c 4/17- follow up in 48 hrs lft -acute hep panel - neg -Increase TF as surgery feels apropriate -SLP  HEMATOLOGIC A:  Baseline microcytic anemia. Pancytopenia.  Thrombocytopenia , likely sepsis related. Fibrinogen 706, s/p plat tx for trach P:  -Monitor/Supportive Care -continue to hold lovenox, only received one dose,  low clinical suspcion of HITT- NEG -SCD -follow plat trend further  INFECTIOUS A:  Sepsis,  presumed source intra-abd.  No perforation clinical progress P:   Meropenem (4/9) >>>add stop date mero 4/19, will d/w surgery any needs further, I do not feel she needs further  Respiratory Cultures pending  ENDOCRINE A:  Rel AI P:   -SSI Taper steroids  NEUROLOGIC A:  Pt baseline anxiety disorder and acute Encephalopathy - in setting of shock. Head ct  scan with no acute intracranial events but notable premature atrophy and microvascular changes. delirium P:   PT consult SLP PMV  Consider to sdu in am ?  Mcarthur Rossetti. Tyson Alias, MD, FACP Pgr: [phone] [facility]

## 2013-01-11 NOTE — Progress Notes (Signed)
Patient ID: Renee Nicholson, female   DOB: [birth year], 47 y.o.   MRN: [id number] 8 Days Post-Op   Subjective: Pt off pressors, sedation weaned.    Objective: Vital signs in last 24 hours: Temp:  [97.5 F (36.4 C)-98.8 F (37.1 C)] 97.5 F (36.4 C) (04/19 0420) Pulse Rate:  [63-86] 86 (04/19 0600) Resp:  [15-25] 25 (04/19 0600) BP: (87-136)/(48-79) 120/70 mmHg (04/19 0600) SpO2:  [96 %-100 %] 100 % (04/19 0600) FiO2 (%):  [28 %-40.7 %] 28 % (04/19 0300) Weight:  [92 lb 6 oz (41.9 kg)] 92 lb 6 oz (41.9 kg) (04/19 0500) Last BM Date: 01/10/13  Intake/Output from previous day: 04/18 0701 - 04/19 0700 In: 2207 [I.V.:932; NG/GT:1075; IV Piggyback:200] Out: 1570 [Urine:1220; Stool:350] Intake/Output this shift:    General appearance: more alert. Resp: coarse BS bilat Cardio: regular rate and rhythm GI: s/nd/wound c/d/i/ostomy patent, pink.  Bilious output and gas in bag.    Lab Results:   Recent Labs  01/10/13 0440 01/11/13 0430  WBC 16.0* 15.1*  HGB 9.3* 9.8*  HCT 26.9* 28.9*  PLT 120* 145*   BMET  Recent Labs  01/10/13 0440 01/11/13 0430  NA 148* 148*  K 3.0* 3.5  CL 110 112  CO2 31 31  GLUCOSE 130* 194*  BUN 27* 36*  CREATININE 0.51 0.62  CALCIUM 7.9* 7.8*   PT/INR  Recent Labs  01/10/13 0440 01/11/13 0430  LABPROT 17.7* 17.0*  INR 1.50* 1.42   ABG No results found for this basename: PHART, PCO2, PO2, HCO3,  in the last 72 hours  Studies/Results: Chest Portable 1 View To Assess Tube Placement And Rule-out Pneumothorax  01/09/2013  *RADIOLOGY REPORT*  Clinical Data: Tube placement.  Question pneumothorax.  PORTABLE CHEST - 1 VIEW  Comparison: Single view of the chest 01/09/2013 at 7:29 a.m.  Findings: The patient's endotracheal tube has been replaced with a tracheostomy tube.  Tip of the new tube is in good position at the level of the clavicular heads.  Support tubes and lines are otherwise unchanged. There is no pneumothorax.  There are new extensive  bilateral interstitial opacities.  Patchy airspace disease in the bases, worse on the left, are increased.  Trace right pleural effusion is noted.  Heart size is upper normal.  IMPRESSION:  1.  Tracheostomy tube in good position.  No pneumothorax. 2.  New bilateral interstitial opacities most compatible with pulmonary edema.   Original Report Authenticated By: Holley Dexter, M.D.    Dg Chest Port 1 View  01/09/2013  *RADIOLOGY REPORT*  Clinical Data: Respiratory failure, sepsis and bowel obstruction.  PORTABLE CHEST - 1 VIEW  Comparison: 01/08/2013  Findings: Endotracheal tube tip approximately 2 cm above the carina.  Right jugular central line tip extends into the SVC. Stable appearance of left-sided Port-A-Cath.  Lungs show slight improvement in aeration and minimal residual vascular congestion without overt edema.  No focal airspace consolidation or significant pleural fluid is identified.  Heart size is normal.  IMPRESSION: Improved aeration bilaterally.   Original Report Authenticated By: Irish Lack, M.D.     Anti-infectives: Anti-infectives   Start     Dose/Rate Route Frequency Ordered Stop   01/09/13 1000  vancomycin (VANCOCIN) IVPB 1000 mg/200 mL premix  Status:  Discontinued     1,000 mg 200 mL/hr over 60 Minutes Intravenous Every 12 hours 01/09/13 0042 01/10/13 0933   01/09/13 0600  meropenem (MERREM) 1 g in sodium chloride 0.9 % 100 mL IVPB  1 g 200 mL/hr over 30 Minutes Intravenous 3 times per day 01/09/13 0058 01/11/13 2359   01/09/13 0100  vancomycin (VANCOCIN) IVPB 1000 mg/200 mL premix     1,000 mg 200 mL/hr over 60 Minutes Intravenous  Once 01/09/13 0059 01/09/13 0254   01/06/13 0000  vancomycin (VANCOCIN) IVPB 1000 mg/200 mL premix  Status:  Discontinued     1,000 mg 200 mL/hr over 60 Minutes Intravenous Every 24 hours 01/05/13 0106 01/09/13 0716   01/05/13 1000  micafungin (MYCAMINE) 100 mg in sodium chloride 0.9 % 100 mL IVPB  Status:  Discontinued     100 mg 100  mL/hr over 1 Hours Intravenous Daily 01/05/13 0030 01/09/13 0910   01/05/13 0115  vancomycin (VANCOCIN) IVPB 1000 mg/200 mL premix     1,000 mg 200 mL/hr over 60 Minutes Intravenous NOW 01/05/13 0106 01/05/13 0221   01/02/13 0100  meropenem (MERREM) 1 g in sodium chloride 0.9 % 100 mL IVPB  Status:  Discontinued     1 g 200 mL/hr over 30 Minutes Intravenous 2 times daily 01/01/13 2354 01/09/13 0058   01/01/13 1600  meropenem (MERREM) 1 g in sodium chloride 0.9 % 100 mL IVPB     1 g 200 mL/hr over 30 Minutes Intravenous STAT 01/01/13 1535 01/01/13 1640      Assessment/Plan: s/p Procedure(s): EXPLORATORY LAPAROTOMY WITH LYSIS OF ADHESIONS, revision of ileostomy (N/A) Continue abx Vent/critical care per sedation.   Nutrition consult.  Wean TNA    LOS: 11 days    [name] 01/11/2013

## 2013-01-12 DIAGNOSIS — E46 Unspecified protein-calorie malnutrition: Secondary | ICD-10-CM

## 2013-01-12 LAB — GLUCOSE, CAPILLARY
Glucose-Capillary: 130 mg/dL — ABNORMAL HIGH (ref 70–99)
Glucose-Capillary: 155 mg/dL — ABNORMAL HIGH (ref 70–99)

## 2013-01-12 LAB — BASIC METABOLIC PANEL
CO2: 33 mEq/L — ABNORMAL HIGH (ref 19–32)
Calcium: 7.9 mg/dL — ABNORMAL LOW (ref 8.4–10.5)
Chloride: 110 mEq/L (ref 96–112)
GFR calc non Af Amer: >90 mL/min (ref >90)
Glucose, Bld: 165 mg/dL — ABNORMAL HIGH (ref 70–99)
Glucose, Bld: 131 mg/dL — ABNORMAL HIGH (ref 70–99)
Potassium: 3.5 mEq/L (ref 3.5–5.1)
Sodium: 149 mEq/L — ABNORMAL HIGH (ref 135–145)
Sodium: 140 mEq/L (ref 135–145)

## 2013-01-12 LAB — URINALYSIS, ROUTINE W REFLEX MICROSCOPIC
Glucose, UA: 100 mg/dL — AB
Specific Gravity, Urine: 1.017 (ref 1.005–1.030)
Urobilinogen, UA: 0.2 mg/dL (ref 0.0–1.0)

## 2013-01-12 LAB — CBC
Hemoglobin: 8.9 g/dL — ABNORMAL LOW (ref 12.0–15.0)
MCH: 29.1 pg (ref 26.0–34.0)
MCV: 85.9 fL (ref 78.0–100.0)
Platelets: 161 10*3/uL (ref 150–400)
RBC: 3.06 MIL/uL — ABNORMAL LOW (ref 3.87–5.11)
WBC: 11.9 10*3/uL — ABNORMAL HIGH (ref 4.0–10.5)

## 2013-01-12 LAB — URINE MICROSCOPIC-ADD ON

## 2013-01-12 LAB — PROTIME-INR: Prothrombin Time: 16.8 seconds — ABNORMAL HIGH (ref 11.6–15.2)

## 2013-01-12 MED ORDER — POTASSIUM CHLORIDE 20 MEQ/15ML (10%) PO LIQD
40.0000 meq | ORAL | Status: DC
  Filled 2013-01-12 (×3): qty 30

## 2013-01-12 MED ORDER — FREE WATER
200.0000 mL | Status: DC
  Administered 2013-01-12 – 2013-01-17 (×31): 200 mL

## 2013-01-12 MED ORDER — POTASSIUM CHLORIDE 10 MEQ/100ML IV SOLN: 10.0000 meq | INTRAVENOUS | Status: DC

## 2013-01-12 MED ORDER — SODIUM CHLORIDE 0.9 % IJ SOLN
INTRAMUSCULAR | Status: AC
  Filled 2013-01-12: qty 30

## 2013-01-12 MED ORDER — LORAZEPAM 0.5 MG PO TABS
0.5000 mg | ORAL_TABLET | Freq: Two times a day (BID) | ORAL | Status: DC
Start: 2013-01-12 — End: 2013-01-14
  Administered 2013-01-12 – 2013-01-13 (×3): 0.5 mg
  Filled 2013-01-12 (×3): qty 1

## 2013-01-12 MED ORDER — POTASSIUM CHLORIDE 10 MEQ/50ML IV SOLN
10.0000 meq | INTRAVENOUS | Status: AC
  Administered 2013-01-12 (×8): 10 meq via INTRAVENOUS
  Filled 2013-01-12: qty 250
  Filled 2013-01-12 (×3): qty 50

## 2013-01-12 MED ORDER — HYDROCORTISONE SOD SUCCINATE 100 MG IJ SOLR
25.0000 mg | Freq: Two times a day (BID) | INTRAMUSCULAR | Status: DC
  Administered 2013-01-12 – 2013-01-13 (×2): 25 mg via INTRAVENOUS
  Filled 2013-01-12 (×4): qty 0.5

## 2013-01-12 MED ORDER — ENOXAPARIN SODIUM 30 MG/0.3ML ~~LOC~~ SOLN
30.0000 mg | SUBCUTANEOUS | Status: DC
  Administered 2013-01-12 – 2013-01-17 (×6): 30 mg via SUBCUTANEOUS
  Filled 2013-01-12 (×7): qty 0.3

## 2013-01-12 MED ORDER — SIMETHICONE 40 MG/0.6ML PO SUSP
40.0000 mg | Freq: Four times a day (QID) | ORAL | Status: DC
  Administered 2013-01-12 – 2013-01-17 (×22): 40 mg via ORAL
  Filled 2013-01-12 (×28): qty 0.6

## 2013-01-12 MED ORDER — POTASSIUM CHLORIDE 20 MEQ/15ML (10%) PO LIQD
40.0000 meq | Freq: Once | ORAL | Status: AC
  Administered 2013-01-12: 40 meq
  Filled 2013-01-12: qty 30

## 2013-01-12 NOTE — Progress Notes (Signed)
eLink Physician-Brief Progress Note Patient Name: Renee Nicholson DOB: 1966/01/28 MRN: [id number]  Date of Service  01/12/2013   HPI/Events of Note   Recent Labs Lab 01/09/13 1800 01/10/13 0440 01/11/13 0430 01/12/13 0500 01/12/13 1756  K 3.5 3.0* 3.5 2.6* 3.5      eICU Interventions  kcl x 1   Intervention Category Intermediate Interventions: Electrolyte abnormality - evaluation and management  Chau Savell 01/12/2013, 8:52 PM

## 2013-01-12 NOTE — Progress Notes (Signed)
Pt arrived from 2100, VSS, (SB-SR), will continue to monitor. Family bedside.

## 2013-01-12 NOTE — Progress Notes (Signed)
RN states he already suctioned pt.

## 2013-01-12 NOTE — Progress Notes (Signed)
Patient ID: Renee Nicholson, female   DOB: Dec 24, 1965, 47 y.o.   MRN: [id number] 9 Days Post-Op   Subjective: Pt trached.  Doing better.  Still a bit disoriented.  Only tolerated 40 seconds of PMV yesterday with speech evaluation.  Blew off ostomy bag several times.    Objective: Vital signs in last 24 hours: Temp:  [98.2 F (36.8 C)-99.1 F (37.3 C)] 98.9 F (37.2 C) (04/20 0742) Pulse Rate:  [50-139] 62 (04/20 0800) Resp:  [17-33] 22 (04/20 0800) BP: (100-146)/(44-101) 115/56 mmHg (04/20 0800) SpO2:  [94 %-100 %] 99 % (04/20 0800) FiO2 (%):  [28 %] 28 % (04/20 0742) Weight:  [95 lb 7.4 oz (43.3 kg)] 95 lb 7.4 oz (43.3 kg) (04/20 0500) Last BM Date: 01/12/13  Intake/Output from previous day: 04/19 0701 - 04/20 0700 In: 2400 [I.V.:1035; NG/GT:1215; IV Piggyback:150] Out: 2405 [Urine:1110; Stool:1295] Intake/Output this shift: Total I/O In: 455 [I.V.:70; NG/GT:335; IV Piggyback:50] Out: 350 [Urine:200; Stool:150]  General appearance: more alert. Resp: breathing comfortably Cardio: regular rate and rhythm GI: s/nd/wound c/d/i/ostomy patent, pink.  Bilious output and gas in bag.    Lab Results:   Recent Labs  01/11/13 0430 01/12/13 0500  WBC 15.1* 11.9*  HGB 9.8* 8.9*  HCT 28.9* 26.3*  PLT 145* 161   BMET  Recent Labs  01/11/13 0430 01/12/13 0500  NA 148* 149*  K 3.5 2.6*  CL 112 110  CO2 31 33*  GLUCOSE 194* 165*  BUN 36* 27*  CREATININE 0.62 0.53  CALCIUM 7.8* 7.9*   PT/INR  Recent Labs  01/11/13 0430 01/12/13 0500  LABPROT 17.0* 16.8*  INR 1.42 1.40   ABG No results found for this basename: PHART, PCO2, PO2, HCO3,  in the last 72 hours  Studies/Results: No results found.  Anti-infectives: Anti-infectives   Start     Dose/Rate Route Frequency Ordered Stop   01/09/13 1000  vancomycin (VANCOCIN) IVPB 1000 mg/200 mL premix  Status:  Discontinued     1,000 mg 200 mL/hr over 60 Minutes Intravenous Every 12 hours 01/09/13 0042 01/10/13 0933   01/09/13 0600  meropenem (MERREM) 1 g in sodium chloride 0.9 % 100 mL IVPB     1 g 200 mL/hr over 30 Minutes Intravenous 3 times per day 01/09/13 0058 01/11/13 2243   01/09/13 0100  vancomycin (VANCOCIN) IVPB 1000 mg/200 mL premix     1,000 mg 200 mL/hr over 60 Minutes Intravenous  Once 01/09/13 0059 01/09/13 0254   01/06/13 0000  vancomycin (VANCOCIN) IVPB 1000 mg/200 mL premix  Status:  Discontinued     1,000 mg 200 mL/hr over 60 Minutes Intravenous Every 24 hours 01/05/13 0106 01/09/13 0716   01/05/13 1000  micafungin (MYCAMINE) 100 mg in sodium chloride 0.9 % 100 mL IVPB  Status:  Discontinued     100 mg 100 mL/hr over 1 Hours Intravenous Daily 01/05/13 0030 01/09/13 0910   01/05/13 0115  vancomycin (VANCOCIN) IVPB 1000 mg/200 mL premix     1,000 mg 200 mL/hr over 60 Minutes Intravenous NOW 01/05/13 0106 01/05/13 0221   01/02/13 0100  meropenem (MERREM) 1 g in sodium chloride 0.9 % 100 mL IVPB  Status:  Discontinued     1 g 200 mL/hr over 30 Minutes Intravenous 2 times daily 01/01/13 2354 01/09/13 0058   01/01/13 1600  meropenem (MERREM) 1 g in sodium chloride 0.9 % 100 mL IVPB     1 g 200 mL/hr over 30 Minutes Intravenous STAT 01/01/13 1535 01/01/13 1640  Assessment/Plan: s/p Procedure(s): EXPLORATORY LAPAROTOMY WITH LYSIS OF ADHESIONS, revision of ileostomy (N/A) Antibiotics off. Transfer to floor.     Speech therapy for swallowing.   Continue tube feeds for inability to take oral nutrition.    TNA off   LOS: 12 days    Ishaq Maffei 01/12/2013

## 2013-01-12 NOTE — Progress Notes (Signed)
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 Presented 4/8 with a 24 hour history of abdomnial pain, N/V, and minimal ostomy output. She was admitted for SBO. 4/9 she became tachycardiac and hypotensive. PCCM has been asked to see 4/9  SIGNIFICANT EVENTS / STUDIES:  4/8 - Abd XRay - Small Bowel Obstruction. No peritoneal air.  4/9 - CT abd/pelvis >>> ?LLL consolidation, SBO and adhesions, no free air 4/10 - ETT for tachypnea >>>4/17 4/11- OR lysis of adhesions after being febrile 4/12- Tm 103, repeat cx, add vancomycin/micafungin 4/13- Change to pressure control, lasix x 1 dose 4/15 >>self extubates, rapid reintubated 4/16 - Enteral feeding  4/16>>Echo - EF 60-65%, no WMA, normal valves 4/16>> Head CT - No acute intracranial events. Premature atrophy and chronic microvascular ischemic change. 4/17>>>Trach, reduced sedation 4/18- trach collar 24 hrs!!!, follows commands well 4/19- major progress, off vent and no pressors, alert O x 4   LINES / TUBES: CVL 4/9 >>>4/17 NGT 4/9 >>> ETT 4/10 >>>4/15 self extub rapid retubed>>>4/17 Trach (df) 4/17>>>  CULTURES: BC x 2 4/9 >>> Negative  Urine 4/9 >>> Negative  Repeat BC 4/13 >> NGTD  ANTIBIOTICS:  Meropenem 4/9 >>>19th Vancomycin 4/13 >>off Micafungin 4/13 >>off  SUBJECTIVE:  Trach collar 25 hrs !!, off pressors  VITAL SIGNS: Temp:  [98.2 F (36.8 C)-99.1 F (37.3 C)] 98.9 F (37.2 C) (04/20 0742) Pulse Rate:  [50-139] 62 (04/20 0800) Resp:  [17-33] 22 (04/20 0800) BP: (100-146)/(44-101) 115/56 mmHg (04/20 0800) SpO2:  [94 %-100 %] 99 % (04/20 0800) FiO2 (%):  [28 %]  28 % (04/20 0742) Weight:  [43.3 kg (95 lb 7.4 oz)] 43.3 kg (95 lb 7.4 oz) (04/20 0500) HEMODYNAMICS: CVP:  [8 mmHg] 8 mmHg VENTILATOR SETTINGS: Vent Mode:  [-]  FiO2 (%):  [28 %] 28 % INTAKE / OUTPUT: Intake/Output     04/19 0701 - 04/20 0700 04/20 0701 - 04/21 0700   I.V. (mL/kg) 1035 (23.9) 50 (1.2)   NG/GT 1215 45   IV Piggyback 150    Total Intake(mL/kg) 2400 (55.4) 95 (2.2)   Urine (mL/kg/hr) 1110 (1.1) 200 (2.1)   Stool 1295 (1.2)    Total Output 2405 200   Net -5 -105          PHYSICAL EXAMINATION: General:  Petite female, alert no distress, strong cough Neuro: Denies pain, nonfocal HEENT: trach clean,  no JVD Cardiovascular:  s1 s2 no m/r/g Lungs: anterior cta to bases Abdomen:  NGT, colostom , surgical dsg clean. Colostomy intact Skin:  Intact  LABS:  Recent Labs Lab 01/07/13 0108 01/07/13 0411  01/07/13 1626 01/08/13 0300 01/08/13 2140 01/09/13 0420  01/10/13 0440 01/11/13 0430 01/12/13 0500  HGB  --   --   < >  --  11.1*  --  11.1*  --  9.3* 9.8* 8.9*  WBC  --   --   < >  --  17.9*  --  14.8*  --  16.0* 15.1* 11.9*  PLT  --   --   < >  --  36*  --  59*  --  120* 145* 161  NA  --   --   < >  --  141 143 142  < > 148* 148* 149*  K  --   --   < >  --  3.5 2.9* 3.6  < > 3.0* 3.5 2.6*  CL  --   --   < >  --  104 107 105  < > 110 112 110  CO2  --   --   < >  --  29 29 29   < > 31 31 33*  GLUCOSE  --   --   < >  --  316* 173* 227*  < > 130* 194* 165*  BUN  --   --   < >  --  22 30* 30*  < > 27* 36* 27*  CREATININE  --   --   < >  --  0.48* 0.53 0.50  < > 0.51 0.62 0.53  CALCIUM  --   --   < >  --  7.8* 7.7* 8.3*  < > 7.9* 7.8* 7.9*  MG  --   --   < >  --  1.9 2.3 2.3  --   --   --   --   PHOS  --   --   < >  --  1.8* 2.4 2.4  --  2.5  --   --   AST  --   --   --   --  59*  --  110*  --  114*  --   --   ALT  --   --   --   --  64*  --  116*  --  142*  --   --   ALKPHOS  --   --   --   --  103  --  180*  --  230*  --   --   BILITOT  --   --   --   --  2.4*   --  2.6*  --  2.9*  --   --   PROT  --   --   --   --  4.5*  --  5.4*  --  4.7*  --   --   ALBUMIN  --   --   --   --  1.6*  --  2.0*  --  2.0*  --   --   INR  --   --   < >  --  1.39  --  1.35  --  1.50* 1.42 1.40  TROPONINI  --   --   --   --   --  1.07* 1.08*  --   --   --   --   PHART 7.491* 7.453*  --  7.423  --   --   --   --   --   --   --   PCO2ART 36.2 41.2  --  43.9  --   --   --   --   --   --   --   PO2ART 114.0* 78.8*  --  115.0*  --   --   --   --   --   --   --   < > = values in this interval not displayed.  Recent Labs Lab 01/11/13 1510 01/11/13 1930 01/11/13 2345 01/12/13 0352 01/12/13 0713  GLUCAP 139* 176* 130* 155* 167*    Imaging: No results found.   ASSESSMENT / PLAN:  PULMONARY A: Resp Failure, probable aspiration pneumonia, ARDS, trach 4/17 P:  Trach collar PMV start Cuff down pcxr in am for volume status  CARDIOVASCULAR A: Shock hypovolemic + septic, conts on norepi  Junctional rhythym on ecg, Two positive troponins X 2 - none further (meds related>?)- resolved and off dop Meets criteria rel Ai now Echo with EF 60-65% and without WMA  P:  - Cortisol 88 >> 17.4, on I.V hydrocortisone taper slow as off pressors, to 25 q12h today -CRP 4.5, ESR 35 - C3 and C4 - normal  - Lyme, ANA - pending Keep tele  RENAL A:  Hypokalemic in setting of large NG output, hypomagnesemia and hypophosphatemia, good UOP. Na, Mg and Phos are all corrected Hypernatremia improved on own P:   bmet after k supp Free water addition bmet in afternoon and am   GASTROINTESTINAL A:  Small Bowel Obstrucition w/ complicated surgical bowel history. NG output ~800 mL in 24hr. Hx of colon CA, many sgy, chemo, XRT S/p exploration on 4/11 w/ lysis of adhesions \ Elevated Liver enzymes AST, ALT and Alkaline Phosphatase.? tpn? P:   -PPI  -LFT a little worse- tpn likely  Will  Need follow up of this -acute hep panel - neg -TF at goal -SLP, PMV  HEMATOLOGIC A:  Baseline  microcytic anemia. Pancytopenia.  Thrombocytopenia , likely sepsis related. Fibrinogen 706, s/p plat tx for trach HITT neg, resolving plat  P:  -neg hitt, plat up again, add lovenox as h/o cancer, high risk DVT  INFECTIOUS A:  Sepsis, presumed source intra-abd.  No perforation clinical progress P:   Meropenem (4/9) >>>add stop date mero 4/19,allow to dc  ENDOCRINE A:  Rel AI P:   -SSI Taper steroids to q12h  NEUROLOGIC A:  Pt baseline anxiety disorder and acute Encephalopathy - in setting of shock. Head ct scan with no acute intracranial events but notable premature atrophy and microvascular changes. delirium P:   PT consult SLP PMV  Consider to sdu   Mcarthur Rossetti. Tyson Alias, MD, FACP Pgr: [phone] [facility]

## 2013-01-13 ENCOUNTER — Inpatient Hospital Stay (HOSPITAL_COMMUNITY): Payer: BC Managed Care – PPO

## 2013-01-13 ENCOUNTER — Ambulatory Visit: Payer: BC Managed Care – PPO | Admitting: Physical Therapy

## 2013-01-13 LAB — GLUCOSE, CAPILLARY
Glucose-Capillary: 173 mg/dL — ABNORMAL HIGH (ref 70–99)
Glucose-Capillary: 136 mg/dL — ABNORMAL HIGH (ref 70–99)
Glucose-Capillary: 117 mg/dL — ABNORMAL HIGH (ref 70–99)
Glucose-Capillary: 126 mg/dL — ABNORMAL HIGH (ref 70–99)

## 2013-01-13 LAB — BASIC METABOLIC PANEL
BUN: 18 mg/dL (ref 6–23)
BUN: 15 mg/dL (ref 6–23)
Calcium: 7.6 mg/dL — ABNORMAL LOW (ref 8.4–10.5)
Creatinine, Ser: 0.49 mg/dL — ABNORMAL LOW (ref 0.50–1.10)
Creatinine, Ser: 0.52 mg/dL (ref 0.50–1.10)
GFR calc Af Amer: >90 mL/min (ref >90)
GFR calc non Af Amer: >90 mL/min (ref >90)
GFR calc non Af Amer: >90 mL/min (ref >90)
Glucose, Bld: 148 mg/dL — ABNORMAL HIGH (ref 70–99)
Glucose, Bld: 128 mg/dL — ABNORMAL HIGH (ref 70–99)
Sodium: 138 mEq/L (ref 135–145)

## 2013-01-13 LAB — CBC
HCT: 26.7 % — ABNORMAL LOW (ref 36.0–46.0)
Hemoglobin: 9.2 g/dL — ABNORMAL LOW (ref 12.0–15.0)
MCH: 29.2 pg (ref 26.0–34.0)
MCHC: 34.5 g/dL (ref 30.0–36.0)
MCV: 84.8 fL (ref 78.0–100.0)

## 2013-01-13 LAB — PROTIME-INR: Prothrombin Time: 16 seconds — ABNORMAL HIGH (ref 11.6–15.2)

## 2013-01-13 MED ORDER — SODIUM GLYCEROPHOSPHATE 1 MMOLE/ML IV SOLN
10.0000 mmol | Freq: Once | INTRAVENOUS | Status: AC
  Administered 2013-01-13: 10 mmol via INTRAVENOUS
  Filled 2013-01-13: qty 10

## 2013-01-13 MED ORDER — POTASSIUM CHLORIDE CRYS ER 20 MEQ PO TBCR
40.0000 meq | EXTENDED_RELEASE_TABLET | Freq: Every day | ORAL | Status: DC
  Administered 2013-01-13 – 2013-01-15 (×3): 40 meq via ORAL
  Filled 2013-01-13 (×3): qty 2
  Filled 2013-01-13: qty 1
  Filled 2013-01-13: qty 2

## 2013-01-13 MED ORDER — WHITE PETROLATUM GEL
Status: AC
  Administered 2013-01-13: 0.2
  Filled 2013-01-13: qty 5

## 2013-01-13 MED ORDER — SODIUM CHLORIDE 0.9 % IV SOLN
INTRAVENOUS | Status: DC
  Administered 2013-01-13: 20 mL via INTRAVENOUS

## 2013-01-13 MED ORDER — SODIUM GLYCEROPHOSPHATE 1 MMOLE/ML IV SOLN
20.0000 mmol | Freq: Once | INTRAVENOUS | Status: AC
  Administered 2013-01-13: 20 mmol via INTRAVENOUS
  Filled 2013-01-13: qty 20

## 2013-01-13 MED ORDER — POTASSIUM CHLORIDE 10 MEQ/50ML IV SOLN
10.0000 meq | INTRAVENOUS | Status: AC
  Administered 2013-01-13 (×3): 10 meq via INTRAVENOUS
  Filled 2013-01-13: qty 100
  Filled 2013-01-13: qty 50

## 2013-01-13 MED ORDER — POTASSIUM CHLORIDE 20 MEQ/15ML (10%) PO LIQD
40.0000 meq | Freq: Once | ORAL | Status: AC
  Administered 2013-01-13: 40 meq

## 2013-01-13 MED ORDER — VITAMIN B-1 100 MG PO TABS
100.0000 mg | ORAL_TABLET | Freq: Every day | ORAL | Status: DC
  Administered 2013-01-13 – 2013-01-17 (×5): 100 mg
  Filled 2013-01-13 (×5): qty 1

## 2013-01-13 MED ORDER — SODIUM PHOSPHATE 3 MMOLE/ML IV SOLN: 30.0000 mmol | Freq: Once | INTRAVENOUS | Status: DC

## 2013-01-13 MED ORDER — DIPHENOXYLATE-ATROPINE 2.5-0.025 MG/5ML PO LIQD
5.0000 mL | Freq: Two times a day (BID) | ORAL | Status: DC
  Administered 2013-01-13 (×2): 5 mL
  Filled 2013-01-13 (×2): qty 5

## 2013-01-13 NOTE — Progress Notes (Signed)
eLink Physician-Brief Progress Note Patient Name: Moon Budde DOB: 1966/02/03 MRN: [id number]  Date of Service  01/13/2013   HPI/Events of Note     eICU Interventions  Hypokalemia -repleted    Intervention Category Intermediate Interventions: Electrolyte abnormality - evaluation and management  Scarleth Brame V. 01/13/2013, 5:58 AM

## 2013-01-13 NOTE — Evaluation (Signed)
Occupational Therapy Evaluation Patient Details Name: Renee Nicholson MRN: [id number] DOB: 1965-11-24 Today's Date: 01/13/2013 Time: 4166-0630 OT Time Calculation (min): 22 min  OT Assessment / Plan / Recommendation Clinical Impression  47 yo female with hx of rectal CA and SBO now s/p EXPLORATORY LAPAROTOMY WITH LYSIS OF ADHESIONS, revision of ileostomy. Pt with trach. Ot to follow acutely. Recommend CIR for d/c planning    OT Assessment  Patient needs continued OT Services    Follow Up Recommendations  CIR    Barriers to Discharge      Equipment Recommendations  Other (comment) (TBA)    Recommendations for Other Services    Frequency  Min 2X/week    Precautions / Restrictions Precautions Precautions: Fall Precaution Comments: right colostomy, trach   Pertinent Vitals/Pain     ADL  Toilet Transfer: +2 Total assistance Toilet Transfer: Patient Percentage: 40% Transfers/Ambulation Related to ADLs: pt shifting weight and attempting to progress ambulation this session without initiation from therapist ADL Comments: Pt requried total (A) for ostomy bag and foley prior to starting. Sister in law present translating.     OT Diagnosis: Generalized weakness;Acute pain  OT Problem List: Decreased strength;Decreased activity tolerance;Impaired balance (sitting and/or standing);Decreased safety awareness;Decreased knowledge of use of DME or AE;Decreased knowledge of precautions OT Treatment Interventions: Self-care/ADL training;Therapeutic exercise;Energy conservation;DME and/or AE instruction;Therapeutic activities;Patient/family education;Balance training   OT Goals Acute Rehab OT Goals OT Goal Formulation: With patient/family Time For Goal Achievement: 01/27/13 Potential to Achieve Goals: Good ADL Goals Pt Will Perform Grooming: with set-up;Sitting, chair;Supported ADL Goal: Grooming - Progress: Goal set today Pt Will Perform Upper Body Bathing: with set-up;Sitting,  chair;Supported ADL Goal: Upper Body Bathing - Progress: Goal set today Pt Will Perform Upper Body Dressing: Sit to stand from chair;Supported;with min assist ADL Goal: Upper Body Dressing - Progress: Goal set today Miscellaneous OT Goals Miscellaneous OT Goal #1: Pt will complete bed mobility Min (A) as precursor for adls OT Goal: Miscellaneous Goal #1 - Progress: Goal set today  Visit Information  Last OT Received On: 01/13/13 Assistance Needed: +2 PT/OT Co-Evaluation/Treatment: Yes    Subjective Data  Subjective: "yes'   Prior Functioning     Home Living Lives With: Spouse Available Help at Discharge: Family;Available 24 hours/day Type of Home: House Home Access: Stairs to enter [employer] of Steps: 4 Entrance Stairs-Rails: Can reach both;Right;Left Home Layout: One level Home Adaptive Equipment: Walker - rolling;Straight cane Additional Comments: equipment used by parents in the home Prior Function Level of Independence: Independent Communication Communication: Tracheostomy Dominant Hand: Right         Vision/Perception Vision - History Baseline Vision: No visual deficits   Cognition  Cognition Arousal/Alertness: Awake/alert Overall Cognitive Status: Difficult to assess Difficult to assess due to: Tracheostomy;Non-English speaking    Extremity/Trunk Assessment Right Upper Extremity Assessment RUE ROM/Strength/Tone: Deficits RUE ROM/Strength/Tone Deficits: AAROM WFL, strength grossly 3-/5 Left Upper Extremity Assessment LUE ROM/Strength/Tone: Deficits LUE ROM/Strength/Tone Deficits: AAROM WFL, strength grossly 3-/5     Mobility Bed Mobility Rolling Right: With rail;4: Min assist Right Sidelying to Sit: 3: Mod assist;With rails Sitting - Scoot to Edge of Bed: 2: Max assist Details for Bed Mobility Assistance: scooted out on pad under patient Transfers Sit to Stand: 1: +2 Total assist;From bed Sit to Stand: Patient Percentage: 40% Stand  to Sit: To chair/3-in-1;3: Mod assist Details for Transfer Assistance: slow to get all the way up with knees extended     Exercise General Exercises - Lower Extremity Ankle  Circles/Pumps: AROM;Both;10 reps;Supine   Balance Static Sitting Balance Static Sitting - Balance Support: Bilateral upper extremity supported;Feet unsupported Static Sitting - Level of Assistance: 4: Min assist Static Sitting - Comment/# of Minutes: min assist overall, periods of unsupport about 30 seconds, Cues for self support with feet on floor and to come anteriorly   End of Session OT - End of Session Activity Tolerance: Patient limited by fatigue Patient left: in chair;with call bell/phone within reach;with family/visitor present Nurse Communication: Mobility status;Precautions  GO     Renee Nicholson 01/13/2013, 3:54 PM Pager: [phone]

## 2013-01-13 NOTE — Progress Notes (Signed)
Physical Therapy Treatment Patient Details Name: Renee Nicholson MRN: [id number] DOB: [birth year] Today's Date: 01/13/2013 Time: 1105-1130 PT Time Calculation (min): 25 min  PT Assessment / Plan / Recommendation Comments on Treatment Session  Patient making good progress this session able to accept weight on legs and take steps in room with facilitation.  Will need course of extensive rehab prior to return home as spoke with sister in law who affirms spouse unable to provide much physical help.    Follow Up Recommendations  CIR           Equipment Recommendations  Other (comment) (TBA)       Frequency Min 4X/week   Plan Discharge plan remains appropriate    Precautions / Restrictions Precautions Precautions: Fall Precaution Comments: right colostomy, trach   Pertinent Vitals/Pain Denies pain initially    Mobility  Bed Mobility Rolling Right: With rail;4: Min assist Right Sidelying to Sit: 3: Mod assist;With rails Sitting - Scoot to Edge of Bed: 2: Max assist Details for Bed Mobility Assistance: scooted out on pad under patient Transfers Transfers: Sit to Stand;Stand to Sit Sit to Stand: 1: +2 Total assist;From bed Sit to Stand: Patient Percentage: 40% Stand to Sit: To chair/3-in-1;3: Mod assist Details for Transfer Assistance: slow to get all the way up with knees extended Ambulation/Gait Ambulation/Gait Assistance: 1: +1 Total assist;1: +2 Total assist Ambulation/Gait: Patient Percentage: 50% Ambulation Distance (Feet): 6 Feet Assistive device: 2 person hand held assist;1 person hand held assist Ambulation/Gait Assistance Details: leaning posteriorly, increased base of support, faciliatation for lateral weight shift for cue for swing and to increase foot clearance Gait Pattern: Decreased stride length;Wide base of support;Lateral trunk lean to right;Lateral trunk lean to left;Decreased hip/knee flexion - left;Decreased hip/knee flexion - right    Exercises General  Exercises - Lower Extremity Ankle Circles/Pumps: AROM;Both;10 reps;Supine     PT Goals Acute Rehab PT Goals Pt will Roll Supine to Right Side: with min assist PT Goal: Rolling Supine to Right Side - Progress: Progressing toward goal Pt will go Supine/Side to Sit: with min assist PT Goal: Supine/Side to Sit - Progress: Progressing toward goal Pt will Sit at [facility] of Bed: with supervision;3-5 min;with unilateral upper extremity support PT Goal: Sit at Edge Of Bed - Progress: Progressing toward goal Pt will go Sit to Stand: with mod assist PT Goal: Sit to Stand - Progress: Progressing toward goal Pt will go Stand to Sit: with min assist PT Goal: Stand to Sit - Progress: Progressing toward goal Pt will Stand: with min assist;1 - 2 min;with bilateral upper extremity support PT Goal: Stand - Progress: Progressing toward goal Pt will Ambulate: 16 - 50 feet;with least restrictive assistive device;with mod assist PT Goal: Ambulate - Progress: Progressing toward goal  Visit Information  Last PT Received On: 01/13/13    Subjective Data  Subjective: Nonverbal with tracheostomy; sister in law in room to translate   Cognition  Cognition Arousal/Alertness: Awake/alert Overall Cognitive Status: Difficult to assess Difficult to assess due to: Tracheostomy;Non-English speaking    [employer] operational officer Sitting Balance Static Sitting - Balance Support: Bilateral upper extremity supported;Feet unsupported Static Sitting - Level of Assistance: 4: Min assist Static Sitting - Comment/# of Minutes: min assist overall, periods of unsupport about 30 seconds,  Cues for self support with feet on floor and to come anteriorly  End of Session PT - End of Session Activity Tolerance: Patient limited by fatigue Patient left: in chair;with call bell/phone within reach;with family/visitor present  GP     [name],CYNDI 01/13/2013, 2:17 PM [name], [name] 01/13/2013

## 2013-01-13 NOTE — Clinical Social Work Psychosocial (Signed)
     Clinical Social Work Department BRIEF PSYCHOSOCIAL ASSESSMENT 01/13/2013  Patient:  Renee Nicholson, Renee Nicholson     Account Number:  [id number]     Admit date:  12/31/2012  Clinical Social Worker:  Lourdes Sledge  Date/Time:  01/13/2013 02:18 PM  Referred by:  CSW  Date Referred:  01/13/2013 Referred for  SNF Placement   Other Referral:   Interview type:  Family Other interview type:   CSW completed assessment with pt sister in law Tomasa Rand    PSYCHOSOCIAL DATA Living Status:  HUSBAND Admitted from facility:   Level of care:   Primary support name:  Jackilyn, [place] [phone] Primary support relationship to patient:  SPOUSE Degree of support available:   CSW informed that pt spouse is actively involved in pt care.    CURRENT CONCERNS Current Concerns  Post-Acute Placement   Other Concerns:    SOCIAL WORK ASSESSMENT / PLAN CSW observed that PT is recommending CIR at discharge.    CSW visited pt room where pt presents with a trach and does not verbally respond to questions. CSW introduced herself to pt sister in law who was the only person present in pt room. Pt sister confirmed that pt lives with her husband however family would like for pt to dc to CIR and ultimately return home. CSW inquired whether family would be agreeable to SNF if CIR is unable to offer placement. Pt sister in law stated pt would return home with [facility] services if CIR is unable to offer placement however would like to confirm plans as pt progresses. Sister in law stated that pt spouse would follow up with CSW tomorrow. CSW also informed by pt sister in law that pt spouse is handicap and has his own medical problems.    CSW did not receive consent to fax pt out.   Assessment/plan status:  Psychosocial Support/Ongoing Assessment of Needs Other assessment/ plan:   Information/referral to community resources:   CSW provided a SNF list and CSW contact information to pt sister in Social worker.    PATIENTS/FAMILYS  RESPONSE TO PLAN OF CARE: Pt laying in bed alert however capacity was not determined. CSW completed assessment with pt sister in law who was present in pt room. CSW will continue to follow and discuss discharge plans with pt spouse tomorrow.

## 2013-01-13 NOTE — Progress Notes (Signed)
NUTRITION FOLLOW UP  Intervention:    Continue Osmolite 1.2 at 45 ml/h to meet 100% of re-estimated nutrition needs.  Nutrition Dx:   Inadequate oral intake related to inability to eat as evidenced by NPO status. Ongoing.  Goal:   Intake to meet >90% of estimated nutrition needs, met.  Monitor:   TF tolerance/adequacy, weight trend, labs, ability to begin PO diet.  Assessment:   Patient currently on trach collar. SLP following, not ready for swallow evaluation so far. Remains NPO with TF infusing through NG tube.  Osmolite 1.2 at 45 ml/h is providing 1296 kcals, 60 gm protein, 886 ml free water daily.  Potassium and phosphorus are low today. Receiving repletion today. Per RN, is receiving Potassium repletion almost every morning.   Height: Ht Readings from Last 1 Encounters:  01/04/13 4\' 4"  (1.321 m)    Weight Status:   Wt Readings from Last 1 Encounters:  01/13/13 93 lb 11.1 oz (42.5 kg)  01/09/13  98 lb 8.7 oz (44.7 kg)  01/08/13  102 lb 8.2 oz (46.5 kg) 01/03/13  98 lb 8.7 oz (44.7 kg)  01/01/13  81 lb (36.741 kg) 12/31/12  79 lb (35.834 kg)  12/25/12 72 lb 3.2 oz (32.75 kg)   Weight continues to trend back down.  Re-estimated needs:  Kcal: 1200-1400 Protein: 55-70 gm Fluid: 1.2-1.4 L  Skin: surgical abdominal incisions  Diet Order:  NPO   Intake/Output Summary (Last 24 hours) at 01/13/13 1158 Last data filed at 01/13/13 0800  Gross per 24 hour  Intake   1475 ml  Output   3500 ml  Net  -2025 ml    Last BM: 4/21 (ileostomy)  Labs:   Recent Labs Lab 01/08/13 2140 01/09/13 0420  01/10/13 0440  01/12/13 0500 01/12/13 1756 01/13/13 0444  NA 143 142  < > 148*  < > 149* 140 138  K 2.9* 3.6  < > 3.0*  < > 2.6* 3.5 2.9*  CL 107 105  < > 110  < > 110 103 101  CO2 29 29  < > 31  < > 33* 32 32  BUN 30* 30*  < > 27*  < > 27* 21 18  CREATININE 0.53 0.50  < > 0.51  < > 0.53 0.50 0.49*  CALCIUM 7.7* 8.3*  < > 7.9*  < > 7.9* 7.9* 7.6*  MG 2.3 2.3  --    --   --   --   --  1.8  PHOS 2.4 2.4  --  2.5  --   --   --  2.1*  GLUCOSE 173* 227*  < > 130*  < > 165* 131* 148*  < > = values in this interval not displayed.  CBG (last 3)   Recent Labs  01/13/13 0007 01/13/13 0434 01/13/13 0737  GLUCAP 173* 136* 147*    Scheduled Meds: . antiseptic oral rinse  15 mL Mouth Rinse q12n4p  . chlorhexidine  15 mL Mouth/Throat BID  . diphenoxylate-atropine  5 mL Per Tube BID  . enoxaparin (LOVENOX) injection  30 mg Subcutaneous Q24H  . feeding supplement (OSMOLITE 1.2 CAL)  1,000 mL Per Tube Q24H  . fentaNYL  25 mcg Transdermal Q72H  . free water  200 mL Per Tube Q4H  . insulin aspart  0-15 Units Subcutaneous Q4H  . LORazepam  0.5 mg Per Tube BID  . multivitamin  5 mL Oral Daily  . pantoprazole sodium  40 mg Per Tube BID  .  potassium chloride  40 mEq Oral Daily  . simethicone  40 mg Oral QID  . sodium glycerophosphate 0.9% NaCl IVPB  20 mmol Intravenous Once   Followed by  . sodium glycerophosphate 0.9% NaCl IVPB  10 mmol Intravenous Once  . thiamine  100 mg Per Tube Daily    Continuous Infusions: . sodium chloride      Joaquin Courts, RD, LDN, CNSC Pager [phone] After Hours Pager [phone]

## 2013-01-13 NOTE — Consult Note (Addendum)
Ostomy follow-up: Family member at bedside states pt's pouch had leaked several times during the weekend.  Stoma red and viable, flush with skin 1 1/4 inches.  Located very close to abd wound and difficult to maintain seal until midline abd has healed and pouch can adhere to skin.Pt extubated but does not participate or ask questions. Applied barrier ring and one piece pouch.  Pouch with 50cc liquid brown stool. Supplies at bedside for staff use.  Cammie Mcgee MSN, RN, CWOCN, [place], CNS [phone]

## 2013-01-13 NOTE — Progress Notes (Signed)
Passy-Muir Speaking Valve - Treatment Patient Details  Name: Renee Nicholson MRN: [id number] Date of Birth: 08-30-1966  Today's Date: 01/13/2013 Time: 4696-2952 SLP Time Calculation (min): 20 min  Past Medical History:  Past Medical History  Diagnosis Date  . Anemia   . Diarrhea   . Anxiety   . Bright red rectal bleeding 09/13/2011  . History of chemotherapy     completed 09/2011   . Hx of radiation therapy 08/29/11 to 10/09/11    rectum  . GERD (gastroesophageal reflux disease)   . Ileostomy in place   . Cancer   . Rectal cancer    Past Surgical History:  Past Surgical History  Procedure Laterality Date  . Tubal ligation    . Carpal tunnel release    . Colonoscopy  07/26/2011    Procedure: COLONOSCOPY;  Surgeon: Arlyce Harman, MD;  Location: [facility];  Service: Endoscopy;  Laterality: N/A;  10:40  . Esophagogastroduodenoscopy  07/26/2011    Procedure: ESOPHAGOGASTRODUODENOSCOPY (EGD);  Surgeon: Arlyce Harman, MD;  Location: [facility];  Service: Endoscopy;  Laterality: N/A;  . Esophagogastroduodenoscopy  12/27/2011    Procedure: ESOPHAGOGASTRODUODENOSCOPY (EGD);  Surgeon: Shirley Friar, MD;  Location: [facility];  Service: Endoscopy;  Laterality: N/A;  . Laparoscopic colon resection  12/14/11    diverting ileostomy  . Portacath placement  02/21/2012    Procedure: INSERTION PORT-A-CATH;  Surgeon: [name], MD;  Location: [facility];  Service: General;  Laterality: N/A;  . Evaluation under anesthesia with anal fissurotomy N/A 11/06/2012    Procedure: Exam Under Anesthesia , Anal Dilation;  Surgeon: [name], MD;  Location: [facility];  Service: General;  Laterality: N/A;  Exam Under Anesthesia , Anal Dilation  . Laparotomy N/A 01/03/2013    Procedure: EXPLORATORY LAPAROTOMY WITH LYSIS OF ADHESIONS, revision of ileostomy;  Surgeon: [name], MD;  Location: [facility];  Service: General;  Laterality: N/A;    Assessment / Plan / Recommendation Clinical Impression  Skilled  treatment for trials with PMSV with pt.'s sister-in-law present.  Pt.'s cuff remains deflated.  Continued evidence of Co2 retention present with valve donned for intervals of 5-15 seconds.  Pt. either coughed valve off or SLP removed due to evidence of decreased respiratory function.  Reflexive cough inadequate to move secretion through trach or oral cavity.  Intervention included min-mod verbal cues for deep inhalation followed by verbal and demonstration cues to phonate various phonemes.  Air unable to redirect to upper ariway and no vocalization present.  Vital signs remained stable.  Pt. is very petite and may benefit from a # 4 trach (versus 6) and uncuffed for optimal valve use.  ST will continue to work with pt. to facilitate tolerance of PMSV.  Can assess swallow once pt. able to effectively tolerate PMSV.    Plan  Continue with current plan of care    Follow Up Recommendations  Inpatient Rehab    Pertinent Vitals/Pain none    SLP Goals Potential to Achieve Goals: Good SLP Goal #1: Tolerate PMSV placement up to 10 minutes with no change in vital signs or evidence of Co2 trapping with minimal verbal and visual cues to utilize speech strategies SLP Goal #1 - Progress: Progressing toward goal   PMSV Trial  PMSV was placed for: intervals of 5-15 seconds Able to redirect subglottic air through upper airway: No Able to Attain Phonation: No Able to Expectorate Secretions: No Breath Support for Phonation: Inadequate Intelligibility:  (n/a) Respirations During Trial:  (  WDL) SpO2 During Trial:  (WDL) Pulse During Trial:  (WDL)   Tracheostomy Tube       Vent Dependency  FiO2 (%): 28 %    Cuff Deflation Trial  GO     Tolerated Cuff Deflation:  (cuff was deflated upon arrival) Length of Time for Cuff Deflation Trial: cuff deflated prior to PMSV trials.  Behavior: Cooperative   Renee Nicholson.Ed [employer] [phone]  01/13/2013

## 2013-01-13 NOTE — Progress Notes (Signed)
Patient ID: Renee Nicholson, female   DOB: 10/04/1965, 48 y.o.   MRN: [id number] 10 Days Post-Op   Subjective: Mental status clearing.  Transferred to stepdown.    Objective: Vital signs in last 24 hours: Temp:  [97.9 F (36.6 C)-99.2 F (37.3 C)] 98 F (36.7 C) (04/21 0745) Pulse Rate:  [51-67] 58 (04/21 0745) Resp:  [20-30] 24 (04/21 0745) BP: (109-126)/(60-71) 122/66 mmHg (04/21 0745) SpO2:  [98 %-100 %] 99 % (04/21 0745) FiO2 (%):  [28 %] 28 % (04/21 0745) Weight:  [93 lb 11.1 oz (42.5 kg)] 93 lb 11.1 oz (42.5 kg) (04/21 0442) Last BM Date: 01/12/13  Intake/Output from previous day: 04/20 0701 - 04/21 0700 In: 2015 [I.V.:300; WU/JW:1191; IV Piggyback:150] Out: 3300 [Urine:2200; Stool:1100] Intake/Output this shift: Total I/O In: 50 [I.V.:50] Out: -   General appearance: more alert. Resp: breathing comfortably Cardio: regular rate and rhythm GI: s/nd/wound c/d/i/ostomy patent, pink.  Bilious output and gas in bag.    Lab Results:   Recent Labs  01/12/13 0500 01/13/13 0444  WBC 11.9* 9.6  HGB 8.9* 9.2*  HCT 26.3* 26.7*  PLT 161 184   BMET  Recent Labs  01/12/13 1756 01/13/13 0444  NA 140 138  K 3.5 2.9*  CL 103 101  CO2 32 32  GLUCOSE 131* 148*  BUN 21 18  CREATININE 0.50 0.49*  CALCIUM 7.9* 7.6*   PT/INR  Recent Labs  01/12/13 0500 01/13/13 0444  LABPROT 16.8* 16.0*  INR 1.40 1.31   ABG No results found for this basename: PHART, PCO2, PO2, HCO3,  in the last 72 hours  Studies/Results: Dg Chest Port 1 View  01/13/2013  *RADIOLOGY REPORT*  Clinical Data: Pulmonary edema.  ARDS.  PORTABLE CHEST - 1 VIEW  Comparison: Single view of the chest 01/09/2013.  Findings: The patient's right IJ catheter has been removed. Support apparatus is otherwise unchanged.  There are new small bilateral pleural effusions and basilar atelectasis.  Interstitial opacities do not appear notably changed.  IMPRESSION: New small bilateral pleural effusions and basilar  atelectasis.  No other change.   Original Report Authenticated By: Holley Dexter, M.D.     Anti-infectives: Anti-infectives   Start     Dose/Rate Route Frequency Ordered Stop   01/09/13 1000  vancomycin (VANCOCIN) IVPB 1000 mg/200 mL premix  Status:  Discontinued     1,000 mg 200 mL/hr over 60 Minutes Intravenous Every 12 hours 01/09/13 0042 01/10/13 0933   01/09/13 0600  meropenem (MERREM) 1 g in sodium chloride 0.9 % 100 mL IVPB     1 g 200 mL/hr over 30 Minutes Intravenous 3 times per day 01/09/13 0058 01/11/13 2243   01/09/13 0100  vancomycin (VANCOCIN) IVPB 1000 mg/200 mL premix     1,000 mg 200 mL/hr over 60 Minutes Intravenous  Once 01/09/13 0059 01/09/13 0254   01/06/13 0000  vancomycin (VANCOCIN) IVPB 1000 mg/200 mL premix  Status:  Discontinued     1,000 mg 200 mL/hr over 60 Minutes Intravenous Every 24 hours 01/05/13 0106 01/09/13 0716   01/05/13 1000  micafungin (MYCAMINE) 100 mg in sodium chloride 0.9 % 100 mL IVPB  Status:  Discontinued     100 mg 100 mL/hr over 1 Hours Intravenous Daily 01/05/13 0030 01/09/13 0910   01/05/13 0115  vancomycin (VANCOCIN) IVPB 1000 mg/200 mL premix     1,000 mg 200 mL/hr over 60 Minutes Intravenous NOW 01/05/13 0106 01/05/13 0221   01/02/13 0100  meropenem (MERREM) 1 g in  sodium chloride 0.9 % 100 mL IVPB  Status:  Discontinued     1 g 200 mL/hr over 30 Minutes Intravenous 2 times daily 01/01/13 2354 01/09/13 0058   01/01/13 1600  meropenem (MERREM) 1 g in sodium chloride 0.9 % 100 mL IVPB     1 g 200 mL/hr over 30 Minutes Intravenous STAT 01/01/13 1535 01/01/13 1640      Assessment/Plan: s/p Procedure(s): EXPLORATORY LAPAROTOMY WITH LYSIS OF ADHESIONS, revision of ileostomy (N/A) Antibiotics off.    Speech therapy for swallowing.   Continue tube feeds for inability to take oral nutrition.    TNA off Simethicone for gas. Will add lomotil for high output.    LOS: 13 days    Farheen Pfahler 01/13/2013

## 2013-01-13 NOTE — Progress Notes (Signed)
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 Presented 4/8 with a 24 hour history of abdomnial pain, N/V, and minimal ostomy output. She was admitted for SBO. 4/9 she became tachycardiac and hypotensive. PCCM has been asked to see 4/9  SIGNIFICANT EVENTS / STUDIES:  4/8 - Abd XRay - Small Bowel Obstruction. No peritoneal air.  4/9 - CT abd/pelvis >>> ?LLL consolidation, SBO and adhesions, no free air 4/10 - ETT for tachypnea >>>4/17 4/11- OR lysis of adhesions after being febrile 4/12- Tm 103, repeat cx, add vancomycin/micafungin 4/13- Change to pressure control, lasix x 1 dose 4/15-self extubates, rapid reintubated 4/16 - Enteral feeding  4/16-Echo - EF 60-65%, no WMA, normal valves 4/16-Head CT - No acute intracranial events. Premature atrophy and chronic microvascular ischemic change. 4/17-Trach, reduced sedation 4/18- trach collar 24 hrs   LINES / TUBES: Lt portacath >>  CVL 4/9 >>>4/17 NGT 4/9 >>> ETT 4/10 >>>4/15 self extub rapid retubed>>>4/17 Trach (df) 4/17>>>  CULTURES: BC x 2 4/9 >>> Negative  Urine 4/9 >>> Negative  Repeat BC 4/13 >> NGTD  ANTIBIOTICS:  Meropenem 4/9 >>> 4/19 Vancomycin 4/13 >>4/17 Micafungin 4/13 >>4/16  SUBJECTIVE:  Still has cough.  Feels weak.    VITAL SIGNS: Temp:  [97.9 F (36.6 C)-99.2 F (37.3 C)] 98 F (36.7 C) (04/21 0745) Pulse Rate:  [51-67] 58 (04/21 0745) Resp:  [20-30] 24 (04/21 0745) BP: (109-126)/(60-71) 122/66 mmHg (04/21 0745) SpO2:  [98 %-100 %] 99 % (04/21 0745) FiO2 (%):  [28 %] 28 % (04/21 0802) Weight:  [93 lb 11.1 oz (42.5 kg)] 93 lb 11.1 oz (42.5 kg)  (04/21 0442) TC 28%  INTAKE / OUTPUT: Intake/Output     04/20 0701 - 04/21 0700 04/21 0701 - 04/22 0700   I.V. (mL/kg) 300 (7.1) 50 (1.2)   NG/GT 1610 45   IV Piggyback 150    Total Intake(mL/kg) 2060 (48.5) 95 (2.2)   Urine (mL/kg/hr) 2200 (2.2) 350 (2.7)   Stool 1100 (1.1) 200 (1.5)   Total Output 3300 550   Net -1240 -455          PHYSICAL EXAMINATION: General: No distress Neuro: Alert, follows commands HEENT: trach clean Cardiovascular: regular, no murmur Lungs: scattered rhonchi Abdomen:  NGT, colostomy , surgical dsg intact and clean Skin:  No rash  LABS:  Recent Labs Lab 01/07/13 0108 01/07/13 0411  01/07/13 1626 01/08/13 0300 01/08/13 2140 01/09/13 0420  01/10/13 0440 01/11/13 0430 01/12/13 0500 01/12/13 1756 01/13/13 0444  HGB  --   --   < >  --  11.1*  --  11.1*  --  9.3* 9.8* 8.9*  --  9.2*  WBC  --   --   < >  --  17.9*  --  14.8*  --  16.0* 15.1* 11.9*  --  9.6  PLT  --   --   < >  --  36*  --  59*  --  120* 145* 161  --  184  NA  --   --   < >  --  141 143 142  < > 148* 148* 149* 140 138  K  --   --   < >  --  3.5 2.9* 3.6  < > 3.0* 3.5 2.6* 3.5 2.9*  CL  --   --   < >  --  104 107 105  < > 110 112 110 103 101  CO2  --   --   < >  --  29 29 29   < > 31 31 33* 32 32  GLUCOSE  --   --   < >  --  316* 173* 227*  < > 130* 194* 165* 131* 148*  BUN  --   --   < >  --  22 30* 30*  < > 27* 36* 27* 21 18  CREATININE  --   --   < >  --  0.48* 0.53 0.50  < > 0.51 0.62 0.53 0.50 0.49*  CALCIUM  --   --   < >  --  7.8* 7.7* 8.3*  < > 7.9* 7.8* 7.9* 7.9* 7.6*  MG  --   --   < >  --  1.9 2.3 2.3  --   --   --   --   --  1.8  PHOS  --   --   < >  --  1.8* 2.4 2.4  --  2.5  --   --   --  2.1*  AST  --   --   --   --  59*  --  110*  --  114*  --   --   --   --   ALT  --   --   --   --  64*  --  116*  --  142*  --   --   --   --   ALKPHOS  --   --   --   --  103  --  180*  --  230*  --   --   --   --   BILITOT  --   --   --   --  2.4*  --  2.6*  --  2.9*  --   --    --   --   PROT  --   --   --   --  4.5*  --  5.4*  --  4.7*  --   --   --   --   ALBUMIN  --   --   --   --  1.6*  --  2.0*  --  2.0*  --   --   --   --   INR  --   --   < >  --  1.39  --  1.35  --  1.50* 1.42 1.40  --  1.31  TROPONINI  --   --   --   --   --  1.07* 1.08*  --   --   --   --   --   --   PHART 7.491* 7.453*  --  7.423  --   --   --   --   --   --   --   --   --   PCO2ART 36.2 41.2  --  43.9  --   --   --   --   --   --   --   --   --   PO2ART 114.0* 78.8*  --  115.0*  --   --   --   --   --   --   --   --   --   < > =  values in this interval not displayed.  Recent Labs Lab 01/12/13 1738 01/12/13 2028 01/13/13 0007 01/13/13 0434 01/13/13 0737  GLUCAP 136* 115* 173* 136* 147*    Imaging: Dg Chest Port 1 View  01/13/2013  *RADIOLOGY REPORT*  Clinical Data: Pulmonary edema.  ARDS.  PORTABLE CHEST - 1 VIEW  Comparison: Single view of the chest 01/09/2013.  Findings: The patient's right IJ catheter has been removed. Support apparatus is otherwise unchanged.  There are new small bilateral pleural effusions and basilar atelectasis.  Interstitial opacities do not appear notably changed.  IMPRESSION: New small bilateral pleural effusions and basilar atelectasis.  No other change.   Original Report Authenticated By: Holley Dexter, M.D.      ASSESSMENT / PLAN:  Acute respiratory failure 2nd to aspiration PNA, pulmonary edema, and ARDS.  S/p trach 4/17 with #6 tracheostomy.  Much improved. Plan: -oxygen to keep SpO2 > 92% -continue trach until she is stronger >> may be able to downsize trach soon -continue speech valve as tolerated -even to negative fluid balance as tolerated  SBO in setting of rectal cancer. Dysphagia. Protein-calorie malnutrition. Elevated LFT's. Plan: -post-op care per CCS -continue tube feeds -speech therapy to follow up for swallow evaluation -f/u CMET 4/22 -continue protonix  Relative adrenal insufficiency. Plan: -d/c solucortef  4/21  Hypokalemia. Plan: -f/u and replace electrolytes as needed  Hyperglycemia. Plan: -continue SSI -d/c dextrose from IV fluid  Anemia of critical illness and chronic disease. Plan: -f/u CBC -continue lovenox for DVT prevention  Hx of anxiety. Pain. Plan: -continue schedule ativan, duragesic  Deconditioning. Plan: -PT recommending CIR  Resolved problems >> PNA, ARDS, Peritonitis, Septic shock, Hypernatremia, thrombocytopenia, acute encephalopathy  Updated family at bedside.  Coralyn Helling, MD [facility] Pulmonary/Critical Care 01/13/2013, 10:19 AM Pager:  [phone] After 3pm call: [phone]

## 2013-01-13 NOTE — Progress Notes (Signed)
Utilization review completed.  

## 2013-01-13 NOTE — Op Note (Signed)
[name], [name]  [id number]  MEDICAL RECORD Renee.:  [id number]  LOCATION:                                 FACILITY:  PHYSICIAN:  Nelda Bucks, MD DATE OF BIRTH:  11-23-1965  DATE OF PROCEDURE: DATE OF DISCHARGE:                              OPERATIVE REPORT   PROCEDURES:  Percutaneous tracheostomy.  PREOPERATIVE DIAGNOSIS:  Status post bowel obstruction with likely aspiration and severe ARDS with failure to extubate successfully and weaned successfully.  POSTOPERATIVE DIAGNOSIS:  Status post tracheostomy secondary to ARDS.  BRONCHOSCOPIST:  Dr. Michaela Corner.  PROCEDURE IN DETAIL:  The patient was placed in a supine position. Consent was obtained from the patient's family members, fully aware of risks and benefits of procedure including infection, bleeding, pneumothorax, and death.  Preparation used to sterilize the operative site over second and third endotracheal space.  A 1 cm vertical incision was made.  Dissection was made down the endotracheal planes.  We did identify the strap muscles through and dissected very easily with Renee vascular anomalies noted.  An 18-gauge needle was placed into the airway directly visualized by the Bronchoscopist without any posterior wall injury.  A wire was placed through the white catheter sheath and a white catheter sheath was removed.  A 14-French punch dilator was placed in the airway.  A progressive rhino dilator to approximately 27-French was placed over the glider in that area.  The glider wire remained.  A 26-French dilator system was placed through a #6 Shiley tracheostomy was placed into the airway and everything was removed except for the tracheostomy. Tracheostomy was sutured into place with 4-0 monofilament sutures. Postoperative chest x-ray revealed a well placed tracheostomy with Renee apparent complications.  Blood loss for the procedure was less than 1 mL.  Please note, the patient did require  platelet transfusions preoperatively for the procedure.  This patient can followup in our Percutaneous Tracheostomy Postoperative Clinic at any point in time, [phone], we will be located at [facility].     Nelda Bucks, MD     DJF/MEDQ  D:  01/09/2013  T:  01/10/2013  Job:  [id number]  cc:   [name], MD

## 2013-01-13 NOTE — Progress Notes (Signed)
Clinical Child psychotherapist (CSW) received a call from pt spouse informing CSW that he would be taking pt home at discharge. Pt spouse also stated that his [employer] has been updated. CSW requested for pt spouse to call the hospital billing dept however pt became upset and informed CSW that they needed to contact pt. CSW thanked spouse for his time and informed pt spouse that CSW would notify RNCM of pt spouse request. CSW signing off.  Theresia Bough, MSW, Theresia Majors [phone]

## 2013-01-14 DIAGNOSIS — J9600 Acute respiratory failure, unspecified whether with hypoxia or hypercapnia: Secondary | ICD-10-CM

## 2013-01-14 DIAGNOSIS — Z930 Tracheostomy status: Secondary | ICD-10-CM

## 2013-01-14 DIAGNOSIS — R5381 Other malaise: Secondary | ICD-10-CM

## 2013-01-14 DIAGNOSIS — K56609 Unspecified intestinal obstruction, unspecified as to partial versus complete obstruction: Secondary | ICD-10-CM

## 2013-01-14 LAB — GLUCOSE, CAPILLARY
Glucose-Capillary: 126 mg/dL — ABNORMAL HIGH (ref 70–99)
Glucose-Capillary: 126 mg/dL — ABNORMAL HIGH (ref 70–99)
Glucose-Capillary: 129 mg/dL — ABNORMAL HIGH (ref 70–99)
Glucose-Capillary: 149 mg/dL — ABNORMAL HIGH (ref 70–99)
Glucose-Capillary: 99 mg/dL (ref 70–99)

## 2013-01-14 LAB — CBC
HCT: 28.9 % — ABNORMAL LOW (ref 36.0–46.0)
MCH: 29 pg (ref 26.0–34.0)
MCV: 83.8 fL (ref 78.0–100.0)
Platelets: 223 10*3/uL (ref 150–400)
RBC: 3.45 MIL/uL — ABNORMAL LOW (ref 3.87–5.11)
WBC: 8.7 10*3/uL (ref 4.0–10.5)

## 2013-01-14 LAB — COMPREHENSIVE METABOLIC PANEL
AST: 41 U/L — ABNORMAL HIGH (ref 0–37)
BUN: 12 mg/dL (ref 6–23)
CO2: 33 mEq/L — ABNORMAL HIGH (ref 19–32)
Calcium: 7.4 mg/dL — ABNORMAL LOW (ref 8.4–10.5)
Chloride: 98 mEq/L (ref 96–112)
Creatinine, Ser: 0.46 mg/dL — ABNORMAL LOW (ref 0.50–1.10)
GFR calc Af Amer: >90 mL/min (ref >90)
GFR calc non Af Amer: >90 mL/min (ref >90)
Total Bilirubin: 1 mg/dL (ref 0.3–1.2)

## 2013-01-14 MED ORDER — DIPHENOXYLATE-ATROPINE 2.5-0.025 MG/5ML PO LIQD
10.0000 mL | Freq: Four times a day (QID) | ORAL | Status: DC
  Administered 2013-01-14 (×4): 10 mL
  Filled 2013-01-14 (×2): qty 5
  Filled 2013-01-14: qty 10
  Filled 2013-01-14: qty 5
  Filled 2013-01-14: qty 10

## 2013-01-14 MED ORDER — LORAZEPAM 0.5 MG PO TABS
0.5000 mg | ORAL_TABLET | Freq: Every day | ORAL | Status: DC
  Filled 2013-01-14: qty 1

## 2013-01-14 MED ORDER — PANTOPRAZOLE SODIUM 40 MG PO PACK
40.0000 mg | PACK | Freq: Every day | ORAL | Status: DC
  Administered 2013-01-14 – 2013-01-16 (×3): 40 mg
  Filled 2013-01-14 (×3): qty 20

## 2013-01-14 MED ORDER — POTASSIUM CHLORIDE 20 MEQ/15ML (10%) PO LIQD
40.0000 meq | ORAL | Status: AC
  Administered 2013-01-14 (×2): 40 meq
  Filled 2013-01-14: qty 30

## 2013-01-14 MED ORDER — POTASSIUM CHLORIDE 20 MEQ/15ML (10%) PO LIQD
40.0000 meq | Freq: Once | ORAL | Status: AC
  Administered 2013-01-14: 40 meq
  Filled 2013-01-14: qty 30

## 2013-01-14 MED ORDER — MORPHINE SULFATE 10 MG/5ML PO SOLN
2.5000 mg | Freq: Three times a day (TID) | ORAL | Status: AC
  Administered 2013-01-14 (×3): 2.5 mg
  Filled 2013-01-14 (×3): qty 5

## 2013-01-14 MED ORDER — POTASSIUM CHLORIDE 20 MEQ/15ML (10%) PO LIQD
ORAL | Status: AC
  Filled 2013-01-14: qty 30

## 2013-01-14 NOTE — Progress Notes (Signed)
Patient ID: Renee Nicholson, female   DOB: Oct 29, 1965, 47 y.o.   MRN: [id number] 11 Days Post-Op   Subjective: Mental status continues to clear.  Still a bit disoriented at times.    Objective: Vital signs in last 24 hours: Temp:  [98.1 F (36.7 C)-99 F (37.2 C)] 98.3 F (36.8 C) (04/22 0400) Pulse Rate:  [59-72] 69 (04/22 0400) Resp:  [22-26] 23 (04/22 0400) BP: (111-136)/(64-82) 116/72 mmHg (04/22 0400) SpO2:  [99 %-100 %] 99 % (04/22 0400) FiO2 (%):  [28 %] 28 % (04/22 0400) Weight:  [94 lb 5.7 oz (42.8 kg)] 94 lb 5.7 oz (42.8 kg) (04/22 0433) Last BM Date: 01/13/13  Intake/Output from previous day: 04/21 0701 - 04/22 0700 In: 1885 [I.V.:430; NG/GT:1455] Out: 4900 [Urine:3100; Stool:1800] Intake/Output this shift:    General appearance: more alert. Resp: breathing comfortably Cardio: regular rate and rhythm GI: s/nd/wound c/d/i/ostomy patent, pink.  Thin output in bag.    Lab Results:   Recent Labs  01/13/13 0444 01/14/13 0400  WBC 9.6 8.7  HGB 9.2* 10.0*  HCT 26.7* 28.9*  PLT 184 223   BMET  Recent Labs  01/13/13 1644 01/14/13 0400  NA 134* 135  K 3.2* 2.7*  CL 97 98  CO2 31 33*  GLUCOSE 128* 134*  BUN 15 12  CREATININE 0.52 0.46*  CALCIUM 7.9* 7.4*   PT/INR  Recent Labs  01/12/13 0500 01/13/13 0444  LABPROT 16.8* 16.0*  INR 1.40 1.31   ABG No results found for this basename: PHART, PCO2, PO2, HCO3,  in the last 72 hours  Studies/Results: Dg Chest Port 1 View  01/13/2013  *RADIOLOGY REPORT*  Clinical Data: Pulmonary edema.  ARDS.  PORTABLE CHEST - 1 VIEW  Comparison: Single view of the chest 01/09/2013.  Findings: The patient's right IJ catheter has been removed. Support apparatus is otherwise unchanged.  There are new small bilateral pleural effusions and basilar atelectasis.  Interstitial opacities do not appear notably changed.  IMPRESSION: New small bilateral pleural effusions and basilar atelectasis.  No other change.   Original Report  Authenticated By: Holley Dexter, M.D.     Anti-infectives: Anti-infectives   Start     Dose/Rate Route Frequency Ordered Stop   01/09/13 1000  vancomycin (VANCOCIN) IVPB 1000 mg/200 mL premix  Status:  Discontinued     1,000 mg 200 mL/hr over 60 Minutes Intravenous Every 12 hours 01/09/13 0042 01/10/13 0933   01/09/13 0600  meropenem (MERREM) 1 g in sodium chloride 0.9 % 100 mL IVPB     1 g 200 mL/hr over 30 Minutes Intravenous 3 times per day 01/09/13 0058 01/11/13 2243   01/09/13 0100  vancomycin (VANCOCIN) IVPB 1000 mg/200 mL premix     1,000 mg 200 mL/hr over 60 Minutes Intravenous  Once 01/09/13 0059 01/09/13 0254   01/06/13 0000  vancomycin (VANCOCIN) IVPB 1000 mg/200 mL premix  Status:  Discontinued     1,000 mg 200 mL/hr over 60 Minutes Intravenous Every 24 hours 01/05/13 0106 01/09/13 0716   01/05/13 1000  micafungin (MYCAMINE) 100 mg in sodium chloride 0.9 % 100 mL IVPB  Status:  Discontinued     100 mg 100 mL/hr over 1 Hours Intravenous Daily 01/05/13 0030 01/09/13 0910   01/05/13 0115  vancomycin (VANCOCIN) IVPB 1000 mg/200 mL premix     1,000 mg 200 mL/hr over 60 Minutes Intravenous NOW 01/05/13 0106 01/05/13 0221   01/02/13 0100  meropenem (MERREM) 1 g in sodium chloride 0.9 % 100  mL IVPB  Status:  Discontinued     1 g 200 mL/hr over 30 Minutes Intravenous 2 times daily 01/01/13 2354 01/09/13 0058   01/01/13 1600  meropenem (MERREM) 1 g in sodium chloride 0.9 % 100 mL IVPB     1 g 200 mL/hr over 30 Minutes Intravenous STAT 01/01/13 1535 01/01/13 1640      Assessment/Plan: s/p Procedure(s): EXPLORATORY LAPAROTOMY WITH LYSIS OF ADHESIONS, revision of ileostomy (N/A) Antibiotics off.    Speech therapy for swallowing.   OT for ADL asst PT consult for mobility. Continue tube feeds for inability to take oral nutrition.    TNA off Simethicone for gas. Increase lomotil for high output ileostomy.    LOS: 14 days    Lorenzo Pereyra 01/14/2013

## 2013-01-14 NOTE — Progress Notes (Signed)
Pt unable to tolerate the change to RA. Pt placed back on 28% oxygen ATC and is tolerating well at this time. RT Will monitor.

## 2013-01-14 NOTE — Progress Notes (Signed)
eLink Physician-Brief Progress Note Patient Name: Renee Nicholson DOB: 1966/09/07 MRN: [id number]  Date of Service  01/14/2013   HPI/Events of Note   Hypokalemia  eICU Interventions  Potassium replaced   Intervention Category Intermediate Interventions: Electrolyte abnormality - evaluation and management  DETERDING,ELIZABETH 01/14/2013, 4:59 AM

## 2013-01-14 NOTE — Progress Notes (Signed)
Passy-Muir Speaking Valve - Treatment Patient Details  Name: Renee Nicholson MRN: [id number] Date of Birth: 06-29-1966  Today's Date: 01/14/2013 Time: 1191-4782 SLP Time Calculation (min): 19 min  Past Medical History:  Past Medical History  Diagnosis Date  . Anemia   . Diarrhea   . Anxiety   . Bright red rectal bleeding 09/13/2011  . History of chemotherapy     completed 09/2011   . Hx of radiation therapy 08/29/11 to 10/09/11    rectum  . GERD (gastroesophageal reflux disease)   . Ileostomy in place   . Cancer   . Rectal cancer    Past Surgical History:  Past Surgical History  Procedure Laterality Date  . Tubal ligation    . Carpal tunnel release    . Colonoscopy  07/26/2011    Procedure: COLONOSCOPY;  Surgeon: Arlyce Harman, MD;  Location: [facility];  Service: Endoscopy;  Laterality: N/A;  10:40  . Esophagogastroduodenoscopy  07/26/2011    Procedure: ESOPHAGOGASTRODUODENOSCOPY (EGD);  Surgeon: Arlyce Harman, MD;  Location: [facility];  Service: Endoscopy;  Laterality: N/A;  . Esophagogastroduodenoscopy  12/27/2011    Procedure: ESOPHAGOGASTRODUODENOSCOPY (EGD);  Surgeon: Shirley Friar, MD;  Location: [facility];  Service: Endoscopy;  Laterality: N/A;  . Laparoscopic colon resection  12/14/11    diverting ileostomy  . Portacath placement  02/21/2012    Procedure: INSERTION PORT-A-CATH;  Surgeon: [name], MD;  Location: [facility];  Service: General;  Laterality: N/A;  . Evaluation under anesthesia with anal fissurotomy N/A 11/06/2012    Procedure: Exam Under Anesthesia , Anal Dilation;  Surgeon: [name], MD;  Location: [facility];  Service: General;  Laterality: N/A;  Exam Under Anesthesia , Anal Dilation  . Laparotomy N/A 01/03/2013    Procedure: EXPLORATORY LAPAROTOMY WITH LYSIS OF ADHESIONS, revision of ileostomy;  Surgeon: [name], MD;  Location: [facility];  Service: General;  Laterality: N/A;    Assessment / Plan / Recommendation Clinical Impression  Treatment session focused on therapeutic intervention for PMSV usage. Pt continues to demonstrate limited functional tolerance of PMSV. Though she was able to wear valve for slightly longer periods without blowing off the valve, she was not able to tolerate placement for even one minute. Restlessness increased. Pt required modeling to make attempts at phonation, but even with force pt could barely pass any air to upper airway. There were 1-2 episodes of slight phonatory ability with cues to cough/clear throat.   SLP did not evaluation swallow as audible secretions present above airway with phonation attempts that pt could not clear. Pt needs downsize to 4 cuffless trach to be able to tolerate valve and progress with communiction and PO intake.     Plan  Continue with current plan of care    Follow Up Recommendations  Inpatient Rehab    Pertinent Vitals/Pain NA    SLP Goals Potential to Achieve Goals: Good Progress/Goals/Alternative treatment plan discussed with pt/caregiver and they: Agree SLP Goal #1: Tolerate PMSV placement up to 10 minutes with no change in vital signs or evidence of Co2 trapping with minimal verbal and visual cues to utilize speech strategies SLP Goal #1 - Progress: Progressing toward goal   PMSV Trial  PMSV was placed for: 30-45 second intervals Able to redirect subglottic air through upper airway: Yes Able to Attain Phonation: Yes (minimal) Voice Quality: Aphonic Able to Expectorate Secretions: No Level of Secretion Expectoration with PMSV: Tracheal Breath Support for Phonation: Inadequate Intelligibility: Unable to assess (comment)  Respirations During Trial: 23 SpO2 During Trial: 98 % Behavior: Cooperative   Tracheostomy Tube  Additional Tracheostomy Tube Assessment Trach Collar Period: 24 hours Secretion Description: white Level of Secretion Expectoration: Tracheal    Vent Dependency  FiO2 (%): 21 %    Cuff Deflation Trial  GO    Devonta Blanford,  MA CCC-SLP [phone]  Tolerated Cuff Deflation: Yes Length of Time for Cuff Deflation Trial: cuff deflated prior to PMSV trials.  Behavior: Cooperative   Tevion Laforge, Riley Nearing 01/14/2013, 2:32 PM

## 2013-01-14 NOTE — Progress Notes (Signed)
Physical Therapy Treatment Patient Details Name: Renee Nicholson MRN: [id number] DOB: 11-30-1965 Today's Date: 01/14/2013 Time: 4098-1191 PT Time Calculation (min): 23 min  PT Assessment / Plan / Recommendation Comments on Treatment Session  Progressing with ambulation today and able to get out of the room.  Improved initiation with ability to lift herself up to sit and move along edge of bed with less assist.      Follow Up Recommendations  CIR     Does the patient have the potential to tolerate intense rehabilitation   yes  Barriers to Discharge  none      Equipment Recommendations  Other (comment) (TBA)       Frequency Min 4X/week   Plan Discharge plan remains appropriate    Precautions / Restrictions Precautions Precautions: Fall Precaution Comments: right colostomy, trach   Pertinent Vitals/Pain No pain complaints    Mobility  Bed Mobility Rolling Right: With rail;4: Min assist Right Sidelying to Sit: 4: Min assist;With rails Sitting - Scoot to Edge of Bed: 3: Mod assist Details for Bed Mobility Assistance: sat sitting up several minutes prior to scooting out to edge of bed due to little light headed initially Transfers Sit to Stand: 3: Mod assist;From bed;With upper extremity assist Stand to Sit: 3: Mod assist;To chair/3-in-1 Details for Transfer Assistance: Lifting and lowering assist, slow to come to stand Ambulation/Gait Ambulation/Gait Assistance: 3: Mod assist Ambulation Distance (Feet): 60 Feet Assistive device: 1 person hand held assist Ambulation/Gait Assistance Details: support to lateral side of body for increased upright posture and improved stability (unable to locate youth walker prior to treatment) Gait Pattern: Step-through pattern;Trunk flexed;Shuffle;Decreased stride length      PT Goals Acute Rehab PT Goals Pt will Roll Supine to Right Side: with supervision;with rail PT Goal: Rolling Supine to Right Side - Progress: Updated due to goal met Pt  will go Supine/Side to Sit: with supervision PT Goal: Supine/Side to Sit - Progress: Updated due to goal met Pt will Sit at [facility] of Bed: with modified independence;with no upper extremity support PT Goal: Sit at Edge Of Bed - Progress: Updated due to goal met Pt will go Sit to Stand: with supervision PT Goal: Sit to Stand - Progress: Updated due to goal met Pt will go Stand to Sit: with min assist PT Goal: Stand to Sit - Progress: Progressing toward goal Pt will Ambulate: 51 - 150 feet;with least restrictive assistive device;with min assist;with supervision (min assist 50%  of the time or less) PT Goal: Ambulate - Progress: Updated due to goal met  Visit Information  Last PT Received On: 01/14/13    Subjective Data  Subjective: Indicates she is ready to walk.   Cognition  Cognition Arousal/Alertness: Awake/alert Overall Cognitive Status: Difficult to assess Difficult to assess due to: Tracheostomy;Non-English speaking    Armed forces operational officer Sitting Balance Static Sitting - Balance Support: Bilateral upper extremity supported;Feet unsupported;Feet supported Static Sitting - Level of Assistance: 5: Stand by assistance Static Sitting - Comment/# of Minutes: sat about 4 minutes initially without foot support with bilateral UE support, then moved to edge of bed with feet supported  End of Session PT - End of Session Equipment Utilized During Treatment: Gait belt Activity Tolerance: Patient limited by fatigue Patient left: in chair;with family/visitor present;with call bell/phone within reach   GP     [facility] 01/14/2013, 2:45 PM [place], [place] [phone] 01/14/2013

## 2013-01-14 NOTE — Progress Notes (Signed)
PULMONARY  / CRITICAL CARE MEDICINE DAILY PROGRESS NOTE  Name: Renee Nicholson MRN: [id number] DOB: 10-29-1965    ADMISSION DATE:  12/31/2012 CONSULTATION DATE:  01/01/2013  REFERRING MD :  Donell Beers PRIMARY SERVICE: Surgery  CHIEF COMPLAINT:  Shock  BRIEF PATIENT DESCRIPTION: 47 year old female with a h/o Rectal CA, GERD, recurrent SBO, total colectomy with ileo-anal anastomosis and diverting loop ileostomy. Presented 4/8 with a 24 hour history of abdomnial pain, N/V, and minimal ostomy output. She was admitted for SBO. 4/9 she became tachycardiac and hypotensive. PCCM has been asked to see 4/9  SIGNIFICANT EVENTS / STUDIES:  4/8 - Abd XRay - Small Bowel Obstruction. No peritoneal air.  4/9 - CT abd/pelvis >>> ?LLL consolidation, SBO and adhesions, no free air 4/10 - ETT for tachypnea >>>4/17 4/11- OR lysis of adhesions after being febrile 4/12- Tm 103, repeat cx, add vancomycin/micafungin 4/13- Change to pressure control, lasix x 1 dose 4/15-self extubates, rapid reintubated 4/16 - Enteral feeding  4/16-Echo - EF 60-65%, no WMA, normal valves 4/16-Head CT - No acute intracranial events. Premature atrophy and chronic microvascular ischemic change. 4/17-Trach, reduced sedation 4/18- trach collar 24 hrs   LINES / TUBES: Lt portacath >>  CVL 4/9 >>>4/17 NGT 4/9 >>> ETT 4/10 >>>4/15 self extub rapid retubed>>>4/17 Trach (df) 4/17>>>  CULTURES: BC x 2 4/9 >>> Negative  Urine 4/9 >>> Negative  Repeat BC 4/13 >> NGTD  ANTIBIOTICS:  Meropenem 4/9 >>> 4/19 Vancomycin 4/13 >>4/17 Micafungin 4/13 >>4/16  SUBJECTIVE:  Denies chest/abd pain.  Still has high stool outpt.  VITAL SIGNS: Temp:  [98.1 F (36.7 C)-99 F (37.2 C)] 98.3 F (36.8 C) (04/22 0400) Pulse Rate:  [59-72] 69 (04/22 0400) Resp:  [22-26] 23 (04/22 0400) BP: (111-136)/(64-82) 116/72 mmHg (04/22 0400) SpO2:  [99 %-100 %] 99 % (04/22 0400) FiO2 (%):  [28 %] 28 % (04/22 0400) Weight:  [94 lb 5.7 oz (42.8 kg)] 94 lb  5.7 oz (42.8 kg) (04/22 0433) TC 28%  INTAKE / OUTPUT: Intake/Output     04/21 0701 - 04/22 0700 04/22 0701 - 04/23 0700   I.V. (mL/kg) 430 (10)    NG/GT 1455    IV Piggyback     Total Intake(mL/kg) 1885 (44)    Urine (mL/kg/hr) 3100 (3)    Stool 1800 (1.8)    Total Output 4900     Net -3015            PHYSICAL EXAMINATION: General: No distress Neuro: Alert, follows commands HEENT: trach clean Cardiovascular: regular, no murmur Lungs: scattered rhonchi Abdomen:  NGT, colostomy , surgical dsg intact and clean Skin:  No rash  LABS:  Recent Labs Lab 01/07/13 1626  01/08/13 2140 01/09/13 0420  01/10/13 0440 01/11/13 0430 01/12/13 0500  01/13/13 0444 01/13/13 1644 01/14/13 0400  HGB  --   < >  --  11.1*  --  9.3* 9.8* 8.9*  --  9.2*  --  10.0*  WBC  --   < >  --  14.8*  --  16.0* 15.1* 11.9*  --  9.6  --  8.7  PLT  --   < >  --  59*  --  120* 145* 161  --  184  --  223  NA  --   < > 143 142  < > 148* 148* 149*  < > 138 134* 135  K  --   < > 2.9* 3.6  < > 3.0* 3.5 2.6*  < >  2.9* 3.2* 2.7*  CL  --   < > 107 105  < > 110 112 110  < > 101 97 98  CO2  --   < > 29 29  < > 31 31 33*  < > 32 31 33*  GLUCOSE  --   < > 173* 227*  < > 130* 194* 165*  < > 148* 128* 134*  BUN  --   < > 30* 30*  < > 27* 36* 27*  < > 18 15 12   CREATININE  --   < > 0.53 0.50  < > 0.51 0.62 0.53  < > 0.49* 0.52 0.46*  CALCIUM  --   < > 7.7* 8.3*  < > 7.9* 7.8* 7.9*  < > 7.6* 7.9* 7.4*  MG  --   < > 2.3 2.3  --   --   --   --   --  1.8  --   --   PHOS  --   < > 2.4 2.4  --  2.5  --   --   --  2.1*  --   --   AST  --   < >  --  110*  --  114*  --   --   --   --   --  41*  ALT  --   < >  --  116*  --  142*  --   --   --   --   --  80*  ALKPHOS  --   < >  --  180*  --  230*  --   --   --   --   --  131*  BILITOT  --   < >  --  2.6*  --  2.9*  --   --   --   --   --  1.0  PROT  --   < >  --  5.4*  --  4.7*  --   --   --   --   --  4.4*  ALBUMIN  --   < >  --  2.0*  --  2.0*  --   --   --   --   --  1.8*   INR  --   < >  --  1.35  --  1.50* 1.42 1.40  --  1.31  --   --   TROPONINI  --   --  1.07* 1.08*  --   --   --   --   --   --   --   --   PHART 7.423  --   --   --   --   --   --   --   --   --   --   --   PCO2ART 43.9  --   --   --   --   --   --   --   --   --   --   --   PO2ART 115.0*  --   --   --   --   --   --   --   --   --   --   --   < > = values in this interval not displayed.  Recent Labs Lab 01/13/13 1606 01/13/13 1702 01/13/13 2005 01/13/13 2333 01/14/13 0322  GLUCAP 129* 117* 126* 129* 126*    Imaging: Dg Chest Port 1 [address]  01/13/2013  *RADIOLOGY REPORT*  Clinical Data: Pulmonary edema.  ARDS.  PORTABLE CHEST - 1 VIEW  Comparison: Single view of the chest 01/09/2013.  Findings: The patient's right IJ catheter has been removed. Support apparatus is otherwise unchanged.  There are new small bilateral pleural effusions and basilar atelectasis.  Interstitial opacities do not appear notably changed.  IMPRESSION: New small bilateral pleural effusions and basilar atelectasis.  No other change.   Original Report Authenticated By: Holley Dexter, M.D.      ASSESSMENT / PLAN:  Acute respiratory failure 2nd to aspiration PNA, pulmonary edema, and ARDS.  S/p trach 4/17 with #6 tracheostomy.  Much improved. Plan: -oxygen to keep SpO2 > 92% -continue trach until she is stronger >> may be able to downsize trach soon -continue speech valve as tolerated -even to negative fluid balance as tolerated  SBO in setting of rectal cancer. Dysphagia. Protein-calorie malnutrition. Elevated LFT's. Plan: -post-op care per CCS -continue tube feeds -speech therapy to follow up for swallow evaluation -f/u CMET 4/22 -change protonix to qdaily  Hypokalemia >> likely from high stool outpt. Plan: -f/u and replace electrolytes as needed  Hyperglycemia. Plan: -continue SSI  Anemia of critical illness and chronic disease. Plan: -f/u CBC intermittently -continue lovenox for DVT  prevention  Hx of anxiety >> improved. Pain >> improved Plan: -gradually wean off scheduled ativan >> change to 0.5 mg qhs -gradually wean off scheduled narcotics >> d/c duragesic patch 4/22, and transition to scheduled morphine per tube >> plan to taper off over next several days  Deconditioning. Plan: -PT recommending CIR  Resolved problems >> PNA, ARDS, Peritonitis, Septic shock, Hypernatremia, thrombocytopenia, acute encephalopathy, relative adrenal insufficiency  Updated family at bedside. D/w Dr. Donell Beers  Medically stable for transfer out of SDU and to transfer to CIR when bed available.  Coralyn Helling, MD [facility] Pulmonary/Critical Care 01/14/2013, 7:46 AM Pager:  [phone] After 3pm call: [phone]

## 2013-01-14 NOTE — Progress Notes (Signed)
Pt placed on RA/21% ATC with no complications noted at this time. RT will monitor.

## 2013-01-14 NOTE — Consult Note (Signed)
Physical Medicine and Rehabilitation Consult Reason for Consult: Deconditioning/ileostomy Referring Physician: Dr. Donell Beers   HPI: Renee Nicholson is a 47 y.o. 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 Admitted 12/31/2012 with minimal output from ostomy as well as abdominal and back pain with nausea vomiting. X-rays and imaging consistent with small bowel obstruction. Underwent exploratory laparotomy, lysis of adhesions and ileostomy revision 01/03/2013 for recurrent small bowel obstruction per Dr. Donell Beers. Hospital course complicated by tachycardia and hypotension followed by critical care medicine. Patient required intubation again a fall per critical care medicine self extubated 01/07/2013. Bouts of confusion altered mental status cranial CT scan 416 showed no acute changes. Patient with progressive respiratory decline and ultimately required tracheostomy 01/09/2013. Speech therapy working with Passy-Muir valve. Subcutaneous Lovenox added for DVT prophylaxis. Patient is currently n.p.o. and remains with nasogastric tube feeds for nutritional support. Physical and occupational therapy evaluations completed 01/11/2013 with recommendations of physical medicine rehabilitation consult.   Review of Systems  Gastrointestinal: Positive for nausea, vomiting and abdominal pain.       Reflux  Psychiatric/Behavioral:       Anxiety  All other systems reviewed and are negative.   Past Medical History  Diagnosis Date  . Anemia   . Diarrhea   . Anxiety   . Bright red rectal bleeding 09/13/2011  . History of chemotherapy     completed 09/2011   . Hx of radiation therapy 08/29/11 to 10/09/11    rectum  . GERD (gastroesophageal reflux disease)   . Ileostomy in place   . Cancer   . Rectal cancer    Past Surgical History  Procedure  Laterality Date  . Tubal ligation    . Carpal tunnel release    . Colonoscopy  07/26/2011    Procedure: COLONOSCOPY;  Surgeon: Arlyce Harman, MD;  Location: [facility];  Service: Endoscopy;  Laterality: N/A;  10:40  . Esophagogastroduodenoscopy  07/26/2011    Procedure: ESOPHAGOGASTRODUODENOSCOPY (EGD);  Surgeon: Arlyce Harman, MD;  Location: [facility];  Service: Endoscopy;  Laterality: N/A;  . Esophagogastroduodenoscopy  12/27/2011    Procedure: ESOPHAGOGASTRODUODENOSCOPY (EGD);  Surgeon: Shirley Friar, MD;  Location: [facility];  Service: Endoscopy;  Laterality: N/A;  . Laparoscopic colon resection  12/14/11    diverting ileostomy  . Portacath placement  02/21/2012    Procedure: INSERTION PORT-A-CATH;  Surgeon: [name], MD;  Location: [facility];  Service: General;  Laterality: N/A;  . Evaluation under anesthesia with anal fissurotomy N/A 11/06/2012    Procedure: Exam Under Anesthesia , Anal Dilation;  Surgeon: [name], MD;  Location: [facility];  Service: General;  Laterality: N/A;  Exam Under Anesthesia , Anal Dilation  . Laparotomy N/A 01/03/2013    Procedure: EXPLORATORY LAPAROTOMY WITH LYSIS OF ADHESIONS, revision of ileostomy;  Surgeon: [name], MD;  Location: [facility];  Service: General;  Laterality: N/A;   Family History  Problem Relation Age of Onset  . Diabetes Mother   . Colon cancer Neg Hx   . Cancer Brother 35    rectal cancer lives in [place]   Social History:  reports that she has never smoked. She has never used smokeless tobacco. She reports that she does not drink alcohol or use illicit drugs. Allergies: No Known Allergies Medications Prior to Admission  Medication Sig Dispense Refill  . B Complex-C (B-COMPLEX WITH VITAMIN  C) tablet Take 1 tablet by mouth daily.      . ferrous sulfate 325 (65 FE) MG tablet Take 325 mg by mouth daily with breakfast.      . lactose free nutrition (BOOST) LIQD Take 237 mLs by mouth 2 (two) times daily.      . Multiple  Vitamin (MULTIVITAMIN) tablet Take 1 tablet by mouth every morning.       . pantoprazole (PROTONIX) 40 MG tablet Take 40 mg by mouth every morning.       . potassium chloride SA (K-DUR,KLOR-CON) 20 MEQ tablet Take 20 mEq by mouth every morning.      . Simethicone 125 MG CAPS Take 1 capsule by mouth 4 (four) times daily as needed (for gas or upset stomach).         Home: Home Living Lives With: Spouse Available Help at Discharge: Family;Available 24 hours/day Type of Home: House Home Access: Stairs to enter [employer] of Steps: 4 Entrance Stairs-Rails: Can reach both;Right;Left Home Layout: One level Home Adaptive Equipment: Walker - rolling;Straight cane Additional Comments: equipment used by parents in the home  Functional History:   Functional Status:  Mobility: Bed Mobility Bed Mobility: Rolling Right;Right Sidelying to Sit;Sitting - Scoot to [employer] of Bed Rolling Right: With rail;4: Min assist Rolling Right: Patient Percentage: 30% Right Sidelying to Sit: 3: Mod assist;With rails Right Sidelying to Sit: Patient Percentage: 50% Sitting - Scoot to Edge of Bed: 2: Max assist Transfers Transfers: Sit to Stand;Stand to [employer] to Stand: 1: +2 Total assist;From bed Sit to Stand: Patient Percentage: 40% Stand to Sit: To chair/3-in-1;3: Mod assist Squat Pivot Transfers: 1: +2 Total assist Squat Pivot Transfers: Patient Percentage: 30% Ambulation/Gait Ambulation/Gait Assistance: 1: +1 Total assist;1: +2 Total assist Ambulation/Gait: Patient Percentage: 50% Ambulation Distance (Feet): 6 Feet Assistive device: 2 person hand held assist;1 person hand held assist Ambulation/Gait Assistance Details: leaning posteriorly, increased base of support, faciliatation for lateral weight shift for cue for swing and to increase foot clearance Gait Pattern: Decreased stride length;Wide base of support;Lateral trunk lean to right;Lateral trunk lean to left;Decreased hip/knee flexion -  left;Decreased hip/knee flexion - right    ADL: ADL Toilet Transfer: +2 Total assistance Transfers/Ambulation Related to ADLs: pt shifting weight and attempting to progress ambulation this session without initiation from therapist ADL Comments: Pt requried total (A) for ostomy bag and foley prior to starting. Sister in law present translating.   Cognition: Cognition Overall Cognitive Status: Difficult to assess Arousal/Alertness: Awake/alert Orientation Level: Oriented to person;Disoriented to place;Disoriented to time;Disoriented to situation Cognition Arousal/Alertness: Awake/alert Overall Cognitive Status: Difficult to assess Difficult to assess due to: Tracheostomy;Non-English speaking  Blood pressure 116/72, pulse 69, temperature 98.3 F (36.8 C), temperature source Oral, resp. rate 23, height 4\' 4"  (1.321 m), weight 42.8 kg (94 lb 5.7 oz), last menstrual period 02/20/2012, SpO2 99.00%. Physical Exam  Vitals reviewed. Constitutional:  89 y /o female with NG tube in place  Eyes: EOM are normal.  Neck:  Trach tube in place , #6, minimal secretions.  Cardiovascular: Normal rate and regular rhythm.   Pulmonary/Chest: Effort normal and breath sounds normal. No respiratory distress. She has no wheezes.  Abdominal: Soft. Bowel sounds are normal.  Neurological: She is alert. She has normal reflexes. No cranial nerve deficit. She exhibits normal muscle tone.  Makes good eye contact with examiner.Patient is very labile. Follows simple commands. Strength 3+ to 4/5 UE's. LE's 2- prox to 3+/4 distally. No sensory deficits.  Skin:  Surgical site dressed  Psychiatric:  Quiet, flat    Results for orders placed during the hospital encounter of 12/31/12 (from the past 24 hour(s))  GLUCOSE, CAPILLARY     Status: Abnormal   Collection Time    01/13/13 12:36 PM      Result Value Range   Glucose-Capillary 143 (*) 70 - 99 mg/dL  GLUCOSE, CAPILLARY     Status: Abnormal   Collection Time     01/13/13  4:06 PM      Result Value Range   Glucose-Capillary 129 (*) 70 - 99 mg/dL  BASIC METABOLIC PANEL     Status: Abnormal   Collection Time    01/13/13  4:44 PM      Result Value Range   Sodium 134 (*) 135 - 145 mEq/L   Potassium 3.2 (*) 3.5 - 5.1 mEq/L   Chloride 97  96 - 112 mEq/L   CO2 31  19 - 32 mEq/L   Glucose, Bld 128 (*) 70 - 99 mg/dL   BUN 15  6 - 23 mg/dL   Creatinine, Ser 0.98  0.50 - 1.10 mg/dL   Calcium 7.9 (*) 8.4 - 10.5 mg/dL   GFR calc non Af Amer >90  >90 mL/min   GFR calc Af Amer >90  >90 mL/min  GLUCOSE, CAPILLARY     Status: Abnormal   Collection Time    01/13/13  5:02 PM      Result Value Range   Glucose-Capillary 117 (*) 70 - 99 mg/dL  GLUCOSE, CAPILLARY     Status: Abnormal   Collection Time    01/13/13  8:05 PM      Result Value Range   Glucose-Capillary 126 (*) 70 - 99 mg/dL  GLUCOSE, CAPILLARY     Status: Abnormal   Collection Time    01/13/13 11:33 PM      Result Value Range   Glucose-Capillary 129 (*) 70 - 99 mg/dL  GLUCOSE, CAPILLARY     Status: Abnormal   Collection Time    01/14/13  3:22 AM      Result Value Range   Glucose-Capillary 126 (*) 70 - 99 mg/dL  CBC     Status: Abnormal   Collection Time    01/14/13  4:00 AM      Result Value Range   WBC 8.7  4.0 - 10.5 K/uL   RBC 3.45 (*) 3.87 - 5.11 MIL/uL   Hemoglobin 10.0 (*) 12.0 - 15.0 g/dL   HCT 11.9 (*) 14.7 - 82.9 %   MCV 83.8  78.0 - 100.0 fL   MCH 29.0  26.0 - 34.0 pg   MCHC 34.6  30.0 - 36.0 g/dL   RDW 56.2  13.0 - 86.5 %   Platelets 223  150 - 400 K/uL  COMPREHENSIVE METABOLIC PANEL     Status: Abnormal   Collection Time    01/14/13  4:00 AM      Result Value Range   Sodium 135  135 - 145 mEq/L   Potassium 2.7 (*) 3.5 - 5.1 mEq/L   Chloride 98  96 - 112 mEq/L   CO2 33 (*) 19 - 32 mEq/L   Glucose, Bld 134 (*) 70 - 99 mg/dL   BUN 12  6 - 23 mg/dL   Creatinine, Ser 7.84 (*) 0.50 - 1.10 mg/dL   Calcium 7.4 (*) 8.4 - 10.5 mg/dL   Total Protein 4.4 (*) 6.0 - 8.3  g/dL   Albumin 1.8 (*)  3.5 - 5.2 g/dL   AST 41 (*) 0 - 37 U/L   ALT 80 (*) 0 - 35 U/L   Alkaline Phosphatase 131 (*) 39 - 117 U/L   Total Bilirubin 1.0  0.3 - 1.2 mg/dL   GFR calc non Af Amer >90  >90 mL/min   GFR calc Af Amer >90  >90 mL/min   Dg Chest Port 1 View  01/13/2013  *RADIOLOGY REPORT*  Clinical Data: Pulmonary edema.  ARDS.  PORTABLE CHEST - 1 VIEW  Comparison: Single view of the chest 01/09/2013.  Findings: The patient's right IJ catheter has been removed. Support apparatus is otherwise unchanged.  There are new small bilateral pleural effusions and basilar atelectasis.  Interstitial opacities do not appear notably changed.  IMPRESSION: New small bilateral pleural effusions and basilar atelectasis.  No other change.   Original Report Authenticated By: Holley Dexter, M.D.     Assessment/Plan: Diagnosis: deconditioning after SBO with lysis of ex-lap, lysis of adhesions, ileostomy revision---post-op respiratory failure 1. Does the need for close, 24 hr/day medical supervision in concert with the patient's rehab needs make it unreasonable for this patient to be served in a less intensive setting? Yes 2. Co-Morbidities requiring supervision/potential complications: SIRS, rectal cancer 3. Due to bladder management, bowel management, safety, skin/wound care, disease management, medication administration, pain management and patient education, does the patient require 24 hr/day rehab nursing? Yes 4. Does the patient require coordinated care of a physician, rehab nurse, PT (1-2 hrs/day, 5 days/week), OT (1-2 hrs/day, 5 days/week) and SLP (1-2 hrs/day, 5 days/week) to address physical and functional deficits in the context of the above medical diagnosis(es)? Yes Addressing deficits in the following areas: balance, endurance, locomotion, strength, transferring, bowel/bladder control, bathing, dressing, feeding, grooming, toileting, speech, swallowing and psychosocial support 5. Can the  patient actively participate in an intensive therapy program of at least 3 hrs of therapy per day at least 5 days per week? Yes and Potentially 6. The potential for patient to make measurable gains while on inpatient rehab is excellent 7. Anticipated functional outcomes upon discharge from inpatient rehab are min to mod assist with PT, min to mod assist with OT, supervision to mod I with SLP. 8. Estimated rehab length of stay to reach the above functional goals is: 2-3 weeks 9. Does the patient have adequate social supports to accommodate these discharge functional goals? Yes 10. Anticipated D/C setting: Home 11. Anticipated post D/C treatments: HH therapy 12. Overall Rehab/Functional Prognosis: excellent  RECOMMENDATIONS: This patient's condition is appropriate for continued rehabilitative care in the following setting: CIR Patient has agreed to participate in recommended program. Yes and Potentially Note that insurance prior authorization may be required for reimbursement for recommended care.  Comment: Will follow for medical stability and increased activity tolerance.   Ranelle Oyster, MD, [place] [facility]     01/14/2013

## 2013-01-15 ENCOUNTER — Inpatient Hospital Stay (HOSPITAL_COMMUNITY): Admission: RE | Admit: 2013-01-15 | Payer: BC Managed Care – PPO | Source: Ambulatory Visit | Admitting: General Surgery

## 2013-01-15 ENCOUNTER — Encounter (HOSPITAL_COMMUNITY): Admission: RE | Payer: Self-pay | Source: Ambulatory Visit

## 2013-01-15 DIAGNOSIS — Z930 Tracheostomy status: Secondary | ICD-10-CM

## 2013-01-15 LAB — GLUCOSE, CAPILLARY: Glucose-Capillary: 121 mg/dL — ABNORMAL HIGH (ref 70–99)

## 2013-01-15 LAB — BASIC METABOLIC PANEL
Chloride: 102 mEq/L (ref 96–112)
GFR calc Af Amer: >90 mL/min (ref >90)
GFR calc non Af Amer: >90 mL/min (ref >90)
Potassium: 3.9 mEq/L (ref 3.5–5.1)
Sodium: 137 mEq/L (ref 135–145)

## 2013-01-15 SURGERY — LAPAROSCOPY, DIAGNOSTIC
Anesthesia: General

## 2013-01-15 MED ORDER — LOPERAMIDE HCL 2 MG PO CAPS
4.0000 mg | ORAL_CAPSULE | Freq: Three times a day (TID) | ORAL | Status: DC
  Administered 2013-01-15 – 2013-01-17 (×10): 4 mg via ORAL
  Filled 2013-01-15 (×9): qty 2
  Filled 2013-01-15 (×3): qty 1
  Filled 2013-01-15 (×4): qty 2

## 2013-01-15 NOTE — Progress Notes (Signed)
Physical Therapy Treatment Patient Details Name: Krisanne Lich MRN: [id number] DOB: 16-Jan-1966 Today's Date: 01/15/2013 Time: 0202-0225 PT Time Calculation (min): 23 min  PT Assessment / Plan / Recommendation Comments on Treatment Session  Pt progressing with mobility & PT goals at this date.  Increased ambulation distance & required decreased (A) for transfers.  Recommend trialing ambulation with use of youth RW for next session.      Follow Up Recommendations  CIR     Does the patient have the potential to tolerate intense rehabilitation     Barriers to Discharge        Equipment Recommendations  Other (comment) (TBA)    Recommendations for Other Services    Frequency Min 4X/week   Plan Discharge plan remains appropriate    Precautions / Restrictions Precautions Precautions: Fall Precaution Comments: right colostomy, trach Restrictions Weight Bearing Restrictions: No   Pertinent Vitals/Pain Pt on 5L 02 via trach collar    Mobility  Bed Mobility Bed Mobility: Sit to Supine;Scooting to HOB Sit to Supine: 4: Min guard;HOB flat Scooting to HOB: 1: +2 Total assist (with draw pad) Details for Bed Mobility Assistance: use of draw pad to scoot towards HOB once supine Transfers Transfers: Sit to Stand;Stand to Sit Sit to Stand: 4: Min assist;With upper extremity assist;With armrests;From chair/3-in-1;From bed Stand to Sit: 4: Min assist;With upper extremity assist;With armrests;To chair/3-in-1;To bed Details for Transfer Assistance: (A) for balance & safety.   Pt with LOB posterioly Ambulation/Gait Ambulation/Gait Assistance: 4: Min assist Ambulation Distance (Feet): 300 Feet Assistive device: 1 person hand held assist Ambulation/Gait Assistance Details: +2 to (A) with IV & 02 tank.   (A) for balance & safety.  Recommend trialing ambulation with youth RW.   Gait Pattern: Step-through pattern;Decreased stride length;Decreased weight shift to left;Decreased weight shift to  right;Decreased trunk rotation (decreased floor clearance) Stairs: No Wheelchair Mobility Wheelchair Mobility: No      PT Goals Acute Rehab PT Goals Time For Goal Achievement: 01/25/13 Potential to Achieve Goals: Good Pt will Roll Supine to Right Side: with supervision;with rail Pt will Roll Supine to Left Side: with min assist Pt will go Supine/Side to Sit: with supervision Pt will Sit at Edge of Bed: with modified independence;with no upper extremity support Pt will go Sit to Supine/Side: with min assist PT Goal: Sit to Supine/Side - Progress: Progressing toward goal Pt will go Sit to Stand: with supervision PT Goal: Sit to Stand - Progress: Progressing toward goal Pt will go Stand to Sit: with min assist PT Goal: Stand to Sit - Progress: Progressing toward goal Pt will Transfer Bed to Chair/Chair to Bed: with mod assist Pt will Stand: with min assist;1 - 2 min;with bilateral upper extremity support PT Goal: Stand - Progress: Progressing toward goal Pt will Ambulate: 51 - 150 feet;with least restrictive assistive device;with min assist;with supervision PT Goal: Ambulate - Progress: Progressing toward goal  Visit Information  Last PT Received On: 01/15/13 Assistance Needed: +2 (for lines & equipment)    Subjective Data  Subjective: Indicates she is ready to walk.   Cognition  Cognition Arousal/Alertness: Awake/alert Behavior During Therapy: WFL for tasks assessed/performed Overall Cognitive Status: Difficult to assess Difficult to assess due to: Tracheostomy;Non-English speaking    Balance  Balance Balance Assessed: Yes Static Standing Balance Static Standing - Balance Support: Right upper extremity supported Static Standing - Level of Assistance: 4: Min assist  End of Session PT - End of Session Equipment Utilized During Treatment: Oxygen Activity Tolerance:  Patient tolerated treatment well Patient left: in bed;with call bell/phone within reach;with family/visitor  present Nurse Communication: Mobility status     Verdell Face, [place] [phone] 01/15/2013

## 2013-01-15 NOTE — Progress Notes (Signed)
PULMONARY  / CRITICAL CARE MEDICINE DAILY PROGRESS NOTE  Name: Hajer Dwyer MRN: [id number] DOB: 08/14/1966    ADMISSION DATE:  12/31/2012 CONSULTATION DATE:  01/01/2013  REFERRING MD :  Donell Beers PRIMARY SERVICE: Surgery  CHIEF COMPLAINT:  Shock  BRIEF PATIENT DESCRIPTION: 47 year old female with a h/o Rectal CA, GERD, recurrent SBO, total colectomy with ileo-anal anastomosis and diverting loop ileostomy. Presented 4/8 with a 24 hour history of abdomnial pain, N/V, and minimal ostomy output. She was admitted for SBO. 4/9 she became tachycardiac and hypotensive. PCCM has been asked to see 4/9  SIGNIFICANT EVENTS / STUDIES:  4/8 - Abd XRay - Small Bowel Obstruction. No peritoneal air.  4/9 - CT abd/pelvis >>> ?LLL consolidation, SBO and adhesions, no free air 4/10 - ETT for tachypnea >>>4/17 4/11- OR lysis of adhesions after being febrile 4/12- Tm 103, repeat cx, add vancomycin/micafungin 4/13- Change to pressure control, lasix x 1 dose 4/15-self extubates, rapid reintubated 4/16 - Enteral feeding  4/16-Echo - EF 60-65%, no WMA, normal valves 4/16-Head CT - No acute intracranial events. Premature atrophy and chronic microvascular ischemic change. 4/17-Trach, reduced sedation 4/18- trach collar 24 hrs   LINES / TUBES: Lt portacath >>  CVL 4/9 >>>4/17 NGT 4/9 >>> ETT 4/10 >>>4/15 self extub rapid retubed>>>4/17 Trach (df) 4/17>>>  CULTURES: BC x 2 4/9 >>> Negative  Urine 4/9 >>> Negative  Repeat BC 4/13 >> Negative  ANTIBIOTICS:  Meropenem 4/9 >>> 4/19 Vancomycin 4/13 >>4/17 Micafungin 4/13 >>4/16  SUBJECTIVE:  More alert.  Still has cough with sputum.  VITAL SIGNS: Temp:  [97.6 F (36.4 C)-98.6 F (37 C)] 98.1 F (36.7 C) (04/23 0758) Pulse Rate:  [68-84] 80 (04/23 0909) Resp:  [17-23] 19 (04/23 0909) BP: (101-117)/(54-81) 113/69 mmHg (04/23 0758) SpO2:  [99 %-100 %] 100 % (04/23 0909) FiO2 (%):  [21 %-28 %] 28 % (04/23 0909) Weight:  [85 lb 15.7 oz (39 kg)] 85 lb  15.7 oz (39 kg) (04/23 0400) TC 28%  INTAKE / OUTPUT: Intake/Output     04/22 0701 - 04/23 0700 04/23 0701 - 04/24 0700   I.V. (mL/kg) 480 (12.3)    NG/GT 2455    Total Intake(mL/kg) 2935 (75.3)    Urine (mL/kg/hr) 2500 (2.7) 350 (3.3)   Stool 1825 (1.9) 250 (2.4)   Total Output 4325 600   Net -1390 -600          PHYSICAL EXAMINATION: General: No distress Neuro: Alert, follows commands HEENT: trach clean Cardiovascular: regular, no murmur Lungs: scattered rhonchi Abdomen:  NGT, colostomy , surgical dsg intact and clean Skin:  No rash  LABS:  Recent Labs Lab 01/08/13 2140  01/09/13 0420  01/10/13 0440 01/11/13 0430 01/12/13 0500  01/13/13 0444 01/13/13 1644 01/14/13 0400 01/15/13 0415  HGB  --   < > 11.1*  --  9.3* 9.8* 8.9*  --  9.2*  --  10.0*  --   WBC  --   < > 14.8*  --  16.0* 15.1* 11.9*  --  9.6  --  8.7  --   PLT  --   < > 59*  --  120* 145* 161  --  184  --  223  --   NA 143  --  142  < > 148* 148* 149*  < > 138 134* 135 137  K 2.9*  --  3.6  < > 3.0* 3.5 2.6*  < > 2.9* 3.2* 2.7* 3.9  CL 107  --  105  < > 110 112 110  < > 101 97 98 102  CO2 29  --  29  < > 31 31 33*  < > 32 31 33* 32  GLUCOSE 173*  --  227*  < > 130* 194* 165*  < > 148* 128* 134* 106*  BUN 30*  --  30*  < > 27* 36* 27*  < > 18 15 12 11   CREATININE 0.53  --  0.50  < > 0.51 0.62 0.53  < > 0.49* 0.52 0.46* 0.48*  CALCIUM 7.7*  --  8.3*  < > 7.9* 7.8* 7.9*  < > 7.6* 7.9* 7.4* 7.9*  MG 2.3  --  2.3  --   --   --   --   --  1.8  --   --   --   PHOS 2.4  --  2.4  --  2.5  --   --   --  2.1*  --   --   --   AST  --   --  110*  --  114*  --   --   --   --   --  41*  --   ALT  --   --  116*  --  142*  --   --   --   --   --  80*  --   ALKPHOS  --   --  180*  --  230*  --   --   --   --   --  131*  --   BILITOT  --   --  2.6*  --  2.9*  --   --   --   --   --  1.0  --   PROT  --   --  5.4*  --  4.7*  --   --   --   --   --  4.4*  --   ALBUMIN  --   --  2.0*  --  2.0*  --   --   --   --   --  1.8*   --   INR  --   < > 1.35  --  1.50* 1.42 1.40  --  1.31  --   --   --   TROPONINI 1.07*  --  1.08*  --   --   --   --   --   --   --   --   --   < > = values in this interval not displayed.  Recent Labs Lab 01/14/13 0800 01/14/13 1238 01/14/13 1625 01/14/13 2009 01/15/13 0018  GLUCAP 123* 149* 99 126* 157*    Imaging: No results found.   ASSESSMENT / PLAN:  Acute respiratory failure 2nd to aspiration PNA, pulmonary edema, and ARDS.  S/p trach 4/17 with #6 tracheostomy.  Much improved. Plan: -oxygen to keep SpO2 > 92% -continue trach until she is stronger >> may be able to downsize trach soon; defer until secretions decreased -speech valve as tolerated -even fluid balance  SBO in setting of rectal cancer. Dysphagia. Protein-calorie malnutrition. Plan: -post-op care per CCS -continue tube feeds -speech therapy to follow up for swallow evaluation >> will need to downsize trach before swallowing assessment; defer downsizing trach for now -changed protonix to qdaily  Hypokalemia >> likely from high stool outpt. Plan: -f/u and replace electrolytes as needed  Hyperglycemia. Plan: -continue SSI  Anemia of critical illness and chronic disease.  Plan: -f/u CBC intermittently -continue lovenox for DVT prevention  Hx of anxiety >> improved. Pain >> improved Plan: -gradually wean off scheduled ativan >> change to 0.5 mg qhs; plan to change to prn 4/24 -gradually wean off scheduled narcotics >> d/c'ed duragesic patch 4/22; change morphine to prn only 4/23  Deconditioning. Plan: -PT recommending CIR pending insurance approval  Resolved problems >> PNA, ARDS, Peritonitis, Septic shock, Hypernatremia, thrombocytopenia, acute encephalopathy, relative adrenal insufficiency, elevated LFT's  Updated family at bedside.  Medically stable for transfer out of SDU and to transfer to CIR when bed available.  Coralyn Helling, MD [facility] Pulmonary/Critical Care 01/15/2013, 9:43  AM Pager:  [phone] After 3pm call: [phone]

## 2013-01-15 NOTE — Progress Notes (Signed)
SLP recommending trach downsize. RT still reports mucous plugs. May be premature to change trach size today. As info.

## 2013-01-15 NOTE — Progress Notes (Signed)
Rehab admissions - Evaluated for possible admission.  I spoke with patient, brother, sister-in-law and another brother at the bedside.  I gave them rehab booklets and explained inpatient rehab to them.  I have called and faxed information to [facility] requesting inpatient rehab admission.  Call me for questions.  #[phone]

## 2013-01-16 ENCOUNTER — Encounter: Payer: BC Managed Care – PPO | Admitting: Physical Therapy

## 2013-01-16 LAB — CBC
Hemoglobin: 10.1 g/dL — ABNORMAL LOW (ref 12.0–15.0)
MCH: 30 pg (ref 26.0–34.0)
MCHC: 35.7 g/dL (ref 30.0–36.0)
MCV: 84 fL (ref 78.0–100.0)
Platelets: 275 10*3/uL (ref 150–400)
RBC: 3.37 MIL/uL — ABNORMAL LOW (ref 3.87–5.11)

## 2013-01-16 LAB — BASIC METABOLIC PANEL
BUN: 8 mg/dL (ref 6–23)
CO2: 32 mEq/L (ref 19–32)
Calcium: 8.2 mg/dL — ABNORMAL LOW (ref 8.4–10.5)
Glucose, Bld: 142 mg/dL — ABNORMAL HIGH (ref 70–99)
Sodium: 133 mEq/L — ABNORMAL LOW (ref 135–145)

## 2013-01-16 LAB — GLUCOSE, CAPILLARY
Glucose-Capillary: 96 mg/dL (ref 70–99)
Glucose-Capillary: 119 mg/dL — ABNORMAL HIGH (ref 70–99)
Glucose-Capillary: 134 mg/dL — ABNORMAL HIGH (ref 70–99)
Glucose-Capillary: 102 mg/dL — ABNORMAL HIGH (ref 70–99)

## 2013-01-16 MED ORDER — POTASSIUM CHLORIDE CRYS ER 20 MEQ PO TBCR: 40.0000 meq | EXTENDED_RELEASE_TABLET | Freq: Once | ORAL | Status: DC

## 2013-01-16 MED ORDER — KCL IN DEXTROSE-NACL 40-5-0.9 MEQ/L-%-% IV SOLN
INTRAVENOUS | Status: DC
  Administered 2013-01-16 – 2013-01-17 (×2): via INTRAVENOUS
  Filled 2013-01-16 (×2): qty 1000

## 2013-01-16 MED ORDER — SODIUM CHLORIDE 0.9 % IJ SOLN
INTRAMUSCULAR | Status: AC
  Administered 2013-01-16: 10 mL
  Filled 2013-01-16: qty 10

## 2013-01-16 MED ORDER — POTASSIUM CHLORIDE 20 MEQ/15ML (10%) PO LIQD
ORAL | Status: AC
  Administered 2013-01-16: 40 meq
  Filled 2013-01-16: qty 30

## 2013-01-16 MED ORDER — PANTOPRAZOLE SODIUM 40 MG PO PACK
40.0000 mg | PACK | Freq: Two times a day (BID) | ORAL | Status: DC
  Administered 2013-01-16 – 2013-01-17 (×2): 40 mg
  Filled 2013-01-16 (×4): qty 20

## 2013-01-16 MED ORDER — POTASSIUM CHLORIDE 20 MEQ/15ML (10%) PO LIQD
40.0000 meq | Freq: Once | ORAL | Status: AC
  Filled 2013-01-16: qty 30

## 2013-01-16 MED ORDER — VITAL AF 1.2 CAL PO LIQD
1000.0000 mL | ORAL | Status: DC
  Administered 2013-01-16: 1000 mL
  Filled 2013-01-16 (×4): qty 1000

## 2013-01-16 MED ORDER — POTASSIUM CHLORIDE 20 MEQ/15ML (10%) PO LIQD
40.0000 meq | Freq: Every day | ORAL | Status: DC
  Administered 2013-01-16 – 2013-01-17 (×2): 40 meq
  Filled 2013-01-16: qty 30

## 2013-01-16 NOTE — Progress Notes (Signed)
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 Presented 4/8 with a 24 hour history of abdomnial pain, N/V, and minimal ostomy output. She was admitted for SBO. 4/9 she became tachycardiac and hypotensive. PCCM has been asked to see 4/9  SIGNIFICANT EVENTS / STUDIES:  4/8 - Abd XRay - Small Bowel Obstruction. No peritoneal air.  4/9 - CT abd/pelvis >>> ?LLL consolidation, SBO and adhesions, no free air 4/10 - ETT for tachypnea >>>4/17 4/11- OR lysis of adhesions after being febrile 4/12- Tm 103, repeat cx, add vancomycin/micafungin 4/13- Change to pressure control, lasix x 1 dose 4/15-self extubates, rapid reintubated 4/16 - Enteral feeding  4/16-Echo - EF 60-65%, no WMA, normal valves 4/16-Head CT - No acute intracranial events. Premature atrophy and chronic microvascular ischemic change. 4/17-Trach, reduced sedation 4/18- trach collar 24 hrs   LINES / TUBES: Lt portacath >>  CVL 4/9 >>>4/17 NGT 4/9 >>> ETT 4/10 >>>4/15 self extub rapid retubed>>>4/17 Trach (df) 4/17>>>  CULTURES: BC x 2 4/9 >>> Negative  Urine 4/9 >>> Negative  Repeat BC 4/13 >> Negative  ANTIBIOTICS:  Meropenem 4/9 >>> 4/19 Vancomycin 4/13 >>4/17 Micafungin 4/13 >>4/16  SUBJECTIVE:  No significant events.  Still has cough with sputum.  VITAL SIGNS: Temp:  [97.3 F (36.3 C)-98.8 F (37.1 C)] 98.6 F (37 C) (04/24 0700) Pulse Rate:  [72-87] 78 (04/24 0829) Resp:  [17-22] 17 (04/24 0829) BP: (114-122)/(64-87) 120/68 mmHg (04/24 0829) SpO2:  [98 %-100 %] 100 % (04/24 0829) FiO2 (%):  [28 %] 28 % (04/24 0829) Weight:  [79 lb 12.9 oz (36.2 kg)]  79 lb 12.9 oz (36.2 kg) (04/24 0400) TC 28%  INTAKE / OUTPUT: Intake/Output     04/23 0701 - 04/24 0700 04/24 0701 - 04/25 0700   I.V. (mL/kg) 260 (7.2)    NG/GT 1230    Total Intake(mL/kg) 1490 (41.2)    Urine (mL/kg/hr) 2200 (2.5)    Stool 1700 (2) 200 (2)   Total Output 3900 200   Net -2410 -200          PHYSICAL EXAMINATION: General: No distress Neuro: Alert, follows commands HEENT: trach clean Cardiovascular: regular, no murmur Lungs: scattered rhonchi Abdomen:  NGT, colostomy , surgical dsg intact and clean Skin:  No rash  LABS:  Recent Labs Lab 01/10/13 0440 01/11/13 0430 01/12/13 0500  01/13/13 0444  01/14/13 0400 01/15/13 0415 01/16/13 0435  HGB 9.3* 9.8* 8.9*  --  9.2*  --  10.0*  --  10.1*  WBC 16.0* 15.1* 11.9*  --  9.6  --  8.7  --  9.1  PLT 120* 145* 161  --  184  --  223  --  275  NA 148* 148* 149*  < > 138  < > 135 137 133*  K 3.0* 3.5 2.6*  < > 2.9*  < > 2.7* 3.9 3.3*  CL 110 112 110  < > 101  < > 98 102 96  CO2 31 31 33*  < > 32  < > 33* 32 32  GLUCOSE 130* 194* 165*  < > 148*  < > 134* 106* 142*  BUN 27* 36*  27*  < > 18  < > 12 11 8   CREATININE 0.51 0.62 0.53  < > 0.49*  < > 0.46* 0.48* 0.50  CALCIUM 7.9* 7.8* 7.9*  < > 7.6*  < > 7.4* 7.9* 8.2*  MG  --   --   --   --  1.8  --   --   --   --   PHOS 2.5  --   --   --  2.1*  --   --   --   --   AST 114*  --   --   --   --   --  41*  --   --   ALT 142*  --   --   --   --   --  80*  --   --   ALKPHOS 230*  --   --   --   --   --  131*  --   --   BILITOT 2.9*  --   --   --   --   --  1.0  --   --   PROT 4.7*  --   --   --   --   --  4.4*  --   --   ALBUMIN 2.0*  --   --   --   --   --  1.8*  --   --   INR 1.50* 1.42 1.40  --  1.31  --   --   --   --   < > = values in this interval not displayed.  Recent Labs Lab 01/15/13 1739 01/15/13 2020 01/15/13 2320 01/16/13 0344 01/16/13 0744  GLUCAP 124* 142* 96 119* 134*    ASSESSMENT / PLAN:  Acute respiratory failure 2nd to aspiration PNA,  pulmonary edema, and ARDS.  S/p trach 4/17 with #6 tracheostomy.  Much improved. Plan: -oxygen to keep SpO2 > 92% -continue trach until she is stronger >> may be able to downsize trach soon; defer until secretions decreased -speech valve as tolerated -even fluid balance  SBO in setting of rectal cancer. Dysphagia. Protein-calorie malnutrition. Plan: -post-op care per CCS -continue tube feeds -speech therapy to follow up for swallow evaluation >> will need to downsize trach before swallowing assessment; defer downsizing trach for now -changed protonix to qdaily  Hypokalemia >> likely from high stool outpt. Plan: -f/u and replace electrolytes as needed  Hyperglycemia. Plan: -continue SSI  Anemia of critical illness and chronic disease. Plan: -f/u CBC intermittently -continue lovenox for DVT prevention  Hx of anxiety >> improved. Pain >> improved Plan: -gradually wean off scheduled ativan >> change to prn only 4/24 -gradually wean off scheduled narcotics >> d/c'ed duragesic patch 4/22; change morphine to prn only 4/23  Deconditioning. Plan: -PT recommending CIR pending insurance approval  Resolved problems >> PNA, ARDS, Peritonitis, Septic shock, Hypernatremia, thrombocytopenia, acute encephalopathy, relative adrenal insufficiency, elevated LFT's  Updated family at bedside.  Medically stable for transfer out of SDU and to transfer to CIR when bed available.  Coralyn Helling, MD [facility] Pulmonary/Critical Care 01/16/2013, 9:47 AM Pager:  [phone] After 3pm call: [phone]

## 2013-01-16 NOTE — Progress Notes (Signed)
Physical Therapy Treatment Patient Details Name: Renee Nicholson MRN: [id number] DOB: 22-Apr-1966 Today's Date: 01/16/2013 Time: 1040-1103 PT Time Calculation (min): 23 min  PT Assessment / Plan / Recommendation Comments on Treatment Session  Pt making steady progress.  Even with rolling walker pt still with some loss of balance.     Follow Up Recommendations  CIR     Does the patient have the potential to tolerate intense rehabilitation     Barriers to Discharge        Equipment Recommendations  Rolling walker with 5" wheels    Recommendations for Other Services    Frequency Min 3X/week   Plan Discharge plan remains appropriate;Frequency needs to be updated    Precautions / Restrictions Precautions Precautions: Fall Precaution Comments: right colostomy, trach   Pertinent Vitals/Pain No signs of pain.    Mobility  Bed Mobility Bed Mobility: Supine to Sit Supine to Sit: 5: Supervision;HOB elevated;With rails Sitting - Scoot to Edge of Bed: 5: Supervision Sit to Supine: 5: Supervision;HOB elevated Transfers Sit to Stand: 4: Min assist;With upper extremity assist;From bed Stand to Sit: 4: Min assist;With upper extremity assist;To bed Details for Transfer Assistance: Assist for balance Ambulation/Gait Ambulation/Gait Assistance: 4: Min assist Ambulation Distance (Feet): 300 Feet Assistive device: Rolling walker Ambulation/Gait Assistance Details: Verbal cues for hand placement on rolling walker.  Pt with several losses of balance posteriorly. Gait Pattern: Step-through pattern;Decreased stride length;Decreased trunk rotation Gait velocity: decr    Exercises     PT Diagnosis:    PT Problem List:   PT Treatment Interventions:     PT Goals Acute Rehab PT Goals Pt will go Supine/Side to Sit: with modified independence;with HOB 0 degrees PT Goal: Supine/Side to Sit - Progress: Updated due to goal met Pt will go Sit to Supine/Side: with modified independence;with HOB 0  degrees PT Goal: Sit to Supine/Side - Progress: Updated due to goal met PT Goal: Sit to Stand - Progress: Progressing toward goal Pt will go Stand to Sit: with supervision PT Goal: Stand to Sit - Progress: Updated due to goals met PT Goal: Stand - Progress: Met Pt will Ambulate: >150 feet;with supervision;with least restrictive assistive device PT Goal: Ambulate - Progress: Updated due to goal met  Visit Information  Last PT Received On: 01/16/13 Assistance Needed: +2 (for lines/tubes)    Subjective Data  Subjective: With family members assistance pt indicated she wanted to get in the chair after she got washed up.  Pt agreeable to amb.   Cognition  Cognition Arousal/Alertness: Awake/alert Behavior During Therapy: WFL for tasks assessed/performed Overall Cognitive Status: Difficult to assess Difficult to assess due to: Tracheostomy;Non-English Chiropodist Standing - Balance Support: Bilateral upper extremity supported Static Standing - Level of Assistance: 4: Min assist  End of Session PT - End of Session Equipment Utilized During Treatment: Oxygen Activity Tolerance: Patient tolerated treatment well Patient left: in bed;with call bell/phone within reach;with family/visitor present Nurse Communication: Mobility status   GP     [facility] 01/16/2013, 11:38 AM  [facility] PT [phone]

## 2013-01-16 NOTE — Progress Notes (Signed)
NUTRITION FOLLOW UP / CONSULT  Intervention:    Change TF to elemental formula: Vital AF 1.2 at 25 ml/h, increase by 10 ml every 4 hours to goal rate of 45 ml/h to provide 1296 kcals, 81 gm protein, 876 ml free water daily.  Nutrition Dx:   Inadequate oral intake related to inability to eat as evidenced by NPO status. Ongoing.  Goal:   Intake to meet >90% of estimated nutrition needs, met.  Monitor:   TF tolerance/adequacy, weight trend, labs, ability to begin PO diet.  Assessment:   Patient currently on trach collar. Needs a few more days prior to downsizing trach per MD notes. SLP following for PMSV, not ready for swallow evaluation yet due to secretions. Remains NPO with TF infusing through NG tube.   Ileostomy is in place with increased output: 1325 ml x 24 hours yesterday. Received consult for potential change in TF to decrease ileostomy output.  Current TF Order: Osmolite 1.2 at 45 ml/h is providing 1296 kcals, 60 gm protein, 886 ml free water daily.  Potassium is low today. Receiving repletion.   Height: Ht Readings from Last 1 Encounters:  01/04/13 4\' 4"  (1.321 m)    Weight Status:   Wt Readings from Last 1 Encounters:  01/16/13 79 lb 12.9 oz (36.2 kg)  01/13/13  93 lb 11.1 oz (42.5 kg) 01/09/13  98 lb 8.7 oz (44.7 kg)  01/08/13  102 lb 8.2 oz (46.5 kg) 01/03/13  98 lb 8.7 oz (44.7 kg)  01/01/13  81 lb (36.741 kg) 12/31/12  79 lb (35.834 kg)  12/25/12 72 lb 3.2 oz (32.75 kg)   Weight trending back down.  Re-estimated needs:  Kcal: 1200-1400 Protein: 55-70 gm Fluid: 1.2-1.4 L  Skin: surgical abdominal incisions  Diet Order:  NPO   Intake/Output Summary (Last 24 hours) at 01/16/13 1347 Last data filed at 01/16/13 1300  Gross per 24 hour  Intake   2215 ml  Output   3200 ml  Net   -985 ml    Last BM: 4/24 (ileostomy)  Labs:   Recent Labs Lab 01/10/13 0440  01/13/13 0444  01/14/13 0400 01/15/13 0415 01/16/13 0435  NA 148*  < > 138  < > 135  137 133*  K 3.0*  < > 2.9*  < > 2.7* 3.9 3.3*  CL 110  < > 101  < > 98 102 96  CO2 31  < > 32  < > 33* 32 32  BUN 27*  < > 18  < > 12 11 8   CREATININE 0.51  < > 0.49*  < > 0.46* 0.48* 0.50  CALCIUM 7.9*  < > 7.6*  < > 7.4* 7.9* 8.2*  MG  --   --  1.8  --   --   --   --   PHOS 2.5  --  2.1*  --   --   --   --   GLUCOSE 130*  < > 148*  < > 134* 106* 142*  < > = values in this interval not displayed.  CBG (last 3)   Recent Labs  01/16/13 0344 01/16/13 0744 01/16/13 1125  GLUCAP 119* 134* 102*    Scheduled Meds: . antiseptic oral rinse  15 mL Mouth Rinse q12n4p  . chlorhexidine  15 mL Mouth/Throat BID  . enoxaparin (LOVENOX) injection  30 mg Subcutaneous Q24H  . feeding supplement (OSMOLITE 1.2 CAL)  1,000 mL Per Tube Q24H  . free water  200 mL  Per Tube Q4H  . insulin aspart  0-15 Units Subcutaneous Q4H  . loperamide  4 mg Oral TID PC & HS  . LORazepam  0.5 mg Per Tube QHS  . multivitamin  5 mL Oral Daily  . pantoprazole sodium  40 mg Per Tube Daily  . potassium chloride  40 mEq Per Tube Daily  . simethicone  40 mg Oral QID  . thiamine  100 mg Per Tube Daily    Continuous Infusions:  None   Joaquin Courts, RD, LDN, CNSC Pager [phone] After Hours Pager [phone]

## 2013-01-16 NOTE — Consult Note (Signed)
Ostomy follow-up: Stoma red and viable, flush with skin level, located very close to abd midline wound and difficult to maintain pouch adhesion R/T close proximity to draining wound.  Applied kayara pouch to attempt to maintain seal.  Pt having high volume of liquid brown stool and pouch needs to be emptied frequently. Pouches are only maintaining seal for approx 2 days until wound heals. Family member at bedside to translate.  Pt familiar with pouching routines prior to admission and does not require further teaching regarding pouch application, emptying, or ordering supplies.  Was previously using convex pouches which are too rigid to adhere over abd wound at this time. Supplies at bedside for staff use. Cammie Mcgee MSN, RN, CWOCN, [place], CNS [phone]  .

## 2013-01-16 NOTE — Progress Notes (Signed)
Patient being transferred to 6N bed 3 in her bed. Phone report called to Pat,rn. Patient and family  Members aware of the transfer.

## 2013-01-16 NOTE — Progress Notes (Signed)
Admitted to room 6n03. Oriented to room and surroundings, family at bedside, resp called to come see pt.Marland Kitchen

## 2013-01-16 NOTE — Progress Notes (Signed)
Patient ID: Renee Nicholson, female   DOB: [birth year], 47 y.o.   MRN: [id number] 13 Days Post-Op   Subjective: Continues to improve.  Renee Nicholson needs a few more days prior to downsize.     Objective: Vital signs in last 24 hours: Temp:  [97.3 F (36.3 C)-98.8 F (37.1 C)] 98.6 F (37 C) (04/24 0700) Pulse Rate:  [72-87] 78 (04/24 0829) Resp:  [17-22] 17 (04/24 0829) BP: (114-122)/(64-87) 120/68 mmHg (04/24 0829) SpO2:  [98 %-100 %] 100 % (04/24 0829) FiO2 (%):  [28 %] 28 % (04/24 0829) Weight:  [79 lb 12.9 oz (36.2 kg)] 79 lb 12.9 oz (36.2 kg) (04/24 0400) Last BM Date: 01/15/13  Intake/Output from previous day: 04/23 0701 - 04/24 0700 In: 1490 [I.V.:260; NG/GT:1230] Out: 3900 [Urine:2200; Stool:1700] Intake/Output this shift: Total I/O In: -  Out: 200 [Stool:200]  General appearance: more alert. Resp: breathing comfortably Cardio: regular rate and rhythm GI: s/nd/wound c/d/i/ostomy patent, pink.  Thin output in bag, though better than yesterday.   Lab Results:   Recent Labs  01/14/13 0400 01/16/13 0435  WBC 8.7 9.1  HGB 10.0* 10.1*  HCT 28.9* 28.3*  PLT 223 275   BMET  Recent Labs  01/15/13 0415 01/16/13 0435  NA 137 133*  K 3.9 3.3*  CL 102 96  CO2 32 32  GLUCOSE 106* 142*  BUN 11 8  CREATININE 0.48* 0.50  CALCIUM 7.9* 8.2*   PT/INR No results found for this basename: LABPROT, INR,  in the last 72 hours ABG No results found for this basename: PHART, PCO2, PO2, HCO3,  in the last 72 hours  Studies/Results: No results found.  Anti-infectives: Anti-infectives   Start     Dose/Rate Route Frequency Ordered Stop   01/09/13 1000  vancomycin (VANCOCIN) IVPB 1000 mg/200 mL premix  Status:  Discontinued     1,000 mg 200 mL/hr over 60 Minutes Intravenous Every 12 hours 01/09/13 0042 01/10/13 0933   01/09/13 0600  meropenem (MERREM) 1 g in sodium chloride 0.9 % 100 mL IVPB     1 g 200 mL/hr over 30 Minutes Intravenous 3 times per day 01/09/13 0058 01/11/13 2243    01/09/13 0100  vancomycin (VANCOCIN) IVPB 1000 mg/200 mL premix     1,000 mg 200 mL/hr over 60 Minutes Intravenous  Once 01/09/13 0059 01/09/13 0254   01/06/13 0000  vancomycin (VANCOCIN) IVPB 1000 mg/200 mL premix  Status:  Discontinued     1,000 mg 200 mL/hr over 60 Minutes Intravenous Every 24 hours 01/05/13 0106 01/09/13 0716   01/05/13 1000  micafungin (MYCAMINE) 100 mg in sodium chloride 0.9 % 100 mL IVPB  Status:  Discontinued     100 mg 100 mL/hr over 1 Hours Intravenous Daily 01/05/13 0030 01/09/13 0910   01/05/13 0115  vancomycin (VANCOCIN) IVPB 1000 mg/200 mL premix     1,000 mg 200 mL/hr over 60 Minutes Intravenous NOW 01/05/13 0106 01/05/13 0221   01/02/13 0100  meropenem (MERREM) 1 g in sodium chloride 0.9 % 100 mL IVPB  Status:  Discontinued     1 g 200 mL/hr over 30 Minutes Intravenous 2 times daily 01/01/13 2354 01/09/13 0058   01/01/13 1600  meropenem (MERREM) 1 g in sodium chloride 0.9 % 100 mL IVPB     1 g 200 mL/hr over 30 Minutes Intravenous STAT 01/01/13 1535 01/01/13 1640      Assessment/Plan: s/p Procedure(s): EXPLORATORY LAPAROTOMY WITH LYSIS OF ADHESIONS, revision of ileostomy (N/A) Antibiotics off.  Speech therapy for swallowing.  They are going to wait until downsize to 4 Fr.  OT for ADL asst PT consult for mobility and strengthening for deconditioning. Continue tube feeds for inability to take oral nutrition and for severe protein calorie malnutrition.      TNA off Simethicone for gas. Changed to imodium for high output ileostomy.  Adding back some IVF with K to avoid hypovolemia.   Await input from rehab to see if insurance will cover.   OK to go to rehab when bed available. Will transfer to floor until then.     LOS: 16 days    Renee Nicholson 01/16/2013

## 2013-01-16 NOTE — PMR Pre-admission (Signed)
PMR Admission Coordinator Pre-Admission Assessment  Patient: Renee Nicholson is an 47 y.o., female MRN: [id number] DOB: 12-21-1965 Height: 4\' 4"  (132.1 cm) Weight: 35.925 kg (79 lb 3.2 oz)              Insurance Information HMO:    PPO: Yes     PCP:       IPA:       80/20:       OTHER: Group # [id number] PRIMARY: [employer] of [place]      Policy#: [id number]      Subscriber: Renee Nicholson CM Name: Renee Nicholson      Phone#: [phone]     Fax#: [phone] Pre-Cert#: [id number]  Update due 01/27/13      Employer: Not employed Benefits:  Phone #: [phone]     Name: Shana Chute. Date: 10/26/12     Deduct: $0      Out of Pocket Max: $500(met$500)      Life Max: unlimited CIR: 50% w/auth      SNF: 50% w/auth Outpatient: 50%  30 visits combined     Co-Pay: 50% Home Health: 50% w/auth      Co-Pay: 50/5 DME: 50%     Co-Pay: 50% Providers: in network   Emergency Contact Information Contact Information   Name Relation Home Work Mobile   [place] Spouse [phone]  [phone]   Renee Nicholson [phone]     Renee, Nicholson Daughter   [phone]   Renee Nicholson [phone]       Current Medical History  Patient Admitting Diagnosis: deconditioning after SBO with lysis of ex-lap, lysis of adhesions, ileostomy revision---post op respiratory failure.   History of Present Illness: A 47 y.o. 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 Admitted 12/31/2012 with minimal output from ostomy as well as abdominal and back pain with nausea vomiting. X-rays and imaging consistent with small bowel obstruction. Underwent exploratory laparotomy, lysis of adhesions and ileostomy revision 01/03/2013 for recurrent small bowel obstruction per Dr. Donell Beers. Hospital course complicated by tachycardia and hypotension followed by critical care  medicine. Patient required intubation per critical care medicine and self extubated 01/07/2013. Bouts of confusion altered mental status cranial CT scan 01/08/2013 showed no acute changes. Patient with progressive respiratory decline and ultimately required tracheostomy 01/09/2013 and currently has a #6 tracheostomy and plan followup to evaluate to downsize on 01/20/2013. Speech therapy working with Passy-Muir valve. Subcutaneous Lovenox added for DVT prophylaxis. Patient is currently n.p.o. and remains with nasogastric tube feeds for nutritional support. Physical and occupational therapy evaluations completed 01/11/2013 with recommendations of physical medicine rehabilitation consult. Patient was felt to be a good candidate for inpatient rehabilitation services and was admitted for comprehensive rehabilitation program.   Update:  Was up to bathroom 01/17/13 at 0445 and lost balance, tipped to the right and sat on the floor.  Will continue to be at risk for fall due to decreased balance and decreased endurance.   Past Medical History  Past Medical History  Diagnosis Date  . Anemia   . Diarrhea   . Anxiety   . Bright red rectal bleeding 09/13/2011  . History of chemotherapy     completed 09/2011   . Hx of radiation therapy 08/29/11 to 10/09/11    rectum  . GERD (gastroesophageal reflux disease)   . Ileostomy in place   . Cancer   . Rectal cancer  Family History  family history includes Cancer (age of onset: 54) in her brother and Diabetes in her mother.  There is no history of Colon cancer.  Prior Rehab/Hospitalizations:  No previous rehab admissions.   Current Medications  Current facility-administered medications:antiseptic oral rinse (BIOTENE) solution 15 mL, 15 mL, Mouth Rinse, q12n4p, Leslye Peer, MD, 15 mL at 01/16/13 1655;  chlorhexidine (PERIDEX) 0.12 % solution 15 mL, 15 mL, Mouth/Throat, BID, [name], MD, 15 mL at 01/17/13 0844;  dextrose 5 % and 0.9 % NaCl with KCl 40  mEq/L infusion, , Intravenous, Continuous, [name], MD, Last Rate: 50 mL/hr at 01/16/13 2220 enoxaparin (LOVENOX) injection 30 mg, 30 mg, Subcutaneous, Q24H, Nelda Bucks, MD, 30 mg at 01/17/13 1053;  feeding supplement (VITAL AF 1.2 CAL) liquid 1,000 mL, 1,000 mL, Per Tube, Continuous, Renee Nicholson, RD, Last Rate: 45 mL/hr at 01/16/13 1653, 1,000 mL at 01/16/13 1653;  fentaNYL (SUBLIMAZE) injection 12.5-25 mcg, 12.5-25 mcg, Intravenous, Q1H PRN, [name], MD, 12.5 mcg at 01/16/13 1604 free water 200 mL, 200 mL, Per Tube, Q4H, Nelda Bucks, MD, 200 mL at 01/17/13 1052;  insulin aspart (novoLOG) injection 0-15 Units, 0-15 Units, Subcutaneous, Q4H, Dow Adolph, MD, 2 Units at 01/17/13 [phone];  loperamide (IMODIUM) capsule 4 mg, 4 mg, Oral, TID PC & HS, [name], MD, 4 mg at 01/17/13 0857;  LORazepam (ATIVAN) tablet 0.5 mg, 0.5 mg, Per Tube, QHS, Coralyn Helling, MD multivitamin liquid 5 mL, 5 mL, Oral, Daily, Renee Nicholson, RPH, 5 mL at 01/17/13 1051;  ondansetron (ZOFRAN) 8 mg/NS 50 ml IVPB, 8 mg, Intravenous, Q8H PRN, [name], MD, 8 mg at 01/11/13 0955;  pantoprazole sodium (PROTONIX) 40 mg/20 mL oral suspension 40 mg, 40 mg, Per Tube, BID, [name], MD, 40 mg at 01/17/13 1053;  potassium chloride 20 MEQ/15ML (10%) liquid 40 mEq, 40 mEq, Per Tube, Daily, [name], MD, 40 mEq at 01/17/13 1051 simethicone (MYLICON) 40 MG/0.6ML suspension 40 mg, 40 mg, Oral, QID, [name], MD, 40 mg at 01/17/13 1052;  sodium chloride 0.9 % bolus 1,000 mL, 1,000 mL, Intravenous, PRN, Zigmund Gottron, MD, 1,000 mL at 01/02/13 0419;  thiamine (VITAMIN B-1) tablet 100 mg, 100 mg, Per Tube, Daily, Coralyn Helling, MD, 100 mg at 01/17/13 1050 Facility-Administered Medications Ordered in Other Encounters: heparin lock flush 100 unit/mL, 500 Units, Intravenous, Once, Renee December, MD;  sodium chloride 0.9 % injection 10 mL, 10 mL, Intravenous, PRN, Renee December, MD  Patients  Current Diet:  NPO with Tube feedings nasogastric.  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  Home Assistive Devices / Equipment Home Assistive Devices/Equipment:  (ileostomy supplies) Home Adaptive Equipment: Walker - rolling;Straight cane  Prior Functional Level Prior Function Level of Independence: Independent  Current Functional Level Cognition  Arousal/Alertness: Awake/alert Overall Cognitive Status: Difficult to assess Difficult to assess due to: Tracheostomy;Non-English speaking Orientation Level: Oriented to person;Oriented to place;Oriented to time;Oriented to situation    Extremity Assessment (includes Sensation/Coordination)  RUE ROM/Strength/Tone: Deficits RUE ROM/Strength/Tone Deficits: AAROM WFL, strength grossly 3-/5  RLE ROM/Strength/Tone: Deficits RLE ROM/Strength/Tone Deficits: AROM WFL, strength hip flexion 3-/5, knee extension 3+/5    ADLs  Grooming: Performed;Wash/dry hands;Wash/dry face;Min guard (Patient declined swabbing mouth this date) Where Assessed - Grooming: Supported standing Lower Body Dressing: Performed;Minimal assistance (adjust/pull up  socks; pt reaches feet without difficulty) Where Assessed - Lower Body Dressing: Supported sitting Toilet Transfer: +2 Total assistance Toilet Transfer: Patient Percentage: 70% Toilet Transfer Method: Sit to stand Toilet Transfer Equipment: Comfort height toilet Equipment Used: Gait belt;Rolling walker (youth walker) Transfers/Ambulation Related to ADLs: Patient with decreased balance during session and required assistance with mobility using RW. She also needed mod vc's for safe use of RW (tended to pick it up instead of rolling it) ADL Comments: Family present to translate. Nurse in to suction pt prior to  mobilization. Patient fell last night on the way to the bathroom. Needs reminders to not get up on her own.    Mobility  Bed Mobility: Supine to Sit;Sitting - Scoot to Edge of Bed Rolling Right: With rail;4: Min assist Rolling Right: Patient Percentage: 30% Right Sidelying to Sit: 4: Min assist;With rails Right Sidelying to Sit: Patient Percentage: 50% Supine to Sit: 5: Supervision;HOB elevated;With rails Sitting - Scoot to Edge of Bed: 5: Supervision Sit to Supine: 5: Supervision;HOB elevated Scooting to HOB: 1: +2 Total assist (with draw pad)    Transfers  Transfers: Sit to Stand;Stand to Sit Sit to Stand: 4: Min guard;With upper extremity assist Sit to Stand: Patient Percentage: 40% Stand to Sit: 4: Min guard;With upper extremity assist;To chair/3-in-1 Squat Pivot Transfers: 1: +2 Total assist Squat Pivot Transfers: Patient Percentage: 30%    Ambulation / Gait / Stairs / Wheelchair Mobility  Ambulation/Gait Ambulation/Gait Assistance: 4: Min assist Ambulation/Gait: Patient Percentage: 50% Ambulation Distance (Feet): 400 Feet Assistive device: Rolling walker Ambulation/Gait Assistance Details: pt requires assistance to advance RW; demo decreased stability and balance, pt very unsteady with gt and tends to pick up RW , correctable with constant cues  Gait Pattern: Step-through pattern;Decreased stride length;Decreased trunk rotation Gait velocity: decreased  Stairs: No Wheelchair Mobility Wheelchair Mobility: No    Posture / Games developer Sitting - Balance Support: Bilateral upper extremity supported;Feet unsupported;Feet supported Static Sitting - Level of Assistance: 5: Stand by assistance Static Sitting - Comment/# of Minutes: sat about 4 minutes initially without foot support with bilateral UE support, then moved to edge of bed with feet supported Static Standing Balance Static Standing - Balance Support: Bilateral upper extremity supported;During  functional activity Static Standing - Level of Assistance: 4: Min assist    Special needs/care consideration BiPAP/CPAP No CPM No Continuous Drip IV No Dialysis No         Life Vest No Oxygen 28% trach collar Special Bed no Trach Size Yes, #6 trach Wound Vac (area) No       Skin No                           Bowel mgmt: Ostomy bag with stool drainage Bladder mgmt: Urinary catheter Diabetic mgmt No    Previous Home Environment Living Arrangements: Spouse/significant other Lives With: Spouse Available Help at Discharge: Family;Available 24 hours/day Type of Home: House Home Layout: One level Home Access: Stairs to enter Entrance Stairs-Rails: Can reach both;Right;Left Entrance Stairs-Number of Steps: 4 Home Care Services: No Additional Comments: equipment used by parents in the home  Discharge Living Setting Plans for Discharge Living Setting: Lives with (comment);Other (Comment) (Lives in managers quarters hotel room with spouse/family.) Type of Home at Discharge: Other (Comment) (Lives with father in law, mother in law in hotel room.) Discharge Home Layout: One level Discharge Home Access: Stairs to enter Entrance Stairs-Number of Steps: 3-4  steps Do you have any problems obtaining your medications?: No  Social/Family/Support Systems Patient Roles: Spouse;Parent Contact Information: Ulla Mckiernan - spouse; Keamber Macfadden - daughter Anticipated Caregiver: self and family Anticipated Caregiver's Contact Information: Umesh (h) [phone] (c) [phone]; Azucena Cecil (c) [phone] Ability/Limitations of Caregiver: Husband had hip replacements and cannot do lifting.  Family can provide supervision but not lifting assistance Caregiver Availability: 24/7 Discharge Plan Discussed with Primary Caregiver: Yes Is Caregiver In Agreement with Plan?: Yes Does Caregiver/Family have Issues with Lodging/Transportation while Pt is in Rehab?: No  Goals/Additional Needs Patient/Family Goal  for Rehab: OT min/mod A, ST S/Mod I goals Expected length of stay: 2-3 weeks Cultural Considerations: From [place], is Hindu Dietary Needs: Feeding tube in place nasally. Equipment Needs: TBD Pt/Family Agrees to Admission and willing to participate: Yes Program Orientation Provided & Reviewed with Pt/Caregiver Including Roles  & Responsibilities: Yes   Decrease burden of Care through IP rehab admission:  Not applicable   Possible need for SNF placement upon discharge: Not likely   Patient Condition: This patient's medical and functional status has changed since the consult dated: 01/14/13 in which the Rehabilitation Physician determined and documented that the patient's condition is appropriate for intensive rehabilitative care in an inpatient rehabilitation facility. See "History of Present Illness" (above) for medical update. We have been following for medical stability and increased activity tolerance.  Functional changes are:  Currently requiring minimum assist to ambulate 300 ft with RW.  Ongoing issues with balance and decreased endurance.  We have been awaiting insurance authorization for acute inpatient rehab admission.   Patient's medical and functional status update has been discussed with the Rehabilitation physician and patient remains appropriate for inpatient rehabilitation. Will admit to inpatient rehab today.   Preadmission Screen Completed By:  Trish Mage, 01/17/2013 12:57 PM ______________________________________________________________________   Discussed status with Dr. Riley Kill on 01/17/13 at 1300 and received telephone approval for admission today.   Admission Coordinator:  Trish Mage, time1504/Date04/25/14

## 2013-01-16 NOTE — Progress Notes (Signed)
Utilization review completed.  

## 2013-01-16 NOTE — Progress Notes (Signed)
eLink Physician-Brief Progress Note Patient Name: Renee Nicholson DOB: 27-Oct-1965 MRN: [id number]  Date of Service  01/16/2013   HPI/Events of Note  Hypokalemia on oral KCL 40 mEq   eICU Interventions  Additional 40 mEq potassium today   Intervention Category Minor Interventions: Electrolytes abnormality - evaluation and management  Rayanne Padmanabhan 01/16/2013, 5:20 AM

## 2013-01-16 NOTE — Progress Notes (Signed)
Rehab admissions - Patient is making good progress.  I have not heard back from [facility] regarding possible acute inpatient rehab admission.  I will follow up with [employer] again in the morning.  Call me for questions.  #[phone]

## 2013-01-17 ENCOUNTER — Inpatient Hospital Stay (HOSPITAL_COMMUNITY)
Admission: RE | Admit: 2013-01-17 | Discharge: 2013-01-28 | DRG: 462 | Disposition: A | Discharge status: Home Health | Payer: BC Managed Care – PPO | Source: Intra-hospital | Attending: Physical Medicine & Rehabilitation | Admitting: Physical Medicine & Rehabilitation

## 2013-01-17 DIAGNOSIS — Z5189 Encounter for other specified aftercare: Principal | ICD-10-CM

## 2013-01-17 DIAGNOSIS — E162 Hypoglycemia, unspecified: Secondary | ICD-10-CM

## 2013-01-17 DIAGNOSIS — K56609 Unspecified intestinal obstruction, unspecified as to partial versus complete obstruction: Secondary | ICD-10-CM

## 2013-01-17 DIAGNOSIS — R5381 Other malaise: Secondary | ICD-10-CM

## 2013-01-17 DIAGNOSIS — J9600 Acute respiratory failure, unspecified whether with hypoxia or hypercapnia: Secondary | ICD-10-CM

## 2013-01-17 DIAGNOSIS — Z79899 Other long term (current) drug therapy: Secondary | ICD-10-CM

## 2013-01-17 DIAGNOSIS — F411 Generalized anxiety disorder: Secondary | ICD-10-CM

## 2013-01-17 DIAGNOSIS — Z9221 Personal history of antineoplastic chemotherapy: Secondary | ICD-10-CM

## 2013-01-17 DIAGNOSIS — Z930 Tracheostomy status: Secondary | ICD-10-CM

## 2013-01-17 DIAGNOSIS — Z923 Personal history of irradiation: Secondary | ICD-10-CM

## 2013-01-17 DIAGNOSIS — Z85048 Personal history of other malignant neoplasm of rectum, rectosigmoid junction, and anus: Secondary | ICD-10-CM

## 2013-01-17 DIAGNOSIS — B3781 Candidal esophagitis: Secondary | ICD-10-CM

## 2013-01-17 DIAGNOSIS — Z800 Family history of malignant neoplasm of digestive organs: Secondary | ICD-10-CM

## 2013-01-17 DIAGNOSIS — R1310 Dysphagia, unspecified: Secondary | ICD-10-CM

## 2013-01-17 DIAGNOSIS — J95821 Acute postprocedural respiratory failure: Secondary | ICD-10-CM

## 2013-01-17 DIAGNOSIS — Z932 Ileostomy status: Secondary | ICD-10-CM

## 2013-01-17 DIAGNOSIS — K219 Gastro-esophageal reflux disease without esophagitis: Secondary | ICD-10-CM

## 2013-01-17 DIAGNOSIS — K565 Intestinal adhesions [bands], unspecified as to partial versus complete obstruction: Secondary | ICD-10-CM

## 2013-01-17 DIAGNOSIS — Z833 Family history of diabetes mellitus: Secondary | ICD-10-CM

## 2013-01-17 LAB — GLUCOSE, CAPILLARY
Glucose-Capillary: 111 mg/dL — ABNORMAL HIGH (ref 70–99)
Glucose-Capillary: 120 mg/dL — ABNORMAL HIGH (ref 70–99)
Glucose-Capillary: 122 mg/dL — ABNORMAL HIGH (ref 70–99)
Glucose-Capillary: 130 mg/dL — ABNORMAL HIGH (ref 70–99)
Glucose-Capillary: 131 mg/dL — ABNORMAL HIGH (ref 70–99)

## 2013-01-17 MED ORDER — FREE WATER
200.0000 mL | Status: DC
  Administered 2013-01-17 – 2013-01-21 (×22): 200 mL

## 2013-01-17 MED ORDER — FREE WATER: 200.0000 mL | Status: DC

## 2013-01-17 MED ORDER — VITAL AF 1.2 CAL PO LIQD
1000.0000 mL | ORAL | Status: DC
  Administered 2013-01-17 – 2013-01-19 (×3): 1000 mL
  Filled 2013-01-17 (×5): qty 1000

## 2013-01-17 MED ORDER — ENOXAPARIN SODIUM 30 MG/0.3ML ~~LOC~~ SOLN: 30.0000 mg | SUBCUTANEOUS | Status: DC

## 2013-01-17 MED ORDER — ONDANSETRON HCL 4 MG PO TABS: 4.0000 mg | ORAL_TABLET | Freq: Four times a day (QID) | ORAL | Status: DC | PRN

## 2013-01-17 MED ORDER — POTASSIUM CHLORIDE 20 MEQ/15ML (10%) PO LIQD
20.0000 meq | Freq: Every day | ORAL | Status: DC
  Administered 2013-01-18 – 2013-01-23 (×7): 20 meq via ORAL
  Filled 2013-01-17 (×11): qty 15

## 2013-01-17 MED ORDER — INSULIN ASPART 100 UNIT/ML ~~LOC~~ SOLN
0.0000 [IU] | SUBCUTANEOUS | Status: DC
  Administered 2013-01-18 – 2013-01-22 (×9): 2 [IU] via SUBCUTANEOUS

## 2013-01-17 MED ORDER — PANTOPRAZOLE SODIUM 40 MG PO PACK: 40.0000 mg | PACK | Freq: Two times a day (BID) | ORAL | Status: DC

## 2013-01-17 MED ORDER — BIOTENE DRY MOUTH MT LIQD
15.0000 mL | Freq: Two times a day (BID) | OROMUCOSAL | Status: DC
  Administered 2013-01-18 – 2013-01-28 (×16): 15 mL via OROMUCOSAL

## 2013-01-17 MED ORDER — PANTOPRAZOLE SODIUM 40 MG PO PACK
40.0000 mg | PACK | Freq: Two times a day (BID) | ORAL | Status: DC
  Administered 2013-01-18 – 2013-01-27 (×20): 40 mg
  Filled 2013-01-17 (×26): qty 20

## 2013-01-17 MED ORDER — ADULT MULTIVITAMIN LIQUID CH
5.0000 mL | Freq: Every day | ORAL | Status: DC
  Administered 2013-01-18 – 2013-01-26 (×9): 5 mL via ORAL
  Filled 2013-01-17 (×12): qty 5

## 2013-01-17 MED ORDER — VITAL AF 1.2 CAL PO LIQD: 1000.0000 mL | ORAL | Status: DC

## 2013-01-17 MED ORDER — LORAZEPAM 0.5 MG PO TABS
0.5000 mg | ORAL_TABLET | Freq: Every day | ORAL | Status: DC
  Administered 2013-01-18 – 2013-01-26 (×9): 0.5 mg
  Filled 2013-01-17 (×10): qty 1

## 2013-01-17 MED ORDER — ONDANSETRON HCL 4 MG/2ML IJ SOLN: 4.0000 mg | Freq: Four times a day (QID) | INTRAMUSCULAR | Status: DC | PRN

## 2013-01-17 MED ORDER — SIMETHICONE 40 MG/0.6ML PO SUSP: 40.0000 mg | Freq: Four times a day (QID) | ORAL | Status: DC

## 2013-01-17 MED ORDER — LORAZEPAM 0.5 MG PO TABS: 0.5000 mg | ORAL_TABLET | Freq: Every day | ORAL | Status: DC

## 2013-01-17 MED ORDER — LOPERAMIDE HCL 2 MG PO CAPS: 4.0000 mg | ORAL_CAPSULE | Freq: Three times a day (TID) | ORAL | Status: DC

## 2013-01-17 MED ORDER — VITAMIN B-1 100 MG PO TABS
100.0000 mg | ORAL_TABLET | Freq: Every day | ORAL | Status: DC
  Administered 2013-01-18 – 2013-01-27 (×10): 100 mg
  Filled 2013-01-17 (×12): qty 1

## 2013-01-17 MED ORDER — KCL IN DEXTROSE-NACL 40-5-0.9 MEQ/L-%-% IV SOLN: 50.0000 mL/h | INTRAVENOUS | Status: DC

## 2013-01-17 MED ORDER — ENOXAPARIN SODIUM 30 MG/0.3ML ~~LOC~~ SOLN
30.0000 mg | SUBCUTANEOUS | Status: DC
  Administered 2013-01-18 – 2013-01-27 (×11): 30 mg via SUBCUTANEOUS
  Filled 2013-01-17 (×12): qty 0.3

## 2013-01-17 NOTE — Progress Notes (Signed)
Report called to rehab RN. Transferred to [id number] accompanied by family.Renee Nicholson 01/17/2013

## 2013-01-17 NOTE — Discharge Summary (Signed)
Physician Discharge Summary  Patient ID: Renee Nicholson MRN: [id number] DOB/AGE: Jan 26, 1966 47 y.o.  Admit date: 12/31/2012 Discharge date: 01/17/2013  Admission Diagnoses: SBO Protein calorie malnutrition Rectal cancer, s/p Total abdominal proctocolectomy with IPAA and diverting ileostomy.    Discharge Diagnoses:  Active Problems:   SBO (small bowel obstruction)   Shock   SIRS (systemic inflammatory response syndrome)   Acute respiratory failure   Tracheostomy status Protein calorie malnutrition High output ileostomy   Discharged Condition: stable  Hospital Course:   Pt was admitted to hospital with symptoms consistent with bowel obstruction.  NGT was placed.  She started to have gas in her ostomy bag.  However, she became hypotensive and had mental status changes.  She was transferred to the ICU and received high volume resuscitation.  She appeared to aspirate.  She was intubated and placed on pressors.  She was taken to OR for ex lap and was found to have volvulus around the ileostomy site.  There was no sign of necrotic bowel or abscess.  Her ileostomy was revised and pexed to the abdominal wall.  She continued to require pressors and ventilator support. She self extubated and dropped sats pretty precipitously.  She was trached in order to have her sedation weaned.  She was off pressors and the vent within 24 hours.  She was transferred to stepdown.  Speech was unable to complete passy muir valve trials for very long, and she has not been able yet to have her trach downsized.  She was able to tolerate tube feeds at goal rate, but started having very high ileostomy output.  Lomotil and imodium was tried, but this did not improve. She was switched to elemental tube feeds, but is going to rehab before we can tell if these make a difference.  She will need to have these adjusted in rehab.  The hope is that she will pass swallowing eval and eat normally.  She is having minimal pain.  She is  voiding spontaneously, but is very weak.  She was making progress with PT and OT.    Consults: pulmonary/intensive care, rehabilitation medicine and PT/OT/ST  Significant Diagnostic Studies: labs: See epic.  HCT prior to D/C is 28.3  Treatments: IV hydration, antibiotics: Zosyn, TPN, therapies: PT, OT and ST, procedures: Trach and surgery: See above  Discharge Exam: Blood pressure 116/73, pulse 78, temperature 98.1 F (36.7 C), temperature source Oral, resp. rate 18, height 4\' 4"  (1.321 m), weight 79 lb 3.2 oz (35.925 kg), last menstrual period 02/20/2012, SpO2 100.00%. General appearance: alert, cooperative and no distress Resp: breathing comfotably Cardio: regular rate and rhythm GI: soft, approp tender.  Ostomy with stool and gas in bag. Extremities: extremities normal, atraumatic, no cyanosis or edema  Disposition: 01-Home or Self Care      Discharge Orders   Future Appointments Provider Department Dept Phone   01/18/2013 8:00 AM Hurshel Party [facility]  [address] INPATIENT  REHAB [phone]   01/18/2013 10:00 AM Aris Lot [facility]  [address] INPATIENT  [place] [phone]   01/18/2013 11:00 AM Dorna Mai, PT [facility]  4000 INPATIENT  REHAB [phone]   01/18/2013 1:00 PM Dorna Mai, PT [facility]  4000 INPATIENT  [place] [phone]   01/19/2013 8:00 AM Jackey Loge, PT [facility]  4000 INPATIENT  [place] [phone]   01/20/2013 8:30 AM Rich Brave, OTA [facility]  4000 INPATIENT  REHAB [phone]   01/20/2013 9:30 AM Cristen Hessie Diener, OT [facility]  4000 INPATIENT  REHAB [phone]   01/20/2013 11:15 AM Claretta Fraise [place], PT [facility]  4000 INPATIENT  [place] [phone]   01/20/2013 2:00 PM Claretta Fraise [place], [place] [facility]  4000 INPATIENT  [place] [phone]   03/20/2013  11:00 AM Krista Blue [facility] MEDICAL ONCOLOGY [phone]   03/20/2013 11:30 AM Victorino December, MD [facility] MEDICAL ONCOLOGY [phone]   Future Orders Complete By Expires     Increase activity slowly  As directed         Medication List    STOP taking these medications       pantoprazole 40 MG tablet  Commonly known as:  PROTONIX     potassium chloride SA 20 MEQ tablet  Commonly known as:  K-DUR,KLOR-CON     Simethicone 125 MG Caps      TAKE these medications       B-complex with vitamin C tablet  Take 1 tablet by mouth daily.     dextrose 5 % and 0.9 % NaCl with KCl 40 mEq/L 40-5-0.9 MEQ/L-%-%  Inject 50 mL/hr into the vein continuous.     enoxaparin 30 MG/0.3ML injection  Commonly known as:  LOVENOX  Inject 0.3 mLs (30 mg total) into the skin daily.     feeding supplement (VITAL AF 1.2 CAL) Liqd  Place 1,000 mLs into feeding tube continuous.     ferrous sulfate 325 (65 FE) MG tablet  Take 325 mg by mouth daily with breakfast.     free water Soln  Place 200 mLs into feeding tube every 4 (four) hours.     loperamide 2 MG capsule  Commonly known as:  IMODIUM  Take 2 capsules (4 mg total) by mouth 4 (four) times daily - after meals and at bedtime.     LORazepam 0.5 MG tablet  Commonly known as:  ATIVAN  Place 1 tablet (0.5 mg total) into feeding tube at bedtime.     multivitamin tablet  Take 1 tablet by mouth every morning.     pantoprazole sodium 40 mg/20 mL Pack  Commonly known as:  PROTONIX  Place 20 mLs (40 mg total) into feeding tube 2 (two) times daily.     simethicone 40 MG/0.6ML drops  Commonly known as:  MYLICON  Take 0.6 mLs (40 mg total) by mouth 4 (four) times daily.       Follow-up Information   Follow up with [facility], MD In 3 weeks. (after d/c from rehab)    Contact information:   [address] [place] [address] [phone]       Signed: [name] 01/17/2013,  3:45 PM

## 2013-01-17 NOTE — Consult Note (Addendum)
Ostomy follow-up: Kayara pouch intact with good seal located in close proximity to abd wound.  Stoma red and viable, flush with skin level when visualized through pouch. Emptied 150cc liquid brown stool.  Discussed plan of care with pt's sister who translated to patient.  Pouch will require frequent changes and emptying to avoid soiling of abd wound.  Supplies at bedside for staff nurses. Cammie Mcgee MSN, RN, CWOCN, [place], CNS [phone]

## 2013-01-17 NOTE — Interval H&P Note (Signed)
Monquie Fulgham was admitted today to Inpatient Rehabilitation with the diagnosis of severe deconditioning.  The patient's history has been reviewed, patient examined, and there is no change in status.  Patient continues to be appropriate for intensive inpatient rehabilitation.  I have reviewed the patient's chart and labs.  Questions were answered to the patient's satisfaction.  SWARTZ,ZACHARY T 01/17/2013, 8:37 PM

## 2013-01-17 NOTE — H&P (Signed)
Physical Medicine and Rehabilitation Admission H&P    Chief Complaint  Patient presents with  . Abdominal Pain  : HPI: Renee Nicholson is a 47 y.o. 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 Admitted 12/31/2012 with minimal output from ostomy as well as abdominal and back pain with nausea vomiting. X-rays and imaging consistent with small bowel obstruction. Underwent exploratory laparotomy, lysis of adhesions and ileostomy revision 01/03/2013 for recurrent small bowel obstruction per Dr. Byerly. Hospital course complicated by tachycardia and hypotension followed by critical care medicine. Patient required intubation  per critical care medicine and self extubated 01/07/2013. Bouts of confusion altered mental status cranial CT scan 01/08/2013 showed no acute changes. Patient with progressive respiratory decline and ultimately required tracheostomy 01/09/2013 and currently has a #6 tracheostomy and plan followup to evaluate to downsize on 01/20/2013. Speech therapy working with Passy-Muir valve. Subcutaneous Lovenox added for DVT prophylaxis. Patient is currently n.p.o. and remains with nasogastric tube feeds for nutritional support. Physical and occupational therapy evaluations completed 01/11/2013 with recommendations of physical medicine rehabilitation consult. Patient was felt to be a good candidate for inpatient rehabilitation services and was admitted for comprehensive rehabilitation program  Review of Systems  Gastrointestinal: Positive for nausea, vomiting and abdominal pain.  Reflux  Psychiatric/Behavioral:  Anxiety  All other systems reviewed and are negative   Past Medical History  Diagnosis Date  . Anemia   . Diarrhea   . Anxiety   . Bright red rectal bleeding 09/13/2011  . History of chemotherapy     completed 09/2011    . Hx of radiation therapy 08/29/11 to 10/09/11    rectum  . GERD (gastroesophageal reflux disease)   . Ileostomy in place   . Cancer   . Rectal cancer    Past Surgical History  Procedure Laterality Date  . Tubal ligation    . Carpal tunnel release    . Colonoscopy  07/26/2011    Procedure: COLONOSCOPY;  Surgeon: Sandi M Fields, MD;  Location: [facility];  Service: Endoscopy;  Laterality: N/A;  10:40  . Esophagogastroduodenoscopy  07/26/2011    Procedure: ESOPHAGOGASTRODUODENOSCOPY (EGD);  Surgeon: Sandi M Fields, MD;  Location: [facility];  Service: Endoscopy;  Laterality: N/A;  . Esophagogastroduodenoscopy  12/27/2011    Procedure: ESOPHAGOGASTRODUODENOSCOPY (EGD);  Surgeon: Vincent C. Schooler, MD;  Location: [facility];  Service: Endoscopy;  Laterality: N/A;  . Laparoscopic colon resection  12/14/11    diverting ileostomy  . Portacath placement  02/21/2012    Procedure: INSERTION PORT-A-CATH;  Surgeon: Faera Byerly, MD;  Location: [facility];  Service: General;  Laterality: N/A;  . Evaluation under anesthesia with anal fissurotomy N/A 11/06/2012    Procedure: Exam Under Anesthesia , Anal Dilation;  Surgeon: Faera Byerly, MD;  Location: [facility];  Service: General;  Laterality: N/A;  Exam Under Anesthesia , Anal Dilation  . Laparotomy N/A 01/03/2013    Procedure: EXPLORATORY LAPAROTOMY WITH LYSIS OF ADHESIONS, revision of ileostomy;  Surgeon: Faera Byerly, MD;  Location: [facility];  Service: General;  Laterality: N/A;   Family History  Problem Relation Age of Onset  . Diabetes Mother   . Colon cancer Neg Hx   . Cancer Brother 35    rectal cancer lives in [place]   Social History:  reports that she has never smoked. She has never used smokeless tobacco. She reports that she   does not drink alcohol or use illicit drugs. Allergies: No Known Allergies Medications Prior to Admission  Medication Sig Dispense Refill  . B Complex-C (B-COMPLEX WITH VITAMIN C) tablet Take 1 tablet by mouth daily.       . ferrous sulfate 325 (65 FE) MG tablet Take 325 mg by mouth daily with breakfast.      . lactose free nutrition (BOOST) LIQD Take 237 mLs by mouth 2 (two) times daily.      . Multiple Vitamin (MULTIVITAMIN) tablet Take 1 tablet by mouth every morning.       . pantoprazole (PROTONIX) 40 MG tablet Take 40 mg by mouth every morning.       . potassium chloride SA (K-DUR,KLOR-CON) 20 MEQ tablet Take 20 mEq by mouth every morning.      . Simethicone 125 MG CAPS Take 1 capsule by mouth 4 (four) times daily as needed (for gas or upset stomach).         Home: Home Living Lives With: Spouse Available Help at Discharge: Family;Available 24 hours/day Type of Home: House Home Access: Stairs to enter Entrance Stairs-Number of Steps: 4 Entrance Stairs-Rails: Can reach both;Right;Left Home Layout: One level Home Adaptive Equipment: Walker - rolling;Straight cane Additional Comments: equipment used by parents in the home   Functional History:    Functional Status:  Mobility: Bed Mobility Bed Mobility: Supine to Sit Rolling Right: With rail;4: Min assist Rolling Right: Patient Percentage: 30% Right Sidelying to Sit: 4: Min assist;With rails Right Sidelying to Sit: Patient Percentage: 50% Supine to Sit: 5: Supervision;HOB elevated;With rails Sitting - Scoot to Edge of Bed: 5: Supervision Sit to Supine: 5: Supervision;HOB elevated Scooting to HOB: 1: +2 Total assist (with draw pad) Transfers Transfers: Sit to Stand;Stand to Sit Sit to Stand: 4: Min assist;With upper extremity assist;From bed Sit to Stand: Patient Percentage: 40% Stand to Sit: 4: Min assist;With upper extremity assist;To bed Squat Pivot Transfers: 1: +2 Total assist Squat Pivot Transfers: Patient Percentage: 30% Ambulation/Gait Ambulation/Gait Assistance: 4: Min assist Ambulation/Gait: Patient Percentage: 50% Ambulation Distance (Feet): 300 Feet Assistive device: Rolling walker Ambulation/Gait Assistance Details:  Verbal cues for hand placement on rolling walker.  Pt with several losses of balance posteriorly. Gait Pattern: Step-through pattern;Decreased stride length;Decreased trunk rotation Gait velocity: decr Stairs: No Wheelchair Mobility Wheelchair Mobility: No  ADL: ADL Toilet Transfer: +2 Total assistance Transfers/Ambulation Related to ADLs: pt shifting weight and attempting to progress ambulation this session without initiation from therapist ADL Comments: Pt requried total (A) for ostomy bag and foley prior to starting. Sister in law present translating.   Cognition: Cognition Overall Cognitive Status: Difficult to assess Arousal/Alertness: Awake/alert Orientation Level: Oriented to person;Oriented to place;Oriented to time;Oriented to situation Cognition Arousal/Alertness: Awake/alert Behavior During Therapy: WFL for tasks assessed/performed Overall Cognitive Status: Difficult to assess Difficult to assess due to: Tracheostomy;Non-English speaking  Physical Exam: Blood pressure 120/60, pulse 75, temperature 97.8 F (36.6 C), temperature source Oral, resp. rate 16, height 4' 4" (1.321 m), weight 35.925 kg (79 lb 3.2 oz), last menstrual period 02/20/2012, SpO2 100.00%. Physical Exam  Vitals reviewed.  Constitutional:  46 y /o female with NG tube in place, no distress, frail appearing/petite Eyes: EOM are normal.  Neck:  Trach tube in place , #6, minimal secretions.  Cardiovascular: Normal rate and regular rhythm.  Pulmonary/Chest: Effort normal and breath sounds normal. No respiratory distress. She has no wheezes. No cough. Abdominal: Soft. Bowel sounds are normal. Appropriately tender Neurological: She is alert.   She has normal reflexes. No cranial nerve deficit. She exhibits normal muscle tone.  Makes good eye contact with examiner.generally pleasant and alert. Follows all simple commands. Strength 3+ to 4/5 UE's. LE's 2- to 2/5 at HF prox to 3+/4 distally. No sensory deficits.  DTR's 1+ Skin:  Surgical site dressed /colostomy in place with moderate gas. Close proximity to healing abdominal wounds which are granulating in. Psychiatric:  Quiet, flat    Results for orders placed during the hospital encounter of 12/31/12 (from the past 48 hour(s))  GLUCOSE, CAPILLARY     Status: Abnormal   Collection Time    01/15/13 12:09 PM      Result Value Range   Glucose-Capillary 116 (*) 70 - 99 mg/dL   Comment 1 Documented in Chart     Comment 2 Notify RN    GLUCOSE, CAPILLARY     Status: Abnormal   Collection Time    01/15/13  5:39 PM      Result Value Range   Glucose-Capillary 124 (*) 70 - 99 mg/dL  GLUCOSE, CAPILLARY     Status: Abnormal   Collection Time    01/15/13  8:20 PM      Result Value Range   Glucose-Capillary 142 (*) 70 - 99 mg/dL   Comment 1 Notify RN     Comment 2 Documented in Chart    GLUCOSE, CAPILLARY     Status: None   Collection Time    01/15/13 11:20 PM      Result Value Range   Glucose-Capillary 96  70 - 99 mg/dL   Comment 1 Notify RN     Comment 2 Documented in Chart    GLUCOSE, CAPILLARY     Status: Abnormal   Collection Time    01/16/13  3:44 AM      Result Value Range   Glucose-Capillary 119 (*) 70 - 99 mg/dL  BASIC METABOLIC PANEL     Status: Abnormal   Collection Time    01/16/13  4:35 AM      Result Value Range   Sodium 133 (*) 135 - 145 mEq/L   Potassium 3.3 (*) 3.5 - 5.1 mEq/L   Chloride 96  96 - 112 mEq/L   CO2 32  19 - 32 mEq/L   Glucose, Bld 142 (*) 70 - 99 mg/dL   BUN 8  6 - 23 mg/dL   Creatinine, Ser 0.50  0.50 - 1.10 mg/dL   Calcium 8.2 (*) 8.4 - 10.5 mg/dL   GFR calc non Af Amer >90  >90 mL/min   GFR calc Af Amer >90  >90 mL/min   Comment:            The eGFR has been calculated     using the CKD EPI equation.     This calculation has not been     validated in all clinical     situations.     eGFR's persistently     <90 mL/min signify     possible Chronic Kidney Disease.  CBC     Status: Abnormal    Collection Time    01/16/13  4:35 AM      Result Value Range   WBC 9.1  4.0 - 10.5 K/uL   RBC 3.37 (*) 3.87 - 5.11 MIL/uL   Hemoglobin 10.1 (*) 12.0 - 15.0 g/dL   HCT 28.3 (*) 36.0 - 46.0 %   MCV 84.0  78.0 - 100.0 fL   MCH 30.0  26.0 -   34.0 pg   MCHC 35.7  30.0 - 36.0 g/dL   RDW 15.8 (*) 11.5 - 15.5 %   Platelets 275  150 - 400 K/uL  GLUCOSE, CAPILLARY     Status: Abnormal   Collection Time    01/16/13  7:44 AM      Result Value Range   Glucose-Capillary 134 (*) 70 - 99 mg/dL   Comment 1 Documented in Chart     Comment 2 Notify RN    GLUCOSE, CAPILLARY     Status: Abnormal   Collection Time    01/16/13 11:25 AM      Result Value Range   Glucose-Capillary 102 (*) 70 - 99 mg/dL   Comment 1 Documented in Chart     Comment 2 Notify RN    GLUCOSE, CAPILLARY     Status: Abnormal   Collection Time    01/16/13  4:12 PM      Result Value Range   Glucose-Capillary 127 (*) 70 - 99 mg/dL   Comment 1 Documented in Chart     Comment 2 Notify RN    GLUCOSE, CAPILLARY     Status: None   Collection Time    01/16/13  7:22 PM      Result Value Range   Glucose-Capillary 98  70 - 99 mg/dL  GLUCOSE, CAPILLARY     Status: Abnormal   Collection Time    01/16/13 11:58 PM      Result Value Range   Glucose-Capillary 131 (*) 70 - 99 mg/dL  GLUCOSE, CAPILLARY     Status: Abnormal   Collection Time    01/17/13  3:33 AM      Result Value Range   Glucose-Capillary 120 (*) 70 - 99 mg/dL   Comment 1 Notify RN     Comment 2 Documented in Chart    GLUCOSE, CAPILLARY     Status: Abnormal   Collection Time    01/17/13  8:51 AM      Result Value Range   Glucose-Capillary 130 (*) 70 - 99 mg/dL   No results found.  Post Admission Physician Evaluation: 1. Functional deficits secondary  to deconditioning after small bowel obstruction requiring ex-lap with lysis of adhesions and ileostomy revision. Pt had post-op respiratory failure as well.  2. Patient is admitted to receive collaborative,  interdisciplinary care between the physiatrist, rehab nursing staff, and therapy team. 3. Patient's level of medical complexity and substantial therapy needs in context of that medical necessity cannot be provided at a lesser intensity of care such as a SNF. 4. Patient has experienced substantial functional loss from his/her baseline which was documented above under the "Functional History" and "Functional Status" headings.  Judging by the patient's diagnosis, physical exam, and functional history, the patient has potential for functional progress which will result in measurable gains while on inpatient rehab.  These gains will be of substantial and practical use upon discharge  in facilitating mobility and self-care at the household level. 5. Physiatrist will provide 24 hour management of medical needs as well as oversight of the therapy plan/treatment and provide guidance as appropriate regarding the interaction of the two. 6. 24 hour rehab nursing will assist with bladder management, bowel management, safety, skin/wound care, disease management, medication administration, pain management and patient education  and help integrate therapy concepts, techniques,education, etc. 7. PT will assess and treat for/with: Lower extremity strength, range of motion, stamina, balance, functional mobility, safety, adaptive techniques and equipment, pain mgt, family ed.     Goals are: supervision to mod I. 8. OT will assess and treat for/with: ADL's, functional mobility, safety, upper extremity strength, adaptive techniques and equipment, pain mgt, family ed.   Goals are: supervision to mod I (perhaps additional help with ostomy care). 9. SLP will assess and treat for/with: speech, swallow, communication.  Goals are: mod I. 10. Case Management and Social Worker will assess and treat for psychological issues and discharge planning. 11. Team conference will be held weekly to assess progress toward goals and to determine  barriers to discharge. 12. Patient will receive at least 3 hours of therapy per day at least 5 days per week. 13. ELOS: 10-12 days      Prognosis:  excellent, still with very poor stamina   Medical Problem List and Plan: 1. Deconditioning after small bowel obstruction. Status post exploratory lap, lysis of adhesions ileostomy revision-postop respiratory failure 2. DVT Prophylaxis/Anticoagulation: Subcutaneous Lovenox. Monitor platelet counts any signs of bleeding 3. Mood: Ativan 0.5 mg each bedtime. Provide emotional support. Family appears quite positive and supportive as well.  4. Neuropsych: This patient is capable of making decisions on his/her own behalf. 5. Dysphagia. Currently n.p.o. Continue nasogastric tube feeds and follow per speech therapy. 6. Respiratory failure. 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 Critical care medicine to followup in regards to downsizing trach tube. Speech therapy working with Passy-Muir valve  -would like to potentially downsize the trach to a #4 on monday 7. History of rectal cancer. No plan for ongoing radiation therapy at this time 8. Ileostomy: immodium, simethicone, formula adjustment  -probiotic also  -bag being emptied q2 hours to help protect abdominal wounds.  Zachary T. Swartz, MD, FAAPMR   01/17/2013 

## 2013-01-17 NOTE — Progress Notes (Signed)
Occupational Therapy Treatment Patient Details Name: Renee Nicholson MRN: [id number] DOB: [birth year] Today's Date: 01/17/2013 Time: 4696-2952 OT Time Calculation (min): 38 min  OT Assessment / Plan / Recommendation Comments on Treatment Session Patient progressing towards OT goals. Continue to recommend CIR due to weakness, decreased activity tolerance, decreased mobility, decreased safety and independence with ADLs    Follow Up Recommendations  CIR    Barriers to Discharge       Equipment Recommendations  Other (comment) (TBA)    Recommendations for Other Services    Frequency Min 2X/week   Plan Discharge plan remains appropriate    Precautions / Restrictions Precautions Precautions: Fall Precaution Comments: right colostomy, trach Restrictions Weight Bearing Restrictions: No   Pertinent Vitals/Pain     ADL  Grooming: Performed;Wash/dry hands;Wash/dry face;Min guard (Patient declined swabbing mouth this date) Where Assessed - Grooming: Supported standing Lower Body Dressing: Performed;Minimal assistance (adjust/pull up socks; pt reaches feet without difficulty) Where Assessed - Lower Body Dressing: Supported sitting Toilet Transfer: +2 Total assistance Toilet Transfer: Patient Percentage: 70% Toilet Transfer Method: Sit to Barista: Comfort height toilet Equipment Used: Gait belt;Rolling walker (youth walker) Transfers/Ambulation Related to ADLs: Patient with decreased balance during session and required assistance with mobility using RW. She also needed mod vc's for safe use of RW (tended to pick it up instead of rolling it) ADL Comments: Family present to translate. Nurse in to suction pt prior to mobilization. Patient fell last night on the way to the bathroom. Needs reminders to not get up on her own.    OT Diagnosis:    OT Problem List:   OT Treatment Interventions:     OT Goals ADL Goals ADL Goal: Grooming - Progress: Progressing toward  goals Miscellaneous OT Goals OT Goal: Miscellaneous Goal #1 - Progress: Progressing toward goals  Visit Information  Last OT Received On: 01/17/13 Assistance Needed: +2 PT/OT Co-Evaluation/Treatment: Yes    Subjective Data      Prior Functioning       Cognition  Cognition Arousal/Alertness: Awake/alert Behavior During Therapy: WFL for tasks assessed/performed Overall Cognitive Status: Difficult to assess Difficult to assess due to: Tracheostomy;Non-English speaking    Mobility  Bed Mobility Bed Mobility: Supine to Sit Supine to Sit: 5: Supervision;HOB elevated;With rails Sitting - Scoot to Edge of Bed: 5: Supervision Transfers Sit to Stand: 4: Min guard;With upper extremity assist Stand to Sit: 4: Min guard;With upper extremity assist;To chair/3-in-1 Details for Transfer Assistance: Assist for balance    Exercises  General Exercises - Upper Extremity Shoulder Flexion: AROM;Both;20 reps Elbow Flexion: AROM;Both;20 reps   Balance     End of Session OT - End of Session Equipment Utilized During Treatment: Gait belt Activity Tolerance: Patient tolerated treatment well Patient left: in chair;with call bell/phone within reach;with family/visitor present Nurse Communication: Mobility status;Precautions  GO     Renee Nicholson A 01/17/2013, 11:53 AM

## 2013-01-17 NOTE — Progress Notes (Signed)
Physical Therapy Treatment Patient Details Name: Renee Nicholson MRN: [id number] DOB: 1966-06-03 Today's Date: 01/17/2013 Time: 5284-1324 PT Time Calculation (min): 28 min  PT Assessment / Plan / Recommendation Comments on Treatment Session  Pt slowly making progress; presents with balance deficits and decreased safety awareness. Will benefit from extensive rehab with CIR prior to returning home with husband to help return to [facility] and safely D/C home.      Follow Up Recommendations  CIR     Does the patient have the potential to tolerate intense rehabilitation     Barriers to Discharge        Equipment Recommendations  Rolling walker with 5" wheels    Recommendations for Other Services    Frequency Min 3X/week   Plan Discharge plan remains appropriate;Frequency needs to be updated    Precautions / Restrictions Precautions Precautions: Fall Precaution Comments: right colostomy, trach Restrictions Weight Bearing Restrictions: No   Pertinent Vitals/Pain Denies pain; no new complaints.    Mobility  Bed Mobility Bed Mobility: Supine to Sit;Sitting - Scoot to Edge of Bed Supine to Sit: 5: Supervision;HOB elevated;With rails Sitting - Scoot to Edge of Bed: 5: Supervision Details for Bed Mobility Assistance: with hand rails and bed elevated; demo good technique  Transfers Transfers: Sit to Stand;Stand to Sit Sit to Stand: 4: Min guard;With upper extremity assist Stand to Sit: 4: Min guard;With upper extremity assist;To chair/3-in-1 Details for Transfer Assistance: min guard for balance and to steady; pt requried vc's for hand placement and safety with RW; decreased safety awareness Ambulation/Gait Ambulation/Gait Assistance: 4: Min assist Ambulation Distance (Feet): 400 Feet Assistive device: Rolling walker Ambulation/Gait Assistance Details: pt requires assistance to advance RW; demo decreased stability and balance, pt very unsteady with gt and tends to pick up RW , correctable  with constant cues  Gait Pattern: Step-through pattern;Decreased stride length;Decreased trunk rotation Gait velocity: decreased  Stairs: No Wheelchair Mobility Wheelchair Mobility: No    Exercises General Exercises - Upper Extremity Shoulder Flexion: AROM;Both;20 reps Elbow Flexion: AROM;Both;20 reps General Exercises - Lower Extremity Ankle Circles/Pumps: AROM;Both;20 reps;Seated   PT Diagnosis:    PT Problem List:   PT Treatment Interventions:     PT Goals Acute Rehab PT Goals PT Goal Formulation: With patient/family Time For Goal Achievement: 01/25/13 Potential to Achieve Goals: Good PT Goal: Supine/Side to Sit - Progress: Progressing toward goal PT Goal: Sit at Edge Of Bed - Progress: Progressing toward goal PT Goal: Sit to Stand - Progress: Progressing toward goal PT Goal: Stand to Sit - Progress: Progressing toward goal PT Transfer Goal: Bed to Chair/Chair to Bed - Progress: Progressing toward goal PT Goal: Stand - Progress: Progressing toward goal PT Goal: Ambulate - Progress: Progressing toward goal  Visit Information  Last PT Received On: 01/17/13 Assistance Needed: +2 ((lines and lead assistance) ) PT/OT Co-Evaluation/Treatment: Yes    Subjective Data  Subjective: Family member reports patient fell when trying to amb to bathroom alone; pt Answers yes or no to questions; family member helps interpret; agreeable to therapy    Cognition  Cognition Arousal/Alertness: Awake/alert Behavior During Therapy: WFL for tasks assessed/performed Overall Cognitive Status: Difficult to assess Difficult to assess due to: Tracheostomy;Non-English speaking    Balance  Balance Balance Assessed: Yes Static Standing Balance Static Standing - Balance Support: Bilateral upper extremity supported;During functional activity Static Standing - Level of Assistance: 4: Min assist  End of Session PT - End of Session Equipment Utilized During Treatment: Gait belt;Oxygen (6L ) Activity  Tolerance: Patient tolerated treatment well Patient left: in chair;with call bell/phone within reach;with family/visitor present Nurse Communication: Mobility status   GP     Donell Sievert, Pt [phone] 01/17/2013, 12:55 PM

## 2013-01-17 NOTE — Progress Notes (Signed)
Patient ID: Renee Nicholson, female   DOB: 1966/01/08, 47 y.o.   MRN: [id number] Patient ID: Renee Nicholson, female   DOB: December 19, 1965, 47 y.o.   MRN: [id number] 14 Days Post-Op   Subjective: Switched to elemental tube feeds for high ileostomy output.  Objective: Vital signs in last 24 hours: Temp:  [97.3 F (36.3 C)-98.8 F (37.1 C)] 97.5 F (36.4 C) (04/25 1000) Pulse Rate:  [75-91] 88 (04/25 1000) Resp:  [16-20] 18 (04/25 1000) BP: (110-130)/(59-73) 127/73 mmHg (04/25 1000) SpO2:  [99 %-100 %] 100 % (04/25 1000) FiO2 (%):  [28 %] 28 % (04/25 1000) Weight:  [79 lb 3.2 oz (35.925 kg)] 79 lb 3.2 oz (35.925 kg) (04/25 0237) Last BM Date: 01/16/13  Intake/Output from previous day: 04/24 0701 - 04/25 0700 In: 2449.1 [I.V.:584.1; NG/GT:1865] Out: 2250 [Urine:550; Stool:1700] Intake/Output this shift:    General appearance: more alert. Resp: breathing comfortably Cardio: regular rate and rhythm GI: s/nd/wound c/d/i/ostomy patent, pink.  Thin output in bag   Lab Results:   Recent Labs  01/16/13 0435  WBC 9.1  HGB 10.1*  HCT 28.3*  PLT 275   BMET  Recent Labs  01/15/13 0415 01/16/13 0435  NA 137 133*  K 3.9 3.3*  CL 102 96  CO2 32 32  GLUCOSE 106* 142*  BUN 11 8  CREATININE 0.48* 0.50  CALCIUM 7.9* 8.2*   PT/INR No results found for this basename: LABPROT, INR,  in the last 72 hours ABG No results found for this basename: PHART, PCO2, PO2, HCO3,  in the last 72 hours  Studies/Results: No results found.  Anti-infectives: Anti-infectives   Start     Dose/Rate Route Frequency Ordered Stop   01/09/13 1000  vancomycin (VANCOCIN) IVPB 1000 mg/200 mL premix  Status:  Discontinued     1,000 mg 200 mL/hr over 60 Minutes Intravenous Every 12 hours 01/09/13 0042 01/10/13 0933   01/09/13 0600  meropenem (MERREM) 1 g in sodium chloride 0.9 % 100 mL IVPB     1 g 200 mL/hr over 30 Minutes Intravenous 3 times per day 01/09/13 0058 01/11/13 2243   01/09/13 0100  vancomycin  (VANCOCIN) IVPB 1000 mg/200 mL premix     1,000 mg 200 mL/hr over 60 Minutes Intravenous  Once 01/09/13 0059 01/09/13 0254   01/06/13 0000  vancomycin (VANCOCIN) IVPB 1000 mg/200 mL premix  Status:  Discontinued     1,000 mg 200 mL/hr over 60 Minutes Intravenous Every 24 hours 01/05/13 0106 01/09/13 0716   01/05/13 1000  micafungin (MYCAMINE) 100 mg in sodium chloride 0.9 % 100 mL IVPB  Status:  Discontinued     100 mg 100 mL/hr over 1 Hours Intravenous Daily 01/05/13 0030 01/09/13 0910   01/05/13 0115  vancomycin (VANCOCIN) IVPB 1000 mg/200 mL premix     1,000 mg 200 mL/hr over 60 Minutes Intravenous NOW 01/05/13 0106 01/05/13 0221   01/02/13 0100  meropenem (MERREM) 1 g in sodium chloride 0.9 % 100 mL IVPB  Status:  Discontinued     1 g 200 mL/hr over 30 Minutes Intravenous 2 times daily 01/01/13 2354 01/09/13 0058   01/01/13 1600  meropenem (MERREM) 1 g in sodium chloride 0.9 % 100 mL IVPB     1 g 200 mL/hr over 30 Minutes Intravenous STAT 01/01/13 1535 01/01/13 1640      Assessment/Plan: s/p Procedure(s): EXPLORATORY LAPAROTOMY WITH LYSIS OF ADHESIONS, revision of ileostomy (N/A) Antibiotics off.    Speech therapy for swallowing.  They are  going to wait until downsize to 4 Fr.  OT for ADL asst PT consult for mobility and strengthening for deconditioning. Continue elemental tube feeds.  Will see if decreased output.     TNA off Simethicone for gas. Changed to imodium for high output ileostomy.   IVF with K to avoid hypovolemia.   Await input from rehab to see if insurance will cover.   OK to go to rehab when bed available. .     LOS: 17 days    [name] 01/17/2013

## 2013-01-17 NOTE — Progress Notes (Signed)
Rehab admissions - I have approval for acute inpatient rehab admission.  Bed has become available this afternoon and can admit to acute inpatient rehab today.  Call me for questions.  #[phone]

## 2013-01-17 NOTE — Progress Notes (Signed)
Pt ambulated to bathroom with RN assistance. When pt went to sit down on toilet she lost her balance and tipped to the right and sat on the floor. Pt assisted back up to toilet and then assisted by 2 RN's back to bed. Pt had no obvious injury. VSS. Physician notified and no new orders received. Pt family member in room at time of the event.

## 2013-01-17 NOTE — Progress Notes (Signed)
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 Presented 4/8 with a 24 hour history of abdomnial pain, N/V, and minimal ostomy output. She was admitted for SBO. 4/9 she became tachycardiac and hypotensive. PCCM has been asked to see 4/9  SIGNIFICANT EVENTS / STUDIES:  4/8 - Abd XRay - Small Bowel Obstruction. No peritoneal air.  4/9 - CT abd/pelvis >>> ?LLL consolidation, SBO and adhesions, no free air 4/10 - ETT for tachypnea >>>4/17 4/11- OR lysis of adhesions after being febrile 4/12- Tm 103, repeat cx, add vancomycin/micafungin 4/13- Change to pressure control, lasix x 1 dose 4/15-self extubates, rapid reintubated 4/16 - Enteral feeding  4/16-Echo - EF 60-65%, no WMA, normal valves 4/16-Head CT - No acute intracranial events. Premature atrophy and chronic microvascular ischemic change. 4/17-Trach, reduced sedation 4/18- trach collar 24 hrs   LINES / TUBES: Lt portacath >>  CVL 4/9 >>>4/17 NGT 4/9 >>> ETT 4/10 >>>4/15 self extub rapid retubed>>>4/17 Trach (df) 4/17>>>  CULTURES: BC x 2 4/9 >>> Negative  Urine 4/9 >>> Negative  Repeat BC 4/13 >> Negative  ANTIBIOTICS:  Meropenem 4/9 >>> 4/19 Vancomycin 4/13 >>4/17 Micafungin 4/13 >>4/16  SUBJECTIVE:  No significant events.  Still has cough with sputum.  VITAL SIGNS: Temp:  [97.3 F (36.3 C)-98.8 F (37.1 C)] 97.8 F (36.6 C) (04/25 0550) Pulse Rate:  [75-91] 75 (04/25 0827) Resp:  [16-20] 16 (04/25 0827) BP: (110-130)/(59-73) 120/60 mmHg (04/25 0550) SpO2:  [99 %-100 %] 100 % (04/25 0827) FiO2 (%):  [28 %] 28 % (04/25 0827) Weight:  [35.925 kg (79 lb 3.2  oz)] 35.925 kg (79 lb 3.2 oz) (04/25 0237) TC 28%  INTAKE / OUTPUT: Intake/Output     04/24 0701 - 04/25 0700 04/25 0701 - 04/26 0700   I.V. (mL/kg) 584.1 (16.3)    NG/GT 1865    Total Intake(mL/kg) 2449.1 (68.2)    Urine (mL/kg/hr) 550 (0.6)    Stool 1700 (2)    Total Output 2250     Net +199.1          Urine Occurrence 4 x      PHYSICAL EXAMINATION: General: No distress Neuro: Alert, follows commands HEENT: trach clean Cardiovascular: regular, no murmur Lungs: scattered rhonchi Abdomen:  NGT, colostomy , surgical dsg intact and clean Skin:  No rash  LABS:  Recent Labs Lab 01/11/13 0430 01/12/13 0500  01/13/13 0444  01/14/13 0400 01/15/13 0415 01/16/13 0435  HGB 9.8* 8.9*  --  9.2*  --  10.0*  --  10.1*  WBC 15.1* 11.9*  --  9.6  --  8.7  --  9.1  PLT 145* 161  --  184  --  223  --  275  NA 148* 149*  < > 138  < > 135 137 133*  K 3.5 2.6*  < > 2.9*  < > 2.7* 3.9 3.3*  CL 112 110  < > 101  < > 98 102 96  CO2 31 33*  < > 32  < > 33* 32 32  GLUCOSE 194* 165*  < > 148*  < > 134* 106* 142*  BUN 36* 27*  < >  18  < > 12 11 8   CREATININE 0.62 0.53  < > 0.49*  < > 0.46* 0.48* 0.50  CALCIUM 7.8* 7.9*  < > 7.6*  < > 7.4* 7.9* 8.2*  MG  --   --   --  1.8  --   --   --   --   PHOS  --   --   --  2.1*  --   --   --   --   AST  --   --   --   --   --  41*  --   --   ALT  --   --   --   --   --  80*  --   --   ALKPHOS  --   --   --   --   --  131*  --   --   BILITOT  --   --   --   --   --  1.0  --   --   PROT  --   --   --   --   --  4.4*  --   --   ALBUMIN  --   --   --   --   --  1.8*  --   --   INR 1.42 1.40  --  1.31  --   --   --   --   < > = values in this interval not displayed.  Recent Labs Lab 01/16/13 1612 01/16/13 1922 01/16/13 2358 01/17/13 0333 01/17/13 0851  GLUCAP 127* 98 131* 120* 130*    ASSESSMENT / PLAN:  Acute respiratory failure 2nd to aspiration PNA, pulmonary edema, and ARDS.  S/p trach 4/17 with #6 tracheostomy.  Much  improved. Plan: -oxygen to keep SpO2 > 92% -continue trach until she is stronger >> may be able to downsize trach soon; defer until secretions decreased. Evaluate for downsize on 4-28. -speech valve as tolerated -even fluid balance  SBO in setting of rectal cancer. Dysphagia. Protein-calorie malnutrition. Plan: -post-op care per [facility] -continue tube feeds -speech therapy to follow up for swallow evaluation >> will need to downsize trach before swallowing assessment; defer downsizing trach for now -changed protonix to qdaily  Hypokalemia >> likely from high stool outpt. Plan: -f/u and replace electrolytes as needed  Hyperglycemia. Plan: -continue SSI  Anemia of critical illness and chronic disease. Plan: -f/u CBC intermittently -continue lovenox for DVT prevention  Hx of anxiety >> improved. Pain >> improved Plan: -gradually wean off scheduled ativan >> change to prn only 4/24 -gradually wean off scheduled narcotics >> d/c'ed duragesic patch 4/22; change fentanyl  to prn only 4/23  Deconditioning. Plan: -PT recommending CIR pending insurance approval  Resolved problems >> PNA, ARDS, Peritonitis, Septic shock, Hypernatremia, thrombocytopenia, acute encephalopathy, relative adrenal insufficiency, elevated LFT's  Updated family at bedside.  Medically stable on floor and ready  to transfer to CIR when bed available.  PCCM will see again on 4-28  [facility] ACNP Renee Nicholson PCCM Pager [phone] till 3 pm If no answer page [phone] 01/17/2013, 9:48 AM    I have interviewed and examined the patient and reviewed the database. I have formulated the assessment and plan as reflected in the note above with amendments made by me.   Renee Fischer, MD;  PCCM service; Mobile [phone]

## 2013-01-17 NOTE — H&P (View-Only) (Signed)
Physical Medicine and Rehabilitation Admission H&P    Chief Complaint  Patient presents with  . Abdominal Pain  : HPI: Renee Nicholson is a 47 y.o. 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 Admitted 12/31/2012 with minimal output from ostomy as well as abdominal and back pain with nausea vomiting. X-rays and imaging consistent with small bowel obstruction. Underwent exploratory laparotomy, lysis of adhesions and ileostomy revision 01/03/2013 for recurrent small bowel obstruction per Dr. Donell Beers. Hospital course complicated by tachycardia and hypotension followed by critical care medicine. Patient required intubation  per critical care medicine and self extubated 01/07/2013. Bouts of confusion altered mental status cranial CT scan 01/08/2013 showed no acute changes. Patient with progressive respiratory decline and ultimately required tracheostomy 01/09/2013 and currently has a #6 tracheostomy and plan followup to evaluate to downsize on 01/20/2013. Speech therapy working with Passy-Muir valve. Subcutaneous Lovenox added for DVT prophylaxis. Patient is currently n.p.o. and remains with nasogastric tube feeds for nutritional support. Physical and occupational therapy evaluations completed 01/11/2013 with recommendations of physical medicine rehabilitation consult. Patient was felt to be a good candidate for inpatient rehabilitation services and was admitted for comprehensive rehabilitation program  Review of Systems  Gastrointestinal: Positive for nausea, vomiting and abdominal pain.  Reflux  Psychiatric/Behavioral:  Anxiety  All other systems reviewed and are negative   Past Medical History  Diagnosis Date  . Anemia   . Diarrhea   . Anxiety   . Bright red rectal bleeding 09/13/2011  . History of chemotherapy     completed 09/2011    . Hx of radiation therapy 08/29/11 to 10/09/11    rectum  . GERD (gastroesophageal reflux disease)   . Ileostomy in place   . Cancer   . Rectal cancer    Past Surgical History  Procedure Laterality Date  . Tubal ligation    . Carpal tunnel release    . Colonoscopy  07/26/2011    Procedure: COLONOSCOPY;  Surgeon: Arlyce Harman, MD;  Location: [facility];  Service: Endoscopy;  Laterality: N/A;  10:40  . Esophagogastroduodenoscopy  07/26/2011    Procedure: ESOPHAGOGASTRODUODENOSCOPY (EGD);  Surgeon: Arlyce Harman, MD;  Location: [facility];  Service: Endoscopy;  Laterality: N/A;  . Esophagogastroduodenoscopy  12/27/2011    Procedure: ESOPHAGOGASTRODUODENOSCOPY (EGD);  Surgeon: Shirley Friar, MD;  Location: [facility];  Service: Endoscopy;  Laterality: N/A;  . Laparoscopic colon resection  12/14/11    diverting ileostomy  . Portacath placement  02/21/2012    Procedure: INSERTION PORT-A-CATH;  Surgeon: [name], MD;  Location: [facility];  Service: General;  Laterality: N/A;  . Evaluation under anesthesia with anal fissurotomy N/A 11/06/2012    Procedure: Exam Under Anesthesia , Anal Dilation;  Surgeon: [name], MD;  Location: [facility];  Service: General;  Laterality: N/A;  Exam Under Anesthesia , Anal Dilation  . Laparotomy N/A 01/03/2013    Procedure: EXPLORATORY LAPAROTOMY WITH LYSIS OF ADHESIONS, revision of ileostomy;  Surgeon: [name], MD;  Location: [facility];  Service: General;  Laterality: N/A;   Family History  Problem Relation Age of Onset  . Diabetes Mother   . Colon cancer Neg Hx   . Cancer Brother 35    rectal cancer lives in [place]   Social History:  reports that she has never smoked. She has never used smokeless tobacco. She reports that she  does not drink alcohol or use illicit drugs. Allergies: No Known Allergies Medications Prior to Admission  Medication Sig Dispense Refill  . B Complex-C (B-COMPLEX WITH VITAMIN C) tablet Take 1 tablet by mouth daily.       . ferrous sulfate 325 (65 FE) MG tablet Take 325 mg by mouth daily with breakfast.      . lactose free nutrition (BOOST) LIQD Take 237 mLs by mouth 2 (two) times daily.      . Multiple Vitamin (MULTIVITAMIN) tablet Take 1 tablet by mouth every morning.       . pantoprazole (PROTONIX) 40 MG tablet Take 40 mg by mouth every morning.       . potassium chloride SA (K-DUR,KLOR-CON) 20 MEQ tablet Take 20 mEq by mouth every morning.      . Simethicone 125 MG CAPS Take 1 capsule by mouth 4 (four) times daily as needed (for gas or upset stomach).         Home: Home Living Lives With: Spouse Available Help at Discharge: Family;Available 24 hours/day Type of Home: House Home Access: Stairs to enter [employer] of Steps: 4 Entrance Stairs-Rails: Can reach both;Right;Left Home Layout: One level Home Adaptive Equipment: Walker - rolling;Straight cane Additional Comments: equipment used by parents in the home   Functional History:    Functional Status:  Mobility: Bed Mobility Bed Mobility: Supine to Sit Rolling Right: With rail;4: Min assist Rolling Right: Patient Percentage: 30% Right Sidelying to Sit: 4: Min assist;With rails Right Sidelying to Sit: Patient Percentage: 50% Supine to Sit: 5: Supervision;HOB elevated;With rails Sitting - Scoot to Edge of Bed: 5: Supervision Sit to Supine: 5: Supervision;HOB elevated Scooting to HOB: 1: +2 Total assist (with draw pad) Transfers Transfers: Sit to Stand;Stand to Sit Sit to Stand: 4: Min assist;With upper extremity assist;From bed Sit to Stand: Patient Percentage: 40% Stand to Sit: 4: Min assist;With upper extremity assist;To bed Squat Pivot Transfers: 1: +2 Total assist Squat Pivot Transfers: Patient Percentage: 30% Ambulation/Gait Ambulation/Gait Assistance: 4: Min assist Ambulation/Gait: Patient Percentage: 50% Ambulation Distance (Feet): 300 Feet Assistive device: Rolling walker Ambulation/Gait Assistance Details:  Verbal cues for hand placement on rolling walker.  Pt with several losses of balance posteriorly. Gait Pattern: Step-through pattern;Decreased stride length;Decreased trunk rotation Gait velocity: decr Stairs: No Wheelchair Mobility Wheelchair Mobility: No  ADL: ADL Toilet Transfer: +2 Total assistance Transfers/Ambulation Related to ADLs: pt shifting weight and attempting to progress ambulation this session without initiation from therapist ADL Comments: Pt requried total (A) for ostomy bag and foley prior to starting. Sister in law present translating.   Cognition: Cognition Overall Cognitive Status: Difficult to assess Arousal/Alertness: Awake/alert Orientation Level: Oriented to person;Oriented to place;Oriented to time;Oriented to situation Cognition Arousal/Alertness: Awake/alert Behavior During Therapy: WFL for tasks assessed/performed Overall Cognitive Status: Difficult to assess Difficult to assess due to: Tracheostomy;Non-English speaking  Physical Exam: Blood pressure 120/60, pulse 75, temperature 97.8 F (36.6 C), temperature source Oral, resp. rate 16, height 4\' 4"  (1.321 m), weight 35.925 kg (79 lb 3.2 oz), last menstrual period 02/20/2012, SpO2 100.00%. Physical Exam  Vitals reviewed.  Constitutional:  25 y /o female with NG tube in place, no distress, frail appearing/petite Eyes: EOM are normal.  Neck:  Trach tube in place , #6, minimal secretions.  Cardiovascular: Normal rate and regular rhythm.  Pulmonary/Chest: Effort normal and breath sounds normal. No respiratory distress. She has no wheezes. No cough. Abdominal: Soft. Bowel sounds are normal. Appropriately tender Neurological: She is alert.  She has normal reflexes. No cranial nerve deficit. She exhibits normal muscle tone.  Makes good eye contact with examiner.generally pleasant and alert. Follows all simple commands. Strength 3+ to 4/5 UE's. LE's 2- to 2/5 at HF prox to 3+/4 distally. No sensory deficits.  DTR's 1+ Skin:  Surgical site dressed /colostomy in place with moderate gas. Close proximity to healing abdominal wounds which are granulating in. Psychiatric:  Quiet, flat    Results for orders placed during the hospital encounter of 12/31/12 (from the past 48 hour(s))  GLUCOSE, CAPILLARY     Status: Abnormal   Collection Time    01/15/13 12:09 PM      Result Value Range   Glucose-Capillary 116 (*) 70 - 99 mg/dL   Comment 1 Documented in Chart     Comment 2 Notify RN    GLUCOSE, CAPILLARY     Status: Abnormal   Collection Time    01/15/13  5:39 PM      Result Value Range   Glucose-Capillary 124 (*) 70 - 99 mg/dL  GLUCOSE, CAPILLARY     Status: Abnormal   Collection Time    01/15/13  8:20 PM      Result Value Range   Glucose-Capillary 142 (*) 70 - 99 mg/dL   Comment 1 Notify RN     Comment 2 Documented in Chart    GLUCOSE, CAPILLARY     Status: None   Collection Time    01/15/13 11:20 PM      Result Value Range   Glucose-Capillary 96  70 - 99 mg/dL   Comment 1 Notify RN     Comment 2 Documented in Chart    GLUCOSE, CAPILLARY     Status: Abnormal   Collection Time    01/16/13  3:44 AM      Result Value Range   Glucose-Capillary 119 (*) 70 - 99 mg/dL  BASIC METABOLIC PANEL     Status: Abnormal   Collection Time    01/16/13  4:35 AM      Result Value Range   Sodium 133 (*) 135 - 145 mEq/L   Potassium 3.3 (*) 3.5 - 5.1 mEq/L   Chloride 96  96 - 112 mEq/L   CO2 32  19 - 32 mEq/L   Glucose, Bld 142 (*) 70 - 99 mg/dL   BUN 8  6 - 23 mg/dL   Creatinine, Ser 6.04  0.50 - 1.10 mg/dL   Calcium 8.2 (*) 8.4 - 10.5 mg/dL   GFR calc non Af Amer >90  >90 mL/min   GFR calc Af Amer >90  >90 mL/min   Comment:            The eGFR has been calculated     using the CKD EPI equation.     This calculation has not been     validated in all clinical     situations.     eGFR's persistently     <90 mL/min signify     possible Chronic Kidney Disease.  CBC     Status: Abnormal    Collection Time    01/16/13  4:35 AM      Result Value Range   WBC 9.1  4.0 - 10.5 K/uL   RBC 3.37 (*) 3.87 - 5.11 MIL/uL   Hemoglobin 10.1 (*) 12.0 - 15.0 g/dL   HCT 54.0 (*) 98.1 - 19.1 %   MCV 84.0  78.0 - 100.0 fL   MCH 30.0  26.0 -  34.0 pg   MCHC 35.7  30.0 - 36.0 g/dL   RDW 47.8 (*) 29.5 - 62.1 %   Platelets 275  150 - 400 K/uL  GLUCOSE, CAPILLARY     Status: Abnormal   Collection Time    01/16/13  7:44 AM      Result Value Range   Glucose-Capillary 134 (*) 70 - 99 mg/dL   Comment 1 Documented in Chart     Comment 2 Notify RN    GLUCOSE, CAPILLARY     Status: Abnormal   Collection Time    01/16/13 11:25 AM      Result Value Range   Glucose-Capillary 102 (*) 70 - 99 mg/dL   Comment 1 Documented in Chart     Comment 2 Notify RN    GLUCOSE, CAPILLARY     Status: Abnormal   Collection Time    01/16/13  4:12 PM      Result Value Range   Glucose-Capillary 127 (*) 70 - 99 mg/dL   Comment 1 Documented in Chart     Comment 2 Notify RN    GLUCOSE, CAPILLARY     Status: None   Collection Time    01/16/13  7:22 PM      Result Value Range   Glucose-Capillary 98  70 - 99 mg/dL  GLUCOSE, CAPILLARY     Status: Abnormal   Collection Time    01/16/13 11:58 PM      Result Value Range   Glucose-Capillary 131 (*) 70 - 99 mg/dL  GLUCOSE, CAPILLARY     Status: Abnormal   Collection Time    01/17/13  3:33 AM      Result Value Range   Glucose-Capillary 120 (*) 70 - 99 mg/dL   Comment 1 Notify RN     Comment 2 Documented in Chart    GLUCOSE, CAPILLARY     Status: Abnormal   Collection Time    01/17/13  8:51 AM      Result Value Range   Glucose-Capillary 130 (*) 70 - 99 mg/dL   No results found.  Post Admission Physician Evaluation: 1. Functional deficits secondary  to deconditioning after small bowel obstruction requiring ex-lap with lysis of adhesions and ileostomy revision. Pt had post-op respiratory failure as well.  2. Patient is admitted to receive collaborative,  interdisciplinary care between the physiatrist, rehab nursing staff, and therapy team. 3. Patient's level of medical complexity and substantial therapy needs in context of that medical necessity cannot be provided at a lesser intensity of care such as a SNF. 4. Patient has experienced substantial functional loss from his/her baseline which was documented above under the "Functional History" and "Functional Status" headings.  Judging by the patient's diagnosis, physical exam, and functional history, the patient has potential for functional progress which will result in measurable gains while on inpatient rehab.  These gains will be of substantial and practical use upon discharge  in facilitating mobility and self-care at the household level. 5. Physiatrist will provide 24 hour management of medical needs as well as oversight of the therapy plan/treatment and provide guidance as appropriate regarding the interaction of the two. 6. 24 hour rehab nursing will assist with bladder management, bowel management, safety, skin/wound care, disease management, medication administration, pain management and patient education  and help integrate therapy concepts, techniques,education, etc. 7. PT will assess and treat for/with: Lower extremity strength, range of motion, stamina, balance, functional mobility, safety, adaptive techniques and equipment, pain mgt, family ed.  Goals are: supervision to mod I. 8. OT will assess and treat for/with: ADL's, functional mobility, safety, upper extremity strength, adaptive techniques and equipment, pain mgt, family ed.   Goals are: supervision to mod I (perhaps additional help with ostomy care). 9. SLP will assess and treat for/with: speech, swallow, communication.  Goals are: mod I. 10. Case Management and Social Worker will assess and treat for psychological issues and discharge planning. 11. Team conference will be held weekly to assess progress toward goals and to determine  barriers to discharge. 12. Patient will receive at least 3 hours of therapy per day at least 5 days per week. 13. ELOS: 10-12 days      Prognosis:  excellent, still with very poor stamina   Medical Problem List and Plan: 1. Deconditioning after small bowel obstruction. Status post exploratory lap, lysis of adhesions ileostomy revision-postop respiratory failure 2. DVT Prophylaxis/Anticoagulation: Subcutaneous Lovenox. Monitor platelet counts any signs of bleeding 3. Mood: Ativan 0.5 mg each bedtime. Provide emotional support. Family appears quite positive and supportive as well.  4. Neuropsych: This patient is capable of making decisions on his/her own behalf. 5. Dysphagia. Currently n.p.o. Continue nasogastric tube feeds and follow per speech therapy. 6. Respiratory failure. 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 Critical care medicine to followup in regards to downsizing trach tube. Speech therapy working with Passy-Muir valve  -would like to potentially downsize the trach to a #4 on monday 7. History of rectal cancer. No plan for ongoing radiation therapy at this time 8. Ileostomy: immodium, simethicone, formula adjustment  -probiotic also  -bag being emptied q2 hours to help protect abdominal wounds.  Ranelle Oyster, MD, [place] [facility]   01/17/2013

## 2013-01-18 ENCOUNTER — Inpatient Hospital Stay (HOSPITAL_COMMUNITY): Payer: BC Managed Care – PPO | Admitting: *Deleted

## 2013-01-18 ENCOUNTER — Inpatient Hospital Stay (HOSPITAL_COMMUNITY): Payer: BC Managed Care – PPO

## 2013-01-18 ENCOUNTER — Inpatient Hospital Stay (HOSPITAL_COMMUNITY): Payer: BC Managed Care – PPO | Admitting: Speech Pathology

## 2013-01-18 DIAGNOSIS — R5381 Other malaise: Secondary | ICD-10-CM

## 2013-01-18 DIAGNOSIS — K56609 Unspecified intestinal obstruction, unspecified as to partial versus complete obstruction: Secondary | ICD-10-CM

## 2013-01-18 LAB — GLUCOSE, CAPILLARY
Glucose-Capillary: 143 mg/dL — ABNORMAL HIGH (ref 70–99)
Glucose-Capillary: 111 mg/dL — ABNORMAL HIGH (ref 70–99)
Glucose-Capillary: 121 mg/dL — ABNORMAL HIGH (ref 70–99)

## 2013-01-18 MED ORDER — SIMETHICONE 40 MG/0.6ML PO SUSP: 40.0000 mg | Freq: Four times a day (QID) | ORAL | Status: DC

## 2013-01-18 MED ORDER — SIMETHICONE 80 MG PO CHEW
40.0000 mg | CHEWABLE_TABLET | Freq: Four times a day (QID) | ORAL | Status: DC
  Administered 2013-01-18 – 2013-01-19 (×7): 40 mg via ORAL
  Administered 2013-01-19: 10:00:00 via ORAL
  Administered 2013-01-20 – 2013-01-28 (×33): 40 mg via ORAL
  Filled 2013-01-18 (×47): qty 1

## 2013-01-18 MED ORDER — POTASSIUM CHLORIDE IN NACL 40-0.9 MEQ/L-% IV SOLN
INTRAVENOUS | Status: DC
  Administered 2013-01-18: 50 mL/h via INTRAVENOUS
  Administered 2013-01-19 – 2013-01-22 (×4): via INTRAVENOUS
  Filled 2013-01-18 (×6): qty 1000

## 2013-01-18 NOTE — Progress Notes (Signed)
Patient alert and oriented x4.  Nods or shakes head in relation to yes or no answers.  Tracheostomy inner cannula changed at this time without difficulty.  Patient tolerated with minimal coughing.  Suctioned moderate amount of thick, pink-tinged secretions.  Oral care performed.  Dressing to abdominal incision changed.  Scant amount of yellow drainage to old dressing.  Wound bed beefy with purple suture intact.  Patient refused to have Ileostomy bag changed.  Shook head and mouthed "no".  When nurse explained that bag is to be changed Sat, Mon, and Thurs, patient held up hand and mouthed "No, it's ok".  No leaks around ileostomy and area clean.  Bag with scant amount of orange colored stool. CBG 118.  NGT to R nare intact.  Denies pain.  Call bell within reach.    Kelli Hope M

## 2013-01-18 NOTE — Evaluation (Addendum)
Occupational Therapy Assessment and Plan  Patient Details  Name: Renee Nicholson MRN: [id number] Date of Birth: Nov 28, 1965  OT Diagnosis: muscle weakness (generalized) Rehab Potential: Rehab Potential: Good ELOS: 7-10   Today's Date: 01/18/2013 Time  [phone]  (70 MIN) : Time Calculation (min): 70 min  Problem List:  Patient Active Problem List  Diagnosis  . 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 Thyroid mass  . High output ileostomy  . Hx of radiation therapy  . SBO (small bowel obstruction)  . Hypokalemia  . Shock  . SIRS (systemic inflammatory response syndrome)  . Acute respiratory failure  . Tracheostomy status    Past Medical History:  Past Medical History  Diagnosis Date  . Anemia   . Diarrhea   . Anxiety   . Bright red rectal bleeding 09/13/2011  . History of chemotherapy     completed 09/2011   . Hx of radiation therapy 08/29/11 to 10/09/11    rectum  . GERD (gastroesophageal reflux disease)   . Ileostomy in place   . Cancer   . Rectal cancer    Past Surgical History:  Past Surgical History  Procedure Laterality Date  . Tubal ligation    . Carpal tunnel release    . Colonoscopy  07/26/2011    Procedure: COLONOSCOPY;  Surgeon: Arlyce Harman, MD;  Location: [facility];  Service: Endoscopy;  Laterality: N/A;  10:40  . Esophagogastroduodenoscopy  07/26/2011    Procedure: ESOPHAGOGASTRODUODENOSCOPY (EGD);  Surgeon: Arlyce Harman, MD;  Location: [facility];  Service: Endoscopy;  Laterality: N/A;  . Esophagogastroduodenoscopy  12/27/2011    Procedure: ESOPHAGOGASTRODUODENOSCOPY (EGD);  Surgeon: Shirley Friar, MD;  Location: [facility];  Service: Endoscopy;  Laterality: N/A;  . Laparoscopic colon resection  12/14/11    diverting ileostomy  . Portacath placement  02/21/2012    Procedure: INSERTION PORT-A-CATH;  Surgeon: [name], MD;  Location: [facility];  Service: General;  Laterality: N/A;  .  Evaluation under anesthesia with anal fissurotomy N/A 11/06/2012    Procedure: Exam Under Anesthesia , Anal Dilation;  Surgeon: [name], MD;  Location: [facility];  Service: General;  Laterality: N/A;  Exam Under Anesthesia , Anal Dilation  . Laparotomy N/A 01/03/2013    Procedure: EXPLORATORY LAPAROTOMY WITH LYSIS OF ADHESIONS, revision of ileostomy;  Surgeon: [name], MD;  Location: [facility];  Service: General;  Laterality: N/A;    Assessment & Plan Clinical Impression: Renee Nicholson is a 47 y.o. 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 Admitted 12/31/2012 with minimal output from ostomy as well as abdominal and back pain with nausea vomiting. X-rays and imaging consistent with small bowel obstruction. Underwent exploratory laparotomy, lysis of adhesions and ileostomy revision 01/03/2013 for recurrent small bowel obstruction per Dr. Donell Beers. Hospital course complicated by tachycardia and hypotension followed by critical care medicine. Patient required intubation per critical care medicine and self extubated 01/07/2013. Bouts of confusion altered mental status cranial CT scan 01/08/2013 showed no acute changes. Patient with progressive respiratory decline and ultimately required tracheostomy 01/09/2013 and currently has a #6 tracheostomy and plan followup to evaluate to downsize on 01/20/2013. Speech therapy working with Passy-Muir valve. Subcutaneous Lovenox added for DVT prophylaxis. Patient is currently n.p.o. and remains with nasogastric tube feeds for nutritional support. Physical and occupational therapy evaluations completed 01/11/2013 with  recommendations of physical medicine rehabilitation consult. Patient was felt to be a good candidate for inpatient rehabilitation services and was admitted for comprehensive rehabilitation  program  (Addendum: changed inaccurate Clinical Impression)  Patient currently requires min with basic self-care skills secondary to decreased balance strategies.  Prior to hospitalization, patient could complete BADL with independent .  Patient will benefit from skilled intervention to increase independence with basic self-care skills prior to discharge home with care partner.  Anticipate patient will require intermittent supervision and follow up home health.  OT - End of Session Activity Tolerance: Tolerates < 10 min activity with changes in vital signs Endurance Deficit: Yes OT Assessment Rehab Potential: Good Barriers to Discharge: None OT Plan OT Intensity: Minimum of 1-2 x/day, 45 to 90 minutes OT Frequency: 5 out of 7 days OT Duration/Estimated Length of Stay: 7-10 OT Treatment/Interventions: Balance/vestibular training;Discharge planning;DME/adaptive equipment instruction;Functional mobility training;Patient/family education;Self Care/advanced ADL retraining;Therapeutic Activities;Therapeutic Exercise;UE/LE Strength taining/ROM   Skilled Therapeutic Intervention Pt. Presents with general weakness and transfers, decreased BUE strength.  Pt did not dress on eval, but moved from bed to chair with minimal assist.    OT Evaluation Precautions/Restrictions  Precautions Precautions: Fall Precaution Comments: right colostomy, trach Restrictions Weight Bearing Restrictions: No General   Vital Signs:  Oxy=98 %     Pain Pain Assessment Pain Assessment: No/denies pain Home Living/Prior Functioning Home Living Lives With: Spouse;Family Available Help at Discharge: Family Type of Home: House Home Access: Stairs to enter Secretary/administrator of Steps: 4 Entrance Stairs-Rails: Can reach both Home Layout: One level Bathroom Shower/Tub: Engineer, manufacturing systems: Standard Bathroom Accessibility: Yes How Accessible: Accessible via walker Home Adaptive Equipment:  None Additional Comments: equipment used by parents in the home IADL History Homemaking Responsibilities: Yes Meal Prep Responsibility: Primary Laundry Responsibility: Primary Cleaning Responsibility: Primary Bill Paying/Finance Responsibility: No Shopping Responsibility: Secondary Current License: Yes Mode of Transportation: Car Occupation: Works at home 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) Prior Function Level of Independence: Independent with homemaking with ambulation;Independent with basic ADLs;Independent with gait;Independent with transfers Able to Take Stairs?: Reciprically Driving: No Vocation: Self employed ADL ADL Where Assessed-Upper Body Bathing: Bed level Lower Body Bathing: Not assessed Where Assessed-Lower Body Bathing: Bed level Upper Body Dressing: Not assessed Where Assessed-Upper Body Dressing: Edge of bed Lower Body Dressing: Other (Comment) Where Assessed-Lower Body Dressing: Edge of bed Where Assessed-Toileting: Bedside Commode Toilet Transfer: Minimal assistance Toilet Transfer Method: Stand pivot Acupuncturist: Gaffer: Not assessed Vision/Perception  Vision - History Baseline Vision: No visual deficits Vision - Assessment Eye Alignment: Within Functional Limits Perception Perception: Within Functional Limits  Cognition Overall Cognitive Status: Within Functional Limits for tasks assessed Arousal/Alertness: Awake/alert Orientation Level: Oriented X4 Attention: Focused Focused Attention: Appears intact Memory: Appears intact Sensation Sensation Light Touch: Appears Intact Coordination Gross Motor Movements are Fluid and Coordinated: Yes Fine Motor Movements are Fluid and Coordinated: Yes Motor  Motor Motor: Within Functional Limits Mobility  Bed Mobility Bed Mobility: Rolling Right;Rolling Left;Sit to Supine;Scooting to [facility]  Trunk/Postural Assessment  Cervical  Assessment Cervical Assessment: Within Functional Limits Thoracic Assessment Thoracic Assessment: Within Functional Limits Lumbar Assessment Lumbar Assessment: Within Functional Limits Postural Control Postural Control: Within Functional Limits  Balance Balance Balance Assessed: Yes Static Sitting Balance Static Sitting - Balance Support: Feet supported;No upper extremity supported Static Sitting - Level of Assistance: 5: Stand by assistance Dynamic Sitting Balance Dynamic Sitting - Balance Support: Feet supported Dynamic Sitting - Level of  Assistance: 5: Stand by assistance Dynamic Sitting - Balance Activities: Forward lean/weight shifting;Reaching for objects;Reaching across midline Static Standing Balance Static Standing - Balance Support: Bilateral upper extremity supported;During functional activity Static Standing - Level of Assistance: 4: Min assist Extremity/Trunk Assessment RUE Assessment RUE Assessment: Exceptions to [facility] RUE Strength RUE Overall Strength: Deficits RUE Overall Strength Comments: 4/5 Right Shoulder Flexion: 4/5 LUE Assessment LUE Assessment: Exceptions to [facility] LUE Strength LUE Overall Strength: Deficits LUE Overall Strength Comments:  (4/5)  FIM:  FIM - Eating Eating Activity: 1: Helper performs IV, parenteral, or tube feeding FIM - Grooming Grooming Steps: Wash, rinse, dry face;Wash, rinse, dry hands Grooming: 3: Patient completes 2 of 4 or 3 of 5 steps FIM - Bathing Bathing Steps Patient Completed: Chest;Right Arm;Left Arm;Abdomen;Front perineal area;Buttocks;Right upper leg;Left upper leg Bathing: 4: Min-Patient completes 8-9 89f 10 parts or 75+ percent FIM - Upper Body Dressing/Undressing Upper body dressing/undressing: 0: Wears gown/pajamas-no public clothing FIM - Lower Body Dressing/Undressing Lower body dressing/undressing: 0: Wears gown/pajamas-no public clothing FIM - Toileting Toileting steps completed by patient: Adjust clothing prior  to toileting;Performs perineal hygiene;Adjust clothing after toileting Toileting: 5: Supervision: Safety issues/verbal cues FIM - Bed/Chair Transfer Bed/Chair Transfer Assistive Devices: Bed rails Bed/Chair Transfer: 5: Supine > Sit: Supervision (verbal cues/safety issues);5: Sit > Supine: Supervision (verbal cues/safety issues);4: Bed > Chair or W/C: Min A (steadying Pt. > 75%);4: Chair or W/C > Bed: Min A (steadying Pt. > 75%) FIM - Diplomatic Services operational officer Devices: Psychiatrist Transfers: 4-To toilet/BSC: Min A (steadying Pt. > 75%);4-From toilet/BSC: Min A (steadying Pt. > 75%) FIM - Tub/Shower Transfers Tub/shower Transfers: 0-Activity did not occur or was simulated   Refer to Care Plan for Long Term Goals  Recommendations for other services: None  Discharge Criteria: Patient will be discharged from OT if patient refuses treatment 3 consecutive times without medical reason, if treatment goals not met, if there is a change in medical status, if patient makes no progress towards goals or if patient is discharged from hospital.  The above assessment, treatment plan, treatment alternatives and goals were discussed and mutually agreed upon: by patient and by family  Humberto Seals 01/18/2013, 7:48 PM

## 2013-01-18 NOTE — Plan of Care (Signed)
Overall Plan of Care [facility]) Patient Details Name: Renee Nicholson MRN: [id number] DOB: [birth year]  Diagnosis:  Severe deconditioning  Co-morbidities: SBO, rectal cancer, respiratory failure  Functional Problem List  Patient demonstrates impairments in the following areas: Balance, Behavior, Bladder, Bowel, Edema, Medication Management, Nutrition, Pain and Safety  Basic ADL's: grooming, bathing, dressing, toileting and transfers to toilet and shower Advanced ADL's: simple meal preparation  Transfers:  bed mobility, bed to chair, toilet, tub/shower, car and furniture Locomotion:  ambulation  Additional Impairments:  None  Anticipated Outcomes Item Anticipated Outcome  Eating/Swallowing  Independent  Basic self-care  Mod I  Tolieting  Mod I  Bowel/Bladder  Cont of bladder/ had ileostomy  Transfers  Mod I  Locomotion  Mod I  Communication  Independent  Cognition    Pain  Less than 3  Safety/Judgment  Free of fall  Other     PT Intensity: Minimum of 1-2 x/day ,45 to 90 minutes PT Frequency: 5 out of 7 days PT Duration Estimated Length of Stay: 7-14 days ST Intensity - 1 time per day OT Intensity: Minimum of 1-2 x/day, 45 to 90 minutes OT Frequency: 5 out of 7 days OT Duration/Estimated Length of Stay: 7-10 SLP Intensity: Minumum of 1-2 x/day, 30 to 90 minutes SLP Frequency: 5 out of 7 days SLP Duration/Estimated Length of Stay: Two weeks    Team Interventions: Item RN PT OT SLP SW TR Other  Self Care/Advanced ADL Retraining   X      Neuromuscular Re-Education         Therapeutic Activities   X x     UE/LE Strength Training/ROM   X      UE/LE Coordination Activities         Visual/Perceptual Remediation/Compensation         DME/Adaptive Equipment Instruction   X      Therapeutic Exercise   X      Balance/Vestibular Training   X      Patient/Family Education x  X x     Cognitive Remediation/Compensation    x     Functional Mobility Training   X       Ambulation/Gait Retail banker    X     Bladder Management x        Bowel Management         Disease Management/Prevention xx        Pain Management x        Medication Management x        Skin Care/Wound Management x        Splinting/Orthotics         Discharge Planning x  X X     Psychosocial Support x  X X                            Team Discharge Planning: Destination: PT-  ,OT-HOME   , SLP-Home Projected Follow-up: PT- , OT-HOMEHEALTH   , SLP-Home Health SLP Projected Equipment Needs: PT- , OT-SHOWER SEAT  , SLP-None recommended by SLP Patient/family involved in discharge planning: PT-  ,  OT-Patient;Family member/caregiver, SLP-Patient;Family member/caregiver  MD ELOS: 10-12 days Medical Rehab Prognosis:  Excellent Assessment: The patient has been admitted for CIR therapies. The team will be addressing, functional mobility, strength, stamina, balance, safety, adaptive techniques/equipment, self-care, bowel and bladder mgt, patient and caregiver education, pain mgt, ostomy mgt, ego supports. Goals have been set at modified independent except for ostomy care which will be min assist. .    Ranelle Oyster, MD, [facility]      See Team Conference Notes for weekly updates to the plan of care

## 2013-01-18 NOTE — Evaluation (Signed)
Physical Therapy Assessment and Plan  Patient Details  Name: Renee Nicholson MRN: [id number] Date of Birth: 06-09-1966  PT Diagnosis: Muscle weakness Rehab Potential: Fair ELOS: 7-14 days   Today's Date: 01/18/2013 Time:1100-1200, 1250-1320  First session 1100-1200(60 min) Patient is non verbal,tracheostomy, naso-gastral feeding, O2 dep.Patient does not present any pain pre/during or post therapy. Transfer training chair to w/c to bed side commode, with min A,needed mainly due to initial LOB. Sitting balance training included reaching for objects, donning and doffing gown. Static standing balance, poor knee wobble. Gait training on a distance of 250 feet with min A and w/c follow. Patient present with decreased step length and velocity, forward trunk lean. Attempted w/c mobility ,patient does not attempt to propel herself even in the room, Second session 1250-1320 Bed mobility training , rolling, supine to sit and sitting EOB. Dynamic activities in sitting included reaching for objects, turning to both sides , maintaining sitting balance against resistance. In supine:SLR 2 x 10, Bridging 2 x10, SAQ 2 x 15. Patient education on safety and energy conservation techniques. Problem List:  Patient Active Problem List  Diagnosis  . 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 Thyroid mass  . High output ileostomy  . Hx of radiation therapy  . SBO (small bowel obstruction)  . Hypokalemia  . Shock  . SIRS (systemic inflammatory response syndrome)  . Acute respiratory failure  . Tracheostomy status    Past Medical History:  Past Medical History  Diagnosis Date  . Anemia   . Diarrhea   . Anxiety   . Bright red rectal bleeding 09/13/2011  . History of chemotherapy     completed 09/2011   . Hx of radiation therapy 08/29/11 to 10/09/11    rectum  . GERD (gastroesophageal reflux disease)   . Ileostomy in place   . Cancer   . Rectal cancer     Past Surgical History:  Past Surgical History  Procedure Laterality Date  . Tubal ligation    . Carpal tunnel release    . Colonoscopy  07/26/2011    Procedure: COLONOSCOPY;  Surgeon: Arlyce Harman, MD;  Location: [facility];  Service: Endoscopy;  Laterality: N/A;  10:40  . Esophagogastroduodenoscopy  07/26/2011    Procedure: ESOPHAGOGASTRODUODENOSCOPY (EGD);  Surgeon: Arlyce Harman, MD;  Location: [facility];  Service: Endoscopy;  Laterality: N/A;  . Esophagogastroduodenoscopy  12/27/2011    Procedure: ESOPHAGOGASTRODUODENOSCOPY (EGD);  Surgeon: Shirley Friar, MD;  Location: [facility];  Service: Endoscopy;  Laterality: N/A;  . Laparoscopic colon resection  12/14/11    diverting ileostomy  . Portacath placement  02/21/2012    Procedure: INSERTION PORT-A-CATH;  Surgeon: [name], MD;  Location: [facility];  Service: General;  Laterality: N/A;  . Evaluation under anesthesia with anal fissurotomy N/A 11/06/2012    Procedure: Exam Under Anesthesia , Anal Dilation;  Surgeon: [name], MD;  Location: [facility];  Service: General;  Laterality: N/A;  Exam Under Anesthesia , Anal Dilation  . Laparotomy N/A 01/03/2013    Procedure: EXPLORATORY LAPAROTOMY WITH LYSIS OF ADHESIONS, revision of ileostomy;  Surgeon: [name], MD;  Location: [facility];  Service: General;  Laterality: N/A;    Assessment & Plan Clinical Impression:Renee Nicholson is a 47 y.o. 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 Admitted 12/31/2012 with minimal output  from ostomy as well as abdominal and back pain with nausea vomiting. X-rays and imaging consistent with small bowel obstruction. Underwent exploratory laparotomy, lysis of adhesions and ileostomy revision 01/03/2013 for recurrent small bowel obstruction per Dr. Donell Beers. Hospital course complicated  by tachycardia and hypotension followed by critical care medicine. Patient required intubation per critical care medicine and self extubated 01/07/2013. Bouts of confusion altered mental status cranial CT scan 01/08/2013 showed no acute changes. Patient with progressive respiratory decline and ultimately required tracheostomy 01/09/2013 and currently has a #6 tracheostomy and plan followup to evaluate to downsize on 01/20/2013.   Patient currently requires min with mobility secondary to decreased standing balance poor endurance.  Prior to hospitalization, patient was independent  with mobility and lived with Spouse;Family in a House home.  Home access is 4Stairs to enter.  Patient will benefit from skilled PT intervention to maximize safe functional mobility, minimize fall risk and decrease caregiver burden for planned discharge home with 24 hour supervision.  Anticipate patient will benefit from follow up HH at discharge.  PT - End of Session Activity Tolerance: Decreased this session;Tolerates < 10 min activity, no significant change in vital signs Endurance Deficit: Yes PT Assessment Rehab Potential: Fair Barriers to Discharge: Other (comment) PT Plan PT Intensity: Minimum of 1-2 x/day ,45 to 90 minutes PT Frequency: 5 out of 7 days PT Duration Estimated Length of Stay: 7-14 days PT Treatment/Interventions: Ambulation/gait training;Stair training;Therapeutic Exercise;Therapeutic Activities;Functional mobility training;Wheelchair propulsion/positioning  Skilled Therapeutic Intervention   PT Evaluation Precautions/Restrictions Precautions Precautions: Fall Restrictions Weight Bearing Restrictions: No General Chart Reviewed: Yes Vital Signs  Pain Pain Assessment Pain Assessment: No/denies pain PAINAD (Pain Assessment in Advanced Dementia) Breathing: normal Negative Vocalization: none Facial Expression: smiling or inexpressive Body Language: relaxed Consolability: no need to  console PAINAD Score: 0 Home Living/Prior Functioning Home Living Lives With: Spouse;Family Available Help at Discharge: Family Type of Home: House Home Access: Stairs to enter Secretary/administrator of Steps: 4 Entrance Stairs-Rails: Can reach both Home Layout: One level Bathroom Shower/Tub: Engineer, manufacturing systems: Standard Bathroom Accessibility: Yes How Accessible: Accessible via walker Prior Function Level of Independence: Independent with homemaking with ambulation;Independent with basic ADLs;Independent with gait;Independent with transfers Able to Take Stairs?: Reciprically Driving: No Vision/Perception  Vision - History Baseline Vision: No visual deficits  Cognition Overall Cognitive Status: Difficult to assess Arousal/Alertness: Awake/alert Orientation Level: Oriented X4 Attention: Focused Focused Attention: Appears intact Memory: Appears intact Sensation Sensation Light Touch: Appears Intact Stereognosis: Appears Intact Hot/Cold: Appears Intact Proprioception: Appears Intact Coordination Gross Motor Movements are Fluid and Coordinated: Yes Fine Motor Movements are Fluid and Coordinated: Yes Motor  Motor Motor: Within Functional Limits  Mobility Bed Mobility Bed Mobility: Rolling Right;Rolling Left;Sit to Supine;Scooting to [facility] Rolling Right: 4: Min guard;5: Supervision Rolling Right: Patient Percentage: 40% Rolling Left: 5: Supervision Right Sidelying to Sit: 4: Min assist Supine to Sit: 5: Supervision Sitting - Scoot to Edge of Bed: 5: Supervision Sit to Supine: 5: Supervision Transfers Sit to Stand: 4: Min guard;From chair/3-in-1;From bed;With armrests Sit to Stand Details (indicate cue type and reason): LOB on initial stance Stand to Sit: 4: Min guard;To chair/3-in-1;To bed Locomotion  Ambulation Ambulation: Yes Ambulation/Gait Assistance: 4: Min assist Ambulation Distance (Feet): 250 Feet Assistive device: Rolling  walker Ambulation/Gait Assistance Details: Verbal cues for gait pattern Ambulation/Gait Assistance Details: Patient present with decreased step lenght,width and velocity, LOB Gait Gait: Yes Gait Pattern: Decreased stride length;Decreased trunk rotation;Trunk flexed High Level Ambulation High Level Ambulation: Direction  changes Direction Changes: Difficulty in changing directions, LOB with transitional movements Stairs / Additional Locomotion Stairs: No Wheelchair Mobility Wheelchair Mobility: Yes Wheelchair Assistance: 2: Max Chiropodist Details: Verbal cues for sequencing;Verbal cues for Diplomatic Services operational officer: Both upper extremities  Trunk/Postural Assessment  Cervical Assessment Cervical Assessment: Within Lobbyist Thoracic Assessment: Within Functional Limits Lumbar Assessment Lumbar Assessment: Within Functional Limits Postural Control Postural Control: Within Functional Limits  Balance Balance Balance Assessed: Yes Static Sitting Balance Static Sitting - Balance Support: Feet supported;No upper extremity supported Static Sitting - Level of Assistance: 5: Stand by assistance Static Sitting - Comment/# of Minutes: Patient was able to maintain sitting osition with B feet supported for 5 min. Dynamic Sitting Balance Dynamic Sitting - Balance Support: Feet supported Dynamic Sitting - Level of Assistance: 5: Stand by assistance Dynamic Sitting - Balance Activities: Forward lean/weight shifting;Reaching for objects;Reaching across midline Static Standing Balance Static Standing - Balance Support: Bilateral upper extremity supported;During functional activity Static Standing - Level of Assistance: 4: Min assist Extremity Assessment      RLE Assessment RLE Assessment: Exceptions to [facility] RLE Strength RLE Overall Strength: Deficits LLE Assessment LLE Assessment: Exceptions to WFL LLE Strength LLE Overall Strength:  Deficits  FIM:  FIM - Bed/Chair Transfer Bed/Chair Transfer: 5: Supine > Sit: Supervision (verbal cues/safety issues);5: Sit > Supine: Supervision (verbal cues/safety issues);4: Bed > Chair or W/C: Min A (steadying Pt. > 75%);4: Chair or W/C > Bed: Min A (steadying Pt. > 75%) FIM - Locomotion: Wheelchair Locomotion: Wheelchair: 1: Travels less than 50 ft with maximal assistance (Pt: 25 - 49%) FIM - Locomotion: Ambulation Locomotion: Ambulation Assistive Devices: Designer, industrial/product Ambulation/Gait Assistance: 4: Min assist Locomotion: Ambulation: 4: Travels 150 ft or more with minimal assistance (Pt.>75%) FIM - Locomotion: Stairs Locomotion: Stairs: 0: Activity did not occur   Refer to Care Plan for Long Term Goals  Recommendations for other services: None  Discharge Criteria: Patient will be discharged from PT if patient refuses treatment 3 consecutive times without medical reason, if treatment goals not met, if there is a change in medical status, if patient makes no progress towards goals or if patient is discharged from hospital.  The above assessment, treatment plan, treatment alternatives and goals were discussed and mutually agreed upon: patient and family member.  Valda Lamb W 01/18/2013, 1:25 PM

## 2013-01-18 NOTE — Progress Notes (Signed)
Patient ID: Renee Nicholson, female   DOB: [birth year], 47 y.o.   MRN: [id number]  25/80.  47 year old patient with a history of rectal cancer and radiation therapy, status post total colectomy with diverging loop ileostomy in March of 2013. She has had recurrent small bowel obstructions and was admitted on April 8 with recurrent small bowel obstruction. Hospital course complicated by acute respiratory failure requiring tracheostomy and the patient has been followed by critical care medicine. The patient is receiving enteral nutrition with additional free water but continues to have high output from her ileostomy. Patient's sister-in-law was concerned about dehydration since IV fluids have been discontinued.  CBG (last 3)   Recent Labs  01/18/13 0017 01/18/13 0410 01/18/13 0857  GLUCAP 143* 111* 121*    General exam- alert frail status post tracheostomy HEENT- nasogastric tube in place Chest clear Cardiovascular no tachycardia Abdomen nondistended; status post ileostomy Extremities thin no swelling with significant muscle wasting  Impression-severe deconditioning status post small bowel obstruction with exploratory laparotomy and lysis of adhesions Postop respiratory failure  Will check Bmet in the morning; I&O yesterday -1950 but 1300 cc was urine output     Author: Ranelle Oyster, MD Service: Physical Medicine and Rehabilitation Author Type: Physician    Filed: 01/17/2013  4:16 PM Note Time: 01/17/2013  9:57 AM           Physical Medicine and Rehabilitation Admission H&P      Chief Complaint   Patient presents with   .  Abdominal Pain    : HPI: Renee Nicholson is a 47 y.o. 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 Admitted 12/31/2012 with minimal output from ostomy as well as abdominal and back pain with nausea  vomiting. X-rays and imaging consistent with small bowel obstruction. Underwent exploratory laparotomy, lysis of adhesions and ileostomy revision 01/03/2013 for recurrent small bowel obstruction per Dr. Donell Beers. Hospital course complicated by tachycardia and hypotension followed by critical care medicine. Patient required intubation  per critical care medicine and self extubated 01/07/2013. Bouts of confusion altered mental status cranial CT scan 01/08/2013 showed no acute changes. Patient with progressive respiratory decline and ultimately required tracheostomy 01/09/2013 and currently has a #6 tracheostomy and plan followup to evaluate to downsize on 01/20/2013. Speech therapy working with Passy-Muir valve. Subcutaneous Lovenox added for DVT prophylaxis. Patient is currently n.p.o. and remains with nasogastric tube feeds for nutritional support. Physical and occupational therapy evaluations completed 01/11/2013 with recommendations of physical medicine rehabilitation consult. Patient was felt to be a good candidate for inpatient rehabilitation services and was admitted for comprehensive rehabilitation program   Review of Systems   Gastrointestinal: Positive for nausea, vomiting and abdominal pain.   Reflux   Psychiatric/Behavioral:   Anxiety   All other systems reviewed and are negative      Past Medical History   Diagnosis  Date   .  Anemia     .  Diarrhea     .  Anxiety     .  Bright red rectal bleeding  09/13/2011   .  History of chemotherapy         completed 09/2011    .  Hx of radiation therapy  08/29/11 to 10/09/11       rectum   .  GERD (gastroesophageal reflux disease)     .  Ileostomy in place     .  Cancer     .  Rectal cancer         Past Surgical History   Procedure  Laterality  Date   .  Tubal ligation       .  Carpal tunnel release       .  Colonoscopy    07/26/2011       Procedure: COLONOSCOPY;  Surgeon: Arlyce Harman, MD;  Location: [facility];  Service: Endoscopy;   Laterality: N/A;  10:40   .  Esophagogastroduodenoscopy    07/26/2011       Procedure: ESOPHAGOGASTRODUODENOSCOPY (EGD);  Surgeon: Arlyce Harman, MD;  Location: [facility];  Service: Endoscopy;  Laterality: N/A;   .  Esophagogastroduodenoscopy    12/27/2011       Procedure: ESOPHAGOGASTRODUODENOSCOPY (EGD);  Surgeon: Shirley Friar, MD;  Location: [facility];  Service: Endoscopy;  Laterality: N/A;   .  Laparoscopic colon resection    12/14/11       diverting ileostomy   .  Portacath placement    02/21/2012       Procedure: INSERTION PORT-A-CATH;  Surgeon: [name], MD;  Location: [facility];  Service: General;  Laterality: N/A;   .  Evaluation under anesthesia with anal fissurotomy  N/A  11/06/2012       Procedure: Exam Under Anesthesia , Anal Dilation;  Surgeon: [name], MD;  Location: [facility];  Service: General;  Laterality: N/A;  Exam Under Anesthesia , Anal Dilation   .  Laparotomy  N/A  01/03/2013       Procedure: EXPLORATORY LAPAROTOMY WITH LYSIS OF ADHESIONS, revision of ileostomy;  Surgeon: [name], MD;  Location: [facility];  Service: General;  Laterality: N/A;       Family History   Problem  Relation  Age of Onset   .  Diabetes  Mother     .  Colon cancer  Neg Hx     .  Cancer  Brother  35       rectal cancer lives in [place]      Social History:  reports that she has never smoked. She has never used smokeless tobacco. She reports that she does not drink alcohol or use illicit drugs. Allergies: No Known Allergies Medications Prior to Admission   Medication  Sig  Dispense  Refill   .  B Complex-C (B-COMPLEX WITH VITAMIN C) tablet  Take 1 tablet by mouth daily.         .  ferrous sulfate 325 (65 FE) MG tablet  Take 325 mg by mouth daily with breakfast.         .  lactose free nutrition (BOOST) LIQD  Take 237 mLs by mouth 2 (two) times daily.         .  Multiple Vitamin (MULTIVITAMIN) tablet  Take 1 tablet by mouth every morning.          .  pantoprazole (PROTONIX) 40 MG  tablet  Take 40 mg by mouth every morning.          .  potassium chloride SA (K-DUR,KLOR-CON) 20 MEQ tablet  Take 20 mEq by mouth every morning.         .  Simethicone 125 MG CAPS  Take 1 capsule by mouth 4 (four) times daily as needed (for gas or upset stomach).               Home: Home Living Lives With: Spouse Available Help at Discharge: Family;Available  24 hours/day Type of Home: House Home Access: Stairs to enter [employer] of Steps: 4 Entrance Stairs-Rails: Can reach both;Right;Left Home Layout: One level Home Adaptive Equipment: Walker - rolling;Straight cane Additional Comments: equipment used by parents in the home    Functional History:     Functional Status:   Mobility: Bed Mobility Bed Mobility: Supine to Sit Rolling Right: With rail;4: Min assist Rolling Right: Patient Percentage: 30% Right Sidelying to Sit: 4: Min assist;With rails Right Sidelying to Sit: Patient Percentage: 50% Supine to Sit: 5: Supervision;HOB elevated;With rails Sitting - Scoot to Edge of Bed: 5: Supervision Sit to Supine: 5: Supervision;HOB elevated Scooting to HOB: 1: +2 Total assist (with draw pad) Transfers Transfers: Sit to Stand;Stand to Sit Sit to Stand: 4: Min assist;With upper extremity assist;From bed Sit to Stand: Patient Percentage: 40% Stand to Sit: 4: Min assist;With upper extremity assist;To bed Squat Pivot Transfers: 1: +2 Total assist Squat Pivot Transfers: Patient Percentage: 30% Ambulation/Gait Ambulation/Gait Assistance: 4: Min assist Ambulation/Gait: Patient Percentage: 50% Ambulation Distance (Feet): 300 Feet Assistive device: Rolling walker Ambulation/Gait Assistance Details: Verbal cues for hand placement on rolling walker.  Pt with several losses of balance posteriorly. Gait Pattern: Step-through pattern;Decreased stride length;Decreased trunk rotation Gait velocity: decr Stairs: No Wheelchair Mobility Wheelchair Mobility:  No   ADL: ADL Toilet Transfer: +2 Total assistance Transfers/Ambulation Related to ADLs: pt shifting weight and attempting to progress ambulation this session without initiation from therapist ADL Comments: Pt requried total (A) for ostomy bag and foley prior to starting. Sister in law present translating.    Cognition: Cognition Overall Cognitive Status: Difficult to assess Arousal/Alertness: Awake/alert Orientation Level: Oriented to person;Oriented to place;Oriented to time;Oriented to situation Cognition Arousal/Alertness: Awake/alert Behavior During Therapy: WFL for tasks assessed/performed Overall Cognitive Status: Difficult to assess Difficult to assess due to: Tracheostomy;Non-English speaking   Physical Exam: Blood pressure 120/60, pulse 75, temperature 97.8 F (36.6 C), temperature source Oral, resp. rate 16, height 4\' 4"  (1.321 m), weight 35.925 kg (79 lb 3.2 oz), last menstrual period 02/20/2012, SpO2 100.00%. Physical Exam  Vitals reviewed.   Constitutional:  75 y /o female with NG tube in place, no distress, frail appearing/petite Eyes: EOM are normal.   Neck:  Trach tube in place , #6, minimal secretions.   Cardiovascular: Normal rate and regular rhythm.   Pulmonary/Chest: Effort normal and breath sounds normal. No respiratory distress. She has no wheezes. No cough. Abdominal: Soft. Bowel sounds are normal. Appropriately tender Neurological: She is alert. She has normal reflexes. No cranial nerve deficit. She exhibits normal muscle tone.  Makes good eye contact with examiner.generally pleasant and alert. Follows all simple commands. Strength 3+ to 4/5 UE's. LE's 2- to 2/5 at HF prox to 3+/4 distally. No sensory deficits. DTR's 1+ Skin:  Surgical site dressed /colostomy in place with moderate gas. Close proximity to healing abdominal wounds which are granulating in. Psychiatric:  Quiet, flat       Results for orders placed during the hospital encounter of  12/31/12 (from the past 48 hour(s))   GLUCOSE, CAPILLARY     Status: Abnormal     Collection Time      01/15/13 12:09 PM       Result  Value  Range     Glucose-Capillary  116 (*)  70 - 99 mg/dL     Comment 1  Documented in Chart        Comment 2  Notify RN      GLUCOSE,  CAPILLARY     Status: Abnormal     Collection Time      01/15/13  5:39 PM       Result  Value  Range     Glucose-Capillary  124 (*)  70 - 99 mg/dL   GLUCOSE, CAPILLARY     Status: Abnormal     Collection Time      01/15/13  8:20 PM       Result  Value  Range     Glucose-Capillary  142 (*)  70 - 99 mg/dL     Comment 1  Notify RN        Comment 2  Documented in Chart      GLUCOSE, CAPILLARY     Status: None     Collection Time      01/15/13 11:20 PM       Result  Value  Range     Glucose-Capillary  96   70 - 99 mg/dL     Comment 1  Notify RN        Comment 2  Documented in Chart      GLUCOSE, CAPILLARY     Status: Abnormal     Collection Time      01/16/13  3:44 AM       Result  Value  Range     Glucose-Capillary  119 (*)  70 - 99 mg/dL   BASIC METABOLIC PANEL     Status: Abnormal     Collection Time      01/16/13  4:35 AM       Result  Value  Range     Sodium  133 (*)  135 - 145 mEq/L     Potassium  3.3 (*)  3.5 - 5.1 mEq/L     Chloride  96   96 - 112 mEq/L     CO2  32   19 - 32 mEq/L     Glucose, Bld  142 (*)  70 - 99 mg/dL     BUN  8   6 - 23 mg/dL     Creatinine, Ser  1.61   0.50 - 1.10 mg/dL     Calcium  8.2 (*)  8.4 - 10.5 mg/dL     GFR calc non Af Amer  >90   >90 mL/min     GFR calc Af Amer  >90   >90 mL/min     Comment:                The eGFR has been calculated        using the CKD EPI equation.        This calculation has not been        validated in all clinical        situations.        eGFR's persistently        <90 mL/min signify        possible Chronic Kidney Disease.   CBC     Status: Abnormal     Collection Time      01/16/13  4:35 AM       Result  Value  Range     WBC  9.1    4.0 - 10.5 K/uL     RBC  3.37 (*)  3.87 - 5.11 MIL/uL     Hemoglobin  10.1 (*)  12.0 - 15.0 g/dL     HCT  09.6 (*)  04.5 - 46.0 %  MCV  84.0   78.0 - 100.0 fL     MCH  30.0   26.0 - 34.0 pg     MCHC  35.7   30.0 - 36.0 g/dL     RDW  45.4 (*)  09.8 - 15.5 %     Platelets  275   150 - 400 K/uL   GLUCOSE, CAPILLARY     Status: Abnormal     Collection Time      01/16/13  7:44 AM       Result  Value  Range     Glucose-Capillary  134 (*)  70 - 99 mg/dL     Comment 1  Documented in Chart        Comment 2  Notify RN      GLUCOSE, CAPILLARY     Status: Abnormal     Collection Time      01/16/13 11:25 AM       Result  Value  Range     Glucose-Capillary  102 (*)  70 - 99 mg/dL     Comment 1  Documented in Chart        Comment 2  Notify RN      GLUCOSE, CAPILLARY     Status: Abnormal     Collection Time      01/16/13  4:12 PM       Result  Value  Range     Glucose-Capillary  127 (*)  70 - 99 mg/dL     Comment 1  Documented in Chart        Comment 2  Notify RN      GLUCOSE, CAPILLARY     Status: None     Collection Time      01/16/13  7:22 PM       Result  Value  Range     Glucose-Capillary  98   70 - 99 mg/dL   GLUCOSE, CAPILLARY     Status: Abnormal     Collection Time      01/16/13 11:58 PM       Result  Value  Range     Glucose-Capillary  131 (*)  70 - 99 mg/dL   GLUCOSE, CAPILLARY     Status: Abnormal     Collection Time      01/17/13  3:33 AM       Result  Value  Range     Glucose-Capillary  120 (*)  70 - 99 mg/dL     Comment 1  Notify RN        Comment 2  Documented in Chart      GLUCOSE, CAPILLARY     Status: Abnormal     Collection Time      01/17/13  8:51 AM       Result  Value  Range     Glucose-Capillary  130 (*)  70 - 99 mg/dL      No results found.   Post Admission Physician Evaluation: Functional deficits secondary  to deconditioning after small bowel obstruction requiring ex-lap with lysis of adhesions and ileostomy revision. Pt had post-op respiratory  failure as well.   Patient is admitted to receive collaborative, interdisciplinary care between the physiatrist, rehab nursing staff, and therapy team. Patient's level of medical complexity and substantial therapy needs in context of that medical necessity cannot be provided at a lesser intensity of care such as a SNF. Patient has experienced substantial functional loss from his/her baseline which  was documented above under the "Functional History" and "Functional Status" headings.  Judging by the patient's diagnosis, physical exam, and functional history, the patient has potential for functional progress which will result in measurable gains while on inpatient rehab.  These gains will be of substantial and practical use upon discharge  in facilitating mobility and self-care at the household level. Physiatrist will provide 24 hour management of medical needs as well as oversight of the therapy plan/treatment and provide guidance as appropriate regarding the interaction of the two. 24 hour rehab nursing will assist with bladder management, bowel management, safety, skin/wound care, disease management, medication administration, pain management and patient education  and help integrate therapy concepts, techniques,education, etc. PT will assess and treat for/with: Lower extremity strength, range of motion, stamina, balance, functional mobility, safety, adaptive techniques and equipment, pain mgt, family ed.   Goals are: supervision to mod I. OT will assess and treat for/with: ADL's, functional mobility, safety, upper extremity strength, adaptive techniques and equipment, pain mgt, family ed.   Goals are: supervision to mod I (perhaps additional help with ostomy care). SLP will assess and treat for/with: speech, swallow, communication.  Goals are: mod I. Case Management and Social Worker will assess and treat for psychological issues and discharge planning. Team conference will be held weekly to assess  progress toward goals and to determine barriers to discharge. Patient will receive at least 3 hours of therapy per day at least 5 days per week. ELOS: 10-12 days      Prognosis:  excellent, still with very poor stamina     Medical Problem List and Plan: 1. Deconditioning after small bowel obstruction. Status post exploratory lap, lysis of adhesions ileostomy revision-postop respiratory failure 2. DVT Prophylaxis/Anticoagulation: Subcutaneous Lovenox. Monitor platelet counts any signs of bleeding 3. Mood: Ativan 0.5 mg each bedtime. Provide emotional support. Family appears quite positive and supportive as well.   4. Neuropsych: This patient is capable of making decisions on his/her own behalf. 5. Dysphagia. Currently n.p.o. Continue nasogastric tube feeds and follow per speech therapy. 6. Respiratory failure. 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 Critical care medicine to followup in regards to downsizing trach tube. Speech therapy working with Passy-Muir valve             -would like to potentially downsize the trach to a #4 on monday 7. History of rectal cancer. No plan for ongoing radiation therapy at this time 8. Ileostomy: immodium, simethicone, formula adjustment             -probiotic also             -bag being emptied q2 hours to help protect abdominal wounds.   Ranelle Oyster, MD, [place] [facility]     01/17/2013

## 2013-01-18 NOTE — Evaluation (Signed)
Speech Language Pathology Assessment and Plan  Patient Details  Name: Renee Nicholson MRN: [id number] Date of Birth: 1965/12/17  SLP Diagnosis: Dysphagia;Speech and Language deficits  Rehab Potential: Good ELOS: Two weeks   Today's Date: 01/18/2013 Time: 7829-5621 Time Calculation (min): 40 min  Problem List:  Patient Active Problem List  Diagnosis  . 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 Thyroid mass  . High output ileostomy  . Hx of radiation therapy  . SBO (small bowel obstruction)  . Hypokalemia  . Shock  . SIRS (systemic inflammatory response syndrome)  . Acute respiratory failure  . Tracheostomy status   Past Medical History:  Past Medical History  Diagnosis Date  . Anemia   . Diarrhea   . Anxiety   . Bright red rectal bleeding 09/13/2011  . History of chemotherapy     completed 09/2011   . Hx of radiation therapy 08/29/11 to 10/09/11    rectum  . GERD (gastroesophageal reflux disease)   . Ileostomy in place   . Cancer   . Rectal cancer    Past Surgical History:  Past Surgical History  Procedure Laterality Date  . Tubal ligation    . Carpal tunnel release    . Colonoscopy  07/26/2011    Procedure: COLONOSCOPY;  Surgeon: Arlyce Harman, MD;  [facility]: [facility];  Service: Endoscopy;  Laterality: N/A;  10:40  . Esophagogastroduodenoscopy  07/26/2011    Procedure: ESOPHAGOGASTRODUODENOSCOPY (EGD);  Surgeon: Arlyce Harman, MD;  [facility]: [facility];  Service: Endoscopy;  Laterality: N/A;  . Esophagogastroduodenoscopy  12/27/2011    Procedure: ESOPHAGOGASTRODUODENOSCOPY (EGD);  Surgeon: Shirley Friar, MD;  Location: [facility];  Service: Endoscopy;  Laterality: N/A;  . Laparoscopic colon resection  12/14/11    diverting ileostomy  . Portacath placement  02/21/2012    Procedure: INSERTION PORT-A-CATH;  Surgeon: [name], MD;  Location: [facility];  Service: General;  Laterality: N/A;  .  Evaluation under anesthesia with anal fissurotomy N/A 11/06/2012    Procedure: Exam Under Anesthesia , Anal Dilation;  Surgeon: [name], MD;  Location: [facility];  Service: General;  Laterality: N/A;  Exam Under Anesthesia , Anal Dilation  . 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  She was recently admitted December 31, 2012, with minimal ostomy output, abdominal and back pain, with nausea and vomiting.  Studies revealed small bowel obstruction.  She had surgical intervention that was complicated by tachycardia and hypotension.  The patient required intubation, and she self-extubated on January 07, 2013.  She had bouts of confusion, but CT scan showed no acute changes.  On April 17,2014, she had a #6 tracheostomy placed, and there is a plan to downsize this on January 20, 2013.  Passy-Muir valve was tried prior to admission to inpatient rehabilitation.  The patient is receiving nutrition via Dobhoff tube.   The patient now presents for continued inpatient rehabilitation services.  Cognition appears to be [facility] as evidenced by 100% accuracy during evaluation.  It should be noted, however, that patient's sister-in-law was present during evaluation and served as  an interpreter for majority of testing.  Her native language is [place], and an interpreter will be necessary if family not present.  If she did not understand information clinician presented, she would look at sister-in-law for translation, and then answer appropriately.  However, higher level reasoning, judgment, and problem solving skills could not be  adequately assessed.   The patient does, however, require skilled intervention  for  verbal expression and dysphagia.  PMV trial was done, but the patient could not phonate, although she did try hard to do so.  Oxygen saturation remained at 96% or above, and she did not appear in distress.  History and Physical states that tracheostomy downsize is planned for January 20, 2013.  The patient will also require dysphagia therapy secondary to tracheostomy and traumatic extubation.   Delayed swallow initiation and decreased hyolaryngeal elevation were also noted.  Patient was previously independent in community, and she will need skilled speech therapy to return to baseline function.     SLP Assessment  Patient will need skilled Speech Lanaguage Pathology Services during CIR admission    Recommendations  Patient may use Passy-Muir Speech Valve: with SLP only PMSV Supervision: Full MD: Please consider changing trach tube to : Smaller size Recommended Consults: FEES Diet Recommendations: NPO;Alternative means - temporary Medication Administration: Via alternative means Oral Care Recommendations: Oral care BID Patient destination: Home    SLP Frequency 5 out of 7 days   SLP Treatment/Interventions Dysphagia/aspiration precaution training;Patient/family education;Therapeutic Exercise;Speech/Language facilitation    Pain Pain Assessment Pain Assessment: No/denies pain Prior Functioning    Short Term Goals: Week 1: SLP Short Term Goal 1 (Week 1): Patient will perform pharyngeal strengthening exercises with min A verbal cues, in order to decrease aspiration risk.  SLP Short Term Goal 1 - Progress (Week 1):  (New goal) SLP Short Term Goal 2 (Week 1): Patient will be able to produce voice with PMV with supervision and cues, in order to verbally express wants/needs to family/staff.  SLP Short Term Goal 2 - Progress (Week 1):  (New goal)  See FIM for current functional status Refer to Care Plan for Long  Term Goals  Recommendations for other services: None  Discharge Criteria: Patient will be discharged from SLP if patient refuses treatment 3 consecutive times without medical reason, if treatment goals not met, if there is a change in medical status, if patient makes no progress towards goals or if patient is discharged from hospital.  The above assessment, treatment plan, treatment alternatives and goals were discussed and mutually agreed upon: by patient and family member.  Lenny Pastel 01/18/2013, 6:55 PM

## 2013-01-19 ENCOUNTER — Inpatient Hospital Stay (HOSPITAL_COMMUNITY): Payer: BC Managed Care – PPO | Admitting: *Deleted

## 2013-01-19 LAB — BASIC METABOLIC PANEL
CO2: 29 mEq/L (ref 19–32)
Calcium: 8.7 mg/dL (ref 8.4–10.5)
Creatinine, Ser: 0.5 mg/dL (ref 0.50–1.10)
Glucose, Bld: 124 mg/dL — ABNORMAL HIGH (ref 70–99)

## 2013-01-19 LAB — GLUCOSE, CAPILLARY
Glucose-Capillary: 117 mg/dL — ABNORMAL HIGH (ref 70–99)
Glucose-Capillary: 112 mg/dL — ABNORMAL HIGH (ref 70–99)
Glucose-Capillary: 115 mg/dL — ABNORMAL HIGH (ref 70–99)

## 2013-01-19 NOTE — Progress Notes (Signed)
Patient ID: Renee Nicholson, female   DOB: 04-03-1966, 47 y.o.   MRN: [id number]  Patient ID: Renee Nicholson, female   DOB: Jun 07, 1966, 47 y.o.   MRN: [id number]  66/83.  47 year old patient with a history of rectal cancer and radiation therapy, status post total colectomy with diverging loop ileostomy in March of 2013. She has had recurrent small bowel obstructions and was admitted on April 8 with recurrent small bowel obstruction. Hospital course complicated by acute respiratory failure requiring tracheostomy and the patient has been followed by critical care medicine. The patient is receiving enteral nutrition with additional free water but continues to have high output from her ileostomy.  Bmet normal today.   CBG (last 3)   Recent Labs  01/19/13 0004 01/19/13 0456 01/19/13 0750  GLUCAP 101* 117* 114*    General exam- alert frail status post tracheostomy HEENT- nasogastric tube in place; trach  Chest clear Cardiovascular no tachycardia Abdomen nondistended; status post ileostomy Extremities thin no swelling with significant muscle wasting  Impression-severe deconditioning status post small bowel obstruction with exploratory laparotomy and lysis of adhesions Postop respiratory failure High output ileostomy (normal Bmet today)      Author: Ranelle Oyster, MD Service: Physical Medicine and Rehabilitation Author Type: Physician    Filed: 01/17/2013  4:16 PM Note Time: 01/17/2013  9:57 AM           Physical Medicine and Rehabilitation Admission H&P      Chief Complaint   Patient presents with   .  Abdominal Pain    : HPI: Renee Nicholson is a 47 y.o. 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 Admitted 12/31/2012 with minimal output from ostomy as well as abdominal and back pain with nausea vomiting. X-rays and imaging  consistent with small bowel obstruction. Underwent exploratory laparotomy, lysis of adhesions and ileostomy revision 01/03/2013 for recurrent small bowel obstruction per Dr. Donell Beers. Hospital course complicated by tachycardia and hypotension followed by critical care medicine. Patient required intubation  per critical care medicine and self extubated 01/07/2013. Bouts of confusion altered mental status cranial CT scan 01/08/2013 showed no acute changes. Patient with progressive respiratory decline and ultimately required tracheostomy 01/09/2013 and currently has a #6 tracheostomy and plan followup to evaluate to downsize on 01/20/2013. Speech therapy working with Passy-Muir valve. Subcutaneous Lovenox added for DVT prophylaxis. Patient is currently n.p.o. and remains with nasogastric tube feeds for nutritional support. Physical and occupational therapy evaluations completed 01/11/2013 with recommendations of physical medicine rehabilitation consult. Patient was felt to be a good candidate for inpatient rehabilitation services and was admitted for comprehensive rehabilitation program   Review of Systems   Gastrointestinal: Positive for nausea, vomiting and abdominal pain.   Reflux   Psychiatric/Behavioral:   Anxiety   All other systems reviewed and are negative      Past Medical History   Diagnosis  Date   .  Anemia     .  Diarrhea     .  Anxiety     .  Bright red rectal bleeding  09/13/2011   .  History of chemotherapy         completed 09/2011    .  Hx of radiation therapy  08/29/11 to 10/09/11       rectum   .  GERD (gastroesophageal reflux disease)     .  Ileostomy in place     .  Cancer     .  Rectal cancer         Past Surgical History   Procedure  Laterality  Date   .  Tubal ligation       .  Carpal tunnel release       .  Colonoscopy    07/26/2011       Procedure: COLONOSCOPY;  Surgeon: Arlyce Harman, MD;  Location: [facility];  Service: Endoscopy;  Laterality: N/A;  10:40    .  Esophagogastroduodenoscopy    07/26/2011       Procedure: ESOPHAGOGASTRODUODENOSCOPY (EGD);  Surgeon: Arlyce Harman, MD;  Location: [facility];  Service: Endoscopy;  Laterality: N/A;   .  Esophagogastroduodenoscopy    12/27/2011       Procedure: ESOPHAGOGASTRODUODENOSCOPY (EGD);  Surgeon: Shirley Friar, MD;  Location: [facility];  Service: Endoscopy;  Laterality: N/A;   .  Laparoscopic colon resection    12/14/11       diverting ileostomy   .  Portacath placement    02/21/2012       Procedure: INSERTION PORT-A-CATH;  Surgeon: [name], MD;  Location: [facility];  Service: General;  Laterality: N/A;   .  Evaluation under anesthesia with anal fissurotomy  N/A  11/06/2012       Procedure: Exam Under Anesthesia , Anal Dilation;  Surgeon: [name], MD;  Location: [facility];  Service: General;  Laterality: N/A;  Exam Under Anesthesia , Anal Dilation   .  Laparotomy  N/A  01/03/2013       Procedure: EXPLORATORY LAPAROTOMY WITH LYSIS OF ADHESIONS, revision of ileostomy;  Surgeon: [name], MD;  Location: [facility];  Service: General;  Laterality: N/A;       Family History   Problem  Relation  Age of Onset   .  Diabetes  Mother     .  Colon cancer  Neg Hx     .  Cancer  Brother  35       rectal cancer lives in [place]      Social History:  reports that she has never smoked. She has never used smokeless tobacco. She reports that she does not drink alcohol or use illicit drugs. Allergies: No Known Allergies Medications Prior to Admission   Medication  Sig  Dispense  Refill   .  B Complex-C (B-COMPLEX WITH VITAMIN C) tablet  Take 1 tablet by mouth daily.         .  ferrous sulfate 325 (65 FE) MG tablet  Take 325 mg by mouth daily with breakfast.         .  lactose free nutrition (BOOST) LIQD  Take 237 mLs by mouth 2 (two) times daily.         .  Multiple Vitamin (MULTIVITAMIN) tablet  Take 1 tablet by mouth every morning.          .  pantoprazole (PROTONIX) 40 MG tablet  Take 40 mg by  mouth every morning.          .  potassium chloride SA (K-DUR,KLOR-CON) 20 MEQ tablet  Take 20 mEq by mouth every morning.         .  Simethicone 125 MG CAPS  Take 1 capsule by mouth 4 (four) times daily as needed (for gas or upset stomach).               Home: Home Living Lives With: Spouse Available Help at Discharge: Family;Available  24 hours/day Type of Home: House Home Access: Stairs to enter [employer] of Steps: 4 Entrance Stairs-Rails: Can reach both;Right;Left Home Layout: One level Home Adaptive Equipment: Walker - rolling;Straight cane Additional Comments: equipment used by parents in the home    Functional History:     Functional Status:   Mobility: Bed Mobility Bed Mobility: Supine to Sit Rolling Right: With rail;4: Min assist Rolling Right: Patient Percentage: 30% Right Sidelying to Sit: 4: Min assist;With rails Right Sidelying to Sit: Patient Percentage: 50% Supine to Sit: 5: Supervision;HOB elevated;With rails Sitting - Scoot to Edge of Bed: 5: Supervision Sit to Supine: 5: Supervision;HOB elevated Scooting to HOB: 1: +2 Total assist (with draw pad) Transfers Transfers: Sit to Stand;Stand to Sit Sit to Stand: 4: Min assist;With upper extremity assist;From bed Sit to Stand: Patient Percentage: 40% Stand to Sit: 4: Min assist;With upper extremity assist;To bed Squat Pivot Transfers: 1: +2 Total assist Squat Pivot Transfers: Patient Percentage: 30% Ambulation/Gait Ambulation/Gait Assistance: 4: Min assist Ambulation/Gait: Patient Percentage: 50% Ambulation Distance (Feet): 300 Feet Assistive device: Rolling walker Ambulation/Gait Assistance Details: Verbal cues for hand placement on rolling walker.  Pt with several losses of balance posteriorly. Gait Pattern: Step-through pattern;Decreased stride length;Decreased trunk rotation Gait velocity: decr Stairs: No Wheelchair Mobility Wheelchair Mobility: No   ADL: ADL Toilet Transfer: +2  Total assistance Transfers/Ambulation Related to ADLs: pt shifting weight and attempting to progress ambulation this session without initiation from therapist ADL Comments: Pt requried total (A) for ostomy bag and foley prior to starting. Sister in law present translating.    Cognition: Cognition Overall Cognitive Status: Difficult to assess Arousal/Alertness: Awake/alert Orientation Level: Oriented to person;Oriented to place;Oriented to time;Oriented to situation Cognition Arousal/Alertness: Awake/alert Behavior During Therapy: WFL for tasks assessed/performed Overall Cognitive Status: Difficult to assess Difficult to assess due to: Tracheostomy;Non-English speaking   Physical Exam: Blood pressure 120/60, pulse 75, temperature 97.8 F (36.6 C), temperature source Oral, resp. rate 16, height 4\' 4"  (1.321 m), weight 35.925 kg (79 lb 3.2 oz), last menstrual period 02/20/2012, SpO2 100.00%. Physical Exam  Vitals reviewed.   Constitutional:  48 y /o female with NG tube in place, no distress, frail appearing/petite Eyes: EOM are normal.   Neck:  Trach tube in place , #6, minimal secretions.   Cardiovascular: Normal rate and regular rhythm.   Pulmonary/Chest: Effort normal and breath sounds normal. No respiratory distress. She has no wheezes. No cough. Abdominal: Soft. Bowel sounds are normal. Appropriately tender Neurological: She is alert. She has normal reflexes. No cranial nerve deficit. She exhibits normal muscle tone.  Makes good eye contact with examiner.generally pleasant and alert. Follows all simple commands. Strength 3+ to 4/5 UE's. LE's 2- to 2/5 at HF prox to 3+/4 distally. No sensory deficits. DTR's 1+ Skin:  Surgical site dressed /colostomy in place with moderate gas. Close proximity to healing abdominal wounds which are granulating in. Psychiatric:  Quiet, flat       Results for orders placed during the hospital encounter of 12/31/12 (from the past 48 hour(s))    GLUCOSE, CAPILLARY     Status: Abnormal     Collection Time      01/15/13 12:09 PM       Result  Value  Range     Glucose-Capillary  116 (*)  70 - 99 mg/dL     Comment 1  Documented in Chart        Comment 2  Notify RN      GLUCOSE,  CAPILLARY     Status: Abnormal     Collection Time      01/15/13  5:39 PM       Result  Value  Range     Glucose-Capillary  124 (*)  70 - 99 mg/dL   GLUCOSE, CAPILLARY     Status: Abnormal     Collection Time      01/15/13  8:20 PM       Result  Value  Range     Glucose-Capillary  142 (*)  70 - 99 mg/dL     Comment 1  Notify RN        Comment 2  Documented in Chart      GLUCOSE, CAPILLARY     Status: None     Collection Time      01/15/13 11:20 PM       Result  Value  Range     Glucose-Capillary  96   70 - 99 mg/dL     Comment 1  Notify RN        Comment 2  Documented in Chart      GLUCOSE, CAPILLARY     Status: Abnormal     Collection Time      01/16/13  3:44 AM       Result  Value  Range     Glucose-Capillary  119 (*)  70 - 99 mg/dL   BASIC METABOLIC PANEL     Status: Abnormal     Collection Time      01/16/13  4:35 AM       Result  Value  Range     Sodium  133 (*)  135 - 145 mEq/L     Potassium  3.3 (*)  3.5 - 5.1 mEq/L     Chloride  96   96 - 112 mEq/L     CO2  32   19 - 32 mEq/L     Glucose, Bld  142 (*)  70 - 99 mg/dL     BUN  8   6 - 23 mg/dL     Creatinine, Ser  1.61   0.50 - 1.10 mg/dL     Calcium  8.2 (*)  8.4 - 10.5 mg/dL     GFR calc non Af Amer  >90   >90 mL/min     GFR calc Af Amer  >90   >90 mL/min     Comment:                The eGFR has been calculated        using the CKD EPI equation.        This calculation has not been        validated in all clinical        situations.        eGFR's persistently        <90 mL/min signify        possible Chronic Kidney Disease.   CBC     Status: Abnormal     Collection Time      01/16/13  4:35 AM       Result  Value  Range     WBC  9.1   4.0 - 10.5 K/uL     RBC  3.37 (*)   3.87 - 5.11 MIL/uL     Hemoglobin  10.1 (*)  12.0 - 15.0 g/dL     HCT  09.6 (*)  04.5 - 46.0 %  MCV  84.0   78.0 - 100.0 fL     MCH  30.0   26.0 - 34.0 pg     MCHC  35.7   30.0 - 36.0 g/dL     RDW  74.2 (*)  59.5 - 15.5 %     Platelets  275   150 - 400 K/uL   GLUCOSE, CAPILLARY     Status: Abnormal     Collection Time      01/16/13  7:44 AM       Result  Value  Range     Glucose-Capillary  134 (*)  70 - 99 mg/dL     Comment 1  Documented in Chart        Comment 2  Notify RN      GLUCOSE, CAPILLARY     Status: Abnormal     Collection Time      01/16/13 11:25 AM       Result  Value  Range     Glucose-Capillary  102 (*)  70 - 99 mg/dL     Comment 1  Documented in Chart        Comment 2  Notify RN      GLUCOSE, CAPILLARY     Status: Abnormal     Collection Time      01/16/13  4:12 PM       Result  Value  Range     Glucose-Capillary  127 (*)  70 - 99 mg/dL     Comment 1  Documented in Chart        Comment 2  Notify RN      GLUCOSE, CAPILLARY     Status: None     Collection Time      01/16/13  7:22 PM       Result  Value  Range     Glucose-Capillary  98   70 - 99 mg/dL   GLUCOSE, CAPILLARY     Status: Abnormal     Collection Time      01/16/13 11:58 PM       Result  Value  Range     Glucose-Capillary  131 (*)  70 - 99 mg/dL   GLUCOSE, CAPILLARY     Status: Abnormal     Collection Time      01/17/13  3:33 AM       Result  Value  Range     Glucose-Capillary  120 (*)  70 - 99 mg/dL     Comment 1  Notify RN        Comment 2  Documented in Chart      GLUCOSE, CAPILLARY     Status: Abnormal     Collection Time      01/17/13  8:51 AM       Result  Value  Range     Glucose-Capillary  130 (*)  70 - 99 mg/dL      No results found.   Post Admission Physician Evaluation: Functional deficits secondary  to deconditioning after small bowel obstruction requiring ex-lap with lysis of adhesions and ileostomy revision. Pt had post-op respiratory failure as well.   Patient is  admitted to receive collaborative, interdisciplinary care between the physiatrist, rehab nursing staff, and therapy team. Patient's level of medical complexity and substantial therapy needs in context of that medical necessity cannot be provided at a lesser intensity of care such as a SNF. Patient has experienced substantial functional loss from his/her baseline which  was documented above under the "Functional History" and "Functional Status" headings.  Judging by the patient's diagnosis, physical exam, and functional history, the patient has potential for functional progress which will result in measurable gains while on inpatient rehab.  These gains will be of substantial and practical use upon discharge  in facilitating mobility and self-care at the household level. Physiatrist will provide 24 hour management of medical needs as well as oversight of the therapy plan/treatment and provide guidance as appropriate regarding the interaction of the two. 24 hour rehab nursing will assist with bladder management, bowel management, safety, skin/wound care, disease management, medication administration, pain management and patient education  and help integrate therapy concepts, techniques,education, etc. PT will assess and treat for/with: Lower extremity strength, range of motion, stamina, balance, functional mobility, safety, adaptive techniques and equipment, pain mgt, family ed.   Goals are: supervision to mod I. OT will assess and treat for/with: ADL's, functional mobility, safety, upper extremity strength, adaptive techniques and equipment, pain mgt, family ed.   Goals are: supervision to mod I (perhaps additional help with ostomy care). SLP will assess and treat for/with: speech, swallow, communication.  Goals are: mod I. Case Management and Social Worker will assess and treat for psychological issues and discharge planning. Team conference will be held weekly to assess progress toward goals and to  determine barriers to discharge. Patient will receive at least 3 hours of therapy per day at least 5 days per week. ELOS: 10-12 days      Prognosis:  excellent, still with very poor stamina     Medical Problem List and Plan: 1. Deconditioning after small bowel obstruction. Status post exploratory lap, lysis of adhesions ileostomy revision-postop respiratory failure 2. DVT Prophylaxis/Anticoagulation: Subcutaneous Lovenox. Monitor platelet counts any signs of bleeding 3. Mood: Ativan 0.5 mg each bedtime. Provide emotional support. Family appears quite positive and supportive as well.   4. Neuropsych: This patient is capable of making decisions on his/her own behalf. 5. Dysphagia. Currently n.p.o. Continue nasogastric tube feeds and follow per speech therapy. 6. Respiratory failure. 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 Critical care medicine to followup in regards to downsizing trach tube. Speech therapy working with Passy-Muir valve             -would like to potentially downsize the trach to a #4 on monday 7. History of rectal cancer. No plan for ongoing radiation therapy at this time 8. Ileostomy: immodium, simethicone, formula adjustment             -probiotic also             -bag being emptied q2 hours to help protect abdominal wounds.   Ranelle Oyster, MD, [place] [facility]     01/17/2013

## 2013-01-19 NOTE — Progress Notes (Signed)
Physical Therapy Session Note  Patient Details  Name: Renee Nicholson MRN: [id number] Date of Birth: 1966/08/15  Today's Date: 01/19/2013 Time: 0800-0845 Time Calculation (min): 45 min  Short Term Goals: Week One goals not set by evaluating therapist  Skilled Therapeutic Interventions/Progress Updates:    Patient received supine in bed with sister-in-law present. Patient is nonverbal secondary to trach, however, can follow commands and sister-in-law assists with expressing patient's wants/needs, etc. Sister-in-law reports patient did not sleep very much last night secondary to ostomy care. Patient's sister-in-law declines patient going to gym and states she would like to walk around the unit. Today's session focused on standing balance and gait training (on various surfaces, in controlled environment, and within room). See details below for gait training.  Patient stood at sink x4' to perform mouth hygiene with SBA, reaching for objects and does not experience any overt LOB. Patient wearing gown, which is too big for her, given her height and weight. Request made to secretary for smaller gown. At end of session, smaller gown (from Peds unit) arrives, but patient does not want to try it on at this time. Sister-in-law reports she needs to rest and will try it on later. Discussion with secretary to request more gowns of this size secondary to safety concerns for patient ambulating or exercising with gown that is too big.  Patient does not report any SOB or pain during gait training, but does report fatigue. Patient requires one sitting rest break between bouts of gait training. Patient returned to room and left seated in recliner with all needs within reach and RN and sister-in-law present.  Therapy Documentation Precautions:  Precautions Precautions: Fall Precaution Comments: right colostomy, trach Restrictions Weight Bearing Restrictions: No Vital Signs: Therapy Vitals Patient Position, if  appropriate: Sitting Oxygen Therapy SpO2: 100 % O2 Device: Trach collar O2 Flow Rate (L/min): 5 L/min Pulse Oximetry Type: Intermittent Pain: Pain Assessment Pain Assessment: No/denies pain Pain Score: 0-No pain Mobility: Bed Mobility Bed Mobility: Supine to Sit Supine to Sit: 5: Supervision;HOB elevated;With rails Supine to Sit Details: Verbal cues for precautions/safety Transfers Sit to Stand: 4: Min guard;From chair/3-in-1;From bed;With armrests;With upper extremity assist Sit to Stand Details: Verbal cues for precautions/safety;Verbal cues for technique;Verbal cues for safe use of DME/AE Stand to Sit: 4: Min guard;To chair/3-in-1;To bed;With upper extremity assist Locomotion : Ambulation Ambulation: Yes Ambulation/Gait Assistance: 4: Min guard Ambulation Distance (Feet): 155 Feet Assistive device: Rolling walker Ambulation/Gait Assistance Details: Verbal cues for gait pattern;Verbal cues for safe use of DME/AE;Verbal cues for precautions/safety Ambulation/Gait Assistance Details: Patient instructed in gait training 155' x1 in controlled environment on hard, level surface and carpeted surface. Patient instructed in gait training 250'x1 in controlled environment and within room. Patient required to turn, negotiate obstacles, and ambulate in confined spaces. Patient demonstrates slight impulsivity with quick pacing when turning, side stepping, or negotiating obstacles, Patient also demonstrates unsafe RW management (ambulating outside BOS when turning or side stepping, unsafely negotiating threshold or changes in surface from tile to carpet and back). Gait Gait: Yes Gait Pattern: Decreased stride length;Decreased trunk rotation;Trunk flexed;Narrow base of support   See FIM for current functional status  Therapy/Group: Individual Therapy  [name]. Wilton Thrall, PT, DPT  01/19/2013, 8:51 AM

## 2013-01-20 ENCOUNTER — Inpatient Hospital Stay (HOSPITAL_COMMUNITY): Payer: BC Managed Care – PPO | Admitting: Physical Therapy

## 2013-01-20 ENCOUNTER — Inpatient Hospital Stay (HOSPITAL_COMMUNITY): Payer: BC Managed Care – PPO

## 2013-01-20 DIAGNOSIS — R5381 Other malaise: Secondary | ICD-10-CM

## 2013-01-20 DIAGNOSIS — J9600 Acute respiratory failure, unspecified whether with hypoxia or hypercapnia: Secondary | ICD-10-CM

## 2013-01-20 DIAGNOSIS — K56609 Unspecified intestinal obstruction, unspecified as to partial versus complete obstruction: Secondary | ICD-10-CM

## 2013-01-20 LAB — CBC WITH DIFFERENTIAL/PLATELET
Eosinophils Absolute: 0.2 10*3/uL (ref 0.0–0.7)
HCT: 27.6 % — ABNORMAL LOW (ref 36.0–46.0)
Hemoglobin: 9.5 g/dL — ABNORMAL LOW (ref 12.0–15.0)
Lymphs Abs: 0.9 10*3/uL (ref 0.7–4.0)
MCH: 29.5 pg (ref 26.0–34.0)
Monocytes Absolute: 0.9 10*3/uL (ref 0.1–1.0)
Monocytes Relative: 11 % (ref 3–12)
Neutrophils Relative %: 76 % (ref 43–77)
RBC: 3.22 MIL/uL — ABNORMAL LOW (ref 3.87–5.11)

## 2013-01-20 LAB — COMPREHENSIVE METABOLIC PANEL
Alkaline Phosphatase: 115 U/L (ref 39–117)
BUN: 10 mg/dL (ref 6–23)
Chloride: 99 mEq/L (ref 96–112)
Creatinine, Ser: 0.48 mg/dL — ABNORMAL LOW (ref 0.50–1.10)
GFR calc Af Amer: >90 mL/min (ref >90)
Glucose, Bld: 124 mg/dL — ABNORMAL HIGH (ref 70–99)
Potassium: 3.9 mEq/L (ref 3.5–5.1)
Total Bilirubin: 0.7 mg/dL (ref 0.3–1.2)
Total Protein: 6.1 g/dL (ref 6.0–8.3)

## 2013-01-20 LAB — GLUCOSE, CAPILLARY
Glucose-Capillary: 124 mg/dL — ABNORMAL HIGH (ref 70–99)
Glucose-Capillary: 111 mg/dL — ABNORMAL HIGH (ref 70–99)

## 2013-01-20 MED ORDER — LOPERAMIDE HCL 1 MG/5ML PO LIQD
2.0000 mg | Freq: Four times a day (QID) | ORAL | Status: DC
  Administered 2013-01-20 – 2013-01-27 (×26): 2 mg via ORAL
  Filled 2013-01-20 (×33): qty 10

## 2013-01-20 MED ORDER — VITAL 1.5 CAL PO LIQD
1000.0000 mL | ORAL | Status: DC
  Administered 2013-01-20: 1000 mL
  Filled 2013-01-20 (×3): qty 1000

## 2013-01-20 MED ORDER — SACCHAROMYCES BOULARDII 250 MG PO CAPS
250.0000 mg | ORAL_CAPSULE | Freq: Two times a day (BID) | ORAL | Status: DC
  Administered 2013-01-20 – 2013-01-27 (×15): 250 mg via ORAL
  Filled 2013-01-20 (×19): qty 1

## 2013-01-20 NOTE — Progress Notes (Signed)
13:20 Downsized Trach to #4 Shiley Uncuffed per MD order.  ETCO2 had good color change.  BBS equal, HR 99, SPO2 100%.  Pt tolerated well, RT to monitor

## 2013-01-20 NOTE — Progress Notes (Signed)
Physical Therapy Session Note  Patient Details  Name: Renee Nicholson MRN: [id number] Date of Birth: [birth year]  Today's Date: 01/20/2013 Time: Session #1:  4098-1191, Session #2: [id number] Time Calculation (min): Session #1: 30 min, Session #2: 45 min  Short Term Goals: Week 1:  PT Short Term Goal 1 (Week 1): Bed mobility supervision bed flat no rails.   PT Short Term Goal 2 (Week 1): Transfers bed<-> WC supervision with LRAD.   PT Short Term Goal 3 (Week 1): Gait with RW supervision >150' PT Short Term Goal 4 (Week 1): Berg Balance>45/56 to demonstrate that pt may not need assistive device for balance at discharge.   PT Short Term Goal 5 (Week 1): Ambulate without assistive device with min assist >150' before needing a rest break.    Skilled Therapeutic Interventions/Progress Updates:    Session #1: This session focused on gait with RW>500' with min guard assist (several small LOB while turning or looking left/right).  Gait was over carpet and tiled surfaces.  Seated exercises: ankle pumps, LAQ, hip adduct against pillow, seated marches all x 10 each bil.  Standing exercises: mini squats, standing marches, hip abduction, hip ext, knee flexion x 10 each bil.  TC 28% on 5-6L O2 sats 99-100%.  HR 106 bpm during activity.   Session #2: This session focused on gait with RW min guard assist for small LOB during turning and while distracted >150' x 2.  Gait without RW 75' x 2 with min assist at trunk for balance.  Decreased gait speed and increased WOB with no assistive device.  Stairs with bil rails reciprocal pattern min assist 4-6" steps x 5 steps x 2.  Gait exercises in hallway holding to hallway railing: side stepping marches, retro gait 35'x 1 each with min guard assist for balance.  Nu Step level 1 bil upper and lower extremities x 5 mins, lower extremities only x 3 mins.    Therapy Documentation Precautions:  Precautions Precautions: Fall Precaution Comments: right colostomy,  trach Restrictions Weight Bearing Restrictions: No General:   Vital Signs: Therapy Vitals Temp: 98.1 F (36.7 C) Temp src: Oral Pulse Rate: 92 Resp: 18 BP: 126/85 mmHg Patient Position, if appropriate: Lying Oxygen Therapy SpO2: 99 % O2 Device: None (Room air) O2 Flow Rate (L/min): 5 L/min FiO2 (%): 28 % Pain: Pain Assessment Pain Assessment: No/denies pain   Locomotion :     See FIM for current functional status  Therapy/Group: Individual Therapy  Lurena Joiner B. Rajat Staver, PT, DPT [phone]   01/20/2013, 4:31 PM

## 2013-01-20 NOTE — Progress Notes (Signed)
Speech Language Pathology Daily Session Note  Patient Details  Name: Renee Nicholson MRN: [id number] Date of Birth: 1965/11/14  Today's Date: 01/20/2013 Time: 1320-1350 Time Calculation (min): 30 min  Short Term Goals: Week 1: SLP Short Term Goal 1 (Week 1): Patient will perform pharyngeal strengthening exercises with min A verbal cues, in order to decrease aspiration risk.  SLP Short Term Goal 1 - Progress (Week 1):  (New goal) SLP Short Term Goal 2 (Week 1): Patient will be able to produce voice with PMV with supervision and cues, in order to verbally express wants/needs to family/staff.  SLP Short Term Goal 2 - Progress (Week 1):  (New goal)  Skilled Therapeutic Interventions: Treatment focus on speech goals. Pt missed first 15 minutes of session due to getting her tracheostomy downsized to a #4 cuffless.  Pt tolerated PMSV for 30 minutes with vital signs remaining stable (O2: 100% and HR: 100) and was able to redirect air to her upper airway for phonation. Pt required Min verbal cues to utilize diaphragmatic breathing for increased vocal intensity. Recommend pt utilize PMSV with full supervision. Pt and her sister educated to remove PMSV prior to sleeping. Plan to administer BSE tomorrow.    FIM:  Comprehension Comprehension Mode: Auditory Comprehension: 5-Understands basic 90% of the time/requires cueing < 10% of the time Expression Expression Mode: Verbal Expression: 4-Expresses basic 75 - 89% of the time/requires cueing 10 - 24% of the time. Needs helper to occlude trach/needs to repeat words. Social Interaction Social Interaction: 6-Interacts appropriately with others with medication or extra time (anti-anxiety, antidepressant). Problem Solving Problem Solving: 5-Solves basic 90% of the time/requires cueing < 10% of the time Memory Memory: 5-Recognizes or recalls 90% of the time/requires cueing < 10% of the time  Pain Pain Assessment Pain Assessment: No/denies  pain  Therapy/Group: Individual Therapy  Michelyn Scullin 01/20/2013, 3:45 PM

## 2013-01-20 NOTE — Progress Notes (Addendum)
INITIAL NUTRITION ASSESSMENT  DOCUMENTATION CODES Per approved criteria  -Severe malnutrition in the context of acute illness or injury   INTERVENTION: 1. Change formula to more concentrated elemental formula of Vital 1.5. Initiate formula at 25 ml/hr and advance by 10 ml/hr every 4 hours to goal of 45 ml/hr x 20 hours daily. Goal regimen will provide: 1350 kcal, 61 grams protein, 687 ml free water. Continue 200 ml free water QID. Total water provision will be 1488 ml free water (this provides 44 ml/kg.) 2. RD to continue to follow nutrition care plan  NUTRITION DIAGNOSIS: Inadequate oral intake related to inability to eat as evidenced by NPO status.   Goal: Intake to meet >90% of estimated nutrition needs.  Monitor:  weight trends, lab trends, I/O's, TF tolerance  Reason for Assessment: MD Consult + New TF  47 y.o. female  Admitting Dx: Physical deconditioning  ASSESSMENT: Admitted with SBO likely 2/2 post-op adhesions. Pt is is status post laparoscopic total abdominal colectomy, ileal pouch anal anastomosis, and diverting ileostomy. Pt has had to be admitted several times for small bowel obstruction. Pt with rectal CA T3 N1 s/p chemoradiation.  Pt followed by RD staff during acute hospitalization. Pt was followed by SLP, however remained NPO and required TPN and TF for nutrition support. Pt eventually transitioned to TF and recently was switched to elemental formula as pt was having increased output.  SLP saw pt over weekend and recommended FEES, while maintaining NPO status during the meantime.  RD consulted for ongoing increased ileostomy output. Noted pt with recent formula adjustment to promote tolerance and on probiotic at this time. Currently ordered for Vital 1.2 at 45 ml/hr. This provides: 1296 kcal, 81 grams protein, 876 ml free water. Receiving free water of 200 ml every 4 hours. This provides an additional 800 ml free water daily.  Pt meets criteria for severe  MALNUTRITION in the context of acute illness as evidenced by <50% estimated energy intake in the past week with 6.5% weight loss in the past 1.5 weeks.   Height: Ht Readings from Last 1 Encounters:  01/04/13 4\' 4"  (1.321 m)    Weight: Wt Readings from Last 1 Encounters:  01/20/13 76 lb 0.9 oz (34.5 kg)   Ideal Body Weight: 87 lb  % Ideal Body Weight: 87%  Wt Readings from Last 10 Encounters:  01/20/13 76 lb 0.9 oz (34.5 kg)  01/17/13 79 lb 3.2 oz (35.925 kg)  01/17/13 79 lb 3.2 oz (35.925 kg)  12/25/12 72 lb 3.2 oz (32.75 kg)  12/16/12 77 lb (34.927 kg)  12/12/12 76 lb 4.8 oz (34.609 kg)  11/22/12 76 lb 6 oz (34.643 kg)  11/16/12 76 lb 0.9 oz (34.5 kg)  11/14/12 78 lb 3.2 oz (35.471 kg)  10/30/12 79 lb (35.834 kg)    Usual Body Weight: 95 - 98 lb approx 2 years ago  % Usual Body Weight: 80%  BMI:  Body mass index is 19.77 kg/(m^2).  Estimated Nutritional Needs: Kcal: 1250 - 1500 kcal Protein: 55 - 75 grams Fluid: 1.3 - 1.5 liters daily  Skin: surgical abdominal incisions  Diet Order: NPO  EDUCATION NEEDS: -No education needs identified at this time   Intake/Output Summary (Last 24 hours) at 01/20/13 1034 Last data filed at 01/20/13 1010  Gross per 24 hour  Intake   3075 ml  Output   1855 ml  Net   1220 ml    Last BM: 4/27 - 1455 ml output yesterday  Labs:  Recent Labs Lab 01/16/13 0435 01/19/13 0705 01/20/13 0615  NA 133* 135 136  K 3.3* 4.0 3.9  CL 96 99 99  CO2 32 29 29  BUN 8 8 10   CREATININE 0.50 0.50 0.48*  CALCIUM 8.2* 8.7 8.8  GLUCOSE 142* 124* 124*    CBG (last 3)   Recent Labs  01/19/13 2359 01/20/13 0405 01/20/13 0742  GLUCAP 89 104* 110*    Scheduled Meds: . antiseptic oral rinse  15 mL Mouth Rinse q12n4p  . enoxaparin (LOVENOX) injection  30 mg Subcutaneous Q24H  . free water  200 mL Per Tube Q4H  . insulin aspart  0-15 Units Subcutaneous Q4H  . loperamide  2 mg Oral Q6H  . LORazepam  0.5 mg Per Tube QHS  .  multivitamin  5 mL Oral Daily  . pantoprazole sodium  40 mg Per Tube BID  . potassium chloride  20 mEq Oral Daily  . saccharomyces boulardii  250 mg Oral BID  . simethicone  40 mg Oral QID  . thiamine  100 mg Per Tube Daily    Continuous Infusions: . 0.9 % NaCl with KCl 40 mEq / L 50 mL/hr at 01/20/13 0127  . feeding supplement (VITAL AF 1.2 CAL) 1,000 mL (01/19/13 2233)    Past Medical History  Diagnosis Date  . Anemia   . Diarrhea   . Anxiety   . Bright red rectal bleeding 09/13/2011  . History of chemotherapy     completed 09/2011   . Hx of radiation therapy 08/29/11 to 10/09/11    rectum  . GERD (gastroesophageal reflux disease)   . Ileostomy in place   . Cancer   . Rectal cancer     Past Surgical History  Procedure Laterality Date  . Tubal ligation    . Carpal tunnel release    . Colonoscopy  07/26/2011    Procedure: COLONOSCOPY;  Surgeon: Arlyce Harman, MD;  Location: [facility];  Service: Endoscopy;  Laterality: N/A;  10:40  . Esophagogastroduodenoscopy  07/26/2011    Procedure: ESOPHAGOGASTRODUODENOSCOPY (EGD);  Surgeon: Arlyce Harman, MD;  Location: [facility];  Service: Endoscopy;  Laterality: N/A;  . Esophagogastroduodenoscopy  12/27/2011    Procedure: ESOPHAGOGASTRODUODENOSCOPY (EGD);  Surgeon: Shirley Friar, MD;  Location: [facility];  Service: Endoscopy;  Laterality: N/A;  . Laparoscopic colon resection  12/14/11    diverting ileostomy  . Portacath placement  02/21/2012    Procedure: INSERTION PORT-A-CATH;  Surgeon: [name], MD;  Location: [facility];  Service: General;  Laterality: N/A;  . Evaluation under anesthesia with anal fissurotomy N/A 11/06/2012    Procedure: Exam Under Anesthesia , Anal Dilation;  Surgeon: [name], MD;  Location: [facility];  Service: General;  Laterality: N/A;  Exam Under Anesthesia , Anal Dilation  . Laparotomy N/A 01/03/2013    Procedure: EXPLORATORY LAPAROTOMY WITH LYSIS OF ADHESIONS, revision of ileostomy;  Surgeon:  [name], MD;  Location: [facility];  Service: General;  Laterality: N/A;    Jarold Motto MS, RD, LDN Pager: [phone] After-hours pager: [phone]

## 2013-01-20 NOTE — Consult Note (Signed)
Ostomy follow-up: Current pouch intact with good seal.  Pt's sister at bedside states it was changed yesterday and staff is emptying frequently.  Supplies at bedside for staff use. Stoma red and viable and flush with skin level, mod liquid brown stool in pouch. Cammie Mcgee MSN, RN, CWOCN, [place], CNS [phone]

## 2013-01-20 NOTE — Progress Notes (Signed)
Occupational Therapy Session Note  Patient Details  Name: Renee Nicholson MRN: [id number] Date of Birth: 06-26-1966  Today's Date: 01/20/2013 Time: 3086-5784 Time Calculation (min): 55 min  Short Term Goals: Week 1:     Skilled Therapeutic Interventions/Progress Updates:    Pt resting in bed with sister-in-law present.  Pt agreeable to participating in therex in gym to increase overall endurance and strength.  Pt engaged in BUE therex with 2# and 3# weights bars - rowing, shoulder presses, bicep curls, triceps.  Pt O2 sats >93% on 4L O2 via trach.  Pt expressed fatigue with repetition of therex but willing to continue.  Therapy Documentation Precautions:  Precautions Precautions: Fall Precaution Comments: right colostomy, trach Restrictions Weight Bearing Restrictions: No Pain: Pain Assessment Pain Assessment: No/denies pain See FIM for current functional status  Therapy/Group: Individual Therapy  Rich Brave 01/20/2013, 9:33 AM

## 2013-01-20 NOTE — Progress Notes (Signed)
  Subjective: Still with high ostomy output, but less than before.    Objective: Vital signs in last 24 hours: Temp:  [97.4 F (36.3 C)-98 F (36.7 C)] 98 F (36.7 C) (04/28 0509) Pulse Rate:  [78-94] 83 (04/28 0739) Resp:  [18-20] 18 (04/28 0739) BP: (114-134)/(69-88) 134/88 mmHg (04/28 0509) SpO2:  [100 %] 100 % (04/28 0739) FiO2 (%):  [28 %] 28 % (04/28 0739) Weight:  [76 lb 0.9 oz (34.5 kg)] 76 lb 0.9 oz (34.5 kg) (04/28 0509) Last BM Date: 01/18/13 ( Ileostomy Emptied)  Intake/Output from previous day: 04/27 0701 - 04/28 0700 In: 3075 [I.V.:2115; NG/GT:960] Out: 1455 [Stool:1455] Intake/Output this shift: Total I/O In: -  Out: 400 [Stool:400]  General appearance: alert, cooperative and no distress Cardio: regular rate and rhythm GI: soft, non distended, approp tender.   Extremities: extremities normal, atraumatic, no cyanosis or edema  Lab Results:   Recent Labs  01/20/13 0615  WBC 8.0  HGB 9.5*  HCT 27.6*  PLT 362   BMET  Recent Labs  01/19/13 0705 01/20/13 0615  NA 135 136  K 4.0 3.9  CL 99 99  CO2 29 29  GLUCOSE 124* 124*  BUN 8 10  CREATININE 0.50 0.48*  CALCIUM 8.7 8.8   PT/INR No results found for this basename: LABPROT, INR,  in the last 72 hours ABG No results found for this basename: PHART, PCO2, PO2, HCO3,  in the last 72 hours  Studies/Results: Dg Abd Portable 1v  01/18/2013  *RADIOLOGY REPORT*  Clinical Data: NG tube placement.  PORTABLE ABDOMEN - 1 VIEW  Comparison: Scout image for CT scan dated 01/01/2013  Findings: Tip of the NG tube is at the gastroesophageal junction and the proximal side hole is in the distal esophagus.  The tube needs to be advanced.  The bowel gas pattern is normal.  Slight atelectasis at the right lung base.  IMPRESSION: NG tube tip is in the distal esophagus.   Original Report Authenticated By: Francene Boyers, M.D.     Anti-infectives: Anti-infectives   None      Assessment/Plan: s/p * No surgery  found * Tube feeds.  Since output came down, will leave with this formula.   Hopefully will get [name] downsized today and may do OK with speech. Continue IVF and imodium for now.  Will go down on IVF.     LOS: 3 days    [facility] 01/20/2013

## 2013-01-20 NOTE — Progress Notes (Signed)
Subjective/Complaints: Still continued high output from ilestomy. Area generally tender. No respiratory issues over the weeekend  Objective: Vital Signs: Blood pressure 134/88, pulse 83, temperature 98 F (36.7 C), temperature source Oral, resp. rate 18, weight 34.5 kg (76 lb 0.9 oz), last menstrual period 02/20/2012, SpO2 100.00%. Dg Abd Portable 1v  01/18/2013  *RADIOLOGY REPORT*  Clinical Data: NG tube placement.  PORTABLE ABDOMEN - 1 VIEW  Comparison: Scout image for CT scan dated 01/01/2013  Findings: Tip of the NG tube is at the gastroesophageal junction and the proximal side hole is in the distal esophagus.  The tube needs to be advanced.  The bowel gas pattern is normal.  Slight atelectasis at the right lung base.  IMPRESSION: NG tube tip is in the distal esophagus.   Original Report Authenticated By: Francene Boyers, M.D.     Recent Labs  01/20/13 0615  WBC 8.0  HGB 9.5*  HCT 27.6*  PLT 362    Recent Labs  01/19/13 0705 01/20/13 0615  NA 135 136  K 4.0 3.9  CL 99 99  GLUCOSE 124* 124*  BUN 8 10  CREATININE 0.50 0.48*  CALCIUM 8.7 8.8   CBG (last 3)   Recent Labs  01/19/13 2004 01/19/13 2359 01/20/13 0742  GLUCAP 133* 89 110*    Wt Readings from Last 3 Encounters:  01/20/13 34.5 kg (76 lb 0.9 oz)  01/17/13 35.925 kg (79 lb 3.2 oz)  01/17/13 35.925 kg (79 lb 3.2 oz)    Physical Exam:  Vitals reviewed.  Constitutional:   NG tube in place, no distress, frail appearing/petite  Eyes: EOM are normal.  Neck:  Trach tube in place , #6, minimal secretions. sats 100% Cardiovascular: Normal rate and regular rhythm.  Pulmonary/Chest: Effort normal and breath sounds normal. No respiratory distress. She has no wheezes. No cough.  Abdominal: Soft. Bowel sounds are normal. Appropriately tender Neurological: She is alert. She has normal reflexes. No cranial nerve deficit. She exhibits normal muscle tone.  Makes good eye contact with examiner.generally pleasant and  alert. Follows all simple commands. Strength 3+ to 4/5 UE's. LE's still 2- to 2/5 at HF prox to 3+/4 distally. No sensory deficits. DTR's 1+  Skin:  Surgical site dressed /colostomy in place with moderate gas. Close proximity to healing abdominal wounds which are granulating in. Psychiatric:  Quiet, flat    Assessment/Plan: 1. Functional deficits secondary to severe deconditioning after SBO, exlap, lysis of adhesions, ileostomy revision which require 3+ hours per day of interdisciplinary therapy in a comprehensive inpatient rehab setting. Physiatrist is providing close team supervision and 24 hour management of active medical problems listed below. Physiatrist and rehab team continue to assess barriers to discharge/monitor patient progress toward functional and medical goals. FIM: FIM - Bathing Bathing Steps Patient Completed: Chest;Right Arm;Left Arm;Abdomen;Front perineal area;Buttocks;Right upper leg;Left upper leg Bathing: 4: Min-Patient completes 8-9 44f 10 parts or 75+ percent  FIM - Upper Body Dressing/Undressing Upper body dressing/undressing: 0: Wears gown/pajamas-no public clothing FIM - Lower Body Dressing/Undressing Lower body dressing/undressing: 0: Wears gown/pajamas-no public clothing  FIM - Toileting Toileting steps completed by patient: Adjust clothing prior to toileting;Performs perineal hygiene;Adjust clothing after toileting Toileting: 5: Supervision: Safety issues/verbal cues  FIM - Diplomatic Services operational officer Devices: Bedside commode Toilet Transfers: 4-To toilet/BSC: Min A (steadying Pt. > 75%);4-From toilet/BSC: Min A (steadying Pt. > 75%)  FIM - Bed/Chair Transfer Bed/Chair Transfer Assistive Devices: HOB elevated;Bed rails;Walker Bed/Chair Transfer: 5: Supine > Sit: Supervision (verbal cues/safety  issues);5: Sit > Supine: Supervision (verbal cues/safety issues);4: Bed > Chair or W/C: Min A (steadying Pt. > 75%);4: Chair or W/C > Bed: Min A  (steadying Pt. > 75%)  FIM - Locomotion: Wheelchair Locomotion: Wheelchair: 0: Activity did not occur FIM - Locomotion: Ambulation Locomotion: Ambulation Assistive Devices: Designer, industrial/product Ambulation/Gait Assistance: 4: Min guard Locomotion: Ambulation: 4: Travels 150 ft or more with minimal assistance (Pt.>75%)  Comprehension Comprehension Mode: Auditory Comprehension: 4-Understands basic 75 - 89% of the time/requires cueing 10 - 24% of the time  Expression Expression Mode: Nonverbal Expression Assistive Devices: 6-Other (Comment) Expression: 6-Expresses complex ideas: With extra time/assistive device  Social Interaction Social Interaction: 7-Interacts appropriately with others - No medications needed.  Problem Solving Problem Solving Mode: Not assessed Problem Solving: 4-Solves basic 75 - 89% of the time/requires cueing 10 - 24% of the time  Memory Memory: 7-Complete Independence: No helper  Medical Problem List and Plan:  1. Deconditioning after small bowel obstruction. Status post exploratory lap, lysis of adhesions ileostomy revision-postop respiratory failure  2. DVT Prophylaxis/Anticoagulation: Subcutaneous Lovenox. Monitor platelet counts any signs of bleeding  3. Mood: Ativan 0.5 mg each bedtime. Provide emotional support. Family appears quite positive and supportive as well.  4. Neuropsych: This patient is capable of making decisions on his/her own behalf.  5. Dysphagia. Currently n.p.o. Continue nasogastric tube feeds and follow per speech therapy.  6. Respiratory failure. 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 --managing secretions well  -downsize to #4 trach 7. History of rectal cancer. No plan for ongoing radiation therapy at this time  8. Ileostomy: immodium, simethicone, formula adjustment  -probiotic  -bag being emptied q2 hours to help protect abdominal wounds. -RD follow up.  -would like to get on diet soon  LOS (Days) 3 A FACE TO FACE EVALUATION WAS  PERFORMED  Adreonna Yontz T 01/20/2013 8:06 AM

## 2013-01-20 NOTE — Progress Notes (Signed)
Occupational Therapy Note  Patient Details  Name: [name] MRN: [id number] Date of Birth: Oct 21, 1965 Today's Date: 01/20/2013  Time: 9:25am- 10:05am ( .) Missed OT time: . (due to nursing care) Pt seen for 1:1 OT session focusing on transfers, activity and standing tolerance and bil UE strengthening. Pt had just finished an hour long OT session before this session, appearing fatigued. Pt used RW with S from EOM to ergometer where she completed forwards and backwards at a work level of 1.0. O2 sats checked after each exercise and rest break. O2 sats in low 90's after exercise, with quick recovery up to 99% within 30-60 seconds. Heart rate ranged from 110-117 bpm during and after exercise. Pt stood at raised table to complete UE arc, moving rings from side to side, requiring rest break after 2x. Completed activity 4x total. Pt pointed to ostomy bag and sister in law stated it probably needed to be emptied. Pt taken back to room in wheelchair and nursing called. Tx ended with sister in law and nurse in room. No pain reported by pt during session, just fatigue.    Bahar Shelden M Cristino Degroff 01/20/2013, 10:20 AM

## 2013-01-20 NOTE — Progress Notes (Signed)
Patient information reviewed and entered into [facility] system by Esthela Brandner, RN, CRRN, PPS Coordinator.  Information including medical coding and functional independence measure will be reviewed and updated through discharge.    

## 2013-01-21 ENCOUNTER — Inpatient Hospital Stay (HOSPITAL_COMMUNITY): Payer: BC Managed Care – PPO | Admitting: Occupational Therapy

## 2013-01-21 ENCOUNTER — Inpatient Hospital Stay (HOSPITAL_COMMUNITY): Payer: BC Managed Care – PPO | Admitting: Speech Pathology

## 2013-01-21 ENCOUNTER — Inpatient Hospital Stay (HOSPITAL_COMMUNITY): Payer: BC Managed Care – PPO

## 2013-01-21 DIAGNOSIS — E46 Unspecified protein-calorie malnutrition: Secondary | ICD-10-CM

## 2013-01-21 DIAGNOSIS — J9600 Acute respiratory failure, unspecified whether with hypoxia or hypercapnia: Secondary | ICD-10-CM

## 2013-01-21 DIAGNOSIS — R5381 Other malaise: Secondary | ICD-10-CM

## 2013-01-21 DIAGNOSIS — K56609 Unspecified intestinal obstruction, unspecified as to partial versus complete obstruction: Secondary | ICD-10-CM

## 2013-01-21 LAB — GLUCOSE, CAPILLARY
Glucose-Capillary: 118 mg/dL — ABNORMAL HIGH (ref 70–99)
Glucose-Capillary: 127 mg/dL — ABNORMAL HIGH (ref 70–99)
Glucose-Capillary: 99 mg/dL (ref 70–99)

## 2013-01-21 MED ORDER — OPIUM 10 MG/ML (1%) PO TINC
4.0000 [drp] | Freq: Four times a day (QID) | ORAL | Status: DC
  Administered 2013-01-21 – 2013-01-26 (×22): 2 mg
  Filled 2013-01-21 (×23): qty 1

## 2013-01-21 MED ORDER — PROMOTE PO LIQD
1000.0000 mL | ORAL | Status: DC
  Administered 2013-01-21: 1000 mL
  Filled 2013-01-21: qty 1000

## 2013-01-21 MED ORDER — PROMOTE PO LIQD
1000.0000 mL | ORAL | Status: DC
  Filled 2013-01-21 (×2): qty 1000

## 2013-01-21 MED ORDER — FREE WATER
200.0000 mL | Freq: Three times a day (TID) | Status: DC
  Administered 2013-01-21 – 2013-01-22 (×2): 200 mL

## 2013-01-21 NOTE — Progress Notes (Signed)
Patient ID: Renee Nicholson, female   DOB: October 20, 1965, 47 y.o.   MRN: [id number]    Subjective: Ostomy output went up again.  Got trach downsized yesterday.    Objective: Vital signs in last 24 hours: Temp:  [98 F (36.7 C)-98.1 F (36.7 C)] 98 F (36.7 C) (04/29 0457) Pulse Rate:  [84-108] 108 (04/29 0827) Resp:  [16-20] 18 (04/29 0827) BP: (117-126)/(67-85) 117/67 mmHg (04/29 0457) SpO2:  [98 %-100 %] 98 % (04/29 0827) FiO2 (%):  [28 %] 28 % (04/29 0827) Last BM Date: 01/20/13  Intake/Output from previous day: 04/28 0701 - 04/29 0700 In: 200 [NG/GT:200] Out: 1930 [Emesis/NG output:40; Stool:1890] Intake/Output this shift:    General appearance: alert, cooperative and no distress Cardio: regular rate and rhythm GI: soft, non distended, approp tender.  Ostomy watery.   Extremities: extremities normal, atraumatic, no cyanosis or edema  Lab Results:   Recent Labs  01/20/13 0615  WBC 8.0  HGB 9.5*  HCT 27.6*  PLT 362   BMET  Recent Labs  01/19/13 0705 01/20/13 0615  NA 135 136  K 4.0 3.9  CL 99 99  CO2 29 29  GLUCOSE 124* 124*  BUN 8 10  CREATININE 0.50 0.48*  CALCIUM 8.7 8.8   PT/INR No results found for this basename: LABPROT, INR,  in the last 72 hours ABG No results found for this basename: PHART, PCO2, PO2, HCO3,  in the last 72 hours  Studies/Results: No results found.  Anti-infectives: Anti-infectives   None      Assessment/Plan: s/p * No surgery found * Tube feeds.  Output back up.  Will add DTO.  May pass speech and be able to eat normally.  Will ask nutrition for other recs.     Hopefully will get [name] downsized today and may do OK with speech. Continue IVF and imodium for now.      LOS: 4 days    [facility] 01/21/2013

## 2013-01-21 NOTE — Plan of Care (Signed)
Problem: RH PAIN MANAGEMENT Goal: RH STG PAIN MANAGED AT OR BELOW PT'S PAIN GOAL Less or equal to 3  Outcome: Progressing No c/o pain

## 2013-01-21 NOTE — Progress Notes (Signed)
Subjective/Complaints: Still continued high output from ilestomy. Tolerated downsize to #4 trach without any issues.   Objective: Vital Signs: Blood pressure 117/67, pulse 88, temperature 98 F (36.7 C), temperature source Oral, resp. rate 18, weight 34.5 kg (76 lb 0.9 oz), last menstrual period 02/20/2012, SpO2 99.00%. No results found.  Recent Labs  01/20/13 0615  WBC 8.0  HGB 9.5*  HCT 27.6*  PLT 362    Recent Labs  01/19/13 0705 01/20/13 0615  NA 135 136  K 4.0 3.9  CL 99 99  GLUCOSE 124* 124*  BUN 8 10  CREATININE 0.50 0.48*  CALCIUM 8.7 8.8   CBG (last 3)   Recent Labs  01/21/13 0018 01/21/13 0447 01/21/13 0759  GLUCAP 118* 114* 127*    Wt Readings from Last 3 Encounters:  01/20/13 34.5 kg (76 lb 0.9 oz)  01/17/13 35.925 kg (79 lb 3.2 oz)  01/17/13 35.925 kg (79 lb 3.2 oz)    Physical Exam:  Vitals reviewed.  Constitutional:   NG tube in place, no distress, frail appearing/petite  Eyes: EOM are normal.  Neck:  Trach tube in place , #4, minimal secretions. sats 100%. able to speak around trach Cardiovascular: Normal rate and regular rhythm.  Pulmonary/Chest: Effort normal and breath sounds normal. No respiratory distress. She has no wheezes. No cough. Occasional upper airways sounds. Abdominal: Soft. Bowel sounds are normal. Appropriately tender Neurological: She is alert. She has normal reflexes. No cranial nerve deficit. She exhibits normal muscle tone.  Makes good eye contact with examiner.generally pleasant and alert. Follows all simple commands. Strength remains 3+ to 4/5 UE's. LE's still 2- to 2/5 at HF prox to 3+/4 distally. No sensory deficits. DTR's 1+  Skin:  Surgical site dressed /colostomy in place with some gas. Close proximity to healing abdominal wound whic his granulating in. Psychiatric:  Quiet, flat    Assessment/Plan: 1. Functional deficits secondary to severe deconditioning after SBO, exlap, lysis of adhesions, ileostomy  revision which require 3+ hours per day of interdisciplinary therapy in a comprehensive inpatient rehab setting. Physiatrist is providing close team supervision and 24 hour management of active medical problems listed below. Physiatrist and rehab team continue to assess barriers to discharge/monitor patient progress toward functional and medical goals. FIM: FIM - Bathing Bathing Steps Patient Completed: Chest;Right Arm;Left Arm;Abdomen;Front perineal area;Buttocks;Right upper leg;Left upper leg Bathing: 4: Min-Patient completes 8-9 34f 10 parts or 75+ percent  FIM - Upper Body Dressing/Undressing Upper body dressing/undressing: 0: Wears gown/pajamas-no public clothing FIM - Lower Body Dressing/Undressing Lower body dressing/undressing: 0: Wears gown/pajamas-no public clothing  FIM - Toileting Toileting steps completed by patient: Adjust clothing prior to toileting;Performs perineal hygiene;Adjust clothing after toileting Toileting: 5: Supervision: Safety issues/verbal cues  FIM - Diplomatic Services operational officer Devices: Bedside commode Toilet Transfers: 4-To toilet/BSC: Min A (steadying Pt. > 75%);4-From toilet/BSC: Min A (steadying Pt. > 75%)  FIM - Bed/Chair Transfer Bed/Chair Transfer Assistive Devices: Bed rails;Walker Bed/Chair Transfer: 5: Supine > Sit: Supervision (verbal cues/safety issues);5: Bed > Chair or W/C: Supervision (verbal cues/safety issues);5: Chair or W/C > Bed: Supervision (verbal cues/safety issues)  FIM - Locomotion: Wheelchair Locomotion: Wheelchair: 0: Activity did not occur FIM - Locomotion: Ambulation Locomotion: Ambulation Assistive Devices: Designer, industrial/product Ambulation/Gait Assistance: 4: Min assist Locomotion: Ambulation: 4: Travels 150 ft or more with minimal assistance (Pt.>75%)  Comprehension Comprehension Mode: Auditory Comprehension: 5-Understands basic 90% of the time/requires cueing < 10% of the time  Expression Expression Mode:  Verbal Expression Assistive  Devices: 6-Other (Comment) Expression: 4-Expresses basic 75 - 89% of the time/requires cueing 10 - 24% of the time. Needs helper to occlude trach/needs to repeat words.  Social Interaction Social Interaction: 5-Interacts appropriately 90% of the time - Needs monitoring or encouragement for participation or interaction.  Problem Solving Problem Solving Mode: Not assessed Problem Solving: 5-Solves basic 90% of the time/requires cueing < 10% of the time  Memory Memory: 5-Recognizes or recalls 90% of the time/requires cueing < 10% of the time  Medical Problem List and Plan:  1. Deconditioning after small bowel obstruction. Status post exploratory lap, lysis of adhesions ileostomy revision-postop respiratory failure  2. DVT Prophylaxis/Anticoagulation: Subcutaneous Lovenox. Monitor platelet counts any signs of bleeding  3. Mood: Ativan 0.5 mg each bedtime. Provide emotional support. Family appears quite positive and supportive as well.  4. Neuropsych: This patient is capable of making decisions on his/her own behalf.  5. Dysphagia. Currently n.p.o. Continue nasogastric tube feeds and follow per speech therapy.  6. Respiratory failure. 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 --managing secretions well  -downsized to #4 trach -plug tonight. Use PMV during day. -dc trach tomorrow or Thursday if she does well. 7. History of rectal cancer. No plan for ongoing radiation therapy at this time  8. Ileostomy: immodium, simethicone, formula adjustment? -probiotic  -bag being emptied q2 hours to help protect abdominal wounds. -RD follow up.  -would like to get on PO diet soon  LOS (Days) 4 A FACE TO FACE EVALUATION WAS PERFORMED  Arleatha Philipps T 01/21/2013 8:20 AM

## 2013-01-21 NOTE — Progress Notes (Signed)
[facility] Individual Statement of Services  Patient Name:  Renee Nicholson  Date:  01/21/2013  Welcome to the [facility].  Our goal is to provide you with an individualized program based on your diagnosis and situation, designed to meet your specific needs.  With this comprehensive rehabilitation program, you will be expected to participate in at least 3 hours of rehabilitation therapies Monday-Friday, with modified therapy programming on the weekends.  Your rehabilitation program will include the following services:  Physical Therapy (PT), Occupational Therapy (OT), Speech Therapy (ST), 24 hour per day rehabilitation nursing, Therapeutic Recreaction (TR), Neuropsychology, Case Management ( Social Worker), Rehabilitation Medicine, Nutrition Services and Pharmacy Services  Weekly team conferences will be held on Tuesdays to discuss your progress.  Your Social Worker will talk with you frequently to get your input and to update you on team discussions.  Team conferences with you and your family in attendance may also be held.  Expected length of stay: 7-10 days  Overall anticipated outcome: modified independent  Depending on your progress and recovery, your program may change. Your Social Worker will coordinate services and will keep you informed of any changes. Your Social Worker's name and contact numbers are listed  below.  The following services may also be recommended but are not provided by the [facility]:   Driving Evaluations  Home Health Rehabiltiation Services  Outpatient Rehabilitatation [facility]  Vocational Rehabilitation   Arrangements will be made to provide these services after discharge if needed.  Arrangements include referral to agencies that provide these services.  Your insurance has been verified to be:  BCBS PPO Your primary doctor is:  Dr. Sherril Croon  Pertinent information will be shared with your doctor and  your insurance company.  Social Worker:  [place], [place] [phone] or ([phone]  Information discussed with and copy given to patient by: Amada Jupiter, 01/21/2013, 1:11 PM

## 2013-01-21 NOTE — Evaluation (Addendum)
Bedside Swallow Assessment and Plan  Patient Details  Name: Renee Nicholson MRN: [id number] Date of Birth: 05-06-1966  SLP Diagnosis: Dysphagia;Voice disorder  Rehab Potential: Good ELOS:  2 weeks  Today's Date: 01/21/2013 Time: 1530-1600 Time Calculation (min): 30 min  Problem List:  Patient Active Problem List   Diagnosis Date Noted  . Physical deconditioning 01/20/2013  . 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 Acute respiratory failure 01/02/2013  . Shock 01/01/2013  . SIRS (systemic inflammatory response syndrome) 01/01/2013  . SBO (small bowel obstruction) 11/17/2012  . Hypokalemia 11/17/2012  . Hx of radiation therapy   . 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 Thyroid mass 10/02/2011  . Rectal cancer s/p proctocolectomy, j-pouch with ileoanal anastomosis, loop ileostomy on 12/14/11 08/16/2011   Past Medical History:  Past Medical History  Diagnosis Date  . Anemia   . Diarrhea   . Anxiety   . Bright red rectal bleeding 09/13/2011  . History of chemotherapy     completed 09/2011   . Hx of radiation therapy 08/29/11 to 10/09/11    rectum  . GERD (gastroesophageal reflux disease)   . Ileostomy in place   . Cancer   . Rectal cancer    Past Surgical History:  Past Surgical History  Procedure Laterality Date  . Tubal ligation    . Carpal tunnel release    . Colonoscopy  07/26/2011    Procedure: COLONOSCOPY;  Surgeon: Arlyce Harman, MD;  [facility];  Service: Endoscopy;  Laterality: N/A;  10:40  . Esophagogastroduodenoscopy  07/26/2011    Procedure: ESOPHAGOGASTRODUODENOSCOPY (EGD);  Surgeon: Arlyce Harman, MD;  [facility];  Service: Endoscopy;  Laterality: N/A;  . Esophagogastroduodenoscopy  12/27/2011    Procedure: ESOPHAGOGASTRODUODENOSCOPY (EGD);  Surgeon: Shirley Friar, MD;  Location: [facility];  Service: Endoscopy;  Laterality: N/A;  . Laparoscopic colon resection  12/14/11    diverting ileostomy  . Portacath placement  02/21/2012   Procedure: INSERTION PORT-A-CATH;  Surgeon: [name], MD;  [facility];  Service: General;  Laterality: N/A;  . Evaluation under anesthesia with anal fissurotomy N/A 11/06/2012    Procedure: Exam Under Anesthesia , Anal Dilation;  Surgeon: [name], MD;  Location: [facility];  Service: General;  Laterality: N/A;  Exam Under Anesthesia , Anal Dilation  . 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  She was recently admitted December 31, 2012, with minimal ostomy output, abdominal and back pain, with nausea and vomiting.  Studies revealed small bowel obstruction.  She had surgical intervention that was complicated by tachycardia and hypotension.  The patient required intubation, and she self-extubated on January 07, 2013.  CT scan showed no acute changes.  On January 09, 2013, she had a #6 tracheostomy placed, and downsized January 20, 2013 to #4 cuffless.  Patient with Passy-Muir valve in placed yesterday with good toleration.  Bedside Swallow Evaluation completed and good hyo-laryngeal excursion with timely swallows.  Straw sips via swallow result in delayed weak cough response.  As a result, it is recommended that this patient initiate a Dys.2 texture and thin liquid diet with full supervision to  ensure small portions with a slow consumption rate.   Recommend SLP to follow closely for toleration.     SLP initiated treatment by educating patient and family regarding recommendations and safe swallow precautions.      SLP Assessment  Patient will need skilled Speech Lanaguage Pathology Services during CIR  admission    Recommendations  Patient may use Passy-Muir Speech Valve: During PO intake/meals;During all waking hours (remove during sleep) PMSV Supervision: Intermittent Diet Recommendations: Dysphagia 2 (Fine chop);Thin liquid Liquid Administration via: Cup;No straw Medication Administration: Via alternative means Supervision: Patient able to self feed;Full supervision/cueing for compensatory strategies;Trained caregiver to feed patient Compensations: Slow rate;Small sips/bites Postural Changes and/or Swallow Maneuvers: Out of bed for meals;Seated upright 90 degrees Oral Care Recommendations: Oral care BID Patient destination: Home Follow up Recommendations: Home Health SLP Equipment Recommended: None recommended by SLP    SLP Frequency 5 out of 7 days   SLP Treatment/Interventions Dysphagia/aspiration precaution training;Patient/family education;Therapeutic Exercise;Speech/Language facilitation    Pain  0 Prior Functioning  regular and thin   Short Term Goals: Week 1: SLP Short Term Goal 1 (Week 1): Patient will perform pharyngeal strengthening exercises with min A verbal cues, in order to decrease aspiration risk.  SLP Short Term Goal 1 - Progress (Week 1): Progressing toward goal SLP Short Term Goal 2 (Week 1): Patient will be able to produce voice with PMV with supervision and cues, in order to verbally express wants/needs to family/staff.  SLP Short Term Goal 2 - Progress (Week 1): Met SLP Short Term Goal 3 (Week 1): Patient will consume ys.2 textures and thin liquids with supervision level verbal cues to utilize safe swallow compensatory strategies. SLP Short Term Goal 3 - Progress (Week 1): Progressing toward goal  See FIM for current functional status Refer to Care Plan for Long Term Goals  Recommendations for other services: None  Discharge Criteria: Patient will be discharged from SLP if patient refuses treatment 3 consecutive times without medical reason, if  treatment goals not met, if there is a change in medical status, if patient makes no progress towards goals or if patient is discharged from hospital.  The above assessment, treatment plan, treatment alternatives and goals were discussed and mutually agreed upon: by patient  Fae Pippin, M.A., CCC-SLP [phone]  Renee Nicholson 01/21/2013, 4:55 PM

## 2013-01-21 NOTE — Progress Notes (Signed)
Physical Therapy Session Note  Patient Details  Name: Renee Nicholson MRN: [id number] Date of Birth: 1966-02-16  Today's Date: 01/21/2013 Time: 0930-1030 Time Calculation (min): 60 min  Short Term Goals: Week 1:  PT Short Term Goal 1 (Week 1): Bed mobility supervision bed flat no rails.   PT Short Term Goal 2 (Week 1): Transfers bed<-> WC supervision with LRAD.   PT Short Term Goal 3 (Week 1): Gait with RW supervision >150' PT Short Term Goal 4 (Week 1): Berg Balance>45/56 to demonstrate that pt may not need assistive device for balance at discharge.   PT Short Term Goal 5 (Week 1): Ambulate without assistive device with min assist >150' before needing a rest break.    Skilled Therapeutic Interventions/Progress Updates:   Session focused on dynamic gait and balance re-training without AD (pt ambulated with RW into the bathroom, but just carried the walker and left it off the side. Discussed focusing on gait more without AD or trial of cane for balance) including obstacle course, stepping over objects, stair training,retro gait, and agility ladder for directional changes. Neuro re-ed for balance reactions on compliant surface without UE support reaching outside BOS for horseshoe and placing overhead on basketball hoop. Pt overall very close S to min A when LOB occurred. Gait on unit for overall endurance and strengthening on carpeted and tiled surfaces with close S/steady A .  Therapy Documentation Precautions:  Precautions Precautions: Fall Precaution Comments: right colostomy, trach Restrictions Weight Bearing Restrictions: No  Vital Signs: Capped on room air - monitored sats with activity. Pt dropped to 80-85% first activity trial, but as soon as cued for deep breathing, increased and remained greater than 93%  Pain: No complaints.   Locomotion : Ambulation Ambulation/Gait Assistance: 4: Min assist   See FIM for current functional status  Therapy/Group: Individual Therapy  Karolee Stamps [facility] 01/21/2013, 10:54 AM

## 2013-01-21 NOTE — Progress Notes (Signed)
Social Work  Social Work Assessment and Plan  Patient Details  Name: Renee Nicholson MRN: [id number] Date of Birth: 03/16/1966  Today's Date: 01/21/2013  Problem List:  Patient Active Problem List   Diagnosis Date Noted  . Physical deconditioning 01/20/2013  . 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 Acute respiratory failure 01/02/2013  . Shock 01/01/2013  . SIRS (systemic inflammatory response syndrome) 01/01/2013  . SBO (small bowel obstruction) 11/17/2012  . Hypokalemia 11/17/2012  . Hx of radiation therapy   . 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 Thyroid mass 10/02/2011  . Rectal cancer s/p proctocolectomy, j-pouch with ileoanal anastomosis, loop ileostomy on 12/14/11 08/16/2011   Past Medical History:  Past Medical History  Diagnosis Date  . Anemia   . Diarrhea   . Anxiety   . Bright red rectal bleeding 09/13/2011  . History of chemotherapy     completed 09/2011   . Hx of radiation therapy 08/29/11 to 10/09/11    rectum  . GERD (gastroesophageal reflux disease)   . Ileostomy in place   . Cancer   . Rectal cancer    Past Surgical History:  Past Surgical History  Procedure Laterality Date  . Tubal ligation    . Carpal tunnel release    . Colonoscopy  07/26/2011    Procedure: COLONOSCOPY;  Surgeon: Arlyce Harman, MD;  [facility];  Service: Endoscopy;  Laterality: N/A;  10:40  . Esophagogastroduodenoscopy  07/26/2011    Procedure: ESOPHAGOGASTRODUODENOSCOPY (EGD);  Surgeon: Arlyce Harman, MD;  [facility];  Service: Endoscopy;  Laterality: N/A;  . Esophagogastroduodenoscopy  12/27/2011    Procedure: ESOPHAGOGASTRODUODENOSCOPY (EGD);  Surgeon: Shirley Friar, MD;  Location: [facility];  Service: Endoscopy;  Laterality: N/A;  . Laparoscopic colon resection  12/14/11    diverting ileostomy  . Portacath placement  02/21/2012    Procedure: INSERTION PORT-A-CATH;  Surgeon: [name], MD;  [facility];  Service: General;  Laterality: N/A;  .  Evaluation under anesthesia with anal fissurotomy N/A 11/06/2012    Procedure: Exam Under Anesthesia , Anal Dilation;  Surgeon: [name], MD;  Location: [facility];  Service: General;  Laterality: N/A;  Exam Under Anesthesia , Anal Dilation  . Laparotomy N/A 01/03/2013    Procedure: EXPLORATORY LAPAROTOMY WITH LYSIS OF ADHESIONS, revision of ileostomy;  Surgeon: [name], MD;  [facility];  Service: General;  Laterality: N/A;   Social History:  reports that she has never smoked. She has never used smokeless tobacco. She reports that she does not drink alcohol or use illicit drugs.  Family / Support Systems Marital Status: Married How Long?: 26 yrs Patient Roles: Parent;Spouse Spouse/Significant Other: spouse, Keeli Roberg @ (H) [phone] or ([phone] Children: they have two daughters, aged 53 and 21 yrs and both in college [place]) - uncertain of their availability over summer;  daughter, Finlee Concepcion @ [phone] Other Supports: uncle, [facility]) @ [phone];  local [place]) friend, Park Breed @ [phone] Anticipated Caregiver: self and family Ability/Limitations of Caregiver: Husband had hip replacements and cannot do lifting.  Family can provide supervision but not lifting assistance Caregiver Availability: 24/7 Family Dynamics: pt reports good support from family i.e. sis-in-law currently here from out of state to assist as needed.  Notes that her local in-laws are supportive as well and may be able to assist with meals.  Social History Preferred language: Gujarati Religion: Hindu Cultural Background: Pt is originally from [place] - moved  to [place] approx 20 yrs ago and has been in [place] from her beginning in the states.  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  Making good attempts to speak with me via passey-muir valve - laughs and smiles frequently and notes she is happy "... to hear my voice..."  Notes she is pleased with her strength rebuilding and progress. Will monitor emotional adjustment as she proceeds. Recent Psychosocial Issues: Battle with rectal ca for approx 2 years.  Stress on family with husband also having health issues.   Pyschiatric History: none Substance Abuse History: none  Patient / Family Perceptions, Expectations & Goals Pt/Family understanding of illness & functional limitations: Pt and husband with general understanding that she suffered SBO and surgery that was performed.  Good awareness of her current state of physical deconditioning. 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  Two daughters in college.  Independent. Anticipated changes in roles/activities/participation: Pt may very well need some minimal level of physical assist at d/c.  Daughters may share in this with their father.   Pt/family expectations/goals: "I want to be strong"  [employer]: None Transportation available at discharge: yes  Discharge Planning Living Arrangements: Spouse/significant other Support Systems: Children;Other relatives;Friends/neighbors;Church/faith community Type of Residence: Private residence Insurance Resources: Media planner (specify) (BCBC PPO) Financial Resources: Employment Financial Screen Referred: No Living Expenses: Database administrator Management: Spouse Do you have any problems  obtaining your medications?: No Home Management: pt and husband share Patient/Family Preliminary Plans: Pt plans to return home with her husband who can provide supervision -  Social Work Anticipated Follow Up Needs: HH/OP Expected length of stay: 2 weeks  Clinical Impression Very frail appearing woman of [place] origin lying in bed, but making good attempts at answering questions and engaging with me via passey-muir valve.  Sister-in-law at bedside and notes family is supportive, howeve, husband can only provide supervision.  Pt and family pleased with progress so far.  No noted emotional distress - will monitor.  Olamae Ferrara 01/21/2013, 4:51 PM

## 2013-01-21 NOTE — Progress Notes (Signed)
NUTRITION FOLLOW UP/CONSULT  DOCUMENTATION CODES  Per approved criteria   -Severe malnutrition in the context of acute illness or injury    Intervention:   1. To better meet nutrition needs, will switch to isotonic enteral nutrition formula. Initiate Promote enteral nutrition formula via NGT at 30 ml/hr, advance by 10 ml/hr every 4 hours, to goal rate at 70 ml/hr. This regimen will provide: 1400 kcal, 88 grams protein, 1175 ml free water. Continue free water of 200 ml every 4 hours. 2. RD to continue to follow nutrition care plan  Nutrition Dx:   Inadequate oral intake related to inability to eat as evidenced by NPO status. Ongoing.  Goal:   Intake to meet >90% of estimated nutrition needs.  Monitor:   weight trends, lab trends, I/O's, TF tolerance  Assessment:   Admitted with SBO likely 2/2 post-op adhesions. Pt is is status post laparoscopic total abdominal colectomy, ileal pouch anal anastomosis, and diverting ileostomy. Pt has had to be admitted several times for small bowel obstruction. Pt with rectal CA T3 N1 s/p chemoradiation.  RD consulted for recommendations given ongoing high ostomy output.  Height: Ht Readings from Last 1 Encounters:  01/04/13 4\' 4"  (1.321 m)    Weight Status:   Wt Readings from Last 1 Encounters:  01/20/13 76 lb 0.9 oz (34.5 kg)  Stable.  Re-estimated needs:  Kcal: 1250 - 1400 Protein: 55 - 75 grams Fluid: maintain positive balance  Skin: surgical abdominal incisions  Diet Order: NPO   Intake/Output Summary (Last 24 hours) at 01/21/13 1257 Last data filed at 01/21/13 1223  Gross per 24 hour  Intake    200 ml  Output   1680 ml  Net  -1480 ml    Last BM: 4/28   Labs:   Recent Labs Lab 01/16/13 0435 01/19/13 0705 01/20/13 0615  NA 133* 135 136  K 3.3* 4.0 3.9  CL 96 99 99  CO2 32 29 29  BUN 8 8 10   CREATININE 0.50 0.50 0.48*  CALCIUM 8.2* 8.7 8.8  GLUCOSE 142* 124* 124*    CBG (last 3)   Recent Labs  01/21/13 0447  01/21/13 0759 01/21/13 1156  GLUCAP 114* 127* 99    Scheduled Meds: . antiseptic oral rinse  15 mL Mouth Rinse q12n4p  . enoxaparin (LOVENOX) injection  30 mg Subcutaneous Q24H  . free water  200 mL Per Tube Q4H  . insulin aspart  0-15 Units Subcutaneous Q4H  . loperamide  2 mg Oral Q6H  . LORazepam  0.5 mg Per Tube QHS  . multivitamin  5 mL Oral Daily  . Opium  4 drop Per Tube QID  . pantoprazole sodium  40 mg Per Tube BID  . potassium chloride  20 mEq Oral Daily  . saccharomyces boulardii  250 mg Oral BID  . simethicone  40 mg Oral QID  . thiamine  100 mg Per Tube Daily    Continuous Infusions: . 0.9 % NaCl with KCl 40 mEq / L 30 mL/hr at 01/20/13 1232  . feeding supplement (VITAL 1.5 CAL) 1,000 mL (01/20/13 1232)    Jarold Motto MS, RD, LDN Pager: [phone] After-hours pager: [phone]

## 2013-01-21 NOTE — Progress Notes (Signed)
Occupational Therapy Session Notes  Patient Details  Name: Renee Nicholson MRN: [id number] Date of Birth: 23-Sep-1966  Today's Date: 01/21/2013  Short Term Goals:  Skilled Therapeutic Interventions/Progress Updates:   Session #1 [phone] - 55 Minutes Individual Therapy No complaints of pain Patient found supine in bed. Therapist got patient set-up in order to transfer OOB (feeding tube, oxygen/trach, IV, 02 monitor). Patient engaged in bed mobility and transferred OOB for functional ambulation using rolling walker for toilet transfer on/off elevated toilet seat and toileting. After toileting, patient ambulated -> sink for grooming tasks in standing position (washing hands, washing face, and brushing teeth). Patient refused to get dressed, stating her ostomy bag got in the way of wearing pants. Patient ambulated from room -> ADL apartment with supervision. Once in ADL apartment, patient performed tub/shower transfer on/off tub transfer bench; stepping over tub threshold with steady assist. Patient then ambulated in kitchen, stating she enjoyed cooking. Patient ambulated back to room with supervision and therapist left patient seated in recliner with respiratory therapist and sister in room. Sister present during entire session.   Session #2 [id number] - 30 Minutes Individual therapy No complaints of pain Patient found supine in bed. Patient engaged in bed mobility and ambulated -> bathroom for toilet transfer and toileting (patient ambulated with hand-over-hand steady assist). Patient then ambulated -> therapy gym for UE exercise focusing on UE strengthening, overall balance/tolerance/endurance, and overall activity tolerance/endurance. Patient ambulated back with steady assist and left seated in recliner. SLP present in room at end of sessionl   Precautions:  Precautions Precautions: Fall Precaution Comments: right colostomy, trach Restrictions Weight Bearing Restrictions: No  See FIM for  current functional status  Renee Nicholson 01/21/2013, 7:31 AM

## 2013-01-21 NOTE — Progress Notes (Signed)
Pt's inner cannula was replaced by decannulation plug by RN. RT is at bedside. Pt is doing well, O2 is 98, no distress. Continue to monitor.

## 2013-01-21 NOTE — Consult Note (Signed)
Ostomy follow-up: Bedside nurse had to change ostomy pouch twice yesterday.  Difficult to maintain seal R/T open weeping wound. Applied one piece pouch with barrier ring to maintain seal.  Supplies ordered to room for bedside nurse.  Stoma red and viable, flush with skin level.  Belt can be added with next pouch change to held hold pouch over open wound.  Sister at bedside to translate to pt regarding pouching difficulties and troubleshooting.  Pt was independent with pouching activities prior to admission. Cammie Mcgee MSN, RN, CWOCN, [facility], CNS [phone]

## 2013-01-22 ENCOUNTER — Inpatient Hospital Stay (HOSPITAL_COMMUNITY): Payer: BC Managed Care – PPO

## 2013-01-22 ENCOUNTER — Inpatient Hospital Stay (HOSPITAL_COMMUNITY): Payer: BC Managed Care – PPO | Admitting: Speech Pathology

## 2013-01-22 ENCOUNTER — Inpatient Hospital Stay (HOSPITAL_COMMUNITY): Payer: BC Managed Care – PPO | Admitting: Occupational Therapy

## 2013-01-22 ENCOUNTER — Inpatient Hospital Stay (HOSPITAL_COMMUNITY): Payer: BC Managed Care – PPO | Admitting: Physical Therapy

## 2013-01-22 DIAGNOSIS — K56609 Unspecified intestinal obstruction, unspecified as to partial versus complete obstruction: Secondary | ICD-10-CM

## 2013-01-22 DIAGNOSIS — J9600 Acute respiratory failure, unspecified whether with hypoxia or hypercapnia: Secondary | ICD-10-CM

## 2013-01-22 DIAGNOSIS — R5381 Other malaise: Secondary | ICD-10-CM

## 2013-01-22 LAB — GLUCOSE, CAPILLARY
Glucose-Capillary: 66 mg/dL — ABNORMAL LOW (ref 70–99)
Glucose-Capillary: 94 mg/dL (ref 70–99)

## 2013-01-22 MED ORDER — ENSURE PUDDING PO PUDG
1.0000 | Freq: Two times a day (BID) | ORAL | Status: DC
  Administered 2013-01-24 – 2013-01-27 (×2): 1 via ORAL

## 2013-01-22 MED ORDER — PROMOTE PO LIQD
1000.0000 mL | ORAL | Status: DC
  Administered 2013-01-22: 1000 mL
  Filled 2013-01-22 (×2): qty 1000

## 2013-01-22 MED ORDER — PROMOTE PO LIQD
1000.0000 mL | ORAL | Status: DC
  Filled 2013-01-22: qty 1000

## 2013-01-22 MED ORDER — BOOST / RESOURCE BREEZE PO LIQD
1.0000 | Freq: Two times a day (BID) | ORAL | Status: DC
  Administered 2013-01-22 – 2013-01-28 (×12): 1 via ORAL

## 2013-01-22 MED ORDER — PROMOTE PO LIQD
1000.0000 mL | ORAL | Status: DC
  Filled 2013-01-22 (×2): qty 1000

## 2013-01-22 MED ORDER — FREE WATER
100.0000 mL | Freq: Two times a day (BID) | Status: DC
  Administered 2013-01-22 – 2013-01-26 (×9): 100 mL

## 2013-01-22 MED ORDER — GLUCOSE 40 % PO GEL
ORAL | Status: AC
  Filled 2013-01-22: qty 1

## 2013-01-22 MED ORDER — INSULIN ASPART 100 UNIT/ML ~~LOC~~ SOLN: 0.0000 [IU] | Freq: Every day | SUBCUTANEOUS | Status: DC

## 2013-01-22 MED ORDER — INSULIN ASPART 100 UNIT/ML ~~LOC~~ SOLN: 0.0000 [IU] | Freq: Three times a day (TID) | SUBCUTANEOUS | Status: DC

## 2013-01-22 NOTE — Consult Note (Signed)
Ostomy follow-up: Pouch intact with good seal, applied yesterday.  Pt denies need to change at this time.  Bedside nurse emptying large amt watery stool.  Supplies at bedside for staff use. Cammie Mcgee MSN, RN, CWOCN, [place], CNS [phone]

## 2013-01-22 NOTE — Progress Notes (Signed)
Pt refused abdominal dressing change. The benefit of changing dressing on a regular basis was explained. The current dressing is C/D/I.

## 2013-01-22 NOTE — Progress Notes (Signed)
Occupational Therapy Session Note  Patient Details  Name: Sandara Tyree MRN: [id number] Date of Birth: 06-16-1966  Today's Date: 01/22/2013 Time: 7829-5621 Time Calculation (min): 55 min  Skilled Therapeutic Interventions/Progress Updates:  Patient found supine in bed with HOB raised eating breakfast. IV RN present for IV management. Patient then engaged in bed mobility and ambulated throughout room with occasional steady assist to gather necessary items for ADL, during ambulation patient managing IV pole. Patient then engaged in ADL retraining at sink level in standing position with close supervision. Patient donned two hospital gowns and bilateral socks; she refused street clothes . From room, patient ambulated -> therapy gym with close supervision and without an AD for therapeutic exercise focusing on dynamic standing balance/tolerance/endurance, UB strengthening, and functional mobility(picking items off floor). Patient left seated on therapy mat for next therapy session.   Precautions:  Precautions Precautions: Fall Precaution Comments: right colostomy, trach Restrictions Weight Bearing Restrictions: No  See FIM for current functional status  Therapy/Group: Individual Therapy  Kimimila Tauzin 01/22/2013, 8:33 AM

## 2013-01-22 NOTE — Procedures (Signed)
**Note De-Identified Devan Babino Obfuscation** Tracheostomy Change Note  Patient Details:   Name: Renee Nicholson DOB: 09-Oct-1965 MRN: [id number]    Airway Documentation:     Evaluation  O2 sats: stable throughout Complications: No apparent complications Patient did tolerate procedure well. Bilateral Breath Sounds: Clear  tracheostomy decannulation per MD, patient tolerated procedure well. Guaze dressing secure.  Chyann Ambrocio, Megan Salon 01/22/2013, 9:47 AM

## 2013-01-22 NOTE — Patient Care Conference (Signed)
Inpatient RehabilitationTeam Conference and Plan of Care Update Date: 01/21/13   Time: 2:05 PM    Patient Name: Renee Nicholson      Medical Record Number: [id number]  Date of Birth: [birth year] Sex: Female         Room/Bed: 4025/4025-01 Payor Info: Payor: BLUE CROSS BLUE SHIELD  Plan: BCBS [place] PPO  Product Type: *No Product type*     Admitting Diagnosis: deconditioned  Admit Date/Time:  01/17/2013  6:35 PM Admission Comments: No comment available   Primary Diagnosis:  Physical deconditioning Principal Problem: Physical deconditioning  Patient Active Problem List   Diagnosis Date Noted  . Physical deconditioning 01/20/2013  . 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 Acute respiratory failure 01/02/2013  . Shock 01/01/2013  . SIRS (systemic inflammatory response syndrome) 01/01/2013  . SBO (small bowel obstruction) 11/17/2012  . Hypokalemia 11/17/2012  . Hx of radiation therapy   . 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 Thyroid mass 10/02/2011  . Rectal cancer s/p proctocolectomy, j-pouch with ileoanal anastomosis, loop ileostomy on 12/14/11 08/16/2011    Expected Discharge Date: Expected Discharge Date: 01/28/13  Team Members Present: Physician leading conference: Dr. Faith Rogue Social Worker Present: Amada Jupiter, LCSW Nurse Present: Daryll Brod, RN PT Present: Karolee Stamps, PT;Other (comment) Corinna Capra., PT) OT Present: Edwin Cap, Loistine Chance, OT Other (Discipline and Name): Tora Duck, PPS Coordinator;  Bayard Hugger, RN;  Ottie Glazier, RN     Current Status/Progress Goal Weekly Team Focus  Medical   rectal cancer with SBO, lysis of adhesions, respiratory failure  stabilize nutritional, abdominal, and ostomy, respiratory issues  downsize and dc trach. move to oral diet   Bowel/Bladder   Continent of bladder. Ileostomy RUQ.   Decreased liquid stool to ostomy pouch  Monitor for decrease liquid stool   Swallow/Nutrition/ Hydration   NPO, BSE today  TBD  TBD    ADL's   overall supervision -> steady assist  overall mod I -> supervision  ADL retraining, ADL transfers, dynamic standing balance/tolerance/endurance, UB strengthening, overall activity tolerance/endurance   Mobility   supervision bed mobility, min guard assist for gait (occational small LOB) >250' at a time with RW.  Pt very deconditioned with visible muscle atrophy.  Muscle strength and balance are main issues.  Vitals have been stable on 28% TC during therapy.  No recommendations made on eval.  She could likely go home in 10 days with HHPT and RW.  WC may be needed for community access.    goals for independent and modified independent, but it looks like she has 24 hour assit from family at discharge.  She may be able to go home at a supervision level.    Gait, strength, stair training, balance.     Communication   Supervision with PMSV, tolerating during all waking hours  Mod I  increased vocal intensity    Safety/Cognition/ Behavioral Observations            Pain   No c/o pain  <3  Monitor   Skin   #4 Shilley trach. Abominal incision with wet/dry dressing. Red stoma  No additional skin breakdown  Monitor for appropriate healing    Rehab Goals Patient on target to meet rehab goals: Yes *See Care Plan and progress notes for long and short-term goals.  Barriers to Discharge: fatigue, post-surgical issues     Possible Resolutions to Barriers:  rx above, family ed    Discharge Planning/Teaching Needs:  home with family who can provide 24/7 supervision if  needed.      Team Discussion:  Making excellent gains and hope to d/c trach tomorrow.  Anticipate no O2 at d/c and hope for mod i goals.    Revisions to Treatment Plan:  None   Continued Need for Acute Rehabilitation Level of Care: The patient requires daily medical management by a physician with specialized training in physical medicine and rehabilitation for the following conditions: Daily direction of a multidisciplinary  physical rehabilitation program to ensure safe treatment while eliciting the highest outcome that is of practical value to the patient.: Yes Daily medical management of patient stability for increased activity during participation in an intensive rehabilitation regime.: Yes Daily analysis of laboratory values and/or radiology reports with any subsequent need for medication adjustment of medical intervention for : Post surgical problems;Other;Pulmonary problems (ostomy)  Renee Nicholson 01/22/2013, 11:04 AM

## 2013-01-22 NOTE — Progress Notes (Signed)
Speech Language Pathology Daily Session Note  Patient Details  Name: Renee Nicholson MRN: [id number] Date of Birth: [birth year]  Today's Date: 01/22/2013 Time: 8295-6213 Time Calculation (min): 30 min  Short Term Goals: Week 1: SLP Short Term Goal 1 (Week 1): Patient will perform pharyngeal strengthening exercises with min A verbal cues, in order to decrease aspiration risk.  SLP Short Term Goal 1 - Progress (Week 1): Progressing toward goal SLP Short Term Goal 2 (Week 1): Patient will be able to produce voice with PMV with supervision and cues, in order to verbally express wants/needs to family/staff.  SLP Short Term Goal 2 - Progress (Week 1): Met SLP Short Term Goal 3 (Week 1): Patient will consume Dys.2 textures and thin liquids with supervision level verbal cues to utilize safe swallow compensatory strategies. SLP Short Term Goal 3 - Progress (Week 1): Progressing toward goal  Skilled Therapeutic Interventions: Skilled treatment session focused on addressing dysphagia goals.  Patient decannulated earlier today per MD order.  SLP facilitated session by educating patient and sister-in-law regarding hand occlusion technique over stoma bandage to improve vocal intensity and cough.  SLP also facilitated session with trials of Dys.2 textures and thin liquids via cup; patient consumed trials with supervision level verbal cues to consume small single sips.  Cough elicited with consecutive sips which patient appeared to clear with hard cough during hand occlusion of stoma.  Continue current plan of care.     FIM:  Comprehension Comprehension Mode: Auditory Comprehension: 5-Follows basic conversation/direction: With no assist Expression Expression Mode: Verbal Expression: 5-Expresses basic 90% of the time/requires cueing < 10% of the time. Social Interaction Social Interaction: 5-Interacts appropriately 90% of the time - Needs monitoring or encouragement for participation or interaction. Problem  Solving Problem Solving: 5-Solves basic 90% of the time/requires cueing < 10% of the time Memory Memory: 5-Recognizes or recalls 90% of the time/requires cueing < 10% of the time FIM - Eating Eating Activity: 5: Set-up assist for open containers;5: Supervision/cues  Pain Pain Assessment Pain Assessment: No/denies pain  Therapy/Group: Individual Therapy  Charlane Ferretti., CCC-SLP [phone]  Duayne Brideau 01/22/2013, 12:43 PM

## 2013-01-22 NOTE — Progress Notes (Signed)
Pt's trach has remained capped all night per order. Pt has remained off O2 all night HR: 82, Sats: 98-100%. Pt has rested all night and tolerated trach being capped well. RT will continue to monitor.

## 2013-01-22 NOTE — Significant Event (Signed)
Hypoglycemic Event  CBG: 66  Treatment: 15 GM carbohydrate snack  Symptoms: None  Follow-up CBG: Time:1315 CBG Result:94  Possible Reasons for Event: Inadequate meal intake  Comments/MD notified:yes    Renee Nicholson  Remember to initiate Hypoglycemia Order Set & complete

## 2013-01-22 NOTE — Progress Notes (Signed)
Physical Therapy Session Note  Patient Details  Name: Renee Nicholson MRN: [id number] Date of Birth: 1966-08-05  Today's Date: 01/22/2013 Time: 0830-0929 Time Calculation (min): 59 min  Short Term Goals: Week 1:  PT Short Term Goal 1 (Week 1): Bed mobility supervision bed flat no rails.   PT Short Term Goal 2 (Week 1): Transfers bed<-> WC supervision with LRAD.   PT Short Term Goal 3 (Week 1): Gait with RW supervision >150' PT Short Term Goal 4 (Week 1): Berg Balance>45/56 to demonstrate that pt may not need assistive device for balance at discharge.   PT Short Term Goal 5 (Week 1): Ambulate without assistive device with min assist >150' before needing a rest break.    Skilled Therapeutic Interventions/Progress Updates:    Adminstered Berg balance test and discussed results with pt/family who both verbalized understanding - discussed recommendation for trial of quad cane/SPC at this time for increased safety with gait/balance. Training with both wide based and SPC (did not find a small based quad cane on unit appropriate for size of pt) Pt close S overall but min A needed with 2 x LOB occurred. Discussed with social worked about obtaining youth sized small base quad cane as pt states she still feels unsteady with just SPC. Simulated car transfer with recommendation to transfer sitting on seat first and then bring legs in (same for reverse) for safety. Pt safely return demonstrated safe technique. Stair training with 1 rail and use of SPC with S up/down 4 steps to simulate home entry. Continued gait training on unit and on carpeted surfaces using SPC (more size appropriate for pt) > 250'. Handout issued for [facility] for strengthening and balance exercises.   Therapy Documentation Precautions:  Precautions Precautions: Fall Precaution Comments: right colostomy, trach Restrictions Weight Bearing Restrictions: No   Vital Signs: Room air sat > 98%  Pain: Denies pain.   Locomotion  : Ambulation Ambulation/Gait Assistance: 4: Min guard     Balance: Hospital doctor Sit to Stand: Able to stand without using hands and stabilize independently Standing Unsupported: Able to stand safely 2 minutes Sitting with Back Unsupported but Feet Supported on Floor or Stool: Able to sit safely and securely 2 minutes Stand to Sit: Sits safely with minimal use of hands Transfers: Able to transfer safely, minor use of hands Standing Unsupported with Eyes Closed: Able to stand 10 seconds safely Standing Ubsupported with Feet Together: Able to place feet together independently and stand 1 minute safely From Standing, Reach Forward with Outstretched Arm: Can reach forward >5 cm safely (2") From Standing Position, Pick up Object from Floor: Unable to pick up and needs supervision (able to squat low safely but limited d/t bending restriction) From Standing Position, Turn to Look Behind Over each Shoulder: Looks behind from both sides and weight shifts well Turn 360 Degrees: Able to turn 360 degrees safely one side only in 4 seconds or less Standing Unsupported, Alternately Place Feet on Step/Stool: Able to stand independently and complete 8 steps >20 seconds (8 steps with Supervision) Standing Unsupported, One Foot in Front: Able to place foot tandem independently and hold 30 seconds Standing on One Leg: Able to lift leg independently and hold 5-10 seconds Total Score: 48  See FIM for current functional status  Therapy/Group: Individual Therapy  Karolee Stamps [facility] 01/22/2013, 9:34 AM

## 2013-01-22 NOTE — Progress Notes (Signed)
Social Work Patient ID: Renee Nicholson, female   DOB: 1966/06/23, 47 y.o.   MRN: [id number]  Met with patient and sister-in-law today to review team conference.  Both aware and agreeable with targeted d/c date of 5/6 at mod i goals overall.  Sister-in-law asks about receiving HHRN and therapies.  Will arrange prior to d/c.  Continue to follow.  Javen Hinderliter, LCSW

## 2013-01-22 NOTE — Significant Event (Signed)
Hypoglycemic Event  CBG: 69  Treatment: 15 GM carbohydrate snack  Symptoms: None  Follow-up CBG: Time:1230 CBG Result:66  Possible Reasons for Event: Inadequate meal intake  Comments/MD notified:Notified Marissa Nestle, PA    Madie Reno  Remember to initiate Hypoglycemia Order Set & complete

## 2013-01-22 NOTE — Progress Notes (Signed)
Patient ID: Renee Nicholson, female   DOB: 02/18/1966, 47 y.o.   MRN: [id number] Pt ileostomy output down from 1800/1900 to 1350 in 24 hours with DTO and added oral diet.    Working with physical therapy.  Will see what happens as diet advanced.    Pt may need home IV therapy if ostomy output remains high.

## 2013-01-22 NOTE — Progress Notes (Signed)
Subjective/Complaints: Ostomy output better yesterday. Started on diet.   Objective: Vital Signs: Blood pressure 110/66, pulse 80, temperature 98.6 F (37 C), temperature source Oral, resp. rate 18, weight 34.5 kg (76 lb 0.9 oz), last menstrual period 02/20/2012, SpO2 100.00%. No results found.  Recent Labs  01/20/13 0615  WBC 8.0  HGB 9.5*  HCT 27.6*  PLT 362    Recent Labs  01/20/13 0615  NA 136  K 3.9  CL 99  GLUCOSE 124*  BUN 10  CREATININE 0.48*  CALCIUM 8.8   CBG (last 3)   Recent Labs  01/22/13 0016 01/22/13 0352 01/22/13 0756  GLUCAP 95 117* 145*    Wt Readings from Last 3 Encounters:  01/20/13 34.5 kg (76 lb 0.9 oz)  01/17/13 35.925 kg (79 lb 3.2 oz)  01/17/13 35.925 kg (79 lb 3.2 oz)    Physical Exam:  Vitals reviewed.  Constitutional:   NG tube in place, no distress, frail appearing/petite  Eyes: EOM are normal.  Neck:  Trach tube in place with plug , #4, minimal secretions. sats 100%. able to speak around trach Cardiovascular: Normal rate and regular rhythm.  Pulmonary/Chest: Effort normal and breath sounds normal. No respiratory distress. She has no wheezes. No cough. Occasional upper airways sounds. Abdominal: Soft. Bowel sounds are normal. Appropriately tender Neurological: She is alert. She has normal reflexes. No cranial nerve deficit. She exhibits normal muscle tone.  Makes good eye contact with examiner.generally pleasant and alert. Follows all simple commands. Strength remains 3+ to 4/5 UE's. LE's still 2- to 2/5 at HF prox to 3+/4 distally. No sensory deficits. DTR's 1+  Skin:  Surgical site dressed /colostomy in place with some gas. Close proximity to healing abdominal wound whic his granulating in. Psychiatric:  Quiet, flat    Assessment/Plan: 1. Functional deficits secondary to severe deconditioning after SBO, exlap, lysis of adhesions, ileostomy revision which require 3+ hours per day of interdisciplinary therapy in a  comprehensive inpatient rehab setting. Physiatrist is providing close team supervision and 24 hour management of active medical problems listed below. Physiatrist and rehab team continue to assess barriers to discharge/monitor patient progress toward functional and medical goals. FIM: FIM - Bathing Bathing Steps Patient Completed: Chest;Right Arm;Left Arm;Abdomen;Front perineal area;Buttocks;Right upper leg;Left upper leg Bathing: 4: Min-Patient completes 8-9 58f 10 parts or 75+ percent  FIM - Upper Body Dressing/Undressing Upper body dressing/undressing: 0: Wears gown/pajamas-no public clothing FIM - Lower Body Dressing/Undressing Lower body dressing/undressing: 0: Wears gown/pajamas-no public clothing  FIM - Toileting Toileting steps completed by patient: Adjust clothing prior to toileting;Performs perineal hygiene;Adjust clothing after toileting Toileting: 5: Supervision: Safety issues/verbal cues  FIM - Archivist Transfers Assistive Devices: Elevated toilet seat;Grab bars;Walker Toilet Transfers: 4-To toilet/BSC: Min A (steadying Pt. > 75%);4-From toilet/BSC: Min A (steadying Pt. > 75%) (steady assist)  FIM - Press photographer Assistive Devices: HOB elevated Bed/Chair Transfer: 5: Sit > Supine: Supervision (verbal cues/safety issues);4: Chair or W/C > Bed: Min A (steadying Pt. > 75%)  FIM - Locomotion: Wheelchair Locomotion: Wheelchair: 0: Activity did not occur FIM - Locomotion: Ambulation Locomotion: Ambulation Assistive Devices: Designer, industrial/product Ambulation/Gait Assistance: 4: Min assist Locomotion: Ambulation: 4: Travels 150 ft or more with minimal assistance (Pt.>75%)  Comprehension Comprehension Mode: Auditory Comprehension: 5-Follows basic conversation/direction: With no assist  Expression Expression Mode: Verbal Expression Assistive Devices: 6-Other (Comment) Expression: 5-Expresses basic 90% of the time/requires cueing < 10% of the  time.  Social Interaction Social Interaction: 5-Interacts appropriately  90% of the time - Needs monitoring or encouragement for participation or interaction.  Problem Solving Problem Solving Mode: Not assessed Problem Solving: 5-Solves basic 90% of the time/requires cueing < 10% of the time  Memory Memory: 5-Recognizes or recalls 90% of the time/requires cueing < 10% of the time  Medical Problem List and Plan:  1. Deconditioning after small bowel obstruction. Status post exploratory lap, lysis of adhesions ileostomy revision-postop respiratory failure  2. DVT Prophylaxis/Anticoagulation: Subcutaneous Lovenox. Monitor platelet counts any signs of bleeding  3. Mood: Ativan 0.5 mg each bedtime. Provide emotional support. Family appears quite positive and supportive as well.  4. Neuropsych: This patient is capable of making decisions on his/her own behalf.  5. Dysphagia. Currently n.p.o. Continue nasogastric tube feeds and follow per speech therapy.  6. Respiratory failure. 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 --managing secretions well  -dc trach today -occlusive dressing 7. History of rectal cancer. No plan for ongoing radiation therapy at this time  8. Ileostomy: immodium, simethicone -probiotic, opium per surgery -bag being emptied q2 hours to help protect abdominal wounds.  -started D2 thins yesterday -reduce formula rate.  LOS (Days) 5 A FACE TO FACE EVALUATION WAS PERFORMED  SWARTZ,ZACHARY T 01/22/2013 8:31 AM

## 2013-01-22 NOTE — Progress Notes (Signed)
NUTRITION FOLLOW UP  DOCUMENTATION CODES  Per approved criteria   -Severe malnutrition in the context of acute illness or injury    Intervention:   1. Initiate 48-hour calorie count per MD discretion. Please hang calorie count envelope on the patient's door. Document percent consumed for each item on the patient's meal tray ticket and keep in envelope. Also document percent of any supplement or snack pt consumes and keep documentation in envelope for RD to review.  2. To better meet nutrition needs, will change regimen to Promote enteral nutrition formula via NGT at 65 ml/hr x 12 hours - this will provide 780 calories, 49 grams protein, 654 ml free water. Note that this regimen only provides 62% calorie needs and 89% of protein needs. Strongly recommend not decreasing this rate while oral intake remains poor. Will adjust rate based on calorie count results.  3. RD to add Resource Breeze and Ensure Pudding BID per family request 4. RD to continue to follow nutrition care plan  Nutrition Dx:   Inadequate oral intake now related to poor appetite AEB poor meal completion.  Goal:   Intake to meet >90% of estimated nutrition needs.  Monitor:   weight trends, lab trends, I/O's, PO intake, supplement tolerance  Assessment:   Admitted with SBO likely 2/2 post-op adhesions. Pt is is status post laparoscopic total abdominal colectomy, ileal pouch anal anastomosis, and diverting ileostomy. Pt has had to be admitted several times for small bowel obstruction. Pt with rectal CA T3 N1 s/p chemoradiation.   RD consulted for calorie count.  BSE completed by SLP yesterday with recommendations for Dysphagia 2 diet with thin liquids. TF formula rate decreased by MD to help with output, however oral intake remains insufficient at this time and family is requesting pt meets more of her nutrition needs via TF as tolerated. 48-hour calorie count initiated.  Dietary recall PTA reveals: Boost or Breeze BID; no  meat, will eat eggs, lots of rice and vegetables. Pt does not eat much at baseline per family report. Suspect calorie counts results will reflect that.  Pt is at high risk for dehydration given ongoing high ostomy output (>1 liter.)  Free water flushes of 100 ml via tube decreased to BID. Receiving NS with KCL at 30 ml/hr. Currently ordered for Promote at 40 ml/hr x 12 hours. Total regimen provides 480 kcal, 30 grams protein, 1323 ml free water.  RD reviewed food choices with sister and helped her order lunch and dinner for patient. Provided her with information on how to contact [facility] to order meals.  Height: Ht Readings from Last 1 Encounters:  01/04/13 4\' 4"  (1.321 m)    Weight Status:   Wt Readings from Last 1 Encounters:  01/22/13 73 lb (33.113 kg)    Re-estimated needs:  Kcal: 1250 - 1400 Protein: 55 - 75 grams Fluid: maintain positive balance  Skin: surgical abdominal incisions  Diet Order: Dysphagia 2 with thins   Intake/Output Summary (Last 24 hours) at 01/22/13 1058 Last data filed at 01/22/13 0921  Gross per 24 hour  Intake    570 ml  Output   1510 ml  Net   -940 ml    Last BM: 1.35 liters via ostomy yesterday  Labs:   Recent Labs Lab 01/16/13 0435 01/19/13 0705 01/20/13 0615  NA 133* 135 136  K 3.3* 4.0 3.9  CL 96 99 99  CO2 32 29 29  BUN 8 8 10   CREATININE 0.50 0.50 0.48*  CALCIUM  8.2* 8.7 8.8  GLUCOSE 142* 124* 124*    CBG (last 3)   Recent Labs  01/22/13 0016 01/22/13 0352 01/22/13 0756  GLUCAP 95 117* 145*    Scheduled Meds: . antiseptic oral rinse  15 mL Mouth Rinse q12n4p  . enoxaparin (LOVENOX) injection  30 mg Subcutaneous Q24H  . feeding supplement (PROMOTE)  1,000 mL Per Tube Q24H  . free water  200 mL Per Tube Q8H  . insulin aspart  0-15 Units Subcutaneous Q4H  . loperamide  2 mg Oral Q6H  . LORazepam  0.5 mg Per Tube QHS  . multivitamin  5 mL Oral Daily  . Opium  4 drop Per Tube QID  . pantoprazole sodium  40 mg Per Tube  BID  . potassium chloride  20 mEq Oral Daily  . saccharomyces boulardii  250 mg Oral BID  . simethicone  40 mg Oral QID  . thiamine  100 mg Per Tube Daily    Continuous Infusions: . 0.9 % NaCl with KCl 40 mEq / L 30 mL/hr at 01/22/13 1610    Jarold Motto MS, RD, LDN Pager: [phone] After-hours pager: [phone]

## 2013-01-22 NOTE — Progress Notes (Signed)
Physical Therapy Note  Patient Details  Name: Renee Nicholson MRN: [id number] Date of Birth: 1966/07/02 Today's Date: 01/22/2013  Pt missed 60 min of skilled PT group therapy secondary to medical complication of low blood glucose level (per RN).    Sherrine Maples Cheek 01/22/2013, 4:21 PM

## 2013-01-23 ENCOUNTER — Inpatient Hospital Stay (HOSPITAL_COMMUNITY): Payer: BC Managed Care – PPO | Admitting: Occupational Therapy

## 2013-01-23 ENCOUNTER — Inpatient Hospital Stay (HOSPITAL_COMMUNITY): Payer: BC Managed Care – PPO | Admitting: Speech Pathology

## 2013-01-23 ENCOUNTER — Inpatient Hospital Stay (HOSPITAL_COMMUNITY): Payer: BC Managed Care – PPO

## 2013-01-23 LAB — GLUCOSE, CAPILLARY: Glucose-Capillary: 140 mg/dL — ABNORMAL HIGH (ref 70–99)

## 2013-01-23 LAB — CBC
HCT: 26.2 % — ABNORMAL LOW (ref 36.0–46.0)
MCHC: 34 g/dL (ref 30.0–36.0)
MCV: 87 fL (ref 78.0–100.0)
Platelets: 288 10*3/uL (ref 150–400)
RDW: 15.5 % (ref 11.5–15.5)
WBC: 6.3 10*3/uL (ref 4.0–10.5)

## 2013-01-23 MED ORDER — MEGESTROL ACETATE 400 MG/10ML PO SUSP
400.0000 mg | Freq: Two times a day (BID) | ORAL | Status: DC
  Administered 2013-01-23 – 2013-01-27 (×10): 400 mg via ORAL
  Filled 2013-01-23 (×13): qty 10

## 2013-01-23 MED ORDER — SODIUM CHLORIDE 0.9 % IJ SOLN
10.0000 mL | INTRAMUSCULAR | Status: DC | PRN
  Administered 2013-01-23 – 2013-01-28 (×6): 10 mL

## 2013-01-23 MED ORDER — SODIUM CHLORIDE 0.9 % IJ SOLN: 10.0000 mL | Freq: Two times a day (BID) | INTRAMUSCULAR | Status: DC

## 2013-01-23 NOTE — Progress Notes (Signed)
Physical Therapy Session Note  Patient Details  Name: Renee Nicholson MRN: [id number] Date of Birth: 07-01-1966  Today's Date: 01/23/2013 Time: 4098-1191 Time Calculation (min): 26 min  Short Term Goals: Week 1:  PT Short Term Goal 1 (Week 1): Bed mobility supervision bed flat no rails.   PT Short Term Goal 2 (Week 1): Transfers bed<-> WC supervision with LRAD.   PT Short Term Goal 3 (Week 1): Gait with RW supervision >150' PT Short Term Goal 4 (Week 1): Berg Balance>45/56 to demonstrate that pt may not need assistive device for balance at discharge.   PT Short Term Goal 5 (Week 1): Ambulate without assistive device with min assist >150' before needing a rest break.    Skilled Therapeutic Interventions/Progress Updates:   Session focused on dynamic gait and balance activities without AD through obstacle course and kicking/throwing/catching ball with another patient. Pt S overall and when LOB occurred able to correct without A. Excellent participation and progress.  Therapy Documentation Precautions:  Precautions Precautions: Fall Precaution Comments: right colostomy, trach Restrictions Weight Bearing Restrictions: No  Pain: Pain Assessment Pain Assessment: No/denies pain Pain Score: 0-No pain Locomotion : Ambulation Ambulation/Gait Assistance: 5: Supervision   See FIM for current functional status  Therapy/Group: Individual Therapy  Karolee Stamps [facility] 01/23/2013, 3:43 PM

## 2013-01-23 NOTE — Progress Notes (Signed)
Occupational Therapy Session Note  Patient Details  Name: Renee Nicholson MRN: [id number] Date of Birth: June 30, 1966  Today's Date: 01/23/2013 Time: 1100-1200 Time Calculation (min): 60 min  Skilled Therapeutic Interventions/Progress Updates:  No complaints of pain Patient found seated in recliner. Patient ambulated -> bathroom without AD for toilet transfer and toileting. Patient then ambulated -> sink for grooming task of washing hands. Patient then ambulated -> ADL apartment without AD for therapeutic activity focusing on baking in the kitchen. Patient baked cookies at a distant supervision level ambulating throughout kitchen without an AD. Patient also cleaned up after baking. One rest break taken while cookies were in oven. Patient also engaged in tub/shower transfer at mod I level; recommend patient have a shower seat for showers at home - notified SW. Patient ambulated back to room with supervision and left seated in recliner with sister present in room and call bell, phone, & lunch tray within reach.   Precautions:  Precautions Precautions: Fall Precaution Comments: right colostomy, trach Restrictions Weight Bearing Restrictions: No  See FIM for current functional status  Therapy/Group: Individual Therapy  Gary Gabrielsen 01/23/2013, 12:10 PM

## 2013-01-23 NOTE — Progress Notes (Signed)
Patient ID: Renee Nicholson, female   DOB: 08-May-1966, 47 y.o.   MRN: [id number]    Subjective: Doing well.  Tube feeds off at night.  Trach d/c'd.  Eating more.     Objective: Vital signs in last 24 hours: Temp:  [97.4 F (36.3 C)-97.9 F (36.6 C)] 97.4 F (36.3 C) (05/01 0400) Pulse Rate:  [81-88] 88 (05/01 0400) Resp:  [16] 16 (05/01 0400) BP: (107-112)/(65-71) 107/65 mmHg (05/01 0400) SpO2:  [100 %] 100 % (05/01 0400) Weight:  [73 lb 13.7 oz (33.5 kg)] 73 lb 13.7 oz (33.5 kg) (05/01 0400) Last BM Date: 01/22/13  Intake/Output from previous day: 04/30 0701 - 05/01 0700 In: 600 [P.O.:600] Out: 1150 [Stool:1150] Intake/Output this shift:    General appearance: alert, cooperative and no distress Cardio: regular rate and rhythm GI: soft, non distended, approp tender.  Ostomy improved consistency.   Extremities: extremities normal, atraumatic, no cyanosis or edema  Lab Results:   Recent Labs  01/23/13 0520  WBC 6.3  HGB 8.9*  HCT 26.2*  PLT 288   BMET No results found for this basename: NA, K, CL, CO2, GLUCOSE, BUN, CREATININE, CALCIUM,  in the last 72 hours PT/INR No results found for this basename: LABPROT, INR,  in the last 72 hours ABG No results found for this basename: PHART, PCO2, PO2, HCO3,  in the last 72 hours  Studies/Results: No results found.  Anti-infectives: Anti-infectives   None      Assessment/Plan: s/p * No surgery found * Ostomy output improved.  DTO/reg food helped.       Will see what happens before discharge to see if she needs IVF at home.      LOS: 6 days    [facility] 01/23/2013

## 2013-01-23 NOTE — Progress Notes (Signed)
Subjective/Complaints: Fair night. Slept well. Appetite minimal. Ate more at lunch. Bag still leaking.  Objective: Vital Signs: Blood pressure 107/65, pulse 88, temperature 97.4 F (36.3 C), temperature source Oral, resp. rate 16, weight 33.5 kg (73 lb 13.7 oz), last menstrual period 02/20/2012, SpO2 100.00%. No results found.  Recent Labs  01/23/13 0520  WBC 6.3  HGB 8.9*  HCT 26.2*  PLT 288   No results found for this basename: NA, K, CL, CO, GLUCOSE, BUN, CREATININE, CALCIUM,  in the last 72 hours CBG (last 3)   Recent Labs  01/22/13 1644 01/22/13 2111 01/23/13 0738  GLUCAP 144* 107* 140*    Wt Readings from Last 3 Encounters:  01/23/13 33.5 kg (73 lb 13.7 oz)  01/17/13 35.925 kg (79 lb 3.2 oz)  01/17/13 35.925 kg (79 lb 3.2 oz)    Physical Exam:  Vitals reviewed.  Constitutional:   NG tube in place, no distress, frail appearing/petite  Eyes: EOM are normal.  Neck:  Trach out/ stoma pink. No air heard leaking around when she speaks. Voice a little hoarse. Cardiovascular: Normal rate and regular rhythm.  Pulmonary/Chest: Effort normal and breath sounds normal. No respiratory distress. She has no wheezes. No cough. Occasional upper airways sounds. Abdominal: Soft. Bowel sounds are normal. Appropriately tender Neurological: She is alert. She has normal reflexes. No cranial nerve deficit. She exhibits normal muscle tone.  Makes good eye contact with examiner.generally pleasant and alert. Follows all simple commands. Strength remains 3+ to 4/5 UE's. LE's still 2- to 2/5 at HF prox to 3+/4 distally. No sensory deficits. DTR's 1+  Skin:  Surgical site dressed /colostomy in place with some gas. Close proximity to healing abdominal wound which continues to granulate in.  Psychiatric:  Quiet, flat    Assessment/Plan: 1. Functional deficits secondary to severe deconditioning after SBO, exlap, lysis of adhesions, ileostomy revision which require 3+ hours per day of  interdisciplinary therapy in a comprehensive inpatient rehab setting. Physiatrist is providing close team supervision and 24 hour management of active medical problems listed below. Physiatrist and rehab team continue to assess barriers to discharge/monitor patient progress toward functional and medical goals. FIM: FIM - Bathing Bathing Steps Patient Completed: Chest;Right Arm;Left Arm;Abdomen;Front perineal area;Buttocks;Right upper leg;Left upper leg;Right lower leg (including foot);Left lower leg (including foot) Bathing: 5: Supervision: Safety issues/verbal cues  FIM - Upper Body Dressing/Undressing Upper body dressing/undressing: 0: Wears gown/pajamas-no public clothing FIM - Lower Body Dressing/Undressing Lower body dressing/undressing: 0: Wears gown/pajamas-no public clothing  FIM - Toileting Toileting steps completed by patient: Adjust clothing prior to toileting;Performs perineal hygiene;Adjust clothing after toileting Toileting: 5: Supervision: Safety issues/verbal cues  FIM - Archivist Transfers Assistive Devices: Elevated toilet seat;Grab bars;Walker Toilet Transfers: 4-To toilet/BSC: Min A (steadying Pt. > 75%);4-From toilet/BSC: Min A (steadying Pt. > 75%) (steady assist)  FIM - Banker Devices: Teacher, music: 6: Supine > Sit: No assist;5: Chair or W/C > Bed: Supervision (verbal cues/safety issues)  FIM - Locomotion: Wheelchair Locomotion: Wheelchair: 0: Activity did not occur FIM - Locomotion: Ambulation Locomotion: Ambulation Assistive Devices: Emergency planning/management officer Ambulation/Gait Assistance: 4: Min guard Locomotion: Ambulation: 4: Travels 150 ft or more with minimal assistance (Pt.>75%)  Comprehension Comprehension Mode: Auditory Comprehension: 5-Understands complex 90% of the time/Cues < 10% of the time  Expression Expression Mode: Verbal Expression Assistive Devices: 6-Other (Comment) Expression:  5-Expresses complex 90% of the time/cues < 10% of the time  Social Interaction Social Interaction: 5-Interacts appropriately 90%  of the time - Needs monitoring or encouragement for participation or interaction.  Problem Solving Problem Solving Mode: Not assessed Problem Solving: 5-Solves complex 90% of the time/cues < 10% of the time  Memory Memory: 5-Recognizes or recalls 90% of the time/requires cueing < 10% of the time  Medical Problem List and Plan:  1. Deconditioning after small bowel obstruction. Status post exploratory lap, lysis of adhesions ileostomy revision-postop respiratory failure  2. DVT Prophylaxis/Anticoagulation: Subcutaneous Lovenox. Monitor platelet counts any signs of bleeding  3. Mood: Ativan 0.5 mg each bedtime. Provide emotional support. Family appears quite positive and supportive as well.  4. Neuropsych: This patient is capable of making decisions on his/her own behalf.  5. Dysphagia. Currently n.p.o. Continue nasogastric tube feeds and follow per speech therapy.  6. Respiratory failure. 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 -trach out -occlusive dressing 7. History of rectal cancer. No plan for ongoing radiation therapy at this time  8. Ileostomy: immodium, simethicone -probiotic, opium per surgery -meticulous wound/ostomy mgt  -started D2 thins yesterday -hold HS formula to increase app, hold IVF to see how she sustains on her own -closely follow labs -megace initiated to increase appetite  LOS (Days) 6 A FACE TO FACE EVALUATION WAS PERFORMED  SWARTZ,ZACHARY T 01/23/2013 8:51 AM

## 2013-01-23 NOTE — Progress Notes (Signed)
Occupational Therapy Session Note  Patient Details  Name: Kittie Krizan MRN: [id number] Date of Birth: 07/04/1966  Today's Date: 01/23/2013 Time: 1300-1330 Time Calculation (min): 30 min  Skilled Therapeutic Interventions/Progress Updates:  Patient ambulated -> therapy gym with supervision and no assistive device. In therapy gym patient engaged in therapeutic activities focusing on UB strengthening (using SCIFIT machine and 2lb weighted bar), dynamic standing balance/tolerance/endurance, dynamic gait activity kicking a soccer ball, and functional mobility pushing a shopping cart and picking up items from various places in the gym (including the floor). Patient overall required distant supervision for activities. Patient left in gym at end of session for next physical therapy session.   Precautions:  Precautions Precautions: Fall Precaution Comments: right colostomy, trach Restrictions Weight Bearing Restrictions: No  See FIM for current functional status  Therapy/Group: Individual Therapy  Lynann Demetrius 01/23/2013, 2:39 PM

## 2013-01-23 NOTE — Consult Note (Addendum)
Ostomy follow-up: Pt and sister state pouch was changed early this AM.  Supplies at bedside for staff use.  Pt familiar with pouching routines.  Recommend home health assistance with pouching after discharge.  Placed on [facility] discharge program. Stoma red and viable when visualized through pouch, flush with skin level.  Large amt liquid brown-green stool in pouch, requires frequent emptying.  Pouches need to be changed 3 times a week to avoid leakage R/T close proximity to open abd wound which is difficult to maintain pouch seal. Cammie Mcgee MSN, RN, CWOCN, CWCN-AP, CNS [phone]

## 2013-01-23 NOTE — Progress Notes (Signed)
Speech Language Pathology Daily Session Note  Patient Details  Name: Renee Nicholson MRN: [id number] Date of Birth: Jun 24, 1966  Today's Date: 01/23/2013 Time: 4098-1191 Time Calculation (min): 30 min  Short Term Goals: Week 1: SLP Short Term Goal 1 (Week 1): Patient will perform pharyngeal strengthening exercises with min A verbal cues, in order to decrease aspiration risk.  SLP Short Term Goal 1 - Progress (Week 1): Progressing toward goal SLP Short Term Goal 2 (Week 1): Patient will be able to produce voice with PMV with supervision and cues, in order to verbally express wants/needs to family/staff.  SLP Short Term Goal 2 - Progress (Week 1): Met SLP Short Term Goal 3 (Week 1): Patient will consume Dys.2 textures and thin liquids with supervision level verbal cues to utilize safe swallow compensatory strategies. SLP Short Term Goal 3 - Progress (Week 1): Progressing toward goal  Skilled Therapeutic Interventions: Treatment focus on speech and cognitive goals. SLP facilitated session by providing supervision verbal cues for utilization of applying pressure to her stoma to redirect air to her upper airway. Pt also required supervision verbal cues for utilization of increased vocal intensity at the phrase level. Pt consumed thin liquids without overt s/s of aspiration and consumed ~25% of meal. Also engaged pt in functional conversation in choosing meals to increase PO intake, however, pt's choices are limited due to dietary restrictions and food preferences (vegetarian).    FIM:  Comprehension Comprehension Mode: Auditory Comprehension: 5-Understands basic 90% of the time/requires cueing < 10% of the time Expression Expression Mode: Verbal Expression: 5-Expresses basic 90% of the time/requires cueing < 10% of the time. Social Interaction Social Interaction: 5-Interacts appropriately 90% of the time - Needs monitoring or encouragement for participation or interaction. Problem Solving Problem  Solving: 5-Solves basic 90% of the time/requires cueing < 10% of the time Memory Memory: 5-Recognizes or recalls 90% of the time/requires cueing < 10% of the time  Pain Pain Assessment Pain Assessment: No/denies pain Pain Score: 0-No pain  Therapy/Group: Individual Therapy  Linsey Arteaga 01/23/2013, 4:32 PM

## 2013-01-23 NOTE — Progress Notes (Signed)
Physical Therapy Session Note  Patient Details  Name: Renee Nicholson MRN: [id number] Date of Birth: [birth year]  Today's Date: 01/23/2013 Time: 0930-1027 Time Calculation (min): 57 min  Short Term Goals: Week 1:  PT Short Term Goal 1 (Week 1): Bed mobility supervision bed flat no rails.   PT Short Term Goal 2 (Week 1): Transfers bed<-> WC supervision with LRAD.   PT Short Term Goal 3 (Week 1): Gait with RW supervision >150' PT Short Term Goal 4 (Week 1): Berg Balance>45/56 to demonstrate that pt may not need assistive device for balance at discharge.   PT Short Term Goal 5 (Week 1): Ambulate without assistive device with min assist >150' before needing a rest break.    Skilled Therapeutic Interventions/Progress Updates:   Session focused on dynamic gait and balance activities including walking while throwing a ball up in the air/bouncing ball, retro gait, side stepping, navigating obstacle course, step ups with tall step without rails (min A), and direction changes with gait. Overall close S with all challenges without AD - pt still reports feeling more confident with cane though. Reviewed HEP for [place] with decreasing UE support to fingertips or 1 hand only to increase challenge with exercies; seated rest breaks needed due to fatigue. Reviewed floor transfer and what to do in case of emergency - pt did not return demonstrate due to abdominal restrictions but verbalized understanding of appropriate technique.  Therapy Documentation Precautions:  Precautions Precautions: Fall Precaution Comments: right colostomy, trach Restrictions Weight Bearing Restrictions: No Pain:  Denies pain. Locomotion : Ambulation Ambulation/Gait Assistance: 5: Supervision   See FIM for current functional status  Therapy/Group: Individual Therapy  Karolee Stamps [facility] 01/23/2013, 12:11 PM

## 2013-01-23 NOTE — Progress Notes (Signed)
NUTRITION FOLLOW UP  DOCUMENTATION CODES  Per approved criteria   -Severe malnutrition in the context of acute illness or injury    Intervention:   1. Continue 48-hour calorie count per MD discretion. Yesterday's intake:  885 kcal (71% of minimum estimated needs)  25 grams protein (45% of minimum estimated needs) 2. Continue Resource Breeze and Ensure Pudding BID per family request 3. RD to continue to follow nutrition care plan  Nutrition Dx:   Inadequate oral intake now related to poor appetite AEB poor meal completion.  Goal:   Intake to meet >90% of estimated nutrition needs.  Monitor:   weight trends, lab trends, I/O's, PO intake, supplement tolerance  Assessment:   Admitted with SBO likely 2/2 post-op adhesions. Pt is is status post laparoscopic total abdominal colectomy, ileal pouch anal anastomosis, and diverting ileostomy. Pt has had to be admitted several times for small bowel obstruction. Pt with rectal CA T3 N1 s/p chemoradiation.   BSE completed by SLP 4/29 with recommendations for Dysphagia 2 diet with thin liquids. TF and IVF discontinued by MD this morning; initiation of megace.   Working towards d/c on 5/6.  Day 1 Calorie Count Results:  Breakfast: 200 kcal, 6 grams protein Lunch: 150 kcal, 1 gram protein Dinner: 225 kcal, 7 grams protein Supplements: 310 kcal, 11 grams protein  Total intake: 885 kcal (71% of minimum estimated needs)  25 grams protein (45% of minimum estimated needs)  Height: Ht Readings from Last 1 Encounters:  01/04/13 4\' 4"  (1.321 m)    Weight Status:   Wt Readings from Last 1 Encounters:  01/23/13 73 lb 13.7 oz (33.5 kg)    Re-estimated needs:  Kcal: 1250 - 1400 Protein: 55 - 75 grams Fluid: maintain positive balance  Skin: surgical abdominal incisions  Diet Order: Dysphagia 2 with thins   Intake/Output Summary (Last 24 hours) at 01/23/13 1015 Last data filed at 01/23/13 0606  Gross per 24 hour  Intake    600 ml   Output   1100 ml  Net   -500 ml    Last BM: 1.15 liters via ostomy yesterday  Labs:   Recent Labs Lab 01/19/13 0705 01/20/13 0615  NA 135 136  K 4.0 3.9  CL 99 99  CO2 29 29  BUN 8 10  CREATININE 0.50 0.48*  CALCIUM 8.7 8.8  GLUCOSE 124* 124*    CBG (last 3)   Recent Labs  01/22/13 1644 01/22/13 2111 01/23/13 0738  GLUCAP 144* 107* 140*    Scheduled Meds: . antiseptic oral rinse  15 mL Mouth Rinse q12n4p  . enoxaparin (LOVENOX) injection  30 mg Subcutaneous Q24H  . feeding supplement  1 Container Oral BID BM  . feeding supplement  1 Container Oral BID BM  . free water  100 mL Per Tube BID  . insulin aspart  0-5 Units Subcutaneous QHS  . insulin aspart  0-9 Units Subcutaneous TID WC  . loperamide  2 mg Oral Q6H  . LORazepam  0.5 mg Per Tube QHS  . megestrol  400 mg Oral BID  . multivitamin  5 mL Oral Daily  . Opium  4 drop Per Tube QID  . pantoprazole sodium  40 mg Per Tube BID  . potassium chloride  20 mEq Oral Daily  . saccharomyces boulardii  250 mg Oral BID  . simethicone  40 mg Oral QID  . thiamine  100 mg Per Tube Daily    Continuous Infusions: none  Jarold Motto MS, RD, LDN Pager: [phone] After-hours pager: [phone]

## 2013-01-24 ENCOUNTER — Inpatient Hospital Stay (HOSPITAL_COMMUNITY): Payer: BC Managed Care – PPO | Admitting: Occupational Therapy

## 2013-01-24 ENCOUNTER — Inpatient Hospital Stay (HOSPITAL_COMMUNITY): Payer: BC Managed Care – PPO | Admitting: Speech Pathology

## 2013-01-24 ENCOUNTER — Inpatient Hospital Stay (HOSPITAL_COMMUNITY): Payer: BC Managed Care – PPO

## 2013-01-24 LAB — BASIC METABOLIC PANEL
BUN: 7 mg/dL (ref 6–23)
CO2: 30 mEq/L (ref 19–32)
Chloride: 100 mEq/L (ref 96–112)
Creatinine, Ser: 0.46 mg/dL — ABNORMAL LOW (ref 0.50–1.10)
GFR calc Af Amer: >90 mL/min (ref >90)
Glucose, Bld: 95 mg/dL (ref 70–99)

## 2013-01-24 LAB — GLUCOSE, CAPILLARY
Glucose-Capillary: 116 mg/dL — ABNORMAL HIGH (ref 70–99)
Glucose-Capillary: 92 mg/dL (ref 70–99)
Glucose-Capillary: 111 mg/dL — ABNORMAL HIGH (ref 70–99)

## 2013-01-24 MED ORDER — FLUCONAZOLE 40 MG/ML PO SUSR
200.0000 mg | Freq: Once | ORAL | Status: AC
  Administered 2013-01-24: 200 mg via ORAL
  Filled 2013-01-24: qty 5

## 2013-01-24 MED ORDER — PHENOL 1.4 % MT LIQD
1.0000 | OROMUCOSAL | Status: DC | PRN
  Filled 2013-01-24: qty 177

## 2013-01-24 MED ORDER — FLUCONAZOLE 40 MG/ML PO SUSR
100.0000 mg | Freq: Every day | ORAL | Status: DC
  Administered 2013-01-25 – 2013-01-28 (×4): 100 mg via ORAL
  Filled 2013-01-24 (×6): qty 2.5

## 2013-01-24 MED ORDER — POTASSIUM CHLORIDE 20 MEQ/15ML (10%) PO LIQD
20.0000 meq | Freq: Two times a day (BID) | ORAL | Status: DC
  Administered 2013-01-24 – 2013-01-27 (×7): 20 meq via ORAL
  Filled 2013-01-24 (×10): qty 15

## 2013-01-24 MED ORDER — FERROUS SULFATE 300 (60 FE) MG/5ML PO SYRP
300.0000 mg | ORAL_SOLUTION | Freq: Every day | ORAL | Status: DC
  Administered 2013-01-24 – 2013-01-28 (×5): 300 mg via ORAL
  Filled 2013-01-24 (×6): qty 5

## 2013-01-24 MED ORDER — HYDROCERIN EX CREA
TOPICAL_CREAM | Freq: Two times a day (BID) | CUTANEOUS | Status: DC
  Administered 2013-01-24 – 2013-01-26 (×6): via TOPICAL
  Filled 2013-01-24: qty 113

## 2013-01-24 MED ORDER — NYSTATIN 100000 UNIT/ML MT SUSP
5.0000 mL | Freq: Four times a day (QID) | OROMUCOSAL | Status: DC
  Administered 2013-01-24 – 2013-01-28 (×17): 500000 [IU] via ORAL
  Filled 2013-01-24 (×21): qty 5

## 2013-01-24 NOTE — Consult Note (Addendum)
Ostomy follow-up: Pouch intact with good seal, applied yesterday morning. Stoma red and viable, flush with skin level when visualized through pouch.  Mod amt brown liquid stool in pouch. Supplies at bedside for staff use.  Pt and sister have good understanding of pouching routines, emptying, and application. Cammie Mcgee MSN, RN, CWOCN, [place], CNS [phone]

## 2013-01-24 NOTE — Progress Notes (Addendum)
NUTRITION FOLLOW UP  DOCUMENTATION CODES  Per approved criteria   -Severe malnutrition in the context of acute illness or injury    Intervention:   1. Discontinue 48-hour calorie count per MD discretion. Average intake is 81% of calorie needs and 60% of protein needs. 2. Continue Resource Breeze and Ensure Pudding BID per family request. 3. Continue to follow hydration status - adjust free water as needed 4. RD to continue to follow nutrition care plan.  Nutrition Dx:   Inadequate oral intake now related to poor appetite AEB poor meal completion. Improving.  Goal:   Intake to meet >90% of estimated nutrition needs.  Monitor:   weight trends, lab trends, I/O's, PO intake, supplement tolerance  Assessment:   Admitted with SBO likely 2/2 post-op adhesions. Pt is is status post laparoscopic total abdominal colectomy, ileal pouch anal anastomosis, and diverting ileostomy. Pt has had to be admitted several times for small bowel obstruction. Pt with rectal CA T3 N1 s/p chemoradiation.   BSE completed by SLP 4/29 with recommendations for Dysphagia 2 diet with thin liquids. TF and IVF discontinued by MD 5/1; initiation of megace.   Working towards d/c on 5/6.  Day 1 Calorie Count Results:  Total intake: 885 kcal (71% of minimum estimated needs)  25 grams protein (45% of minimum estimated needs)  Day 2 Calorie Count Results:  Breakfast: 100 kcal, 6 grams protein Lunch: 100 kcal, 2 grams protein Dinner: 150 kcal, 7 grams protein Supplements: 790 kcal, 27 grams protein  Total intake: 1140 kcal (91% of minimum estimated needs)  42 grams protein (76% of minimum estimated needs)  Pt is progressing well with her appetite and supplements. Encouraged family to continue to maximize intake of supplements as able. Family very appreciative and encouraging.  Pt with increasing sodium, from 136 to 138. Continues free water of 100 ml BID. RD to continue to monitor.  Height: Ht Readings from  Last 1 Encounters:  01/04/13 4\' 4"  (1.321 m)    Weight Status:   Wt Readings from Last 1 Encounters:  01/24/13 68 lb 12.5 oz (31.2 kg)    Re-estimated needs:  Kcal: 1250 - 1400 Protein: 55 - 75 grams Fluid: maintain positive balance  Skin: surgical abdominal incisions  Diet Order: Dysphagia 2 with thins   Intake/Output Summary (Last 24 hours) at 01/24/13 0951 Last data filed at 01/24/13 0600  Gross per 24 hour  Intake    480 ml  Output   1185 ml  Net   -705 ml    Last BM: 1.08 liters via ostomy yesterday  Labs:   Recent Labs Lab 01/19/13 0705 01/20/13 0615 01/24/13 0550  NA 135 136 138  K 4.0 3.9 3.3*  CL 99 99 100  CO2 29 29 30   BUN 8 10 7   CREATININE 0.50 0.48* 0.46*  CALCIUM 8.7 8.8 8.5  GLUCOSE 124* 124* 95    CBG (last 3)   Recent Labs  01/23/13 1656 01/23/13 2055 01/24/13 0723  GLUCAP 127* 100* 116*    Scheduled Meds: . antiseptic oral rinse  15 mL Mouth Rinse q12n4p  . enoxaparin (LOVENOX) injection  30 mg Subcutaneous Q24H  . feeding supplement  1 Container Oral BID BM  . feeding supplement  1 Container Oral BID BM  . ferrous sulfate  300 mg Oral Q breakfast  . [START ON 01/25/2013] fluconazole  100 mg Oral Daily  . free water  100 mL Per Tube BID  . hydrocerin   Topical BID  .  insulin aspart  0-5 Units Subcutaneous QHS  . insulin aspart  0-9 Units Subcutaneous TID WC  . loperamide  2 mg Oral Q6H  . LORazepam  0.5 mg Per Tube QHS  . megestrol  400 mg Oral BID  . multivitamin  5 mL Oral Daily  . nystatin  5 mL Oral QID  . Opium  4 drop Per Tube QID  . pantoprazole sodium  40 mg Per Tube BID  . potassium chloride  20 mEq Oral BID  . saccharomyces boulardii  250 mg Oral BID  . simethicone  40 mg Oral QID  . sodium chloride  10-40 mL Intracatheter Q12H  . thiamine  100 mg Per Tube Daily    Continuous Infusions: none    Jarold Motto MS, RD, LDN Pager: [phone] After-hours pager: [phone]

## 2013-01-24 NOTE — Progress Notes (Signed)
Physical Therapy Session Note  Patient Details  Name: Renee Nicholson MRN: [id number] Date of Birth: 1966/06/19  Today's Date: 01/24/2013 Time: 0865-7846 Time Calculation (min): 50 min  Short Term Goals: Week 1:  PT Short Term Goal 1 (Week 1): Bed mobility supervision bed flat no rails.   PT Short Term Goal 2 (Week 1): Transfers bed<-> WC supervision with LRAD.   PT Short Term Goal 3 (Week 1): Gait with RW supervision >150' PT Short Term Goal 4 (Week 1): Berg Balance>45/56 to demonstrate that pt may not need assistive device for balance at discharge.   PT Short Term Goal 5 (Week 1): Ambulate without assistive device with min assist >150' before needing a rest break.    Skilled Therapeutic Interventions/Progress Updates:    Pt missed first 10 min of skilled PT due to late medication, changing colostomy and using bathroom. Gait in controlled environment overall S for general endurance and strengthening. No episodes of LOB but slow gait speed noted. Administered Dynamic gait index (see details below). Nustep for overall endurance and strengthening x 10 min on level 5. Dynamic gait on unit with dual task and adding cognitive components to challenge balance to throw and catch ball with forward gait, retro gait, and side stepping to R and L with overall S. No AD recommended at this time and discussed this with social work and sister in Social worker.   Therapy Documentation Precautions:  Precautions Precautions: Fall Precaution Comments: right colostomy, trach Restrictions Weight Bearing Restrictions: No General: Amount of Missed PT Time (min): 10 Minutes Missed Time Reason: Nursing care Pain:  Denies pain.      Balance: Dynamic Gait Index Level Surface: Mild Impairment (slow gait speed, mild deviations) Change in Gait Speed: Normal Gait with Horizontal Head Turns: Mild Impairment (speed slows with head turns) Gait with Vertical Head Turns: Normal Gait and Pivot Turn: Normal Step Over Obstacle:  Mild Impairment Step Around Obstacles: Normal Steps: Mild Impairment Total Score: 20  See FIM for current functional status  Therapy/Group: Individual Therapy  Karolee Stamps [facility] 01/24/2013, 10:04 AM

## 2013-01-24 NOTE — Progress Notes (Addendum)
Speech Language Pathology Daily Session Note  Patient Details  Name: Renee Nicholson MRN: [id number] Date of Birth: Sep 02, 1966  Today's Date: 01/24/2013 Time: 1130-1215 Time Calculation (min): 45 min  Short Term Goals: Week 1: SLP Short Term Goal 1 (Week 1): Patient will perform pharyngeal strengthening exercises with min A verbal cues, in order to decrease aspiration risk.  SLP Short Term Goal 1 - Progress (Week 1): Progressing toward goal SLP Short Term Goal 2 (Week 1): Patient will be able to produce voice with PMV with supervision and cues, in order to verbally express wants/needs to family/staff.  SLP Short Term Goal 2 - Progress (Week 1): Met SLP Short Term Goal 3 (Week 1): Patient will consume Dys.2 textures and thin liquids with supervision level verbal cues to utilize safe swallow compensatory strategies. SLP Short Term Goal 3 - Progress (Week 1): Progressing toward goal  Skilled Therapeutic Interventions: Treatment focus on speech and dysphagia goals.  Pt consumed current diet of Dys. 2 textures with thin liquids without overt s/s of aspiration and was overall Mod I for utilization of swallowing compensatory strategies.  Pt also consumed trials of regular textures ( saltines and graham crackers) and demonstrated efficient mastication without overt s/s of aspiration.  Recommend upgrade to Dys. 3 textures with thin liquids with full supervision. FIM:  Comprehension Comprehension Mode: Auditory Comprehension: 5-Understands basic 90% of the time/requires cueing < 10% of the time Expression Expression Mode: Verbal Expression: 5-Expresses basic 90% of the time/requires cueing < 10% of the time. Social Interaction Social Interaction: 5-Interacts appropriately 90% of the time - Needs monitoring or encouragement for participation or interaction. Problem Solving Problem Solving: 5-Solves basic 90% of the time/requires cueing < 10% of the time Memory Memory: 5-Recognizes or recalls 90% of the  time/requires cueing < 10% of the time FIM - Eating Eating Activity: 6: Modified consistency diet: (comment)  Pain Pain Assessment Pain Assessment: No/denies pain  Therapy/Group: Individual Therapy  Ting Cage 01/24/2013, 3:41 PM

## 2013-01-24 NOTE — Progress Notes (Signed)
Subjective/Complaints: Complains of sore throat. Trying to eat more. Has appetite.  Objective: Vital Signs: Blood pressure 111/65, pulse 73, temperature 98 F (36.7 C), temperature source Oral, resp. rate 16, weight 31.2 kg (68 lb 12.5 oz), last menstrual period 02/20/2012, SpO2 100.00%. No results found.  Recent Labs  01/23/13 0520  WBC 6.3  HGB 8.9*  HCT 26.2*  PLT 288    Recent Labs  01/24/13 0550  NA 138  K 3.3*  CL 100  GLUCOSE 95  BUN 7  CREATININE 0.46*  CALCIUM 8.5   CBG (last 3)   Recent Labs  01/23/13 1656 01/23/13 2055 01/24/13 0723  GLUCAP 127* 100* 116*    Wt Readings from Last 3 Encounters:  01/24/13 31.2 kg (68 lb 12.5 oz)  01/17/13 35.925 kg (79 lb 3.2 oz)  01/17/13 35.925 kg (79 lb 3.2 oz)    Physical Exam:  Vitals reviewed.  Constitutional:   NG tube in place, no distress, frail appearing/petite. Tongue, pharynx with white coating.  Eyes: EOM are normal.  Neck:  Trach out/ stoma closing. Dressing in place. Sl dysphonia Cardiovascular: Normal rate and regular rhythm.  Pulmonary/Chest: Effort normal and breath sounds normal. No respiratory distress. She has no wheezes. No cough. Occasional upper airways sounds. Abdominal: Soft. Bowel sounds are normal. Appropriately tender Neurological: She is alert. She has normal reflexes. No cranial nerve deficit. She exhibits normal muscle tone.  Makes good eye contact with examiner.generally pleasant and alert. Follows all simple commands. Strength remains 3+ to 4/5 UE's. LE's still 2- to 2/5 at HF prox to 3+/4 distally. No sensory deficits. DTR's 1+  Skin:  Surgical site dressed /colostomy in place with some gas. Close proximity to healing abdominal wound which continues to granulate in.  Psychiatric:  Quiet, flat    Assessment/Plan: 1. Functional deficits secondary to severe deconditioning after SBO, exlap, lysis of adhesions, ileostomy revision which require 3+ hours per day of interdisciplinary  therapy in a comprehensive inpatient rehab setting. Physiatrist is providing close team supervision and 24 hour management of active medical problems listed below. Physiatrist and rehab team continue to assess barriers to discharge/monitor patient progress toward functional and medical goals. FIM: FIM - Bathing Bathing Steps Patient Completed: Chest;Right Arm;Left Arm;Abdomen;Front perineal area;Buttocks;Right upper leg;Left upper leg;Right lower leg (including foot);Left lower leg (including foot) Bathing: 5: Supervision: Safety issues/verbal cues  FIM - Upper Body Dressing/Undressing Upper body dressing/undressing: 0: Wears gown/pajamas-no public clothing FIM - Lower Body Dressing/Undressing Lower body dressing/undressing: 0: Wears gown/pajamas-no public clothing  FIM - Toileting Toileting steps completed by patient: Adjust clothing prior to toileting;Performs perineal hygiene;Adjust clothing after toileting Toileting: 5: Supervision: Safety issues/verbal cues  FIM - Archivist Transfers Assistive Devices: Elevated toilet seat;Grab bars;Walker Toilet Transfers: 4-To toilet/BSC: Min A (steadying Pt. > 75%);4-From toilet/BSC: Min A (steadying Pt. > 75%) (steady assist)  FIM - Banker Devices: Teacher, music: 5: Chair or W/C > Bed: Supervision (verbal cues/safety issues);5: Bed > Chair or W/C: Supervision (verbal cues/safety issues)  FIM - Locomotion: Wheelchair Locomotion: Wheelchair: 0: Activity did not occur FIM - Locomotion: Ambulation Locomotion: Ambulation Assistive Devices: Emergency planning/management officer Ambulation/Gait Assistance: 5: Supervision Locomotion: Ambulation: 5: Travels 150 ft or more with supervision/safety issues  Comprehension Comprehension Mode: Auditory Comprehension: 5-Understands basic 90% of the time/requires cueing < 10% of the time  Expression Expression Mode: Verbal Expression Assistive Devices: 6-Other  (Comment) Expression: 5-Expresses basic 90% of the time/requires cueing < 10% of the time.  Social Interaction Social Interaction: 5-Interacts appropriately 90% of the time - Needs monitoring or encouragement for participation or interaction.  Problem Solving Problem Solving Mode: Not assessed Problem Solving: 5-Solves basic 90% of the time/requires cueing < 10% of the time  Memory Memory: 5-Recognizes or recalls 90% of the time/requires cueing < 10% of the time  Medical Problem List and Plan:  1. Deconditioning after small bowel obstruction. Status post exploratory lap, lysis of adhesions ileostomy revision-postop respiratory failure  2. DVT Prophylaxis/Anticoagulation: Subcutaneous Lovenox. Monitor platelet counts any signs of bleeding  3. Mood: Ativan 0.5 mg each bedtime. Provide emotional support. Family appears quite positive and supportive as well.  4. Neuropsych: This patient is capable of making decisions on his/her own behalf.  5. Dysphagia. Currently n.p.o. Continue nasogastric tube feeds and follow per speech therapy.  6. Respiratory failure. 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 -trach out -occlusive dressing 7. History of rectal cancer. No plan for ongoing radiation therapy at this time  8. Ileostomy: immodium, simethicone -probiotic, opium per surgery -meticulous wound/ostomy mgt  -on D2 thins -holding HS formula to increase app, hold IVF to see how she sustains on her own -closely follow labs -megace initiated to increase appetite -ate more yesterday, pushing supps as well -replace K+ 9. Sore throat-  -candida esophagitis  -initiated diflucan through ngt and mouth wash as well  -cepacol spray for comfort  LOS (Days) 7 A FACE TO FACE EVALUATION WAS PERFORMED  SWARTZ,ZACHARY T 01/24/2013 8:14 AM

## 2013-01-24 NOTE — Progress Notes (Signed)
Occupational Therapy Session Note  Patient Details  Name: Renee Nicholson MRN: [id number] Date of Birth: [birth year]  Today's Date: 01/24/2013 Time: 0800-0855 Time Calculation (min): 55 min  Short Term Goals = Long Term Goals  Skilled Therapeutic Interventions/Progress Updates:  Patient found supine in bed. Patient ambulated -> sink side for ADL; patient gathered all necessary items for ADL. Patient then performed UB/LB bathing & UB/LB dressing (even donning shoes) at supervision level in sit<>stand position. Patient able to perform functional ambulation and bathing & dressing without any assistive devices. After ADL, patient ambulated -> ADL apartment to soak hands in water & imitation vanilla to help with dry skin. Therapist gave patient peach Boost drink and patient ambulated back to room. Patient left seated in recliner with call bell & phone within reach; sister present in room. Notified RN to check NG tube.   Precautions:  Precautions Precautions: Fall Precaution Comments: right colostomy, trach Restrictions Weight Bearing Restrictions: No  See FIM for current functional status  Therapy/Group: Individual Therapy  Rafal Archuleta 01/24/2013, 8:59 AM

## 2013-01-24 NOTE — Progress Notes (Addendum)
Physical Therapy Session Note  Patient Details  Name: Renee Nicholson MRN: [id number] Date of Birth: Jul 03, 1966  Today's Date: 01/24/2013 Time: 6295-2841 Time Calculation (min): 45 min  Short Term Goals: Week 1:  PT Short Term Goal 1 (Week 1): Bed mobility supervision bed flat no rails.   PT Short Term Goal 2 (Week 1): Transfers bed<-> WC supervision with LRAD.   PT Short Term Goal 3 (Week 1): Gait with RW supervision >150' PT Short Term Goal 4 (Week 1): Berg Balance>45/56 to demonstrate that pt may not need assistive device for balance at discharge.   PT Short Term Goal 5 (Week 1): Ambulate without assistive device with min assist >150' before needing a rest break.    Skilled Therapeutic Interventions/Progress Updates:    Session focused on community mobility outdoors on uneven surfaces (concrete, grass, mulch, pavement), navigating hills, stair negotiation, and overall distance for activity tolerance. Discussed community mobility safety, energy conservation techniques, and took 1 seated rest break as needed. Pt able to gait > 1000' with overall S in all settings; 1 LOB occurred on unit but able to self correct. Standing at SCI fit machine at end of session completed 3 min forward and 3 min backwards on random program for general UE strengthening/endurance per pt request.   Therapy Documentation Precautions:  Precautions Precautions: Fall Precaution Comments: right colostomy, trach Restrictions Weight Bearing Restrictions: No   Pain:  Denies pain.   Locomotion : Ambulation Ambulation/Gait Assistance: 5: Supervision   See FIM for current functional status  Therapy/Group: Individual Therapy  Karolee Stamps [facility] 01/24/2013, 1:58 PM

## 2013-01-25 ENCOUNTER — Inpatient Hospital Stay (HOSPITAL_COMMUNITY): Payer: BC Managed Care – PPO | Admitting: *Deleted

## 2013-01-25 DIAGNOSIS — R5381 Other malaise: Secondary | ICD-10-CM

## 2013-01-25 DIAGNOSIS — J9600 Acute respiratory failure, unspecified whether with hypoxia or hypercapnia: Secondary | ICD-10-CM

## 2013-01-25 DIAGNOSIS — K56609 Unspecified intestinal obstruction, unspecified as to partial versus complete obstruction: Secondary | ICD-10-CM

## 2013-01-25 LAB — CBC
Hemoglobin: 9.4 g/dL — ABNORMAL LOW (ref 12.0–15.0)
Platelets: 281 10*3/uL (ref 150–400)
RBC: 3.22 MIL/uL — ABNORMAL LOW (ref 3.87–5.11)
WBC: 6.4 10*3/uL (ref 4.0–10.5)

## 2013-01-25 LAB — BASIC METABOLIC PANEL
CO2: 28 mEq/L (ref 19–32)
Chloride: 100 mEq/L (ref 96–112)
Glucose, Bld: 95 mg/dL (ref 70–99)
Potassium: 3.7 mEq/L (ref 3.5–5.1)
Sodium: 138 mEq/L (ref 135–145)

## 2013-01-25 LAB — GLUCOSE, CAPILLARY
Glucose-Capillary: 91 mg/dL (ref 70–99)
Glucose-Capillary: 119 mg/dL — ABNORMAL HIGH (ref 70–99)
Glucose-Capillary: 111 mg/dL — ABNORMAL HIGH (ref 70–99)

## 2013-01-25 NOTE — Progress Notes (Signed)
Patient ID: Renee Nicholson, female   DOB: 07/01/1966, 47 y.o.   MRN: [id number] Subjective/Complaints:  No c/o  Objective: Vital Signs: Blood pressure 96/63, pulse 79, temperature 98.1 F (36.7 C), temperature source Oral, resp. rate 16, weight 30.9 kg (68 lb 2 oz), last menstrual period 02/20/2012, SpO2 98.00%. No results found.  Recent Labs  01/23/13 0520 01/25/13 0550  WBC 6.3 6.4  HGB 8.9* 9.4*  HCT 26.2* 27.7*  PLT 288 281    Recent Labs  01/24/13 0550 01/25/13 0550  NA 138 138  K 3.3* 3.7  CL 100 100  GLUCOSE 95 95  BUN 7 6  CREATININE 0.46* 0.52  CALCIUM 8.5 8.8   CBG (last 3)   Recent Labs  01/24/13 0723 01/24/13 1128 01/24/13 2309  GLUCAP 116* 92 111*    Wt Readings from Last 3 Encounters:  01/25/13 30.9 kg (68 lb 2 oz)  01/17/13 35.925 kg (79 lb 3.2 oz)  01/17/13 35.925 kg (79 lb 3.2 oz)    Physical Exam:  Vitals reviewed.  Constitutional:   NG tube in place, no distress, frail appearing/petite. Tongue, pharynx with white coating.  Eyes: EOM are normal.  Neck:  Trach out/ stoma closing. Dressing in place. Sl dysphonia Cardiovascular: Normal rate and regular rhythm.  Pulmonary/Chest: Effort normal and breath sounds normal. No respiratory distress. She has no wheezes. No cough. Occasional upper airways sounds. Abdominal: Soft. Bowel sounds are normal. Appropriately tender Neurological: She is alert. She has normal reflexes. No cranial nerve deficit. She exhibits normal muscle tone.  Makes good eye contact with examiner.generally pleasant and alert. Follows all simple commands. Strength remains 3+ to 4/5 UE's. LE's still 2- to 2/5 at HF prox to 3+/4 distally. No sensory deficits. DTR's 1+  Skin:  Surgical site dressed /colostomy in place with some gas. Close proximity to healing abdominal wound which continues to granulate in.  Psychiatric:  Quiet, flat    Assessment/Plan: 1. Functional deficits secondary to severe deconditioning after SBO, exlap,  lysis of adhesions, ileostomy revision which require 3+ hours per day of interdisciplinary therapy in a comprehensive inpatient rehab setting. Physiatrist is providing close team supervision and 24 hour management of active medical problems listed below. Physiatrist and rehab team continue to assess barriers to discharge/monitor patient progress toward functional and medical goals. FIM: FIM - Bathing Bathing Steps Patient Completed: Chest;Right Arm;Left Arm;Abdomen;Front perineal area;Buttocks;Right upper leg;Left upper leg;Right lower leg (including foot);Left lower leg (including foot) Bathing: 5: Supervision: Safety issues/verbal cues  FIM - Upper Body Dressing/Undressing Upper body dressing/undressing steps patient completed: Thread/unthread right sleeve of pullover shirt/dresss;Thread/unthread left sleeve of pullover shirt/dress;Put head through opening of pull over shirt/dress;Pull shirt over trunk Upper body dressing/undressing: 5: Supervision: Safety issues/verbal cues FIM - Lower Body Dressing/Undressing Lower body dressing/undressing steps patient completed: Thread/unthread right underwear leg;Thread/unthread left underwear leg;Pull underwear up/down;Thread/unthread right pants leg;Thread/unthread left pants leg;Pull pants up/down;Don/Doff right sock;Don/Doff left sock;Don/Doff right shoe;Don/Doff left shoe;Fasten/unfasten right shoe;Fasten/unfasten left shoe Lower body dressing/undressing: 5: Supervision: Safety issues/verbal cues  FIM - Toileting Toileting steps completed by patient: Adjust clothing prior to toileting;Performs perineal hygiene;Adjust clothing after toileting Toileting: 5: Supervision: Safety issues/verbal cues  FIM - Archivist Transfers Assistive Devices: Elevated toilet seat;Grab bars;Walker Toilet Transfers: 4-To toilet/BSC: Min A (steadying Pt. > 75%);4-From toilet/BSC: Min A (steadying Pt. > 75%) (steady assist)  FIM - Physiological scientist Devices: Teacher, music: 6: Sit > Supine: No assist;5: Chair or W/C > Bed: Supervision (verbal  cues/safety issues)  FIM - Locomotion: Wheelchair Locomotion: Wheelchair: 0: Activity did not occur FIM - Locomotion: Ambulation Locomotion: Ambulation Assistive Devices: Emergency planning/management officer Ambulation/Gait Assistance: 5: Supervision Locomotion: Ambulation: 5: Travels 150 ft or more with supervision/safety issues  Comprehension Comprehension Mode: Auditory Comprehension: 5-Follows basic conversation/direction: With extra time/assistive device  Expression Expression Mode: Verbal Expression Assistive Devices: 6-Other (Comment) Expression: 5-Expresses basic 90% of the time/requires cueing < 10% of the time.  Social Interaction Social Interaction: 5-Interacts appropriately 90% of the time - Needs monitoring or encouragement for participation or interaction.  Problem Solving Problem Solving Mode: Not assessed Problem Solving: 5-Solves basic 90% of the time/requires cueing < 10% of the time  Memory Memory: 5-Recognizes or recalls 90% of the time/requires cueing < 10% of the time  Medical Problem List and Plan:  1. Deconditioning after small bowel obstruction. Status post exploratory lap, lysis of adhesions ileostomy revision-postop respiratory failure  2. DVT Prophylaxis/Anticoagulation: Subcutaneous Lovenox. Monitor platelet counts any signs of bleeding  3. Mood: Ativan 0.5 mg each bedtime. Provide emotional support. Family appears quite positive and supportive as well.  4. Neuropsych: This patient is capable of making decisions on his/her own behalf.  5. Dysphagia. Currently n.p.o. Continue nasogastric tube feeds and follow per speech therapy.  6. Respiratory failure. 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 -trach out -occlusive dressing 7. History of rectal cancer. No plan for ongoing radiation therapy at this time  8. Ileostomy: immodium,  simethicone -probiotic, opium per surgery -meticulous wound/ostomy mgt  -on D2 thins -holding HS formula to increase app, hold IVF to see how she sustains on her own -closely follow labs -megace initiated to increase appetite -ate more yesterday, pushing supps as well -replace K+ 9. Sore throat-  -candida esophagitis  -initiated diflucan through ngt and mouth wash as well  -cepacol spray for comfort  LOS (Days) 8 A FACE TO FACE EVALUATION WAS PERFORMED  Erick Colace 01/25/2013 7:39 AM

## 2013-01-25 NOTE — Progress Notes (Signed)
Speech Language Pathology Daily Session Note  Patient Details  Name: Davia Smyre MRN: [id number] Date of Birth: 06/13/1966  Today's Date: 01/25/2013 Time: 0745-0800 Time Calculation (min): 15 min  Short Term Goals: Week 1: SLP Short Term Goal 1 (Week 1): Patient will perform pharyngeal strengthening exercises with min A verbal cues, in order to decrease aspiration risk.  SLP Short Term Goal 1 - Progress (Week 1): Progressing toward goal SLP Short Term Goal 2 (Week 1): Patient will be able to produce voice with PMV with supervision and cues, in order to verbally express wants/needs to family/staff.  SLP Short Term Goal 2 - Progress (Week 1): Met SLP Short Term Goal 3 (Week 1): Patient will consume Dys.2 textures and thin liquids with supervision level verbal cues to utilize safe swallow compensatory strategies. SLP Short Term Goal 3 - Progress (Week 1): Progressing toward goal  Skilled Therapeutic Interventions:     FIM:  Comprehension Comprehension Mode: Auditory Comprehension: 5-Follows basic conversation/direction: With extra time/assistive device Expression Expression Mode: Verbal Expression: 5-Expresses basic 90% of the time/requires cueing < 10% of the time. Social Interaction Social Interaction: 5-Interacts appropriately 90% of the time - Needs monitoring or encouragement for participation or interaction. Problem Solving Problem Solving: 5-Solves basic 90% of the time/requires cueing < 10% of the time Memory Memory: 5-Recognizes or recalls 90% of the time/requires cueing < 10% of the time FIM - Eating Eating Activity: 6: Swallowing techniques: self-managed  Pain Pain Assessment Pain Assessment: No/denies pain  Therapy/Group: Individual Therapy  Chinonso Linker 01/25/2013, 12:19 PM

## 2013-01-25 NOTE — Progress Notes (Signed)
Physical Therapy Note  Patient Details  Name: Renee Nicholson MRN: [id number] Date of Birth: 08/28/1966 Today's Date: 01/25/2013  1000-1055 (55 minutes) group Pain: no reported pain Pt participated in PT group session focused on gait training/safety/endurance; Nustep Level 5 x 10 minutes for general strengthening/endurance; gait 150 feet close SBA; gait with min /mod resistance at hips 150 feet; Standing balance using rocker board for ankle control forward/back and side to side close SBA with no loss of balance.    Ryelle Ruvalcaba,JIM 01/25/2013, 11:35 AM

## 2013-01-26 ENCOUNTER — Inpatient Hospital Stay (HOSPITAL_COMMUNITY): Payer: BC Managed Care – PPO | Admitting: *Deleted

## 2013-01-26 LAB — GLUCOSE, CAPILLARY
Glucose-Capillary: 97 mg/dL (ref 70–99)
Glucose-Capillary: 137 mg/dL — ABNORMAL HIGH (ref 70–99)

## 2013-01-26 NOTE — Progress Notes (Signed)
Patient ID: Renee Nicholson, female   DOB: Feb 05, 1966, 47 y.o.   MRN: [id number] Subjective/Complaints:  No c/o Denies breathing problems, no abd pain or vomiting Objective: Vital Signs: Blood pressure 110/67, pulse 61, temperature 97.4 F (36.3 C), temperature source Oral, resp. rate 18, weight 30.9 kg (68 lb 2 oz), last menstrual period 02/20/2012, SpO2 98.00%. No results found.  Recent Labs  01/25/13 0550  WBC 6.4  HGB 9.4*  HCT 27.7*  PLT 281    Recent Labs  01/24/13 0550 01/25/13 0550  NA 138 138  K 3.3* 3.7  CL 100 100  GLUCOSE 95 95  BUN 7 6  CREATININE 0.46* 0.52  CALCIUM 8.5 8.8   CBG (last 3)   Recent Labs  01/25/13 1605 01/25/13 2216 01/26/13 0719  GLUCAP 119* 111* 97    Wt Readings from Last 3 Encounters:  01/25/13 30.9 kg (68 lb 2 oz)  01/17/13 35.925 kg (79 lb 3.2 oz)  01/17/13 35.925 kg (79 lb 3.2 oz)    Physical Exam:  Vitals reviewed.  Constitutional:   NG tube in place, no distress, frail appearing/petite. Tongue, pharynx with white coating.  Eyes: EOM are normal.  Neck:  Trach out/ stoma closing. Dressing in place. Sl dysphonia Cardiovascular: Normal rate and regular rhythm.  Pulmonary/Chest: Effort normal and breath sounds normal. No respiratory distress. She has no wheezes. No cough. Occasional upper airways sounds. Abdominal: Soft. Bowel sounds are normal. Appropriately tender Neurological: She is alert. She has normal reflexes. No cranial nerve deficit. She exhibits normal muscle tone.  Makes good eye contact with examiner.generally pleasant and alert. Follows all simple commands. Strength remains 3+ to 4/5 UE's. LE's still 2- to 2/5 at HF prox to 3+/4 distally. No sensory deficits. DTR's 1+  Skin:  Surgical site dressed /colostomy in place with some gas. Close proximity to healing abdominal wound which continues to granulate in.  Psychiatric:  Quiet, flat    Assessment/Plan: 1. Functional deficits secondary to severe deconditioning  after SBO, exlap, lysis of adhesions, ileostomy revision which require 3+ hours per day of interdisciplinary therapy in a comprehensive inpatient rehab setting. Physiatrist is providing close team supervision and 24 hour management of active medical problems listed below. Physiatrist and rehab team continue to assess barriers to discharge/monitor patient progress toward functional and medical goals. FIM: FIM - Bathing Bathing Steps Patient Completed: Chest;Right Arm;Left Arm;Abdomen;Front perineal area;Buttocks;Right upper leg;Left upper leg Bathing: 6: More than reasonable amount of time  FIM - Upper Body Dressing/Undressing Upper body dressing/undressing steps patient completed: Thread/unthread right sleeve of pullover shirt/dresss;Thread/unthread left sleeve of pullover shirt/dress;Put head through opening of pull over shirt/dress;Pull shirt over trunk Upper body dressing/undressing: 0: Wears gown/pajamas-no public clothing FIM - Lower Body Dressing/Undressing Lower body dressing/undressing steps patient completed: Thread/unthread right underwear leg;Thread/unthread left underwear leg;Pull underwear up/down;Thread/unthread right pants leg;Thread/unthread left pants leg;Pull pants up/down;Don/Doff right sock;Don/Doff left sock;Don/Doff right shoe;Don/Doff left shoe;Fasten/unfasten right shoe;Fasten/unfasten left shoe Lower body dressing/undressing: 0: Wears gown/pajamas-no public clothing  FIM - Toileting Toileting steps completed by patient: Performs perineal hygiene Toileting: 5: Supervision: Safety issues/verbal cues  FIM - Diplomatic Services operational officer Devices: Grab bars Toilet Transfers: 5-To toilet/BSC: Supervision (verbal cues/safety issues);5-From toilet/BSC: Supervision (verbal cues/safety issues)  FIM - Banker Devices: Teacher, music: 5: Chair or W/C > Bed: Supervision (verbal cues/safety issues);5: Bed > Chair or  W/C: Supervision (verbal cues/safety issues)  FIM - Locomotion: Wheelchair Locomotion: Wheelchair: 0: Activity did not occur FIM -  Locomotion: Ambulation Locomotion: Ambulation Assistive Devices: Emergency planning/management officer Ambulation/Gait Assistance: 5: Supervision Locomotion: Ambulation: 5: Travels 150 ft or more with supervision/safety issues  Comprehension Comprehension Mode: Auditory Comprehension: 7-Follows complex conversation/direction: With no assist  Expression Expression Mode: Verbal Expression Assistive Devices: 6-Other (Comment) Expression: 7-Expresses complex ideas: With no assist  Social Interaction Social Interaction: 7-Interacts appropriately with others - No medications needed.  Problem Solving Problem Solving Mode: Not assessed Problem Solving: 7-Solves complex problems: Recognizes & self-corrects  Memory Memory: 7-Complete Independence: No helper  Medical Problem List and Plan:  1. Deconditioning after small bowel obstruction. Status post exploratory lap, lysis of adhesions ileostomy revision-postop respiratory failure  2. DVT Prophylaxis/Anticoagulation: Subcutaneous Lovenox. Monitor platelet counts any signs of bleeding  3. Mood: Ativan 0.5 mg each bedtime. Provide emotional support. Family appears quite positive and supportive as well.  4. Neuropsych: This patient is capable of making decisions on his/her own behalf.  5. Dysphagia. Currently n.p.o. Continue nasogastric tube feeds and follow per speech therapy.  6. Respiratory failure. 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 -trach out -occlusive dressing 7. History of rectal cancer. No plan for ongoing radiation therapy at this time  8. Ileostomy: immodium, simethicone -probiotic, opium per surgery -meticulous wound/ostomy mgt  -on D2 thins -holding HS formula to increase app, hold IVF to see how she sustains on her own -closely follow labs -megace initiated to increase appetite -ate more yesterday, pushing supps  as well -replace K+ 9. Sore throat-  -candida esophagitis  -initiated diflucan through ngt and mouth wash as well  -cepacol spray for comfort  LOS (Days) 9 A FACE TO FACE EVALUATION WAS PERFORMED  Erick Colace 01/26/2013 7:35 AM

## 2013-01-26 NOTE — Progress Notes (Signed)
Occupational Therapy Note  Patient Details  Name: Renee Nicholson MRN: [id number] Date of Birth: 1966-02-05 Today's Date: 01/26/2013  Time:  0815-0910  (55 min) Individual session Pain:  Mild surgical stomach pain (3/10)  OT focus of treatment:  Strengthening, balance, functional mobility.  Pt. Ambulated from room to gym with supervision and no AD.  Addressed strength and balance using SCI FIT in standing at 2.0 workload for 5 minutes forward and backward.   Pt. Reported "light" workload on Perceived exertion scale.   Ambulated to ADL apartment.  Pt was independent in emptyting colon ostomy bag.  Ambulated to gym and engaged in balance training using the bioness for 5 minutes.  Pt had difficulty with posterior shifting without using hands.  Used bocce ball for standing balance and rocker board.  Pt able to balance for 1 minute with no hands.  Ambulated back to room.  Sister was present.        Humberto Seals 01/26/2013, 8:26 AM

## 2013-01-27 ENCOUNTER — Inpatient Hospital Stay (HOSPITAL_COMMUNITY): Payer: BC Managed Care – PPO

## 2013-01-27 ENCOUNTER — Inpatient Hospital Stay (HOSPITAL_COMMUNITY): Payer: BC Managed Care – PPO | Admitting: Occupational Therapy

## 2013-01-27 ENCOUNTER — Inpatient Hospital Stay (HOSPITAL_COMMUNITY): Payer: BC Managed Care – PPO | Admitting: Speech Pathology

## 2013-01-27 DIAGNOSIS — R5381 Other malaise: Secondary | ICD-10-CM

## 2013-01-27 DIAGNOSIS — J9600 Acute respiratory failure, unspecified whether with hypoxia or hypercapnia: Secondary | ICD-10-CM

## 2013-01-27 DIAGNOSIS — K56609 Unspecified intestinal obstruction, unspecified as to partial versus complete obstruction: Secondary | ICD-10-CM

## 2013-01-27 LAB — GLUCOSE, CAPILLARY
Glucose-Capillary: 110 mg/dL — ABNORMAL HIGH (ref 70–99)
Glucose-Capillary: 104 mg/dL — ABNORMAL HIGH (ref 70–99)
Glucose-Capillary: 140 mg/dL — ABNORMAL HIGH (ref 70–99)

## 2013-01-27 MED ORDER — POTASSIUM CHLORIDE CRYS ER 20 MEQ PO TBCR
20.0000 meq | EXTENDED_RELEASE_TABLET | Freq: Two times a day (BID) | ORAL | Status: DC
  Administered 2013-01-27 – 2013-01-28 (×2): 20 meq via ORAL
  Filled 2013-01-27 (×5): qty 1

## 2013-01-27 MED ORDER — OPIUM 10 MG/ML (1%) PO TINC: 4.0000 [drp] | Freq: Four times a day (QID) | ORAL | Status: DC

## 2013-01-27 MED ORDER — LOPERAMIDE HCL 2 MG PO CAPS
2.0000 mg | ORAL_CAPSULE | ORAL | Status: DC | PRN
  Administered 2013-01-28: 2 mg via ORAL
  Filled 2013-01-27: qty 1

## 2013-01-27 MED ORDER — LORAZEPAM 0.5 MG PO TABS
0.5000 mg | ORAL_TABLET | Freq: Every day | ORAL | Status: DC
  Administered 2013-01-27: 0.5 mg via ORAL
  Filled 2013-01-27: qty 1

## 2013-01-27 MED ORDER — LOPERAMIDE HCL 1 MG/5ML PO LIQD
2.0000 mg | Freq: Every day | ORAL | Status: DC
  Filled 2013-01-27 (×5): qty 10

## 2013-01-27 MED ORDER — VITAMIN B-1 100 MG PO TABS
100.0000 mg | ORAL_TABLET | Freq: Every day | ORAL | Status: DC
  Administered 2013-01-28: 100 mg via ORAL
  Filled 2013-01-27 (×3): qty 1

## 2013-01-27 MED ORDER — PANTOPRAZOLE SODIUM 40 MG PO PACK
40.0000 mg | PACK | Freq: Two times a day (BID) | ORAL | Status: DC
  Administered 2013-01-27 – 2013-01-28 (×2): 40 mg via ORAL
  Filled 2013-01-27 (×5): qty 20

## 2013-01-27 MED ORDER — ADULT MULTIVITAMIN LIQUID CH
5.0000 mL | Freq: Every day | ORAL | Status: DC
  Administered 2013-01-27 – 2013-01-28 (×2): 5 mL via ORAL
  Filled 2013-01-27 (×3): qty 5

## 2013-01-27 NOTE — Plan of Care (Signed)
Problem: RH SKIN INTEGRITY Goal: RH STG ABLE TO PERFORM INCISION/WOUND CARE W/ASSISTANCE STG Able To Perform Incision/Wound Care With Assistance. Max A  Outcome: Progressing Will require daughter to assist

## 2013-01-27 NOTE — Progress Notes (Signed)
Patient ID: Renee Nicholson, female   DOB: 1965-10-13, 47 y.o.   MRN: [id number] Subjective/Complaints:  Appetite much better. High output, loose stool through ostomy yesterday during the day. Throat, mouth feel better  Denies breathing problems, no abd pain or vomiting Objective: Vital Signs: Blood pressure 105/61, pulse 77, temperature 98.2 F (36.8 C), temperature source Oral, resp. rate 18, weight 31.162 kg (68 lb 11.2 oz), last menstrual period 02/20/2012, SpO2 100.00%. No results found.  Recent Labs  01/25/13 0550  WBC 6.4  HGB 9.4*  HCT 27.7*  PLT 281    Recent Labs  01/25/13 0550  NA 138  K 3.7  CL 100  GLUCOSE 95  BUN 6  CREATININE 0.52  CALCIUM 8.8   CBG (last 3)   Recent Labs  01/26/13 1153 01/26/13 1644 01/26/13 2031  GLUCAP 130* 137* 159*    Wt Readings from Last 3 Encounters:  01/27/13 31.162 kg (68 lb 11.2 oz)  01/17/13 35.925 kg (79 lb 3.2 oz)  01/17/13 35.925 kg (79 lb 3.2 oz)    Physical Exam:  Vitals reviewed.  Constitutional:   NG tube in place, no distress, frail appearing/petite. Tongue, pharynx with white coating--appears improved Eyes: EOM are normal.  Neck:  Trach out/ stoma closing. Dressing in place. Improved phonation Cardiovascular: Normal rate and regular rhythm.  Pulmonary/Chest: Effort normal and breath sounds normal. No respiratory distress. She has no wheezes. No cough. Occasional upper airways sounds. Abdominal: Soft. Bowel sounds are normal. Appropriately tender Neurological: She is alert. She has normal reflexes. No cranial nerve deficit. She exhibits normal muscle tone.  Makes good eye contact with examiner.generally pleasant and alert. Follows all simple commands. Strength remains 3+ to 4/5 UE's. LE's still 2- to 2/5 at HF prox to 3+/4 distally. No sensory deficits. DTR's 1+  Skin:  Surgical site dressed /colostomy in place. Close proximity to healing abdominal wound which continues to granulate in.  Psychiatric:  Alert,  appropriate    Assessment/Plan: 1. Functional deficits secondary to severe deconditioning after SBO, exlap, lysis of adhesions, ileostomy revision which require 3+ hours per day of interdisciplinary therapy in a comprehensive inpatient rehab setting. Physiatrist is providing close team supervision and 24 hour management of active medical problems listed below. Physiatrist and rehab team continue to assess barriers to discharge/monitor patient progress toward functional and medical goals.  Pt is nearly ready for dc. May need another day to work on wound, ostomy care before dc. Will need to see how things go today. She is eating much better!  FIM: FIM - Bathing Bathing Steps Patient Completed: Chest;Right Arm;Left Arm;Abdomen;Front perineal area;Buttocks;Right upper leg;Left upper leg Bathing: 6: More than reasonable amount of time  FIM - Upper Body Dressing/Undressing Upper body dressing/undressing steps patient completed: Thread/unthread right sleeve of pullover shirt/dresss;Thread/unthread left sleeve of pullover shirt/dress;Put head through opening of pull over shirt/dress;Pull shirt over trunk Upper body dressing/undressing: 0: Wears gown/pajamas-no public clothing FIM - Lower Body Dressing/Undressing Lower body dressing/undressing steps patient completed: Thread/unthread right underwear leg;Thread/unthread left underwear leg;Pull underwear up/down;Thread/unthread right pants leg;Thread/unthread left pants leg;Pull pants up/down;Don/Doff right sock;Don/Doff left sock;Don/Doff right shoe;Don/Doff left shoe;Fasten/unfasten right shoe;Fasten/unfasten left shoe Lower body dressing/undressing: 0: Wears gown/pajamas-no public clothing  FIM - Toileting Toileting steps completed by patient: Performs perineal hygiene Toileting: 5: Supervision: Safety issues/verbal cues  FIM - Diplomatic Services operational officer Devices: Grab bars Toilet Transfers: 5-To toilet/BSC: Supervision (verbal  cues/safety issues);5-From toilet/BSC: Supervision (verbal cues/safety issues)  FIM - Banker  Devices: Teacher, music: 5: Chair or W/C > Bed: Supervision (verbal cues/safety issues);5: Bed > Chair or W/C: Supervision (verbal cues/safety issues)  FIM - Locomotion: Wheelchair Locomotion: Wheelchair: 0: Activity did not occur FIM - Locomotion: Ambulation Locomotion: Ambulation Assistive Devices: Emergency planning/management officer Ambulation/Gait Assistance: 5: Supervision Locomotion: Ambulation: 5: Travels 150 ft or more with supervision/safety issues  Comprehension Comprehension Mode: Auditory Comprehension: 7-Follows complex conversation/direction: With no assist  Expression Expression Mode: Verbal Expression Assistive Devices: 6-Other (Comment) Expression: 7-Expresses complex ideas: With no assist  Social Interaction Social Interaction: 7-Interacts appropriately with others - No medications needed.  Problem Solving Problem Solving Mode: Not assessed Problem Solving: 7-Solves complex problems: Recognizes & self-corrects  Memory Memory: 7-Complete Independence: No helper  Medical Problem List and Plan:  1. Deconditioning after small bowel obstruction. Status post exploratory lap, lysis of adhesions ileostomy revision-postop respiratory failure  2. DVT Prophylaxis/Anticoagulation: Subcutaneous Lovenox. Monitor platelet counts any signs of bleeding  3. Mood: Ativan 0.5 mg each bedtime. Provide emotional support. Family appears quite positive and supportive as well.  4. Neuropsych: This patient is capable of making decisions on his/her own behalf.  5. Dysphagia. Currently n.p.o. Continue nasogastric tube feeds and follow per speech therapy.  6. Respiratory failure. 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 -trach out -occlusive dressing--add petroleum gauze 7. History of rectal cancer. No plan for ongoing radiation therapy at this time  8. Ileostomy:  immodium increased, simethicone -probiotic, opium per surgery -meticulous wound/ostomy mgt  -on D2 thins, needs low residue foods -would like for RD and WOC RN to follow up regarding diet, output, wafer adherence etc -closely follow labs -megace initiated to increase appetite, decrease to QD -ate 100% x3 yesterday! 9. Sore throat-  -candida esophagitis--improved  -initiated diflucan, nystatin--continue for 7-10 days total.  -cepacol spray for comfort  LOS (Days) 10 A FACE TO FACE EVALUATION WAS PERFORMED  Lovelle Deitrick T 01/27/2013 8:22 AM

## 2013-01-27 NOTE — Progress Notes (Signed)
Physical Therapy Discharge Summary  Patient Details  Name: Renee Nicholson MRN: [id number] Date of Birth: [birth year]  Today's Date: 01/27/2013  Session #1 Time: 4098-1191 Time Calculation (min): 58 min Individual therapy; Denies pain. Pt completed toileting in the room independently to prepare for therapy. Gait to/from therapy independent  without AD for endurance/general strengthening. Dynamic balance activities including standing on balance zone for anterior/posterior weightshifts to reach for horseshoes and place overhead on basketball hoop and using Wii Fit for tilt table and soccer heading game overall mod I; decreased lateral weightshifts noted during soccer heading game. Administered Berg Balance test (see results below) with significant improvement in score from 48 to 53/55. Discussed results with pt who verbalized understanding. Completed stairs using either bilateral or 1 rail overall mod I for 12 steps. Pt made independent in room and sign placed.  Session #2 Time: 1136-1200 (24 min) Individual therapy; Pt missed first few minutes of session due to nursing care. Focused on overall endurance and strengthening with gait on the unit with quick stops and starts, changes in gait speed, and head turns for challenge of balance - no LOB occurred. Nustep for endurance training x 10 min on level 5.    Patient has met 7 of 7 long term goals due to improved activity tolerance, improved balance and improved coordination.  Patient to discharge at an ambulatory level Independent to mod I without AD.  Family able to provide intermittent assist as needed.   Reasons goals not met: n/a  Recommendation:  Patient will benefit from ongoing skilled PT services in home health setting to continue to advance safe functional mobility, address ongoing impairments in progressing HEP, muscular endurance, high level balance training, and minimize fall risk.  Equipment: No equipment provided  Reasons for discharge:  treatment goals met and discharge from hospital  Patient/family agrees with progress made and goals achieved: Yes  PT Discharge Precautions/Restrictions Precautions Precautions: Fall Precaution Comments: right colostomy Restrictions Weight Bearing Restrictions: No Vision/Perception  Vision - History Baseline Vision: No visual deficits Patient Visual Report: No change from baseline Vision - Assessment Eye Alignment: Within Functional Limits Perception Perception: Within Functional Limits Praxis Praxis: Intact  Cognition Overall Cognitive Status: Within Functional Limits for tasks assessed Arousal/Alertness: Awake/alert Orientation Level: Oriented X4 Focused Attention: Appears intact Memory: Appears intact Awareness: Appears intact Problem Solving: Appears intact Safety/Judgment: Appears intact Sensation Sensation Light Touch: Appears Intact Stereognosis: Appears Intact (BUEs) Hot/Cold: Appears Intact (BUEs) Proprioception: Appears Intact Coordination Gross Motor Movements are Fluid and Coordinated: Yes Fine Motor Movements are Fluid and Coordinated: Yes Motor  Motor Motor: Within Functional Limits  Locomotion  Ambulation Ambulation/Gait Assistance: 7: Independent  Trunk/Postural Assessment  Cervical Assessment Cervical Assessment: Within Functional Limits Thoracic Assessment Thoracic Assessment: Within Functional Limits Lumbar Assessment Lumbar Assessment: Within Functional Limits Postural Control Postural Control: Within Functional Limits  Balance Berg Balance Test Sit to Stand: Able to stand without using hands and stabilize independently Standing Unsupported: Able to stand safely 2 minutes Sitting with Back Unsupported but Feet Supported on Floor or Stool: Able to sit safely and securely 2 minutes Stand to Sit: Sits safely with minimal use of hands Transfers: Able to transfer safely, minor use of hands Standing Unsupported with Eyes Closed: Able to stand  10 seconds safely Standing Ubsupported with Feet Together: Able to place feet together independently and stand 1 minute safely From Standing, Reach Forward with Outstretched Arm: Can reach forward >12 cm safely (5") From Standing Position, Pick up Object from Floor: Able  to pick up shoe safely and easily From Standing Position, Turn to Look Behind Over each Shoulder: Looks behind from both sides and weight shifts well Turn 360 Degrees: Able to turn 360 degrees safely in 4 seconds or less Standing Unsupported, Alternately Place Feet on Step/Stool: Able to stand independently and complete 8 steps >20 seconds Standing Unsupported, One Foot in Front: Able to place foot tandem independently and hold 30 seconds Standing on One Leg: Able to lift leg independently and hold 5-10 seconds Total Score: 53 Extremity Assessment  RUE Assessment RUE Assessment: Within Functional Limits LUE Assessment LUE Assessment: Within Functional Limits RLE Assessment RLE Assessment:  (decreased muscular endurance; grossly WFL) LLE Assessment LLE Assessment:  (decreased muscular endurance; grossly WFL)  See FIM for current functional status  Philip Aspen, PT, DPT  01/27/2013, 9:38 AM

## 2013-01-27 NOTE — Consult Note (Signed)
WOC ostomy follow-up consult: Pt had difficult weekend with ostomy pouch leaking several times.  Applied barrier ring with Kayara pouch and belt to secure in place over open abd wound.  Very difficult pouching situation until open wound is healed.  Pt is familiar with pouch application and routines but will need total assistance at home R/T weakness.  Free samples of pouches have been sent to her house and sister at bedside to translate.  Large amt liquid green output in pouch.  Stoma red and viable, flush with skin level.  Supplies at bedside for staff use.  Pouches need to be emptied Q 2 hours and will need to be changed Q 2 days or PRN leaking.  Cammie Mcgee MSN, RN, CWOCN, [place], CNS [phone]

## 2013-01-27 NOTE — Progress Notes (Signed)
Occupational Therapy Discharge Summary  Patient Details  Name: Renee Nicholson MRN: [id number] Date of Birth: 14-Dec-1965  Today's Date: 01/27/2013  Patient has met 9 of 9 long term goals due to improved activity tolerance, improved balance, postural control, ability to compensate for deficits, improved attention, improved awareness and improved coordination.  Patient to discharge at overall Independent level, except supervision for IADL tasks. Patient's care partner is independent to provide the necessary supervision prn at discharge.    Reasons goals not met: n/a, all goals met at this time.  Recommendation: No additional occupational therapy recommended at this time. Patient is functioning at an overall mod I -> independent level except supervision for IADL tasks.   Equipment: shower seat  Reasons for discharge: treatment goals met and discharge from hospital  Patient/family agrees with progress made and goals achieved: Yes  Precautions/Restrictions  Precautions Precautions: Fall Precaution Comments: right colostomy Restrictions Weight Bearing Restrictions: No  Pain Pain Assessment Pain Assessment: No/denies pain Pain Score: 0-No pain Faces Pain Scale: No hurt  ADL - See FIM  Vision/Perception  Vision - History Baseline Vision: No visual deficits Patient Visual Report: No change from baseline Vision - Assessment Eye Alignment: Within Functional Limits Perception Perception: Within Functional Limits Praxis Praxis: Intact   Cognition Overall Cognitive Status: Within Functional Limits for tasks assessed Arousal/Alertness: Awake/alert Orientation Level: Oriented X4 Focused Attention: Appears intact Memory: Appears intact Awareness: Appears intact Problem Solving: Appears intact Safety/Judgment: Appears intact  Sensation Sensation Light Touch: Appears Intact (BUEs) Stereognosis: Appears Intact (BUEs) Hot/Cold: Appears Intact (BUEs) Proprioception: Appears Intact  (BUEs) Coordination Gross Motor Movements are Fluid and Coordinated: Yes Fine Motor Movements are Fluid and Coordinated: Yes  Motor  Motor Motor: Within Functional Limits  Trunk/Postural Assessment  Cervical Assessment Cervical Assessment: Within Functional Limits Thoracic Assessment Thoracic Assessment: Within Functional Limits Lumbar Assessment Lumbar Assessment: Within Functional Limits Postural Control Postural Control: Within Functional Limits   Balance Berg Balance Test Sit to Stand: Able to stand without using hands and stabilize independently Standing Unsupported: Able to stand safely 2 minutes Sitting with Back Unsupported but Feet Supported on Floor or Stool: Able to sit safely and securely 2 minutes Stand to Sit: Sits safely with minimal use of hands Transfers: Able to transfer safely, minor use of hands Standing Unsupported with Eyes Closed: Able to stand 10 seconds safely Standing Ubsupported with Feet Together: Able to place feet together independently and stand 1 minute safely From Standing, Reach Forward with Outstretched Arm: Can reach forward >12 cm safely (5") From Standing Position, Pick up Object from Floor: Able to pick up shoe safely and easily From Standing Position, Turn to Look Behind Over each Shoulder: Looks behind from both sides and weight shifts well Turn 360 Degrees: Able to turn 360 degrees safely in 4 seconds or less Standing Unsupported, Alternately Place Feet on Step/Stool: Able to stand independently and complete 8 steps >20 seconds Standing Unsupported, One Foot in Front: Able to place foot tandem independently and hold 30 seconds Standing on One Leg: Able to lift leg independently and hold 5-10 seconds Total Score: 53  Extremity/Trunk Assessment RUE Assessment RUE Assessment: Within Functional Limits LUE Assessment LUE Assessment: Within Functional Limits  See FIM for current functional status  Chastity Noland 01/27/2013, 11:16 AM

## 2013-01-27 NOTE — Progress Notes (Signed)
NUTRITION FOLLOW UP  DOCUMENTATION CODES  Per approved criteria   -Severe malnutrition in the context of acute illness or injury    Intervention:   1. Continue Resource Breeze and Ensure Pudding BID per family request. 2. Continue to follow hydration status - adjust free water as needed 3. RD to continue to follow nutrition care plan.  Nutrition Dx:   Inadequate oral intake now related to poor appetite AEB poor meal completion. Improving.  Goal:   Intake to meet >90% of estimated nutrition needs.  Monitor:   weight trends, lab trends, I/O's, PO intake, supplement tolerance  Assessment:   Admitted with SBO likely 2/2 post-op adhesions. Pt is is status post laparoscopic total abdominal colectomy, ileal pouch anal anastomosis, and diverting ileostomy. Pt has had to be admitted several times for small bowel obstruction. Pt with rectal CA T3 N1 s/p chemoradiation.   BSE completed by SLP 4/29 with recommendations for Dysphagia 2 diet with thin liquids. TF and IVF discontinued by MD 5/1; initiation of megace.   Working towards d/c on 5/6.  Pt is progressing well with her appetite and supplements. Encouraged family to continue to maximize intake of supplements as able. Family very appreciative and encouraging.  Pt with increasing sodium, from 136 to 138. Continues free water of 100 ml BID. RD to continue to monitor.  Upgraded to Dysphagia 3 diet on 5/2.  Height: Ht Readings from Last 1 Encounters:  01/04/13 4\' 4"  (1.321 m)    Weight Status:   Wt Readings from Last 1 Encounters:  01/27/13 68 lb 11.2 oz (31.162 kg)    Re-estimated needs:  Kcal: 1250 - 1400 Protein: 55 - 75 grams Fluid: maintain positive balance  Skin: surgical abdominal incisions  Diet Order: Dysphagia 3 with thins   Intake/Output Summary (Last 24 hours) at 01/27/13 1512 Last data filed at 01/27/13 1320  Gross per 24 hour  Intake    480 ml  Output    900 ml  Net   -420 ml    Last BM: 1.1 liters via  ostomy yesterday  Labs:   Recent Labs Lab 01/24/13 0550 01/25/13 0550  NA 138 138  K 3.3* 3.7  CL 100 100  CO2 30 28  BUN 7 6  CREATININE 0.46* 0.52  CALCIUM 8.5 8.8  GLUCOSE 95 95    CBG (last 3)   Recent Labs  01/26/13 2031 01/27/13 0714 01/27/13 1110  GLUCAP 159* 110* 104*    Scheduled Meds: . antiseptic oral rinse  15 mL Mouth Rinse q12n4p  . enoxaparin (LOVENOX) injection  30 mg Subcutaneous Q24H  . feeding supplement  1 Container Oral BID BM  . feeding supplement  1 Container Oral BID BM  . ferrous sulfate  300 mg Oral Q breakfast  . fluconazole  100 mg Oral Daily  . insulin aspart  0-5 Units Subcutaneous QHS  . insulin aspart  0-9 Units Subcutaneous TID WC  . LORazepam  0.5 mg Oral QHS  . megestrol  400 mg Oral BID  . multivitamin  5 mL Oral Daily  . nystatin  5 mL Oral QID  . Opium  4 drop Oral QID  . pantoprazole sodium  40 mg Oral BID  . potassium chloride  20 mEq Oral BID  . saccharomyces boulardii  250 mg Oral BID  . simethicone  40 mg Oral QID  . sodium chloride  10-40 mL Intracatheter Q12H  . [START ON 01/28/2013] thiamine  100 mg Oral Daily  Continuous Infusions: none    Jarold Motto MS, [place], [place] Pager: [phone] After-hours pager: [phone]

## 2013-01-27 NOTE — Progress Notes (Signed)
Speech Language Pathology Daily Session Note  Patient Details  Name: Renee Nicholson MRN: [id number] Date of Birth: January 13, 1966  Today's Date: 01/27/2013 Time: 0800-0830 Time Calculation (min): 30 min  Short Term Goals: Week 1: SLP Short Term Goal 1 (Week 1): Patient will perform pharyngeal strengthening exercises with min A verbal cues, in order to decrease aspiration risk.  SLP Short Term Goal 1 - Progress (Week 1): Progressing toward goal SLP Short Term Goal 2 (Week 1): Patient will be able to produce voice with PMV with supervision and cues, in order to verbally express wants/needs to family/staff.  SLP Short Term Goal 2 - Progress (Week 1): Met SLP Short Term Goal 3 (Week 1): Patient will consume Dys.2 textures and thin liquids with supervision level verbal cues to utilize safe swallow compensatory strategies. SLP Short Term Goal 3 - Progress (Week 1): Progressing toward goal  Skilled Therapeutic Interventions: Treatment focus on speech and dysphagia goals. Pt consumed trails of thin liquids via straw with overt throat clearing with 3 of 4 trials. Recommend to continue current diet of Dys. 3 textures with thin liquids via cup. Pt and pt's  sister in law verbalized understanding.  SLP also facilitated session by providing Max verbal, visual, tactile and demonstration cues for utilization of diaphragmatic breathing to increase overall breath support for increased vocal intensity.  Pt will have NG tube removed today due to eating ~50% of all meals.    FIM:  Comprehension Comprehension Mode: Auditory Comprehension: 6-Follows complex conversation/direction: With extra time/assistive device Expression Expression Mode: Verbal Expression: 5-Expresses basic 90% of the time/requires cueing < 10% of the time. Social Interaction Social Interaction: 6-Interacts appropriately with others with medication or extra time (anti-anxiety, antidepressant). Problem Solving Problem Solving: 6-Solves complex  problems: With extra time Memory Memory: 6-More than reasonable amt of time FIM - Eating Eating Activity: 6: Modified consistency diet: (comment)  Pain Pain Assessment Pain Assessment: No/denies pain  Therapy/Group: Individual Therapy  Meghana Tullo 01/27/2013, 4:33 PM

## 2013-01-27 NOTE — Progress Notes (Signed)
Patient ID: Renee Nicholson, female   DOB: 12/26/1965, 47 y.o.   MRN: [id number]    Subjective: Having issues with stoma leakage. Objective: Vital signs in last 24 hours: Temp:  [98.3 F (36.8 C)] 98.3 F (36.8 C) (05/05 1552) Pulse Rate:  [81] 81 (05/05 1552) Resp:  [18] 18 (05/05 1552) BP: (105)/(68) 105/68 mmHg (05/05 1552) SpO2:  [100 %] 100 % (05/05 1552) Weight:  [68 lb 11.2 oz (31.162 kg)-69 lb 0.1 oz (31.3 kg)] 68 lb 11.2 oz (31.162 kg) (05/05 0610) Last BM Date: 01/27/13  Intake/Output from previous day: 05/04 0701 - 05/05 0700 In: 600 [P.O.:600] Out: 1100 [Stool:1100] Intake/Output this shift: Total I/O In: 240 [P.O.:240] Out: 400 [Stool:400]  General appearance: alert, cooperative and no distress Cardio: regular rate and rhythm GI: soft, non distended, approp tender.  Ostomy improved consistency.   Extremities: extremities normal, atraumatic, no cyanosis or edema  Lab Results:   Recent Labs  01/25/13 0550  WBC 6.4  HGB 9.4*  HCT 27.7*  PLT 281   BMET  Recent Labs  01/25/13 0550  NA 138  K 3.7  CL 100  CO2 28  GLUCOSE 95  BUN 6  CREATININE 0.52  CALCIUM 8.8   PT/INR No results found for this basename: LABPROT, INR,  in the last 72 hours ABG No results found for this basename: PHART, PCO2, PO2, HCO3,  in the last 72 hours  Studies/Results: No results found.  Anti-infectives: Anti-infectives   Start     Dose/Rate Route Frequency Ordered Stop   01/25/13 0800  fluconazole (DIFLUCAN) 40 MG/ML suspension 100 mg     100 mg Oral Daily 01/24/13 0657     01/24/13 0800  fluconazole (DIFLUCAN) 40 MG/ML suspension 200 mg     200 mg Oral  Once 01/24/13 3086 01/24/13 0918      Assessment/Plan: s/p * No surgery found * Ostomy leaking.  Imodium stopped.  WOCN using different type pouches.     LOS: 10 days    Renee Nicholson 01/27/2013

## 2013-01-27 NOTE — Progress Notes (Signed)
Occupational Therapy Session Notes  Patient Details  Name: Renee Nicholson MRN: [id number] Date of Birth: September 01, 1966  Today's Date: 01/27/2013  Skilled Therapeutic Interventions/Progress Updates:   Session #1 1030-1110 - 40 Minutes Individual Therapy No complaints of pain Patient found supine in bed. Patient engaged in bed mobility independently and able to ambulate throughout room independently to gather necessary items for ADL. Patient did not engage in dressing secondary to ostomy bag, per her sister-in-law. Patient is able to dress-self independently, as well as bathe independently. Patient does not use an AE. Patient also sat at sink to wash hair with trach site covered. Patient is independent in room and able to maneuver around room prn.   Session #2 Patient missed 30 minutes of skilled occupational therapy secondary to refusal. Patient stated she was tired and has had her ostomy bag leak 3 times since lunch. Patient has met all OT goals and OT asked if patient had any questions or concerns; patient stated she did not. Discussed shower seat therapist ordered and HHPT and home health nursing.   Precautions:  Precautions Precautions: Fall Precaution Comments: right colostomy, trach Restrictions Weight Bearing Restrictions: No  See FIM for current functional status  Carmon Brigandi 01/27/2013, 7:56 AM

## 2013-01-27 NOTE — Consult Note (Signed)
Ostomy follow-up: Called back R/T ostomy pouch leakage.  Output has become very thick and is clogging very narrow opening on stoma, then liquid runs out around the blockage.  [employer] pouch and belt. Consider discontinuing agents which might contribute to thick stool.  Pt has required dilation of stoma in past according to progress notes.  Difficult pouching situation.  Supplies ordered top room for staff nurses. Cammie Mcgee MSN, RN, CWOCN, [place], CNS [phone]

## 2013-01-28 ENCOUNTER — Inpatient Hospital Stay (HOSPITAL_COMMUNITY): Payer: BC Managed Care – PPO | Admitting: Speech Pathology

## 2013-01-28 LAB — CBC
MCV: 85.8 fL (ref 78.0–100.0)
Platelets: 270 10*3/uL (ref 150–400)
RBC: 3.46 MIL/uL — ABNORMAL LOW (ref 3.87–5.11)
WBC: 7.2 10*3/uL (ref 4.0–10.5)

## 2013-01-28 LAB — COMPREHENSIVE METABOLIC PANEL
ALT: 22 U/L (ref 0–35)
AST: 23 U/L (ref 0–37)
Albumin: 2.7 g/dL — ABNORMAL LOW (ref 3.5–5.2)
Alkaline Phosphatase: 135 U/L — ABNORMAL HIGH (ref 39–117)
CO2: 24 mEq/L (ref 19–32)
Chloride: 103 mEq/L (ref 96–112)
Creatinine, Ser: 0.54 mg/dL (ref 0.50–1.10)
GFR calc non Af Amer: >90 mL/min (ref >90)
Potassium: 4 mEq/L (ref 3.5–5.1)
Total Bilirubin: 0.4 mg/dL (ref 0.3–1.2)

## 2013-01-28 MED ORDER — MEGESTROL ACETATE 400 MG/10ML PO SUSP
400.0000 mg | Freq: Every day | ORAL | Status: DC
  Filled 2013-01-28: qty 10

## 2013-01-28 MED ORDER — SIMETHICONE 40 MG/0.6ML PO SUSP: 40.0000 mg | Freq: Four times a day (QID) | ORAL | Status: DC

## 2013-01-28 MED ORDER — ENSURE PUDDING PO PUDG: 1.0000 | Freq: Two times a day (BID) | ORAL | Status: DC

## 2013-01-28 MED ORDER — FERROUS SULFATE 300 (60 FE) MG/5ML PO SYRP: 300.0000 mg | ORAL_SOLUTION | Freq: Every day | ORAL | Status: DC

## 2013-01-28 MED ORDER — MEGESTROL ACETATE 400 MG/10ML PO SUSP: 400.0000 mg | Freq: Every day | ORAL | Status: DC

## 2013-01-28 MED ORDER — LORAZEPAM 0.5 MG PO TABS: 0.5000 mg | ORAL_TABLET | Freq: Every day | ORAL | Status: DC

## 2013-01-28 MED ORDER — FLUCONAZOLE 40 MG/ML PO SUSR: 100.0000 mg | Freq: Every day | ORAL | Status: DC

## 2013-01-28 MED ORDER — PANTOPRAZOLE SODIUM 40 MG PO PACK: 40.0000 mg | PACK | Freq: Two times a day (BID) | ORAL | Status: DC

## 2013-01-28 MED ORDER — BIOTENE DRY MOUTH MT LIQD: 15.0000 mL | Freq: Two times a day (BID) | OROMUCOSAL | Status: DC

## 2013-01-28 MED ORDER — ONE-DAILY MULTI VITAMINS PO TABS: 1.0000 | ORAL_TABLET | ORAL | Status: DC

## 2013-01-28 MED ORDER — BOOST / RESOURCE BREEZE PO LIQD: 1.0000 | Freq: Two times a day (BID) | ORAL | Status: DC

## 2013-01-28 MED ORDER — LOPERAMIDE HCL 2 MG PO CAPS: 4.0000 mg | ORAL_CAPSULE | Freq: Four times a day (QID) | ORAL | Status: DC | PRN

## 2013-01-28 MED ORDER — B COMPLEX-C PO TABS: 1.0000 | ORAL_TABLET | Freq: Every day | ORAL | Status: AC

## 2013-01-28 NOTE — Consult Note (Signed)
Ostomy follow-up: Pt had ostomy pouch changed this AM and it is leaking into abd wound at this time.  Applied new Kayara pouch and belt.  Difficult to maintain seal R/T flush stoma located next to open abd incision.  Stoma red and viable.  Output less thick than yesterday.  Stoma opening very narrow.  Discussed difficult plan of care with Dr Donell Beers.  Pt will need large amt assistance at home to apply and change ostomy pouch frequently.  She is able to empty without assistance and reapply clamp.  [facility] discharge program samples ordered for home.  Plans to have home health assistance after discharge. Cammie Mcgee MSN, RN, CWOCN, [place], CNS [phone]

## 2013-01-28 NOTE — Progress Notes (Signed)
Social Work  Discharge Note  The overall goal for the admission was met for:   Discharge location: Yes - home with family to provide any needed assistance  Length of Stay: Yes - 11 days  Discharge activity level: Yes - supervision to independent  Home/community participation: Yes  Services provided included: MD, RD, PT, OT, SLP, RN, TR, Pharmacy and SW  Financial Services: Private Insurance: [employer] PPO  Follow-up services arranged: Home Health: RN, ST via [facility], DME: tub seat via [facility] and Patient/Family request agency HH: [facility], DME: [facility]  Comments (or additional information):  Patient/Family verbalized understanding of follow-up arrangements: Yes  Individual responsible for coordination of the follow-up plan: patient  Confirmed correct DME delivered: Electa Sterry 01/28/2013    Brielyn Bosak

## 2013-01-28 NOTE — Progress Notes (Signed)
Late entry: Pt discharged home with family. Discharge instructions provided by Harvel Ricks, PA. All questions answered. Wound nurse placed ostomy pouch and performed dressing change prior to discharge. Pt escorted off unit in w/c with personal belonging by Jacqulyn Cane, NT

## 2013-01-28 NOTE — Progress Notes (Addendum)
Patient ID: Renee Nicholson, female   DOB: 1965-11-13, 47 y.o.   MRN: [id number] Subjective/Complaints:  Good night for the most part. Still some air leaking through stoma  Denies breathing problems, no abd pain or vomiting Objective: Vital Signs: Blood pressure 131/88, pulse 76, temperature 98.1 F (36.7 C), temperature source Oral, resp. rate 18, weight 31.162 kg (68 lb 11.2 oz), last menstrual period 02/20/2012, SpO2 100.00%. No results found.  Recent Labs  01/28/13 0500  WBC 7.2  HGB 10.0*  HCT 29.7*  PLT 270    Recent Labs  01/28/13 0500  NA 139  K 4.0  CL 103  GLUCOSE 95  BUN 5*  CREATININE 0.54  CALCIUM 8.6   CBG (last 3)   Recent Labs  01/27/13 1549 01/27/13 2037 01/28/13 0721  GLUCAP 140* 148* 101*    Wt Readings from Last 3 Encounters:  01/27/13 31.162 kg (68 lb 11.2 oz)  01/17/13 35.925 kg (79 lb 3.2 oz)  01/17/13 35.925 kg (79 lb 3.2 oz)    Physical Exam:  Vitals reviewed.  Constitutional:   NG tube in place, no distress, frail appearing/petite. Tongue, pharynx with white coating--appears improved Eyes: EOM are normal.  Neck:  Trach out/ stoma closing. Dressing in place. Improved phonation Cardiovascular: Normal rate and regular rhythm.  Pulmonary/Chest: Effort normal and breath sounds normal. No respiratory distress. She has no wheezes. No cough. Occasional upper airways sounds. Abdominal: Soft. Bowel sounds are normal. Appropriately tender Neurological: She is alert. She has normal reflexes. No cranial nerve deficit. She exhibits normal muscle tone.  Makes good eye contact with examiner.generally pleasant and alert. Follows all simple commands. Strength remains 3+ to 4/5 UE's. LE's still 2- to 2/5 at HF prox to 3+/4 distally. No sensory deficits. DTR's 1+  Skin:  Surgical site dressed /colostomy in place. Close proximity to healing abdominal wound which continues to granulate in.  Psychiatric:  Alert, appropriate    Assessment/Plan: 1.  Functional deficits secondary to severe deconditioning after SBO, exlap, lysis of adhesions, ileostomy revision which require 3+ hours per day of interdisciplinary therapy in a comprehensive inpatient rehab setting. Physiatrist is providing close team supervision and 24 hour management of active medical problems listed below. Physiatrist and rehab team continue to assess barriers to discharge/monitor patient progress toward functional and medical goals.  Pt can dc to home today from my standpoint with HH follow up.   FIM: FIM - Bathing Bathing Steps Patient Completed: Chest;Right Arm;Left Arm;Abdomen;Front perineal area;Buttocks;Right upper leg;Left upper leg Bathing: 6: More than reasonable amount of time  FIM - Upper Body Dressing/Undressing Upper body dressing/undressing steps patient completed: Thread/unthread right sleeve of pullover shirt/dresss;Thread/unthread left sleeve of pullover shirt/dress;Put head through opening of pull over shirt/dress;Pull shirt over trunk Upper body dressing/undressing: 7: Complete Independence: No helper FIM - Lower Body Dressing/Undressing Lower body dressing/undressing steps patient completed: Thread/unthread right underwear leg;Thread/unthread left underwear leg;Pull underwear up/down;Thread/unthread right pants leg;Thread/unthread left pants leg;Pull pants up/down;Don/Doff right sock;Don/Doff left sock;Don/Doff right shoe;Don/Doff left shoe;Fasten/unfasten right shoe;Fasten/unfasten left shoe Lower body dressing/undressing: 7: Complete Independence: No helper  FIM - Toileting Toileting steps completed by patient: Adjust clothing prior to toileting;Performs perineal hygiene;Adjust clothing after toileting Toileting Assistive Devices: Grab bar or rail for support Toileting: 6: Assistive device: No helper  FIM - Diplomatic Services operational officer Devices: Bedside commode;Elevated toilet seat Toilet Transfers: 6-Assistive device: No helper  FIM  - Banker Devices: Teacher, music: 7: Independent: No helper  FIM -  Locomotion: Wheelchair Locomotion: Wheelchair: 0: Activity did not occur FIM - Locomotion: Ambulation Locomotion: Ambulation Assistive Devices: Emergency planning/management officer Ambulation/Gait Assistance: 7: Independent Locomotion: Ambulation: 7: Travels 150 ft or more independently  Comprehension Comprehension Mode: Auditory Comprehension: 6-Follows complex conversation/direction: With extra time/assistive device  Expression Expression Mode: Verbal Expression Assistive Devices: 6-Other (Comment) Expression: 5-Expresses basic 90% of the time/requires cueing < 10% of the time.  Social Interaction Social Interaction: 6-Interacts appropriately with others with medication or extra time (anti-anxiety, antidepressant).  Problem Solving Problem Solving Mode: Not assessed Problem Solving: 6-Solves complex problems: With extra time  Memory Memory: 6-More than reasonable amt of time  Medical Problem List and Plan:  1. Deconditioning after small bowel obstruction. Status post exploratory lap, lysis of adhesions ileostomy revision-postop respiratory failure  2. DVT Prophylaxis/Anticoagulation: Subcutaneous Lovenox. Monitor platelet counts any signs of bleeding  3. Mood: Ativan 0.5 mg each bedtime. Provide emotional support. Family appears quite positive and supportive as well.  4. Neuropsych: This patient is capable of making decisions on his/her own behalf.  5. Dysphagia. Currently n.p.o. Continue nasogastric tube feeds and follow per speech therapy.  6. Respiratory failure. 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 -trach out -educated RN and daughter regarding wound packing, occlusion of stoma with coughing/speaking -HHRN to follow up. 7. History of rectal cancer. No plan for ongoing radiation therapy at this time  8. Ileostomy: immodium stopped per surgery -probiotic, opium per  surgery -meticulous wound/ostomy mgt  -on D2 thins, needs low residue foods -RD and WOC RN follow up appreciated -labs all showing steady improvement -megace - decrease to QD -ate 100% x3 yesterday! 9. Sore throat-  -candida esophagitis--improved  -initiated diflucan, nystatin--continue for 10 days total.  -cepacol spray for comfort  LOS (Days) 11 A FACE TO FACE EVALUATION WAS PERFORMED  SWARTZ,ZACHARY T 01/28/2013 8:01 AM

## 2013-01-28 NOTE — Progress Notes (Signed)
Speech Language Pathology Session Note & Discharge Summary  Patient Details  Name: Renee Nicholson MRN: [id number] Date of Birth: [birth year]  Today's Date: 01/28/2013 Time: 1100-1130 Time Calculation (min): 30 min  Skilled Therapeutic Intervention: Treatment focus on pt/family education in regards to current swallowing function, diet recommendations, swallowing compensatory strategies and strategies to utilize for increased vocal intensity during functional conversation. Handouts also given. Pt and family verbalized and demonstrated understanding.   Patient has met 2 of 2 long term goals.  Patient to discharge at overall Supervision;Modified Independent level.   Reasons goals not met: N/A   Clinical Impression/Discharge Summary: Pt has made functional gains and has met all LTG's this admission. Pt was decannulated 01/22/13 and is currently able to express her wants/needs but requires supervision verbal cues to utilize diaphragmatic breathing and an increased vocal intensity at the sentence level.  Pt is also currently consuming Dys. 3 textures with thin liquids via cup due to continued overt s/s of aspiration with thin liquids via straw.  Pt/family education complete and pt would benefit from f/u home health skilled SLP intervention to maximize swallowing function with least restrictive diet and overall functional communication.   Care Partner:  Caregiver Able to Provide Assistance: Yes    Recommendation:  Home Health SLP  Rationale for SLP Follow Up: Maximize swallowing safety;Maximize functional communication   Equipment: N/A   Reasons for discharge: Discharged from hospital;Treatment goals met   Patient/Family Agrees with Progress Made and Goals Achieved: Yes   See FIM for current functional status  Renee Nicholson 01/28/2013, 1:58 PM

## 2013-01-28 NOTE — Discharge Summary (Signed)
  Discharge summary job # [id number]

## 2013-01-29 ENCOUNTER — Telehealth (INDEPENDENT_AMBULATORY_CARE_PROVIDER_SITE_OTHER): Payer: Self-pay | Admitting: General Surgery

## 2013-01-29 NOTE — Telephone Encounter (Signed)
Received call from Misty Stanley, nurse with [facility], that the pt has a accessed [facility] from her hospital discharge.  She said the needle is well taped, but definitely accessing the port.  Also, pt was taking potassium supplement (bottle dated October 2013,) so asking if she needs to resume; not on discharge papers.  Paged and updated Dr. Donell Beers, who gave orders to remove the needle from the [employer], flush with heparin and NO more K+ is needed.  Misty Stanley updated and will flush the port as directed.

## 2013-01-29 NOTE — Discharge Summary (Signed)
Renee Nicholson, Renee Nicholson NO.:  [id number]  MEDICAL RECORD NO.:  [id number]  LOCATION:  4025                         FACILITY:  [facility]  PHYSICIAN:  Ranelle Oyster, M.D.DATE OF BIRTH:  January 11, 1966  DATE OF ADMISSION:  01/17/2013 DATE OF DISCHARGE:  01/28/2013                              DISCHARGE SUMMARY   DISCHARGE DIAGNOSES: 1. Deconditioning after small bowel obstruction.  Status post     exploratory laparotomy, lysis of adhesions, ileostomy revision,     postoperative respiratory failure. 2. Subcutaneous Lovenox for deep vein thrombosis prophylaxis. 3. Anxiety. 4. Dysphasia. 5. Respiratory failure with tracheostomy - decannulated. 6. History of rectal cancer. 7. Ileostomy. 8. Decreased nutritional storage. 9. Candida esophagitis.  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  X-rays and imaging consistent with small-bowel obstruction.  Underwent exploratory laparotomy, lysis of adhesions, and ileostomy revision on January 03, 2013, for recurrent small bowel obstruction per Dr. Donell Beers.  Hospital course complicated by tachycardia and hypotension followed by Critical Care Medicine.  The patient required intubation and self-extubated, January 07, 2013.  Bouts of confusion, altered mental status, they continued to improve.  Cranial CT scan, January 08, 2013, showed no acute changes.  The patient with progressive respiratory decline and ultimately required tracheostomy, January 09, 2013, and currently had a #6 tracheostomy in place with plan to downsize on January 20, 2013.  Speech Therapy follow for Passy-Muir valve.  Subcutaneous Lovenox initiated for  DVT prophylaxis.  The patient was currently n.p.o. with nasogastric tube feeds for nutritional support.  Physical, occupational, and speech therapy ongoing with recommendations of Physical Medicine Rehab consult, and the patient was admitted for comprehensive rehab program.  PAST MEDICAL HISTORY:  See discharge diagnoses.  SOCIAL HISTORY:  Lives with spouse, 1 level home.  FUNCTIONAL HISTORY:  Prior to admission, independent.  Functional status upon admission to rehab services was +2 total assist for squat pivot transfers, minimal assist to ambulate 300 feet with a rolling walker.  PHYSICAL EXAMINATION:  VITAL SIGNS:  Blood pressure 120/60, pulse 75, temperature 97.8, respirations 16. GENERAL:  This was an alert female, made good eye contact with examiner. HEENT:  Tracheostomy tube in place with minimal secretions. EXTREMITIES:  Strength grossly graded 3+ to 4 out of 5 upper extremities, 2/5 hip flexors, 3+/4 distally. LUNGS:  Decreased breath sounds, clear to auscultation. CARDIAC:  Regular rate and rhythm. ABDOMEN:  Soft, nontender.  Good bowel sounds.  Colostomy in place.  REHABILITATION HOSPITAL COURSE:  The patient was admitted to inpatient rehab services with therapies initiated on a 3-hour daily basis, consisting of physical, occupational, speech therapy, and 24-hour rehabilitation nursing.  The following issues were addressed during the patient's rehabilitation stay.  Pertaining to Renee Nicholson's deconditioning after small bowel obstruction, she had undergone exploratory laparotomy, lysis of adhesions, as well as ileostomy, colostomy revision postoperatively, with respiratory failure.  Her overall strength and endurance  continued to improve.  Issues related to her ostomy ongoing with wound care nurse, ostomy care, and teaching with family and the patient.  Wound care was addressed as directed.  She did have some bouts of diarrhea.  Her nasogastric tubes were initially  adjusted.  Imodium ultimately later discontinued by General Surgery.  She was treated for a 10-day course with Diflucan for Candida esophagitis.  Her diet was steadily advanced to a mechanical soft.  Monitoring of calorie counts per Dietary Services.  Her tracheostomy tube has since been discontinued.  Educated family regarding wound packing.  Oxygen saturations remained within 90%.  She did have a documented history of rectal cancer.  No plan for ongoing radiation therapy at this time.  As her therapies were ongoing, monitoring of appetite, Megace was added to help stimulate overall appetite, would be tapered off slowly at the time of discharge.  The patient received weekly collaborative interdisciplinary team conferences to discuss estimated length of stay, family teaching, and any barriers to discharge.  She was supervision bed mobility, minimal assist, ambulating, occasional loss of balance, 250 feet with a rolling walker.  Conditioning again continued to improve. Vital signs remained stable during her therapies.  Full family teaching was completed with plan for ongoing therapies as dictated per [employer].  DISCHARGE MEDICATIONS/INSTRUCTIONS:  At the time of discharge included diet to be mechanical soft with thin liquids.  She was on Lovenox throughout her rehab course for DVT prophylaxis and this was discontinued at the time of discharge.  1. Diflucan 100 mg p.o. daily x10 day course total and stop. 2. Ferrous sulfate 300 mg daily with breakfast. 3. Imodium as needed. 4. Ativan 0.5 mg p.o. q.h.s. 5. Megace 400 mg p.o. daily x1 month. 6. Multivitamin 1 tablet p.o. daily. 7. Nystatin suspension 4 times daily as needed. 8. Protonix 40 mg p.o. b.i.d. 9. Mylicon 40 mg p.o. 4 times daily as needed.  SPECIAL INSTRUCTIONS:  Occlusive dressing to the trach site as advised. Abdominal wound with moist fluffed gauze twice daily covered with ABD pad and tape.  Wound ostomy care as  advised.  Pack trach stoma with petroleum gauze and then cover with occlusive dressing daily.  The patient should follow up with Dr. Donell Beers, General Surgery, in 1 to 2 weeks, call for appointment; Dr. Sherril Croon, medical management; Dr. Faith Rogue as needed rehab.     Mariam Dollar, P.A.   ______________________________ Ranelle Oyster, M.D.    DA/MEDQ  D:  01/28/2013  T:  01/29/2013  Job:  [id number]  cc:   [name], MD Doreen Beam, MD

## 2013-02-04 ENCOUNTER — Telehealth: Payer: Self-pay | Admitting: *Deleted

## 2013-02-04 NOTE — Telephone Encounter (Signed)
Renee Nicholson called to request order for speech eval (I think)  But her mobile phone was breaking up really bad and hard to understand. Left VM for her, ok for ST eval, and to call us back if this was not what the request was for.Marland Kitchen

## 2013-02-05 ENCOUNTER — Telehealth: Payer: Self-pay | Admitting: *Deleted

## 2013-02-05 NOTE — Telephone Encounter (Signed)
Called back to clarify yesterday's message.  She is requesting a PT evaluation.  Renee Nicholson is already receiving ST.  Approval given.

## 2013-02-07 ENCOUNTER — Ambulatory Visit (INDEPENDENT_AMBULATORY_CARE_PROVIDER_SITE_OTHER): Payer: BC Managed Care – PPO | Admitting: General Surgery

## 2013-02-07 ENCOUNTER — Encounter (INDEPENDENT_AMBULATORY_CARE_PROVIDER_SITE_OTHER): Payer: Self-pay | Admitting: General Surgery

## 2013-02-07 VITALS — BP 120/78 | HR 82 | Temp 98.2°F | Resp 16 | Ht <= 58 in | Wt <= 1120 oz

## 2013-02-07 DIAGNOSIS — R198 Other specified symptoms and signs involving the digestive system and abdomen: Secondary | ICD-10-CM

## 2013-02-07 DIAGNOSIS — K56609 Unspecified intestinal obstruction, unspecified as to partial versus complete obstruction: Secondary | ICD-10-CM | POA: Insufficient documentation

## 2013-02-07 DIAGNOSIS — Z932 Ileostomy status: Secondary | ICD-10-CM

## 2013-02-07 NOTE — Patient Instructions (Signed)
Increase imodium to 4 times/day.  Try less gauze on midline incision.  Small strip of nonstick gauze.  OK to use a small amount of ointment.  I will try to see about pediatric ostomy supplies.

## 2013-02-07 NOTE — Progress Notes (Signed)
HISTORY: Patient is status post revision of ileostomy for small bowel volvulus around her ostomy site. This was complicated by sepsis and respiratory failure.  She required tracheostomy. She had to go to rehabilitation. Her trach was decannulated. She has had significant issues with her ostomy bag because of proximity to her midline incision.    EXAM: General:  Alert and oriented Incision:  Nearly completely healed.  Ostomy looks healthy   PATHOLOGY: n/a   ASSESSMENT AND PLAN:   Small bowel obstruction Resolved.  High output ileostomy Continue Imodium. Increase to 4 times per day.  Will try to find out if there is a source for pediatric ostomy supplies nearby.  We'll refer for nutrition consult.      Renee Diego, MD Surgical Oncology, General & Endocrine Surgery [facility]  [name], MD [name], MD

## 2013-02-07 NOTE — Assessment & Plan Note (Signed)
Continue Imodium. Increase to 4 times per day.  Will try to find out if there is a source for pediatric ostomy supplies nearby.  We'll refer for nutrition consult.

## 2013-02-07 NOTE — Assessment & Plan Note (Signed)
Resolved

## 2013-02-10 ENCOUNTER — Ambulatory Visit: Payer: BC Managed Care – PPO | Admitting: Oncology

## 2013-02-10 ENCOUNTER — Other Ambulatory Visit: Payer: BC Managed Care – PPO | Admitting: Lab

## 2013-02-10 ENCOUNTER — Telehealth (INDEPENDENT_AMBULATORY_CARE_PROVIDER_SITE_OTHER): Payer: Self-pay

## 2013-02-10 NOTE — Telephone Encounter (Signed)
Discussed options for pediatric ostomy supplies for the patient.  Angelica Chessman, nurse with [facility] will check with her manager and let me know if this is something they can provide for the patient.

## 2013-02-13 ENCOUNTER — Telehealth (INDEPENDENT_AMBULATORY_CARE_PROVIDER_SITE_OTHER): Payer: Self-pay | Admitting: General Surgery

## 2013-02-13 NOTE — Telephone Encounter (Signed)
Mandy with [facility] called back about pediatric ostomy supplies. She spoke with her manager and even though the patient's weight is only around 70 lbs she does have an adult stoma and has frequent stools. The [facility] nurse/manager does not believe pediatric supplies would be large enough for the amount of stool. She wanted to follow up with [place] and let us know they did look into this. They are discharging patient today because she is managing ostomy well. She will now be ordering her own supplies. If we have any questions we can call Mandy back. [phone].

## 2013-02-24 ENCOUNTER — Other Ambulatory Visit (INDEPENDENT_AMBULATORY_CARE_PROVIDER_SITE_OTHER): Payer: Self-pay | Admitting: *Deleted

## 2013-02-24 ENCOUNTER — Telehealth (INDEPENDENT_AMBULATORY_CARE_PROVIDER_SITE_OTHER): Payer: Self-pay | Admitting: *Deleted

## 2013-02-24 DIAGNOSIS — R197 Diarrhea, unspecified: Secondary | ICD-10-CM

## 2013-02-24 DIAGNOSIS — K56609 Unspecified intestinal obstruction, unspecified as to partial versus complete obstruction: Secondary | ICD-10-CM

## 2013-02-24 MED ORDER — LOPERAMIDE HCL 2 MG PO CAPS: 4.0000 mg | ORAL_CAPSULE | Freq: Four times a day (QID) | ORAL | Status: DC | PRN

## 2013-02-24 MED ORDER — MEGESTROL ACETATE 400 MG/10ML PO SUSP: 200.0000 mg | Freq: Every day | ORAL | Status: DC

## 2013-02-24 NOTE — Telephone Encounter (Signed)
Called back to speak with supplier who stated they did not require a signature from the MD.  Stated that pouches have already shipped and rings will ship on 6/12 due to insurance will pay for them then.

## 2013-02-24 NOTE — Telephone Encounter (Signed)
Patient came by the office to request a refill of her Megace and Imodium which Dr. Donell Beers approved.  Patient also asked that we call her supplier for her ostomy supplies to give verbal order for items 7805 and 8528.  [phone]

## 2013-02-24 NOTE — Telephone Encounter (Signed)
Supplier was contacted and verbal order given.  They will be sending by fax prescription for these items for [facility] MD to sign.  Have not received the fax at this time.  However they will go ahead and fill request.

## 2013-03-03 ENCOUNTER — Telehealth (INDEPENDENT_AMBULATORY_CARE_PROVIDER_SITE_OTHER): Payer: Self-pay

## 2013-03-03 NOTE — Telephone Encounter (Signed)
The pharmacy called to get a refill on Lonox 2.5/ 0.25.  Please call [phone].

## 2013-03-04 ENCOUNTER — Other Ambulatory Visit (INDEPENDENT_AMBULATORY_CARE_PROVIDER_SITE_OTHER): Payer: Self-pay

## 2013-03-04 DIAGNOSIS — R197 Diarrhea, unspecified: Secondary | ICD-10-CM

## 2013-03-04 MED ORDER — DIPHENOXYLATE-ATROPINE 2.5-0.025 MG PO TABS: 1.0000 | ORAL_TABLET | Freq: Four times a day (QID) | ORAL | Status: DC | PRN

## 2013-03-04 NOTE — Progress Notes (Signed)
Mardelle Matte with patient pharmacy [phone]) called to see if patient can have a refill on Lomotil (lonox) spoke to Dr. Donell Beers  And rec'd verbal order to fill medication.

## 2013-03-10 ENCOUNTER — Telehealth (INDEPENDENT_AMBULATORY_CARE_PROVIDER_SITE_OTHER): Payer: Self-pay

## 2013-03-10 ENCOUNTER — Ambulatory Visit (INDEPENDENT_AMBULATORY_CARE_PROVIDER_SITE_OTHER): Payer: BC Managed Care – PPO | Admitting: Surgery

## 2013-03-10 ENCOUNTER — Encounter (INDEPENDENT_AMBULATORY_CARE_PROVIDER_SITE_OTHER): Payer: Self-pay | Admitting: Surgery

## 2013-03-10 VITALS — BP 102/70 | HR 72 | Temp 97.8°F | Resp 20 | Ht <= 58 in | Wt 70.4 lb

## 2013-03-10 DIAGNOSIS — C20 Malignant neoplasm of rectum: Secondary | ICD-10-CM

## 2013-03-10 NOTE — Telephone Encounter (Signed)
Pt was seen by Dr. Corliss Skains in Urgent Office today.  She has severe irration around her stoma as a result of leaky bags.  Called Tristan Schroeder, RN at [facility].  She will be happy to see this patient.  Called pt's daughter and gave her Kathy's number.  They will call tomorrow to make an appointment for help with care of ileostomy.

## 2013-03-10 NOTE — Progress Notes (Signed)
URGENT OFFICE.  Patient of Dr. Arita Miss - has loop ileostomy.  Comes in today complaining of leaking pouch, pain around stoma, and burning.  She has had some oozing from the peristomal skin.  Very thin ileostomy output  The bag was removed.  The skin around the stoma is very raw and irritated from the ostomy output.  The midline incision is almost completely healed with a thin line of granulation tissue.  I cauterized it with some silver nitrate.  The patient has a spare ostomy appliance, which I applied.  It appears that the opening of the ostomy appliance is too large, allowing the output to seep under the wafer.  Home Health is no longer seeing the patient.  I asked the patient and her daughter to cut a smaller hole in the appliance and apply Stomahesive around the edge of the stoma.  We will arrange an outpatient appt with the ostomy nurse as soon as possible.  Keep follow-up with Dr. Donell Beers.  Wilmon Arms. Corliss Skains, MD, [facility] Surgery  General/ Trauma Surgery  03/10/2013 4:20 PM

## 2013-03-15 ENCOUNTER — Other Ambulatory Visit (INDEPENDENT_AMBULATORY_CARE_PROVIDER_SITE_OTHER): Payer: Self-pay | Admitting: General Surgery

## 2013-03-15 DIAGNOSIS — R197 Diarrhea, unspecified: Secondary | ICD-10-CM

## 2013-03-18 ENCOUNTER — Telehealth (INDEPENDENT_AMBULATORY_CARE_PROVIDER_SITE_OTHER): Payer: Self-pay | Admitting: *Deleted

## 2013-03-18 NOTE — Telephone Encounter (Signed)
Spoke to the pharmacist at [facility] and approved a refill of Lomotil at this time with 5 refills.  Dr. Donell Beers had requested 12 refills however because medication is a controlled substance only 5 refills are allowed at a time.

## 2013-03-20 ENCOUNTER — Telehealth: Payer: Self-pay | Admitting: Oncology

## 2013-03-20 ENCOUNTER — Ambulatory Visit (HOSPITAL_BASED_OUTPATIENT_CLINIC_OR_DEPARTMENT_OTHER): Payer: BC Managed Care – PPO | Admitting: Oncology

## 2013-03-20 ENCOUNTER — Encounter: Payer: Self-pay | Admitting: Oncology

## 2013-03-20 ENCOUNTER — Other Ambulatory Visit (HOSPITAL_BASED_OUTPATIENT_CLINIC_OR_DEPARTMENT_OTHER): Payer: BC Managed Care – PPO | Admitting: Lab

## 2013-03-20 VITALS — BP 120/73 | HR 59 | Temp 98.9°F | Resp 20 | Ht <= 58 in | Wt 71.1 lb

## 2013-03-20 DIAGNOSIS — C20 Malignant neoplasm of rectum: Secondary | ICD-10-CM

## 2013-03-20 LAB — COMPREHENSIVE METABOLIC PANEL (CC13)
AST: 26 U/L (ref 5–34)
Albumin: 3.6 g/dL (ref 3.5–5.0)
BUN: 14.7 mg/dL (ref 7.0–26.0)
Calcium: 9.2 mg/dL (ref 8.4–10.4)
Chloride: 108 mEq/L (ref 98–109)
Potassium: 3.9 mEq/L (ref 3.5–5.1)
Sodium: 140 mEq/L (ref 136–145)
Total Protein: 6.8 g/dL (ref 6.4–8.3)

## 2013-03-20 LAB — CBC WITH DIFFERENTIAL/PLATELET
Basophils Absolute: 0 10*3/uL (ref 0.0–0.1)
EOS%: 4.2 % (ref 0.0–7.0)
Eosinophils Absolute: 0.2 10*3/uL (ref 0.0–0.5)
HCT: 35 % (ref 34.8–46.6)
HGB: 11.8 g/dL (ref 11.6–15.9)
MCH: 29.5 pg (ref 25.1–34.0)
MCV: 87.5 fL (ref 79.5–101.0)
MONO%: 7.9 % (ref 0.0–14.0)
NEUT#: 2.8 10*3/uL (ref 1.5–6.5)
NEUT%: 52.9 % (ref 38.4–76.8)
Platelets: 194 10*3/uL (ref 145–400)

## 2013-03-20 NOTE — Patient Instructions (Addendum)
Increase your protein intake   I will see you back in 6 months

## 2013-03-20 NOTE — Telephone Encounter (Signed)
, °

## 2013-03-24 ENCOUNTER — Other Ambulatory Visit (INDEPENDENT_AMBULATORY_CARE_PROVIDER_SITE_OTHER): Payer: Self-pay | Admitting: General Surgery

## 2013-03-24 ENCOUNTER — Other Ambulatory Visit (INDEPENDENT_AMBULATORY_CARE_PROVIDER_SITE_OTHER): Payer: Self-pay

## 2013-03-24 ENCOUNTER — Telehealth (INDEPENDENT_AMBULATORY_CARE_PROVIDER_SITE_OTHER): Payer: Self-pay

## 2013-03-24 ENCOUNTER — Ambulatory Visit (INDEPENDENT_AMBULATORY_CARE_PROVIDER_SITE_OTHER): Payer: BC Managed Care – PPO | Admitting: General Surgery

## 2013-03-24 DIAGNOSIS — K56609 Unspecified intestinal obstruction, unspecified as to partial versus complete obstruction: Secondary | ICD-10-CM

## 2013-03-24 DIAGNOSIS — R198 Other specified symptoms and signs involving the digestive system and abdomen: Secondary | ICD-10-CM

## 2013-03-24 DIAGNOSIS — C20 Malignant neoplasm of rectum: Secondary | ICD-10-CM

## 2013-03-24 DIAGNOSIS — B379 Candidiasis, unspecified: Secondary | ICD-10-CM

## 2013-03-24 DIAGNOSIS — C499 Malignant neoplasm of connective and soft tissue, unspecified: Secondary | ICD-10-CM

## 2013-03-24 MED ORDER — FLUCONAZOLE 100 MG PO TABS: 100.0000 mg | ORAL_TABLET | Freq: Every day | ORAL | Status: DC

## 2013-03-24 MED ORDER — NYSTATIN 100000 UNIT/GM EX POWD: CUTANEOUS | Status: DC

## 2013-03-24 NOTE — Assessment & Plan Note (Signed)
Nystatin powder and diflucan.

## 2013-03-24 NOTE — Progress Notes (Signed)
HISTORY: Patient continues to have burning around stoma as well as frequent leaking of ostomy bag.  She is having difficulty gaining weight, but is maintaining weight.  She is not having any nausea or vomiting.  She is using bag with smaller opening in order to try to decrease contact of stool with skin.  She denies fevers/ chills.  She is still afraid to eat because of bag leakage.     PERTINENT REVIEW OF SYSTEMS: Otherwise negative.    P 84, Wt 70.6, BP 120/80, P 84  EXAM: Head: Normocephalic and atraumatic. Very thin.   Eyes:  Conjunctivae are normal. Pupils are equal, round, and reactive to light. No scleral icterus.   Resp: No respiratory distress, normal effort. Abd:  Abdomen is soft, non distended and non tender. Concave.  Skin around stoma with bright redness and excoriations.  Stool leaking onto midline wound.  No masses are palpable.  There is no rebound and no guarding.  Neurological: Alert and oriented to person, place, and time. Coordination normal.  Skin: Skin is warm and dry. No rash noted. No diaphoretic. No erythema. No pallor.  Psychiatric: Normal mood and affect. Normal behavior. Judgment and thought content normal.      ASSESSMENT AND PLAN:   Small bowel obstruction Granulation tissue cauterized with silver nitrate.    Wound nearly closed.   Bowel obstruction resolved.     Candidiasis of peristomal skin. Nystatin powder and diflucan.    Rectal cancer s/p proctocolectomy, j-pouch with ileoanal anastomosis, loop ileostomy on 12/14/11 Will plan repeat PT evaluation and recheck albumin and prealbumin prior to scheduling ostomy takedown.    High output ileostomy Added back lomotil, but patient still having some high output.   Advised to eat low fiber diet.        Maudry Diego, MD Surgical Oncology, General & Endocrine Surgery [facility]  [name], MD [name], MD

## 2013-03-24 NOTE — Assessment & Plan Note (Signed)
Granulation tissue cauterized with silver nitrate.    Wound nearly closed.   Bowel obstruction resolved.

## 2013-03-24 NOTE — Assessment & Plan Note (Signed)
Added back lomotil, but patient still having some high output.   Advised to eat low fiber diet.

## 2013-03-24 NOTE — Patient Instructions (Signed)
Use nystatin powder around ostomy (new prescription).  Diflucan orally for yeast infection.  Go back to physical therapy for pelvic floor rehab  Get nutritional labs drawn.    We are sending new script for lomotil.

## 2013-03-24 NOTE — Telephone Encounter (Signed)
Lomotil .25-.025mg  #240 take (2) 4 x daily, w/ 5 refills.  Pharmacy would not give 11 refills because controlled substance.

## 2013-03-24 NOTE — Assessment & Plan Note (Signed)
Will plan repeat PT evaluation and recheck albumin and prealbumin prior to scheduling ostomy takedown.

## 2013-03-25 ENCOUNTER — Other Ambulatory Visit (INDEPENDENT_AMBULATORY_CARE_PROVIDER_SITE_OTHER): Payer: Self-pay | Admitting: General Surgery

## 2013-03-25 DIAGNOSIS — C20 Malignant neoplasm of rectum: Secondary | ICD-10-CM

## 2013-03-25 LAB — COMPREHENSIVE METABOLIC PANEL
ALT: 21 U/L (ref 0–35)
AST: 23 U/L (ref 0–37)
Alkaline Phosphatase: 67 U/L (ref 39–117)
BUN: 15 mg/dL (ref 6–23)
Calcium: 9.3 mg/dL (ref 8.4–10.5)
Chloride: 106 mEq/L (ref 96–112)
Creat: 0.71 mg/dL (ref 0.50–1.10)
Total Bilirubin: 0.7 mg/dL (ref 0.3–1.2)

## 2013-03-25 IMAGING — CR DG ABDOMEN 2V
2 series · 2 of 2 positions shown · non-contrast
Comparison: 11/16/2012

CLINICAL DATA: Small bowel obstruction

ABDOMEN - 2 VIEW

[w abdomen upright *]
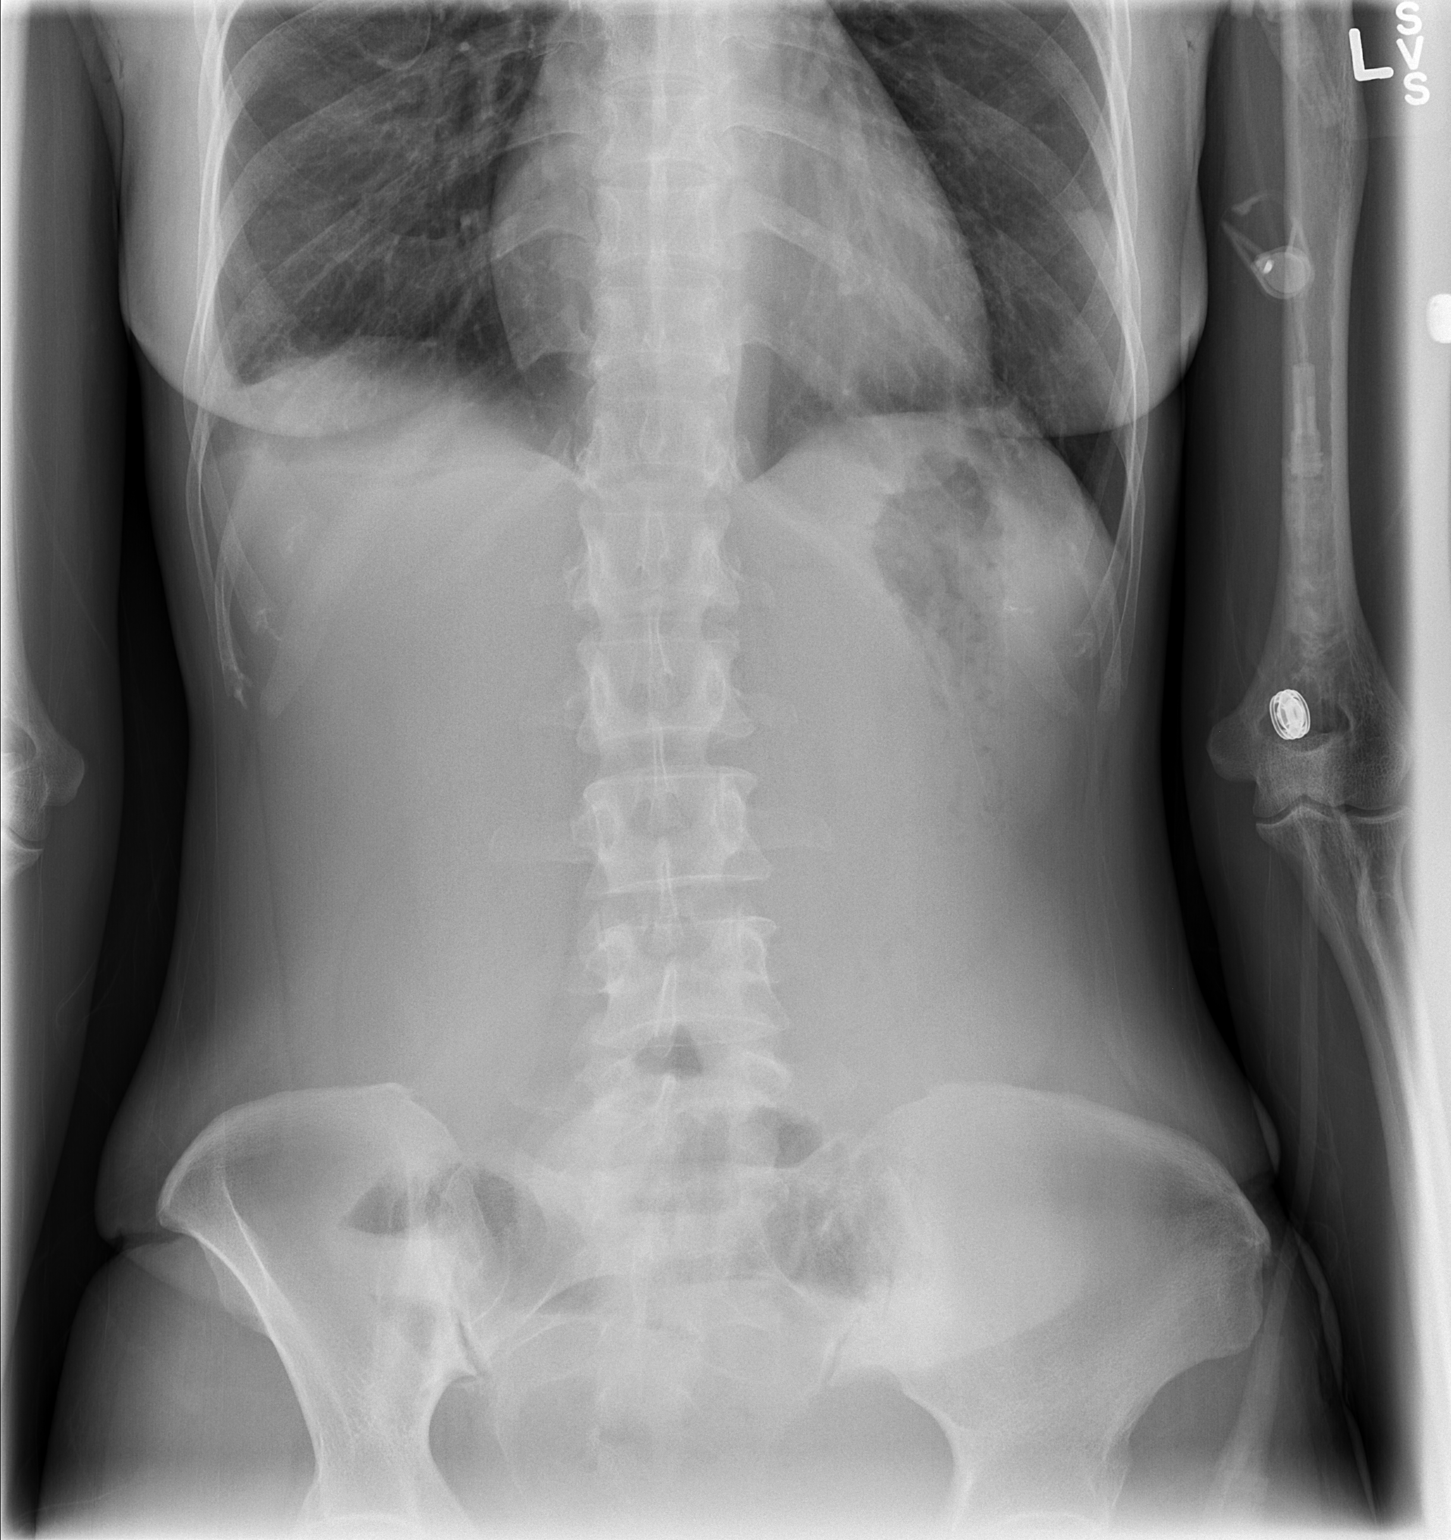

[t abdomen supine]
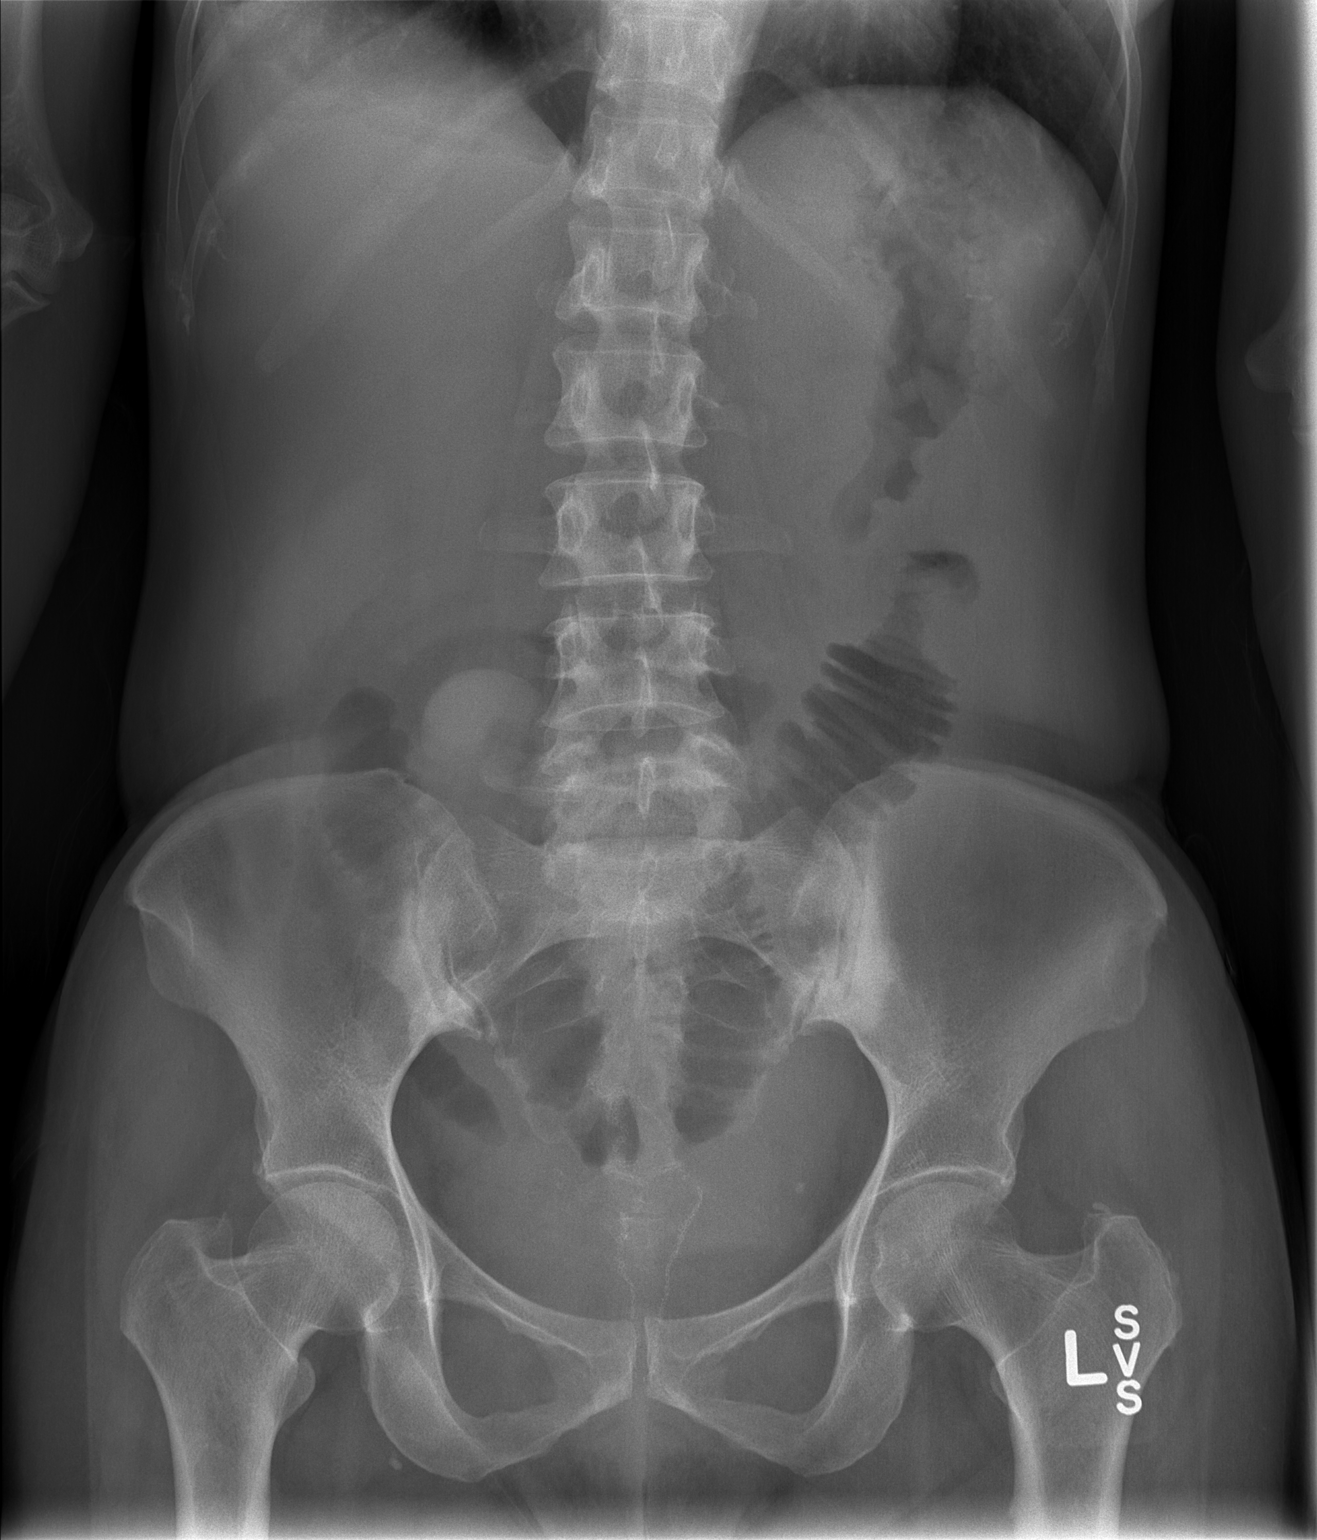

[2 of 2 positions shown; findings below may reference images not displayed]

FINDINGS: Again noted gaseous distended small bowel loops in the
lower abdomen with slight improvement from prior exam.
IMPRESSION: Again noted gaseous distended small bowel loops in lower abdomen
with slight improvement from prior exam.  Findings consistent with
slight improvement of bowel obstruction.

## 2013-03-26 ENCOUNTER — Telehealth (INDEPENDENT_AMBULATORY_CARE_PROVIDER_SITE_OTHER): Payer: Self-pay | Admitting: General Surgery

## 2013-03-26 NOTE — Telephone Encounter (Signed)
Spoke with patient and spouse they are aware of Physical Therapy AMB,  On 03/31/13 2:30

## 2013-03-27 ENCOUNTER — Telehealth (INDEPENDENT_AMBULATORY_CARE_PROVIDER_SITE_OTHER): Payer: Self-pay | Admitting: General Surgery

## 2013-03-27 NOTE — Telephone Encounter (Signed)
Patient calling for lab results. Made her aware they look pretty normal. Her glucose is mildly elevated along with her prealbumin but nothing looked incredibly abnormal. Made her aware Dr Donell Beers has not reviewed yet and we would call her back once they were reviewed.

## 2013-03-31 ENCOUNTER — Ambulatory Visit: Payer: BC Managed Care – PPO | Attending: General Surgery | Admitting: Physical Therapy

## 2013-03-31 ENCOUNTER — Ambulatory Visit: Payer: BC Managed Care – PPO | Admitting: Physical Therapy

## 2013-03-31 ENCOUNTER — Encounter (INDEPENDENT_AMBULATORY_CARE_PROVIDER_SITE_OTHER): Payer: BC Managed Care – PPO | Admitting: General Surgery

## 2013-03-31 DIAGNOSIS — M2420 Disorder of ligament, unspecified site: Secondary | ICD-10-CM | POA: Insufficient documentation

## 2013-03-31 DIAGNOSIS — M629 Disorder of muscle, unspecified: Secondary | ICD-10-CM | POA: Insufficient documentation

## 2013-03-31 DIAGNOSIS — IMO0001 Reserved for inherently not codable concepts without codable children: Secondary | ICD-10-CM | POA: Insufficient documentation

## 2013-03-31 NOTE — Telephone Encounter (Signed)
Please let pt know that nutritional labs are good.  All we need is PT.

## 2013-04-01 ENCOUNTER — Ambulatory Visit (INDEPENDENT_AMBULATORY_CARE_PROVIDER_SITE_OTHER): Payer: BC Managed Care – PPO | Admitting: General Surgery

## 2013-04-01 ENCOUNTER — Encounter (INDEPENDENT_AMBULATORY_CARE_PROVIDER_SITE_OTHER): Payer: Self-pay | Admitting: General Surgery

## 2013-04-01 ENCOUNTER — Ambulatory Visit: Payer: BC Managed Care – PPO | Admitting: Physical Therapy

## 2013-04-01 VITALS — BP 108/68 | HR 72 | Temp 98.6°F | Resp 14 | Ht <= 58 in | Wt <= 1120 oz

## 2013-04-01 DIAGNOSIS — R198 Other specified symptoms and signs involving the digestive system and abdomen: Secondary | ICD-10-CM

## 2013-04-01 DIAGNOSIS — Z932 Ileostomy status: Secondary | ICD-10-CM

## 2013-04-01 DIAGNOSIS — L0390 Cellulitis, unspecified: Secondary | ICD-10-CM

## 2013-04-01 DIAGNOSIS — B379 Candidiasis, unspecified: Secondary | ICD-10-CM

## 2013-04-01 MED ORDER — LEVOFLOXACIN 500 MG PO TABS: 500.0000 mg | ORAL_TABLET | Freq: Every day | ORAL | Status: AC

## 2013-04-01 MED ORDER — FLUCONAZOLE 100 MG PO TABS: 100.0000 mg | ORAL_TABLET | Freq: Every day | ORAL | Status: AC

## 2013-04-01 NOTE — Assessment & Plan Note (Signed)
Schedule ileostomy takedown.    Reviewed risks with patient.    Unless major improvement in cellulitis, would definitely leave wound open.

## 2013-04-01 NOTE — Assessment & Plan Note (Addendum)
Refill diflucan \ Place gauze on top of ostomy appliance.   Add oral antibiotic.

## 2013-04-01 NOTE — Patient Instructions (Signed)
Will set up surgery for ostomy takedown.  Follow up after that  Get antibiotic and antifungal script and take.  Get PT

## 2013-04-01 NOTE — Progress Notes (Signed)
HISTORY: Pt is s/p revision ileostomy/SBR for SBO.  She continues to have issues gaining weight, but her albumin and prealbumin are good.  She has issues getting the bag to stay on without leaking.  She has gotten significant burning and redness of skin around incision.    EXAM: General:   Alert and oriented.   Incision:  Red, skin breakdown.     PATHOLOGY: n/a   ASSESSMENT AND PLAN:   Candidiasis of peristomal skin. Refill diflucan \ Place gauze on top of ostomy appliance.   Add oral antibiotic.   High output ileostomy Schedule ileostomy takedown.    Reviewed risks with patient.    Unless major improvement in cellulitis, would definitely leave wound open.          Maudry Diego, MD Surgical Oncology, General & Endocrine Surgery [facility]  [name], MD No ref. provider found

## 2013-04-02 ENCOUNTER — Ambulatory Visit: Payer: BC Managed Care – PPO | Admitting: Physical Therapy

## 2013-04-04 ENCOUNTER — Other Ambulatory Visit (INDEPENDENT_AMBULATORY_CARE_PROVIDER_SITE_OTHER): Payer: Self-pay | Admitting: General Surgery

## 2013-04-04 ENCOUNTER — Telehealth (INDEPENDENT_AMBULATORY_CARE_PROVIDER_SITE_OTHER): Payer: Self-pay | Admitting: General Surgery

## 2013-04-04 NOTE — Telephone Encounter (Signed)
Mr Micheline Rough has called twice left message why have I not scheduled his wifes surgery with Dr Donell Beers.

## 2013-04-06 NOTE — Progress Notes (Signed)
OFFICE PROGRESS NOTE  CC: [name], MD [name], MD [name], MD [name], MD [address] [place] [address] [name], MD  DIAGNOSIS: 47 year old female with new diagnosis of rectal carcinoma by rectal ultrasound she was found to have a T3 N1 lesion.  PRIOR THERAPY:   #1 patient presented with a several month history of diarrhea and then subsequent bleeding. She was found to be anemic with a hemoglobin of 10. Because of this she went on to have a colonoscopy performed. The colonoscopy showed multiple sessile polyps in the cecum ascending transverse and sigmoid colon as well as the rectum. The rectal area showed a large exophytic rectal mass approximately 1 cm above the dentate line measuring 3-4 cm. She had a biopsy of this performed and was found to be an adenocarcinoma consistent with a rectal primary.  #2 patient was seen by Dr. [name] at [facility] who performed a rectal ultrasound. The staging clinically was T3 N1. It was recommended by Dr. [name] the patient undergo neoadjuvant chemotherapy and radiation higher to her definitive surgery.  #3 Patient has completed concurrent radiation and chemotherapy. Her chemotherapy consisted of single agent xeloda administrated 08/22/11 - 10/09/2011. Overall she tolerated her treatment very well  #4 Has completed all of neoadjuvant treatment And the PET scan shows no evidence of distant metastasis and a para rectal peritoneal nodes less impressive she has had significant response to therapy.  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 APC is negative  #6 patient had a laparotomy with proctocolectomy, J-pouch with ileoanal anastomosis, loop ileostomy on 12/14/2011. The final pathology did not reveal any evidence of residual disease. Patient had a  complicated postop course including high output ileostomy. She required intensive IV fluids on an outpatient basis to prevent dehydration.  #7 patient began adjuvant chemotherapy consisting of XELOX starting 03/05/2012 on a 21 day cycle. A Total of 6 cycles were planned unfortunate patient was not able to tolerate this therapy due to side effects from the xeloda With development of hand-foot syndrome.She was not able to tolerate oxaliplatin due to significant toxicity from cold sensitivity. In that this treatment is being discontinued.  #8 patient subsequently began adjuvant FOLFIRI beginning on 06/04/2012. She is planned for 4 cycles. She  completed all her chemotherapy on 07/16/12  #9 patient had a recent admission to [facility] intensive care for bowel obstruction. She also has fungal infection she is on Diflucan.  CURRENT THERAPY: Observation  INTERVAL HISTORY: Renee Nicholson 47 y.o. female returns for followup visit today. She's doing well today.  She continues to have Heidi output in her ostomy bag. Having trouble gaining weight but her She has no dysuria, or hematuria, no abd/flank pain.  No diarrhea, fevers, chills.  Otherwise she's doing well and a 10 point ROS is neg.   MEDICAL HISTORY: Past Medical History  Diagnosis Date  . Anemia   . Diarrhea   . Anxiety   . Bright red rectal bleeding 09/13/2011  . History of chemotherapy     completed 09/2011   . Hx of radiation therapy 08/29/11 to 10/09/11    rectum  . GERD (gastroesophageal reflux disease)   . Ileostomy in place   . Cancer   . Rectal cancer     ALLERGIES:  has No Known Allergies.  MEDICATIONS:  Current Outpatient Prescriptions  Medication Sig Dispense Refill  . B Complex-C (B-COMPLEX WITH VITAMIN C) tablet Take 1 tablet by mouth daily.  30 tablet  1  . diphenoxylate-atropine (LOMOTIL) 2.5-0.025 MG per tablet TAKE ONE (1) TABLET FOUR (4) TIMES PER DAY AS NEEDED FOR DIARRHEA OR LOOSE STOOLS  60 tablet  12  .  feeding supplement (ENSURE) PUDG Take 1 Container by mouth 2 (two) times daily between meals.      . feeding supplement (RESOURCE BREEZE) LIQD Take 1 Container by mouth 2 (two) times daily between meals.      Marland Kitchen loperamide (IMODIUM) 2 MG capsule Take 2 capsules (4 mg total) by mouth 4 (four) times daily as needed for diarrhea or loose stools.  30 capsule  1  . megestrol (MEGACE) 400 MG/10ML suspension Take 5 mLs (200 mg total) by mouth daily.  240 mL  2  . Multiple Vitamin (MULTIVITAMIN) tablet Take 1 tablet by mouth every morning.      Marland Kitchen antiseptic oral rinse (BIOTENE) LIQD 15 mLs by Mouth Rinse route 2 times daily at 12 noon and 4 pm.  1 Bottle  0  . ferrous sulfate 300 (60 FE) MG/5ML syrup Take 5 mLs (300 mg total) by mouth daily with breakfast.  150 mL  1  . fluconazole (DIFLUCAN) 100 MG tablet Take 1 tablet (100 mg total) by mouth daily.  30 tablet  0  . levofloxacin (LEVAQUIN) 500 MG tablet Take 1 tablet (500 mg total) by mouth daily.  30 tablet  0  . nystatin (MYCOSTATIN) powder Apply to affected area up to 3 times daily with ostomy bag change  15 g  0  . pantoprazole sodium (PROTONIX) 40 mg/20 mL PACK Take 20 mLs (40 mg total) by mouth 2 (two) times daily.  60 each  1  . simethicone (MYLICON) 40 MG/0.6ML drops Take 0.6 mLs (40 mg total) by mouth 4 (four) times daily.  30 mL  0   No current facility-administered medications for this visit.   Facility-Administered Medications Ordered in Other Visits  Medication Dose Route Frequency Provider Last Rate Last Dose  . heparin lock flush 100 unit/mL  500 Units Intravenous Once Victorino December, MD      . sodium chloride 0.9 % injection 10 mL  10 mL Intravenous PRN Victorino December, MD        SURGICAL HISTORY:  Past Surgical History  Procedure Laterality Date  . Tubal ligation    . Carpal tunnel release    . Colonoscopy  07/26/2011    Procedure: COLONOSCOPY;  Surgeon: Arlyce Harman, MD;  Location: [facility];  Service: Endoscopy;   Laterality: N/A;  10:40  . Esophagogastroduodenoscopy  07/26/2011    Procedure: ESOPHAGOGASTRODUODENOSCOPY (EGD);  Surgeon: Arlyce Harman, MD;  Location: [facility];  Service: Endoscopy;  Laterality: N/A;  . Esophagogastroduodenoscopy  12/27/2011    Procedure: ESOPHAGOGASTRODUODENOSCOPY (EGD);  Surgeon: Shirley Friar, MD;  Location: [facility];  Service: Endoscopy;  Laterality: N/A;  . Laparoscopic colon resection  12/14/11    diverting ileostomy  . Portacath placement  02/21/2012    Procedure: INSERTION PORT-A-CATH;  Surgeon: [name], MD;  Location: [facility] OR;  Service: General;  Laterality: N/A;  . Evaluation under anesthesia with anal fissurotomy N/A 11/06/2012    Procedure: Exam Under Anesthesia , Anal Dilation;  Surgeon: [name], MD;  Location: [facility] ORS;  Service: General;  Laterality: N/A;  Exam Under Anesthesia , Anal Dilation  .  Laparotomy N/A 01/03/2013    Procedure: EXPLORATORY LAPAROTOMY WITH LYSIS OF ADHESIONS, revision of ileostomy;  Surgeon: [name], MD;  Location: [facility] OR;  Service: General;  Laterality: N/A;    REVIEW OF SYSTEMS:   General: fatigue (+), night sweats (-), fever (-), pain (-) Lymph: palpable nodes (-) HEENT: vision changes (-), mucositis (-), gum bleeding (-), epistaxis (-) Cardiovascular: chest pain (-), palpitations (-) Pulmonary: shortness of breath (-), dyspnea on exertion (-), cough (-), hemoptysis (-) GI:  Early satiety (-), melena (-), dysphagia (-), nausea/vomiting (-), diarrhea (-) GU: dysuria (-), hematuria (-), incontinence (-) Musculoskeletal: joint swelling (-), joint pain (-), back pain (-) Neuro: weakness (-), numbness (-), headache (-), confusion (-) Skin: Rash (-), lesions (-), dryness (-) Psych: depression (-), suicidal/homicidal ideation (-), feeling of hopelessness (-)   PHYSICAL EXAMINATION:  BP 120/73  Pulse 59  Temp(Src) 98.9 F (37.2 C) (Oral)  Resp 20  Ht 4\' 9"  (1.448 m)  Wt 71 lb 1.6 oz (32.251 kg)  BMI 15.38  kg/m2  LMP 02/20/2012 Gen.: Patient is a thin female but in no acute distress HEENT: PERRLA sclerae anicteric no conjunctival pallor oral mucosa is moist no thrush Neck: supple, no palpable adenopathy Lungs: clear to auscultation, no wheezes, rhonchi, or rales Cardiovascular: regular rate rhythm, S1, S2, no murmurs, rubs or gallops Abdomen: Soft, non-tender, non-distended, normoactive bowel sounds, no HSM Stoma site is pink Extremities: warm and well perfused, no clubbing, cyanosis, or edema Skin: No rashes or lesions ECOG PERFORMANCE STATUS: 1 - Symptomatic but completely ambulatory    LABORATORY DATA: Lab Results  Component Value Date   WBC 5.3 03/20/2013   HGB 11.8 03/20/2013   HCT 35.0 03/20/2013   MCV 87.5 03/20/2013   PLT 194 03/20/2013       Chemistry      Component Value Date/Time   NA 142 03/25/2013 1225   NA 140 03/20/2013 1108   K 3.9 03/25/2013 1225   K 3.9 03/20/2013 1108   CL 106 03/25/2013 1225   CL 103 12/12/2012 1037   CO2 26 03/25/2013 1225   CO2 26 03/20/2013 1108   BUN 15 03/25/2013 1225   BUN 14.7 03/20/2013 1108   CREATININE 0.71 03/25/2013 1225   CREATININE 0.7 03/20/2013 1108   CREATININE 0.54 01/28/2013 0500      Component Value Date/Time   CALCIUM 9.3 03/25/2013 1225   CALCIUM 9.2 03/20/2013 1108   ALKPHOS 67 03/25/2013 1225   ALKPHOS 72 03/20/2013 1108   AST 23 03/25/2013 1225   AST 26 03/20/2013 1108   ALT 21 03/25/2013 1225   ALT 25 03/20/2013 1108   BILITOT 0.7 03/25/2013 1225   BILITOT 0.77 03/20/2013 1108     ADDITIONAL INFORMATION: 1. Following placement in clearing solution, three more lymph nodes were identified, all of which are benign. Thus, the total lymph node count is 11. (JK:mw 12-18-11) 2. Following an exhaustive search, additional tissue has been submitted in an attempt to recover lymph nodes. Two more lymph nodes were identified, both benign. This brings the total number of lymph nodes recovered to 13, all of which are benign. (JBK:eps 12/26/11) FINAL  DIAGNOSIS Diagnosis 1. Colon, resection margin (donut), distal rectal - COLORECTAL MUCOSA WITH FIBROSIS AND EDEMA, CONSISTENT WITH TREATMENT. - THERE IS NO EVIDENCE OF MALIGNANCY. 2. Colon, total resection (incl lymph nodes) - COLON WITH SCATTERED TUBULAR ADENOMAS. - HIGH GRADE DYSPLASIA IS NOT IDENTIFIED. - BENIGN APPENDIX WITH FIBROUS OBLITERATION OF THE TIP. - THERE IS  NO EVIDENCE OF CARCINOMA IN 8 OF 8 LYMPH NODES (0/8). - SEE ONCOLOGY TABLE BELOW. 3. Colon, resection margin (donut), new distal margin and rectum - BENIGN SQUAMOUS LINED MUCOSA. - THERE IS NO EVIDENCE OF MALIGNANCY. Microscopic Comment 2. COLON AND RECTUM Specimen: Colon, appendix, and terminal ileum. Procedure: Total colectomy. Tumor site: N/A Specimen integrity: Intact. Macroscopic intactness of mesorectum: Complete. Macroscopic tumor perforation: N/A Invasive tumor: N/A 1 of 3 FINAL for SHAKAYA, BHULLAR ([id number]) Microscopic Comment(continued) Microscopic extension of invasive tumor: N/A Lymph-Vascular invasion: N/A Peri-neural invasion: N/A Tumor deposit(s) (discontinuous extramural extension): N/A Resection margins: Negative for adenocarcinoma. Treatment effect (neoadjuvant therapy): Present, significant. Number of lymph nodes examined- 8 ; Number positive - 0 (PLEASE NOTE: Additional tissue will be submitted in an attempt to recover more lymph nodes) Additional polyp(s): Multiple scattered tubular adenomas. Pathologic Staging: ypT0, ypN0 Ancillary studies: N/A Comments: Grossly, there is a 1.5 cm granular depressed area of mucosa present at the distal portion of the specimen. This entire area has been submitted for histologic evaluation revealing inflamed, fibrotic, and edematous colorectal mucosa, consistent with treatment effect. Adenocarcinoma is not identified. In addition, there are scattered tubular adenomas throughout the specimen. No high grade dysplasia is identified. The case was  discussed with Dr. Donell Beers on 12/15/2011. (JBK:gt, 12/15/11) Pecola Leisure MD Pathologist, Electronic Signature (Case signed 12/15/2011) Intraoper   ASSESSMENT: 47 year old female with:  #1. stage III adenocarcinoma of the rectum. Patient is now status post definitive surgery after having received neoadjuvant chemotherapy and radiation therapy. Her final pathology does not reveal any evidence of residual disease. 8 nodes were negative for metastatic disease.  #2 . Patient was begun on adjuvant chemotherapy consisting of Xeloda and Oxaliplatin. She received one cycle. This course was complicated by development of a severe dehydration requiring hospitalization.  #3 Patient and I discussed rationale doing FOLFIRI. Risks and benefits of both 5-FU leucovorin and irinotecan were discussed with the patient. She understands literature was given to her.  A total of 4 cycles will be planned.  She received her first cycle on 06/04/12 - 07/16/12.  #4patient was recently admitted for a prolonged period of time date 2 obstruction. She required intensive care admission. The obstruction has not resolved and she was discharged to home. She will be scheduled for a colostomy takedown in the next few weeks.  #5 anorexia patient is having significant issues with weight gain. I have recommended that she increase her protein intake.  PLAN: #1 patient is counseled for increased protein intake.  #2 I will see her back in a few months time for followup. In the meantime she will continue to follow with Dr. Donell Beers.  All questions were answered. The patient knows to call the clinic with any problems, questions or concerns. We can certainly see the patient much sooner if necessary.  I spent 25 minutes counseling the patient face to face. The total time spent in the appointment was 30 minutes.  Drue Second, MD Medical/Oncology [facility] [phone] (beeper) [phone] (Office)

## 2013-04-07 ENCOUNTER — Other Ambulatory Visit (INDEPENDENT_AMBULATORY_CARE_PROVIDER_SITE_OTHER): Payer: Self-pay | Admitting: General Surgery

## 2013-04-08 ENCOUNTER — Other Ambulatory Visit (INDEPENDENT_AMBULATORY_CARE_PROVIDER_SITE_OTHER): Payer: Self-pay | Admitting: General Surgery

## 2013-04-09 ENCOUNTER — Other Ambulatory Visit (INDEPENDENT_AMBULATORY_CARE_PROVIDER_SITE_OTHER): Payer: Self-pay | Admitting: General Surgery

## 2013-04-09 ENCOUNTER — Telehealth (INDEPENDENT_AMBULATORY_CARE_PROVIDER_SITE_OTHER): Payer: Self-pay | Admitting: General Surgery

## 2013-04-09 DIAGNOSIS — C20 Malignant neoplasm of rectum: Secondary | ICD-10-CM

## 2013-04-09 NOTE — Telephone Encounter (Signed)
Spoke with patients spouse  He is aware of appt at [address] office  04/16/13 at 1015 he will pick up  Contrast on 04/14/13 no solids 4 hours prior to  The procedure on 04/16/13

## 2013-04-14 ENCOUNTER — Ambulatory Visit: Payer: BC Managed Care – PPO | Admitting: Physical Therapy

## 2013-04-15 ENCOUNTER — Other Ambulatory Visit (INDEPENDENT_AMBULATORY_CARE_PROVIDER_SITE_OTHER): Payer: Self-pay

## 2013-04-15 ENCOUNTER — Ambulatory Visit
Admission: RE | Admit: 2013-04-15 | Discharge: 2013-04-15 | Disposition: A | Discharge status: Home / Self Care | Payer: BC Managed Care – PPO | Source: Ambulatory Visit | Attending: General Surgery | Admitting: General Surgery

## 2013-04-15 ENCOUNTER — Encounter (HOSPITAL_COMMUNITY): Payer: Self-pay | Admitting: Pharmacy Technician

## 2013-04-15 DIAGNOSIS — C20 Malignant neoplasm of rectum: Secondary | ICD-10-CM

## 2013-04-16 ENCOUNTER — Other Ambulatory Visit: Payer: BC Managed Care – PPO

## 2013-04-16 ENCOUNTER — Ambulatory Visit: Payer: BC Managed Care – PPO | Admitting: Physical Therapy

## 2013-04-16 NOTE — Patient Instructions (Signed)
Renee Nicholson  04/16/2013   Your procedure is scheduled on:  04/24/2013             Surgery 1200noon-155pm  Report to [facility] at     0930  AM.  Call this number if you have problems the morning of surgery: [phone]   Remember:   Do not eat food or drink liquids after midnight.   Take these medicines the morning of surgery with A SIP OF WATER:    Do not wear jewelry, make-up or nail polish.  Do not wear lotions, powders, or perfumes.  Do not shave 48 hours prior to surgery.   Do not bring valuables to the hospital.  Contacts, dentures or bridgework may not be worn into surgery.  Leave suitcase in the car. After surgery it may be brought to your room.  For patients admitted to the hospital, checkout time is 11:00 AM the day of  discharge.       SEE CHG INSTRUCTION SHEET    Please read over the following fact sheets that you were given:  coughing and deep breathing exercises, leg exercises               Failure to comply with these instructions may result in cancellation of your surgery.                Patient Signature ____________________________              Nurse Signature _____________________________

## 2013-04-17 ENCOUNTER — Ambulatory Visit
Admission: RE | Admit: 2013-04-17 | Discharge: 2013-04-17 | Disposition: A | Discharge status: Home / Self Care | Payer: BC Managed Care – PPO | Source: Ambulatory Visit | Attending: General Surgery | Admitting: General Surgery

## 2013-04-17 ENCOUNTER — Encounter (HOSPITAL_COMMUNITY)
Admission: RE | Admit: 2013-04-17 | Discharge: 2013-04-17 | Disposition: A | Discharge status: Home / Self Care | Payer: BC Managed Care – PPO | Source: Ambulatory Visit | Attending: General Surgery | Admitting: General Surgery

## 2013-04-17 ENCOUNTER — Encounter (HOSPITAL_COMMUNITY): Payer: Self-pay

## 2013-04-17 ENCOUNTER — Ambulatory Visit (HOSPITAL_COMMUNITY)
Admission: RE | Admit: 2013-04-17 | Discharge: 2013-04-17 | Disposition: A | Discharge status: Home / Self Care | Payer: BC Managed Care – PPO | Source: Ambulatory Visit | Attending: General Surgery | Admitting: General Surgery

## 2013-04-17 DIAGNOSIS — Z01818 Encounter for other preprocedural examination: Secondary | ICD-10-CM | POA: Insufficient documentation

## 2013-04-17 DIAGNOSIS — Z01812 Encounter for preprocedural laboratory examination: Secondary | ICD-10-CM | POA: Insufficient documentation

## 2013-04-17 DIAGNOSIS — C20 Malignant neoplasm of rectum: Secondary | ICD-10-CM

## 2013-04-17 LAB — BASIC METABOLIC PANEL
BUN: 16 mg/dL (ref 6–23)
Calcium: 9.7 mg/dL (ref 8.4–10.5)
Chloride: 105 mEq/L (ref 96–112)
Creatinine, Ser: 0.67 mg/dL (ref 0.50–1.10)
GFR calc Af Amer: >90 mL/min (ref >90)
GFR calc non Af Amer: >90 mL/min (ref >90)

## 2013-04-17 LAB — CBC
HCT: 37.7 % (ref 36.0–46.0)
MCHC: 33.2 g/dL (ref 30.0–36.0)
MCV: 86.9 fL (ref 78.0–100.0)
Platelets: 229 10*3/uL (ref 150–400)
RDW: 12.5 % (ref 11.5–15.5)
WBC: 5.1 10*3/uL (ref 4.0–10.5)

## 2013-04-17 MED ORDER — IOHEXOL 300 MG/ML  SOLN
70.0000 mL | Freq: Once | INTRAMUSCULAR | Status: AC | PRN
  Administered 2013-04-17: 70 mL via INTRAVENOUS

## 2013-04-17 NOTE — Progress Notes (Signed)
ECHO 01/08/13 EPIC  Last office visit with Dr Welton Flakes on 03/20/13.

## 2013-04-21 ENCOUNTER — Ambulatory Visit: Payer: BC Managed Care – PPO | Admitting: Physical Therapy

## 2013-04-22 ENCOUNTER — Ambulatory Visit (INDEPENDENT_AMBULATORY_CARE_PROVIDER_SITE_OTHER): Payer: BC Managed Care – PPO | Admitting: General Surgery

## 2013-04-22 ENCOUNTER — Encounter (INDEPENDENT_AMBULATORY_CARE_PROVIDER_SITE_OTHER): Payer: Self-pay | Admitting: General Surgery

## 2013-04-22 VITALS — BP 118/78 | HR 58 | Resp 14 | Ht <= 58 in | Wt <= 1120 oz

## 2013-04-22 DIAGNOSIS — C20 Malignant neoplasm of rectum: Secondary | ICD-10-CM

## 2013-04-22 NOTE — Patient Instructions (Signed)
Follow up for surgery on Thursday.  No bowel prep needed.    Main risks are bleeding, infection, possible need to open incision in midline.

## 2013-04-22 NOTE — Assessment & Plan Note (Signed)
Currently no evidence of disease.  Patient has surgery scheduled Thursday. I reviewed the risk of surgery including bleeding, infection, damage to adjacent structures, anastomotic leak, possible need to go through the midline incision, hernia, obstruction, watery bowel movements and/or incontinence, and blood clot.  The patient was advised that the skin of the ostomy incision would be left open due to the skin issues that she has had.

## 2013-04-22 NOTE — Progress Notes (Signed)
HISTORY: Patient is a 47-year-old female with history of rectal cancer who had significant issues with a bowel obstruction around her ileostomy site. She required reexploration for this. She is doing much better. Her prealbumin came up to 34. She is not gaining any weight but is eating.  She is doing well with physical therapy. She was able to sense the balloon and able to expel the balloon.  She is having less ostomy leakage.     PERTINENT REVIEW OF SYSTEMS: Otherwise negative x 11  Filed Vitals:   04/22/13 1154  BP: 118/78  Pulse: 58  Resp: 14   Filed Weights   04/22/13 1154  Weight: 70 lb (31.752 kg)     EXAM: Head: Normocephalic and atraumatic.  Eyes:  Conjunctivae are normal. Pupils are equal, round, and reactive to light. No scleral icterus.  Neck:  Normal range of motion. Neck supple. No tracheal deviation present. No thyromegaly present.  Resp: No respiratory distress, normal effort. Abd:  Abdomen is soft, non distended and non tender. No masses are palpable.  There is no rebound and no guarding. Ostomy appliance in place Pasty stool.   Neurological: Alert and oriented to person, place, and time. Coordination normal.  Skin: Skin is warm and dry. No rash noted. No diaphoretic. No erythema. No pallor.  Psychiatric: Normal mood and affect. Normal behavior. Judgment and thought content normal.      ASSESSMENT AND PLAN:   Rectal cancer s/p proctocolectomy, j-pouch with ileoanal anastomosis, loop ileostomy on 12/14/11 Currently no evidence of disease.  Patient has surgery scheduled Thursday. I reviewed the risk of surgery including bleeding, infection, damage to adjacent structures, anastomotic leak, possible need to go through the midline incision, hernia, obstruction, watery bowel movements and/or incontinence, and blood clot.  The patient was advised that the skin of the ostomy incision would be left open due to the skin issues that she has had.    Interpreter was  present for entire discussion.      Otho Michalik L Braden Cimo, MD Surgical Oncology, General & Endocrine Surgery [facility]  VYAS,DHRUV B., MD Vyas, Dhruv B., MD   

## 2013-04-23 ENCOUNTER — Ambulatory Visit: Payer: BC Managed Care – PPO | Admitting: Physical Therapy

## 2013-04-24 ENCOUNTER — Encounter (HOSPITAL_COMMUNITY)
Admission: RE | Disposition: A | Discharge status: Home Health | Payer: Self-pay | Source: Ambulatory Visit | Attending: General Surgery

## 2013-04-24 ENCOUNTER — Encounter (HOSPITAL_COMMUNITY): Payer: Self-pay | Admitting: *Deleted

## 2013-04-24 ENCOUNTER — Inpatient Hospital Stay (HOSPITAL_COMMUNITY)
Admission: RE | Admit: 2013-04-24 | Discharge: 2013-05-01 | DRG: 152 | Disposition: A | Discharge status: Home Health | Payer: BC Managed Care – PPO | Source: Ambulatory Visit | Attending: General Surgery | Admitting: General Surgery

## 2013-04-24 ENCOUNTER — Encounter (HOSPITAL_COMMUNITY): Payer: Self-pay | Admitting: Certified Registered"

## 2013-04-24 ENCOUNTER — Inpatient Hospital Stay (HOSPITAL_COMMUNITY): Payer: BC Managed Care – PPO | Admitting: Certified Registered"

## 2013-04-24 DIAGNOSIS — K560 Paralytic ileus: Secondary | ICD-10-CM | POA: Diagnosis present

## 2013-04-24 DIAGNOSIS — R197 Diarrhea, unspecified: Secondary | ICD-10-CM | POA: Diagnosis present

## 2013-04-24 DIAGNOSIS — K929 Disease of digestive system, unspecified: Secondary | ICD-10-CM | POA: Diagnosis present

## 2013-04-24 DIAGNOSIS — Y838 Other surgical procedures as the cause of abnormal reaction of the patient, or of later complication, without mention of misadventure at the time of the procedure: Secondary | ICD-10-CM | POA: Diagnosis present

## 2013-04-24 DIAGNOSIS — Z432 Encounter for attention to ileostomy: Principal | ICD-10-CM

## 2013-04-24 DIAGNOSIS — E43 Unspecified severe protein-calorie malnutrition: Secondary | ICD-10-CM | POA: Insufficient documentation

## 2013-04-24 DIAGNOSIS — K9402 Colostomy infection: Secondary | ICD-10-CM | POA: Diagnosis present

## 2013-04-24 DIAGNOSIS — L02219 Cutaneous abscess of trunk, unspecified: Secondary | ICD-10-CM | POA: Diagnosis present

## 2013-04-24 DIAGNOSIS — C20 Malignant neoplasm of rectum: Secondary | ICD-10-CM | POA: Diagnosis present

## 2013-04-24 DIAGNOSIS — B372 Candidiasis of skin and nail: Secondary | ICD-10-CM | POA: Diagnosis present

## 2013-04-24 DIAGNOSIS — IMO0002 Reserved for concepts with insufficient information to code with codable children: Secondary | ICD-10-CM | POA: Diagnosis present

## 2013-04-24 HISTORY — PX: ILEOSTOMY CLOSURE: SHX1784

## 2013-04-24 LAB — CBC
HCT: 35.3 % — ABNORMAL LOW (ref 36.0–46.0)
Hemoglobin: 12.1 g/dL (ref 12.0–15.0)
RBC: 4.1 MIL/uL (ref 3.87–5.11)

## 2013-04-24 LAB — CREATININE, SERUM
Creatinine, Ser: 0.72 mg/dL (ref 0.50–1.10)
GFR calc non Af Amer: >90 mL/min (ref >90)

## 2013-04-24 LAB — TYPE AND SCREEN: ABO/RH(D): B POS

## 2013-04-24 SURGERY — CLOSURE, ILEOSTOMY
Anesthesia: General | Site: Abdomen | Wound class: Dirty or Infected

## 2013-04-24 MED ORDER — PROMETHAZINE HCL 25 MG/ML IJ SOLN
6.2500 mg | Freq: Four times a day (QID) | INTRAMUSCULAR | Status: DC | PRN
  Administered 2013-04-24: 6.25 mg via INTRAVENOUS
  Filled 2013-04-24: qty 1

## 2013-04-24 MED ORDER — PROPOFOL 10 MG/ML IV BOLUS
INTRAVENOUS | Status: DC | PRN
  Administered 2013-04-24: 120 mg via INTRAVENOUS

## 2013-04-24 MED ORDER — ACETAMINOPHEN 500 MG PO TABS
1000.0000 mg | ORAL_TABLET | Freq: Four times a day (QID) | ORAL | Status: AC
  Administered 2013-04-25: 1000 mg via ORAL
  Filled 2013-04-24 (×2): qty 2

## 2013-04-24 MED ORDER — ENOXAPARIN SODIUM 40 MG/0.4ML ~~LOC~~ SOLN
40.0000 mg | SUBCUTANEOUS | Status: DC
  Administered 2013-04-25 – 2013-04-27 (×3): 40 mg via SUBCUTANEOUS
  Filled 2013-04-24 (×6): qty 0.4

## 2013-04-24 MED ORDER — ONDANSETRON HCL 4 MG/2ML IJ SOLN: 4.0000 mg | Freq: Four times a day (QID) | INTRAMUSCULAR | Status: DC | PRN

## 2013-04-24 MED ORDER — KCL IN DEXTROSE-NACL 20-5-0.45 MEQ/L-%-% IV SOLN
INTRAVENOUS | Status: DC
  Administered 2013-04-24: 1000 mL via INTRAVENOUS
  Administered 2013-04-25 – 2013-04-27 (×3): via INTRAVENOUS
  Filled 2013-04-24 (×6): qty 1000

## 2013-04-24 MED ORDER — MIDAZOLAM HCL 5 MG/5ML IJ SOLN
INTRAMUSCULAR | Status: DC | PRN
  Administered 2013-04-24: 2 mg via INTRAVENOUS

## 2013-04-24 MED ORDER — ONDANSETRON HCL 4 MG/2ML IJ SOLN
INTRAMUSCULAR | Status: DC | PRN
  Administered 2013-04-24: 4 mg via INTRAVENOUS

## 2013-04-24 MED ORDER — NEOSTIGMINE METHYLSULFATE 1 MG/ML IJ SOLN
INTRAMUSCULAR | Status: DC | PRN
  Administered 2013-04-24: 2.5 mg via INTRAVENOUS

## 2013-04-24 MED ORDER — DIPHENHYDRAMINE HCL 12.5 MG/5ML PO ELIX: 12.5000 mg | ORAL_SOLUTION | Freq: Four times a day (QID) | ORAL | Status: DC | PRN

## 2013-04-24 MED ORDER — CHLORHEXIDINE GLUCONATE 4 % EX LIQD
1.0000 "application " | Freq: Once | CUTANEOUS | Status: DC
  Filled 2013-04-24: qty 15

## 2013-04-24 MED ORDER — OXYCODONE-ACETAMINOPHEN 5-325 MG PO TABS: 1.0000 | ORAL_TABLET | ORAL | Status: DC | PRN

## 2013-04-24 MED ORDER — PROMETHAZINE HCL 25 MG/ML IJ SOLN: 6.2500 mg | INTRAMUSCULAR | Status: DC | PRN

## 2013-04-24 MED ORDER — KETOROLAC TROMETHAMINE 15 MG/ML IJ SOLN
15.0000 mg | Freq: Four times a day (QID) | INTRAMUSCULAR | Status: DC | PRN
  Administered 2013-04-27: 15 mg via INTRAVENOUS
  Filled 2013-04-24: qty 1

## 2013-04-24 MED ORDER — MORPHINE SULFATE (PF) 1 MG/ML IV SOLN
INTRAVENOUS | Status: DC
  Administered 2013-04-24: 1 mg via INTRAVENOUS
  Administered 2013-04-24: 6 mg via INTRAVENOUS
  Administered 2013-04-25: 1.5 mg via INTRAVENOUS
  Administered 2013-04-25: 6 mg via INTRAVENOUS
  Administered 2013-04-25: 1.5 mg via INTRAVENOUS
  Administered 2013-04-25: 3 mg via INTRAVENOUS
  Administered 2013-04-26: 1.5 mg via INTRAVENOUS
  Administered 2013-04-26: 09:00:00 via INTRAVENOUS
  Administered 2013-04-26: 1.48 mg via INTRAVENOUS
  Administered 2013-04-26: 4.5 mg via INTRAVENOUS
  Administered 2013-04-27 (×2): 1.5 mg via INTRAVENOUS
  Administered 2013-04-27: 6 mg via INTRAVENOUS
  Administered 2013-04-28 (×2): 1.5 mg via INTRAVENOUS
  Filled 2013-04-24: qty 25

## 2013-04-24 MED ORDER — ALVIMOPAN 12 MG PO CAPS
12.0000 mg | ORAL_CAPSULE | Freq: Two times a day (BID) | ORAL | Status: DC
  Administered 2013-04-25 – 2013-04-26 (×3): 12 mg via ORAL
  Filled 2013-04-24 (×4): qty 1

## 2013-04-24 MED ORDER — LEVOFLOXACIN 500 MG PO TABS
500.0000 mg | ORAL_TABLET | Freq: Every day | ORAL | Status: AC
  Administered 2013-04-24 – 2013-04-25 (×2): 500 mg via ORAL
  Filled 2013-04-24 (×2): qty 1

## 2013-04-24 MED ORDER — BUPIVACAINE-EPINEPHRINE 0.25% -1:200000 IJ SOLN
INTRAMUSCULAR | Status: DC | PRN
  Administered 2013-04-24: 47 mL

## 2013-04-24 MED ORDER — HYDROMORPHONE HCL PF 1 MG/ML IJ SOLN
0.2500 mg | INTRAMUSCULAR | Status: DC | PRN
  Administered 2013-04-24: 0.5 mg via INTRAVENOUS

## 2013-04-24 MED ORDER — LACTATED RINGERS IV SOLN: INTRAVENOUS | Status: DC

## 2013-04-24 MED ORDER — FENTANYL CITRATE 0.05 MG/ML IJ SOLN
INTRAMUSCULAR | Status: DC | PRN
  Administered 2013-04-24: 50 ug via INTRAVENOUS
  Administered 2013-04-24: 100 ug via INTRAVENOUS
  Administered 2013-04-24 (×2): 50 ug via INTRAVENOUS

## 2013-04-24 MED ORDER — EPHEDRINE SULFATE 50 MG/ML IJ SOLN
INTRAMUSCULAR | Status: DC | PRN
  Administered 2013-04-24: 10 mg via INTRAVENOUS
  Administered 2013-04-24: 5 mg via INTRAVENOUS

## 2013-04-24 MED ORDER — DEXTROSE 5 % IV SOLN
2.0000 g | INTRAVENOUS | Status: AC
  Administered 2013-04-24: 1 g via INTRAVENOUS
  Filled 2013-04-24: qty 2

## 2013-04-24 MED ORDER — NALOXONE HCL 0.4 MG/ML IJ SOLN: 0.4000 mg | INTRAMUSCULAR | Status: DC | PRN

## 2013-04-24 MED ORDER — SUCCINYLCHOLINE CHLORIDE 20 MG/ML IJ SOLN
INTRAMUSCULAR | Status: DC | PRN
  Administered 2013-04-24: 100 mg via INTRAVENOUS

## 2013-04-24 MED ORDER — LACTATED RINGERS IV SOLN
INTRAVENOUS | Status: DC
  Administered 2013-04-24: 11:00:00 via INTRAVENOUS

## 2013-04-24 MED ORDER — LIDOCAINE HCL (PF) 2 % IJ SOLN
INTRAMUSCULAR | Status: DC | PRN
  Administered 2013-04-24: 20 mg

## 2013-04-24 MED ORDER — ROCURONIUM BROMIDE 100 MG/10ML IV SOLN
INTRAVENOUS | Status: DC | PRN
  Administered 2013-04-24: 20 mg via INTRAVENOUS

## 2013-04-24 MED ORDER — ONDANSETRON HCL 4 MG PO TABS: 4.0000 mg | ORAL_TABLET | Freq: Four times a day (QID) | ORAL | Status: DC | PRN

## 2013-04-24 MED ORDER — FLUCONAZOLE 100 MG PO TABS
100.0000 mg | ORAL_TABLET | Freq: Every day | ORAL | Status: AC
  Administered 2013-04-24 – 2013-04-25 (×2): 100 mg via ORAL
  Filled 2013-04-24 (×2): qty 1

## 2013-04-24 MED ORDER — MEPERIDINE HCL 50 MG/ML IJ SOLN: 6.2500 mg | INTRAMUSCULAR | Status: DC | PRN

## 2013-04-24 MED ORDER — SODIUM CHLORIDE 0.9 % IJ SOLN
INTRAMUSCULAR | Status: DC | PRN
  Administered 2013-04-24: 47 mL via INTRAVENOUS

## 2013-04-24 MED ORDER — ALVIMOPAN 12 MG PO CAPS
12.0000 mg | ORAL_CAPSULE | Freq: Once | ORAL | Status: AC
  Administered 2013-04-24: 12 mg via ORAL
  Filled 2013-04-24: qty 1

## 2013-04-24 MED ORDER — SODIUM CHLORIDE 0.9 % IJ SOLN: 9.0000 mL | INTRAMUSCULAR | Status: DC | PRN

## 2013-04-24 MED ORDER — MORPHINE SULFATE 10 MG/ML IJ SOLN: 1.0000 mg | INTRAMUSCULAR | Status: DC | PRN

## 2013-04-24 MED ORDER — GLYCOPYRROLATE 0.2 MG/ML IJ SOLN
INTRAMUSCULAR | Status: DC | PRN
  Administered 2013-04-24: 0.4 mg via INTRAVENOUS

## 2013-04-24 MED ORDER — KETOROLAC TROMETHAMINE 15 MG/ML IJ SOLN
15.0000 mg | Freq: Four times a day (QID) | INTRAMUSCULAR | Status: AC
  Administered 2013-04-24 – 2013-04-26 (×8): 15 mg via INTRAVENOUS
  Filled 2013-04-24 (×9): qty 1

## 2013-04-24 MED ORDER — ONDANSETRON HCL 4 MG/2ML IJ SOLN
4.0000 mg | Freq: Four times a day (QID) | INTRAMUSCULAR | Status: DC | PRN
  Administered 2013-04-24 – 2013-04-29 (×5): 4 mg via INTRAVENOUS
  Filled 2013-04-24 (×5): qty 2

## 2013-04-24 MED ORDER — DIPHENHYDRAMINE HCL 50 MG/ML IJ SOLN: 12.5000 mg | Freq: Four times a day (QID) | INTRAMUSCULAR | Status: DC | PRN

## 2013-04-24 SURGICAL SUPPLY — 54 items
APPLICATOR COTTON TIP 6IN STRL (MISCELLANEOUS) ×2 IMPLANT
BLADE EXTENDED COATED 6.5IN (ELECTRODE) IMPLANT
BLADE HEX COATED 2.75 (ELECTRODE) ×2 IMPLANT
BLADE SURG SZ10 CARB STEEL (BLADE) ×2 IMPLANT
CANISTER SUCTION 2500CC (MISCELLANEOUS) ×2 IMPLANT
CHLORAPREP W/TINT 26ML (MISCELLANEOUS) ×2 IMPLANT
CLIP TI LARGE 6 (CLIP) IMPLANT
CLOTH BEACON ORANGE TIMEOUT ST (SAFETY) ×2 IMPLANT
COVER MAYO STAND STRL (DRAPES) ×2 IMPLANT
DRAPE LAPAROSCOPIC ABDOMINAL (DRAPES) ×2 IMPLANT
DRAPE LG THREE QUARTER DISP (DRAPES) IMPLANT
DRAPE WARM FLUID 44X44 (DRAPE) ×2 IMPLANT
DRSG PAD ABDOMINAL 8X10 ST (GAUZE/BANDAGES/DRESSINGS) ×2 IMPLANT
ELECT REM PT RETURN 9FT ADLT (ELECTROSURGICAL) ×2
ELECTRODE REM PT RTRN 9FT ADLT (ELECTROSURGICAL) ×1 IMPLANT
GAUZE XEROFORM 5X9 LF (GAUZE/BANDAGES/DRESSINGS) ×2 IMPLANT
GLOVE BIOGEL PI IND STRL 7.0 (GLOVE) ×1 IMPLANT
GLOVE BIOGEL PI INDICATOR 7.0 (GLOVE) ×1
GLOVE ECLIPSE 8.0 STRL XLNG CF (GLOVE) ×2 IMPLANT
GLOVE INDICATOR 8.0 STRL GRN (GLOVE) ×2 IMPLANT
GLOVE SURG SS PI 7.0 STRL IVOR (GLOVE) ×2 IMPLANT
GOWN STRL NON-REIN LRG LVL3 (GOWN DISPOSABLE) ×2 IMPLANT
GOWN STRL REIN XL XLG (GOWN DISPOSABLE) IMPLANT
KIT BASIN OR (CUSTOM PROCEDURE TRAY) IMPLANT
LEGGING LITHOTOMY PAIR STRL (DRAPES) ×2 IMPLANT
NS IRRIG 1000ML POUR BTL (IV SOLUTION) ×4 IMPLANT
PACK GENERAL/GYN (CUSTOM PROCEDURE TRAY) ×2 IMPLANT
SCALPEL HARMONIC ACE (MISCELLANEOUS) IMPLANT
SPONGE GAUZE 4X4 12PLY (GAUZE/BANDAGES/DRESSINGS) ×2 IMPLANT
STAPLER GUN LINEAR PROX 60 (STAPLE) ×2 IMPLANT
STAPLER PROXIMATE 75MM BLUE (STAPLE) ×2 IMPLANT
STAPLER VISISTAT 35W (STAPLE) ×2 IMPLANT
SUCTION POOLE TIP (SUCTIONS) ×2 IMPLANT
SUT CHROMIC 2 0 SH (SUTURE) IMPLANT
SUT NOV 1 T60/GS (SUTURE) IMPLANT
SUT NOVA 1 T20/GS 25DT (SUTURE) ×4 IMPLANT
SUT NOVA NAB DX-16 0-1 5-0 T12 (SUTURE) IMPLANT
SUT NOVA T20/GS 25 (SUTURE) IMPLANT
SUT PDS AB 1 CTX 36 (SUTURE) IMPLANT
SUT PROLENE 2 0 KS (SUTURE) ×2 IMPLANT
SUT SILK 2 0 (SUTURE) ×1
SUT SILK 2 0 SH CR/8 (SUTURE) ×2 IMPLANT
SUT SILK 2 0SH CR/8 30 (SUTURE) IMPLANT
SUT SILK 2-0 18XBRD TIE 12 (SUTURE) ×1 IMPLANT
SUT SILK 2-0 30XBRD TIE 12 (SUTURE) IMPLANT
SUT SILK 3 0 (SUTURE) ×1
SUT SILK 3 0 SH CR/8 (SUTURE) ×2 IMPLANT
SUT SILK 3-0 18XBRD TIE 12 (SUTURE) ×1 IMPLANT
SUT VIC AB 3-0 SH 18 (SUTURE) ×2 IMPLANT
TAPE CLOTH SURG 4X10 WHT LF (GAUZE/BANDAGES/DRESSINGS) ×2 IMPLANT
TOWEL OR 17X26 10 PK STRL BLUE (TOWEL DISPOSABLE) ×4 IMPLANT
TRAY FOLEY CATH 14FRSI W/METER (CATHETERS) ×2 IMPLANT
WATER STERILE IRR 1500ML POUR (IV SOLUTION) IMPLANT
YANKAUER SUCT BULB TIP NO VENT (SUCTIONS) ×2 IMPLANT

## 2013-04-24 NOTE — H&P (View-Only) (Signed)
HISTORY: Patient is a 47 year old female with history of rectal cancer who had significant issues with a bowel obstruction around her ileostomy site. She required reexploration for this. She is doing much better. Her prealbumin came up to 34. She is not gaining any weight but is eating.  She is doing well with physical therapy. She was able to sense the balloon and able to expel the balloon.  She is having less ostomy leakage.     PERTINENT REVIEW OF SYSTEMS: Otherwise negative x 11  Filed Vitals:   04/22/13 1154  BP: 118/78  Pulse: 58  Resp: 14   Filed Weights   04/22/13 1154  Weight: 70 lb (31.752 kg)     EXAM: Head: Normocephalic and atraumatic.  Eyes:  Conjunctivae are normal. Pupils are equal, round, and reactive to light. No scleral icterus.  Neck:  Normal range of motion. Neck supple. No tracheal deviation present. No thyromegaly present.  Resp: No respiratory distress, normal effort. Abd:  Abdomen is soft, non distended and non tender. No masses are palpable.  There is no rebound and no guarding. Ostomy appliance in place Pasty stool.   Neurological: Alert and oriented to person, place, and time. Coordination normal.  Skin: Skin is warm and dry. No rash noted. No diaphoretic. No erythema. No pallor.  Psychiatric: Normal mood and affect. Normal behavior. Judgment and thought content normal.      ASSESSMENT AND PLAN:   Rectal cancer s/p proctocolectomy, j-pouch with ileoanal anastomosis, loop ileostomy on 12/14/11 Currently no evidence of disease.  Patient has surgery scheduled Thursday. I reviewed the risk of surgery including bleeding, infection, damage to adjacent structures, anastomotic leak, possible need to go through the midline incision, hernia, obstruction, watery bowel movements and/or incontinence, and blood clot.  The patient was advised that the skin of the ostomy incision would be left open due to the skin issues that she has had.    Interpreter was  present for entire discussion.      Maudry Diego, MD Surgical Oncology, General & Endocrine Surgery [facility]  [name], MD [name], MD

## 2013-04-24 NOTE — Anesthesia Postprocedure Evaluation (Signed)
  Anesthesia Post-op Note  Patient: Renee Nicholson  Procedure(s) Performed: Procedure(s) (LRB): ILEOSTOMY TAKEDOWN (N/A)  Patient Location: PACU  Anesthesia Type: General  Level of Consciousness: awake and alert   Airway and Oxygen Therapy: Patient Spontanous Breathing  Post-op Pain: mild  Post-op Assessment: Post-op Vital signs reviewed, Patient's Cardiovascular Status Stable, Respiratory Function Stable, Patent Airway and No signs of Nausea or vomiting  Last Vitals:  Filed Vitals:   04/24/13 1345  BP: 133/74  Pulse: 59  Temp: 36.4 C  Resp: 11    Post-op Vital Signs: stable   Complications: No apparent anesthesia complications

## 2013-04-24 NOTE — Anesthesia Preprocedure Evaluation (Signed)

## 2013-04-24 NOTE — Interval H&P Note (Signed)
History and Physical Interval Note:  04/24/2013 11:07 AM  Renee Nicholson  has presented today for surgery, with the diagnosis of rectal  cancer   The various methods of treatment have been discussed with the patient and family. After consideration of risks, benefits and other options for treatment, the patient has consented to  Procedure(s): ILEOSTOMY TAKEDOWN (N/A) as a surgical intervention .  The patient's history has been reviewed, patient examined, no change in status, stable for surgery.  I have reviewed the patient's chart and labs.  Questions were answered to the patient's satisfaction.     Armilda Vanderlinden

## 2013-04-24 NOTE — Progress Notes (Signed)
Spoke to Dr. Donell Beers informed patient given zofran 4mg  IV an hour ago and having hiccups and dry heaves, orders given for phenergan.

## 2013-04-24 NOTE — Op Note (Signed)
    PRE-OPERATIVE DIAGNOSIS: Rectal cancer   POST-OPERATIVE DIAGNOSIS: Same   PROCEDURE: Procedure(s):  Ileostomy takedown, small bowel resection   SURGEON: Surgeon(s):  [name], MD   ASSISTANT: Abigail Miyamoto, MD  ANESTHESIA: local and general   DRAINS: none   LOCAL MEDICATIONS USED: MARCAINE with epinephrine  SPECIMEN: Source of Specimen: ileostomy   DISPOSITION OF SPECIMEN: PATHOLOGY   COUNTS: YES   PLAN OF CARE: Admit to inpatient   PATIENT DISPOSITION: PACU - hemodynamically stable.   Procedure:  Patient was identified in the holding area and taken to the operating room where she was placed supine on the operating room table. General anesthesia was induced and a Foley catheter was placed. Her stoma bag was removed and the ileostomy was closed with 2-0 Prolene. The patient's abdomen was prepped and draped in standard fashion. Timeout was performed according to the surgical safety checklist. When all was correct we continued.   The peristomal skin was injected with marcaine/saline mixture.  The skin around the stoma site was incised with a #10 blade. The Allis was used to elevate the peristomal skin. Cautery was used to take down the peristomal fat. The tonsil was used to identify the dissection plane between the bowel and the peristernal fat. The muscle edge was identified and separated from the bowel. Once the bowel was really separated from the muscle and the margins underneath the muscle were freed up, the bowel was dissected from the mesentery. A 2-0 vicryl stay suture was placed near the fascia on the bowel. The bowel was opened up with the cautery. The GIA-75 was used to create a side-to-side functional end-to-end small bowel anastomosis. The ileostomy site was resected with the TA 60. This also was used to close the defect in the anastomosis. A Tresa Endo was used to clamp the mesentery. A 2-0 silk suture was used to ligate the mesentery.  This was allowed to fall back  into the abdomen.  The muscle defect was closed with interrupted #1 Novafil pops sutures. There was no residual defect. Local anesthetic was injected into the muscle. The ileostomy site was irrigated copiously. Due to the cellulitis in place, the wound was packed with betadine soaked gauze and left open.  Xeroform was placed over the raw skin.   The patient was awakened from anesthesia and taken to the recovery room in stable condition. Needle and sponge counts were correct x 2.

## 2013-04-24 NOTE — Transfer of Care (Signed)
Immediate Anesthesia Transfer of Care Note  Patient: Renee Nicholson  Procedure(s) Performed: Procedure(s) (LRB): ILEOSTOMY TAKEDOWN (N/A)  Patient Location: PACU  Anesthesia Type: General  Level of Consciousness: sedated, patient cooperative and responds to stimulaton  Airway & Oxygen Therapy: Patient Spontanous Breathing and Patient connected to face mask oxgen  Post-op Assessment: Report given to PACU RN and Post -op Vital signs reviewed and stable  Post vital signs: Reviewed and stable  Complications: No apparent anesthesia complications

## 2013-04-24 NOTE — Preoperative (Signed)
Beta Blockers   Reason not to administer Beta Blockers:Not Applicable 

## 2013-04-25 ENCOUNTER — Encounter (HOSPITAL_COMMUNITY): Payer: Self-pay | Admitting: General Surgery

## 2013-04-25 DIAGNOSIS — E43 Unspecified severe protein-calorie malnutrition: Secondary | ICD-10-CM | POA: Insufficient documentation

## 2013-04-25 LAB — CBC
HCT: 31.5 % — ABNORMAL LOW (ref 36.0–46.0)
MCH: 29.8 pg (ref 26.0–34.0)
MCHC: 34.9 g/dL (ref 30.0–36.0)
MCV: 85.4 fL (ref 78.0–100.0)
Platelets: 175 10*3/uL (ref 150–400)
RDW: 12.5 % (ref 11.5–15.5)

## 2013-04-25 LAB — BASIC METABOLIC PANEL
BUN: 15 mg/dL (ref 6–23)
Calcium: 9 mg/dL (ref 8.4–10.5)
Creatinine, Ser: 0.74 mg/dL (ref 0.50–1.10)
GFR calc Af Amer: >90 mL/min (ref >90)
GFR calc non Af Amer: >90 mL/min (ref >90)

## 2013-04-25 MED ORDER — SIMETHICONE 40 MG/0.6ML PO SUSP
40.0000 mg | Freq: Four times a day (QID) | ORAL | Status: DC | PRN
  Administered 2013-04-25 – 2013-04-29 (×6): 40 mg via ORAL
  Filled 2013-04-25 (×6): qty 0.6

## 2013-04-25 MED ORDER — BOOST / RESOURCE BREEZE PO LIQD
1.0000 | Freq: Three times a day (TID) | ORAL | Status: DC
  Administered 2013-04-25 – 2013-04-30 (×12): 1 via ORAL

## 2013-04-25 NOTE — Progress Notes (Signed)
1 Day Post-Op  Subjective: No issues with pain, nausea.    Objective: Vital signs in last 24 hours: Temp:  [97.3 F (36.3 C)-97.8 F (36.6 C)] 97.3 F (36.3 C) (08/01 1000) Pulse Rate:  [52-61] 61 (08/01 1000) Resp:  [9-19] 17 (08/01 1000) BP: (105-139)/(54-74) 105/64 mmHg (08/01 1000) SpO2:  [100 %] 100 % (08/01 1000) Weight:  [70 lb (31.752 kg)] 70 lb (31.752 kg) (07/31 1500) Last BM Date: 04/24/13  Intake/Output from previous day: 07/31 0701 - 08/01 0700 In: 2208.8 [P.O.:240; I.V.:1968.8] Out: 1060 [Urine:1060] Intake/Output this shift: Total I/O In: 420 [P.O.:120; I.V.:300] Out: 450 [Urine:450]  General appearance: alert, cooperative and no distress Resp: breathing comfortably GI: sfot, non distended, approp tender  Lab Results:   Recent Labs  04/24/13 1451 04/25/13 0351  WBC 10.9* 8.6  HGB 12.1 11.0*  HCT 35.3* 31.5*  PLT 201 175   BMET  Recent Labs  04/24/13 1451 04/25/13 0351  NA  --  132*  K  --  4.3  CL  --  98  CO2  --  24  GLUCOSE  --  125*  BUN  --  15  CREATININE 0.72 0.74  CALCIUM  --  9.0   PT/INR No results found for this basename: LABPROT, INR,  in the last 72 hours ABG No results found for this basename: PHART, PCO2, PO2, HCO3,  in the last 72 hours  Studies/Results: No results found.  Anti-infectives: Anti-infectives   Start     Dose/Rate Route Frequency Ordered Stop   04/24/13 1600  fluconazole (DIFLUCAN) tablet 100 mg     100 mg Oral Daily 04/24/13 1411 04/25/13 1112   04/24/13 1600  levofloxacin (LEVAQUIN) tablet 500 mg     500 mg Oral Daily 04/24/13 1411 04/25/13 1111   04/24/13 1007  cefOXitin (MEFOXIN) 2 g in dextrose 5 % 50 mL IVPB     2 g 100 mL/hr over 30 Minutes Intravenous On call to O.R. 04/24/13 1007 04/24/13 1137      Assessment/Plan: s/p Procedure(s): ILEOSTOMY TAKEDOWN (N/A) stay on clears. PCA Ambulate   LOS: 1 day    [facility] 04/25/2013

## 2013-04-25 NOTE — Progress Notes (Signed)
INITIAL NUTRITION ASSESSMENT  Pt meets criteria for severe MALNUTRITION in the context of chronic illness as evidenced by severe muscle wasting and subcutaneous fat loss in temporal/orbital, clavicle, upper arm, and hand regions with 28.6% weight loss in the past year per pt and husband.   DOCUMENTATION CODES Per approved criteria  -Severe malnutrition in the context of chronic illness -Underweight   INTERVENTION: - Diet advancement per MD - Resource Breeze TID - Will continue to monitor   NUTRITION DIAGNOSIS: Inadequate oral intake related to clear liquid diet as evidenced by diet order.   Goal: Advance diet as tolerated to regular diet  Monitor:  Weights, labs, diet advancement   Reason for Assessment: Nutrition risk   47 y.o. female  Admitting Dx: Planned surgery for ileostomy takedown, small bowel resection   ASSESSMENT: Pt with rectal cancer s/p proctocolectomy, j-pouch with ileoanal anastomosis, loop ileostomy on 12/14/11. POD#1 ileostomy takedown, small bowel resection. Pt weighs 70 pounds.   Met with pt and husband who report pt eating 2 meals/day at home and drinking 1 Resource Breeze/day. Reports eating 2 eggs, bread, and milk for breakfast and has rice, milk, and a sandwich for dinner. States she uses a protein powder she mixes with water as well for additional protein, unsure of amount of grams of protein in serving. Pt reports 17-18 pound unintended weight loss in the past year r/t not feeling well from chemotherapy and problems with ileostomy. Noted pt with slightly low sodium. Pt with severe muscle wasting and subcutaneous fat loss in temporal/orbital, clavicle, upper arm, and hand regions.    Height: Ht Readings from Last 1 Encounters:  04/24/13 4\' 9"  (1.448 m)    Weight: Wt Readings from Last 1 Encounters:  04/24/13 70 lb (31.752 kg)    Ideal Body Weight: 95 lb   % Ideal Body Weight: 74%  Wt Readings from Last 10 Encounters:  04/24/13 70 lb (31.752  kg)  04/24/13 70 lb (31.752 kg)  04/22/13 70 lb (31.752 kg)  04/17/13 70 lb 9.6 oz (32.024 kg)  04/01/13 69 lb (31.298 kg)  03/20/13 71 lb 1.6 oz (32.251 kg)  03/10/13 70 lb 6.4 oz (31.933 kg)  02/07/13 70 lb (31.752 kg)  01/28/13 69 lb 6.4 oz (31.48 kg)  01/17/13 79 lb 3.2 oz (35.925 kg)    Usual Body Weight: 97-98 lb last year per pt  % Usual Body Weight: 71-72%  BMI:  Body mass index is 15.14 kg/(m^2). Underweight  Estimated Nutritional Needs: Kcal: 1150-1300 Protein: 50-60g Fluid: 1.1-1.3L/day  Skin: Abdominal incision    Diet Order: Clear Liquid  EDUCATION NEEDS: -No education needs identified at this time   Intake/Output Summary (Last 24 hours) at 04/25/13 1115 Last data filed at 04/25/13 1000  Gross per 24 hour  Intake 2328.75 ml  Output   1510 ml  Net 818.75 ml    Last BM: 7/31  Labs:   Recent Labs Lab 04/24/13 1451 04/25/13 0351  NA  --  132*  K  --  4.3  CL  --  98  CO2  --  24  BUN  --  15  CREATININE 0.72 0.74  CALCIUM  --  9.0  GLUCOSE  --  125*    CBG (last 3)  No results found for this basename: GLUCAP,  in the last 72 hours  Scheduled Meds: . acetaminophen  1,000 mg Oral Q6H  . alvimopan  12 mg Oral BID  . enoxaparin (LOVENOX) injection  40 mg Subcutaneous Q24H  .  ketorolac  15 mg Intravenous Q6H  . morphine   Intravenous Q4H    Continuous Infusions: . dextrose 5 % and 0.45 % NaCl with KCl 20 mEq/L 1,000 mL (04/24/13 1323)    Past Medical History  Diagnosis Date  . Anemia   . Diarrhea   . Bright red rectal bleeding 09/13/2011  . History of chemotherapy     completed 09/2011   . Hx of radiation therapy 08/29/11 to 10/09/11    rectum  . Ileostomy in place   . Cancer   . Rectal cancer   . Hx of tracheostomy     12/2012    Past Surgical History  Procedure Laterality Date  . Tubal ligation    . Carpal tunnel release    . Colonoscopy  07/26/2011    Procedure: COLONOSCOPY;  Surgeon: Arlyce Harman, MD;  Location: [facility]  ENDO SUITE;  Service: Endoscopy;  Laterality: N/A;  10:40  . Esophagogastroduodenoscopy  07/26/2011    Procedure: ESOPHAGOGASTRODUODENOSCOPY (EGD);  Surgeon: Arlyce Harman, MD;  Location: [facility] ENDO SUITE;  Service: Endoscopy;  Laterality: N/A;  . Esophagogastroduodenoscopy  12/27/2011    Procedure: ESOPHAGOGASTRODUODENOSCOPY (EGD);  Surgeon: Shirley Friar, MD;  Location: [facility];  Service: Endoscopy;  Laterality: N/A;  . Laparoscopic colon resection  12/14/11    diverting ileostomy  . Portacath placement  02/21/2012    Procedure: INSERTION PORT-A-CATH;  Surgeon: [name], MD;  Location: [facility] OR;  Service: General;  Laterality: N/A;  . Evaluation under anesthesia with anal fissurotomy N/A 11/06/2012    Procedure: Exam Under Anesthesia , Anal Dilation;  Surgeon: [name], MD;  Location: [facility] ORS;  Service: General;  Laterality: N/A;  Exam Under Anesthesia , Anal Dilation  . Laparotomy N/A 01/03/2013    Procedure: EXPLORATORY LAPAROTOMY WITH LYSIS OF ADHESIONS, revision of ileostomy;  Surgeon: [name], MD;  Location: [facility] OR;  Service: General;  Laterality: N/A;    Levon Hedger MS, RD, LDN [phone] Pager [phone] After Hours Pager

## 2013-04-25 NOTE — Progress Notes (Signed)
Dark red blood observed in toilet after voiding. Patient reports "passing gas". MD notified. Will continue to monitor. 'Hat' placed in toilet to collect rectal evacuation. Chevon Laufer, Eli Phillips, RN

## 2013-04-25 NOTE — Progress Notes (Signed)
CSW consulted for assistance with Interpreter services. PN reviewed and interpreter services have been utilized. CSW signing off.  Cori Razor LCSW [phone]

## 2013-04-25 NOTE — Care Management Note (Addendum)
    Page 1 of 1   04/29/2013     10:34:22 AM   CARE MANAGEMENT NOTE 04/29/2013  Patient:  Renee Nicholson, Renee Nicholson   Account Number:  [id number]  Date Initiated:  04/25/2013  Documentation initiated by:  Lorenda Ishihara  Subjective/Objective Assessment:   47 yo female admitted s/p ileostomy takedown, SBR. PTA lived at home with spouse, limited [place].     Action/Plan:   Home when stable   Anticipated DC Date:  05/01/2013   Anticipated DC Plan:  HOME W HOME HEALTH SERVICES      DC Planning Services  CM consult      [facility] Choice  HOME HEALTH   Choice offered to / List presented to:  C-3 Spouse        HH arranged  HH-1 RN      [facility] agency  [facility]   Status of service:  Completed, signed off Medicare Important Message given?   (If response is "NO", the following Medicare IM given date fields will be blank) Date Medicare IM given:   Date Additional Medicare IM given:    Discharge Disposition:  HOME W HOME HEALTH SERVICES  Per UR Regulation:  Reviewed for med. necessity/level of care/duration of stay  If discussed at Long Length of Stay Meetings, dates discussed:    Comments:

## 2013-04-26 LAB — CBC
HCT: 33 % — ABNORMAL LOW (ref 36.0–46.0)
MCHC: 34.2 g/dL (ref 30.0–36.0)
Platelets: 187 10*3/uL (ref 150–400)
RDW: 12.9 % (ref 11.5–15.5)

## 2013-04-26 LAB — BASIC METABOLIC PANEL
BUN: 10 mg/dL (ref 6–23)
GFR calc Af Amer: >90 mL/min (ref >90)
GFR calc non Af Amer: 82 mL/min — ABNORMAL LOW (ref >90)
Potassium: 3.8 mEq/L (ref 3.5–5.1)
Sodium: 132 mEq/L — ABNORMAL LOW (ref 135–145)

## 2013-04-26 MED ORDER — DIPHENOXYLATE-ATROPINE 2.5-0.025 MG PO TABS
1.0000 | ORAL_TABLET | Freq: Four times a day (QID) | ORAL | Status: DC
  Administered 2013-04-26 – 2013-04-28 (×12): 1 via ORAL
  Filled 2013-04-26 (×12): qty 1

## 2013-04-26 MED ORDER — BOOST PLUS PO LIQD
237.0000 mL | Freq: Three times a day (TID) | ORAL | Status: DC
  Administered 2013-04-26: 237 mL via ORAL
  Filled 2013-04-26 (×10): qty 237

## 2013-04-26 NOTE — Progress Notes (Signed)
Pt would like her left port accessed for morning labs so she does not have to be stuck.  Renee Nicholson

## 2013-04-26 NOTE — Progress Notes (Signed)
Patient ID: Renee Nicholson, female   DOB: 1966/06/05, 46 y.o.   MRN: [id number] 2 Days Post-Op  Subjective: Concerned about frequent diarrhea. Up-and-down frequently to the bathroom. Tolerating liquid diet without nausea. Minimal nominal discomfort.  Objective: Vital signs in last 24 hours: Temp:  [97.3 F (36.3 C)-98.7 F (37.1 C)] 97.3 F (36.3 C) (08/02 0535) Pulse Rate:  [61-67] 65 (08/02 0535) Resp:  [11-18] 18 (08/02 0535) BP: (101-120)/(63-78) 119/78 mmHg (08/02 0535) SpO2:  [99 %-100 %] 100 % (08/02 0535) Last BM Date: 04/24/13  Intake/Output from previous day: 08/01 0701 - 08/02 0700 In: 2160 [P.O.:360; I.V.:1800] Out: 2450 [Urine:2200; Stool:250] Intake/Output this shift:    General appearance: alert, cooperative and mildly anxious GI: normal findings: soft, non-tender and mild distention Incision/Wound: dressing just changed, clean and dry  Lab Results:   Recent Labs  04/25/13 0351 04/26/13 0453  WBC 8.6 6.8  HGB 11.0* 11.3*  HCT 31.5* 33.0*  PLT 175 187   BMET  Recent Labs  04/25/13 0351 04/26/13 0453  NA 132* 132*  K 4.3 3.8  CL 98 100  CO2 24 24  GLUCOSE 125* 123*  BUN 15 10  CREATININE 0.74 0.83  CALCIUM 9.0 8.9     Studies/Results: No results found.  Anti-infectives: Anti-infectives   Start     Dose/Rate Route Frequency Ordered Stop   04/24/13 1600  fluconazole (DIFLUCAN) tablet 100 mg     100 mg Oral Daily 04/24/13 1411 04/25/13 1112   04/24/13 1600  levofloxacin (LEVAQUIN) tablet 500 mg     500 mg Oral Daily 04/24/13 1411 04/25/13 1111   04/24/13 1007  cefOXitin (MEFOXIN) 2 g in dextrose 5 % 50 mL IVPB     2 g 100 mL/hr over 30 Minutes Intravenous On call to O.R. 04/24/13 1007 04/24/13 1137      Assessment/Plan: s/p Procedure(s): ILEOSTOMY TAKEDOWN Frequent diarrhea. Start Lomotil. Advanced to full liquid diet.   LOS: 2 days    Wiley Magan T 04/26/2013

## 2013-04-27 LAB — CBC
Hemoglobin: 11 g/dL — ABNORMAL LOW (ref 12.0–15.0)
MCHC: 34.7 g/dL (ref 30.0–36.0)
WBC: 7.3 10*3/uL (ref 4.0–10.5)

## 2013-04-27 LAB — BASIC METABOLIC PANEL
Chloride: 98 mEq/L (ref 96–112)
GFR calc Af Amer: >90 mL/min (ref >90)
GFR calc non Af Amer: >90 mL/min (ref >90)
Glucose, Bld: 135 mg/dL — ABNORMAL HIGH (ref 70–99)
Potassium: 4 mEq/L (ref 3.5–5.1)
Sodium: 131 mEq/L — ABNORMAL LOW (ref 135–145)

## 2013-04-27 MED ORDER — MEGESTROL ACETATE 40 MG/ML PO SUSP
400.0000 mg | Freq: Every day | ORAL | Status: DC
  Administered 2013-04-27 – 2013-05-01 (×5): 400 mg via ORAL
  Filled 2013-04-27 (×6): qty 10

## 2013-04-27 MED ORDER — OXYCODONE-ACETAMINOPHEN 5-325 MG/5ML PO SOLN
10.0000 mL | ORAL | Status: DC | PRN
  Administered 2013-04-29: 5 mL via ORAL
  Filled 2013-04-27: qty 5
  Filled 2013-04-27: qty 10

## 2013-04-27 MED ORDER — SODIUM CHLORIDE 0.9 % IJ SOLN
10.0000 mL | INTRAMUSCULAR | Status: DC | PRN
  Administered 2013-05-01: 10 mL

## 2013-04-27 MED ORDER — KCL IN DEXTROSE-NACL 20-5-0.9 MEQ/L-%-% IV SOLN
INTRAVENOUS | Status: DC
  Administered 2013-04-27 (×2): via INTRAVENOUS
  Filled 2013-04-27 (×4): qty 1000

## 2013-04-27 NOTE — Progress Notes (Signed)
Patient ID: Renee Nicholson, female   DOB: 07-Oct-1965, 47 y.o.   MRN: [id number] 3 Days Post-Op  Subjective: Concerned about some "gas" abdominal bloating. She is having bowel movements which are less frequent on Lomotil. Mild abdominal pain controlled with medications. Tolerating her liquid diet but she does not like it and wants to try solid food.  Objective: Vital signs in last 24 hours: Temp:  [97.5 F (36.4 C)-98.8 F (37.1 C)] 98.8 F (37.1 C) (08/03 0430) Pulse Rate:  [74-80] 74 (08/03 0430) Resp:  [12-18] 16 (08/03 0430) BP: (97-122)/(62-79) 122/79 mmHg (08/03 0430) SpO2:  [98 %-100 %] 100 % (08/03 0430) Last BM Date: 04/26/13  Intake/Output from previous day: 08/02 0701 - 08/03 0700 In: 2260 [P.O.:460; I.V.:1800] Out: 1450 [Urine:1450] Intake/Output this shift:    General appearance: alert, cooperative and no distress GI: minimal distention, soft and nontender Incision/Wound: ileostomy site packed and cleaned  Lab Results:   Recent Labs  04/26/13 0453 04/27/13 0350  WBC 6.8 7.3  HGB 11.3* 11.0*  HCT 33.0* 31.7*  PLT 187 211   BMET  Recent Labs  04/26/13 0453 04/27/13 0350  NA 132* 131*  K 3.8 4.0  CL 100 98  CO2 24 22  GLUCOSE 123* 135*  BUN 10 6  CREATININE 0.83 0.62  CALCIUM 8.9 9.4     Studies/Results: No results found.  Anti-infectives: Anti-infectives   Start     Dose/Rate Route Frequency Ordered Stop   04/24/13 1600  fluconazole (DIFLUCAN) tablet 100 mg     100 mg Oral Daily 04/24/13 1411 04/25/13 1112   04/24/13 1600  levofloxacin (LEVAQUIN) tablet 500 mg     500 mg Oral Daily 04/24/13 1411 04/25/13 1111   04/24/13 1007  cefOXitin (MEFOXIN) 2 g in dextrose 5 % 50 mL IVPB     2 g 100 mL/hr over 30 Minutes Intravenous On call to O.R. 04/24/13 1007 04/24/13 1137      Assessment/Plan: s/p Procedure(s): ILEOSTOMY TAKEDOWN Appears to be doing well. Advance to regular diet. Has difficulty with  large pills. Will try liquid oral pain  medication and discontinue PCA if adequate.    LOS: 3 days    Torianne Laflam T 04/27/2013

## 2013-04-28 ENCOUNTER — Ambulatory Visit: Payer: BC Managed Care – PPO | Admitting: Physical Therapy

## 2013-04-28 LAB — CBC
Hemoglobin: 10.8 g/dL — ABNORMAL LOW (ref 12.0–15.0)
RBC: 3.68 MIL/uL — ABNORMAL LOW (ref 3.87–5.11)

## 2013-04-28 LAB — BASIC METABOLIC PANEL
CO2: 22 mEq/L (ref 19–32)
Glucose, Bld: 135 mg/dL — ABNORMAL HIGH (ref 70–99)
Potassium: 3.8 mEq/L (ref 3.5–5.1)
Sodium: 136 mEq/L (ref 135–145)

## 2013-04-28 MED ORDER — ENOXAPARIN SODIUM 30 MG/0.3ML ~~LOC~~ SOLN
30.0000 mg | SUBCUTANEOUS | Status: DC
  Administered 2013-04-28 – 2013-04-30 (×3): 30 mg via SUBCUTANEOUS
  Filled 2013-04-28 (×5): qty 0.3

## 2013-04-28 NOTE — Progress Notes (Signed)
Patient ID: Renee Nicholson, female   DOB: 06-13-1966, 47 y.o.   MRN: [id number] 4 Days Post-Op  Subjective: Still having lots of gas pain and bloating, but is passing some gas and having stools.  Some nausea.  Objective: Vital signs in last 24 hours: Temp:  [98.1 F (36.7 C)-98.8 F (37.1 C)] 98.1 F (36.7 C) (08/04 0510) Pulse Rate:  [63-78] 63 (08/04 0510) Resp:  [16-20] 16 (08/04 0510) BP: (121-155)/(80-91) 121/91 mmHg (08/04 0510) SpO2:  [95 %-100 %] 100 % (08/04 0510) Last BM Date: 04/27/13  Intake/Output from previous day: 08/03 0701 - 08/04 0700 In: 1868.8 [P.O.:80; I.V.:1788.8] Out: 2500 [Urine:2500] Intake/Output this shift:    General appearance: alert, cooperative and no distress GI: + distended.   Incision/Wound: Raw skin nearly completely healed.    Lab Results:   Recent Labs  04/27/13 0350 04/28/13 0315  WBC 7.3 5.9  HGB 11.0* 10.8*  HCT 31.7* 31.5*  PLT 211 221   BMET  Recent Labs  04/27/13 0350 04/28/13 0315  NA 131* 136  K 4.0 3.8  CL 98 104  CO2 22 22  GLUCOSE 135* 135*  BUN 6 4*  CREATININE 0.62 0.50  CALCIUM 9.4 9.4     Studies/Results: No results found.  Anti-infectives: Anti-infectives   Start     Dose/Rate Route Frequency Ordered Stop   04/24/13 1600  fluconazole (DIFLUCAN) tablet 100 mg     100 mg Oral Daily 04/24/13 1411 04/25/13 1112   04/24/13 1600  levofloxacin (LEVAQUIN) tablet 500 mg     500 mg Oral Daily 04/24/13 1411 04/25/13 1111   04/24/13 1007  cefOXitin (MEFOXIN) 2 g in dextrose 5 % 50 mL IVPB     2 g 100 mL/hr over 30 Minutes Intravenous On call to O.R. 04/24/13 1007 04/24/13 1137      Assessment/Plan: s/p Procedure(s): ILEOSTOMY TAKEDOWN Continue lomotil for stool output. If still distended tomorrow, would get xray.   Advised smaller meals. Decrease IVF.     LOS: 4 days    [facility] 04/28/2013

## 2013-04-29 MED ORDER — ENSURE COMPLETE PO LIQD
237.0000 mL | Freq: Three times a day (TID) | ORAL | Status: DC
  Administered 2013-04-30 (×2): 237 mL via ORAL

## 2013-04-29 MED ORDER — SODIUM CHLORIDE 0.9 % IJ SOLN: 3.0000 mL | Freq: Two times a day (BID) | INTRAMUSCULAR | Status: DC

## 2013-04-29 MED ORDER — DIPHENOXYLATE-ATROPINE 2.5-0.025 MG PO TABS
1.0000 | ORAL_TABLET | Freq: Two times a day (BID) | ORAL | Status: DC
  Administered 2013-04-29 – 2013-04-30 (×3): 1 via ORAL
  Filled 2013-04-29 (×3): qty 1

## 2013-04-29 MED ORDER — SODIUM CHLORIDE 0.9 % IV SOLN
250.0000 mL | INTRAVENOUS | Status: DC | PRN
  Administered 2013-04-29: 500 mL via INTRAVENOUS

## 2013-04-29 MED ORDER — SODIUM CHLORIDE 0.9 % IJ SOLN: 3.0000 mL | INTRAMUSCULAR | Status: DC | PRN

## 2013-04-29 NOTE — Progress Notes (Signed)
Patient ID: Renee Nicholson, female   DOB: 1966-06-11, 47 y.o.   MRN: [id number] 5 Days Post-Op  Subjective: Still having some gas pain. Decreased numbers of stools.    Objective: Vital signs in last 24 hours: Temp:  [97.7 F (36.5 C)-98.5 F (36.9 C)] 98.5 F (36.9 C) (08/05 0541) Pulse Rate:  [73-76] 76 (08/05 0541) Resp:  [16-20] 18 (08/05 0800) BP: (144-175)/(77-88) 175/84 mmHg (08/05 0541) SpO2:  [100 %] 100 % (08/05 0800) Last BM Date: 04/27/13  Intake/Output from previous day: 08/04 0701 - 08/05 0700 In: 1503 [P.O.:480; I.V.:1023] Out: 1551 [Urine:1550; Stool:1] Intake/Output this shift:    General appearance: alert, cooperative and no distress GI: + distended, less than yesterday Incision/Wound: Raw skin nearly completely healed.    Lab Results:   Recent Labs  04/27/13 0350 04/28/13 0315  WBC 7.3 5.9  HGB 11.0* 10.8*  HCT 31.7* 31.5*  PLT 211 221   BMET  Recent Labs  04/27/13 0350 04/28/13 0315  NA 131* 136  K 4.0 3.8  CL 98 104  CO2 22 22  GLUCOSE 135* 135*  BUN 6 4*  CREATININE 0.62 0.50  CALCIUM 9.4 9.4     Studies/Results: No results found.  Anti-infectives: Anti-infectives   Start     Dose/Rate Route Frequency Ordered Stop   04/24/13 1600  fluconazole (DIFLUCAN) tablet 100 mg     100 mg Oral Daily 04/24/13 1411 04/25/13 1112   04/24/13 1600  levofloxacin (LEVAQUIN) tablet 500 mg     500 mg Oral Daily 04/24/13 1411 04/25/13 1111   04/24/13 1007  cefOXitin (MEFOXIN) 2 g in dextrose 5 % 50 mL IVPB     2 g 100 mL/hr over 30 Minutes Intravenous On call to O.R. 04/24/13 1007 04/24/13 1137      Assessment/Plan: s/p Procedure(s): ILEOSTOMY TAKEDOWN Continue lomotil for stool output. Saline lock IVF, d/c pca    LOS: 5 days    [facility] 04/29/2013

## 2013-04-30 ENCOUNTER — Encounter: Payer: BC Managed Care – PPO | Admitting: Physical Therapy

## 2013-04-30 LAB — BASIC METABOLIC PANEL
BUN: 14 mg/dL (ref 6–23)
CO2: 25 mEq/L (ref 19–32)
Chloride: 99 mEq/L (ref 96–112)
Glucose, Bld: 165 mg/dL — ABNORMAL HIGH (ref 70–99)
Potassium: 3.8 mEq/L (ref 3.5–5.1)

## 2013-04-30 LAB — CBC
HCT: 31.7 % — ABNORMAL LOW (ref 36.0–46.0)
Hemoglobin: 11.2 g/dL — ABNORMAL LOW (ref 12.0–15.0)
MCH: 30.1 pg (ref 26.0–34.0)
MCHC: 35.3 g/dL (ref 30.0–36.0)

## 2013-04-30 MED ORDER — DIPHENOXYLATE-ATROPINE 2.5-0.025 MG PO TABS
1.0000 | ORAL_TABLET | Freq: Three times a day (TID) | ORAL | Status: DC
  Administered 2013-04-30 – 2013-05-01 (×3): 1 via ORAL
  Filled 2013-04-30 (×3): qty 1

## 2013-04-30 MED ORDER — OXYCODONE-ACETAMINOPHEN 5-325 MG/5ML PO SOLN: 10.0000 mL | ORAL | Status: DC | PRN

## 2013-04-30 MED ORDER — PROMETHAZINE HCL 12.5 MG PO TABS: 12.5000 mg | ORAL_TABLET | Freq: Four times a day (QID) | ORAL | Status: DC | PRN

## 2013-04-30 MED ORDER — DIPHENOXYLATE-ATROPINE 2.5-0.025 MG PO TABS: 1.0000 | ORAL_TABLET | Freq: Three times a day (TID) | ORAL | Status: DC

## 2013-04-30 NOTE — Progress Notes (Signed)
NUTRITION FOLLOW UP  Intervention:   - Pt eating excellent and drinking nutritional supplements, nutrition signing off, please consult if needed  Nutrition Dx:   Inadequate oral intake related to clear liquid diet as evidenced by diet order - resolved    Goal:   Advance diet as tolerated to regular diet - met   Assessment:   Pt reports eating 2 meals/day, drinking 2 Ensure Complete/day, and drinking 1 Resource Breeze. Ate 75% of breakfast this morning. States her nausea is getting better and reports only 2-3 loose stools today.   Height: Ht Readings from Last 1 Encounters:  04/24/13 4\' 9"  (1.448 m)    Weight Status:   Wt Readings from Last 1 Encounters:  04/24/13 70 lb (31.752 kg)    Re-estimated needs:  Kcal: 1150-1300  Protein: 50-60g  Fluid: 1.1-1.3L/day   Skin: Abdominal incision    Diet Order: Vegetarian   Intake/Output Summary (Last 24 hours) at 04/30/13 1242 Last data filed at 04/30/13 1000  Gross per 24 hour  Intake    598 ml  Output    200 ml  Net    398 ml    Last BM: 8/5   Labs:   Recent Labs Lab 04/26/13 0453 04/27/13 0350 04/28/13 0315  NA 132* 131* 136  K 3.8 4.0 3.8  CL 100 98 104  CO2 24 22 22   BUN 10 6 4*  CREATININE 0.83 0.62 0.50  CALCIUM 8.9 9.4 9.4  GLUCOSE 123* 135* 135*    CBG (last 3)  No results found for this basename: GLUCAP,  in the last 72 hours  Scheduled Meds: . diphenoxylate-atropine  1 tablet Oral BID  . enoxaparin (LOVENOX) injection  30 mg Subcutaneous Q24H  . feeding supplement  237 mL Oral TID BM  . feeding supplement  1 Container Oral TID BM  . megestrol  400 mg Oral Daily  . sodium chloride  3 mL Intravenous Q12H    Levon Hedger MS, RD, LDN [phone] Pager [phone] After Hours Pager

## 2013-04-30 NOTE — Progress Notes (Signed)
Patient ID: Renee Nicholson, female   DOB: [birth year], 46 y.o.   MRN: [id number] 6 Days Post-Op  Subjective: Had bad afternoon yesterday.  Felt nausea and did not pass much flatus.    Objective: Vital signs in last 24 hours: Temp:  [97.7 F (36.5 C)-98 F (36.7 C)] 98 F (36.7 C) (08/06 0600) Pulse Rate:  [63-89] 89 (08/06 0600) Resp:  [18] 18 (08/06 0600) BP: (147-173)/(90-92) 147/90 mmHg (08/06 0600) SpO2:  [96 %-99 %] 96 % (08/06 0600) Last BM Date: 04/29/13  Intake/Output from previous day: 08/05 0701 - 08/06 0700 In: 838 [P.O.:600; I.V.:238] Out: 550 [Urine:550] Intake/Output this shift: Total I/O In: 40 [I.V.:40] Out: 0   General appearance: alert, cooperative and no distress GI: + distended, still less than yesterday.   Incision/Wound: Raw skin nearly completely healed.    Lab Results:   Recent Labs  04/28/13 0315  WBC 5.9  HGB 10.8*  HCT 31.5*  PLT 221   BMET  Recent Labs  04/28/13 0315  NA 136  K 3.8  CL 104  CO2 22  GLUCOSE 135*  BUN 4*  CREATININE 0.50  CALCIUM 9.4     Studies/Results: No results found.  Anti-infectives: Anti-infectives   Start     Dose/Rate Route Frequency Ordered Stop   04/24/13 1600  fluconazole (DIFLUCAN) tablet 100 mg     100 mg Oral Daily 04/24/13 1411 04/25/13 1112   04/24/13 1600  levofloxacin (LEVAQUIN) tablet 500 mg     500 mg Oral Daily 04/24/13 1411 04/25/13 1111   04/24/13 1007  cefOXitin (MEFOXIN) 2 g in dextrose 5 % 50 mL IVPB     2 g 100 mL/hr over 30 Minutes Intravenous On call to O.R. 04/24/13 1007 04/24/13 1137      Assessment/Plan: s/p Procedure(s): ILEOSTOMY TAKEDOWN Change lomotil to TID for diarrhea. Check labs to make sure not dehydrated. Hopefully home tomorrow.   LOS: 6 days    [facility] 04/30/2013

## 2013-04-30 NOTE — Progress Notes (Signed)
Spoke to Dr. Donell Beers on floor informed her that patient reports 4 or 5 BM today but none seen by myself. MD states this is expected.

## 2013-05-01 LAB — CREATININE, SERUM
Creatinine, Ser: 0.6 mg/dL (ref 0.50–1.10)
GFR calc non Af Amer: >90 mL/min (ref >90)

## 2013-05-01 MED ORDER — DIPHENOXYLATE-ATROPINE 2.5-0.025 MG PO TABS: 1.0000 | ORAL_TABLET | Freq: Four times a day (QID) | ORAL | Status: DC

## 2013-05-01 MED ORDER — HEPARIN SOD (PORK) LOCK FLUSH 100 UNIT/ML IV SOLN
500.0000 [IU] | INTRAVENOUS | Status: AC | PRN
  Administered 2013-05-01: 500 [IU]

## 2013-05-01 NOTE — Progress Notes (Signed)
Discharge instructions given to patient. Rx for lamotil and oxycodone. Per patient and education assessment, no interpreter needed and patient states her husband is in room as well. Patient speaks english. Patient and husband states understanding of discharge instructrions.

## 2013-05-01 NOTE — Discharge Summary (Signed)
Physician Discharge Summary  Patient ID: Renee Nicholson MRN: [id number] DOB/AGE: Apr 05, 1966 47 y.o.  Admit date: 04/24/2013 Discharge date: 05/01/2013  Admission Diagnoses: Rectal cancer, s/p TAC, IPAA, diverting ileostomy Candidiasis and cellulitis of peristomal skin  Discharge Diagnoses:  Resolved abdominal wall cellulitis Diarrhea S/p ileostomy takedown  Discharged Condition: stable  Hospital Course:  Pt was admitted to the hospital following an ileostomy takedown.  She had some bloating and expected ileus.  Her diet was slowly advanced.  She had frequent stools that were difficult to control, also as expected.  Lomotil was added 4 x/day and she then had difficulty with bowel movements.  We adjusted the lomotil to 2x/day, then 3x/day to try to fine tune the number of bowel movements.  She was doing better and getting around well.  Her appetite started to improve.  Her nausea resolved.  She was discharged to home in stable condition.    Consults: None  Significant Diagnostic Studies: labs: potassium OK, HCT normal.    Treatments: surgery: ileostomy takedown  Discharge Exam: Blood pressure 132/85, pulse 69, temperature 97.5 F (36.4 C), temperature source Oral, resp. rate 18, height 4\' 9"  (1.448 m), weight 70 lb (31.752 kg), last menstrual period 02/20/2012, SpO2 96.00%. General appearance: alert, cooperative and no distress Resp: breathing comfortably GI: soft, minimally distended, skin in good condition.   Extremities: extremities normal, atraumatic, no cyanosis or edema  Disposition: 06-Home-Health Care Svc  Discharge Orders   Future Appointments Provider Department Dept Phone   05/20/2013 10:15 AM [name], MD [facility] - Inpatient Surgery, [place] [phone]   09/15/2013 10:30 AM Windell [facility] MEDICAL ONCOLOGY [phone]   09/15/2013 11:00 AM Victorino December, MD [facility] MEDICAL ONCOLOGY [phone]   Future Orders Complete By  Expires     Ambulatory referral to Home Health  As directed     Comments:      Please evaluate Melody Cirrincione for admission to [facility].  Disciplines requested: Nursing  Services to provide: [facility] Care  Physician to follow patient's care (the person listed here will be responsible for signing ongoing orders): Referring Provider  Requested Start of Care Date: Tomorrow  Special Instructions:  Wet to dry abd dressing daily    Call MD for:  difficulty breathing, headache or visual disturbances  As directed     Call MD for:  hives  As directed     Call MD for:  persistant dizziness or light-headedness  As directed     Call MD for:  persistant nausea and vomiting  As directed     Call MD for:  redness, tenderness, or signs of infection (pain, swelling, redness, odor or green/yellow discharge around incision site)  As directed     Call MD for:  severe uncontrolled pain  As directed     Call MD for:  temperature >100.4  As directed     Change dressing (specify)  As directed     Comments:      Dressing change:1 time per day using wet to dry NS.    Diet - low sodium heart healthy  As directed     Increase activity slowly  As directed         Medication List    STOP taking these medications       fluconazole 100 MG tablet  Commonly known as:  DIFLUCAN     levofloxacin 500 MG tablet  Commonly known as:  LEVAQUIN  TAKE these medications       B-complex with vitamin C tablet  Take 1 tablet by mouth daily.     diphenoxylate-atropine 2.5-0.025 MG per tablet  Commonly known as:  LOMOTIL  Take 1 tablet by mouth 4 (four) times daily.     megestrol 40 MG/ML suspension  Commonly known as:  MEGACE  Take 400 mg by mouth daily.     multivitamin with minerals Tabs tablet  Take 1 tablet by mouth daily.     nystatin 100000 UNIT/GM Powd  Apply topically 3 (three) times daily. Apply with ostomy bag change     oxyCODONE-acetaminophen 5-325 MG/5ML solution  Commonly known  as:  ROXICET  Take 10 mLs by mouth every 4 (four) hours as needed.     promethazine 12.5 MG tablet  Commonly known as:  PHENERGAN  Take 1 tablet (12.5 mg total) by mouth every 6 (six) hours as needed for nausea.           Follow-up Information   Follow up with [facility], [place]. Schedule an appointment as soon as possible for a visit in 2 weeks.   Contact information:   [address] [place] [address] [phone]       Signed: [name] 05/01/2013, 8:18 AM

## 2013-05-08 ENCOUNTER — Telehealth (INDEPENDENT_AMBULATORY_CARE_PROVIDER_SITE_OTHER): Payer: Self-pay

## 2013-05-08 NOTE — Telephone Encounter (Signed)
Pt and her husband came to the office this afternoon.  They were told that the Rx for tincture of opium was called in to [facility].  All instructions would be gone over with them by the pharmacist.  They have plenty of Imodium and the pt has been taking 2 pills 4 x day.  Her appt was moved from 8/26 to 8/22 per her request.

## 2013-05-08 NOTE — Telephone Encounter (Signed)
Pt's husband calling to report she is experiencing chronic diarrhea, with raw bottom and hemorrhoids.  She is taking Imodium, and has started applying Desitin to her anal area. She has no fever, or chills and her appetite is good.  Pt's husband would still like her to be seen.  I told him I would contact Dr. Donell Beers for instructions and call him back.  He agreed.

## 2013-05-08 NOTE — Telephone Encounter (Signed)
Make sure she is taking imodium, 2 pills 4 times per day.  If so, add tincture of opium 4 drops 3 times/day.

## 2013-05-16 ENCOUNTER — Other Ambulatory Visit (INDEPENDENT_AMBULATORY_CARE_PROVIDER_SITE_OTHER): Payer: Self-pay | Admitting: General Surgery

## 2013-05-16 ENCOUNTER — Encounter (INDEPENDENT_AMBULATORY_CARE_PROVIDER_SITE_OTHER): Payer: Self-pay | Admitting: General Surgery

## 2013-05-16 ENCOUNTER — Ambulatory Visit (INDEPENDENT_AMBULATORY_CARE_PROVIDER_SITE_OTHER): Payer: BC Managed Care – PPO | Admitting: General Surgery

## 2013-05-16 VITALS — BP 92/62 | HR 72 | Temp 97.7°F | Resp 14 | Ht <= 58 in | Wt 70.8 lb

## 2013-05-16 DIAGNOSIS — R197 Diarrhea, unspecified: Secondary | ICD-10-CM | POA: Insufficient documentation

## 2013-05-16 DIAGNOSIS — C20 Malignant neoplasm of rectum: Secondary | ICD-10-CM

## 2013-05-16 NOTE — Patient Instructions (Signed)
Follow up in 4 weeks  Go see dietitian at cancer center.    Continue daily dressing change.

## 2013-05-16 NOTE — Assessment & Plan Note (Signed)
Continue wound care to ostomy site.    Healing well.  Follow up in 4 weeks.

## 2013-05-16 NOTE — Assessment & Plan Note (Signed)
Imodium and paregoric have stools down to 8/day.  Continue this regimen.  Continue desitin to perianal skin.  Not having incontinence.

## 2013-05-16 NOTE — Progress Notes (Signed)
HISTORY Pt is now around 3 weeks s/p ileostomy takedown.  She was originally having significant diarrhea, but this is improved with adding paregoric to imodium.  She is using barrier cream on the perianal skin.  She was having around 10-14 BMs per day, but now is down to around 8.  She is having reasonable sensation and control of bowel movements.  EXAM: General:  Alert and oriented. Incision:  Wound granulating nicely.  No cellulitis or candidiasis.  Perianal skin with minimal irritation.   PATHOLOGY: stoma   ASSESSMENT AND PLAN:   Diarrhea Imodium and paregoric have stools down to 8/day.  Continue this regimen.  Continue desitin to perianal skin.  Not having incontinence.  Rectal cancer s/p TAC, IPAA, loop ileostomy on 12/14/11, ostomy revision 12/2012, ostomy takedown 04/24/2013 Continue wound care to ostomy site.    Healing well.  Follow up in 4 weeks.        Maudry Diego, MD Surgical Oncology, General & Endocrine Surgery [facility]  [name], MD [name], MD

## 2013-05-20 ENCOUNTER — Encounter (INDEPENDENT_AMBULATORY_CARE_PROVIDER_SITE_OTHER): Payer: BC Managed Care – PPO | Admitting: General Surgery

## 2013-05-21 ENCOUNTER — Other Ambulatory Visit (INDEPENDENT_AMBULATORY_CARE_PROVIDER_SITE_OTHER): Payer: Self-pay | Admitting: General Surgery

## 2013-05-23 ENCOUNTER — Other Ambulatory Visit (INDEPENDENT_AMBULATORY_CARE_PROVIDER_SITE_OTHER): Payer: Self-pay | Admitting: General Surgery

## 2013-05-23 DIAGNOSIS — C20 Malignant neoplasm of rectum: Secondary | ICD-10-CM

## 2013-05-28 ENCOUNTER — Telehealth: Payer: Self-pay | Admitting: Oncology

## 2013-05-28 NOTE — Telephone Encounter (Signed)
S/w pt's husband re nutrition appt 9/5 @ 10:30am. Appt scheduled per referral from Dr. Donell Beers @ [facility]. Message to [facility] @ [facility] letting her know appt scheduled for 9/5. Kendal emailed me (EPIC) re referral.

## 2013-05-30 ENCOUNTER — Emergency Department (HOSPITAL_COMMUNITY): Payer: BC Managed Care – PPO

## 2013-05-30 ENCOUNTER — Ambulatory Visit: Payer: BC Managed Care – PPO | Admitting: Nutrition

## 2013-05-30 ENCOUNTER — Encounter: Payer: Self-pay | Admitting: Nutrition

## 2013-05-30 ENCOUNTER — Inpatient Hospital Stay (HOSPITAL_COMMUNITY)
Admission: EM | Admit: 2013-05-30 | Discharge: 2013-06-01 | DRG: 229 | Disposition: A | Discharge status: Home / Self Care | Payer: BC Managed Care – PPO | Attending: Orthopedic Surgery | Admitting: Orthopedic Surgery

## 2013-05-30 DIAGNOSIS — W3301XA Accidental discharge of shotgun, initial encounter: Secondary | ICD-10-CM

## 2013-05-30 DIAGNOSIS — IMO0002 Reserved for concepts with insufficient information to code with codable children: Principal | ICD-10-CM | POA: Diagnosis present

## 2013-05-30 DIAGNOSIS — S62619B Displaced fracture of proximal phalanx of unspecified finger, initial encounter for open fracture: Secondary | ICD-10-CM

## 2013-05-30 DIAGNOSIS — Z9221 Personal history of antineoplastic chemotherapy: Secondary | ICD-10-CM

## 2013-05-30 DIAGNOSIS — Z923 Personal history of irradiation: Secondary | ICD-10-CM

## 2013-05-30 DIAGNOSIS — Z800 Family history of malignant neoplasm of digestive organs: Secondary | ICD-10-CM

## 2013-05-30 DIAGNOSIS — Z85048 Personal history of other malignant neoplasm of rectum, rectosigmoid junction, and anus: Secondary | ICD-10-CM

## 2013-05-30 DIAGNOSIS — Z932 Ileostomy status: Secondary | ICD-10-CM

## 2013-05-30 LAB — BASIC METABOLIC PANEL
Calcium: 8.6 mg/dL (ref 8.4–10.5)
Creatinine, Ser: 0.63 mg/dL (ref 0.50–1.10)
GFR calc non Af Amer: >90 mL/min (ref >90)
Glucose, Bld: 104 mg/dL — ABNORMAL HIGH (ref 70–99)
Sodium: 140 mEq/L (ref 135–145)

## 2013-05-30 LAB — CBC
Hemoglobin: 10.7 g/dL — ABNORMAL LOW (ref 12.0–15.0)
MCH: 30.5 pg (ref 26.0–34.0)
MCHC: 34.4 g/dL (ref 30.0–36.0)
MCV: 88.6 fL (ref 78.0–100.0)

## 2013-05-30 MED ORDER — POLYETHYLENE GLYCOL 3350 17 G PO PACK: 17.0000 g | PACK | Freq: Every day | ORAL | Status: DC | PRN

## 2013-05-30 MED ORDER — METHOCARBAMOL 100 MG/ML IJ SOLN
500.0000 mg | Freq: Four times a day (QID) | INTRAVENOUS | Status: DC | PRN
  Filled 2013-05-30: qty 5

## 2013-05-30 MED ORDER — VITAMIN C 500 MG PO TABS
1000.0000 mg | ORAL_TABLET | Freq: Every day | ORAL | Status: DC
  Filled 2013-05-30 (×2): qty 2

## 2013-05-30 MED ORDER — HYDROMORPHONE HCL PF 1 MG/ML IJ SOLN
0.5000 mg | Freq: Once | INTRAMUSCULAR | Status: AC
  Administered 2013-05-30: 0.5 mg via INTRAVENOUS
  Filled 2013-05-30: qty 1

## 2013-05-30 MED ORDER — DIPHENOXYLATE-ATROPINE 2.5-0.025 MG PO TABS
1.0000 | ORAL_TABLET | Freq: Four times a day (QID) | ORAL | Status: DC
  Filled 2013-05-30: qty 3

## 2013-05-30 MED ORDER — ONDANSETRON HCL 4 MG/2ML IJ SOLN
4.0000 mg | INTRAMUSCULAR | Status: AC
  Administered 2013-05-30: 4 mg via INTRAVENOUS
  Filled 2013-05-30: qty 2

## 2013-05-30 MED ORDER — CEFAZOLIN SODIUM 1-5 GM-% IV SOLN
1.0000 g | Freq: Three times a day (TID) | INTRAVENOUS | Status: DC
  Administered 2013-05-31 – 2013-06-01 (×4): 1 g via INTRAVENOUS
  Filled 2013-05-30 (×6): qty 50

## 2013-05-30 MED ORDER — CEFAZOLIN SODIUM-DEXTROSE 2-3 GM-% IV SOLR
2.0000 g | Freq: Three times a day (TID) | INTRAVENOUS | Status: DC
  Administered 2013-05-30: 2 g via INTRAVENOUS
  Filled 2013-05-30 (×2): qty 50

## 2013-05-30 MED ORDER — ONDANSETRON HCL 4 MG/2ML IJ SOLN
4.0000 mg | Freq: Four times a day (QID) | INTRAMUSCULAR | Status: DC | PRN
  Administered 2013-05-31 (×2): 4 mg via INTRAVENOUS
  Filled 2013-05-30 (×2): qty 2

## 2013-05-30 MED ORDER — KCL IN DEXTROSE-NACL 20-5-0.9 MEQ/L-%-% IV SOLN
INTRAVENOUS | Status: DC
  Administered 2013-05-31: 15:00:00 via INTRAVENOUS
  Filled 2013-05-30 (×4): qty 1000

## 2013-05-30 MED ORDER — OXYCODONE-ACETAMINOPHEN 5-325 MG PO TABS
1.0000 | ORAL_TABLET | ORAL | Status: DC | PRN
  Administered 2013-05-31: 2 via ORAL

## 2013-05-30 MED ORDER — B COMPLEX-C PO TABS
1.0000 | ORAL_TABLET | Freq: Every day | ORAL | Status: DC
  Filled 2013-05-30 (×2): qty 1

## 2013-05-30 MED ORDER — ADULT MULTIVITAMIN W/MINERALS CH
1.0000 | ORAL_TABLET | Freq: Every day | ORAL | Status: DC
  Filled 2013-05-30 (×2): qty 1

## 2013-05-30 MED ORDER — DOCUSATE SODIUM 100 MG PO CAPS
100.0000 mg | ORAL_CAPSULE | Freq: Two times a day (BID) | ORAL | Status: DC
  Administered 2013-05-31: 100 mg via ORAL
  Filled 2013-05-30 (×4): qty 1

## 2013-05-30 MED ORDER — ADULT MULTIVITAMIN W/MINERALS CH: 1.0000 | ORAL_TABLET | Freq: Every day | ORAL | Status: DC

## 2013-05-30 MED ORDER — METHOCARBAMOL 500 MG PO TABS
500.0000 mg | ORAL_TABLET | Freq: Four times a day (QID) | ORAL | Status: DC | PRN
  Administered 2013-05-31: 500 mg via ORAL

## 2013-05-30 MED ORDER — MORPHINE SULFATE 2 MG/ML IJ SOLN
1.0000 mg | INTRAMUSCULAR | Status: DC | PRN
  Administered 2013-05-31 (×2): 1 mg via INTRAVENOUS
  Filled 2013-05-30 (×2): qty 1

## 2013-05-30 MED ORDER — HYDROCODONE-ACETAMINOPHEN 5-325 MG PO TABS
1.0000 | ORAL_TABLET | ORAL | Status: DC | PRN
  Administered 2013-05-31 – 2013-06-01 (×4): 1 via ORAL
  Filled 2013-05-30 (×4): qty 1

## 2013-05-30 MED ORDER — BISACODYL 10 MG RE SUPP: 10.0000 mg | Freq: Every day | RECTAL | Status: DC | PRN

## 2013-05-30 MED ORDER — ONDANSETRON HCL 4 MG PO TABS: 4.0000 mg | ORAL_TABLET | Freq: Four times a day (QID) | ORAL | Status: DC | PRN
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
## 2013-05-30 NOTE — Progress Notes (Signed)
Orthopedic Tech Progress Note Patient Details:  Renee Nicholson 1966/08/05 [id number]  Ortho Devices Type of Ortho Device: Ace wrap;Volar splint Ortho Device/Splint Location: lue Ortho Device/Splint Interventions: Application As ordered by Dr. Olevia Bowens, Renee Nicholson 05/30/2013, 10:16 PM

## 2013-05-30 NOTE — Progress Notes (Signed)
Patient did not show up for nutrition appointment today. 
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
## 2013-05-30 NOTE — Progress Notes (Signed)
Patient presented to nutrition appointment 40 minutes late.  She did have an interpretor with her.  She is healing well from her ileostomy takedown.  She reports 6-7 stools during the day and 3-4 stools overnight.  She reports compliance with medication.  Weight is up slightly to 73.4 pounds from 70.8 pounds August 22.  Patient reports consuming Ensure Plus twice daily, magic cup once daily, and meals and snacks throughout the day.  She is a vegetarian and continues to consume vegetarian proteins.  Nutrition Diagnosis:  Underweight related to increased energy needs as evidenced by BMI of 15.88.  Intervention:   Patient was educated on low fiber diet with increased calories and protein. Increase Ensure Plus TID. Continue magic cup as tolerated once daily. 5 small meals/snacks daily.  Monitoring, Evaluation, Goals: Patient will tolerate oral diet with oral supplements to promote healing and weight gain.  Next Visit:  Patient will contact me with questions or concerns.
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
## 2013-05-31 ENCOUNTER — Observation Stay (HOSPITAL_COMMUNITY): Payer: BC Managed Care – PPO | Admitting: Anesthesiology

## 2013-05-31 ENCOUNTER — Encounter (HOSPITAL_COMMUNITY)
Admission: EM | Disposition: A | Discharge status: Home / Self Care | Payer: Self-pay | Source: Home / Self Care | Attending: Orthopedic Surgery

## 2013-05-31 ENCOUNTER — Inpatient Hospital Stay: Admit: 2013-05-31 | Payer: Self-pay | Admitting: Orthopedic Surgery

## 2013-05-31 ENCOUNTER — Encounter (HOSPITAL_COMMUNITY): Payer: Self-pay | Admitting: Anesthesiology

## 2013-05-31 HISTORY — PX: OPEN REDUCTION INTERNAL FIXATION (ORIF) METACARPAL: SHX6234

## 2013-05-31 HISTORY — PX: I&D EXTREMITY: SHX5045

## 2013-05-31 SURGERY — IRRIGATION AND DEBRIDEMENT EXTREMITY
Anesthesia: General | Laterality: Left | Wound class: Contaminated

## 2013-05-31 MED ORDER — FENTANYL CITRATE 0.05 MG/ML IJ SOLN
INTRAMUSCULAR | Status: DC | PRN
  Administered 2013-05-31 (×3): 50 ug via INTRAVENOUS

## 2013-05-31 MED ORDER — BUPIVACAINE HCL (PF) 0.25 % IJ SOLN
INTRAMUSCULAR | Status: DC | PRN
  Administered 2013-05-31: 7 mL

## 2013-05-31 MED ORDER — MIDAZOLAM HCL 5 MG/5ML IJ SOLN
INTRAMUSCULAR | Status: DC | PRN
  Administered 2013-05-31: 1 mg via INTRAVENOUS

## 2013-05-31 MED ORDER — HYDROMORPHONE HCL PF 1 MG/ML IJ SOLN
0.2500 mg | INTRAMUSCULAR | Status: DC | PRN
  Administered 2013-05-31: 0.25 mg via INTRAVENOUS

## 2013-05-31 MED ORDER — OXYCODONE-ACETAMINOPHEN 5-325 MG PO TABS
ORAL_TABLET | ORAL | Status: AC
  Filled 2013-05-31: qty 2

## 2013-05-31 MED ORDER — CEFAZOLIN SODIUM-DEXTROSE 2-3 GM-% IV SOLR
2.0000 g | INTRAVENOUS | Status: AC
  Administered 2013-05-31: 2 g via INTRAVENOUS
  Filled 2013-05-31: qty 50

## 2013-05-31 MED ORDER — DEXAMETHASONE SODIUM PHOSPHATE 10 MG/ML IJ SOLN
INTRAMUSCULAR | Status: DC | PRN
  Administered 2013-05-31: 4 mg via INTRAVENOUS

## 2013-05-31 MED ORDER — CEFAZOLIN SODIUM-DEXTROSE 2-3 GM-% IV SOLR
INTRAVENOUS | Status: DC | PRN
  Administered 2013-05-31: 2 g via INTRAVENOUS

## 2013-05-31 MED ORDER — ONDANSETRON HCL 4 MG/2ML IJ SOLN
INTRAMUSCULAR | Status: DC | PRN
  Administered 2013-05-31: 4 mg via INTRAVENOUS

## 2013-05-31 MED ORDER — METHOCARBAMOL 500 MG PO TABS
ORAL_TABLET | ORAL | Status: AC
  Filled 2013-05-31: qty 1

## 2013-05-31 MED ORDER — ONDANSETRON HCL 4 MG/2ML IJ SOLN: 4.0000 mg | Freq: Once | INTRAMUSCULAR | Status: DC | PRN

## 2013-05-31 MED ORDER — BOOST PLUS PO LIQD
237.0000 mL | Freq: Three times a day (TID) | ORAL | Status: DC
  Administered 2013-05-31: 237 mL via ORAL
  Filled 2013-05-31 (×8): qty 237

## 2013-05-31 MED ORDER — SODIUM CHLORIDE 0.9 % IR SOLN
Status: DC | PRN
  Administered 2013-05-31: 1

## 2013-05-31 MED ORDER — HYDROMORPHONE HCL PF 1 MG/ML IJ SOLN
INTRAMUSCULAR | Status: AC
  Filled 2013-05-31: qty 1

## 2013-05-31 MED ORDER — LACTATED RINGERS IV SOLN
INTRAVENOUS | Status: DC | PRN
  Administered 2013-05-31 (×2): via INTRAVENOUS

## 2013-05-31 MED ORDER — PROPOFOL 10 MG/ML IV BOLUS
INTRAVENOUS | Status: DC | PRN
  Administered 2013-05-31: 100 mg via INTRAVENOUS

## 2013-05-31 MED ORDER — LIDOCAINE HCL (CARDIAC) 20 MG/ML IV SOLN
INTRAVENOUS | Status: DC | PRN
  Administered 2013-05-31: 40 mg via INTRAVENOUS

## 2013-05-31 MED ORDER — CHLORHEXIDINE GLUCONATE 4 % EX LIQD
60.0000 mL | Freq: Once | CUTANEOUS | Status: DC
  Filled 2013-05-31: qty 60

## 2013-05-31 SURGICAL SUPPLY — 69 items
BANDAGE CONFORM 2  STR LF (GAUZE/BANDAGES/DRESSINGS) IMPLANT
BANDAGE ELASTIC 3 VELCRO ST LF (GAUZE/BANDAGES/DRESSINGS) ×2 IMPLANT
BANDAGE ELASTIC 4 VELCRO ST LF (GAUZE/BANDAGES/DRESSINGS) ×2 IMPLANT
BANDAGE GAUZE 4  KLING STR (GAUZE/BANDAGES/DRESSINGS) ×2 IMPLANT
BANDAGE GAUZE ELAST BULKY 4 IN (GAUZE/BANDAGES/DRESSINGS) ×2 IMPLANT
BLADE SURG ROTATE 9660 (MISCELLANEOUS) IMPLANT
BNDG COHESIVE 1X5 TAN STRL LF (GAUZE/BANDAGES/DRESSINGS) IMPLANT
BNDG ELASTIC 2 VLCR STRL LF (GAUZE/BANDAGES/DRESSINGS) ×2 IMPLANT
BNDG ESMARK 4X9 LF (GAUZE/BANDAGES/DRESSINGS) ×2 IMPLANT
CLOTH BEACON ORANGE TIMEOUT ST (SAFETY) ×2 IMPLANT
CORDS BIPOLAR (ELECTRODE) ×2 IMPLANT
COVER SURGICAL LIGHT HANDLE (MISCELLANEOUS) ×2 IMPLANT
CUFF TOURN SGL LL 12 NO SLV (MISCELLANEOUS) ×2 IMPLANT
CUFF TOURNIQUET SINGLE 18IN (TOURNIQUET CUFF) IMPLANT
CUFF TOURNIQUET SINGLE 24IN (TOURNIQUET CUFF) IMPLANT
DRAIN PENROSE 1/4X12 LTX STRL (WOUND CARE) IMPLANT
DRAIN TLS ROUND 10FR (DRAIN) IMPLANT
DRAPE OEC MINIVIEW 54X84 (DRAPES) ×2 IMPLANT
DRAPE SURG 17X11 SM STRL (DRAPES) ×2 IMPLANT
DRAPE SURG 17X23 STRL (DRAPES) ×2 IMPLANT
DRSG ADAPTIC 3X8 NADH LF (GAUZE/BANDAGES/DRESSINGS) ×2 IMPLANT
DRSG EMULSION OIL 3X3 NADH (GAUZE/BANDAGES/DRESSINGS) ×2 IMPLANT
DRSG TEGADERM 2-3/8X2-3/4 SM (GAUZE/BANDAGES/DRESSINGS) ×2 IMPLANT
ELECT REM PT RETURN 9FT ADLT (ELECTROSURGICAL)
ELECTRODE REM PT RTRN 9FT ADLT (ELECTROSURGICAL) IMPLANT
GAUZE SPONGE 4X4 16PLY XRAY LF (GAUZE/BANDAGES/DRESSINGS) ×2 IMPLANT
GAUZE XEROFORM 1X8 LF (GAUZE/BANDAGES/DRESSINGS) ×2 IMPLANT
GAUZE XEROFORM 5X9 LF (GAUZE/BANDAGES/DRESSINGS) IMPLANT
GLOVE BIOGEL PI IND STRL 8.5 (GLOVE) ×1 IMPLANT
GLOVE BIOGEL PI INDICATOR 8.5 (GLOVE) ×1
GLOVE SURG ORTHO 8.0 STRL STRW (GLOVE) ×2 IMPLANT
GOWN PREVENTION PLUS XLARGE (GOWN DISPOSABLE) ×2 IMPLANT
GOWN STRL NON-REIN LRG LVL3 (GOWN DISPOSABLE) ×2 IMPLANT
HANDPIECE INTERPULSE COAX TIP (DISPOSABLE)
K-WIRE 1.6X70X15 THRDED (Screw) ×2 IMPLANT
KIT BASIN OR (CUSTOM PROCEDURE TRAY) ×2 IMPLANT
KIT FIXATOR LONG MINI (Nail) ×2 IMPLANT
KIT ROOM TURNOVER OR (KITS) ×2 IMPLANT
MANIFOLD NEPTUNE II (INSTRUMENTS) ×2 IMPLANT
NEEDLE HYPO 25GX1X1/2 BEV (NEEDLE) IMPLANT
NEEDLE HYPO 25X1 1.5 SAFETY (NEEDLE) ×2 IMPLANT
NS IRRIG 1000ML POUR BTL (IV SOLUTION) ×2 IMPLANT
PACK ORTHO EXTREMITY (CUSTOM PROCEDURE TRAY) ×2 IMPLANT
PAD ARMBOARD 7.5X6 YLW CONV (MISCELLANEOUS) ×4 IMPLANT
PAD CAST 4YDX4 CTTN HI CHSV (CAST SUPPLIES) ×1 IMPLANT
PADDING CAST COTTON 4X4 STRL (CAST SUPPLIES) ×1
SET HNDPC FAN SPRY TIP SCT (DISPOSABLE) IMPLANT
SOAP 2 % CHG 4 OZ (WOUND CARE) ×2 IMPLANT
SPONGE GAUZE 4X4 12PLY (GAUZE/BANDAGES/DRESSINGS) ×2 IMPLANT
SPONGE LAP 18X18 X RAY DECT (DISPOSABLE) IMPLANT
SPONGE LAP 4X18 X RAY DECT (DISPOSABLE) IMPLANT
STRIP CLOSURE SKIN 1/2X4 (GAUZE/BANDAGES/DRESSINGS) IMPLANT
SUCTION FRAZIER TIP 10 FR DISP (SUCTIONS) ×2 IMPLANT
SUT ETHILON 4 0 PS 2 18 (SUTURE) ×2 IMPLANT
SUT ETHILON 5 0 P 3 18 (SUTURE) ×1
SUT MNCRL AB 4-0 PS2 18 (SUTURE) IMPLANT
SUT NYLON ETHILON 5-0 P-3 1X18 (SUTURE) ×1 IMPLANT
SUT PROLENE 4 0 PS 2 18 (SUTURE) ×2 IMPLANT
SUT VIC AB 2-0 FS1 27 (SUTURE) IMPLANT
SUT VICRYL 4-0 PS2 18IN ABS (SUTURE) IMPLANT
SYR CONTROL 10ML LL (SYRINGE) IMPLANT
SYSTEM CHEST DRAIN TLS 7FR (DRAIN) IMPLANT
TOWEL OR 17X24 6PK STRL BLUE (TOWEL DISPOSABLE) ×2 IMPLANT
TOWEL OR 17X26 10 PK STRL BLUE (TOWEL DISPOSABLE) ×2 IMPLANT
TUBE ANAEROBIC SPECIMEN COL (MISCELLANEOUS) IMPLANT
TUBE CONNECTING 12X1/4 (SUCTIONS) ×2 IMPLANT
UNDERPAD 30X30 INCONTINENT (UNDERPADS AND DIAPERS) ×2 IMPLANT
WATER STERILE IRR 1000ML POUR (IV SOLUTION) ×2 IMPLANT
YANKAUER SUCT BULB TIP NO VENT (SUCTIONS) IMPLANT

## 2013-05-31 NOTE — Progress Notes (Signed)
Orthopedic Tech Progress Note Patient Details:  Renee Nicholson 15-Nov-1965 [id number]  Ortho Devices Type of Ortho Device: Arm sling Ortho Device/Splint Location: lue Ortho Device/Splint Interventions: Application   Cammer, Mickie Bail 05/31/2013, 3:36 PM

## 2013-05-31 NOTE — Progress Notes (Signed)
Utilization Review completed.  
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
## 2013-05-31 NOTE — Anesthesia Postprocedure Evaluation (Signed)
  Anesthesia Post-op Note  Patient: Renee Nicholson  Procedure(s) Performed: Procedure(s): IRRIGATION AND DEBRIDEMENT EXTREMITY (Left) OPEN REDUCTION INTERNAL FIXATION (ORIF) METACARPAL (Left)  Patient Location: PACU  Anesthesia Type:General  Level of Consciousness: awake, alert  and oriented  Airway and Oxygen Therapy: Patient Spontanous Breathing  Post-op Pain: mild  Post-op Assessment: Post-op Vital signs reviewed, Patient's Cardiovascular Status Stable, Respiratory Function Stable, Patent Airway and Pain level controlled  Post-op Vital Signs: stable  Complications: No apparent anesthesia complications

## 2013-05-31 NOTE — Transfer of Care (Signed)
Immediate Anesthesia Transfer of Care Note  Patient: Renee Nicholson  Procedure(s) Performed: Procedure(s): IRRIGATION AND DEBRIDEMENT EXTREMITY (Left) OPEN REDUCTION INTERNAL FIXATION (ORIF) METACARPAL (Left)  Patient Location: PACU  Anesthesia Type:General  Level of Consciousness: sedated  Airway & Oxygen Therapy: Patient Spontanous Breathing and Patient connected to nasal cannula oxygen  Post-op Assessment: Report given to PACU RN and Post -op Vital signs reviewed and stable  Post vital signs: stable  Complications: No apparent anesthesia complications
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
## 2013-05-31 NOTE — Progress Notes (Signed)
Physical Therapy Discharge Patient Details Name: Renee Nicholson MRN: [id number] DOB: 11-10-1965 Today's Date: 05/31/2013 Time:  -     Patient discharged from PT services secondary to no PT needs per OT.  OT to address all therapy needs.  Aida Raider, PT  Office # [phone] Pager [phone]          Ilda Foil 05/31/2013, 1:52 PM
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
## 2013-05-31 NOTE — ED Notes (Signed)
Spoke with bed placement. This RN was told that the bed that the pt will be going to is currently being cleaned, and will become available in the next 45 minutes to an hour.

## 2013-06-01 MED ORDER — CEPHALEXIN 500 MG PO CAPS
500.0000 mg | ORAL_CAPSULE | Freq: Four times a day (QID) | ORAL | Status: DC
  Filled 2013-06-01 (×4): qty 1

## 2013-06-01 MED ORDER — VITAMIN C 500 MG PO TABS: 500.0000 mg | ORAL_TABLET | Freq: Two times a day (BID) | ORAL | Status: DC

## 2013-06-01 MED ORDER — HYDROCODONE-ACETAMINOPHEN 5-300 MG PO TABS: 1.0000 | ORAL_TABLET | Freq: Four times a day (QID) | ORAL | Status: DC | PRN

## 2013-06-01 MED ORDER — DOCUSATE SODIUM 100 MG PO CAPS: 100.0000 mg | ORAL_CAPSULE | Freq: Two times a day (BID) | ORAL | Status: DC
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
## 2013-06-10 ENCOUNTER — Encounter (HOSPITAL_COMMUNITY): Payer: Self-pay | Admitting: Orthopedic Surgery

## 2013-06-20 ENCOUNTER — Ambulatory Visit (INDEPENDENT_AMBULATORY_CARE_PROVIDER_SITE_OTHER): Payer: BC Managed Care – PPO | Admitting: General Surgery

## 2013-06-20 ENCOUNTER — Encounter (INDEPENDENT_AMBULATORY_CARE_PROVIDER_SITE_OTHER): Payer: Self-pay | Admitting: General Surgery

## 2013-06-20 VITALS — BP 110/60 | HR 70 | Temp 98.6°F | Resp 16 | Ht <= 58 in | Wt 73.8 lb

## 2013-06-20 DIAGNOSIS — C20 Malignant neoplasm of rectum: Secondary | ICD-10-CM

## 2013-06-20 DIAGNOSIS — K644 Residual hemorrhoidal skin tags: Secondary | ICD-10-CM

## 2013-06-20 MED ORDER — HYDROCORTISONE 2.5 % RE CREA: TOPICAL_CREAM | Freq: Two times a day (BID) | RECTAL | Status: DC

## 2013-06-20 NOTE — Patient Instructions (Signed)
Anusol HC 3 times per day.  Continue warm water baths.  Use baby wipes for sensitive skin without fragrance.

## 2013-06-20 NOTE — Assessment & Plan Note (Signed)
Anusol TID Sitz baths  Follow up in 2 months.
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
## 2013-06-22 NOTE — Assessment & Plan Note (Signed)
Continue to aggressively try to keep numbers of stools down.  Apply hemorrhoid cream to anus for small anteriorly located hemorrhoid.    Follow up in 8 weeks.

## 2013-06-23 ENCOUNTER — Other Ambulatory Visit (INDEPENDENT_AMBULATORY_CARE_PROVIDER_SITE_OTHER): Payer: Self-pay | Admitting: General Surgery

## 2013-07-08 ENCOUNTER — Other Ambulatory Visit (INDEPENDENT_AMBULATORY_CARE_PROVIDER_SITE_OTHER): Payer: Self-pay | Admitting: General Surgery

## 2013-07-10 ENCOUNTER — Telehealth: Payer: Self-pay | Admitting: *Deleted

## 2013-07-10 NOTE — Telephone Encounter (Signed)
SF spoke with patient and is aware to be here on Wednesday at 1115 for a 1130 URG OV.

## 2013-07-10 NOTE — Telephone Encounter (Signed)
Pt informed and Darl Pikes to schedule.

## 2013-07-10 NOTE — Telephone Encounter (Signed)
I called and spoke to pt. She said she is not having any bleeding. Sometimes just a little blood but she is aware that she has hemorrhoids. Pt not seen in about 2 years. Please advise, just OV?

## 2013-07-10 NOTE — Telephone Encounter (Signed)
PLEASE CALL PT. She should not have bleeding from hemorrhoids due to her surgery in 2013. If she is having rectal bleeding she will need a an OPV to consider endoscopy.

## 2013-07-10 NOTE — Telephone Encounter (Signed)
Pt's husband called stating pt seen Dr. Darrick Penna 3 years ago and Dr. Darrick Penna done a colonoscopy on her. Husband states pt is having rectal bleeding, pt's husband stated that it is a hemorrhage. Please advise [phone] or call cell phone [phone]

## 2013-07-16 ENCOUNTER — Other Ambulatory Visit: Payer: Self-pay

## 2013-07-16 ENCOUNTER — Encounter: Payer: Self-pay | Admitting: Gastroenterology

## 2013-07-16 ENCOUNTER — Ambulatory Visit (INDEPENDENT_AMBULATORY_CARE_PROVIDER_SITE_OTHER): Payer: BC Managed Care – PPO | Admitting: Gastroenterology

## 2013-07-16 ENCOUNTER — Encounter (INDEPENDENT_AMBULATORY_CARE_PROVIDER_SITE_OTHER): Payer: Self-pay

## 2013-07-16 VITALS — BP 110/63 | HR 84 | Temp 97.4°F | Ht <= 58 in | Wt 77.8 lb

## 2013-07-16 DIAGNOSIS — R197 Diarrhea, unspecified: Secondary | ICD-10-CM

## 2013-07-16 MED ORDER — OMEPRAZOLE 20 MG PO CPDR: DELAYED_RELEASE_CAPSULE | ORAL | Status: DC

## 2013-07-16 MED ORDER — OPIUM TINCTURE (PAREGORIC) 2 MG/5ML PO TINC: 2.5000 mL | Freq: Three times a day (TID) | ORAL | Status: DC

## 2013-07-16 NOTE — Progress Notes (Signed)
Subjective:    Patient ID: Renee Nicholson, female    DOB: 1965-12-14, 47 y.o.   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 NOW HAS TROUBLE WITH 6-10 STOOLS DAILY. RX: PAREGORIC/IMODIUM/LOMOTIL.  WEIGHT DOWN 20 LBS IN PAST 3 MOS. BURNING IN RECTUM MAY BE AFTER BMS. SEEING BLOOD VERY RARELY. NO BELLY PAIN. SOMETIMES FEELS TIGHT AND GASSY. APPETITE: FEELS HUNGRY. USING MEGACE. MAY EAT AND HAVE TO GO TO BR. BOOST ONE IN AM AND ONE IN PM. USING PROTEIN POWDER & EGG. SAW A NUTRITIONIST. NO STOOL STUDIES. NO TEST FOR SIBO. MAY HAVE FECAL ACCIDENTS 1-2X/WEEK. INITIALLY FREQUENT ACCIDENTS.  Past Medical History  Diagnosis Date  . Anemia   . Diarrhea   . Bright red rectal bleeding 09/13/2011  . History of chemotherapy     completed 09/2011   . Hx of radiation therapy 08/29/11 to 10/09/11    rectum  . Ileostomy in place   . Cancer   . Rectal cancer   . Hx of tracheostomy     12/2012   Past Surgical History  Procedure Laterality Date  . Tubal ligation    . Carpal tunnel release    . Colonoscopy  07/26/2011    Procedure: COLONOSCOPY;  Surgeon: Arlyce Harman, MD;  [facility];  Service: Endoscopy;  Laterality: N/A;  10:40  . Esophagogastroduodenoscopy  07/26/2011    Procedure: ESOPHAGOGASTRODUODENOSCOPY (EGD);  Surgeon: Arlyce Harman, MD;  [facility];  Service: Endoscopy;  Laterality: N/A;  . Esophagogastroduodenoscopy  12/27/2011    Procedure: ESOPHAGOGASTRODUODENOSCOPY (EGD);  Surgeon: Shirley Friar, MD;  Location: [facility];  Service: Endoscopy;  Laterality: N/A;  . Laparoscopic colon resection  12/14/11    diverting ileostomy  . Portacath placement  02/21/2012    Procedure: INSERTION PORT-A-CATH;  Surgeon: [name], MD;  [facility];  Service: General;  Laterality: N/A;  . Evaluation under anesthesia with anal fissurotomy N/A 11/06/2012    Procedure: Exam Under Anesthesia , Anal  Dilation;  Surgeon: [name], MD;  Location: [facility];  Service: General;  Laterality: N/A;  Exam Under Anesthesia , Anal Dilation  . Laparotomy N/A 01/03/2013    Procedure: EXPLORATORY LAPAROTOMY WITH LYSIS OF ADHESIONS, revision of ileostomy;  Surgeon: [name], MD;  [facility];  Service: General;  Laterality: N/A;  . Ileostomy closure N/A 04/24/2013    Procedure: ILEOSTOMY TAKEDOWN;  Surgeon: [name], MD;  Location: [facility];  Service: General;  Laterality: N/A;  . I&d extremity Left 05/31/2013    Procedure: IRRIGATION AND DEBRIDEMENT EXTREMITY;  Surgeon: Sharma Covert, MD;  [facility];  Service: Orthopedics;  Laterality: Left;  . Open reduction internal fixation (orif) metacarpal Left 05/31/2013    Procedure: OPEN REDUCTION INTERNAL FIXATION (ORIF) METACARPAL;  Surgeon: Sharma Covert, MD;  [facility];  Service: Orthopedics;  Laterality: Left;   No Known Allergies  Current Outpatient Prescriptions  Medication Sig Dispense Refill  . B Complex-C (B-COMPLEX WITH VITAMIN C) tablet Take 1 tablet by mouth daily.  30 tablet  1  . diphenoxylate-atropine (LOMOTIL) 2.5-0.025 MG per tablet Take 1 tablet by mouth 4 (four) times daily.  30 tablet  0  . megestrol (MEGACE) 40 MG/ML suspension TAKE TWO TEASPOONSFUL DAILY  240 mL  1  . Multiple Vitamin (MULTIVITAMIN WITH MINERALS) TABS Take 1 tablet by mouth daily.      Marland Kitchen Opium Tincture, Paregoric, 2 MG/5ML TINC Take  5 mLs by mouth daily as needed (diarrhea).       Marland Kitchen OVER THE COUNTER MEDICATION Take 1 drop by mouth daily as needed (gas).      Marland Kitchen PROCTOSOL HC 2.5 % rectal cream APPLY RECTALLY TWO TIMES DAILY  28.35 g  1        Review of Systems     Objective:   Physical Exam  Vitals reviewed. Constitutional: She is oriented to person, place, and time. No distress.  HENT:  Head: Normocephalic and atraumatic.  Mouth/Throat: Oropharynx is clear and moist. No oropharyngeal exudate.  Eyes: Pupils are equal, round, and reactive to  light. No scleral icterus.  Neck: Normal range of motion. Neck supple.  Cardiovascular: Normal rate, regular rhythm and normal heart sounds.   Pulmonary/Chest: Effort normal and breath sounds normal. No respiratory distress.  Abdominal: Soft. Bowel sounds are normal. She exhibits no distension and no mass. There is no tenderness.  INCISIONS WELL HEALED. STITCHES BREAKING THROUGH OSTOMY INCISION. NON-TENDER W/O ERYTHEMA.    Musculoskeletal: Normal range of motion. She exhibits no edema.  Lymphadenopathy:    She has no cervical adenopathy.  Neurological: She is alert and oriented to person, place, and time.  NO FOCAL DEFICITS   Psychiatric: She has a normal mood and affect.          Assessment & Plan:

## 2013-07-16 NOTE — Assessment & Plan Note (Addendum)
MOST LIKELY DUE TO OSMOTIC DIARRHEA, less likely infectious.  CHECK STOOL FOR GIARDIA AG. Take PAREGORIC 1/2 TSP 30 MINS PRIOR TO MEALS AND AT BEDTIME. USE ONE IMODIUM AND LOMOTIL UP TO FOUR TIMES A DAY AS NEEDED FOR BOWEL MOVEMENTS. ADD [employer]S PROBIOTIC DAILY. ADD OMEPRAZOLE BID FOR ONE MONTH THEN ONCE DAILY CONTINUE BOOST TWICE DAILY. USE LOTRIMIN CREAM FOUR TIMES A DAY FOR TO TREAT ITCHING IN YOUR RECTUM. USE PROCTOSOL CREAM TWICE DAILY FOR 10 DAYS FOR RECTAL PAIN. DO NOT USE FOR MORE THAN 10 DAYS IN A ROW. USE ONLY 3 OR 4 TIMES A YEAR.  FOLLOW UP IN 2 MOS.   DISCUSSED MEDICATION MANAGEMENT WITH PHARMACY. DISCUSSED HISTORY AND PRIOR MANAGEMENT WITH DR. BYERLY. TIME SPENT TAKING HPI, DISCUSSING MANAGEMENT WITH PT & HUSBAND, PHARMACY, AND DR. BYERLY: 1 HR 30 MINS.

## 2013-07-16 NOTE — Patient Instructions (Addendum)
SUBMIT STOOL SAMPLE.  Take PAREGORIC 1/2 TSP 30 MINS PRIOR TO MEALS AND AT BEDTIME.  USE ONE IMODIUM AND LOMOTIL UP TO FOUR TIMES A DAY AS NEEDED FOR BOWEL MOVEMENTS.  ADD WALGREEN'S PROBIOTIC DAILY.  ADD OMEPRAZOLE BID FOR ONE MONTH THEN ONCE DAILY  CONTINUE BOOST TWICE DAILY.  USE LOTRIMIN CREAM FOUR TIMES A DAY FOR TO TREAT ITCHING IN YOUR RECTUM.  USE ANUSOL CREAM TWICE DAILY FOR 10 DAYS FOR RECTAL PAIN. DO NOT USE FOR MORE THAN 10 DAYS IN A ROW. USE ONLY 3 OR 4 TIMES A YEAR.  FOLLOW UP IN 2 MOS.

## 2013-07-17 ENCOUNTER — Telehealth: Payer: Self-pay

## 2013-07-17 NOTE — Progress Notes (Signed)
cc'd to pcp 

## 2013-07-17 NOTE — Telephone Encounter (Signed)
Order has been signed and faxed

## 2013-07-17 NOTE — Telephone Encounter (Signed)
T/C from [facility]. She was able to get all of the info that she needed from the computer. Just needs order faxed to [phone]   Attn Pharmacy Order is on Dr. Evelina Dun desk ready to be signed

## 2013-07-17 NOTE — Telephone Encounter (Signed)
T/C from Alroy Bailiff with [facility]  ( Phone number [phone])   Orders for TPN should read:  TPN per [facility] May dose  TPN and follow [facility] protocol for labs and porta cath care  Needs documentation of weight loss Ht Wt Allergies  Please call Bonita Quin when ready to fax and she will give fax number of her location at that time

## 2013-07-17 NOTE — Telephone Encounter (Signed)
Order complete. 

## 2013-07-18 ENCOUNTER — Emergency Department (HOSPITAL_COMMUNITY)
Admission: EM | Admit: 2013-07-18 | Discharge: 2013-07-18 | Disposition: A | Discharge status: Home / Self Care | Payer: BC Managed Care – PPO | Attending: Emergency Medicine | Admitting: Emergency Medicine

## 2013-07-18 ENCOUNTER — Encounter (HOSPITAL_COMMUNITY): Payer: Self-pay | Admitting: Emergency Medicine

## 2013-07-18 ENCOUNTER — Telehealth: Payer: Self-pay | Admitting: Internal Medicine

## 2013-07-18 DIAGNOSIS — Z930 Tracheostomy status: Secondary | ICD-10-CM | POA: Insufficient documentation

## 2013-07-18 DIAGNOSIS — Z932 Ileostomy status: Secondary | ICD-10-CM | POA: Insufficient documentation

## 2013-07-18 DIAGNOSIS — Z85048 Personal history of other malignant neoplasm of rectum, rectosigmoid junction, and anus: Secondary | ICD-10-CM | POA: Insufficient documentation

## 2013-07-18 DIAGNOSIS — Z923 Personal history of irradiation: Secondary | ICD-10-CM | POA: Insufficient documentation

## 2013-07-18 DIAGNOSIS — Z452 Encounter for adjustment and management of vascular access device: Secondary | ICD-10-CM | POA: Insufficient documentation

## 2013-07-18 DIAGNOSIS — Z79899 Other long term (current) drug therapy: Secondary | ICD-10-CM | POA: Insufficient documentation

## 2013-07-18 DIAGNOSIS — Z862 Personal history of diseases of the blood and blood-forming organs and certain disorders involving the immune mechanism: Secondary | ICD-10-CM | POA: Insufficient documentation

## 2013-07-18 MED ORDER — HEPARIN SOD (PORK) LOCK FLUSH 100 UNIT/ML IV SOLN
500.0000 [IU] | Freq: Once | INTRAVENOUS | Status: AC
  Administered 2013-07-18: 500 [IU]
  Filled 2013-07-18: qty 5

## 2013-07-18 NOTE — ED Notes (Signed)
Pt sent over by Dr. Darrick Penna to have porta cath checked. Home health nurse came to home and tried to access it and did not get any blood return. Pt states the port has not been used in a while.

## 2013-07-18 NOTE — ED Provider Notes (Signed)
CSN: [id number]     Arrival date & time 07/18/13  7829 History   First MD Initiated Contact with Patient 07/18/13 1949     This chart was scribed for Charles B. Bernette Mayers, MD by Manuela Schwartz, ED scribe. This patient was seen in room APA19/APA19 and the patient's care was started at 1852.  Chief Complaint  Patient presents with  . Follow-up   The history is provided by the patient. No language interpreter was used.   HPI Comments: Renee Nicholson is a 47 y.o. female who presents to the Emergency Department complaining of malfunctioning porta cath, sent here by Dr. Darrick Penna. Home nurse tonight could not access it at home and that her port has not been used in a while. She denies any fever/chills and no redness around placement of porta cath.  Past Medical History  Diagnosis Date  . Anemia   . Diarrhea   . Bright red rectal bleeding 09/13/2011  . History of chemotherapy     completed 09/2011   . Hx of radiation therapy 08/29/11 to 10/09/11    rectum  . Ileostomy in place   . Cancer   . Rectal cancer   . Hx of tracheostomy     12/2012   Past Surgical History  Procedure Laterality Date  . Tubal ligation    . Carpal tunnel release    . Colonoscopy  07/26/2011    Procedure: COLONOSCOPY;  Surgeon: Arlyce Harman, MD;  [facility];  Service: Endoscopy;  Laterality: N/A;  10:40  . Esophagogastroduodenoscopy  07/26/2011    Procedure: ESOPHAGOGASTRODUODENOSCOPY (EGD);  Surgeon: Arlyce Harman, MD;  [facility];  Service: Endoscopy;  Laterality: N/A;  . Esophagogastroduodenoscopy  12/27/2011    Procedure: ESOPHAGOGASTRODUODENOSCOPY (EGD);  Surgeon: Shirley Friar, MD;  Location: [facility];  Service: Endoscopy;  Laterality: N/A;  . Laparoscopic colon resection  12/14/11    diverting ileostomy  . Portacath placement  02/21/2012    Procedure: INSERTION PORT-A-CATH;  Surgeon: [name], MD;  [facility];  Service: General;  Laterality: N/A;  . Evaluation under  anesthesia with anal fissurotomy N/A 11/06/2012    Procedure: Exam Under Anesthesia , Anal Dilation;  Surgeon: [name], MD;  Location: [facility];  Service: General;  Laterality: N/A;  Exam Under Anesthesia , Anal Dilation  . Laparotomy N/A 01/03/2013    Procedure: EXPLORATORY LAPAROTOMY WITH LYSIS OF ADHESIONS, revision of ileostomy;  Surgeon: [name], MD;  [facility];  Service: General;  Laterality: N/A;  . Ileostomy closure N/A 04/24/2013    Procedure: ILEOSTOMY TAKEDOWN;  Surgeon: [name], MD;  Location: [facility];  Service: General;  Laterality: N/A;  . I&d extremity Left 05/31/2013    Procedure: IRRIGATION AND DEBRIDEMENT EXTREMITY;  Surgeon: Sharma Covert, MD;  [facility];  Service: Orthopedics;  Laterality: Left;  . Open reduction internal fixation (orif) metacarpal Left 05/31/2013    Procedure: OPEN REDUCTION INTERNAL FIXATION (ORIF) METACARPAL;  Surgeon: Sharma Covert, MD;  [facility];  Service: Orthopedics;  Laterality: Left;   Family History  Problem Relation Age of Onset  . Diabetes Mother   . Colon cancer Neg Hx   . Cancer Brother 35    rectal cancer lives in [place]   History  Substance Use Topics  . Smoking status: Never Smoker   . Smokeless tobacco: Never Used  . Alcohol Use: No   OB History   Grav Para Term Preterm Abortions TAB SAB Ect  Mult Living                 Review of Systems  All other systems reviewed and are negative.   A complete 10 system review of systems was obtained and all systems are negative except as noted in the HPI and PMH.   Allergies  Review of patient's allergies indicates no known allergies.  Home Medications   Current Outpatient Rx  Name  Route  Sig  Dispense  Refill  . B Complex-C (B-COMPLEX WITH VITAMIN C) tablet   Oral   Take 1 tablet by mouth daily.   30 tablet   1   . diphenoxylate-atropine (LOMOTIL) 2.5-0.025 MG per tablet   Oral   Take 1 tablet by mouth 4 (four) times daily.   30 tablet   0   .  megestrol (MEGACE) 40 MG/ML suspension      TAKE TWO TEASPOONSFUL DAILY   240 mL   1   . Multiple Vitamin (MULTIVITAMIN WITH MINERALS) TABS   Oral   Take 1 tablet by mouth daily.         Marland Kitchen omeprazole (PRILOSEC) 20 MG capsule      1 po bid FOR ONE MONTH THEN ONE DAILY FOR 3 MOS.   60 capsule   11   . Opium Tincture, Paregoric, 2 MG/5ML TINC   Oral   Take 2.5 mLs by mouth 4 (four) times daily -  before meals and at bedtime. TAKE 30 MINS BEFORE MEALS.   300 mL   0   . OVER THE COUNTER MEDICATION   Oral   Take 1 drop by mouth daily as needed (gas).         Marland Kitchen PROCTOSOL HC 2.5 % rectal cream      APPLY RECTALLY TWO TIMES DAILY   28.35 g   1    Triage Vitals: BP 117/66  Pulse 68  Temp(Src) 98.2 F (36.8 C) (Oral)  Resp 18  Ht 4\' 9"  (1.448 m)  Wt 76 lb (34.473 kg)  BMI 16.44 kg/m2  SpO2 100%  LMP 02/20/2012 Physical Exam  Nursing note and vitals reviewed. Constitutional: She is oriented to person, place, and time. She appears well-developed and well-nourished.  HENT:  Head: Normocephalic and atraumatic.  Neck: Neck supple.  Cardiovascular: Normal rate.   Pulmonary/Chest: Effort normal. No respiratory distress.  Neurological: She is alert and oriented to person, place, and time. No cranial nerve deficit.  Psychiatric: She has a normal mood and affect. Her behavior is normal.    ED Course  Procedures (including critical care time) DIAGNOSTIC STUDIES: Oxygen Saturation is 100% on room air, normal by my interpretation.    COORDINATION OF CARE: At 745 PM Discussed treatment plan with patient which includes attempt to flush porta cath for access. Patient agrees.   Labs Review Labs Reviewed - No data to display Imaging Review No results found.  EKG Interpretation   None       MDM   1. Encounter for care related to Port-a-Cath    Per ED nurse, she was able to flush but not withdraw blood from [place]. The patient is not in need of emergent use of the  Port for now, advised to return on Monday for outpatient fluoro confirmation of placement before beginning TPN.   I personally performed the services described in this documentation, which was scribed in my presence. The recorded information has been reviewed and is accurate.  Charles B. Bernette Mayers, MD 07/19/13 [phone]

## 2013-07-18 NOTE — ED Notes (Signed)
No blood return noted; 10ml NS flushes easily.  Patient denies pain while flushing port.

## 2013-07-18 NOTE — ED Notes (Signed)
Patient instructed to return Monday morning to have fluoro to confirm port-a-cath placement.  Patient ambulatory; discharged home in good condition.

## 2013-07-18 NOTE — Telephone Encounter (Signed)
Rae Roam w [facility] called about pt. TPN ordered by Dr. Darrick Penna. She tells me porta cath has not been used or maintained in 3 mos; she cannot get any blood return.  Pt in no acute distress - tolerating oral intake--Boost,etc.  I recommended she contact surgeon who placed porta-cath and if no luck there, consider taking pt to ED to see if they could get it going.

## 2013-07-19 NOTE — Progress Notes (Signed)
CALLED BY [facility]. PORT WON'T ASPIRATE. SENT TO ED FOR EVALUATION. FLUSHED & DON'T GET ANY BLOOD.  RADIOLOGY APPT 9 AM TO CHECK THE POSITION. IF IN CORRECT PT, PT WILL NEED TPA IN PORT.  SPOKE WITH HUSBAND. PT/HUSBAND  WILL COME TO SHORT STAY AFTER XRAY.

## 2013-07-21 ENCOUNTER — Other Ambulatory Visit (HOSPITAL_COMMUNITY): Payer: Self-pay | Admitting: Emergency Medicine

## 2013-07-21 ENCOUNTER — Encounter (HOSPITAL_COMMUNITY)
Admission: RE | Admit: 2013-07-21 | Discharge: 2013-07-21 | Disposition: A | Discharge status: Home / Self Care | Payer: BC Managed Care – PPO | Source: Ambulatory Visit | Attending: Gastroenterology | Admitting: Gastroenterology

## 2013-07-21 ENCOUNTER — Ambulatory Visit (HOSPITAL_COMMUNITY)
Admit: 2013-07-21 | Discharge: 2013-07-21 | Disposition: A | Discharge status: Home / Self Care | Payer: BC Managed Care – PPO | Source: Ambulatory Visit | Attending: Emergency Medicine | Admitting: Emergency Medicine

## 2013-07-21 DIAGNOSIS — Z95828 Presence of other vascular implants and grafts: Secondary | ICD-10-CM

## 2013-07-21 DIAGNOSIS — Z452 Encounter for adjustment and management of vascular access device: Secondary | ICD-10-CM | POA: Insufficient documentation

## 2013-07-21 DIAGNOSIS — Y849 Medical procedure, unspecified as the cause of abnormal reaction of the patient, or of later complication, without mention of misadventure at the time of the procedure: Secondary | ICD-10-CM | POA: Insufficient documentation

## 2013-07-21 DIAGNOSIS — T82898A Other specified complication of vascular prosthetic devices, implants and grafts, initial encounter: Secondary | ICD-10-CM | POA: Insufficient documentation

## 2013-07-21 LAB — GIARDIA ANTIGEN: Giardia Screen (EIA): NEGATIVE

## 2013-07-21 MED ORDER — HEPARIN SOD (PORK) LOCK FLUSH 100 UNIT/ML IV SOLN: 250.0000 [IU] | INTRAVENOUS | Status: DC | PRN

## 2013-07-21 MED ORDER — HEPARIN SOD (PORK) LOCK FLUSH 100 UNIT/ML IV SOLN
INTRAVENOUS | Status: AC
  Filled 2013-07-21: qty 5

## 2013-07-21 MED ORDER — IOHEXOL 350 MG/ML SOLN
50.0000 mL | Freq: Once | INTRAVENOUS | Status: AC | PRN
  Administered 2013-07-21: 10 mL via INTRAVENOUS

## 2013-07-21 MED ORDER — STERILE WATER FOR INJECTION IJ SOLN
INTRAMUSCULAR | Status: AC
  Filled 2013-07-21: qty 10

## 2013-07-21 MED ORDER — ALTEPLASE 100 MG IV SOLR
2.0000 mg | Freq: Once | INTRAVENOUS | Status: AC
  Administered 2013-07-21: 2 mg
  Filled 2013-07-21: qty 2

## 2013-07-21 MED ORDER — HEPARIN SOD (PORK) LOCK FLUSH 100 UNIT/ML IV SOLN
500.0000 [IU] | INTRAVENOUS | Status: AC | PRN
  Administered 2013-07-21: 500 [IU]

## 2013-07-21 MED ORDER — SODIUM CHLORIDE 0.9 % IJ SOLN
10.0000 mL | INTRAMUSCULAR | Status: AC | PRN
  Administered 2013-07-21: 10 mL

## 2013-07-21 MED ORDER — ALTEPLASE 2 MG IJ SOLR
INTRAMUSCULAR | Status: AC
  Filled 2013-07-21: qty 2

## 2013-07-21 MED ORDER — SODIUM CHLORIDE 0.9 % IJ SOLN: 10.0000 mL | INTRAMUSCULAR | Status: DC | PRN

## 2013-07-21 NOTE — Telephone Encounter (Signed)
SPOKE WITH KATHY OCT 24. PT SENT TO ED-PORTACATH FLUSHED, NO RETURN.  SPOKE WITH PT OCT 25. COMING OCT 27 FOR XRAY AND TPA.

## 2013-07-22 ENCOUNTER — Encounter: Payer: Self-pay | Admitting: General Practice

## 2013-07-22 NOTE — Progress Notes (Signed)
Appt. Is 12/3 with SF 

## 2013-08-12 ENCOUNTER — Telehealth (INDEPENDENT_AMBULATORY_CARE_PROVIDER_SITE_OTHER): Payer: Self-pay

## 2013-08-12 NOTE — Telephone Encounter (Signed)
12:5 in one of the cancer slots.

## 2013-08-12 NOTE — Telephone Encounter (Signed)
Patient husband cancelled appointment asking to be rescheduled . Advised the 1st av available is mid dec . Patient still cancelled appointment but wants to rescheduled asap. Please advise

## 2013-08-12 NOTE — Progress Notes (Unsigned)
Pt's labs received by fax on 08/11/2013. Hemoglobin up from 10.7 on 05/30/2013 to 11.4 . Lab orders placed on Dr. Evelina Dun chair for her review when she returns tomorrow.

## 2013-08-13 ENCOUNTER — Other Ambulatory Visit: Payer: Self-pay | Admitting: Gastroenterology

## 2013-08-15 ENCOUNTER — Encounter (INDEPENDENT_AMBULATORY_CARE_PROVIDER_SITE_OTHER): Payer: BC Managed Care – PPO | Admitting: General Surgery

## 2013-08-18 ENCOUNTER — Encounter (INDEPENDENT_AMBULATORY_CARE_PROVIDER_SITE_OTHER): Payer: BC Managed Care – PPO | Admitting: General Surgery

## 2013-08-20 ENCOUNTER — Other Ambulatory Visit (HOSPITAL_COMMUNITY): Payer: Self-pay | Admitting: General Surgery

## 2013-08-27 ENCOUNTER — Ambulatory Visit: Payer: BC Managed Care – PPO | Admitting: Gastroenterology

## 2013-08-29 ENCOUNTER — Encounter (INDEPENDENT_AMBULATORY_CARE_PROVIDER_SITE_OTHER): Payer: Self-pay

## 2013-08-29 ENCOUNTER — Encounter (INDEPENDENT_AMBULATORY_CARE_PROVIDER_SITE_OTHER): Payer: Self-pay | Admitting: General Surgery

## 2013-08-29 ENCOUNTER — Ambulatory Visit (INDEPENDENT_AMBULATORY_CARE_PROVIDER_SITE_OTHER): Payer: BC Managed Care – PPO | Admitting: General Surgery

## 2013-08-29 VITALS — BP 110/70 | HR 76 | Temp 98.2°F | Resp 14 | Ht <= 58 in | Wt 92.6 lb

## 2013-08-29 DIAGNOSIS — K644 Residual hemorrhoidal skin tags: Secondary | ICD-10-CM

## 2013-08-29 DIAGNOSIS — C20 Malignant neoplasm of rectum: Secondary | ICD-10-CM

## 2013-08-29 DIAGNOSIS — R197 Diarrhea, unspecified: Secondary | ICD-10-CM

## 2013-08-29 DIAGNOSIS — E43 Unspecified severe protein-calorie malnutrition: Secondary | ICD-10-CM

## 2013-08-29 MED ORDER — OPIUM TINCTURE (PAREGORIC) 2 MG/5ML PO TINC: ORAL | Status: DC

## 2013-08-29 MED ORDER — ZINC OXIDE 20 % EX OINT: 1.0000 "application " | TOPICAL_OINTMENT | Freq: Three times a day (TID) | CUTANEOUS | Status: DC

## 2013-08-29 NOTE — Assessment & Plan Note (Signed)
The external hemorrhoids does not seem tender. I think that the discomfort is coming from her anal sphincter at the site of the anastomosis.  I will switch her over to Gerhardt's butt cream. I will need her to stop the Anusol.  I will see her back in 3 months.

## 2013-08-29 NOTE — Assessment & Plan Note (Signed)
Patient appears to be doing well. She has gained 20 pounds.  She is seeing Dr. Clovis Riley on Tuesday of next week. At that point, she likely will be able to stop the TNA.

## 2013-08-29 NOTE — Progress Notes (Signed)
HISTORY: Patient is a 47 year old female with a history of rectal cancer whose history is quite complex.  She has had a total abdominal colectomy with ileal pouch anal anastomosis and is status post ileostomy takedown. As anticipated, she does have issues with diarrhea. She has 6-7 stools per day. She is no longer waking up in the middle of the night to have bowel movements very often.  She is not having fevers or chills. She is avoiding high-fiber foods. She has been on TNA due to continued issues with eating.  She has gained 20 pounds. She continues to have significant burning that feels like it's on the inside of her anus.   PERTINENT REVIEW OF SYSTEMS: Otherwise negative x 11.    Filed Vitals:   08/29/13 1141  BP: 110/70  Pulse: 76  Temp: 98.2 F (36.8 C)  Resp: 14   Filed Weights   08/29/13 1141  Weight: 92 lb 9.6 oz (42.003 kg)     EXAM: Head: Normocephalic and atraumatic.  Eyes:  Conjunctivae are normal. Pupils are equal, round, and reactive to light. No scleral icterus.   Resp: No respiratory distress, normal effort. Abd:  Abdomen is soft, non distended and non tender. No masses are palpable.  There is no rebound and no guarding. tips of sutures in ostomy site are trimmed.  No evidence of hernia Rectal:  Small anterior hemorrhoid.  Minimal irritation around anus.  Slightly tight sphincter tone, but not significantly strictured.   Neurological: Alert and oriented to person, place, and time. Coordination normal.  Skin: Skin is warm and dry. No rash noted. No diaphoretic. No erythema. No pallor.  Psychiatric: Normal mood and affect. Normal behavior. Judgment and thought content normal.      ASSESSMENT AND PLAN:   Hemorrhoids, external The external hemorrhoids does not seem tender. I think that the discomfort is coming from her anal sphincter at the site of the anastomosis.  I will switch her over to Gerhardt's butt cream. I will need her to stop the Anusol.  I will see  her back in 3 months.  Diarrhea I advised her to go up slightly on the paregoric at lunch and dinner. She should continue the Imodium.  Protein-calorie malnutrition, severe Patient appears to be doing well. She has gained 20 pounds.  She is seeing Dr. Clovis Riley on Tuesday of next week. At that point, she likely will be able to stop the TNA.      Maudry Diego, MD Surgical Oncology, General & Endocrine Surgery [facility]  [name], MD [name], MD

## 2013-08-29 NOTE — Assessment & Plan Note (Signed)
I advised her to go up slightly on the paregoric at lunch and dinner. She should continue the Imodium.

## 2013-08-29 NOTE — Patient Instructions (Signed)
Increase paregoric to 4 mL at lunch and dinner.  Keep the 2.5 mL at breakfast and bedtime.  Use compounded cream to alleviate discomfort at anus.  Follow up in 3-6 months.

## 2013-09-01 ENCOUNTER — Encounter (HOSPITAL_COMMUNITY)
Admission: RE | Admit: 2013-09-01 | Discharge: 2013-09-01 | Disposition: A | Discharge status: Home / Self Care | Payer: BC Managed Care – PPO | Source: Ambulatory Visit | Attending: Gastroenterology | Admitting: Gastroenterology

## 2013-09-01 ENCOUNTER — Telehealth: Payer: Self-pay

## 2013-09-01 ENCOUNTER — Telehealth (INDEPENDENT_AMBULATORY_CARE_PROVIDER_SITE_OTHER): Payer: Self-pay

## 2013-09-01 NOTE — Telephone Encounter (Signed)
Notified Andy at [facility] to substitute Dibucaine OTC ointment.  He will let the pt know about this change.

## 2013-09-01 NOTE — Telephone Encounter (Signed)
Hhmmmm..  Try dibucaine ointment.

## 2013-09-01 NOTE — Telephone Encounter (Signed)
Mardelle Matte with [facility] called back and states insurance will not cover the compound medication of hydrocortisone, nystatin and zinc Oxide and he would like the order to fill these medications uncompounded. Please advise. [phone].

## 2013-09-01 NOTE — Telephone Encounter (Signed)
Pharmacy called regarding compound prescription that was written. Asked what % of hydrocortisone cream; 1% or 2.5%. Let her know it has 2.5% hydrocortisone cream under medication list.

## 2013-09-01 NOTE — Telephone Encounter (Signed)
VM from [place] at the TPN pharmacy. She said that pt was under the impression that the TPN order will be D/c'd. However, pt has an appt with Dr. Darrick Penna on 09/03/2013 and she would like a call from Dr. Darrick Penna after that office visit to let her know if it will be continued. She will need new orders if it is continued. She can be reached at [phone] x [phone].

## 2013-09-02 NOTE — Telephone Encounter (Signed)
REVIEWED.  

## 2013-09-03 ENCOUNTER — Telehealth: Payer: Self-pay | Admitting: Oncology

## 2013-09-03 ENCOUNTER — Encounter: Payer: Self-pay | Admitting: Gastroenterology

## 2013-09-03 ENCOUNTER — Ambulatory Visit (INDEPENDENT_AMBULATORY_CARE_PROVIDER_SITE_OTHER): Payer: BC Managed Care – PPO | Admitting: Gastroenterology

## 2013-09-03 ENCOUNTER — Ambulatory Visit: Payer: BC Managed Care – PPO | Admitting: Gastroenterology

## 2013-09-03 VITALS — BP 86/57 | HR 70 | Temp 97.7°F | Ht <= 58 in | Wt 93.4 lb

## 2013-09-03 DIAGNOSIS — E43 Unspecified severe protein-calorie malnutrition: Secondary | ICD-10-CM

## 2013-09-03 DIAGNOSIS — C20 Malignant neoplasm of rectum: Secondary | ICD-10-CM

## 2013-09-03 NOTE — Assessment & Plan Note (Addendum)
SX IMPROVED.  NO TPN AFTER THUR PM. OPV IN 1 MO. CONSIDER RE-STARTING TPN IF PT UNABLE TO MAINATAIN HER WEIGHT. CONTINUE TO FLUSH PORT. APPT MADE.

## 2013-09-03 NOTE — Progress Notes (Signed)
cc'd to pcp 

## 2013-09-03 NOTE — Telephone Encounter (Signed)
, °

## 2013-09-03 NOTE — Progress Notes (Signed)
Subjective:    Patient ID: Renee Nicholson, female    DOB: 11-19-1965, 47 y.o.   MRN: [id number] VYAS,DHRUV B., MD  HPI Weight up. Diarrhea a little. Gas smelly. ENERGY LEVEL GOOD. GIVEN CREAM FOR BURNING. HAS BURNING AFTER BMs AND LASTS 30 MINS. STOOLS 1 IN AM & 5 OR 6: USU A #3, 5, OR 7 THROUGHOUT THE DAY. NO NOCTURNAL STOOLS. STILL TAKING [employer] PROBIOT[employer] & OMEPRAZOLE. APPETITE PRETTY GOOD.  FEELS GOOD ABOUT WEIGHT GAIN-77 TO 93 LBS.  INTERESTED IN STOPPING TPN FOR THE HOLIDAYS.  PT DENIES FEVER, CHILLS, BRBPR, nausea, vomiting, melena, diarrhea, constipation, abd pain, problems swallowing, problems with sedation, heartburn or indigestion.   Past Medical History  Diagnosis Date  . Anemia   . Diarrhea   . Bright red rectal bleeding 09/13/2011  . History of chemotherapy     completed 09/2011   . Hx of radiation therapy 08/29/11 to 10/09/11    rectum  . Ileostomy in place   . Cancer   . Rectal cancer   . Hx of tracheostomy     12/2012   Past Surgical History  Procedure Laterality Date  . Tubal ligation    . Carpal tunnel release    . Colonoscopy  07/26/2011    Procedure: COLONOSCOPY;  Surgeon: Arlyce Harman, MD;  [facility];  Service: Endoscopy;  Laterality: N/A;  10:40  . Esophagogastroduodenoscopy  07/26/2011    Procedure: ESOPHAGOGASTRODUODENOSCOPY (EGD);  Surgeon: Arlyce Harman, MD;  [facility];  Service: Endoscopy;  Laterality: N/A;  . Esophagogastroduodenoscopy  12/27/2011    Procedure: ESOPHAGOGASTRODUODENOSCOPY (EGD);  Surgeon: Shirley Friar, MD;  Location: [facility];  Service: Endoscopy;  Laterality: N/A;  . Laparoscopic colon resection  12/14/11    diverting ileostomy  . Portacath placement  02/21/2012    Procedure: INSERTION PORT-A-CATH;  Surgeon: [name], MD;  Location: [facility];  Service: General;  Laterality: N/A;  . Evaluation under anesthesia with anal fissurotomy N/A 11/06/2012    Procedure: Exam Under Anesthesia , Anal Dilation;   Surgeon: [name], MD;  Location: [facility];  Service: General;  Laterality: N/A;  Exam Under Anesthesia , Anal Dilation  . Laparotomy N/A 01/03/2013    Procedure: EXPLORATORY LAPAROTOMY WITH LYSIS OF ADHESIONS, revision of ileostomy;  Surgeon: [name], MD;  [facility];  Service: General;  Laterality: N/A;  . Ileostomy closure N/A 04/24/2013    Procedure: ILEOSTOMY TAKEDOWN;  Surgeon: [name], MD;  [facility];  Service: General;  Laterality: N/A;  . I&d extremity Left 05/31/2013    Procedure: IRRIGATION AND DEBRIDEMENT EXTREMITY;  Surgeon: Sharma Covert, MD;  [facility];  Service: Orthopedics;  Laterality: Left;  . Open reduction internal fixation (orif) metacarpal Left 05/31/2013    Procedure: OPEN REDUCTION INTERNAL FIXATION (ORIF) METACARPAL;  Surgeon: Sharma Covert, MD;  [facility];  Service: Orthopedics;  Laterality: Left;   No Known Allergies  Current Outpatient Prescriptions  Medication Sig Dispense Refill  . acidophilus (RISAQUAD) CAPS capsule Take by mouth daily.    . B Complex-C (B-COMPLEX WITH VITAMIN C) tablet Take 1 tablet by mouth daily.    .      . hydrocortisone cream-nystatin cream-zinc oxide Apply 1 application topically 3 (three) times daily.    Marland Kitchen loperamide (IMODIUM) 2 MG capsule TAKE TWO CAPSULES FOUR TIMES A DAY    . megestrol (MEGACE) 40 MG/ML suspension Take 200 mg by mouth daily.    Marland Kitchen  Multiple Vitamin (MULTIVITAMIN WITH MINERALS) TABS Take 1 tablet by mouth daily.    Marland Kitchen omeprazole (PRILOSEC) 20 MG capsule Take 20 mg by mouth daily.    Marland Kitchen Opium Tincture, Paregoric, 2 MG/5ML TINC Take 2.5 mL at breakfast and bedtime.  Take 4 mL at lunch and dinner.    Marland Kitchen OVER THE COUNTER MEDICATION Take 1 drop by mouth daily as needed (gas).                    Review of Systems     Objective:   Physical Exam  Vitals reviewed. Constitutional: She is oriented to person, place, and time. She appears well-nourished. No distress.  HENT:  Head:  Normocephalic and atraumatic.  Mouth/Throat: Oropharynx is clear and moist. No oropharyngeal exudate.  Eyes: Pupils are equal, round, and reactive to light. No scleral icterus.  Neck: Normal range of motion. Neck supple.  Cardiovascular: Normal rate, regular rhythm and normal heart sounds.   Pulmonary/Chest: Effort normal and breath sounds normal. No respiratory distress.  PORT ACCESSED IN LEFT CHEST  Abdominal: Soft. Bowel sounds are normal. She exhibits no distension. There is no tenderness.  Musculoskeletal: She exhibits no edema.  Lymphadenopathy:    She has no cervical adenopathy.  Neurological: She is alert and oriented to person, place, and time.  NO FOCAL DEFICITS   Psychiatric: She has a normal mood and affect.          Assessment & Plan:

## 2013-09-03 NOTE — Assessment & Plan Note (Signed)
CONSIDER FLEX SIG IN 2015 TO EXAMINE CUFF.

## 2013-09-03 NOTE — Patient Instructions (Addendum)
STOP TPN.  CONTINUE WITH INCREASED DOSE OF TINCTURE. USE IMODIUM OR LOMOTIL AS NEEDED FOR DIARRHEA.  CONTINUE RISAQUAD, MULTI-VITAMIN, AND IRON.  FOLLOW UP IN 1 MO.

## 2013-09-04 NOTE — Progress Notes (Signed)
Pt is aware of OV and card was given

## 2013-09-15 ENCOUNTER — Other Ambulatory Visit: Payer: BC Managed Care – PPO

## 2013-09-15 ENCOUNTER — Ambulatory Visit: Payer: BC Managed Care – PPO | Admitting: Oncology

## 2013-09-21 ENCOUNTER — Other Ambulatory Visit (INDEPENDENT_AMBULATORY_CARE_PROVIDER_SITE_OTHER): Payer: Self-pay | Admitting: General Surgery

## 2013-09-29 ENCOUNTER — Encounter: Payer: Self-pay | Admitting: Gastroenterology

## 2013-10-02 ENCOUNTER — Other Ambulatory Visit (INDEPENDENT_AMBULATORY_CARE_PROVIDER_SITE_OTHER): Payer: Self-pay | Admitting: General Surgery

## 2013-10-03 ENCOUNTER — Other Ambulatory Visit: Payer: Self-pay | Admitting: Gastroenterology

## 2013-10-03 ENCOUNTER — Telehealth: Payer: Self-pay | Admitting: *Deleted

## 2013-10-03 ENCOUNTER — Other Ambulatory Visit (INDEPENDENT_AMBULATORY_CARE_PROVIDER_SITE_OTHER): Payer: Self-pay | Admitting: General Surgery

## 2013-10-03 NOTE — Telephone Encounter (Signed)
Loma Sousa from [facility] called about pt's point of care forms that were sent to Dr Oneida Alar 30 days ago may be a little more form number [id number]. Loma Sousa would like to know if these have been filled out. Please advise Courtney at [phone] EXT. [phone]

## 2013-10-06 NOTE — Telephone Encounter (Signed)
I called and spoke to [employer]. She has not received the plan of care for pt. She will re-fax the request this afternoon.

## 2013-10-06 NOTE — Telephone Encounter (Signed)
REVIEWED.  

## 2013-10-06 NOTE — Telephone Encounter (Signed)
Pt is aware she will need to come and pick up prescription and Rx is at front.

## 2013-10-08 ENCOUNTER — Ambulatory Visit: Payer: BC Managed Care – PPO | Admitting: Gastroenterology

## 2013-10-08 NOTE — Telephone Encounter (Signed)
CALL [facility] AND CONFIRM THEY RECEIVED THE PAPERWORK.

## 2013-10-08 NOTE — Telephone Encounter (Signed)
I called [facility] and spoke to Scanlon. She checked and said there are no outstanding documents on the pt. They will call or fax if anything else is needed.

## 2013-10-08 NOTE — Telephone Encounter (Signed)
REVIEWED.  

## 2013-10-08 NOTE — Telephone Encounter (Signed)
Per Ginger, Dr. Oneida Alar filled out and faxed several papers yesterday for this.

## 2013-10-10 MED ORDER — HEPARIN SOD (PORK) LOCK FLUSH 100 UNIT/ML IV SOLN: 500.0000 [IU] | INTRAVENOUS | Status: DC | PRN

## 2013-10-10 MED ORDER — SODIUM CHLORIDE 0.9 % IJ SOLN: 10.0000 mL | INTRAMUSCULAR | Status: DC | PRN

## 2013-10-13 ENCOUNTER — Encounter (HOSPITAL_COMMUNITY)
Admission: RE | Admit: 2013-10-13 | Discharge: 2013-10-13 | Disposition: A | Discharge status: Home / Self Care | Payer: BC Managed Care – PPO | Source: Ambulatory Visit | Attending: Gastroenterology | Admitting: Gastroenterology

## 2013-10-13 DIAGNOSIS — Z452 Encounter for adjustment and management of vascular access device: Secondary | ICD-10-CM | POA: Insufficient documentation

## 2013-10-13 MED ORDER — SODIUM CHLORIDE 0.9 % IJ SOLN
10.0000 mL | INTRAMUSCULAR | Status: AC | PRN
  Administered 2013-10-13: 10 mL

## 2013-10-13 MED ORDER — HEPARIN SOD (PORK) LOCK FLUSH 100 UNIT/ML IV SOLN
INTRAVENOUS | Status: AC
  Filled 2013-10-13: qty 5

## 2013-10-13 MED ORDER — HEPARIN SOD (PORK) LOCK FLUSH 100 UNIT/ML IV SOLN
500.0000 [IU] | INTRAVENOUS | Status: AC | PRN
  Administered 2013-10-13: 500 [IU]

## 2013-10-13 NOTE — Discharge Instructions (Signed)

## 2013-10-13 NOTE — Progress Notes (Signed)
No blood return from [place] a cath, flushed easily . [facility] contacted as they were in charge to port a cath prior to clinic visit. Vicente Males stated port has not had blood return for "months". Dr. Woody Seller office contacted waiting orders.

## 2013-10-14 NOTE — Progress Notes (Signed)
Discussed the inability to draw back blood from port a cath, but easily flushes with Dr. Oneida Alar. Pt called with information concerning this issue. Dr. Oneida Alar, stated" as long as it flushed easily it is OK."  Husband verbalized understanding.

## 2013-10-16 NOTE — Telephone Encounter (Signed)
Prescription written out and placed at front desk for pick up.

## 2013-10-17 ENCOUNTER — Other Ambulatory Visit (HOSPITAL_BASED_OUTPATIENT_CLINIC_OR_DEPARTMENT_OTHER): Payer: BC Managed Care – PPO

## 2013-10-17 ENCOUNTER — Encounter: Payer: Self-pay | Admitting: Oncology

## 2013-10-17 ENCOUNTER — Telehealth: Payer: Self-pay | Admitting: Oncology

## 2013-10-17 ENCOUNTER — Ambulatory Visit (HOSPITAL_BASED_OUTPATIENT_CLINIC_OR_DEPARTMENT_OTHER): Payer: BC Managed Care – PPO | Admitting: Oncology

## 2013-10-17 VITALS — BP 110/76 | HR 69 | Temp 98.6°F | Resp 18 | Ht <= 58 in | Wt 94.0 lb

## 2013-10-17 DIAGNOSIS — C20 Malignant neoplasm of rectum: Secondary | ICD-10-CM

## 2013-10-17 LAB — COMPREHENSIVE METABOLIC PANEL (CC13)
ALK PHOS: 68 U/L (ref 40–150)
ALT: 13 U/L (ref 0–55)
AST: 18 U/L (ref 5–34)
Albumin: 3.4 g/dL — ABNORMAL LOW (ref 3.5–5.0)
Anion Gap: 9 mEq/L (ref 3–11)
BUN: 16 mg/dL (ref 7.0–26.0)
CO2: 26 mEq/L (ref 22–29)
CREATININE: 0.7 mg/dL (ref 0.6–1.1)
Calcium: 9.1 mg/dL (ref 8.4–10.4)
Chloride: 108 mEq/L (ref 98–109)
Glucose: 82 mg/dl (ref 70–140)
Potassium: 3.9 mEq/L (ref 3.5–5.1)
SODIUM: 142 meq/L (ref 136–145)
Total Bilirubin: 0.45 mg/dL (ref 0.20–1.20)
Total Protein: 6.2 g/dL — ABNORMAL LOW (ref 6.4–8.3)

## 2013-10-17 LAB — CBC WITH DIFFERENTIAL/PLATELET
BASO%: 0.6 % (ref 0.0–2.0)
Basophils Absolute: 0 10*3/uL (ref 0.0–0.1)
EOS ABS: 0.2 10*3/uL (ref 0.0–0.5)
EOS%: 3.9 % (ref 0.0–7.0)
HCT: 38.7 % (ref 34.8–46.6)
HGB: 13 g/dL (ref 11.6–15.9)
LYMPH%: 19.1 % (ref 14.0–49.7)
MCH: 28.7 pg (ref 25.1–34.0)
MCHC: 33.6 g/dL (ref 31.5–36.0)
MCV: 85.5 fL (ref 79.5–101.0)
MONO#: 0.7 10*3/uL (ref 0.1–0.9)
MONO%: 13.3 % (ref 0.0–14.0)
NEUT%: 63.1 % (ref 38.4–76.8)
NEUTROS ABS: 3.5 10*3/uL (ref 1.5–6.5)
PLATELETS: 235 10*3/uL (ref 145–400)
RBC: 4.52 10*6/uL (ref 3.70–5.45)
RDW: 14.7 % — ABNORMAL HIGH (ref 11.2–14.5)
WBC: 5.5 10*3/uL (ref 3.9–10.3)
lymph#: 1 10*3/uL (ref 0.9–3.3)
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
## 2013-10-23 ENCOUNTER — Other Ambulatory Visit (INDEPENDENT_AMBULATORY_CARE_PROVIDER_SITE_OTHER): Payer: Self-pay | Admitting: *Deleted

## 2013-10-23 ENCOUNTER — Other Ambulatory Visit (INDEPENDENT_AMBULATORY_CARE_PROVIDER_SITE_OTHER): Payer: Self-pay | Admitting: General Surgery

## 2013-10-23 ENCOUNTER — Ambulatory Visit: Payer: BC Managed Care – PPO | Admitting: Gastroenterology

## 2013-10-23 ENCOUNTER — Telehealth (INDEPENDENT_AMBULATORY_CARE_PROVIDER_SITE_OTHER): Payer: Self-pay | Admitting: *Deleted

## 2013-10-23 MED ORDER — DIPHENOXYLATE-ATROPINE 2.5-0.025 MG PO TABS: 2.0000 | ORAL_TABLET | Freq: Four times a day (QID) | ORAL | Status: DC

## 2013-10-23 MED ORDER — DIPHENOXYLATE-ATROPINE 2.5-0.025 MG PO TABS: 1.0000 | ORAL_TABLET | Freq: Three times a day (TID) | ORAL | Status: DC

## 2013-10-23 NOTE — Telephone Encounter (Signed)
Give 12 refills.

## 2013-10-23 NOTE — Telephone Encounter (Signed)
Received a request from the pharmacy to refill Lomotil.  Is this ok to refill?

## 2013-10-23 NOTE — Telephone Encounter (Signed)
Prescription filled out and awaiting a signature by a doctor in the office.  Will update patient once prescription is signed and ready for pickup.

## 2013-10-23 NOTE — Telephone Encounter (Signed)
Called patient's pharmacy, [facility], and called in the prescription so that patient does not have to come pick up.  Only able to call in #5 refills due to controlled substance.

## 2013-10-29 ENCOUNTER — Ambulatory Visit: Payer: BC Managed Care – PPO | Admitting: Gastroenterology

## 2013-10-30 ENCOUNTER — Ambulatory Visit (INDEPENDENT_AMBULATORY_CARE_PROVIDER_SITE_OTHER): Payer: BC Managed Care – PPO | Admitting: Gastroenterology

## 2013-10-30 ENCOUNTER — Encounter (INDEPENDENT_AMBULATORY_CARE_PROVIDER_SITE_OTHER): Payer: Self-pay

## 2013-10-30 ENCOUNTER — Encounter: Payer: Self-pay | Admitting: Gastroenterology

## 2013-10-30 VITALS — BP 105/66 | HR 71 | Temp 98.1°F | Wt 95.0 lb

## 2013-10-30 DIAGNOSIS — K644 Residual hemorrhoidal skin tags: Secondary | ICD-10-CM

## 2013-10-30 DIAGNOSIS — R197 Diarrhea, unspecified: Secondary | ICD-10-CM

## 2013-10-30 DIAGNOSIS — C20 Malignant neoplasm of rectum: Secondary | ICD-10-CM

## 2013-10-30 DIAGNOSIS — E43 Unspecified severe protein-calorie malnutrition: Secondary | ICD-10-CM

## 2013-10-30 MED ORDER — ZINC OXIDE 20 % EX OINT: TOPICAL_OINTMENT | CUTANEOUS | Status: DC

## 2013-10-30 MED ORDER — OPIUM TINCTURE (PAREGORIC) 2 MG/5ML PO TINC: ORAL | Status: DC

## 2013-10-30 NOTE — Assessment & Plan Note (Signed)
ABLE TO MAINTAIN WEIGHT W/O TPN.  MONITOR WEIGHT. OPV IN 3 MOS

## 2013-10-30 NOTE — Assessment & Plan Note (Signed)
SX IMPROVED WITH NYASTATIN/ZNC OXIDE.  REFILL BUTT CREAM. FOLLOW UP WITH DR. BYERLY.

## 2013-10-30 NOTE — Assessment & Plan Note (Signed)
CONTROLLED WITH PAREGORIC & LOMOTIL/IMODIUM  REFILL PAREGORIC. OPV IN 3 MOS.

## 2013-10-30 NOTE — Assessment & Plan Note (Signed)
DISCUSS WITH DR. KHAN WHEN PORT-A-CATH NEED TO COME OUT.  FLUSH [id number]. LETTER FOR DISABILITY WRITTEN TODAY.

## 2013-10-30 NOTE — Patient Instructions (Signed)
SEE DR. BYERLY ABOUT YOUR EXTERNAL HEMORRHOIDS.  SEE DR. KHAN ABOUT WHEN TO TAKE OUT YOUR PORTA-CATH.  FOLLOW UP IN 3 MOS.

## 2013-10-30 NOTE — Progress Notes (Signed)
Subjective:    Patient ID: Renee Nicholson, female    DOB: 08-20-1966, 48 y.o.   MRN: [id number]  VYAS,DHRUV B., MD  HPI Has burning in her anus after BMs only. Not all the time. No diarrhea at night. Cream on bottom helps(NYASTATIN/ZNC OXIDE). PAREGORIC HELPS. BMS: 5 OR 6 A DAY AND SOMETIMES LESS. SOMETIMES JUST GAS. NO RECTAL BLEEDING. NO METASTATIC DISEASE. DISABILITY LETTER NEEDED.   Past Medical History  Diagnosis Date  . Anemia   . Diarrhea   . Bright red rectal bleeding 09/13/2011  . History of chemotherapy     completed 09/2011   . Hx of radiation therapy 08/29/11 to 10/09/11    rectum  . Ileostomy in place   . Cancer   . Rectal cancer   . Hx of tracheostomy     12/2012   Past Surgical History  Procedure Laterality Date  . Tubal ligation    . Carpal tunnel release    . Colonoscopy  07/26/2011    Procedure: COLONOSCOPY;  Surgeon: Dorothyann Peng, MD;  [facility];  Service: Endoscopy;  Laterality: N/A;  10:40  . Esophagogastroduodenoscopy  07/26/2011    Procedure: ESOPHAGOGASTRODUODENOSCOPY (EGD);  Surgeon: Dorothyann Peng, MD;  [facility];  Service: Endoscopy;  Laterality: N/A;  . Esophagogastroduodenoscopy  12/27/2011    Procedure: ESOPHAGOGASTRODUODENOSCOPY (EGD);  Surgeon: Lear Ng, MD;  Location: [facility];  Service: Endoscopy;  Laterality: N/A;  . Laparoscopic colon resection  12/14/11    diverting ileostomy  . Portacath placement  02/21/2012    Procedure: INSERTION PORT-A-CATH;  Surgeon: Stark Klein, MD;  Location: [place];  Service: General;  Laterality: N/A;  . Evaluation under anesthesia with anal fissurotomy N/A 11/06/2012    Procedure: Exam Under Anesthesia , Anal Dilation;  Surgeon: Stark Klein, MD;  Location: [facility];  Service: General;  Laterality: N/A;  Exam Under Anesthesia , Anal Dilation  . Laparotomy N/A 01/03/2013    Procedure: EXPLORATORY LAPAROTOMY WITH LYSIS OF ADHESIONS, revision of ileostomy;  Surgeon: Stark Klein, MD;   Location: [place];  Service: General;  Laterality: N/A;  . Ileostomy closure N/A 04/24/2013    Procedure: ILEOSTOMY TAKEDOWN;  Surgeon: Stark Klein, MD;  Location: [facility];  Service: General;  Laterality: N/A;  . I&d extremity Left 05/31/2013    Procedure: IRRIGATION AND DEBRIDEMENT EXTREMITY;  Surgeon: Linna Hoff, MD;  Location: [place];  Service: Orthopedics;  Laterality: Left;  . Open reduction internal fixation (orif) metacarpal Left 05/31/2013    Procedure: OPEN REDUCTION INTERNAL FIXATION (ORIF) METACARPAL;  Surgeon: Linna Hoff, MD;  Location: [place];  Service: Orthopedics;  Laterality: Left;   No Known Allergies  Current Outpatient Prescriptions  Medication Sig Dispense Refill  . acidophilus (RISAQUAD) CAPS capsule Take by mouth daily.      . B Complex-C (B-COMPLEX WITH VITAMIN C) tablet Take 1 tablet by mouth daily.    . diphenoxylate-atropine (LOMOTIL) 2.5-0.025 MG per tablet Take 2 tablets by mouth 4 (four) times daily.    . hydrocortisone (ANUSOL-HC) 2.5 % rectal cream Place 1 application rectally 2 (two) times daily.    . hydrocortisone cream-nystatin cream-zinc oxide Apply 1 application topically 3 (three) times daily.    Marland Kitchen loperamide (IMODIUM) 2 MG capsule TAKE TWO CAPSULES FOUR TIMES A DAY    . megestrol (MEGACE) 40 MG/ML suspension TAKE TWO TEASPOONSFUL DAILY    . Multiple Vitamin (MULTIVITAMIN WITH MINERALS) TABS Take 1 tablet by mouth daily.    Marland Kitchen  omeprazole (PRILOSEC) 20 MG capsule Take 20 mg by mouth daily.    Marland Kitchen Opium Tincture, Paregoric, 2 MG/5ML TINC TAKE 2.5 MLS BY MOUTH AT BREAKFAST AND BEDTIME. TAKE 4 MLS BY MOUTH AT LUNCH AND DINNER.    Marland Kitchen OVER THE COUNTER MEDICATION Take 1 drop by mouth daily as needed (gas).      Marland Kitchen OVER THE COUNTER MEDICATION       . megestrol (MEGACE) 40 MG/ML suspension Take 200 mg by mouth daily.      .          Review of Systems     Objective:   Physical Exam  Vitals reviewed. Constitutional: She is oriented to person, place, and time.  She appears well-nourished. No distress.  HENT:  Head: Normocephalic and atraumatic.  Mouth/Throat: Oropharynx is clear and moist. No oropharyngeal exudate.  Eyes: Pupils are equal, round, and reactive to light. No scleral icterus.  Neck: Normal range of motion. Neck supple.  Cardiovascular: Normal rate, regular rhythm and normal heart sounds.   Pulmonary/Chest: Effort normal and breath sounds normal. No respiratory distress.  Abdominal: Soft. Bowel sounds are normal. She exhibits no distension. There is no tenderness.  Musculoskeletal: She exhibits no edema.  Lymphadenopathy:    She has no cervical adenopathy.  Neurological: She is alert and oriented to person, place, and time.  NO FOCAL DEFICITS   Psychiatric: She has a normal mood and affect.          Assessment & Plan:

## 2013-10-30 NOTE — Progress Notes (Signed)
cc'd to pcp 

## 2013-11-04 NOTE — Progress Notes (Signed)
Reminder in epic °

## 2013-11-21 ENCOUNTER — Other Ambulatory Visit (INDEPENDENT_AMBULATORY_CARE_PROVIDER_SITE_OTHER): Payer: Self-pay | Admitting: General Surgery

## 2013-11-21 ENCOUNTER — Ambulatory Visit (INDEPENDENT_AMBULATORY_CARE_PROVIDER_SITE_OTHER): Payer: BC Managed Care – PPO | Admitting: General Surgery

## 2013-11-21 ENCOUNTER — Encounter (INDEPENDENT_AMBULATORY_CARE_PROVIDER_SITE_OTHER): Payer: Self-pay | Admitting: General Surgery

## 2013-11-21 VITALS — BP 130/80 | HR 64 | Resp 20 | Ht <= 58 in | Wt 97.4 lb

## 2013-11-21 DIAGNOSIS — R197 Diarrhea, unspecified: Secondary | ICD-10-CM

## 2013-11-21 DIAGNOSIS — K6289 Other specified diseases of anus and rectum: Secondary | ICD-10-CM

## 2013-11-21 DIAGNOSIS — K644 Residual hemorrhoidal skin tags: Secondary | ICD-10-CM

## 2013-11-21 DIAGNOSIS — C20 Malignant neoplasm of rectum: Secondary | ICD-10-CM

## 2013-11-21 NOTE — Patient Instructions (Signed)
Try Rectiv cream 2x/day.    Would use butt cream alternating with this (don't apply both at same time)  Follow up in 6 months unless Dr. Humphrey Rolls OK with port removal.

## 2013-11-21 NOTE — Assessment & Plan Note (Signed)
Refer back for pelvic floor PT.  May need to use anal dilators.

## 2013-11-21 NOTE — Progress Notes (Signed)
HISTORY: Patient is a 48 year old female with a history of rectal cancer whose history is quite complex.  She has had a total abdominal colectomy with ileal pouch anal anastomosis and is status post ileostomy takedown.  Her rectal discomfort has improved with the Gerhardt's butt cream.  She is down to 5-6 stools per day with paregoric and Lomotil. She is no longer waking up at night to have stools. She is off the TNA and her weight has remained stable. She is no longer requiring Megace for appetite stimulant. She feels hungry all of the time.   PERTINENT REVIEW OF SYSTEMS: Otherwise negative x 11.    Filed Vitals:   11/21/13 1113  BP: 130/80  Pulse: 64  Resp: 20   Filed Weights   11/21/13 1113  Weight: 97 lb 6.4 oz (44.18 kg)     EXAM: Head: Normocephalic and atraumatic.  Eyes:  Conjunctivae are normal. Pupils are equal, round, and reactive to light. No scleral icterus.   Resp: No respiratory distress, normal effort. Abd:  Abdomen is soft, non distended and non tender. No masses are palpable.  There is no rebound and no guarding. tips of sutures in ostomy site are trimmed.  No evidence of hernia Rectal:  Two anterior skin tags that do not seem tender with palpation.  Significant discomfort with digital exam.   Neurological: Alert and oriented to person, place, and time. Coordination normal.  Skin: Skin is warm and dry. No rash noted. No diaphoretic. No erythema. No pallor.  Psychiatric: Normal mood and affect. Normal behavior. Judgment and thought content normal.      ASSESSMENT AND PLAN:   Hemorrhoids, external At this point, these look like skin tags anteriorly.  I can certainly cut these off, but it seems as though her discomfort comes with digital exam, not with manipulation of the small skin tags.    We will try rectiv cream to see if this helps with sphincter discomfort. I gave her a sample to try.      Anal pain Refer back for pelvic floor PT.  May need to use anal  dilators.    Rectal cancer s/p TAC, IPAA, loop ileostomy on 12/14/11, ostomy revision 12/2012, ostomy takedown 04/24/2013 Pt without clinical evidence of disease.  Pt to see Dr. Humphrey Rolls in may.  Follow up with me in 6 months.    Diarrhea Continue paregoric and lomotil. Diarrhea expected with IPAA.  Controlled with these medications, down to 5-6 bms/day, none at night.         Milus Height, MD Surgical Oncology, [facility]  [name]., MD [name]., MD

## 2013-11-21 NOTE — Assessment & Plan Note (Signed)
Pt without clinical evidence of disease.  Pt to see Dr. Humphrey Rolls in may.  Follow up with me in 6 months.

## 2013-11-21 NOTE — Assessment & Plan Note (Addendum)
Continue paregoric and lomotil. Diarrhea expected with IPAA.  Controlled with these medications, down to 5-6 bms/day, none at night.

## 2013-11-21 NOTE — Assessment & Plan Note (Signed)
At this point, these look like skin tags anteriorly.  I can certainly cut these off, but it seems as though her discomfort comes with digital exam, not with manipulation of the small skin tags.    We will try rectiv cream to see if this helps with sphincter discomfort. I gave her a sample to try.

## 2013-11-24 ENCOUNTER — Encounter (HOSPITAL_COMMUNITY)
Admission: RE | Admit: 2013-11-24 | Discharge: 2013-11-24 | Disposition: A | Discharge status: Home / Self Care | Payer: BC Managed Care – PPO | Source: Ambulatory Visit | Attending: Gastroenterology | Admitting: Gastroenterology

## 2013-11-24 VITALS — BP 100/67 | HR 70 | Temp 98.1°F | Resp 22

## 2013-11-24 DIAGNOSIS — C20 Malignant neoplasm of rectum: Secondary | ICD-10-CM | POA: Insufficient documentation

## 2013-11-24 MED ORDER — HEPARIN SOD (PORK) LOCK FLUSH 100 UNIT/ML IV SOLN
500.0000 [IU] | Freq: Once | INTRAVENOUS | Status: AC
  Administered 2013-11-24: 500 [IU] via INTRAVENOUS
  Filled 2013-11-24: qty 5

## 2013-11-24 MED ORDER — SODIUM CHLORIDE 0.9 % IJ SOLN
10.0000 mL | INTRAMUSCULAR | Status: DC | PRN
  Administered 2013-11-24: 10 mL via INTRAVENOUS

## 2013-11-24 MED ORDER — HEPARIN SOD (PORK) LOCK FLUSH 100 UNIT/ML IV SOLN
INTRAVENOUS | Status: AC
  Filled 2013-11-24: qty 5

## 2013-11-26 ENCOUNTER — Encounter: Payer: Self-pay | Admitting: Gastroenterology

## 2013-11-26 ENCOUNTER — Ambulatory Visit: Payer: Medicare Other | Attending: General Surgery | Admitting: Physical Therapy

## 2013-11-26 DIAGNOSIS — IMO0001 Reserved for inherently not codable concepts without codable children: Secondary | ICD-10-CM | POA: Diagnosis present

## 2013-11-26 DIAGNOSIS — K6289 Other specified diseases of anus and rectum: Secondary | ICD-10-CM | POA: Insufficient documentation

## 2013-12-03 ENCOUNTER — Ambulatory Visit: Payer: Medicare Other | Admitting: Physical Therapy

## 2013-12-03 DIAGNOSIS — IMO0001 Reserved for inherently not codable concepts without codable children: Secondary | ICD-10-CM | POA: Diagnosis not present

## 2013-12-10 ENCOUNTER — Ambulatory Visit: Payer: Medicare Other | Admitting: Physical Therapy

## 2013-12-10 DIAGNOSIS — IMO0001 Reserved for inherently not codable concepts without codable children: Secondary | ICD-10-CM | POA: Diagnosis not present

## 2013-12-16 ENCOUNTER — Other Ambulatory Visit: Payer: Self-pay | Admitting: Gastroenterology

## 2013-12-16 ENCOUNTER — Ambulatory Visit: Payer: Medicare Other | Admitting: Physical Therapy

## 2013-12-16 DIAGNOSIS — IMO0001 Reserved for inherently not codable concepts without codable children: Secondary | ICD-10-CM | POA: Diagnosis not present

## 2013-12-18 ENCOUNTER — Ambulatory Visit: Payer: Medicare Other | Admitting: Physical Therapy

## 2013-12-18 DIAGNOSIS — IMO0001 Reserved for inherently not codable concepts without codable children: Secondary | ICD-10-CM | POA: Diagnosis not present

## 2013-12-23 ENCOUNTER — Ambulatory Visit: Payer: Medicare Other | Admitting: Physical Therapy

## 2013-12-23 DIAGNOSIS — IMO0001 Reserved for inherently not codable concepts without codable children: Secondary | ICD-10-CM | POA: Diagnosis not present

## 2013-12-25 ENCOUNTER — Ambulatory Visit: Payer: BC Managed Care – PPO | Attending: General Surgery | Admitting: Physical Therapy

## 2013-12-25 DIAGNOSIS — IMO0001 Reserved for inherently not codable concepts without codable children: Secondary | ICD-10-CM | POA: Insufficient documentation

## 2013-12-25 DIAGNOSIS — K6289 Other specified diseases of anus and rectum: Secondary | ICD-10-CM | POA: Diagnosis not present

## 2013-12-31 ENCOUNTER — Ambulatory Visit: Payer: BC Managed Care – PPO | Admitting: Physical Therapy

## 2013-12-31 DIAGNOSIS — IMO0001 Reserved for inherently not codable concepts without codable children: Secondary | ICD-10-CM | POA: Diagnosis not present

## 2014-01-01 ENCOUNTER — Ambulatory Visit: Payer: BC Managed Care – PPO | Admitting: Physical Therapy

## 2014-01-01 DIAGNOSIS — IMO0001 Reserved for inherently not codable concepts without codable children: Secondary | ICD-10-CM | POA: Diagnosis not present

## 2014-01-05 ENCOUNTER — Inpatient Hospital Stay (HOSPITAL_COMMUNITY): Admission: RE | Admit: 2014-01-05 | Payer: BC Managed Care – PPO | Source: Ambulatory Visit

## 2014-01-06 ENCOUNTER — Ambulatory Visit: Payer: BC Managed Care – PPO | Admitting: Physical Therapy

## 2014-01-06 DIAGNOSIS — IMO0001 Reserved for inherently not codable concepts without codable children: Secondary | ICD-10-CM | POA: Diagnosis not present

## 2014-01-07 ENCOUNTER — Encounter (HOSPITAL_COMMUNITY)
Admission: RE | Admit: 2014-01-07 | Discharge: 2014-01-07 | Disposition: A | Discharge status: Home / Self Care | Payer: Medicare Other | Source: Ambulatory Visit | Attending: Gastroenterology | Admitting: Gastroenterology

## 2014-01-07 DIAGNOSIS — Z452 Encounter for adjustment and management of vascular access device: Secondary | ICD-10-CM | POA: Insufficient documentation

## 2014-01-07 MED ORDER — SODIUM CHLORIDE 0.9 % IJ SOLN
10.0000 mL | Freq: Once | INTRAMUSCULAR | Status: AC
  Administered 2014-01-07: 10 mL via INTRAVENOUS

## 2014-01-07 MED ORDER — HEPARIN SOD (PORK) LOCK FLUSH 100 UNIT/ML IV SOLN
500.0000 [IU] | Freq: Once | INTRAVENOUS | Status: AC
  Administered 2014-01-07: 500 [IU] via INTRAVENOUS
  Filled 2014-01-07: qty 5

## 2014-01-07 MED ORDER — HEPARIN SOD (PORK) LOCK FLUSH 100 UNIT/ML IV SOLN
INTRAVENOUS | Status: AC
  Filled 2014-01-07: qty 5

## 2014-01-07 NOTE — Progress Notes (Signed)
Portacath flushed using aseptic technique.  Small amount of blood return noted.  Flushed easily with no discomfort.  Pt. Denies any pain or discomfort.  Pt. Scheduled for next port flush in 6 weeks.

## 2014-01-08 ENCOUNTER — Encounter: Payer: BC Managed Care – PPO | Admitting: Physical Therapy

## 2014-01-12 NOTE — Telephone Encounter (Signed)
Please see Visit Info comments 

## 2014-01-13 ENCOUNTER — Ambulatory Visit: Payer: BC Managed Care – PPO | Admitting: Physical Therapy

## 2014-01-13 DIAGNOSIS — IMO0001 Reserved for inherently not codable concepts without codable children: Secondary | ICD-10-CM | POA: Diagnosis not present

## 2014-01-15 ENCOUNTER — Ambulatory Visit: Payer: BC Managed Care – PPO | Admitting: Physical Therapy

## 2014-01-15 DIAGNOSIS — IMO0001 Reserved for inherently not codable concepts without codable children: Secondary | ICD-10-CM | POA: Diagnosis not present

## 2014-01-20 ENCOUNTER — Ambulatory Visit: Payer: BC Managed Care – PPO | Admitting: Physical Therapy

## 2014-01-20 DIAGNOSIS — IMO0001 Reserved for inherently not codable concepts without codable children: Secondary | ICD-10-CM | POA: Diagnosis not present

## 2014-01-22 ENCOUNTER — Telehealth: Payer: Self-pay | Admitting: Oncology

## 2014-01-22 NOTE — Telephone Encounter (Signed)
KK out pt to see BS 5/29 - d/t per BS/LT. s/w pt she is aware. pt was able to speak [place].

## 2014-01-30 ENCOUNTER — Ambulatory Visit: Payer: BC Managed Care – PPO | Admitting: Oncology

## 2014-01-30 ENCOUNTER — Other Ambulatory Visit: Payer: BC Managed Care – PPO

## 2014-02-05 ENCOUNTER — Telehealth: Payer: Self-pay | Admitting: Oncology

## 2014-02-05 NOTE — Telephone Encounter (Signed)
m °

## 2014-02-05 NOTE — Telephone Encounter (Signed)
, °

## 2014-02-09 ENCOUNTER — Other Ambulatory Visit (HOSPITAL_BASED_OUTPATIENT_CLINIC_OR_DEPARTMENT_OTHER): Payer: BC Managed Care – PPO

## 2014-02-09 DIAGNOSIS — C20 Malignant neoplasm of rectum: Secondary | ICD-10-CM

## 2014-02-09 LAB — COMPREHENSIVE METABOLIC PANEL (CC13)
ALK PHOS: 66 U/L (ref 40–150)
ALT: 16 U/L (ref 0–55)
ANION GAP: 9 meq/L (ref 3–11)
AST: 18 U/L (ref 5–34)
Albumin: 3.5 g/dL (ref 3.5–5.0)
BILIRUBIN TOTAL: 0.81 mg/dL (ref 0.20–1.20)
BUN: 10.3 mg/dL (ref 7.0–26.0)
CO2: 21 mEq/L — ABNORMAL LOW (ref 22–29)
CREATININE: 0.7 mg/dL (ref 0.6–1.1)
Calcium: 8.8 mg/dL (ref 8.4–10.4)
Chloride: 112 mEq/L — ABNORMAL HIGH (ref 98–109)
Glucose: 87 mg/dl (ref 70–140)
Potassium: 3.8 mEq/L (ref 3.5–5.1)
Sodium: 142 mEq/L (ref 136–145)
TOTAL PROTEIN: 5.7 g/dL — AB (ref 6.4–8.3)

## 2014-02-09 LAB — CBC WITH DIFFERENTIAL/PLATELET
BASO%: 0.6 % (ref 0.0–2.0)
Basophils Absolute: 0 10*3/uL (ref 0.0–0.1)
EOS%: 2.3 % (ref 0.0–7.0)
Eosinophils Absolute: 0.1 10*3/uL (ref 0.0–0.5)
HEMATOCRIT: 31.3 % — AB (ref 34.8–46.6)
HGB: 10.2 g/dL — ABNORMAL LOW (ref 11.6–15.9)
LYMPH%: 18.3 % (ref 14.0–49.7)
MCH: 28.8 pg (ref 25.1–34.0)
MCHC: 32.7 g/dL (ref 31.5–36.0)
MCV: 88.1 fL (ref 79.5–101.0)
MONO#: 0.5 10*3/uL (ref 0.1–0.9)
MONO%: 11.4 % (ref 0.0–14.0)
NEUT#: 3 10*3/uL (ref 1.5–6.5)
NEUT%: 67.4 % (ref 38.4–76.8)
PLATELETS: 215 10*3/uL (ref 145–400)
RBC: 3.55 10*6/uL — ABNORMAL LOW (ref 3.70–5.45)
RDW: 15 % — ABNORMAL HIGH (ref 11.2–14.5)
WBC: 4.5 10*3/uL (ref 3.9–10.3)
lymph#: 0.8 10*3/uL — ABNORMAL LOW (ref 0.9–3.3)

## 2014-02-10 LAB — CEA: CEA: <0.5 ng/mL (ref 0.0–5.0)

## 2014-02-18 ENCOUNTER — Encounter (HOSPITAL_COMMUNITY): Admission: RE | Admit: 2014-02-18 | Payer: BC Managed Care – PPO | Source: Ambulatory Visit

## 2014-02-19 ENCOUNTER — Other Ambulatory Visit: Payer: BC Managed Care – PPO

## 2014-02-20 ENCOUNTER — Ambulatory Visit: Payer: BC Managed Care – PPO | Admitting: Oncology

## 2014-03-02 ENCOUNTER — Encounter (HOSPITAL_COMMUNITY)
Admission: RE | Admit: 2014-03-02 | Discharge: 2014-03-02 | Disposition: A | Discharge status: Home / Self Care | Payer: BC Managed Care – PPO | Source: Ambulatory Visit | Attending: Gastroenterology | Admitting: Gastroenterology

## 2014-03-02 DIAGNOSIS — Z452 Encounter for adjustment and management of vascular access device: Secondary | ICD-10-CM | POA: Insufficient documentation

## 2014-03-02 DIAGNOSIS — C20 Malignant neoplasm of rectum: Secondary | ICD-10-CM | POA: Insufficient documentation

## 2014-03-02 MED ORDER — SODIUM CHLORIDE 0.9 % IJ SOLN: 10.0000 mL | INTRAMUSCULAR | Status: DC | PRN

## 2014-03-02 MED ORDER — HEPARIN SOD (PORK) LOCK FLUSH 100 UNIT/ML IV SOLN: 500.0000 [IU] | INTRAVENOUS | Status: DC | PRN

## 2014-03-02 MED ORDER — HEPARIN SOD (PORK) LOCK FLUSH 100 UNIT/ML IV SOLN
INTRAVENOUS | Status: AC
Start: 2014-03-02 — End: 2014-03-02
  Filled 2014-03-02: qty 5

## 2014-03-02 MED ORDER — SODIUM CHLORIDE 0.9 % IJ SOLN
INTRAMUSCULAR | Status: AC
  Filled 2014-03-02: qty 10

## 2014-03-03 ENCOUNTER — Telehealth: Payer: Self-pay

## 2014-03-03 MED ORDER — OPIUM TINCTURE (PAREGORIC) 2 MG/5ML PO TINC: ORAL | Status: DC

## 2014-03-03 NOTE — Telephone Encounter (Signed)
Completed.

## 2014-03-03 NOTE — Telephone Encounter (Signed)
Pt's husband came by the office and said [facility] has sent request in for a refill on the paregoric opium tincture. We have not seen it.  I told him I will send Dr. Oneida Alar a request for the refill and call and let him know.

## 2014-03-03 NOTE — Telephone Encounter (Signed)
REVIEWED.  

## 2014-03-03 NOTE — Telephone Encounter (Signed)
LM at home with son to tell father the prescription is ready for pick up.

## 2014-03-05 ENCOUNTER — Telehealth: Payer: Self-pay | Admitting: *Deleted

## 2014-03-05 ENCOUNTER — Telehealth: Payer: Self-pay | Admitting: Oncology

## 2014-03-05 ENCOUNTER — Ambulatory Visit (HOSPITAL_BASED_OUTPATIENT_CLINIC_OR_DEPARTMENT_OTHER): Payer: Medicare Other | Admitting: Oncology

## 2014-03-05 ENCOUNTER — Ambulatory Visit: Payer: BC Managed Care – PPO | Admitting: Oncology

## 2014-03-05 ENCOUNTER — Other Ambulatory Visit: Payer: BC Managed Care – PPO

## 2014-03-05 ENCOUNTER — Ambulatory Visit (HOSPITAL_BASED_OUTPATIENT_CLINIC_OR_DEPARTMENT_OTHER): Payer: BC Managed Care – PPO

## 2014-03-05 VITALS — BP 111/76 | HR 71 | Temp 97.1°F | Resp 18 | Ht <= 58 in | Wt 93.1 lb

## 2014-03-05 DIAGNOSIS — C20 Malignant neoplasm of rectum: Secondary | ICD-10-CM

## 2014-03-05 DIAGNOSIS — D649 Anemia, unspecified: Secondary | ICD-10-CM

## 2014-03-05 DIAGNOSIS — E46 Unspecified protein-calorie malnutrition: Secondary | ICD-10-CM

## 2014-03-05 LAB — CBC WITH DIFFERENTIAL/PLATELET
BASO%: 0.1 % (ref 0.0–2.0)
Basophils Absolute: 0 10*3/uL (ref 0.0–0.1)
EOS%: 0 % (ref 0.0–7.0)
Eosinophils Absolute: 0 10*3/uL (ref 0.0–0.5)
HEMATOCRIT: 36.2 % (ref 34.8–46.6)
HGB: 11.8 g/dL (ref 11.6–15.9)
LYMPH#: 0.8 10*3/uL — AB (ref 0.9–3.3)
LYMPH%: 6.2 % — ABNORMAL LOW (ref 14.0–49.7)
MCH: 29.1 pg (ref 25.1–34.0)
MCHC: 32.7 g/dL (ref 31.5–36.0)
MCV: 89.1 fL (ref 79.5–101.0)
MONO#: 0.9 10*3/uL (ref 0.1–0.9)
MONO%: 7.7 % (ref 0.0–14.0)
NEUT#: 10.5 10*3/uL — ABNORMAL HIGH (ref 1.5–6.5)
NEUT%: 86 % — ABNORMAL HIGH (ref 38.4–76.8)
Platelets: 269 10*3/uL (ref 145–400)
RBC: 4.06 10*6/uL (ref 3.70–5.45)
RDW: 15.8 % — AB (ref 11.2–14.5)
WBC: 12.3 10*3/uL — AB (ref 3.9–10.3)

## 2014-03-05 LAB — CHCC SMEAR

## 2014-03-05 LAB — FERRITIN CHCC: Ferritin: 46 ng/ml (ref 9–269)

## 2014-03-05 NOTE — Telephone Encounter (Signed)
Called husband with CBC results-look good. Will call when ferritin result is returned. He inquired about her K+ level. Made him aware it was normal in May and MD did not check it today.
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
## 2014-03-05 NOTE — Telephone Encounter (Signed)
Gave pt appt for lab and Md on december , porta cath removal this month

## 2014-03-06 ENCOUNTER — Ambulatory Visit: Payer: BC Managed Care – PPO | Admitting: Oncology

## 2014-03-07 ENCOUNTER — Other Ambulatory Visit (HOSPITAL_COMMUNITY): Payer: Self-pay | Admitting: General Surgery

## 2014-03-09 ENCOUNTER — Encounter (HOSPITAL_COMMUNITY)
Admission: RE | Admit: 2014-03-09 | Discharge: 2014-03-09 | Disposition: A | Discharge status: Home / Self Care | Payer: BC Managed Care – PPO | Source: Ambulatory Visit | Attending: Gastroenterology | Admitting: Gastroenterology

## 2014-03-09 ENCOUNTER — Other Ambulatory Visit (INDEPENDENT_AMBULATORY_CARE_PROVIDER_SITE_OTHER): Payer: Self-pay

## 2014-03-09 MED ORDER — LOPERAMIDE HCL 2 MG PO CAPS: ORAL_CAPSULE | ORAL | Status: DC

## 2014-03-11 ENCOUNTER — Ambulatory Visit (INDEPENDENT_AMBULATORY_CARE_PROVIDER_SITE_OTHER): Payer: Medicare Other | Admitting: Gastroenterology

## 2014-03-11 ENCOUNTER — Encounter: Payer: Self-pay | Admitting: Gastroenterology

## 2014-03-11 VITALS — BP 99/68 | HR 74 | Temp 97.4°F | Resp 16 | Ht <= 58 in | Wt 91.6 lb

## 2014-03-11 DIAGNOSIS — E43 Unspecified severe protein-calorie malnutrition: Secondary | ICD-10-CM

## 2014-03-11 DIAGNOSIS — C20 Malignant neoplasm of rectum: Secondary | ICD-10-CM

## 2014-03-11 MED ORDER — OPIUM TINCTURE (PAREGORIC) 2 MG/5ML PO TINC: ORAL | Status: DC

## 2014-03-11 NOTE — Patient Instructions (Addendum)
USE PAREGORIC AND IMODIUM TO CONTROL LOOSE STOOLS.  USE OMEPRAZOLE IF NEEDED FOR HEARTBURN OR INDIGESTION.  FOLLOW UP IN 6 MOS.

## 2014-03-11 NOTE — Progress Notes (Signed)
Reminder in epic °

## 2014-03-11 NOTE — Assessment & Plan Note (Signed)
APPETITE GOOD. BOWEL BETTER.  CONTINUE TO MONITOR SYMPTOMS. USE IMODIUM AND PAREGORIC TO CONTROL LOOSE STOOLS. OPV IN 6 MOS

## 2014-03-11 NOTE — Assessment & Plan Note (Signed)
CONTINUE TO MONITOR SYMPTOMS.

## 2014-03-11 NOTE — Progress Notes (Signed)
Subjective:    Patient ID: Renee Nicholson, female    DOB: April 18, 1966, 48 y.o.   MRN: [id number]  HPI  BMs:4-5-SOFT/PASTY-ll together. No nocturnal stools. A LITTLE BURNING AND EXERCISING HAS HELPED. CREAM USED AFTER BMs. GONNA PULL THE PORT ON JUN 29. TAKING PROBIOTIC DAILY. NOT HAVING HEARTBURN. USING OMEPRAZOLE/MVI/B12 DAILY. STILL USING PAREGORIC. TAKING IMODIUM ONCE DAILY. USING MEGACE AS NEEDED. WANTS TO KNOW WHEN HER KIDS SHOULD HAVE TCS.  Past Medical History  Diagnosis Date  . Anemia   . Diarrhea   . Bright red rectal bleeding 09/13/2011  . History of chemotherapy     completed 09/2011   . Hx of radiation therapy 08/29/11 to 10/09/11    rectum  . Ileostomy in place   . Cancer   . Rectal cancer   . Hx of tracheostomy     12/2012   Past Surgical History  Procedure Laterality Date  . Tubal ligation    . Carpal tunnel release    . Colonoscopy  07/26/2011    Procedure: COLONOSCOPY;  Surgeon: Dorothyann Peng, MD;  [facility];  Service: Endoscopy;  Laterality: N/A;  10:40  . Esophagogastroduodenoscopy  07/26/2011    Procedure: ESOPHAGOGASTRODUODENOSCOPY (EGD);  Surgeon: Dorothyann Peng, MD;  [facility];  Service: Endoscopy;  Laterality: N/A;  . Esophagogastroduodenoscopy  12/27/2011    Procedure: ESOPHAGOGASTRODUODENOSCOPY (EGD);  Surgeon: Lear Ng, MD;  Location: [facility];  Service: Endoscopy;  Laterality: N/A;  . Laparoscopic colon resection  12/14/11    diverting ileostomy  . Portacath placement  02/21/2012    Procedure: INSERTION PORT-A-CATH;  Surgeon: Stark Klein, MD;  Location: [place];  Service: General;  Laterality: N/A;  . Evaluation under anesthesia with anal fissurotomy N/A 11/06/2012    Procedure: Exam Under Anesthesia , Anal Dilation;  Surgeon: Stark Klein, MD;  [facility];  Service: General;  Laterality: N/A;  Exam Under Anesthesia , Anal Dilation  . Laparotomy N/A 01/03/2013    Procedure: EXPLORATORY LAPAROTOMY WITH LYSIS OF  ADHESIONS, revision of ileostomy;  Surgeon: Stark Klein, MD;  Location: [place];  Service: General;  Laterality: N/A;  . Ileostomy closure N/A 04/24/2013    Procedure: ILEOSTOMY TAKEDOWN;  Surgeon: Stark Klein, MD;  [facility];  Service: General;  Laterality: N/A;  . I&d extremity Left 05/31/2013    Procedure: IRRIGATION AND DEBRIDEMENT EXTREMITY;  Surgeon: Linna Hoff, MD;  Location: [place];  Service: Orthopedics;  Laterality: Left;  . Open reduction internal fixation (orif) metacarpal Left 05/31/2013    Procedure: OPEN REDUCTION INTERNAL FIXATION (ORIF) METACARPAL;  Surgeon: Linna Hoff, MD;  Location: [place];  Service: Orthopedics;  Laterality: Left;   No Known Allergies  Current Outpatient Prescriptions  Medication Sig Dispense Refill  . acidophilus (RISAQUAD) CAPS capsule Take by mouth daily.      . diphenoxylate-atropine (LOMOTIL) 2.5-0.025 MG per tablet Take 2 tablets by mouth 4 (four) times daily.  240 tablet  5  . hydrocortisone cream-nystatin cream-zinc oxide TOP TO AFFECTED AREA TID  1 Tube  11  . loperamide (IMODIUM) 2 MG capsule Two capsules QID for diarrhea.  240 capsule  11  . megestrol (MEGACE) 40 MG/ML suspension TAKE TWO TEASPOONSFUL DAILY  240 mL  1  . omeprazole (PRILOSEC) 20 MG capsule Take 20 mg by mouth daily.      Marland Kitchen Opium Tincture, Paregoric, 2 MG/5ML TINC Take 2.30ml by mouth 30 minutes prior to lunch and supper and 4 ml  at bedtime.  400 mL  0  . OVER THE COUNTER MEDICATION Take 1 drop by mouth daily as needed (gas).      . B Complex-C (B-COMPLEX WITH VITAMIN C) tablet Take 1 tablet by mouth daily.  30 tablet  1  . Multiple Vitamin (MULTIVITAMIN WITH MINERALS) TABS Take 1 tablet by mouth daily.          Review of Systems     Objective:   Physical Exam  Vitals reviewed. Constitutional: She is oriented to person, place, and time. She appears well-developed and well-nourished. No distress.  HENT:  Head: Normocephalic and atraumatic.  Mouth/Throat:  Oropharynx is clear and moist. No oropharyngeal exudate.  Eyes: Pupils are equal, round, and reactive to light. No scleral icterus.  Neck: Normal range of motion. Neck supple.  Cardiovascular: Normal rate, regular rhythm and normal heart sounds.   Pulmonary/Chest: Effort normal and breath sounds normal. No respiratory distress.  Abdominal: Soft. Bowel sounds are normal. She exhibits no distension. There is no tenderness.  Musculoskeletal: She exhibits no edema.  Lymphadenopathy:    She has no cervical adenopathy.  Neurological: She is alert and oriented to person, place, and time.  Psychiatric: She has a normal mood and affect.          Assessment & Plan:

## 2014-03-23 ENCOUNTER — Encounter (INDEPENDENT_AMBULATORY_CARE_PROVIDER_SITE_OTHER): Payer: BC Managed Care – PPO | Admitting: General Surgery

## 2014-03-25 ENCOUNTER — Encounter (INDEPENDENT_AMBULATORY_CARE_PROVIDER_SITE_OTHER): Payer: Self-pay | Admitting: General Surgery

## 2014-03-25 ENCOUNTER — Ambulatory Visit (INDEPENDENT_AMBULATORY_CARE_PROVIDER_SITE_OTHER): Payer: Medicare Other | Admitting: General Surgery

## 2014-03-25 VITALS — BP 100/60 | HR 61 | Temp 98.0°F | Resp 14 | Ht <= 58 in | Wt 93.2 lb

## 2014-03-25 DIAGNOSIS — C20 Malignant neoplasm of rectum: Secondary | ICD-10-CM

## 2014-03-25 NOTE — Patient Instructions (Signed)
We will get CT abd/pelvis with contrast.  This will be the last scan unless you have change in symptoms.    Follow up with Dr. Benay Spice in December 2015.  Follow up with me in 1 year.  Continue cream around anus.

## 2014-03-25 NOTE — Assessment & Plan Note (Signed)
Pt has no evidence of disease.   Follow up in 1 year for cancer  Will schedule to remove port a cath.

## 2014-03-25 NOTE — Progress Notes (Signed)
HISTORY: Patient is a 48 year old female with a history of rectal cancer whose history is quite complex.  She has had a total abdominal colectomy with ileal pouch anal anastomosis and is status post ileostomy takedown.  She is continuing with the Gerhardt's butt cream for the perianal irritation.  She is still having 5-6 stools per day with paregoric and Lomotil. She has lost a few pounds recently since her children have been home from college.    PERTINENT REVIEW OF SYSTEMS: Otherwise negative x 11.    Filed Vitals:   03/25/14 1150  BP: 100/60  Pulse: 61  Temp: 98 F (36.7 C)  Resp: 14   Filed Weights   03/25/14 1150  Weight: 93 lb 3.2 oz (42.275 kg)     EXAM: Head: Normocephalic and atraumatic.  Eyes:  Conjunctivae are normal. Pupils are equal, round, and reactive to light. No scleral icterus.   Resp: No respiratory distress, normal effort. Abd:  Abdomen is soft, non distended and non tender. No masses are palpable.  There is no rebound and no guarding. tips of sutures in ostomy site are trimmed.  No evidence of hernia Rectal:  Two anterior skin tags that do not seem tender with palpation.  Significant discomfort with digital exam.  No masses palpable. Neurological: Alert and oriented to person, place, and time. Coordination normal.  Skin: Skin is warm and dry. No rash noted. No diaphoretic. No erythema. No pallor.  Psychiatric: Normal mood and affect. Normal behavior. Judgment and thought content normal.      ASSESSMENT AND PLAN:   Rectal cancer s/p TAC, IPAA, loop ileostomy on 12/14/11, ostomy revision 12/2012, ostomy takedown 04/24/2013 Pt has no evidence of disease.   Follow up in 1 year for cancer  Will schedule to remove port a cath.         Milus Height, MD Surgical Oncology, [facility]  [name]., MD [name]., MD

## 2014-04-02 ENCOUNTER — Ambulatory Visit
Admission: RE | Admit: 2014-04-02 | Discharge: 2014-04-02 | Disposition: A | Discharge status: Home / Self Care | Payer: Medicare Other | Source: Ambulatory Visit | Attending: General Surgery | Admitting: General Surgery

## 2014-04-02 DIAGNOSIS — C20 Malignant neoplasm of rectum: Secondary | ICD-10-CM

## 2014-04-02 MED ORDER — IOHEXOL 300 MG/ML  SOLN
80.0000 mL | Freq: Once | INTRAMUSCULAR | Status: AC | PRN
  Administered 2014-04-02: 80 mL via INTRAVENOUS

## 2014-04-21 ENCOUNTER — Telehealth (INDEPENDENT_AMBULATORY_CARE_PROVIDER_SITE_OTHER): Payer: Self-pay

## 2014-04-21 NOTE — Telephone Encounter (Signed)
Pt made aware that her CT scan showed no sign of cancer.

## 2014-04-22 ENCOUNTER — Encounter (HOSPITAL_BASED_OUTPATIENT_CLINIC_OR_DEPARTMENT_OTHER): Payer: Self-pay | Admitting: *Deleted

## 2014-04-22 NOTE — Progress Notes (Signed)
Pt speaks alittle english-will get interpreter-husband will be here-no labs needed

## 2014-04-29 ENCOUNTER — Encounter (HOSPITAL_BASED_OUTPATIENT_CLINIC_OR_DEPARTMENT_OTHER)
Admission: RE | Disposition: A | Discharge status: Home / Self Care | Payer: Self-pay | Source: Ambulatory Visit | Attending: General Surgery

## 2014-04-29 ENCOUNTER — Ambulatory Visit (HOSPITAL_BASED_OUTPATIENT_CLINIC_OR_DEPARTMENT_OTHER): Payer: Medicare Other | Admitting: Anesthesiology

## 2014-04-29 ENCOUNTER — Encounter (HOSPITAL_BASED_OUTPATIENT_CLINIC_OR_DEPARTMENT_OTHER): Payer: Medicare Other | Admitting: Anesthesiology

## 2014-04-29 ENCOUNTER — Encounter (HOSPITAL_BASED_OUTPATIENT_CLINIC_OR_DEPARTMENT_OTHER): Payer: Self-pay | Admitting: *Deleted

## 2014-04-29 ENCOUNTER — Ambulatory Visit (HOSPITAL_BASED_OUTPATIENT_CLINIC_OR_DEPARTMENT_OTHER)
Admission: RE | Admit: 2014-04-29 | Discharge: 2014-04-29 | Disposition: A | Discharge status: Home / Self Care | Payer: Medicare Other | Source: Ambulatory Visit | Attending: General Surgery | Admitting: General Surgery

## 2014-04-29 DIAGNOSIS — Z452 Encounter for adjustment and management of vascular access device: Secondary | ICD-10-CM | POA: Diagnosis not present

## 2014-04-29 DIAGNOSIS — Z923 Personal history of irradiation: Secondary | ICD-10-CM | POA: Diagnosis not present

## 2014-04-29 DIAGNOSIS — Z932 Ileostomy status: Secondary | ICD-10-CM | POA: Diagnosis not present

## 2014-04-29 DIAGNOSIS — Z9221 Personal history of antineoplastic chemotherapy: Secondary | ICD-10-CM | POA: Diagnosis not present

## 2014-04-29 DIAGNOSIS — C20 Malignant neoplasm of rectum: Secondary | ICD-10-CM | POA: Diagnosis not present

## 2014-04-29 HISTORY — PX: PORT-A-CATH REMOVAL: SHX5289

## 2014-04-29 LAB — POCT HEMOGLOBIN-HEMACUE: Hemoglobin: 13.1 g/dL (ref 12.0–15.0)

## 2014-04-29 SURGERY — REMOVAL PORT-A-CATH
Anesthesia: Monitor Anesthesia Care | Site: Chest | Laterality: Left

## 2014-04-29 MED ORDER — MIDAZOLAM HCL 5 MG/5ML IJ SOLN
INTRAMUSCULAR | Status: DC | PRN
  Administered 2014-04-29: 1 mg via INTRAVENOUS

## 2014-04-29 MED ORDER — OXYCODONE HCL 5 MG/5ML PO SOLN: 5.0000 mg | Freq: Once | ORAL | Status: DC | PRN

## 2014-04-29 MED ORDER — LACTATED RINGERS IV SOLN
INTRAVENOUS | Status: DC
Start: 2014-04-29 — End: 2014-04-29
  Administered 2014-04-29: 08:00:00 via INTRAVENOUS

## 2014-04-29 MED ORDER — LIDOCAINE-EPINEPHRINE (PF) 1 %-1:200000 IJ SOLN
INTRAMUSCULAR | Status: DC | PRN
  Administered 2014-04-29: 09:00:00

## 2014-04-29 MED ORDER — PROPOFOL 10 MG/ML IV BOLUS
INTRAVENOUS | Status: AC
  Filled 2014-04-29: qty 20

## 2014-04-29 MED ORDER — FENTANYL CITRATE 0.05 MG/ML IJ SOLN
INTRAMUSCULAR | Status: DC | PRN
  Administered 2014-04-29: 50 ug via INTRAVENOUS
  Administered 2014-04-29: 25 ug via INTRAVENOUS

## 2014-04-29 MED ORDER — FENTANYL CITRATE 0.05 MG/ML IJ SOLN: 50.0000 ug | INTRAMUSCULAR | Status: DC | PRN

## 2014-04-29 MED ORDER — MIDAZOLAM HCL 2 MG/2ML IJ SOLN: 1.0000 mg | INTRAMUSCULAR | Status: DC | PRN

## 2014-04-29 MED ORDER — CEFAZOLIN SODIUM-DEXTROSE 2-3 GM-% IV SOLR
INTRAVENOUS | Status: AC
  Filled 2014-04-29: qty 50

## 2014-04-29 MED ORDER — CEFAZOLIN SODIUM-DEXTROSE 2-3 GM-% IV SOLR
2.0000 g | INTRAVENOUS | Status: AC
  Administered 2014-04-29: 2 g via INTRAVENOUS

## 2014-04-29 MED ORDER — BUPIVACAINE HCL (PF) 0.25 % IJ SOLN
INTRAMUSCULAR | Status: AC
  Filled 2014-04-29: qty 30

## 2014-04-29 MED ORDER — ONDANSETRON HCL 4 MG/2ML IJ SOLN: 4.0000 mg | Freq: Four times a day (QID) | INTRAMUSCULAR | Status: DC | PRN

## 2014-04-29 MED ORDER — FENTANYL CITRATE 0.05 MG/ML IJ SOLN
INTRAMUSCULAR | Status: AC
  Filled 2014-04-29: qty 2

## 2014-04-29 MED ORDER — ONDANSETRON HCL 4 MG/2ML IJ SOLN
INTRAMUSCULAR | Status: DC | PRN
  Administered 2014-04-29: 4 mg via INTRAVENOUS

## 2014-04-29 MED ORDER — OXYCODONE HCL 5 MG PO TABS
5.0000 mg | ORAL_TABLET | Freq: Once | ORAL | Status: DC | PRN
Start: 2014-04-29 — End: 2014-04-29

## 2014-04-29 MED ORDER — LIDOCAINE-EPINEPHRINE (PF) 1 %-1:200000 IJ SOLN
INTRAMUSCULAR | Status: AC
  Filled 2014-04-29: qty 10

## 2014-04-29 MED ORDER — MIDAZOLAM HCL 2 MG/2ML IJ SOLN
INTRAMUSCULAR | Status: AC
  Filled 2014-04-29: qty 2

## 2014-04-29 MED ORDER — OXYCODONE HCL 5 MG PO TABS: 5.0000 mg | ORAL_TABLET | Freq: Four times a day (QID) | ORAL | Status: DC | PRN

## 2014-04-29 MED ORDER — PROPOFOL INFUSION 10 MG/ML OPTIME
INTRAVENOUS | Status: DC | PRN
  Administered 2014-04-29: 50 ug/kg/min via INTRAVENOUS

## 2014-04-29 MED ORDER — FENTANYL CITRATE 0.05 MG/ML IJ SOLN: 25.0000 ug | INTRAMUSCULAR | Status: DC | PRN

## 2014-04-29 SURGICAL SUPPLY — 36 items
BLADE HEX COATED 2.75 (ELECTRODE) ×3 IMPLANT
BLADE SURG 15 STRL LF DISP TIS (BLADE) ×1 IMPLANT
BLADE SURG 15 STRL SS (BLADE) ×2
CANISTER SUCT 1200ML W/VALVE (MISCELLANEOUS) IMPLANT
CHLORAPREP W/TINT 26ML (MISCELLANEOUS) ×3 IMPLANT
COVER MAYO STAND STRL (DRAPES) ×3 IMPLANT
COVER TABLE BACK 60X90 (DRAPES) ×3 IMPLANT
DECANTER SPIKE VIAL GLASS SM (MISCELLANEOUS) IMPLANT
DERMABOND ADVANCED (GAUZE/BANDAGES/DRESSINGS) ×2
DERMABOND ADVANCED .7 DNX12 (GAUZE/BANDAGES/DRESSINGS) ×1 IMPLANT
DRAPE PED LAPAROTOMY (DRAPES) ×3 IMPLANT
DRAPE UTILITY XL STRL (DRAPES) ×3 IMPLANT
ELECT REM PT RETURN 9FT ADLT (ELECTROSURGICAL) ×3
ELECTRODE REM PT RTRN 9FT ADLT (ELECTROSURGICAL) ×1 IMPLANT
GLOVE BIO SURGEON STRL SZ 6 (GLOVE) ×3 IMPLANT
GLOVE BIOGEL PI IND STRL 6.5 (GLOVE) ×1 IMPLANT
GLOVE BIOGEL PI IND STRL 7.0 (GLOVE) ×1 IMPLANT
GLOVE BIOGEL PI INDICATOR 6.5 (GLOVE) ×2
GLOVE BIOGEL PI INDICATOR 7.0 (GLOVE) ×2
GLOVE ECLIPSE 6.5 STRL STRAW (GLOVE) ×3 IMPLANT
GOWN STRL REUS W/ TWL LRG LVL3 (GOWN DISPOSABLE) ×1 IMPLANT
GOWN STRL REUS W/TWL 2XL LVL3 (GOWN DISPOSABLE) ×3 IMPLANT
GOWN STRL REUS W/TWL LRG LVL3 (GOWN DISPOSABLE) ×2
NEEDLE HYPO 25X1 1.5 SAFETY (NEEDLE) ×3 IMPLANT
NS IRRIG 1000ML POUR BTL (IV SOLUTION) IMPLANT
PACK BASIN DAY SURGERY FS (CUSTOM PROCEDURE TRAY) ×3 IMPLANT
PENCIL BUTTON HOLSTER BLD 10FT (ELECTRODE) ×3 IMPLANT
SUT MNCRL AB 4-0 PS2 18 (SUTURE) ×3 IMPLANT
SUT VIC AB 3-0 SH 27 (SUTURE) ×2
SUT VIC AB 3-0 SH 27X BRD (SUTURE) ×1 IMPLANT
SYR CONTROL 10ML LL (SYRINGE) ×3 IMPLANT
TOWEL OR 17X24 6PK STRL BLUE (TOWEL DISPOSABLE) ×3 IMPLANT
TOWEL OR NON WOVEN STRL DISP B (DISPOSABLE) IMPLANT
TUBE CONNECTING 20'X1/4 (TUBING)
TUBE CONNECTING 20X1/4 (TUBING) IMPLANT
YANKAUER SUCT BULB TIP NO VENT (SUCTIONS) IMPLANT

## 2014-04-29 NOTE — H&P (Signed)
Renee Nicholson is an 48 y.o. female.   Chief Complaint: rectal cancer (history of) HPI:  Pt is s/p TAC with IPAA and ileostomy. Had issues with repeat SBOs and ICU admit.  Now done with all treatment and ostomy down.  Maintaining weight.    Past Medical History  Diagnosis Date  . Anemia   . Diarrhea   . Bright red rectal bleeding 09/13/2011  . History of chemotherapy     completed 09/2011   . Hx of radiation therapy 08/29/11 to 10/09/11    rectum  . Ileostomy in place   . Cancer   . Rectal cancer   . Hx of tracheostomy     12/2012    Past Surgical History  Procedure Laterality Date  . Tubal ligation    . Carpal tunnel release    . Colonoscopy  07/26/2011    Procedure: COLONOSCOPY;  Surgeon: Dorothyann Peng, MD;  Location: [facility];  Service: Endoscopy;  Laterality: N/A;  10:40  . Esophagogastroduodenoscopy  07/26/2011    Procedure: ESOPHAGOGASTRODUODENOSCOPY (EGD);  Surgeon: Dorothyann Peng, MD;  [facility];  Service: Endoscopy;  Laterality: N/A;  . Esophagogastroduodenoscopy  12/27/2011    Procedure: ESOPHAGOGASTRODUODENOSCOPY (EGD);  Surgeon: Lear Ng, MD;  Location: [facility];  Service: Endoscopy;  Laterality: N/A;  . Laparoscopic colon resection  12/14/11    diverting ileostomy  . Portacath placement  02/21/2012    Procedure: INSERTION PORT-A-CATH;  Surgeon: Stark Klein, MD;  Location: [place];  Service: General;  Laterality: N/A;  . Evaluation under anesthesia with anal fissurotomy N/A 11/06/2012    Procedure: Exam Under Anesthesia , Anal Dilation;  Surgeon: Stark Klein, MD;  Location: [facility];  Service: General;  Laterality: N/A;  Exam Under Anesthesia , Anal Dilation  . Laparotomy N/A 01/03/2013    Procedure: EXPLORATORY LAPAROTOMY WITH LYSIS OF ADHESIONS, revision of ileostomy;  Surgeon: Stark Klein, MD;  Location: [place];  Service: General;  Laterality: N/A;  . Ileostomy closure N/A 04/24/2013    Procedure: ILEOSTOMY TAKEDOWN;  Surgeon: Stark Klein, MD;  Location: [facility];  Service: General;  Laterality: N/A;  . I&d extremity Left 05/31/2013    Procedure: IRRIGATION AND DEBRIDEMENT EXTREMITY;  Surgeon: Linna Hoff, MD;  Location: [place];  Service: Orthopedics;  Laterality: Left;  . Open reduction internal fixation (orif) metacarpal Left 05/31/2013    Procedure: OPEN REDUCTION INTERNAL FIXATION (ORIF) METACARPAL;  Surgeon: Linna Hoff, MD;  Location: [place];  Service: Orthopedics;  Laterality: Left;    Family History  Problem Relation Age of Onset  . Diabetes Mother   . Colon cancer Neg Hx   . Cancer Brother 44    rectal cancer lives in [place]   Social History:  reports that she has never smoked. She has never used smokeless tobacco. She reports that she does not drink alcohol or use illicit drugs.  Allergies: No Known Allergies  Medications Prior to Admission  Medication Sig Dispense Refill  . acidophilus (RISAQUAD) CAPS capsule Take by mouth daily.      . B Complex-C (B-COMPLEX WITH VITAMIN C) tablet Take 1 tablet by mouth daily.  30 tablet  1  . diphenoxylate-atropine (LOMOTIL) 2.5-0.025 MG per tablet Take 2 tablets by mouth 4 (four) times daily.  240 tablet  5  . hydrocortisone cream-nystatin cream-zinc oxide TOP TO AFFECTED AREA TID  1 Tube  11  . loperamide (IMODIUM) 2 MG capsule Two capsules QID for diarrhea.  240 capsule  11  . megestrol (MEGACE) 40 MG/ML suspension TAKE TWO TEASPOONSFUL DAILY  240 mL  1  . Multiple Vitamin (MULTIVITAMIN WITH MINERALS) TABS Take 1 tablet by mouth daily.      Marland Kitchen omeprazole (PRILOSEC) 20 MG capsule Take 20 mg by mouth daily.      Marland Kitchen Opium Tincture, Paregoric, 2 MG/5ML TINC Take 2.40ml by mouth 30 minutes prior to lunch and supper and 4 ml at bedtime.  400 mL  0  . OVER THE COUNTER MEDICATION Take 1 drop by mouth daily as needed (gas).        Results for orders placed during the hospital encounter of 04/29/14 (from the past 48 hour(s))  POCT HEMOGLOBIN-HEMACUE     Status: None    Collection Time    04/29/14  7:46 AM      Result Value Ref Range   Hemoglobin 13.1  12.0 - 15.0 g/dL   No results found.  Review of Systems  Gastrointestinal:       Bloating.  All other systems reviewed and are negative.   Blood pressure 105/71, pulse 65, temperature 97.9 F (36.6 C), temperature source Oral, resp. rate 20, height 4\' 9"  (1.448 m), weight 91 lb (41.277 kg), last menstrual period 02/20/2012, SpO2 98.00%. Physical Exam  Constitutional: She is oriented to person, place, and time. She appears well-developed. No distress.  HENT:  Head: Normocephalic and atraumatic.  Eyes: Pupils are equal, round, and reactive to light. No scleral icterus.  Respiratory: Effort normal. No respiratory distress.  GI: Soft. She exhibits no distension.  Neurological: She is alert and oriented to person, place, and time.  Skin: Skin is warm and dry. She is not diaphoretic.  Psychiatric: She has a normal mood and affect. Her behavior is normal. Judgment and thought content normal.     Assessment/Plan  History of rectal cancer Remove port a cath. Discussed risks and benefits.    [name] 04/29/2014, 8:36 AM

## 2014-04-29 NOTE — Op Note (Signed)
  PRE-OPERATIVE DIAGNOSIS:  un-needed Port-A-Cath for rectal cancer  POST-OPERATIVE DIAGNOSIS:  Same   PROCEDURE:  Procedure(s):  REMOVAL PORT-A-CATH  SURGEON:  Surgeon(s):  Stark Klein, MD  ANESTHESIA:   MAC + local  EBL:   Minimal  SPECIMEN:  None  Complications : none known  Procedure:   Pt was  identified in the holding area and taken to the operating room where she was placed supine on the operating room table.  MAC anesthesia was induced.  The L upper chest was prepped and draped.  The prior incision was anesthetized with local anesthetic.  The incision was opened with a #15 blade.  The subcutaneous tissue was divided with the cautery.  The port was identified and the capsule opened.  The 4 2-0 prolene sutures were removed.  The port was then removed and pressure held on the tract.  The catheter appeared intact without evidence of breakage.  The wound was inspected for hemostasis, which was achieved with cautery.  The wound was closed with 3-0 vicryl deep dermal interrupted sutures and 4-0 Monocryl running subcuticular suture.  The wound was cleaned, dried, and dressed with dermabond.  The patient was awakened from anesthesia and taken to the PACU in stable condition.  Needle, sponge, and instrument counts are correct.

## 2014-04-29 NOTE — Anesthesia Postprocedure Evaluation (Signed)
Anesthesia Post Note  Patient: Renee Nicholson  Procedure(s) Performed: Procedure(s) (LRB): REMOVAL PORT-A-CATH (Left)  Anesthesia type: MAC  Patient location: PACU  Post pain: Pain level controlled and Adequate analgesia  Post assessment: Post-op Vital signs reviewed, Patient's Cardiovascular Status Stable and Respiratory Function Stable  Last Vitals:  Filed Vitals:   04/29/14 1006  BP: 122/61  Pulse: 74  Temp: 36.9 C  Resp: 18    Post vital signs: Reviewed and stable  Level of consciousness: awake, alert  and oriented  Complications: No apparent anesthesia complications

## 2014-04-29 NOTE — Anesthesia Preprocedure Evaluation (Signed)
Anesthesia Evaluation  Patient identified by MRN, date of birth, ID band Patient awake    Reviewed: Allergy & Precautions, H&P , NPO status , Patient's Chart, lab work & pertinent test results  Airway Mallampati: II  Neck ROM: full    Dental   Pulmonary neg pulmonary ROS,          Cardiovascular negative cardio ROS      Neuro/Psych    GI/Hepatic H/o rectal CA   Endo/Other    Renal/GU      Musculoskeletal   Abdominal   Peds  Hematology   Anesthesia Other Findings   Reproductive/Obstetrics                           Anesthesia Physical Anesthesia Plan  ASA: II  Anesthesia Plan: MAC   Post-op Pain Management:    Induction: Intravenous  Airway Management Planned: Simple Face Mask  Additional Equipment:   Intra-op Plan:   Post-operative Plan:   Informed Consent: I have reviewed the patients History and Physical, chart, labs and discussed the procedure including the risks, benefits and alternatives for the proposed anesthesia with the patient or authorized representative who has indicated his/her understanding and acceptance.     Plan Discussed with: CRNA, Anesthesiologist and Surgeon  Anesthesia Plan Comments:         Anesthesia Quick Evaluation

## 2014-04-29 NOTE — Discharge Instructions (Addendum)
[facility] °Office Phone Number [phone] ° ° POST OP INSTRUCTIONS ° °Always review your discharge instruction sheet given to you by the facility where your surgery was performed. ° °IF YOU HAVE DISABILITY OR FAMILY LEAVE FORMS, YOU MUST BRING THEM TO THE OFFICE FOR PROCESSING.  DO NOT GIVE THEM TO YOUR DOCTOR. ° °1. A prescription for pain medication may be given to you upon discharge.  Take your pain medication as prescribed, if needed.  If narcotic pain medicine is not needed, then you may take acetaminophen (Tylenol) or ibuprofen (Advil) as needed. °2. Take your usually prescribed medications unless otherwise directed °3. If you need a refill on your pain medication, please contact your pharmacy.  They will contact our office to request authorization.  Prescriptions will not be filled after 5pm or on week-ends. °4. You should eat very light the first 24 hours after surgery, such as soup, crackers, pudding, etc.  Resume your normal diet the day after surgery °5. It is common to experience some constipation if taking pain medication after surgery.  Increasing fluid intake and taking a stool softener will usually help or prevent this problem from occurring.  A mild laxative (Milk of Magnesia or Miralax) should be taken according to package directions if there are no bowel movements after 48 hours. °6. You may shower in 48 hours.  The surgical glue will flake off in 2-3 weeks.   °7. ACTIVITIES:  No strenuous activity or heavy lifting for 1 week.   °a. You may drive when you no longer are taking prescription pain medication, you can comfortably wear a seatbelt, and you can safely maneuver your car and apply brakes. °b. RETURN TO WORK:  __________1 week_______________ °You should see your doctor in the office for a follow-up appointment approximately three-four weeks after your surgery.   ° °WHEN TO CALL YOUR DOCTOR: °1. Fever over 101.0 °2. Nausea and/or vomiting. °3. Extreme swelling or  bruising. °4. Continued bleeding from incision. °5. Increased pain, redness, or drainage from the incision. ° °The clinic staff is available to answer your questions during regular business hours.  Please don’t hesitate to call and ask to speak to one of the nurses for clinical concerns.  If you have a medical emergency, go to the nearest emergency room or call 911.  A surgeon from [facility] is always on call at the hospital. ° °For further questions, please visit [email or web address]  ° ° ° °Post Anesthesia Home Care Instructions ° °Activity: °Get plenty of rest for the remainder of the day. A responsible adult should stay with you for 24 hours following the procedure.  °For the next 24 hours, DO NOT: °-Drive a car °-Operate machinery °-Drink alcoholic beverages °-Take any medication unless instructed by your physician °-Make any legal decisions or sign important papers. ° °Meals: °Start with liquid foods such as gelatin or soup. Progress to regular foods as tolerated. Avoid greasy, spicy, heavy foods. If nausea and/or vomiting occur, drink only clear liquids until the nausea and/or vomiting subsides. Call your physician if vomiting continues. ° °Special Instructions/Symptoms: °Your throat may feel dry or sore from the anesthesia or the breathing tube placed in your throat during surgery. If this causes discomfort, gargle with warm salt water. The discomfort should disappear within 24 hours. ° °

## 2014-04-29 NOTE — Transfer of Care (Signed)
Immediate Anesthesia Transfer of Care Note  Patient: Renee Nicholson  Procedure(s) Performed: Procedure(s): REMOVAL PORT-A-CATH (Left)  Patient Location: PACU  Anesthesia Type:MAC  Level of Consciousness: awake, sedated and patient cooperative  Airway & Oxygen Therapy: Patient Spontanous Breathing and Patient connected to face mask oxygen  Post-op Assessment: Report given to PACU RN and Post -op Vital signs reviewed and stable  Post vital signs: Reviewed and stable  Complications: No apparent anesthesia complications

## 2014-04-30 ENCOUNTER — Encounter (HOSPITAL_BASED_OUTPATIENT_CLINIC_OR_DEPARTMENT_OTHER): Payer: Self-pay | Admitting: General Surgery

## 2014-05-26 ENCOUNTER — Encounter (INDEPENDENT_AMBULATORY_CARE_PROVIDER_SITE_OTHER): Payer: Medicare Other | Admitting: General Surgery

## 2014-06-02 ENCOUNTER — Telehealth: Payer: Self-pay | Admitting: Gastroenterology

## 2014-06-02 NOTE — Telephone Encounter (Signed)
PATIENT HUSBAND DROPPED OFF DISABILITY FORMS PLEASE CALL WHEN THEY ARE COMPLETED [phone]

## 2014-06-08 ENCOUNTER — Telehealth: Payer: Self-pay | Admitting: Gastroenterology

## 2014-06-08 NOTE — Telephone Encounter (Signed)
PLEASE CALL PT'S FAMILY. THE FMLA PAPERS WILL BE READY THUR SEP 17 AFTER 2 PM.

## 2014-06-08 NOTE — Telephone Encounter (Signed)
Pt's husband came by the office to see if FMLA papers were ready yet. I told him that I didn't have anything upfront and I would have to check with SF to see if she's gotten to those yet. He said that he will need them this week so he can get it in the mail. Please advise. [phone]

## 2014-06-08 NOTE — Telephone Encounter (Signed)
REVIEWED-NO ADDITIONAL RECOMMENDATIONS. 

## 2014-06-09 NOTE — Telephone Encounter (Signed)
Called back and informed pt.

## 2014-06-09 NOTE — Telephone Encounter (Signed)
Tried to call, person said he doesn't speak English and to call back after 9:00 AM.

## 2014-06-11 NOTE — Telephone Encounter (Signed)
FAX PAPERS TO [employer](FORM, [place] JUL 2015, NOTES FROM FEB AND JUN 2015). CALL PT TO PICK UP ORIGINALS.

## 2014-06-11 NOTE — Telephone Encounter (Signed)
I have faxed papers and clinical information to the insurance company and left papers on Doris's desk to give to pt.

## 2014-06-12 NOTE — Telephone Encounter (Signed)
I called and informed pt. She said she will come on Mon to pick up. She is aware that the papers have been faxed.

## 2014-07-01 ENCOUNTER — Other Ambulatory Visit: Payer: Self-pay | Admitting: Gastroenterology

## 2014-07-03 ENCOUNTER — Other Ambulatory Visit: Payer: Self-pay | Admitting: Gastroenterology

## 2014-08-04 ENCOUNTER — Encounter: Payer: Self-pay | Admitting: Gastroenterology

## 2014-08-07 ENCOUNTER — Other Ambulatory Visit (HOSPITAL_COMMUNITY): Payer: Self-pay | Admitting: Internal Medicine

## 2014-08-07 ENCOUNTER — Ambulatory Visit (HOSPITAL_COMMUNITY)
Admission: RE | Admit: 2014-08-07 | Discharge: 2014-08-07 | Disposition: A | Discharge status: Home / Self Care | Payer: Medicare Other | Source: Ambulatory Visit | Attending: Internal Medicine | Admitting: Internal Medicine

## 2014-08-07 DIAGNOSIS — R61 Generalized hyperhidrosis: Secondary | ICD-10-CM | POA: Diagnosis not present

## 2014-08-07 DIAGNOSIS — R05 Cough: Secondary | ICD-10-CM | POA: Insufficient documentation

## 2014-08-07 DIAGNOSIS — Z85048 Personal history of other malignant neoplasm of rectum, rectosigmoid junction, and anus: Secondary | ICD-10-CM | POA: Diagnosis not present

## 2014-08-07 DIAGNOSIS — R059 Cough, unspecified: Secondary | ICD-10-CM

## 2014-08-26 ENCOUNTER — Telehealth: Payer: Self-pay | Admitting: Oncology

## 2014-08-26 NOTE — Telephone Encounter (Signed)
Pt's husband called to r/s states they are going out of town, confirmed labs/ov, mailed out schedule.... KJ

## 2014-09-02 ENCOUNTER — Other Ambulatory Visit: Payer: Self-pay

## 2014-09-02 MED ORDER — PAREGORIC 2 MG/5ML PO TINC: ORAL | Status: DC

## 2014-09-02 NOTE — Telephone Encounter (Signed)
I changed the dose

## 2014-09-02 NOTE — Telephone Encounter (Signed)
Please clarify dose. We have patient on different dose per last OV.

## 2014-09-03 ENCOUNTER — Telehealth: Payer: Self-pay

## 2014-09-03 NOTE — Telephone Encounter (Signed)
I called pt and told her the Rx for paregoric is ready for pick up. She is aware we will close today for lunch and she will come before 11:00 AM or after 2:00 PM. She is aware tomorrow we close at 12:00 Noon.

## 2014-09-04 ENCOUNTER — Other Ambulatory Visit: Payer: BC Managed Care – PPO

## 2014-09-04 ENCOUNTER — Ambulatory Visit: Payer: BC Managed Care – PPO | Admitting: Oncology

## 2014-09-17 ENCOUNTER — Telehealth: Payer: Self-pay | Admitting: Oncology

## 2014-09-17 ENCOUNTER — Ambulatory Visit (HOSPITAL_BASED_OUTPATIENT_CLINIC_OR_DEPARTMENT_OTHER): Payer: Medicare Other | Admitting: Oncology

## 2014-09-17 ENCOUNTER — Other Ambulatory Visit (HOSPITAL_BASED_OUTPATIENT_CLINIC_OR_DEPARTMENT_OTHER): Payer: Medicare Other

## 2014-09-17 VITALS — BP 127/65 | HR 65 | Temp 98.2°F | Resp 18 | Ht <= 58 in | Wt 94.2 lb

## 2014-09-17 DIAGNOSIS — Z85048 Personal history of other malignant neoplasm of rectum, rectosigmoid junction, and anus: Secondary | ICD-10-CM

## 2014-09-17 DIAGNOSIS — C20 Malignant neoplasm of rectum: Secondary | ICD-10-CM

## 2014-09-17 LAB — CBC WITH DIFFERENTIAL/PLATELET
BASO%: 0.6 % (ref 0.0–2.0)
BASOS ABS: 0 10*3/uL (ref 0.0–0.1)
EOS%: 1.5 % (ref 0.0–7.0)
Eosinophils Absolute: 0.1 10*3/uL (ref 0.0–0.5)
HCT: 39.9 % (ref 34.8–46.6)
HEMOGLOBIN: 13.2 g/dL (ref 11.6–15.9)
LYMPH%: 19.4 % (ref 14.0–49.7)
MCH: 29.2 pg (ref 25.1–34.0)
MCHC: 32.9 g/dL (ref 31.5–36.0)
MCV: 88.7 fL (ref 79.5–101.0)
MONO#: 0.5 10*3/uL (ref 0.1–0.9)
MONO%: 9.5 % (ref 0.0–14.0)
NEUT#: 3.6 10*3/uL (ref 1.5–6.5)
NEUT%: 69 % (ref 38.4–76.8)
PLATELETS: 214 10*3/uL (ref 145–400)
RBC: 4.5 10*6/uL (ref 3.70–5.45)
RDW: 13.2 % (ref 11.2–14.5)
WBC: 5.2 10*3/uL (ref 3.9–10.3)
lymph#: 1 10*3/uL (ref 0.9–3.3)

## 2014-09-17 LAB — CEA: CEA: 0.6 ng/mL (ref 0.0–5.0)
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
## 2014-09-21 ENCOUNTER — Telehealth: Payer: Self-pay | Admitting: *Deleted

## 2014-09-21 NOTE — Telephone Encounter (Signed)
Per Dr. Benay Spice; notified pt's husband cea is normal.  Pt's husband verbalized understanding and confirmed appt 03/19/15.

## 2014-09-21 NOTE — Telephone Encounter (Signed)
-----   Message from Ladell Pier, MD sent at 09/19/2014  6:29 PM EST ----- Please call patient, cea is normal

## 2014-11-12 ENCOUNTER — Other Ambulatory Visit: Payer: Self-pay

## 2014-11-12 DIAGNOSIS — C20 Malignant neoplasm of rectum: Secondary | ICD-10-CM

## 2014-11-15 MED ORDER — ZINC OXIDE 20 % EX OINT: TOPICAL_OINTMENT | CUTANEOUS | Status: DC

## 2014-11-17 ENCOUNTER — Other Ambulatory Visit: Payer: Self-pay

## 2014-11-17 ENCOUNTER — Telehealth: Payer: Self-pay

## 2014-11-17 DIAGNOSIS — C20 Malignant neoplasm of rectum: Secondary | ICD-10-CM

## 2014-11-17 NOTE — Telephone Encounter (Signed)
[facility] filled it within past two weeks but it looks like it printed and may have been faxed?. Please find out if they received that request.

## 2014-11-17 NOTE — Telephone Encounter (Signed)
Renee Nicholson, I called the pharmacy and spoke to [facility] who said they do not have it. She has checked everything.

## 2014-11-17 NOTE — Telephone Encounter (Signed)
[facility] sent refill request for Nystatin: HC 2.5% Zincoxide/ Apply to affected area 3 times a day   90 days at a time I could not find how to put the request in. Thanks You!

## 2014-11-17 NOTE — Telephone Encounter (Signed)
Opened in error

## 2014-11-18 MED ORDER — ZINC OXIDE 20 % EX OINT: TOPICAL_OINTMENT | CUTANEOUS | Status: AC

## 2014-11-18 NOTE — Telephone Encounter (Signed)
Faxed

## 2014-11-18 NOTE — Telephone Encounter (Signed)
Nystatin/HC 2.5%/Zinc oxide RX done, needs to be faxed.

## 2014-11-26 ENCOUNTER — Ambulatory Visit (INDEPENDENT_AMBULATORY_CARE_PROVIDER_SITE_OTHER): Payer: Medicare Other | Admitting: Gastroenterology

## 2014-11-26 ENCOUNTER — Encounter: Payer: Self-pay | Admitting: Gastroenterology

## 2014-11-26 VITALS — BP 105/73 | HR 79 | Temp 98.9°F | Ht <= 58 in | Wt 93.6 lb

## 2014-11-26 DIAGNOSIS — E43 Unspecified severe protein-calorie malnutrition: Secondary | ICD-10-CM

## 2014-11-26 DIAGNOSIS — R197 Diarrhea, unspecified: Secondary | ICD-10-CM | POA: Diagnosis not present

## 2014-11-26 DIAGNOSIS — C20 Malignant neoplasm of rectum: Secondary | ICD-10-CM | POA: Diagnosis not present

## 2014-11-26 DIAGNOSIS — K6289 Other specified diseases of anus and rectum: Secondary | ICD-10-CM

## 2014-11-26 MED ORDER — DIPHENOXYLATE-ATROPINE 2.5-0.025 MG PO TABS: 2.0000 | ORAL_TABLET | Freq: Four times a day (QID) | ORAL | Status: DC

## 2014-11-26 MED ORDER — LOPERAMIDE HCL 2 MG PO CAPS: ORAL_CAPSULE | ORAL | Status: DC

## 2014-11-26 NOTE — Assessment & Plan Note (Signed)
SX CONTROLLED WITH IMODIUM AND LOMOTIL.   CONTINUE LOMOTIL/IMODIUM. REFILLS FOR 1 YEAR.

## 2014-11-26 NOTE — Progress Notes (Signed)
cc'ed to pcp °

## 2014-11-26 NOTE — Progress Notes (Signed)
ON RECALL LIST  °

## 2014-11-26 NOTE — Assessment & Plan Note (Addendum)
NO WARNING SIGNS/SYMPTOMS  ANOSCOPY UNDER ANESTHESIA W/O ENEMA FEB 2017. ALL SISTERS, BROTHERS, CHILDREN, AND PARENTS NEED TO HAVE A COLONOSCOPY STARTING AT THE AGE OF 36 and then every 5 years.

## 2014-11-26 NOTE — Assessment & Plan Note (Signed)
SYMPTOMS FAIRLY WELL CONTROLLED WITH DESITIN/NYSATIN.  USE CREAM AS NEEDED. CONTINUE TO REFILL.

## 2014-11-26 NOTE — Patient Instructions (Signed)
FOLLOW UP IN ONE YEAR AND THEN WE WILL SCHEDULE YOUR ANOSCOPY UNDER ANESTHESIA IN FEB 2017.  ALL SISTERS, BROTHERS, CHILDREN, AND PARENTS NEED TO HAVE A COLONOSCOPY STARTING AT THE AGE OF 36 and then every 5 years.  DRINK WATER TO KEEP YOUR URINE LIGHT YELLOW.  FOLLOW A HIGH FIBER DIET. SEE INFO BELOW.  High-Fiber Diet A high-fiber diet changes your normal diet to include more whole grains, legumes, fruits, and vegetables. Changes in the diet involve replacing refined carbohydrates with unrefined foods. The calorie level of the diet is essentially unchanged. The Dietary Reference Intake (recommended amount) for adult males is 38 grams per day. For adult females, it is 25 grams per day. Pregnant and lactating women should consume 28 grams of fiber per day. Fiber is the intact part of a plant that is not broken down during digestion. Functional fiber is fiber that has been isolated from the plant to provide a beneficial effect in the body. PURPOSE  Increase stool bulk.   Ease and regulate bowel movements.   Lower cholesterol.  INDICATIONS THAT YOU NEED MORE FIBER  Constipation and hemorrhoids.   Uncomplicated diverticulosis (intestine condition) and irritable bowel syndrome.   Weight management.   As a protective measure against hardening of the arteries (atherosclerosis), diabetes, and cancer.   GUIDELINES FOR INCREASING FIBER IN THE DIET  Start adding fiber to the diet slowly. A gradual increase of about 5 more grams (2 slices of whole-wheat bread, 2 servings of most fruits or vegetables, or 1 bowl of high-fiber cereal) per day is best. Too rapid an increase in fiber may result in constipation, flatulence, and bloating.   Drink enough water and fluids to keep your urine clear or pale yellow. Water, juice, or caffeine-free drinks are recommended. Not drinking enough fluid may cause constipation.   Eat a variety of high-fiber foods rather than one type of fiber.   Try to increase  your intake of fiber through using high-fiber foods rather than fiber pills or supplements that contain small amounts of fiber.   The goal is to change the types of food eaten. Do not supplement your present diet with high-fiber foods, but replace foods in your present diet.  INCLUDE A VARIETY OF FIBER SOURCES  Replace refined and processed grains with whole grains, canned fruits with fresh fruits, and incorporate other fiber sources. White rice, white breads, and most bakery goods contain little or no fiber.   Brown whole-grain rice, buckwheat oats, and many fruits and vegetables are all good sources of fiber. These include: broccoli, Brussels sprouts, cabbage, cauliflower, beets, sweet potatoes, white potatoes (skin on), carrots, tomatoes, eggplant, squash, berries, fresh fruits, and dried fruits.   Cereals appear to be the richest source of fiber. Cereal fiber is found in whole grains and bran. Bran is the fiber-rich outer coat of cereal grain, which is largely removed in refining. In whole-grain cereals, the bran remains. In breakfast cereals, the largest amount of fiber is found in those with "bran" in their names. The fiber content is sometimes indicated on the label.   You may need to include additional fruits and vegetables each day.   In baking, for 1 cup white flour, you may use the following substitutions:   1 cup whole-wheat flour minus 2 tablespoons.   1/2 cup white flour plus 1/2 cup whole-wheat flour.

## 2014-11-26 NOTE — Addendum Note (Signed)
Addended by: Danie Binder on: 11/26/2014 12:04 PM   Modules accepted: Orders
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
## 2014-11-26 NOTE — Assessment & Plan Note (Signed)
NO WARNING SIGNS/SYMPTOMS  CONTINUE TO MONITOR SYMPTOMS. 

## 2015-03-19 ENCOUNTER — Ambulatory Visit (HOSPITAL_BASED_OUTPATIENT_CLINIC_OR_DEPARTMENT_OTHER): Payer: Medicare Other | Admitting: Nurse Practitioner

## 2015-03-19 ENCOUNTER — Other Ambulatory Visit (HOSPITAL_BASED_OUTPATIENT_CLINIC_OR_DEPARTMENT_OTHER): Payer: Medicare Other

## 2015-03-19 ENCOUNTER — Telehealth: Payer: Self-pay | Admitting: Nurse Practitioner

## 2015-03-19 VITALS — BP 107/68 | HR 58 | Temp 97.7°F | Resp 18 | Ht <= 58 in | Wt 91.4 lb

## 2015-03-19 DIAGNOSIS — C20 Malignant neoplasm of rectum: Secondary | ICD-10-CM

## 2015-03-19 DIAGNOSIS — Z85048 Personal history of other malignant neoplasm of rectum, rectosigmoid junction, and anus: Secondary | ICD-10-CM | POA: Diagnosis present

## 2015-03-19 LAB — CBC WITH DIFFERENTIAL/PLATELET
BASO%: 0.4 % (ref 0.0–2.0)
Basophils Absolute: 0 10*3/uL (ref 0.0–0.1)
EOS%: 2.2 % (ref 0.0–7.0)
Eosinophils Absolute: 0.1 10*3/uL (ref 0.0–0.5)
HEMATOCRIT: 37.4 % (ref 34.8–46.6)
HGB: 12.7 g/dL (ref 11.6–15.9)
LYMPH%: 21.5 % (ref 14.0–49.7)
MCH: 29.6 pg (ref 25.1–34.0)
MCHC: 34 g/dL (ref 31.5–36.0)
MCV: 87.2 fL (ref 79.5–101.0)
MONO#: 0.7 10*3/uL (ref 0.1–0.9)
MONO%: 12.3 % (ref 0.0–14.0)
NEUT#: 3.4 10*3/uL (ref 1.5–6.5)
NEUT%: 63.6 % (ref 38.4–76.8)
Platelets: 214 10*3/uL (ref 145–400)
RBC: 4.29 10*6/uL (ref 3.70–5.45)
RDW: 12.8 % (ref 11.2–14.5)
WBC: 5.4 10*3/uL (ref 3.9–10.3)
lymph#: 1.2 10*3/uL (ref 0.9–3.3)

## 2015-03-19 NOTE — Telephone Encounter (Signed)
per pof to [facility] pt appt-gave pt avs
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
## 2015-03-20 LAB — CEA: CEA: <0.5 ng/mL (ref 0.0–5.0)

## 2015-03-23 ENCOUNTER — Telehealth: Payer: Self-pay | Admitting: *Deleted

## 2015-03-23 NOTE — Telephone Encounter (Signed)
Called pt per Dr. Benay Spice, CEA is normal. Pt voiced understanding and appreciated call.

## 2015-03-23 NOTE — Telephone Encounter (Signed)
-----   Message from Ladell Pier, MD sent at 03/22/2015  6:37 PM EDT ----- Please call patient, Renee Nicholson is normal

## 2015-03-23 NOTE — Telephone Encounter (Signed)
Called and informed patient that cea is normal.  Per Dr. Sherrill.  Patient verbalized understanding.  

## 2015-03-23 NOTE — Telephone Encounter (Signed)
-----   Message from Ladell Pier, MD sent at 03/22/2015  6:37 PM EDT ----- Please call patient, cea is normal

## 2015-03-25 ENCOUNTER — Telehealth: Payer: Self-pay

## 2015-03-25 NOTE — Telephone Encounter (Signed)
pts husband called and states that pt is having some rectal pain. No sure what is going on with her. He thinks that Dr. Oneida Alar may need to examine her. Please Advise

## 2015-04-06 NOTE — Telephone Encounter (Signed)
PLEASE CALL PT. SEE IF HER RECTAL PAIN HAS IMPROVED.

## 2015-04-07 NOTE — Telephone Encounter (Signed)
Called, many rings and no answer.

## 2015-04-07 NOTE — Telephone Encounter (Signed)
Called and spoke to pt's husband. He said she is in the shower now, and he will talk to her when she gets out and give me a call.

## 2015-04-09 NOTE — Telephone Encounter (Signed)
Pt said she is doing much better now. She does not have an appt and would like to know when you would like for her to come in.

## 2015-04-14 NOTE — Telephone Encounter (Addendum)
PLEASE CALL PT. IF SHE IS DOING BETTER, SHE MAY FOLLOW UP IN FEB 2017 AND THEN WE WILL SCHEDULE YOUR ANOSCOPY WITH SEDATION IN [facility] 2017.

## 2015-06-18 ENCOUNTER — Other Ambulatory Visit: Payer: Self-pay | Admitting: General Surgery

## 2015-06-18 DIAGNOSIS — Z1239 Encounter for other screening for malignant neoplasm of breast: Secondary | ICD-10-CM

## 2015-06-18 DIAGNOSIS — Z85048 Personal history of other malignant neoplasm of rectum, rectosigmoid junction, and anus: Secondary | ICD-10-CM

## 2015-06-21 ENCOUNTER — Other Ambulatory Visit (HOSPITAL_COMMUNITY): Payer: Self-pay | Admitting: General Surgery

## 2015-06-21 DIAGNOSIS — C20 Malignant neoplasm of rectum: Secondary | ICD-10-CM

## 2015-06-21 DIAGNOSIS — Z1231 Encounter for screening mammogram for malignant neoplasm of breast: Secondary | ICD-10-CM

## 2015-06-23 ENCOUNTER — Other Ambulatory Visit (HOSPITAL_COMMUNITY): Payer: Self-pay | Admitting: General Surgery

## 2015-06-23 DIAGNOSIS — Z1231 Encounter for screening mammogram for malignant neoplasm of breast: Secondary | ICD-10-CM

## 2015-07-01 ENCOUNTER — Ambulatory Visit (HOSPITAL_COMMUNITY): Payer: Medicare Other

## 2015-07-02 ENCOUNTER — Ambulatory Visit (HOSPITAL_COMMUNITY)
Admission: RE | Admit: 2015-07-02 | Discharge: 2015-07-02 | Disposition: A | Discharge status: Home / Self Care | Payer: Medicare Other | Source: Ambulatory Visit | Attending: General Surgery | Admitting: General Surgery

## 2015-07-02 DIAGNOSIS — Z1231 Encounter for screening mammogram for malignant neoplasm of breast: Secondary | ICD-10-CM | POA: Insufficient documentation

## 2015-07-06 ENCOUNTER — Other Ambulatory Visit: Payer: Self-pay | Admitting: General Surgery

## 2015-07-06 DIAGNOSIS — R928 Other abnormal and inconclusive findings on diagnostic imaging of breast: Secondary | ICD-10-CM

## 2015-07-13 ENCOUNTER — Other Ambulatory Visit: Payer: Self-pay | Admitting: General Surgery

## 2015-07-13 ENCOUNTER — Ambulatory Visit (HOSPITAL_COMMUNITY)
Admission: RE | Admit: 2015-07-13 | Discharge: 2015-07-13 | Disposition: A | Discharge status: Home / Self Care | Payer: Medicare Other | Source: Ambulatory Visit | Attending: General Surgery | Admitting: General Surgery

## 2015-07-13 DIAGNOSIS — R921 Mammographic calcification found on diagnostic imaging of breast: Secondary | ICD-10-CM | POA: Insufficient documentation

## 2015-07-13 DIAGNOSIS — Z85048 Personal history of other malignant neoplasm of rectum, rectosigmoid junction, and anus: Secondary | ICD-10-CM | POA: Diagnosis not present

## 2015-07-13 DIAGNOSIS — R928 Other abnormal and inconclusive findings on diagnostic imaging of breast: Secondary | ICD-10-CM

## 2015-07-15 ENCOUNTER — Other Ambulatory Visit: Payer: Self-pay | Admitting: General Surgery

## 2015-07-15 DIAGNOSIS — R921 Mammographic calcification found on diagnostic imaging of breast: Secondary | ICD-10-CM

## 2015-07-16 ENCOUNTER — Ambulatory Visit
Admission: RE | Admit: 2015-07-16 | Discharge: 2015-07-16 | Disposition: A | Discharge status: Home / Self Care | Payer: Medicare Other | Source: Ambulatory Visit | Attending: General Surgery | Admitting: General Surgery

## 2015-07-16 DIAGNOSIS — R921 Mammographic calcification found on diagnostic imaging of breast: Secondary | ICD-10-CM

## 2015-07-19 ENCOUNTER — Telehealth: Payer: Self-pay

## 2015-07-19 NOTE — Telephone Encounter (Signed)
Dr.Fields, I spoke to pt's husband at the front and tried to explain that you would not be the one to contact for that. He said that you had ordered it or had it ordered and was supposed to let them know.

## 2015-07-19 NOTE — Telephone Encounter (Signed)
I called and informed pt and her husband.

## 2015-07-19 NOTE — Telephone Encounter (Signed)
PLEASE CALL PT. HER BIOPSY SHOWS FIBROCYSTIC CHANGES. SHE WILL NEED TO TALK TO DR. BYERLY TO ASK IF THERE IS ANYTHING ELSE SHE NEEDS TO DO.

## 2015-07-19 NOTE — Telephone Encounter (Signed)
[phone]   [phone] (CELL)   PATIENT HUSBAND INQUIRING ABOUT RESULTS FROM HER BREAST BIOPSY

## 2015-07-20 ENCOUNTER — Inpatient Hospital Stay: Admission: RE | Admit: 2015-07-20 | Payer: Medicare Other | Source: Ambulatory Visit

## 2015-08-20 ENCOUNTER — Other Ambulatory Visit: Payer: Self-pay | Admitting: Gastroenterology

## 2015-09-23 ENCOUNTER — Ambulatory Visit: Payer: Medicare Other | Admitting: Oncology

## 2015-09-23 ENCOUNTER — Ambulatory Visit (HOSPITAL_BASED_OUTPATIENT_CLINIC_OR_DEPARTMENT_OTHER): Payer: Medicare Other | Admitting: Nurse Practitioner

## 2015-09-23 ENCOUNTER — Other Ambulatory Visit (HOSPITAL_BASED_OUTPATIENT_CLINIC_OR_DEPARTMENT_OTHER): Payer: Medicare Other

## 2015-09-23 VITALS — BP 114/74 | HR 60 | Temp 98.3°F | Resp 18 | Ht <= 58 in | Wt 95.3 lb

## 2015-09-23 DIAGNOSIS — R197 Diarrhea, unspecified: Secondary | ICD-10-CM | POA: Diagnosis not present

## 2015-09-23 DIAGNOSIS — C20 Malignant neoplasm of rectum: Secondary | ICD-10-CM

## 2015-09-23 DIAGNOSIS — Z85048 Personal history of other malignant neoplasm of rectum, rectosigmoid junction, and anus: Secondary | ICD-10-CM

## 2015-09-24 LAB — CEA: CEA: <0.5 ng/mL (ref 0.0–5.0)

## 2015-09-27 ENCOUNTER — Encounter: Payer: Self-pay | Admitting: Nurse Practitioner

## 2015-09-27 NOTE — Assessment & Plan Note (Signed)
Patient underwent a loop ileostomy in 2013; and an ostomy takedown in 2014.  Patient presents to the [facility] today for her six-month follow-up.  She states she's been doing very well recently; and denies any issues with either fatigue or appetite.  She also denies any nausea, vomiting, or abdominal discomfort.  She does report chronic issues with diarrhea; and takes Imodium on an as-needed basis.  She denies any recent fevers or chills.  Patient states that she found a right breast mass this fall; and underwent a right breast mammogram in biopsy on 07/19/2015.  Biopsy was negative.  Patient is recommended to have a repeat diagnostic mammogram on 01/18/2016.  CEA obtained today is still pending results.  Patient is scheduled to return in approximate 6 months for labs which will include a CBC with differential, a complete metabolic panel, and a CEA; and a follow-up visit with Dr. Benay Spice.  Patient knows to call in the interim with any new worries or concerns.

## 2015-09-27 NOTE — Assessment & Plan Note (Signed)
Patient reports chronic diarrhea since her initial cancer surgery.  She states she has between one and 3 loose bowel movements per day.  She takes Imodium on an as-needed basis.

## 2015-09-27 NOTE — Progress Notes (Signed)
SYMPTOM MANAGEMENT CLINIC   HPI: Renee Nicholson 50 y.o. female diagnosed with rectal cancer.  Patient is status post loop ileostomy and subsequent takedown.  Currently undergoing observation only and here for six-month follow-up.   Patient underwent a loop ileostomy in 2013; and an ostomy takedown in 2014.  Patient presents to the [facility] today for her six-month follow-up.  She states she's been doing very well recently; and denies any issues with either fatigue or appetite.  She also denies any nausea, vomiting, or abdominal discomfort.  She does report chronic issues with diarrhea; and takes Imodium on an as-needed basis.  She denies any recent fevers or chills.  Patient states that she found a right breast mass this fall; and underwent a right breast mammogram in biopsy on 07/19/2015.  Biopsy was negative.  Patient is recommended to have a repeat diagnostic mammogram on 01/18/2016.  CEA obtained today is still pending results.  Patient is scheduled to return in approximate 6 months for labs which will include a CBC with differential, a complete metabolic panel, and a CEA; and a follow-up visit with Dr. Benay Spice.  Patient knows to call in the interim with any new worries or concerns.  HPI  ROS  Past Medical History  Diagnosis Date  . Anemia   . Diarrhea   . Bright red rectal bleeding 09/13/2011  . History of chemotherapy     completed 09/2011   . Hx of radiation therapy 08/29/11 to 10/09/11    rectum  . Ileostomy in place [facility])   . Cancer ([place])   . Rectal cancer ([place])   . Hx of tracheostomy ([place])     12/2012    Past Surgical History  Procedure Laterality Date  . Tubal ligation    . Carpal tunnel release    . Colonoscopy  07/26/2011    Procedure: COLONOSCOPY;  Surgeon: Dorothyann Peng, MD;  [facility];  Service: Endoscopy;  Laterality: N/A;  10:40  . Esophagogastroduodenoscopy  07/26/2011    Procedure: ESOPHAGOGASTRODUODENOSCOPY (EGD);  Surgeon: Dorothyann Peng, MD;  [facility];  Service: Endoscopy;  Laterality: N/A;  . Esophagogastroduodenoscopy  12/27/2011    Procedure: ESOPHAGOGASTRODUODENOSCOPY (EGD);  Surgeon: Lear Ng, MD;  Location: [facility];  Service: Endoscopy;  Laterality: N/A;  . Laparoscopic colon resection  12/14/11    diverting ileostomy  . Portacath placement  02/21/2012    Procedure: INSERTION PORT-A-CATH;  Surgeon: Stark Klein, MD;  Location: [place];  Service: General;  Laterality: N/A;  . Evaluation under anesthesia with anal fissurotomy N/A 11/06/2012    Procedure: Exam Under Anesthesia , Anal Dilation;  Surgeon: Stark Klein, MD;  [facility];  Service: General;  Laterality: N/A;  Exam Under Anesthesia , Anal Dilation  . Laparotomy N/A 01/03/2013    Procedure: EXPLORATORY LAPAROTOMY WITH LYSIS OF ADHESIONS, revision of ileostomy;  Surgeon: Stark Klein, MD;  Location: [place];  Service: General;  Laterality: N/A;  . Ileostomy closure N/A 04/24/2013    Procedure: ILEOSTOMY TAKEDOWN;  Surgeon: Stark Klein, MD;  [facility];  Service: General;  Laterality: N/A;  . I&d extremity Left 05/31/2013    Procedure: IRRIGATION AND DEBRIDEMENT EXTREMITY;  Surgeon: Linna Hoff, MD;  Location: [facility];  Service: Orthopedics;  Laterality: Left;  . Open reduction internal fixation (orif) metacarpal Left 05/31/2013    Procedure: OPEN REDUCTION INTERNAL FIXATION (ORIF) METACARPAL;  Surgeon: Linna Hoff, MD;  Location: [facility];  Service: Orthopedics;  Laterality: Left;  . Port-a-cath removal Left 04/29/2014    Procedure: REMOVAL PORT-A-CATH;  Surgeon: Stark Klein, MD;  Location: [facility];  Service: General;  Laterality: Left;    has Rectal cancer s/p TAC, IPAA, loop ileostomy on 12/14/11, ostomy revision 12/2012, ostomy takedown 04/24/2013; Thyroid mass; Hx of radiation therapy; Protein-calorie malnutrition, severe ([place]); Diarrhea; Hemorrhoids, external; and Anal pain on her problem list.    is  allergic to paregoric.    Medication List       This list is accurate as of: 09/23/15 11:59 PM.  Always use your most recent med list.               B-complex with vitamin C tablet  Take 1 tablet by mouth daily.     diphenoxylate-atropine 2.5-0.025 MG tablet  Commonly known as:  LOMOTIL  TAKE TWO TABLETS FOUR TIMES DAILY     hydrocortisone cream-nystatin cream-zinc oxide  TOP TO AFFECTED AREA TID     loperamide 2 MG capsule  Commonly known as:  IMODIUM  Two capsules QID for diarrhea.     megestrol 40 MG/ML suspension  Commonly known as:  MEGACE  TAKE TWO TEASPOONSFUL DAILY     multivitamin with minerals Tabs tablet  Take 1 tablet by mouth daily.     omeprazole 20 MG capsule  Commonly known as:  PRILOSEC  Take 20 mg by mouth daily.     OVER THE COUNTER MEDICATION  Take 1 drop by mouth daily as needed (gas).     PROBIOTIC DAILY PO  Take by mouth.         PHYSICAL EXAMINATION  Oncology Vitals 09/23/2015 03/19/2015  Height 145 cm 145 cm  Weight 43.228 kg 41.459 kg  Weight (lbs) 95 lbs 5 oz 91 lbs 6 oz  BMI (kg/m2) 20.62 kg/m2 19.78 kg/m2  Temp 98.3 97.7  Pulse 60 58  Resp 18 18  Resp (Historical as of 04/25/12) - -  SpO2 100 100  BSA (m2) 1.32 m2 1.29 m2   BP Readings from Last 2 Encounters:  09/23/15 114/74  03/19/15 107/68    Physical Exam  Constitutional: She is oriented to person, place, and time and well-developed, well-nourished, and in no distress.  HENT:  Head: Normocephalic and atraumatic.  Mouth/Throat: Oropharynx is clear and moist.  Eyes: Conjunctivae and EOM are normal. Pupils are equal, round, and reactive to light. Right eye exhibits no discharge. Left eye exhibits no discharge. No scleral icterus.  Neck: Normal range of motion. Neck supple. No JVD present. No tracheal deviation present. No thyromegaly present.  Cardiovascular: Normal rate, regular rhythm, normal heart sounds and intact distal pulses.   Pulmonary/Chest: Effort normal  and breath sounds normal. No respiratory distress. She has no wheezes. She has no rales. She exhibits no tenderness.  Abdominal: Soft. Bowel sounds are normal. She exhibits no distension and no mass. There is no tenderness. There is no rebound and no guarding.  Musculoskeletal: Normal range of motion. She exhibits no edema or tenderness.  Lymphadenopathy:    She has no cervical adenopathy.  Neurological: She is alert and oriented to person, place, and time. Gait normal.  Skin: Skin is warm and dry. No rash noted. No erythema. No pallor.  Psychiatric: Affect normal.  Nursing note and vitals reviewed.   LABORATORY DATA:. Appointment on 09/23/2015  Component Date Value Ref Range Status  . CEA 09/23/2015 <0.5  0.0 - 5.0 ng/mL Final     RADIOGRAPHIC STUDIES: No results  found.  ASSESSMENT/PLAN:    Rectal cancer s/p TAC, IPAA, loop ileostomy on 12/14/11, ostomy revision 12/2012, ostomy takedown 04/24/2013 Patient underwent a loop ileostomy in 2013; and an ostomy takedown in 2014.  Patient presents to the [facility] today for her six-month follow-up.  She states she's been doing very well recently; and denies any issues with either fatigue or appetite.  She also denies any nausea, vomiting, or abdominal discomfort.  She does report chronic issues with diarrhea; and takes Imodium on an as-needed basis.  She denies any recent fevers or chills.  Patient states that she found a right breast mass this fall; and underwent a right breast mammogram in biopsy on 07/19/2015.  Biopsy was negative.  Patient is recommended to have a repeat diagnostic mammogram on 01/18/2016.  CEA obtained today is still pending results.  Patient is scheduled to return in approximate 6 months for labs which will include a CBC with differential, a complete metabolic panel, and a CEA; and a follow-up visit with Dr. Benay Spice.  Patient knows to call in the interim with any new worries or concerns.  Diarrhea Patient reports  chronic diarrhea since her initial cancer surgery.  She states she has between one and 3 loose bowel movements per day.  She takes Imodium on an as-needed basis.  Patient stated understanding of all instructions; and was in agreement with this plan of care. The patient knows to call the clinic with any problems, questions or concerns.   This was a shared visit with Dr. Benay Spice today.  Total time spent with patient was 25 minutes;  with greater than 75 percent of that time spent in face to face counseling regarding patient's symptoms,  and coordination of care and follow up.  Disclaimer:This dictation was prepared with Dragon/digital dictation along with [employer]. Any transcriptional errors that result from this process are unintentional.  Drue Second, NP 09/27/2015   This was a shared visit with Drue Second. Ms. Burgert remains in clinical remission from rectal cancer. We will follow-up on the CEA from today. She will return for an office visit in 6 months.  Julieanne Manson, M.D.

## 2015-09-28 ENCOUNTER — Telehealth: Payer: Self-pay | Admitting: *Deleted

## 2015-09-28 NOTE — Telephone Encounter (Signed)
-----   Message from Ladell Pier, MD sent at 09/24/2015  5:02 PM EST ----- Please call daughter, cea is normal

## 2015-09-28 NOTE — Telephone Encounter (Signed)
Per Dr. Benay Spice; notified pt's husband that cea is normal.  Husband verbalized understanding.

## 2015-09-29 ENCOUNTER — Telehealth: Payer: Self-pay | Admitting: Oncology

## 2015-09-29 NOTE — Telephone Encounter (Signed)
Called patient via [employer] re appointments for Mammo 4/25 and lab/BS 6/29. Patient placed a call to her husband and asked that we speak with him re appointments. Appointments were given to patient's husband with assistance from interpreter.

## 2015-11-08 ENCOUNTER — Encounter: Payer: Self-pay | Admitting: Gastroenterology

## 2015-12-23 ENCOUNTER — Encounter: Payer: Self-pay | Admitting: Gastroenterology

## 2015-12-23 ENCOUNTER — Ambulatory Visit (INDEPENDENT_AMBULATORY_CARE_PROVIDER_SITE_OTHER): Payer: Medicare Other | Admitting: Gastroenterology

## 2015-12-23 ENCOUNTER — Other Ambulatory Visit: Payer: Self-pay

## 2015-12-23 VITALS — BP 104/69 | HR 54 | Temp 97.6°F | Ht <= 58 in | Wt 93.8 lb

## 2015-12-23 DIAGNOSIS — C20 Malignant neoplasm of rectum: Secondary | ICD-10-CM

## 2015-12-23 DIAGNOSIS — R197 Diarrhea, unspecified: Secondary | ICD-10-CM

## 2015-12-23 DIAGNOSIS — K909 Intestinal malabsorption, unspecified: Secondary | ICD-10-CM | POA: Diagnosis not present

## 2015-12-23 DIAGNOSIS — E43 Unspecified severe protein-calorie malnutrition: Secondary | ICD-10-CM

## 2015-12-23 DIAGNOSIS — K6289 Other specified diseases of anus and rectum: Secondary | ICD-10-CM | POA: Diagnosis not present

## 2015-12-23 MED ORDER — OMEPRAZOLE 20 MG PO CPDR: 20.0000 mg | DELAYED_RELEASE_CAPSULE | Freq: Every day | ORAL | Status: AC

## 2015-12-23 MED ORDER — DIPHENOXYLATE-ATROPINE 2.5-0.025 MG PO TABS: ORAL_TABLET | ORAL | Status: DC

## 2015-12-23 MED ORDER — LOPERAMIDE HCL 2 MG PO CAPS: ORAL_CAPSULE | ORAL | Status: DC

## 2015-12-23 NOTE — Progress Notes (Signed)
ON RECALL  °

## 2015-12-23 NOTE — Assessment & Plan Note (Signed)
SYMPTOMS FAIRLY WELL CONTROLLED.  CONTINUE LOMOTIL AND IMODIUM. FOLLOW UP IN 1 YEAR.

## 2015-12-23 NOTE — Progress Notes (Signed)
cc'ed to pcp °

## 2015-12-23 NOTE — Assessment & Plan Note (Signed)
NO WARNING SIGNS/SYMPTOMS   ANOSCOPY WITH TAP WATER ENEMA IN PREOP AREA ON APR 28 @0830 . DISCUSSED PROCEDURE, BENEFITS, AND RISKS OF PROCEDURE. FOLLOW UP IN 1 YEAR.

## 2015-12-23 NOTE — Assessment & Plan Note (Signed)
WEIGHT STABLE FOR 3 YEARS.  CONTINUE TO MONITOR SYMPTOMS.

## 2015-12-23 NOTE — Assessment & Plan Note (Signed)
SYMPTOMS FAIRLY WELL CONTROLLED.  CONTINUE TO MONITOR SYMPTOMS. 

## 2015-12-23 NOTE — Patient Instructions (Signed)
Use Prilosec 30 minutes prior to your first meal TO PREVENT HEARTBURN OR USE AS NEEDED.  CONTINUE LOMOTIL AND IMODIUM. YOU HAVE REFILLS FOR ONE YEAR.  ANOSCOPY WITH TAP WATER ENEMA IN PREOP AREA ON APR 28 @0830 .  FOLLOW UP IN 1 YEAR.

## 2015-12-23 NOTE — Progress Notes (Signed)
Subjective:    Patient ID: Renee Nicholson, female    DOB: [birth year], 50 y.o.   MRN: [id number]  VYAS,DHRUV B., MD  HPI 4-6 stools a day. USING IMODIUM 3X/DAY(#5-6, rare #7). NO ACCIDENTS. ICE CREAM MAY MAKE WATERY STOOL. LACTOSE FREE MILK AND BOOST. WEIGHT: STABLE 93 LBS SINCE MAR 2016. DOES A LOT OF RUNNING AROUND. Appetite: good. RARE HEARTBURN: < 3X/WEEK. HAS BURINING IN BOTTOM IF EATS SPICY FOODS.  PT DENIES FEVER, CHILLS, HEMATOCHEZIA, HEMATEMESIS, nausea, vomiting, melena, CHEST PAIN, SHORTNESS OF BREATH,  CHANGE IN BOWEL IN HABITS, constipation, abdominal pain, OR problems swallowing.   Past Medical History  Diagnosis Date  . Anemia   . Diarrhea   . Bright red rectal bleeding 09/13/2011  . History of chemotherapy     completed 09/2011   . Hx of radiation therapy 08/29/11 to 10/09/11    rectum  . Ileostomy in place [facility])   . Cancer ([place])   . Rectal cancer ([place])   . Hx of tracheostomy ([place])     12/2012    Past Surgical History  Procedure Laterality Date  . Tubal ligation    . Carpal tunnel release    . Colonoscopy  07/26/2011    Procedure: COLONOSCOPY;  Surgeon: Dorothyann Peng, MD;  [facility];  Service: Endoscopy;  Laterality: N/A;  10:40  . Esophagogastroduodenoscopy  07/26/2011    SLF: 1. sessile polyp, multiple 2 internal hemorrhoids 3. rectal mass  . Esophagogastroduodenoscopy  12/27/2011    Procedure: ESOPHAGOGASTRODUODENOSCOPY (EGD);  Surgeon: Lear Ng, MD;  Location: [facility];  Service: Endoscopy;  Laterality: N/A;  . Laparoscopic colon resection  12/14/11    diverting ileostomy  . Portacath placement  02/21/2012    Procedure: INSERTION PORT-A-CATH;  Surgeon: Stark Klein, MD;  Location: [place];  Service: General;  Laterality: N/A;  . Evaluation under anesthesia with anal fissurotomy N/A 11/06/2012    Procedure: Exam Under Anesthesia , Anal Dilation;  Surgeon: Stark Klein, MD;  Location: [facility];  Service: General;  Laterality: N/A;  Exam  Under Anesthesia , Anal Dilation  . Laparotomy N/A 01/03/2013    Procedure: EXPLORATORY LAPAROTOMY WITH LYSIS OF ADHESIONS, revision of ileostomy;  Surgeon: Stark Klein, MD;  Location: [place];  Service: General;  Laterality: N/A;  . Ileostomy closure N/A 04/24/2013    Procedure: ILEOSTOMY TAKEDOWN;  Surgeon: Stark Klein, MD;  [facility];  Service: General;  Laterality: N/A;  . I&d extremity Left 05/31/2013    Procedure: IRRIGATION AND DEBRIDEMENT EXTREMITY;  Surgeon: Linna Hoff, MD;  Location: [place];  Service: Orthopedics;  Laterality: Left;  . Open reduction internal fixation (orif) metacarpal Left 05/31/2013    Procedure: OPEN REDUCTION INTERNAL FIXATION (ORIF) METACARPAL;  Surgeon: Linna Hoff, MD;  Location: [place];  Service: Orthopedics;  Laterality: Left;  . Port-a-cath removal Left 04/29/2014    Procedure: REMOVAL PORT-A-CATH;  Surgeon: Stark Klein, MD;  Location: [place];  Service: General;  Laterality: Left;   Past Surgical History  Procedure Laterality Date  . Tubal ligation    . Carpal tunnel release    . Colonoscopy  07/26/2011    Procedure: COLONOSCOPY;  Surgeon: Dorothyann Peng, MD;  [facility];  Service: Endoscopy;  Laterality: N/A;  10:40  . Esophagogastroduodenoscopy  07/26/2011    SLF: 1. sessile polyp, multiple 2 internal hemorrhoids 3. rectal mass  . Esophagogastroduodenoscopy  12/27/2011    Procedure: ESOPHAGOGASTRODUODENOSCOPY (EGD);  Surgeon: Evette Doffing  Aloha Gell, MD;  Location: [facility];  Service: Endoscopy;  Laterality: N/A;  . Laparoscopic colon resection  12/14/11    diverting ileostomy  . Portacath placement  02/21/2012    Procedure: INSERTION PORT-A-CATH;  Surgeon: Stark Klein, MD;  Location: [place];  Service: General;  Laterality: N/A;  . Evaluation under anesthesia with anal fissurotomy N/A 11/06/2012    Procedure: Exam Under Anesthesia , Anal Dilation;  Surgeon: Stark Klein, MD;  [facility];  Service: General;   Laterality: N/A;  Exam Under Anesthesia , Anal Dilation  . Laparotomy N/A 01/03/2013    Procedure: EXPLORATORY LAPAROTOMY WITH LYSIS OF ADHESIONS, revision of ileostomy;  Surgeon: Stark Klein, MD;  Location: [place];  Service: General;  Laterality: N/A;  . Ileostomy closure N/A 04/24/2013    Procedure: ILEOSTOMY TAKEDOWN;  Surgeon: Stark Klein, MD;  [facility];  Service: General;  Laterality: N/A;  . I&d extremity Left 05/31/2013    Procedure: IRRIGATION AND DEBRIDEMENT EXTREMITY;  Surgeon: Linna Hoff, MD;  Location: [place];  Service: Orthopedics;  Laterality: Left;  . Open reduction internal fixation (orif) metacarpal Left 05/31/2013    Procedure: OPEN REDUCTION INTERNAL FIXATION (ORIF) METACARPAL;  Surgeon: Linna Hoff, MD;  Location: [place];  Service: Orthopedics;  Laterality: Left;  . Port-a-cath removal Left 04/29/2014    Procedure: REMOVAL PORT-A-CATH;  Surgeon: Stark Klein, MD;  Location: [place];  Service: General;  Laterality: Left;   Allergies  Allergen Reactions  . Paregoric     CHEST PAIN   Current Outpatient Prescriptions  Medication Sig Dispense Refill  . B Complex-C (B-COMPLEX WITH VITAMIN C) tablet Take 1 tablet by mouth daily.    . diphenoxylate-atropine (LOMOTIL) 2.5-0.025 MG tablet TAKE TWO TABLETS FOUR TIMES DAILY    . hydrocortisone cream-nystatin cream-zinc oxide TOP TO AFFECTED AREA TID    . loperamide (IMODIUM) 2 MG capsule Two capsules QID for diarrhea.    . megestrol (MEGACE) 40 MG/ML suspension TAKE TWO TEASPOONSFUL DAILY    . Multiple Vitamin (MULTIVITAMIN WITH MINERALS) TABS Take 1 tablet by mouth daily.    Marland Kitchen omeprazole (PRILOSEC) 20 MG capsule Take 20 mg by mouth daily.    Marland Kitchen OVER THE COUNTER MEDICATION Take 1 drop by mouth daily as needed (gas).    . Probiotic Product (PROBIOTIC DAILY PO) Take by mouth.      Review of Systems PER HPI OTHERWISE ALL SYSTEMS ARE NEGATIVE.    Objective:   Physical Exam  Constitutional: She is oriented  to person, place, and time. She appears well-developed and well-nourished. No distress.  HENT:  Head: Normocephalic and atraumatic.  Mouth/Throat: Oropharynx is clear and moist. No oropharyngeal exudate.  Eyes: Pupils are equal, round, and reactive to light. No scleral icterus.  Neck: Normal range of motion. Neck supple.  Cardiovascular: Normal rate, regular rhythm and normal heart sounds.   Pulmonary/Chest: Effort normal and breath sounds normal. No respiratory distress.  Abdominal: Soft. Bowel sounds are normal. She exhibits no distension. There is no tenderness.  Musculoskeletal: She exhibits no edema.  Lymphadenopathy:    She has no cervical adenopathy.  Neurological: She is alert and oriented to person, place, and time.  NO FOCAL DEFICITS  Psychiatric: She has a normal mood and affect.  Vitals reviewed.     Assessment & Plan:

## 2016-01-18 ENCOUNTER — Ambulatory Visit
Admission: RE | Admit: 2016-01-18 | Discharge: 2016-01-18 | Disposition: A | Discharge status: Home / Self Care | Payer: Medicare Other | Source: Ambulatory Visit | Attending: Nurse Practitioner | Admitting: Nurse Practitioner

## 2016-01-18 ENCOUNTER — Other Ambulatory Visit: Payer: Self-pay | Admitting: Nurse Practitioner

## 2016-01-18 DIAGNOSIS — C20 Malignant neoplasm of rectum: Secondary | ICD-10-CM

## 2016-01-21 ENCOUNTER — Encounter (HOSPITAL_COMMUNITY): Payer: Self-pay | Admitting: *Deleted

## 2016-01-21 ENCOUNTER — Ambulatory Visit (HOSPITAL_COMMUNITY)
Admission: RE | Admit: 2016-01-21 | Discharge: 2016-01-21 | Disposition: A | Discharge status: Home / Self Care | Payer: Medicare Other | Source: Ambulatory Visit | Attending: Gastroenterology | Admitting: Gastroenterology

## 2016-01-21 ENCOUNTER — Encounter (HOSPITAL_COMMUNITY)
Admission: RE | Disposition: A | Discharge status: Home / Self Care | Payer: Self-pay | Source: Ambulatory Visit | Attending: Gastroenterology

## 2016-01-21 DIAGNOSIS — K921 Melena: Secondary | ICD-10-CM | POA: Diagnosis not present

## 2016-01-21 DIAGNOSIS — K624 Stenosis of anus and rectum: Secondary | ICD-10-CM | POA: Diagnosis not present

## 2016-01-21 DIAGNOSIS — Z9221 Personal history of antineoplastic chemotherapy: Secondary | ICD-10-CM | POA: Diagnosis not present

## 2016-01-21 DIAGNOSIS — Z85048 Personal history of other malignant neoplasm of rectum, rectosigmoid junction, and anus: Secondary | ICD-10-CM | POA: Insufficient documentation

## 2016-01-21 DIAGNOSIS — C20 Malignant neoplasm of rectum: Secondary | ICD-10-CM

## 2016-01-21 DIAGNOSIS — Z79899 Other long term (current) drug therapy: Secondary | ICD-10-CM | POA: Insufficient documentation

## 2016-01-21 HISTORY — PX: FLEXIBLE SIGMOIDOSCOPY: SHX5431

## 2016-01-21 SURGERY — SIGMOIDOSCOPY, FLEXIBLE
Anesthesia: Moderate Sedation

## 2016-01-21 MED ORDER — LIDOCAINE-HYDROCORTISONE ACE 3-2.5 % RE KIT: PACK | RECTAL | Status: DC

## 2016-01-21 MED ORDER — SODIUM CHLORIDE 0.9 % IV SOLN
INTRAVENOUS | Status: DC
  Administered 2016-01-21: 1000 mL via INTRAVENOUS

## 2016-01-21 MED ORDER — STERILE WATER FOR IRRIGATION IR SOLN
Status: DC | PRN
  Administered 2016-01-21: 09:00:00

## 2016-01-21 MED ORDER — MIDAZOLAM HCL 5 MG/5ML IJ SOLN
INTRAMUSCULAR | Status: AC
  Filled 2016-01-21: qty 10

## 2016-01-21 MED ORDER — MEPERIDINE HCL 50 MG/ML IJ SOLN
INTRAMUSCULAR | Status: AC
  Filled 2016-01-21: qty 1

## 2016-01-21 MED ORDER — MEPERIDINE HCL 100 MG/ML IJ SOLN
INTRAMUSCULAR | Status: DC | PRN
  Administered 2016-01-21 (×2): 25 mg via INTRAVENOUS

## 2016-01-21 MED ORDER — MIDAZOLAM HCL 5 MG/5ML IJ SOLN
INTRAMUSCULAR | Status: DC | PRN
Start: 2016-01-21 — End: 2016-01-21
  Administered 2016-01-21 (×2): 1 mg via INTRAVENOUS
  Administered 2016-01-21: 2 mg via INTRAVENOUS
  Administered 2016-01-21: 1 mg via INTRAVENOUS

## 2016-01-21 NOTE — Interval H&P Note (Signed)
History and Physical Interval Note:  01/21/2016 8:16 AM  Renee Nicholson  has presented today for surgery, with the diagnosis of rectal cancer  The various methods of treatment have been discussed with the patient and family. After consideration of risks, benefits and other options for treatment, the patient has consented to  Procedure(s) with comments: FLEXIBLE SIGMOIDOSCOPY (N/A) - 830  as a surgical intervention .  The patient's history has been reviewed, patient examined, no change in status, stable for surgery.  I have reviewed the patient's chart and labs.  Questions were answered to the patient's satisfaction.     [employer]

## 2016-01-21 NOTE — Discharge Instructions (Signed)
Your anal canal is narrow. YOU MAY SEE A SMALL AMOUNT OF BLOOD WHEN THE STOOL PASSES. THE REST OF YOUR EXAM IS NORMAL.   DRINK WATER TO KEEP YOUR URINE LIGHT YELLOW.  FOLLOW A HIGH FIBER  DIET. AVOID ITEMS THAT CAUSE BLOATING. SEE INFO BELOW.  CONTINUE LOMOTIL AND IMODIUM TO CONTROL DIARRHEA.  USE APOTHECARY HEMORRHOID CREAM AS NEEDED FOR RECTAL PAIN OR BLEEDING.         ENDOSCOPY Care After Read the instructions outlined below and refer to this sheet in the next week. These discharge instructions provide you with general information on caring for yourself after you leave the hospital. While your treatment has been planned according to the most current medical practices available, unavoidable complications occasionally occur. If you have any problems or questions after discharge, call DR. Keyli Duross, [phone].  ACTIVITY  You may resume your regular activity, but move at a slower pace for the next 24 hours.   Take frequent rest periods for the next 24 hours.   Walking will help get rid of the air and reduce the bloated feeling in your belly (abdomen).   No driving for 24 hours (because of the medicine (anesthesia) used during the test).   You may shower.   Do not sign any important legal documents or operate any machinery for 24 hours (because of the anesthesia used during the test).    NUTRITION  Drink plenty of fluids.   You may resume your normal diet as instructed by your doctor.   Begin with a light meal and progress to your normal diet. Heavy or fried foods are harder to digest and may make you feel sick to your stomach (nauseated).   Avoid alcoholic beverages for 24 hours or as instructed.    MEDICATIONS  You may resume your normal medications.   WHAT YOU CAN EXPECT TODAY  Some feelings of bloating in the abdomen.   Passage of more gas than usual.   Spotting of blood in your stool or on the toilet paper  .  IF YOU HAD POLYPS REMOVED DURING THE  ENDOSCOPY:  Eat a soft diet IF YOU HAVE NAUSEA, BLOATING, ABDOMINAL PAIN, OR VOMITING.    FINDING OUT THE RESULTS OF YOUR TEST Not all test results are available during your visit. DR. Oneida Alar WILL CALL YOU WITHIN 14 DAYS OF YOUR PROCEDUE WITH YOUR RESULTS. Do not assume everything is normal if you have not heard from DR. Adenike Shidler, CALL HER OFFICE AT [phone].  SEEK IMMEDIATE MEDICAL ATTENTION AND CALL THE OFFICE: [phone] IF:  You have more than a spotting of blood in your stool.   Your belly is swollen (abdominal distention).   You are nauseated or vomiting.   You have a temperature over 101F.    High-Fiber Diet A high-fiber diet changes your normal diet to include more whole grains, legumes, fruits, and vegetables. Changes in the diet involve replacing refined carbohydrates with unrefined foods. The calorie level of the diet is essentially unchanged. The Dietary Reference Intake (recommended amount) for adult males is 38 grams per day. For adult females, it is 25 grams per day. Pregnant and lactating women should consume 28 grams of fiber per day. Fiber is the intact part of a plant that is not broken down during digestion. Functional fiber is fiber that has been isolated from the plant to provide a beneficial effect in the body. PURPOSE  Increase stool bulk.   Ease and regulate bowel movements.   Lower cholesterol.  REDUCE  RISK OF COLON CANCER  INDICATIONS THAT YOU NEED MORE FIBER  Constipation and hemorrhoids.   Uncomplicated diverticulosis (intestine condition) and irritable bowel syndrome.   Weight management.   As a protective measure against hardening of the arteries (atherosclerosis), diabetes, and cancer.   GUIDELINES FOR INCREASING FIBER IN THE DIET  Start adding fiber to the diet slowly. A gradual increase of about 5 more grams (2 slices of whole-wheat bread, 2 servings of most fruits or vegetables, or 1 bowl of high-fiber cereal) per day is best. Too  rapid an increase in fiber may result in constipation, flatulence, and bloating.   Drink enough water and fluids to keep your urine clear or pale yellow. Water, juice, or caffeine-free drinks are recommended. Not drinking enough fluid may cause constipation.   Eat a variety of high-fiber foods rather than one type of fiber.   Try to increase your intake of fiber through using high-fiber foods rather than fiber pills or supplements that contain small amounts of fiber.   The goal is to change the types of food eaten. Do not supplement your present diet with high-fiber foods, but replace foods in your present diet.   INCLUDE A VARIETY OF FIBER SOURCES  Replace refined and processed grains with whole grains, canned fruits with fresh fruits, and incorporate other fiber sources. White rice, white breads, and most bakery goods contain little or no fiber.   Brown whole-grain rice, buckwheat oats, and many fruits and vegetables are all good sources of fiber. These include: broccoli, Brussels sprouts, cabbage, cauliflower, beets, sweet potatoes, white potatoes (skin on), carrots, tomatoes, eggplant, squash, berries, fresh fruits, and dried fruits.   Cereals appear to be the richest source of fiber. Cereal fiber is found in whole grains and bran. Bran is the fiber-rich outer coat of cereal grain, which is largely removed in refining. In whole-grain cereals, the bran remains. In breakfast cereals, the largest amount of fiber is found in those with "bran" in their names. The fiber content is sometimes indicated on the label.   You may need to include additional fruits and vegetables each day.   In baking, for 1 cup white flour, you may use the following substitutions:   1 cup whole-wheat flour minus 2 tablespoons.   1/2 cup white flour plus 1/2 cup whole-wheat flour.   Pain with bowel movements

## 2016-01-21 NOTE — Op Note (Signed)
[facility] Patient Name: Renee Nicholson Procedure Date: 01/21/2016 8:51 AM MRN: [id number] Date of Birth: 1966/01/14 Attending MD: Barney Drain , MD CSN: [id number] Age: 50 Admit Type: Outpatient Procedure:                Flexible Sigmoidoscopy Indications:              Hematochezia Providers:                Barney Drain, MD, Rosina Lowenstein, RN, Georgeann Oppenheim,                            Technician Referring MD:             [name] Medicines:                Meperidine 50 mg IV, Midazolam 5 mg IV Complications:            No immediate complications. Estimated Blood Loss:     Estimated blood loss was minimal. Procedure:                Pre-Anesthesia Assessment:                           - Prior to the procedure, a History and Physical                            was performed, and patient medications and                            allergies were reviewed. The patient's tolerance of                            previous anesthesia was also reviewed. The risks                            and benefits of the procedure and the sedation                            options and risks were discussed with the patient.                            All questions were answered, and informed consent                            was obtained. Prior Anticoagulants: The patient has                            taken no previous anticoagulant or antiplatelet                            agents. ASA Grade Assessment: I - A normal, healthy                            patient. After reviewing the risks and benefits,  the patient was deemed in satisfactory condition to                            undergo the procedure. After obtaining informed                            consent, the scope was passed under direct vision.                            The Flexible sigmoidoscope was introduced through                            the anus and advanced to the 20 cm from the anal                             verge(ileum). The flexible sigmoidoscopy was                            somewhat difficult due to excessive bleeding. The                            quality of the bowel preparation was good. Scope In: 9:01:48 AM Scope Out: 9:03:21 AM Total Procedure Duration: 0 hours 1 minute 33 seconds  Findings:      The digital rectal exam findings include anal stricture. Pertinent       negatives include no anal lesion or abnormality was detected.      The exam was otherwise without abnormality. 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                           - No specimens collected. Moderate Sedation:      Moderate (conscious) sedation was administered by the endoscopy nurse       and supervised by the endoscopist. The following parameters were       monitored: oxygen saturation, heart rate, blood pressure, and response       to care. Total physician intraservice time was 15 minutes. Recommendation:           - Patient has a contact number available for                            emergencies. The signs and symptoms of potential                            delayed complications were discussed with the                            patient. Return to normal activities tomorrow.                            Written discharge instructions were provided to the  patient.                           DRINK WATER TO KEEP YOUR URINE LIGHT YELLOW.                           FOLLOW A HIGH FIBER DIET. AVOID ITEMS THAT CAUSE                            BLOATING.                           CONTINUE LOMOTIL AND IMODIUM TO CONTROL DIARRHEA.                           USE APOTHECARY HEMORRHOID CREAM(w/o hydrocortisone)                            AS NEEDED FOR RECTAL PAIN OR BLEEDING.                           - Resume previous diet.                           - Continue present medications.                           - Return to my office in 1  year. Procedure Code(s):        --- Professional ---                           [phone], Sigmoidoscopy, flexible; diagnostic,                            including collection of specimen(s) by brushing or                            washing, when performed (separate procedure)                           99152, Moderate sedation services provided by the                            same physician or other qualified health care                            professional performing the diagnostic or                            therapeutic service that the sedation supports,                            requiring the presence of an independent trained                            observer to assist in the monitoring of the  patient's level of consciousness and physiological                            status; initial 15 minutes of intraservice time,                            patient age 79 years or older Diagnosis Code(s):        --- Professional ---                           K62.4, Stenosis of anus and rectum                           K92.1, Melena (includes Hematochezia) CPT copyright 2016 [employer]. All rights reserved. The codes documented in this report are preliminary and upon coder review may  be revised to meet current compliance requirements. Barney Drain, MD Barney Drain, MD 01/21/2016 2:06:31 PM This report has been signed electronically. Number of Addenda: 0

## 2016-01-21 NOTE — H&P (View-Only) (Signed)
Subjective:    Patient ID: Renee Nicholson, female    DOB: 05/12/1966, 50 y.o.   MRN: [id number]  VYAS,DHRUV B., MD  HPI 4-6 stools a day. USING IMODIUM 3X/DAY(#5-6, rare #7). NO ACCIDENTS. ICE CREAM MAY MAKE WATERY STOOL. LACTOSE FREE MILK AND BOOST. WEIGHT: STABLE 93 LBS SINCE MAR 2016. DOES A LOT OF RUNNING AROUND. Appetite: good. RARE HEARTBURN: < 3X/WEEK. HAS BURINING IN BOTTOM IF EATS SPICY FOODS.  PT DENIES FEVER, CHILLS, HEMATOCHEZIA, HEMATEMESIS, nausea, vomiting, melena, CHEST PAIN, SHORTNESS OF BREATH,  CHANGE IN BOWEL IN HABITS, constipation, abdominal pain, OR problems swallowing.   Past Medical History  Diagnosis Date  . Anemia   . Diarrhea   . Bright red rectal bleeding 09/13/2011  . History of chemotherapy     completed 09/2011   . Hx of radiation therapy 08/29/11 to 10/09/11    rectum  . Ileostomy in place [facility])   . Cancer ([place])   . Rectal cancer ([place])   . Hx of tracheostomy ([place])     12/2012    Past Surgical History  Procedure Laterality Date  . Tubal ligation    . Carpal tunnel release    . Colonoscopy  07/26/2011    Procedure: COLONOSCOPY;  Surgeon: Dorothyann Peng, MD;  [facility];  Service: Endoscopy;  Laterality: N/A;  10:40  . Esophagogastroduodenoscopy  07/26/2011    SLF: 1. sessile polyp, multiple 2 internal hemorrhoids 3. rectal mass  . Esophagogastroduodenoscopy  12/27/2011    Procedure: ESOPHAGOGASTRODUODENOSCOPY (EGD);  Surgeon: Lear Ng, MD;  [facility];  Service: Endoscopy;  Laterality: N/A;  . Laparoscopic colon resection  12/14/11    diverting ileostomy  . Portacath placement  02/21/2012    Procedure: INSERTION PORT-A-CATH;  Surgeon: Stark Klein, MD;  Location: [place];  Service: General;  Laterality: N/A;  . Evaluation under anesthesia with anal fissurotomy N/A 11/06/2012    Procedure: Exam Under Anesthesia , Anal Dilation;  Surgeon: Stark Klein, MD;  [facility];  Service: General;  Laterality: N/A;  Exam  Under Anesthesia , Anal Dilation  . Laparotomy N/A 01/03/2013    Procedure: EXPLORATORY LAPAROTOMY WITH LYSIS OF ADHESIONS, revision of ileostomy;  Surgeon: Stark Klein, MD;  Location: [place];  Service: General;  Laterality: N/A;  . Ileostomy closure N/A 04/24/2013    Procedure: ILEOSTOMY TAKEDOWN;  Surgeon: Stark Klein, MD;  [facility];  Service: General;  Laterality: N/A;  . I&d extremity Left 05/31/2013    Procedure: IRRIGATION AND DEBRIDEMENT EXTREMITY;  Surgeon: Linna Hoff, MD;  Location: [place];  Service: Orthopedics;  Laterality: Left;  . Open reduction internal fixation (orif) metacarpal Left 05/31/2013    Procedure: OPEN REDUCTION INTERNAL FIXATION (ORIF) METACARPAL;  Surgeon: Linna Hoff, MD;  Location: [place];  Service: Orthopedics;  Laterality: Left;  . Port-a-cath removal Left 04/29/2014    Procedure: REMOVAL PORT-A-CATH;  Surgeon: Stark Klein, MD;  Location: [place];  Service: General;  Laterality: Left;   Past Surgical History  Procedure Laterality Date  . Tubal ligation    . Carpal tunnel release    . Colonoscopy  07/26/2011    Procedure: COLONOSCOPY;  Surgeon: Dorothyann Peng, MD;  [facility];  Service: Endoscopy;  Laterality: N/A;  10:40  . Esophagogastroduodenoscopy  07/26/2011    SLF: 1. sessile polyp, multiple 2 internal hemorrhoids 3. rectal mass  . Esophagogastroduodenoscopy  12/27/2011    Procedure: ESOPHAGOGASTRODUODENOSCOPY (EGD);  Surgeon: Evette Doffing  Aloha Gell, MD;  Location[facility];  Service: Endoscopy;  Laterality: N/A;  . Laparoscopic colon resection  12/14/11    diverting ileostomy  . Portacath placement  02/21/2012    Procedure: INSERTION PORT-A-CATH;  Surgeon: Stark Klein, MD;  Location: [place];  Service: General;  Laterality: N/A;  . Evaluation under anesthesia with anal fissurotomy N/A 11/06/2012    Procedure: Exam Under Anesthesia , Anal Dilation;  Surgeon: Stark Klein, MD;  [facility];  Service: General;   Laterality: N/A;  Exam Under Anesthesia , Anal Dilation  . Laparotomy N/A 01/03/2013    Procedure: EXPLORATORY LAPAROTOMY WITH LYSIS OF ADHESIONS, revision of ileostomy;  Surgeon: Stark Klein, MD;  Location: [place];  Service: General;  Laterality: N/A;  . Ileostomy closure N/A 04/24/2013    Procedure: ILEOSTOMY TAKEDOWN;  Surgeon: Stark Klein, MD;  [facility];  Service: General;  Laterality: N/A;  . I&d extremity Left 05/31/2013    Procedure: IRRIGATION AND DEBRIDEMENT EXTREMITY;  Surgeon: Linna Hoff, MD;  Location: [place];  Service: Orthopedics;  Laterality: Left;  . Open reduction internal fixation (orif) metacarpal Left 05/31/2013    Procedure: OPEN REDUCTION INTERNAL FIXATION (ORIF) METACARPAL;  Surgeon: Linna Hoff, MD;  Location: [place];  Service: Orthopedics;  Laterality: Left;  . Port-a-cath removal Left 04/29/2014    Procedure: REMOVAL PORT-A-CATH;  Surgeon: Stark Klein, MD;  Location: [place];  Service: General;  Laterality: Left;   Allergies  Allergen Reactions  . Paregoric     CHEST PAIN   Current Outpatient Prescriptions  Medication Sig Dispense Refill  . B Complex-C (B-COMPLEX WITH VITAMIN C) tablet Take 1 tablet by mouth daily.    . diphenoxylate-atropine (LOMOTIL) 2.5-0.025 MG tablet TAKE TWO TABLETS FOUR TIMES DAILY    . hydrocortisone cream-nystatin cream-zinc oxide TOP TO AFFECTED AREA TID    . loperamide (IMODIUM) 2 MG capsule Two capsules QID for diarrhea.    . megestrol (MEGACE) 40 MG/ML suspension TAKE TWO TEASPOONSFUL DAILY    . Multiple Vitamin (MULTIVITAMIN WITH MINERALS) TABS Take 1 tablet by mouth daily.    Marland Kitchen omeprazole (PRILOSEC) 20 MG capsule Take 20 mg by mouth daily.    Marland Kitchen OVER THE COUNTER MEDICATION Take 1 drop by mouth daily as needed (gas).    . Probiotic Product (PROBIOTIC DAILY PO) Take by mouth.      Review of Systems PER HPI OTHERWISE ALL SYSTEMS ARE NEGATIVE.    Objective:   Physical Exam  Constitutional: She is oriented  to person, place, and time. She appears well-developed and well-nourished. No distress.  HENT:  Head: Normocephalic and atraumatic.  Mouth/Throat: Oropharynx is clear and moist. No oropharyngeal exudate.  Eyes: Pupils are equal, round, and reactive to light. No scleral icterus.  Neck: Normal range of motion. Neck supple.  Cardiovascular: Normal rate, regular rhythm and normal heart sounds.   Pulmonary/Chest: Effort normal and breath sounds normal. No respiratory distress.  Abdominal: Soft. Bowel sounds are normal. She exhibits no distension. There is no tenderness.  Musculoskeletal: She exhibits no edema.  Lymphadenopathy:    She has no cervical adenopathy.  Neurological: She is alert and oriented to person, place, and time.  NO FOCAL DEFICITS  Psychiatric: She has a normal mood and affect.  Vitals reviewed.     Assessment & Plan:

## 2016-01-24 ENCOUNTER — Encounter (HOSPITAL_COMMUNITY): Payer: Self-pay | Admitting: Gastroenterology

## 2016-03-23 ENCOUNTER — Other Ambulatory Visit (HOSPITAL_BASED_OUTPATIENT_CLINIC_OR_DEPARTMENT_OTHER): Payer: Medicare Other

## 2016-03-23 ENCOUNTER — Ambulatory Visit (HOSPITAL_BASED_OUTPATIENT_CLINIC_OR_DEPARTMENT_OTHER): Payer: Medicare Other | Admitting: Oncology

## 2016-03-23 VITALS — BP 119/80 | HR 55 | Temp 97.7°F | Resp 18 | Ht <= 58 in | Wt 96.0 lb

## 2016-03-23 DIAGNOSIS — C20 Malignant neoplasm of rectum: Secondary | ICD-10-CM | POA: Diagnosis not present

## 2016-03-23 LAB — CBC WITH DIFFERENTIAL/PLATELET
BASO%: 0.5 % (ref 0.0–2.0)
Basophils Absolute: 0 10*3/uL (ref 0.0–0.1)
EOS%: 6.1 % (ref 0.0–7.0)
Eosinophils Absolute: 0.3 10*3/uL (ref 0.0–0.5)
HCT: 40.2 % (ref 34.8–46.6)
HGB: 13.3 g/dL (ref 11.6–15.9)
LYMPH%: 18.1 % (ref 14.0–49.7)
MCH: 29.1 pg (ref 25.1–34.0)
MCHC: 33.1 g/dL (ref 31.5–36.0)
MCV: 88 fL (ref 79.5–101.0)
MONO#: 0.5 10*3/uL (ref 0.1–0.9)
MONO%: 10.1 % (ref 0.0–14.0)
NEUT%: 65.2 % (ref 38.4–76.8)
NEUTROS ABS: 3.5 10*3/uL (ref 1.5–6.5)
PLATELETS: 216 10*3/uL (ref 145–400)
RBC: 4.57 10*6/uL (ref 3.70–5.45)
RDW: 12.9 % (ref 11.2–14.5)
WBC: 5.4 10*3/uL (ref 3.9–10.3)
lymph#: 1 10*3/uL (ref 0.9–3.3)

## 2016-03-23 LAB — COMPREHENSIVE METABOLIC PANEL
ALBUMIN: 3.4 g/dL — AB (ref 3.5–5.0)
ALK PHOS: 81 U/L (ref 40–150)
ALT: 13 U/L (ref 0–55)
AST: 21 U/L (ref 5–34)
Anion Gap: 7 mEq/L (ref 3–11)
BUN: 21.4 mg/dL (ref 7.0–26.0)
CALCIUM: 9.3 mg/dL (ref 8.4–10.4)
CHLORIDE: 104 meq/L (ref 98–109)
CO2: 28 mEq/L (ref 22–29)
Creatinine: 0.7 mg/dL (ref 0.6–1.1)
Glucose: 88 mg/dl (ref 70–140)
POTASSIUM: 4.3 meq/L (ref 3.5–5.1)
Sodium: 139 mEq/L (ref 136–145)
Total Bilirubin: 0.35 mg/dL (ref 0.20–1.20)
Total Protein: 6.3 g/dL — ABNORMAL LOW (ref 6.4–8.3)
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
## 2016-03-24 LAB — CEA (PARALLEL TESTING): CEA: <0.5 ng/mL

## 2016-03-27 ENCOUNTER — Telehealth: Payer: Self-pay | Admitting: *Deleted

## 2016-03-27 NOTE — Telephone Encounter (Signed)
-----   Message from Ladell Pier, MD sent at 03/25/2016  8:53 PM EDT ----- Please call patient, cea is normal

## 2016-03-27 NOTE — Telephone Encounter (Signed)
Pt notified of normal CEA. She voiced understanding.

## 2016-06-07 ENCOUNTER — Ambulatory Visit (HOSPITAL_COMMUNITY)
Admission: RE | Admit: 2016-06-07 | Discharge: 2016-06-07 | Disposition: A | Discharge status: Home / Self Care | Payer: Medicare Other | Source: Ambulatory Visit | Attending: Gastroenterology | Admitting: Gastroenterology

## 2016-06-07 ENCOUNTER — Other Ambulatory Visit: Payer: Self-pay

## 2016-06-07 ENCOUNTER — Telehealth: Payer: Self-pay | Admitting: Gastroenterology

## 2016-06-07 DIAGNOSIS — I700 Atherosclerosis of aorta: Secondary | ICD-10-CM | POA: Insufficient documentation

## 2016-06-07 DIAGNOSIS — R1900 Intra-abdominal and pelvic swelling, mass and lump, unspecified site: Secondary | ICD-10-CM | POA: Diagnosis not present

## 2016-06-07 DIAGNOSIS — R109 Unspecified abdominal pain: Secondary | ICD-10-CM

## 2016-06-07 MED ORDER — IOPAMIDOL (ISOVUE-300) INJECTION 61%
100.0000 mL | Freq: Once | INTRAVENOUS | Status: AC | PRN
  Administered 2016-06-07: 100 mL via INTRAVENOUS

## 2016-06-07 MED ORDER — IOPAMIDOL (ISOVUE-300) INJECTION 61%
INTRAVENOUS | Status: AC
  Filled 2016-06-07: qty 30

## 2016-06-07 NOTE — Telephone Encounter (Signed)
I spoke with the pt's husband and he wants pt seen today. Said she has been having very bad abdominal cramps for the last 2-3 days.   She is having regular BM's, no rectal bleeding, no nausea or vomiting.  He then let me speak with the patient and she said " I am scared", you know what is wrong with me and " I am just scared".  I told her that I would inform Dr. Oneida Alar and see what she recommends.

## 2016-06-07 NOTE — Telephone Encounter (Signed)
PLEASE CALL PT. GO CT SCAN OF ABD/PELVIS W/ IV AND ORAL CONTRAST TODAY AND OPV AT 1130 AM TOMORROW.

## 2016-06-07 NOTE — Telephone Encounter (Signed)
Pt's husband called to say that patient needed to be seen by [facility] today. I offered him SF next available on 10/4 and he said, No, she needed to be checked today. I transferred call to DS

## 2016-06-07 NOTE — Telephone Encounter (Signed)
Pts husband is aware.  °

## 2016-06-07 NOTE — Telephone Encounter (Signed)
Called and spoke to pt's husband and informed to go to [facility] now for CT scan.

## 2016-06-08 ENCOUNTER — Ambulatory Visit (INDEPENDENT_AMBULATORY_CARE_PROVIDER_SITE_OTHER): Payer: Medicare Other | Admitting: Gastroenterology

## 2016-06-08 ENCOUNTER — Encounter: Payer: Self-pay | Admitting: Gastroenterology

## 2016-06-08 DIAGNOSIS — R197 Diarrhea, unspecified: Secondary | ICD-10-CM

## 2016-06-08 DIAGNOSIS — R1031 Right lower quadrant pain: Secondary | ICD-10-CM

## 2016-06-08 DIAGNOSIS — K909 Intestinal malabsorption, unspecified: Secondary | ICD-10-CM

## 2016-06-08 DIAGNOSIS — C20 Malignant neoplasm of rectum: Secondary | ICD-10-CM

## 2016-06-08 DIAGNOSIS — E43 Unspecified severe protein-calorie malnutrition: Secondary | ICD-10-CM | POA: Diagnosis not present

## 2016-06-08 DIAGNOSIS — R1032 Left lower quadrant pain: Secondary | ICD-10-CM

## 2016-06-08 NOTE — Progress Notes (Signed)
Subjective:    Patient ID: Renee Nicholson, female    DOB: 02-15-1966, 50 y.o.   MRN: [id number]  [name], MD  HPI Pain , gas, bloating FOR PAST 4-5 DAYS. NO CHANGE IN STOOL. NO NAUSEA, VOMITING, FEVER, OR CHILLS.HAVIGN 4-5 STOOLS A DAY. LOWER ABDOMINAL CRAMPING COMES AND GOES. MAY LAST UP TO 15-20 MINS. NO DYSURIA OR BLOOD IN URINE. NO HISTORY OF KIDNEY STONES. APPETITE DOWN FOR A WEEK.  PT DENIES FEVER, CHILLS, HEMATOCHEZIA, HEMATEMESIS, melena, CHEST PAIN, SHORTNESS OF BREATH,  CHANGE IN BOWEL IN HABITS, constipation, problems swallowing, problems with sedation, heartburn or indigestion.  Past Medical History:  Diagnosis Date  . Anemia   . Bright red rectal bleeding 09/13/2011  . Cancer ([place])   . Diarrhea   . History of chemotherapy    completed 09/2011   . Hx of radiation therapy 08/29/11 to 10/09/11   rectum  . Hx of tracheostomy ([place])    12/2012  . Ileostomy in place [facility])   . Rectal cancer [facility])    Past Surgical History:  Procedure Laterality Date  . CARPAL TUNNEL RELEASE    . COLONOSCOPY  07/26/2011   Procedure: COLONOSCOPY;  Surgeon: Dorothyann Peng, MD;  [facility];  Service: Endoscopy;  Laterality: N/A;  10:40  . ESOPHAGOGASTRODUODENOSCOPY  07/26/2011   SLF: 1. sessile polyp, multiple 2 internal hemorrhoids 3. rectal mass  . ESOPHAGOGASTRODUODENOSCOPY  12/27/2011   Procedure: ESOPHAGOGASTRODUODENOSCOPY (EGD);  Surgeon: Lear Ng, MD;  [facility];  Service: Endoscopy;  Laterality: N/A;  . EVALUATION UNDER ANESTHESIA WITH ANAL FISSUROTOMY N/A 11/06/2012   Procedure: Exam Under Anesthesia , Anal Dilation;  Surgeon: Stark Klein, MD;  [facility];  Service: General;  Laterality: N/A;  Exam Under Anesthesia , Anal Dilation  . FLEXIBLE SIGMOIDOSCOPY N/A 01/21/2016   Procedure: FLEXIBLE SIGMOIDOSCOPY;  Surgeon: Danie Binder, MD;  [facility];  Service: Endoscopy;  Laterality: N/A;  830   . I&D EXTREMITY Left 05/31/2013   Procedure: IRRIGATION AND DEBRIDEMENT EXTREMITY;  Surgeon: Linna Hoff, MD;  Location: [place];  Service: Orthopedics;  Laterality: Left;  . ILEOSTOMY CLOSURE N/A 04/24/2013   Procedure: ILEOSTOMY TAKEDOWN;  Surgeon: Stark Klein, MD;  [facility];  Service: General;  Laterality: N/A;  . LAPAROSCOPIC COLON RESECTION  12/14/11   diverting ileostomy  . LAPAROTOMY N/A 01/03/2013   Procedure: EXPLORATORY LAPAROTOMY WITH LYSIS OF ADHESIONS, revision of ileostomy;  Surgeon: Stark Klein, MD;  Location: [place];  Service: General;  Laterality: N/A;  . OPEN REDUCTION INTERNAL FIXATION (ORIF) METACARPAL Left 05/31/2013   Procedure: OPEN REDUCTION INTERNAL FIXATION (ORIF) METACARPAL;  Surgeon: Linna Hoff, MD;  Location: [place];  Service: Orthopedics;  Laterality: Left;  . PORT-A-CATH REMOVAL Left 04/29/2014   Procedure: REMOVAL PORT-A-CATH;  Surgeon: Stark Klein, MD;  Location: [place];  Service: General;  Laterality: Left;  . PORTACATH PLACEMENT  02/21/2012   Procedure: INSERTION PORT-A-CATH;  Surgeon: Stark Klein, MD;  Location: [place];  Service: General;  Laterality: N/A;  . TUBAL LIGATION     Allergies  Allergen Reactions  . Paregoric     CHEST PAIN   Current Outpatient Prescriptions  Medication Sig Dispense Refill  . B Complex-C (B-COMPLEX WITH VITAMIN C) tablet Take 1 tablet by mouth daily.    . diphenoxylate-atropine (LOMOTIL) 2.5-0.025 MG tablet TAKE TWO TABLETS FOUR TIMES DAILY    . hydrocortisone cream-nystatin cream-zinc oxide TOP TO AFFECTED AREA  TID    . Lidocaine-Hydrocortisone Ace 3-2.5 % KIT APPLY TO RECTUM QID AS NEEDED FOR RECTAL PAIN OR BLEEDING    . loperamide (IMODIUM) 2 MG capsule Two capsules QID for diarrhea.    . Multiple Vitamin (MULTIVITAMIN WITH MINERALS) TABS Take 1 tablet by mouth daily.    Marland Kitchen omeprazole (PRILOSEC) 20 MG capsule Take 1 capsule (20 mg total) by mouth daily.    Marland Kitchen OVER THE COUNTER MEDICATION Take 1 drop by mouth daily as needed (gas).      . Probiotic Product (PROBIOTIC DAILY PO) Take by mouth.    .       Review of Systems PER HPI OTHERWISE ALL SYSTEMS ARE NEGATIVE.     Objective:   Physical Exam  Constitutional: She is oriented to person, place, and time. She appears well-developed and well-nourished. No distress.  HENT:  Head: Normocephalic and atraumatic.  Mouth/Throat: Oropharynx is clear and moist. No oropharyngeal exudate.  Eyes: Pupils are equal, round, and reactive to light. No scleral icterus.  Neck: Normal range of motion. Neck supple.  Cardiovascular: Normal rate, regular rhythm and normal heart sounds.   Pulmonary/Chest: Effort normal and breath sounds normal. No respiratory distress.  Abdominal: Soft. Bowel sounds are normal. She exhibits no distension. There is tenderness. There is no rebound and no guarding.  MILD BLQs TTP  Musculoskeletal: She exhibits no edema.  Lymphadenopathy:    She has no cervical adenopathy.  Neurological: She is alert and oriented to person, place, and time.  NO FOCAL DEFICITS  Psychiatric:  ANXIOUS MOOD, NL AFFECT  Vitals reviewed.     Assessment & Plan:

## 2016-06-08 NOTE — Assessment & Plan Note (Signed)
SYMPTOMS FAIRLY WELL CONTROLLED.  CONTINUE TO MONITOR SYMPTOMS. 

## 2016-06-08 NOTE — Patient Instructions (Signed)
DROP OFF YOUR URINE SAMPLE.  FOLLOW FULL LIQUID DIET FOR 3 DAYS. AVOID DAIRY BUT YOU MAY HAVE EGG WHITES. SEE INFO BELOW.  CONTINUE LOMOTIL AND IMODIUM.  FOLLOW UP IN 2 MOS.    Full Liquid Diet A high-calorie, high-protein supplement should be used to meet your nutritional requirements when the full liquid diet is continued for more than 2 or 3 days. If this diet is to be used for an extended period of time (more than 7 days), a multivitamin should be considered.  Breads and Starches  Allowed: None are allowed except crackers WHOLE OR pureed (made into a thick, smooth soup) in soup.   Avoid: Any others.    Potatoes/Pasta/Rice  Allowed: ANY ITEM AS A SOUP OR SMALL PLATE OF MASHED POTATOES.       Vegetables  Allowed: Strained tomato or vegetable juice. Vegetables pureed in soup.   Avoid: Any others.    Fruit  Allowed: Any strained fruit juices and fruit drinks. Include 1 serving of citrus or vitamin C-enriched fruit juice daily.   Avoid: Any others.  Meat and Meat Substitutes  Allowed: Egg  Avoid: Any meat, fish, or fowl. All cheese.  Milk  Allowed: Milk beverages, including milk shakes and instant breakfast mixes. Smooth yogurt.   Avoid: Any others. Avoid dairy products if not tolerated.    Soups and Combination Foods  Allowed: Broth, strained cream soups. Strained, broth-based soups.   Avoid: Any others.    Desserts and Sweets  Allowed: flavored gelatin,plain ice cream, sherbet, smooth pudding, junket, fruit ices, frozen ice pops, pudding pops,, frozen fudge pops, chocolate syrup. Sugar, honey, jelly, syrup.   Avoid: Any others.  Fats and Oils  Allowed: Margarine, butter, cream, sour cream, oils.   Avoid: Any others.  Beverages  Allowed: All.   Avoid: None.  Condiments  Allowed: Iodized salt, pepper, spices, flavorings. Cocoa powder.   Avoid: Any others.    SAMPLE MEAL PLAN Breakfast   cup orange juice.   1 OR 2 EGGS   1 cup   milk.   1 cup beverage (coffee or tea).   Cream or sugar, if desired.    Midmorning Snack  2 SCRAMBLED OR HARD BOILED EGG   Lunch  1 cup cream soup.    cup fruit juice.   1 cup milk.    cup custard.   1 cup beverage (coffee or tea).   Cream or sugar, if desired.    Midafternoon Snack  1 cup milk shake.  Dinner  1 cup cream soup.    cup fruit juice.   1 cup milk.    cup pudding.   1 cup beverage (coffee or tea).   Cream or sugar, if desired.  Evening Snack  1 cup supplement.  To increase calories, add sugar, cream, butter, or margarine if possible. Nutritional supplements will also increase the total calories.

## 2016-06-08 NOTE — Assessment & Plan Note (Signed)
ACUTE SYMPTOMS STARTING 4-5 DAYS AGO AND MOST LIKELY DUE TO VIRAL ILLNESS, SIBO, AND LESS LIKELY CYSTITIS OR KIDNEY STONE.   I PERSONALLY REVIEWED THE CT SEP 13 WITH DR. Arelia Longest ACUTE PROCESS. CHECK UA. FOLLOW FULL LIQUID DIET FOR 3 DAYS. AVOID DAIRY BUT MAY HAVE EGG WHITES.   HANDOUT GIVEN. CONTINUE LOMOTIL AND IMODIUM.  FOLLOW UP IN 2 MOS.

## 2016-06-08 NOTE — Progress Notes (Signed)
ON RECALL  °

## 2016-06-08 NOTE — Progress Notes (Signed)
cc'ed to pcp °

## 2016-06-08 NOTE — Assessment & Plan Note (Signed)
WEIGHT SLOWING IMPROVING.  CONTINUE TO MONITOR SYMPTOMS.

## 2016-06-08 NOTE — Assessment & Plan Note (Signed)
LAST FLEX SIG APR 2017. NO WARNING SIGNS/SYMPTOMS   CONTINUE TO MONITOR SYMPTOMS.

## 2016-06-09 LAB — URINALYSIS, ROUTINE W REFLEX MICROSCOPIC
Bilirubin Urine: NEGATIVE
Glucose, UA: NEGATIVE
KETONES UR: NEGATIVE
Leukocytes, UA: NEGATIVE
NITRITE: NEGATIVE
PH: 6 (ref 5.0–8.0)
Protein, ur: NEGATIVE
SPECIFIC GRAVITY, URINE: 1.03 (ref 1.001–1.035)

## 2016-06-09 LAB — URINALYSIS, MICROSCOPIC ONLY
CASTS: NONE SEEN [LPF]
SQUAMOUS EPITHELIAL / LPF: NONE SEEN [HPF] (ref <=5)
Yeast: NONE SEEN [HPF]

## 2016-06-11 ENCOUNTER — Telehealth: Payer: Self-pay | Admitting: Gastroenterology

## 2016-06-11 NOTE — Telephone Encounter (Signed)
Called patient TO DISCUSS RESULTS. SHE FEELS BETTER. EXPLAINED SHE HAD SOME BACTERIA IN URINE BUT NO WHITE CELS. NO NEED FOR TREATMENT AT THIS TIME. PT WILL CALL WITH QUESTIONS OR CONCERNS.

## 2016-06-28 ENCOUNTER — Other Ambulatory Visit: Payer: Self-pay | Admitting: General Surgery

## 2016-06-28 DIAGNOSIS — Z1231 Encounter for screening mammogram for malignant neoplasm of breast: Secondary | ICD-10-CM

## 2016-07-06 ENCOUNTER — Encounter: Payer: Self-pay | Admitting: Gastroenterology

## 2016-07-21 ENCOUNTER — Ambulatory Visit
Admission: RE | Admit: 2016-07-21 | Discharge: 2016-07-21 | Disposition: A | Discharge status: Home / Self Care | Payer: Medicare Other | Source: Ambulatory Visit | Attending: General Surgery | Admitting: General Surgery

## 2016-07-21 DIAGNOSIS — Z1231 Encounter for screening mammogram for malignant neoplasm of breast: Secondary | ICD-10-CM

## 2016-08-09 ENCOUNTER — Encounter: Payer: Self-pay | Admitting: Gastroenterology

## 2016-08-09 ENCOUNTER — Ambulatory Visit (INDEPENDENT_AMBULATORY_CARE_PROVIDER_SITE_OTHER): Payer: Medicare Other | Admitting: Gastroenterology

## 2016-08-09 DIAGNOSIS — R1031 Right lower quadrant pain: Secondary | ICD-10-CM | POA: Diagnosis not present

## 2016-08-09 DIAGNOSIS — R1032 Left lower quadrant pain: Secondary | ICD-10-CM | POA: Diagnosis not present

## 2016-08-09 DIAGNOSIS — C20 Malignant neoplasm of rectum: Secondary | ICD-10-CM

## 2016-08-09 MED ORDER — LOPERAMIDE HCL 2 MG PO CAPS: ORAL_CAPSULE | ORAL | 11 refills | Status: DC

## 2016-08-09 MED ORDER — DIPHENOXYLATE-ATROPINE 2.5-0.025 MG PO TABS: ORAL_TABLET | ORAL | 11 refills | Status: DC

## 2016-08-09 NOTE — Patient Instructions (Signed)
NEXT FLEX SIG IF NEEDED WILL BE IN 2022.  ALL OF YOUR SISTERS, BROTHERS, CHILDREN, AND PARENTS NEED TO HAVE A COLONOSCOPY STARTING AT THE AGE OF 36 and then every 5 years.  FOLLOW UP IN 1 YEAR.

## 2016-08-09 NOTE — Progress Notes (Signed)
ON RECALL  °

## 2016-08-09 NOTE — Progress Notes (Signed)
cc'ed to pcp °

## 2016-08-09 NOTE — Assessment & Plan Note (Signed)
NO WARNING SIGNS/SYMPTOMS  NEXT FLEX SIG IF NEEDED WILL BE IN 2022. ALL SISTERS, BROTHERS, CHILDREN, AND PARENTS NEED TO HAVE A COLONOSCOPY STARTING AT THE AGE OF 36 and then every 5 years. FOLLOW UP IN 1 YEAR.

## 2016-08-09 NOTE — Assessment & Plan Note (Signed)
SYMPTOMS CONTROLLED/RESOLVED.  CONTINUE TO MONITOR SYMPTOMS. 

## 2016-08-09 NOTE — Progress Notes (Signed)
Subjective:    Patient ID: Renee Nicholson, female    DOB: [birth year], 50 y.o.   MRN: [id number]  [name], MD   HPI LAST SEEN SEP 2017: 95 LBS. PAIN GAS BLOATING. UA NEGATIVE. FLEX SIG APR 2017. INITIAL VISIT-OCT 2012, DX: RECTAL CANCER NOV 2012. OCT 2014: 77 LBS. ON TPN FROM   Bowels: same 4-5/day. SOMETIMES #6 OR 7. EATING BETTER THAN BEFORE. HAS TROUBLE WITH CHOLESTEROL. FLU SHOT UP TO DATE. WEIGHT STABLE: 94 LBS. BOWELS ARE OK WITH LOMOTIL/IMODIUM 30 MINS BEFORE MEALS TID. PT DENIES FEVER, CHILLS, HEMATOCHEZIA, HEMATEMESIS, nausea, vomiting, melena, CHEST PAIN, SHORTNESS OF BREATH,  CHANGE IN BOWEL IN HABITS, constipation, abdominal pain, problems swallowing, OR heartburn or indigestion.   Past Medical History:  Diagnosis Date  . Diarrhea   . Glaucoma   . Hx of radiation therapy 08/29/11 to 10/09/11   rectum  . Rectal cancer [facility])     Past Surgical History:  Procedure Laterality Date  . CARPAL TUNNEL RELEASE    . COLONOSCOPY  07/26/2011   Procedure: COLONOSCOPY;  Surgeon: Dorothyann Peng, MD;  [facility];  Service: Endoscopy;  Laterality: N/A;  10:40  . ESOPHAGOGASTRODUODENOSCOPY  07/26/2011   SLF: 1. sessile polyp, multiple 2 internal hemorrhoids 3. rectal mass  . ESOPHAGOGASTRODUODENOSCOPY  12/27/2011   Procedure: ESOPHAGOGASTRODUODENOSCOPY (EGD);  Surgeon: Lear Ng, MD;  Location: [facility];  Service: Endoscopy;  Laterality: N/A;  . EVALUATION UNDER ANESTHESIA WITH ANAL FISSUROTOMY N/A 11/06/2012   Procedure: Exam Under Anesthesia , Anal Dilation;  Surgeon: Stark Klein, MD;  [facility];  Service: General;  Laterality: N/A;  Exam Under Anesthesia , Anal Dilation  . FLEXIBLE SIGMOIDOSCOPY N/A 01/21/2016   Procedure: FLEXIBLE SIGMOIDOSCOPY;  Surgeon: Danie Binder, MD;  [facility];  Service: Endoscopy;  Laterality: N/A;  830   . I&D EXTREMITY Left 05/31/2013   Procedure: IRRIGATION AND DEBRIDEMENT EXTREMITY;  Surgeon: Linna Hoff, MD;  Location: [place];  Service: Orthopedics;  Laterality: Left;  . ILEOSTOMY CLOSURE N/A 04/24/2013   Procedure: ILEOSTOMY TAKEDOWN;  Surgeon: Stark Klein, MD;  [facility];  Service: General;  Laterality: N/A;  . LAPAROSCOPIC COLON RESECTION  12/14/11   diverting ileostomy  . LAPAROTOMY N/A 01/03/2013   Procedure: EXPLORATORY LAPAROTOMY WITH LYSIS OF ADHESIONS, revision of ileostomy;  Surgeon: Stark Klein, MD;  Location: [place];  Service: General;  Laterality: N/A;  . OPEN REDUCTION INTERNAL FIXATION (ORIF) METACARPAL Left 05/31/2013   Procedure: OPEN REDUCTION INTERNAL FIXATION (ORIF) METACARPAL;  Surgeon: Linna Hoff, MD;  Location: [place];  Service: Orthopedics;  Laterality: Left;  . PORT-A-CATH REMOVAL Left 04/29/2014   Procedure: REMOVAL PORT-A-CATH;  Surgeon: Stark Klein, MD;  Location: [place];  Service: General;  Laterality: Left;  . PORTACATH PLACEMENT  02/21/2012   Procedure: INSERTION PORT-A-CATH;  Surgeon: Stark Klein, MD;  Location: [place];  Service: General;  Laterality: N/A;  . TRACHESOTOMY    . TUBAL LIGATION      Allergies  Allergen Reactions  . Paregoric     CHEST PAIN   Current Outpatient Prescriptions  Medication Sig Dispense Refill  . B Complex-C (B-COMPLEX WITH VITAMIN C) tablet Take 1 tablet by mouth daily.    . diphenoxylate-atropine 2.5-0.025 MG tablet TAKE TWO TABLETS THREETIMES DAILY    . hydrocortisone cream-nystatin cream-zinc oxide TOP TO AFFECTED AREA TID RARE   . Lidocaine-Hydrocortisone Ace 3-2.5 % KIT APPLY TO RECTUM QID AS  NEEDED FOR RECTAL PAIN OR BLEEDING    . loperamide 2 MG capsule Two capsules TID for diarrhea.    . Multiple Vitamin  Take 1 tablet by mouth daily.    .      .      . Probiotic Product Take by mouth.    .       Review of Systems PER HPI OTHERWISE ALL SYSTEMS ARE NEGATIVE.    Objective:   Physical Exam  Constitutional: She is oriented to person, place, and time. She appears well-developed and  well-nourished. No distress.  HENT:  Head: Normocephalic and atraumatic.  Mouth/Throat: Oropharynx is clear and moist. No oropharyngeal exudate.  Eyes: Pupils are equal, round, and reactive to light. No scleral icterus.  Neck: Normal range of motion. Neck supple.  Cardiovascular: Normal rate, regular rhythm and normal heart sounds.   Pulmonary/Chest: Effort normal and breath sounds normal. No respiratory distress.  Abdominal: Soft. Bowel sounds are normal. She exhibits no distension. There is no tenderness.  Musculoskeletal: She exhibits no edema.  Lymphadenopathy:    She has no cervical adenopathy.  Neurological: She is alert and oriented to person, place, and time.  Psychiatric: She has a normal mood and affect.  Vitals reviewed.     Assessment & Plan:

## 2016-09-22 ENCOUNTER — Telehealth: Payer: Self-pay | Admitting: Oncology

## 2016-09-22 ENCOUNTER — Other Ambulatory Visit (HOSPITAL_BASED_OUTPATIENT_CLINIC_OR_DEPARTMENT_OTHER): Payer: Medicare Other

## 2016-09-22 ENCOUNTER — Ambulatory Visit (HOSPITAL_BASED_OUTPATIENT_CLINIC_OR_DEPARTMENT_OTHER): Payer: Medicare Other | Admitting: Oncology

## 2016-09-22 ENCOUNTER — Other Ambulatory Visit: Payer: Self-pay | Admitting: Nurse Practitioner

## 2016-09-22 ENCOUNTER — Encounter: Payer: Self-pay | Admitting: Oncology

## 2016-09-22 VITALS — BP 132/85 | HR 68 | Temp 99.3°F | Resp 16 | Ht <= 58 in | Wt 93.8 lb

## 2016-09-22 DIAGNOSIS — C20 Malignant neoplasm of rectum: Secondary | ICD-10-CM

## 2016-09-22 DIAGNOSIS — R197 Diarrhea, unspecified: Secondary | ICD-10-CM | POA: Diagnosis not present

## 2016-09-22 LAB — CEA (IN HOUSE-CHCC): CEA (CHCC-In House): <1 ng/mL (ref 0.00–5.00)

## 2016-09-22 NOTE — Telephone Encounter (Signed)
Appointments scheduled per 09/22/16 los. Patient was given a copy of the AVS report and appointment schedule, per 09/22/16 los. °
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
## 2016-09-23 LAB — CEA (PARALLEL TESTING)

## 2016-09-26 ENCOUNTER — Telehealth: Payer: Self-pay | Admitting: *Deleted

## 2016-09-26 NOTE — Telephone Encounter (Signed)
-----   Message from Ladell Pier, MD sent at 09/24/2016  3:36 PM EST ----- Please call patient, cea is normal

## 2016-09-26 NOTE — Telephone Encounter (Signed)
Informed pt of normal CEA result. She voiced understanding.

## 2017-02-10 ENCOUNTER — Other Ambulatory Visit: Payer: Self-pay | Admitting: Gastroenterology

## 2017-03-07 ENCOUNTER — Ambulatory Visit (INDEPENDENT_AMBULATORY_CARE_PROVIDER_SITE_OTHER): Payer: Self-pay | Admitting: Gastroenterology

## 2017-03-07 VITALS — BP 109/69 | HR 73 | Temp 96.5°F | Ht 59.0 in | Wt 88.0 lb

## 2017-03-07 DIAGNOSIS — R634 Abnormal weight loss: Secondary | ICD-10-CM | POA: Diagnosis not present

## 2017-03-07 LAB — CBC WITH DIFFERENTIAL/PLATELET
Basophils Absolute: 0 cells/uL (ref 0–200)
Basophils Relative: 0 %
EOS ABS: 310 {cells}/uL (ref 15–500)
Eosinophils Relative: 5 %
HEMATOCRIT: 41.1 % (ref 35.0–45.0)
Hemoglobin: 13.3 g/dL (ref 11.7–15.5)
LYMPHS PCT: 25 %
Lymphs Abs: 1550 cells/uL (ref 850–3900)
MCH: 29.6 pg (ref 27.0–33.0)
MCHC: 32.4 g/dL (ref 32.0–36.0)
MCV: 91.3 fL (ref 80.0–100.0)
MONO ABS: 434 {cells}/uL (ref 200–950)
MPV: 9.4 fL (ref 7.5–12.5)
Monocytes Relative: 7 %
NEUTROS PCT: 63 %
Neutro Abs: 3906 cells/uL (ref 1500–7800)
Platelets: 228 10*3/uL (ref 140–400)
RBC: 4.5 MIL/uL (ref 3.80–5.10)
RDW: 13 % (ref 11.0–15.0)
WBC: 6.2 10*3/uL (ref 3.8–10.8)

## 2017-03-07 NOTE — Progress Notes (Addendum)
REVIEWED. OPV FOR WEIGHT LOSS. DEC 2017: 94 LBS AND NOW WEIGHT 88 LBS. RECENT CT-NO RECURRENT DISEASE. JUN 2081: TSH NL. ONC APPT JUN 28. HAVE PT RETURN FOR WEIGHT CHECK '@JUL'$  13 OR 16.      Referring Provider: [name], MD Primary Care Physician:  [name], MD  Primary GI: Dr. Oneida Alar   Chief Complaint  Patient presents with  . Weight Loss    HPI:   Renee Nicholson is a 51 y.o. female presenting today with a history of rectal cancer diagnosed in Nov 2012, s/p total proctocolectomy with ileo-anal anastomosis in 2013, ileostomy reversal in 2014, flex sig last year with no recurrent disease, presenting with concerns for weight loss. She was 93 pounds in Dec 2017 and down to 88 pounds today.   Bread, rice, for lunch. Boost for breakfast. Dinner: ensure, sandwich. Used to drink Boost, then stopped for awhile and was drinking 2% milk, now started back up for the past month.   No abdominal pain. No nausea. 1 or 2pm have BM, 4-5 times per day BM but soft. No diarrhea at night. Afternoon has a little more diarrhea. Not watery. Like soft serve ice cream. No rectal bleeding. No abdominal pain.   Has a slight cough sometimes in the morning and has noticed sinus issues. Wonders if this may be the problem. No dysphagia or typical GERD symptoms.   Past Medical History:  Diagnosis Date  . Diarrhea   . Glaucoma   . Hx of radiation therapy 08/29/11 to 10/09/11   rectum  . Rectal cancer [facility])     Past Surgical History:  Procedure Laterality Date  . CARPAL TUNNEL RELEASE    . COLONOSCOPY  07/26/2011   Procedure: COLONOSCOPY;  Surgeon: Dorothyann Peng, MD;  [facility];  Service: Endoscopy;  Laterality: N/A;  10:40  . ESOPHAGOGASTRODUODENOSCOPY  07/26/2011   SLF: 1. sessile polyp, multiple 2 internal hemorrhoids 3. rectal mass  . ESOPHAGOGASTRODUODENOSCOPY  12/27/2011   Procedure: ESOPHAGOGASTRODUODENOSCOPY (EGD);  Surgeon: Lear Ng, MD;  [facility];   Service: Endoscopy;  Laterality: N/A;  . EVALUATION UNDER ANESTHESIA WITH ANAL FISSUROTOMY N/A 11/06/2012   Procedure: Exam Under Anesthesia , Anal Dilation;  Surgeon: Stark Klein, MD;  [facility];  Service: General;  Laterality: N/A;  Exam Under Anesthesia , Anal Dilation  . FLEXIBLE SIGMOIDOSCOPY N/A 01/21/2016   Procedure: FLEXIBLE SIGMOIDOSCOPY;  Surgeon: Danie Binder, MD;  [facility];  Service: Endoscopy;  Laterality: N/A;  830   . I&D EXTREMITY Left 05/31/2013   Procedure: IRRIGATION AND DEBRIDEMENT EXTREMITY;  Surgeon: Linna Hoff, MD;  Location: [place];  Service: Orthopedics;  Laterality: Left;  . ILEOSTOMY CLOSURE N/A 04/24/2013   Procedure: ILEOSTOMY TAKEDOWN;  Surgeon: Stark Klein, MD;  [facility];  Service: General;  Laterality: N/A;  . LAPAROSCOPIC COLON RESECTION  12/14/11   diverting ileostomy  . LAPAROTOMY N/A 01/03/2013   Procedure: EXPLORATORY LAPAROTOMY WITH LYSIS OF ADHESIONS, revision of ileostomy;  Surgeon: Stark Klein, MD;  Location: [place];  Service: General;  Laterality: N/A;  . OPEN REDUCTION INTERNAL FIXATION (ORIF) METACARPAL Left 05/31/2013   Procedure: OPEN REDUCTION INTERNAL FIXATION (ORIF) METACARPAL;  Surgeon: Linna Hoff, MD;  Location: [place];  Service: Orthopedics;  Laterality: Left;  . PORT-A-CATH REMOVAL Left 04/29/2014   Procedure: REMOVAL PORT-A-CATH;  Surgeon: Stark Klein, MD;  Location: [place];  Service: General;  Laterality: Left;  . PORTACATH  PLACEMENT  02/21/2012   Procedure: INSERTION PORT-A-CATH;  Surgeon: Stark Klein, MD;  Location: [place];  Service: General;  Laterality: N/A;  . TRACHESOTOMY    . TUBAL LIGATION      Current Outpatient Prescriptions  Medication Sig Dispense Refill  . B Complex-C (B-COMPLEX WITH VITAMIN C) tablet Take 1 tablet by mouth daily. 30 tablet 1  . diphenoxylate-atropine (LOMOTIL) 2.5-0.025 MG tablet TAKE TWO TABLETS BY MOUTH FOUR TIMES A DAY 240 tablet 0  . hydrocortisone  cream-nystatin cream-zinc oxide TOP TO AFFECTED AREA TID 1 Tube 11  . Lidocaine-Hydrocortisone Ace 3-2.5 % KIT APPLY TO RECTUM QID AS NEEDED FOR RECTAL PAIN OR BLEEDING 1 each 3  . loperamide (IMODIUM) 2 MG capsule Two capsules QID for diarrhea. 240 capsule 11  . Multiple Vitamin (MULTIVITAMIN WITH MINERALS) TABS Take 1 tablet by mouth daily.    Marland Kitchen omeprazole (PRILOSEC) 20 MG capsule Take 1 capsule (20 mg total) by mouth daily. 30 capsule 11  . OVER THE COUNTER MEDICATION Take 1 drop by mouth daily as needed (gas).    . Probiotic Product (PROBIOTIC DAILY PO) Take by mouth.     No current facility-administered medications for this visit.    Facility-Administered Medications Ordered in Other Visits  Medication Dose Route Frequency Provider Last Rate Last Dose  . heparin lock flush 100 unit/mL  500 Units Intravenous Once Marcy Panning, MD      . sodium chloride 0.9 % injection 10 mL  10 mL Intravenous PRN Marcy Panning, MD        Allergies as of 03/07/2017 - Review Complete 03/07/2017  Allergen Reaction Noted  . Paregoric  11/26/2014    Family History  Problem Relation Age of Onset  . Diabetes Mother   . Cancer Brother 51       rectal cancer lives in [place]  . Colon cancer Neg Hx     Social History   Social History  . Marital status: Married    Spouse name: N/A  . Number of children: 2  . Years of education: N/A   Occupational History  . self employed, [employer]    Social History Main Topics  . Smoking status: Never Smoker  . Smokeless tobacco: Never Used  . Alcohol use No  . Drug use: No  . Sexual activity: Yes   Other Topics Concern  . Not on file   Social History Narrative  . No narrative on file    Review of Systems: As mentioned in HPI   Physical Exam: BP 109/69   Pulse 73   Temp (!) 96.5 F (35.8 C) (Oral)   Ht '4\' 11"'$  (1.499 m)   Wt 88 lb (39.9 kg)   LMP 02/20/2012   BMI 17.77 kg/m  General:   Alert and oriented. No distress noted. Pleasant and  cooperative.  Head:  Normocephalic and atraumatic. Eyes:  Conjuctiva clear without scleral icterus. Mouth:  Oral mucosa pink and moist.  Heart:  S1, S2 present without murmurs, rubs, or gallops. Regular rate and rhythm. Abdomen:  +BS, soft, non-tender and non-distended. No rebound or guarding. No HSM or masses noted. Msk:  Symmetrical without gross deformities. Normal posture. Extremities:  Without edema. Neurologic:  Alert and  oriented x4;  grossly normal neurologically. Psych:  Alert and cooperative. Normal mood and affect.

## 2017-03-07 NOTE — Patient Instructions (Addendum)
Please have blood work done today.   Continue the boost vanilla twice a day. I have given you a handwritten prescription to take to a pharmacy to see if they can order this cheaper.   I have included a list of protein foods.   I think you lost weight because of changing the boost to 2% milk. However, we will be very thorough and make sure that you are doing well and do any other tests that are needed! We will see you in 4 weeks.    Protein Content in Foods Generally, most healthy people need around 50 grams of protein each day. Depending on your overall health, you may need more or less protein in your diet. Talk to your health care provider or dietitian about how much protein you need. See the following list for the protein content of some common foods. High-protein foods High-protein foods contain 4 grams (4 g) or more of protein per serving. They include:  Beef, ground sirloin (cooked) - 3 oz have 24 g of protein.  Cheese (hard) - 1 oz has 7 g of protein.  Chicken breast, boneless and skinless (cooked) - 3 oz have 13.4 g of protein.  Cottage cheese - 1/2 cup has 13.4 g of protein.  Egg - 1 egg has 6 g of protein.  Fish, filet (cooked) - 1 oz has 6-7 g of protein.  Garbanzo beans (canned or cooked) - 1/2 cup has 6-7 g of protein.  Kidney beans (canned or cooked) - 1/2 cup has 6-7 g of protein.  Lamb (cooked) - 3 oz has 24 g of protein.  Milk - 1 cup (8 oz) has 8 g of protein.  Nuts (peanuts, pistachios, almonds) - 1 oz has 6 g of protein.  Peanut butter - 1 oz has 7-8 g of protein.  Pork tenderloin (cooked) - 3 oz has 18.4 g of protein.  Pumpkin seeds - 1 oz has 8.5 g of protein.  Soybeans (roasted) - 1 oz has 8 g of protein.  Soybeans (cooked) - 1/2 cup has 11 g of protein.  Soy milk - 1 cup (8 oz) has 5-10 g of protein.  Soy or vegetable patty - 1 patty has 11 g of protein.  Sunflower seeds - 1 oz has 5.5 g of protein.  Tofu (firm) - 1/2 cup has 20 g of  protein.  Tuna (canned in water) - 3 oz has 20 g of protein.  Yogurt - 6 oz has 8 g of protein.  Low-protein foods Low-protein foods contain 3 grams (3 g) or less of protein per serving. They include:  Beets (raw or cooked) - 1/2 cup has 1.5 g of protein.  Bran cereal - 1/2 cup has 2-3 g of protein.  Bread - 1 slice has 2.5 g of protein.  Broccoli (raw or cooked) - 1/2 cup has 2 g of protein.  Collard greens (raw or cooked) - 1/2 cup has 2 g of protein.  Corn (fresh or cooked) - 1/2 cup has 2 g of protein.  Cream cheese - 1 oz has 2 g of protein.  Creamer (half-and-half) - 1 oz has 1 g of protein.  Flour tortilla - 1 tortilla has 2.5 g of protein  Frozen yogurt - 1/2 cup has 3 g of protein.  Fruit or vegetable juice - 1/2 cup has 1 g of protein.  Green beans (raw or cooked) - 1/2 cup has 1 g of protein.  Green peas (canned) - 1/2 cup  has 3.5 g of protein.  Muffins - 1 small muffin (2 oz) has 3 g of protein.  Oatmeal (cooked) - 1/2 cup has 3 g of protein.  Potato (baked with skin) - 1 medium potato has 3 g of protein.  Rice (cooked) - 1/2 cup has 2.5-3.5 g of protein.  Sour cream - 1/2 cup has 2.5 g of protein.  Spinach (cooked) - 1/2 cup has 3 g of protein.  Squash (cooked) - 1/2 cup has 1.5 g of protein.  Actual amounts of protein may be different depending on processing. Talk with your health care provider or dietitian about what foods are recommended for you. This information is not intended to replace advice given to you by your health care provider. Make sure you discuss any questions you have with your health care provider. Document Released: 12/11/2015 Document Revised: 05/22/2016 Document Reviewed: 05/22/2016 Elsevier Interactive Patient Education  [employer].

## 2017-03-08 ENCOUNTER — Telehealth: Payer: Self-pay

## 2017-03-08 ENCOUNTER — Telehealth: Payer: Self-pay | Admitting: Gastroenterology

## 2017-03-08 ENCOUNTER — Encounter (HOSPITAL_COMMUNITY): Payer: Self-pay

## 2017-03-08 ENCOUNTER — Ambulatory Visit (HOSPITAL_COMMUNITY)
Admission: RE | Admit: 2017-03-08 | Discharge: 2017-03-08 | Disposition: A | Discharge status: Home / Self Care | Payer: Medicare Other | Source: Ambulatory Visit | Attending: Gastroenterology | Admitting: Gastroenterology

## 2017-03-08 ENCOUNTER — Other Ambulatory Visit: Payer: Self-pay

## 2017-03-08 DIAGNOSIS — R634 Abnormal weight loss: Secondary | ICD-10-CM

## 2017-03-08 DIAGNOSIS — M7989 Other specified soft tissue disorders: Secondary | ICD-10-CM | POA: Insufficient documentation

## 2017-03-08 DIAGNOSIS — K8020 Calculus of gallbladder without cholecystitis without obstruction: Secondary | ICD-10-CM | POA: Insufficient documentation

## 2017-03-08 DIAGNOSIS — Z85048 Personal history of other malignant neoplasm of rectum, rectosigmoid junction, and anus: Secondary | ICD-10-CM

## 2017-03-08 DIAGNOSIS — C218 Malignant neoplasm of overlapping sites of rectum, anus and anal canal: Secondary | ICD-10-CM | POA: Diagnosis not present

## 2017-03-08 LAB — TSH: TSH: 1.08 mIU/L

## 2017-03-08 MED ORDER — IOPAMIDOL (ISOVUE-300) INJECTION 61%
75.0000 mL | Freq: Once | INTRAVENOUS | Status: AC | PRN
  Administered 2017-03-08: 75 mL via INTRAVENOUS

## 2017-03-08 NOTE — Telephone Encounter (Signed)
Renee Nicholson at [employer] called office. Pt to arrive at [facility] at 12:15pm to start drinking contrast, appt is at 2:30pm. NPO after 10:30am. Called and informed pt.

## 2017-03-08 NOTE — Telephone Encounter (Signed)
T/C from Santiago Glad at [facility] with call report on the CT for pt. Nothing critical, just forwarding to Neil Crouch, PA for FYI in Roseanne Kaufman and Dr. Oneida Alar absence today.

## 2017-03-08 NOTE — Progress Notes (Signed)
TSH and CBC is normal. Dr. Oneida Alar recommends a CT abd/pelvis. I have already sent the request to have this done to the clinical pool. I would like it done ASAP if possible.

## 2017-03-08 NOTE — Telephone Encounter (Signed)
Called and informed pt at 8:45am. She has only had water to drink this morning and had some when I called. She can have CT done today. Told her to not eat or drink anything until I call her back. Called Central Scheduling, Vivien Rota unable to speak to anyone at [facility]. Vivien Rota will call office back.

## 2017-03-08 NOTE — Progress Notes (Signed)
PT is aware. Forwarding to [facility] to schedule the CT.

## 2017-03-08 NOTE — Telephone Encounter (Signed)
Ok to save for AB tomorrow.

## 2017-03-08 NOTE — Telephone Encounter (Signed)
Forwarding to Anna Boone, NP.  

## 2017-03-08 NOTE — Telephone Encounter (Signed)
Please let patient know that Dr. Oneida Alar is aware she was seen yesterday, and this is the plan:  1. Continue with labs that were ordered. 2. Needs CT abd/pelvis with IV contrast ASAP.    If there is no evidence of recurrent disease, she will continue follow-up with oncology, which is actually scheduled quite soon.

## 2017-03-09 ENCOUNTER — Telehealth: Payer: Self-pay | Admitting: Gastroenterology

## 2017-03-09 DIAGNOSIS — R634 Abnormal weight loss: Secondary | ICD-10-CM | POA: Insufficient documentation

## 2017-03-09 NOTE — Telephone Encounter (Signed)
Let's have her follow-up in 3 months.

## 2017-03-09 NOTE — Assessment & Plan Note (Signed)
51 year old female with history of rectal cancer, presenting with weight loss since last visit. She notes stopping boost for a bit of time and drinking 2% milk at that time; she just recently resumed boost. She has no rectal bleeding, changes in bowel habits, abdominal pain, or other alarm symptoms. Will check CBC, TSH now. CT abd/pelvis ordered. Flex sig up-to-date as of last year. I suspect the change in calorie intake is the most likely reason for her weight loss. Further recommendations to follow.

## 2017-03-09 NOTE — Progress Notes (Signed)
CC'ED TO PCP 

## 2017-03-09 NOTE — Telephone Encounter (Signed)
CT reviewed. No acute findings. Keep appt with oncology. I informed patient.

## 2017-03-12 ENCOUNTER — Encounter: Payer: Self-pay | Admitting: Gastroenterology

## 2017-03-12 NOTE — Telephone Encounter (Signed)
APPT MADE AND LETTER SENT  °

## 2017-03-21 NOTE — Progress Notes (Signed)
Please have patient return for weight check July 13th or 16th. Keep appt in September.

## 2017-03-22 ENCOUNTER — Ambulatory Visit (HOSPITAL_BASED_OUTPATIENT_CLINIC_OR_DEPARTMENT_OTHER): Payer: Medicare Other | Admitting: Oncology

## 2017-03-22 ENCOUNTER — Telehealth: Payer: Self-pay | Admitting: Oncology

## 2017-03-22 ENCOUNTER — Inpatient Hospital Stay: Payer: Medicare Other | Attending: Oncology

## 2017-03-22 VITALS — BP 117/92 | HR 54 | Temp 97.8°F | Resp 18 | Ht 59.0 in | Wt 88.2 lb

## 2017-03-22 DIAGNOSIS — C20 Malignant neoplasm of rectum: Secondary | ICD-10-CM

## 2017-03-22 LAB — CEA (IN HOUSE-CHCC): CEA (CHCC-In House): 1.24 ng/mL (ref 0.00–5.00)
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
## 2017-03-22 NOTE — Progress Notes (Signed)
Tried to call, pt is not home now, at another appt.

## 2017-03-22 NOTE — Telephone Encounter (Signed)
Lab and follow up scheduled for 1 year with Lattie Haw, per 03/22/17 los. Patient was given a copy of the AVS report and appointment schedule per 03/22/17 los.

## 2017-03-23 LAB — CEA (PARALLEL TESTING)

## 2017-03-23 NOTE — Progress Notes (Signed)
Pt is aware to come in for WT check on July 13 or 16 th

## 2017-04-09 ENCOUNTER — Telehealth: Payer: Self-pay

## 2017-04-09 ENCOUNTER — Other Ambulatory Visit: Payer: Self-pay

## 2017-04-09 NOTE — Telephone Encounter (Signed)
Called patient and changed to an appointment with slf

## 2017-04-09 NOTE — Telephone Encounter (Signed)
Pt came by office today for weight check per AB. Her weight is 88.4lb. Routing to AB and to EG (since AB is out of the office).

## 2017-04-09 NOTE — Telephone Encounter (Signed)
Weight stable compared to OV 2 weeks ago. CT noted to be normal as was CBC and TSH. Patient has followup with oncology. No further recommendations at this time. Continue boost supplement as discussed at her OV 2 weeks ago. Will cc AB as FYI. Patient can call with any questions/concerns.

## 2017-04-09 NOTE — Telephone Encounter (Signed)
PT is aware and would like to cancel her appt with AB in Sept and schedule an appt with SF. Erline Levine, please schedule.

## 2017-04-10 MED ORDER — DIPHENOXYLATE-ATROPINE 2.5-0.025 MG PO TABS: ORAL_TABLET | ORAL | 0 refills | Status: DC

## 2017-04-11 NOTE — Telephone Encounter (Signed)
REVIEWED. WEIGHT STABLE AT 88 LBS.

## 2017-05-04 ENCOUNTER — Other Ambulatory Visit: Payer: Self-pay | Admitting: Gastroenterology

## 2017-05-31 ENCOUNTER — Encounter: Payer: Self-pay | Admitting: Gastroenterology

## 2017-05-31 ENCOUNTER — Ambulatory Visit (INDEPENDENT_AMBULATORY_CARE_PROVIDER_SITE_OTHER): Payer: Medicare Other | Admitting: Gastroenterology

## 2017-05-31 DIAGNOSIS — K591 Functional diarrhea: Secondary | ICD-10-CM

## 2017-05-31 DIAGNOSIS — E43 Unspecified severe protein-calorie malnutrition: Secondary | ICD-10-CM

## 2017-05-31 DIAGNOSIS — R634 Abnormal weight loss: Secondary | ICD-10-CM

## 2017-05-31 DIAGNOSIS — K6289 Other specified diseases of anus and rectum: Secondary | ICD-10-CM | POA: Diagnosis not present

## 2017-05-31 MED ORDER — LIDOCAINE-HYDROCORTISONE ACE 3-2.5 % RE KIT: PACK | RECTAL | 3 refills | Status: DC

## 2017-05-31 NOTE — Patient Instructions (Addendum)
DRINK BOOST three TIMES A DAY.  USE APOTHECARY CREAM FOUR TIMES  A DAY IF NEEDED TO RELIEVE RECTAL PAIN/PRESSURE/BLEEDING.  CONTINUE LOMOTIL AND IMODIUM.  PLEASE CALL WITH QUESTIONS OR CONCERNS.  FOLLOW UP IN 4 MOS.

## 2017-05-31 NOTE — Assessment & Plan Note (Signed)
WEIGHT STABLE AT 88.8 LBS ON BOOST BID.  INCREASE BOOST TO TID. CONTINUE TO MONITOR SYMPTOMS. FOLLOW UP IN 4 MOS.

## 2017-05-31 NOTE — Progress Notes (Signed)
Subjective:    Patient ID: Renee Nicholson, female    DOB: 07-15-1966, 51 y.o.   MRN: [id number]  HPI Weight stable: 88 lbs. Last MONTH saw blood for 3-4 weeks. MAY HAVE LOTS OF WATERY STOOLS. RIGHT NOW NO BLOOD IN STOOL EXCEPT WHEN SHE WIPES. APPETITE IS GOOD. HUNGRY TOO MUCH. APOTHECARY HEMORRHOID CREAM HELPS. BMs: 5-6 A DAY(#4-#7). USES IMODIUM TID. LOMOTIL: 3X/DAY. DIARRHEA ASSOCIATED WITH BLOOD BUT NOT WITH FORMED STOOL. DRINKING BOOST 2/DAY. NOT INTERESTED IN 3/DAY. 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 BEEN BUSY WITH DOCTOR'S APPOINTMENTS.    PT DENIES FEVER, CHILLS, HEMATEMESIS, nausea, vomiting, melena, CHEST PAIN, SHORTNESS OF BREATH, CHANGE IN BOWEL IN HABITS, constipation, abdominal pain, problems swallowing, OR heartburn or indigestion.  Past Medical History:  Diagnosis Date  . Diarrhea   . Glaucoma   . Hx of radiation therapy 08/29/11 to 10/09/11   rectum  . Rectal cancer [facility])    Past Surgical History:  Procedure Laterality Date  . CARPAL TUNNEL RELEASE    . COLONOSCOPY  07/26/2011     . ESOPHAGOGASTRODUODENOSCOPY  07/26/2011   SLF: 1. sessile polyp, multiple 2 internal hemorrhoids 3. rectal mass  . ESOPHAGOGASTRODUODENOSCOPY  12/27/2011     . EVALUATION UNDER ANESTHESIA WITH ANAL FISSUROTOMY N/A 11/06/2012     . FLEXIBLE SIGMOIDOSCOPY N/A 01/21/2016     . I&D EXTREMITY Left 05/31/2013     . ILEOSTOMY CLOSURE N/A 04/24/2013     . LAPAROSCOPIC COLON RESECTION  12/14/11   diverting ileostomy  . LAPAROTOMY N/A 01/03/2013     . OPEN REDUCTION INTERNAL FIXATION (ORIF) METACARPAL Left 05/31/2013     . PORT-A-CATH REMOVAL Left 04/29/2014     . PORTACATH PLACEMENT  02/21/2012     . TRACHESOTOMY    . TUBAL LIGATION     Allergies  Allergen Reactions  . Paregoric     CHEST PAIN   Current Outpatient Prescriptions  Medication Sig Dispense Refill  . B Complex-C (B-COMPLEX WITH VITAMIN C) tablet Take 1 tablet by mouth daily.    .  diphenoxylate-atropine (LOMOTIL) 2.5-0.025 MG tablet TAKE 2 TABLETS BY MOUTH FOUR TIMES A DAY    .      . Lidocaine-Hydrocortisone Ace 3-2.5 % KIT APPLY TO RECTUM QID AS NEEDED FOR RECTAL PAIN OR BLEEDING    . loperamide (IMODIUM) 2 MG capsule Two capsules TID for diarrhea.    . Multiple Vitamin (MULTIVITAMIN WITH MINERALS) TABS Take 1 tablet by mouth daily.    Marland Kitchen omeprazole (PRILOSEC) 20 MG capsule Take 1 capsule (20 mg total) by mouth daily.    Marland Kitchen OVER THE COUNTER MEDICATION Take 1 drop by mouth daily as needed (gas).    . Probiotic Product (PROBIOTIC DAILY PO) Take by mouth.     Review of Systems PER HPI OTHERWISE ALL SYSTEMS ARE NEGATIVE.     Objective:   Physical Exam  Constitutional: She is oriented to person, place, and time. She appears well-developed and well-nourished. No distress.  HENT:  Head: Normocephalic and atraumatic.  Mouth/Throat: Oropharynx is clear and moist. No oropharyngeal exudate.  Eyes: Pupils are equal, round, and reactive to light. No scleral icterus.  Neck: Normal range of motion. Neck supple.  Cardiovascular: Normal rate, regular rhythm and normal heart sounds.   Pulmonary/Chest: Effort normal and breath sounds normal. No respiratory distress.  Abdominal: Soft. Bowel sounds are normal. She exhibits no distension. There is no tenderness.  Musculoskeletal: She exhibits no edema.  Lymphadenopathy:    She has no cervical adenopathy.  Neurological: She is alert and oriented to person, place, and time.  Psychiatric: She has a normal mood and affect.  Vitals reviewed.     Assessment & Plan:

## 2017-05-31 NOTE — Assessment & Plan Note (Signed)
ASSOCIATED WITH BLEEDING AND RECTAL PAIN. SYMPTOMS INTERMITTENT AND CAN BE CONTROLLED WITH HEMORRHOID CREAM.   EXPLAINED BOWEL IRREGULARITY AND BLEEDING DUE TO POST SURGICAL STATE(NO COLON). PT PREFERS [facility]. CALLED PHARMACY BUT THEY DO NOT COMPOUND. REFILLED APOTHECARY CREAM QID FOR RECAL PAIN/BLEEDING. CONTINUE TO MEDICAL MANAGEMENT. NO ADDITIONAL TREATMENT OPTIONS AVAILABLE. FOLLOW UP IN 4 MOS.

## 2017-05-31 NOTE — Assessment & Plan Note (Signed)
INTERMITTENT AND FAIRLY WELL CONTROLLED WITH IMODIUM AND LOMOTIL.  CONTINUE TO MONITOR SYMPTOMS.

## 2017-05-31 NOTE — Assessment & Plan Note (Signed)
WEIGHT STABLE.  NEED HFP AFTER NEXT VISIT TO CHECK ALBUMIN. LAST CBC JUN 2018: NL.

## 2017-06-01 NOTE — Progress Notes (Signed)
cc'ed to pcp °

## 2017-06-01 NOTE — Progress Notes (Signed)
On recall  °

## 2017-06-12 ENCOUNTER — Ambulatory Visit: Payer: Medicare Other | Admitting: Gastroenterology

## 2017-06-19 ENCOUNTER — Other Ambulatory Visit: Payer: Self-pay | Admitting: Gastroenterology

## 2017-07-03 ENCOUNTER — Other Ambulatory Visit: Payer: Self-pay | Admitting: General Surgery

## 2017-07-03 DIAGNOSIS — Z1231 Encounter for screening mammogram for malignant neoplasm of breast: Secondary | ICD-10-CM

## 2017-07-25 ENCOUNTER — Ambulatory Visit
Admission: RE | Admit: 2017-07-25 | Discharge: 2017-07-25 | Disposition: A | Discharge status: Home / Self Care | Payer: Medicare Other | Source: Ambulatory Visit | Attending: General Surgery | Admitting: General Surgery

## 2017-07-25 DIAGNOSIS — Z1231 Encounter for screening mammogram for malignant neoplasm of breast: Secondary | ICD-10-CM

## 2017-08-29 ENCOUNTER — Encounter: Payer: Self-pay | Admitting: Gastroenterology

## 2017-09-22 ENCOUNTER — Other Ambulatory Visit: Payer: Self-pay | Admitting: Gastroenterology

## 2017-09-26 NOTE — Telephone Encounter (Signed)
Pt said she takes one tablet three times daily.

## 2017-09-26 NOTE — Telephone Encounter (Signed)
Doris, can we see exactly how much Imodium she is taking daily?

## 2017-10-04 ENCOUNTER — Encounter: Payer: Self-pay | Admitting: Gastroenterology

## 2017-10-04 ENCOUNTER — Encounter: Payer: Self-pay | Admitting: General Practice

## 2017-10-04 ENCOUNTER — Ambulatory Visit: Payer: Medicare Other | Admitting: Gastroenterology

## 2017-10-04 DIAGNOSIS — E43 Unspecified severe protein-calorie malnutrition: Secondary | ICD-10-CM | POA: Diagnosis not present

## 2017-10-04 DIAGNOSIS — R634 Abnormal weight loss: Secondary | ICD-10-CM

## 2017-10-04 DIAGNOSIS — C20 Malignant neoplasm of rectum: Secondary | ICD-10-CM | POA: Diagnosis not present

## 2017-10-04 NOTE — Assessment & Plan Note (Signed)
NO WARNING SIGNS/SYMPTOMS  CONTINUE TO MONITOR SYMPTOMS. 

## 2017-10-04 NOTE — Progress Notes (Signed)
Subjective:    Patient ID: Renee Nicholson, female    DOB: 31-Aug-1966, 52 y.o.   MRN: [id number]  [name], MD  HPI MOTHER-IN-LAW PASSED AWAY DEC 12. Feeling a little tired. BOWELS SAME. BOWELS MAKE NOISE. BMs: 4-5/DAY. EATS AND 30  MINS LATER DIARRHEA. USES CREAM FOR RECTAL BURNING AFTER EATING SPICY THINGS.   PT DENIES FEVER, CHILLS, HEMATOCHEZIA, nausea, vomiting, melena, CHEST PAIN, SHORTNESS OF BREATH,  CHANGE IN BOWEL IN HABITS, constipation, abdominal pain, problems swallowing, OR heartburn or indigestion.  Past Medical History:  Diagnosis Date  . Diarrhea   . Glaucoma   . Hx of radiation therapy 08/29/11 to 10/09/11   rectum  . Rectal cancer [facility])     Past Surgical History:  Procedure Laterality Date  . CARPAL TUNNEL RELEASE    . COLONOSCOPY  07/26/2011   Procedure: COLONOSCOPY;  Surgeon: Dorothyann Peng, MD;  [facility];  Service: Endoscopy;  Laterality: N/A;  10:40  . ESOPHAGOGASTRODUODENOSCOPY  07/26/2011   SLF: 1. sessile polyp, multiple 2 internal hemorrhoids 3. rectal mass  . ESOPHAGOGASTRODUODENOSCOPY  12/27/2011   Procedure: ESOPHAGOGASTRODUODENOSCOPY (EGD);  Surgeon: Lear Ng, MD;  [facility];  Service: Endoscopy;  Laterality: N/A;  . EVALUATION UNDER ANESTHESIA WITH ANAL FISSUROTOMY N/A 11/06/2012   Procedure: Exam Under Anesthesia , Anal Dilation;  Surgeon: Stark Klein, MD;  [facility];  Service: General;  Laterality: N/A;  Exam Under Anesthesia , Anal Dilation  . FLEXIBLE SIGMOIDOSCOPY N/A 01/21/2016   Procedure: FLEXIBLE SIGMOIDOSCOPY;  Surgeon: Danie Binder, MD;  [facility];  Service: Endoscopy;  Laterality: N/A;  830   . I&D EXTREMITY Left 05/31/2013   Procedure: IRRIGATION AND DEBRIDEMENT EXTREMITY;  Surgeon: Linna Hoff, MD;  Location: [place];  Service: Orthopedics;  Laterality: Left;  . ILEOSTOMY CLOSURE N/A 04/24/2013   Procedure: ILEOSTOMY TAKEDOWN;  Surgeon: Stark Klein, MD;  [facility];  Service: General;  Laterality: N/A;  . LAPAROSCOPIC COLON RESECTION  12/14/11   diverting ileostomy  . LAPAROTOMY N/A 01/03/2013   Procedure: EXPLORATORY LAPAROTOMY WITH LYSIS OF ADHESIONS, revision of ileostomy;  Surgeon: Stark Klein, MD;  Location: [place];  Service: General;  Laterality: N/A;  . OPEN REDUCTION INTERNAL FIXATION (ORIF) METACARPAL Left 05/31/2013   Procedure: OPEN REDUCTION INTERNAL FIXATION (ORIF) METACARPAL;  Surgeon: Linna Hoff, MD;  Location: [place];  Service: Orthopedics;  Laterality: Left;  . PORT-A-CATH REMOVAL Left 04/29/2014   Procedure: REMOVAL PORT-A-CATH;  Surgeon: Stark Klein, MD;  Location: [place];  Service: General;  Laterality: Left;  . PORTACATH PLACEMENT  02/21/2012   Procedure: INSERTION PORT-A-CATH;  Surgeon: Stark Klein, MD;  Location: [place];  Service: General;  Laterality: N/A;  . TRACHESOTOMY    . TUBAL LIGATION      Allergies  Allergen Reactions  . Paregoric     CHEST PAIN   Current Outpatient Medications  Medication Sig Dispense Refill  . B Complex-C (B-COMPLEX WITH VITAMIN C) tablet Take 1 tablet by mouth daily.    . diphenoxylate-atropine (LOMOTIL) 2.5-0.025 MG tablet Take 1-2 tablets up to four times daily as needed for diarrhea. (Patient taking differently: Take 1-2 tablets 3 times per day) TID   . hydrocortisone cream-nystatin cream-zinc oxide TOP TO AFFECTED AREA TID    . Lidocaine-Hydrocortisone Ace 3-2.5 % KIT APPLY TO RECTUM QID AS NEEDED FOR RECTAL PAIN OR BLEEDING    . loperamide (IMODIUM) 2 MG capsule TAKE 1  CAPSULES three TIMES A DAY FOR DIARRHEA TID   . Multiple Vitamin (MULTIVITAMIN WITH MINERALS) TABS Take 1 tablet by mouth daily.    Marland Kitchen OVER THE COUNTER MEDICATION Take 1 drop by mouth daily as needed (gas).    . Probiotic Product (PROBIOTIC DAILY PO) Take by mouth.    .       Review of Systems PER HPI OTHERWISE ALL SYSTEMS ARE NEGATIVE.    Objective:   Physical Exam  Constitutional: She is oriented to  person, place, and time. She appears well-developed and well-nourished. No distress.  HENT:  Head: Normocephalic and atraumatic.  Mouth/Throat: Oropharynx is clear and moist. No oropharyngeal exudate.  Eyes: Pupils are equal, round, and reactive to light. No scleral icterus.  Neck: Normal range of motion. Neck supple.  Cardiovascular: Normal rate, regular rhythm and normal heart sounds.  Pulmonary/Chest: Effort normal and breath sounds normal. No respiratory distress.  Abdominal: Soft. Bowel sounds are normal. She exhibits no distension. There is no tenderness.  Musculoskeletal: She exhibits no edema.  Lymphadenopathy:    She has no cervical adenopathy.  Neurological: She is alert and oriented to person, place, and time.  NO  NEW FOCAL DEFICITS  Psychiatric:  FLAT AFFECT, SLIGHTLY depressed MOOD, TEARFUL   Vitals reviewed.     Assessment & Plan:

## 2017-10-04 NOTE — Progress Notes (Signed)
ON RECALL  °

## 2017-10-04 NOTE — Patient Instructions (Addendum)
ADD CENTRUM SILVER VITAMIN DAILY.  CONTINUE IMODIUM AND LOMOTIL TO CONTROL WATERY STOOLS.  DRINK BOOST, ENSURE, OR CARNATION INSTANT BREAKFAST FOUR TIMES A DAY TO GAIN WEIGHT.  FOLLOW UP IN 6 MOS.

## 2017-10-04 NOTE — Assessment & Plan Note (Addendum)
WEIGHT STABLE BUT BMI < 19.  ADD CENTRUM SILVER DAILY. CONTINUE IMODIUM AND LOMOTIL TO CONTROL WATERY STOOLS. DRINK BOOST, ENSURE, OR CARNATION INSTANT BREAKFAST FOUR TIMES A DAY TO GAIN WEIGHT. LETTER DRAFTED REGARDING HER CONDITION FOR DISABILITY. FOLLOW UP IN 6 MOS.

## 2017-10-04 NOTE — Assessment & Plan Note (Addendum)
DUE TO POSTSURGIAL ANATOMY AND CHRONIC WATERY DIARRHEA.   ADD CENTRUM SILVER DAILY.  CONTINUE IMODIUM AND LOMOTIL TO CONTROL WATERY STOOLS.  DRINK BOOST, ENSURE, OR CARNATION INSTANT BREAKFAST FOUR TIMES A DAY TO GAIN WEIGHT.  FOLLOW UP IN 6 MOS.

## 2017-10-04 NOTE — Progress Notes (Signed)
CC'D TO PCP °

## 2017-11-14 ENCOUNTER — Telehealth: Payer: Self-pay | Admitting: Gastroenterology

## 2017-11-14 NOTE — Telephone Encounter (Signed)
I received a fax from the patient that she is needing a letter from [facility] to take to her disability hearing. I have clipped the forms to a red folder and placed in [facility] office for her to review. Pt is wanting letter in 10 days.

## 2017-11-14 NOTE — Telephone Encounter (Signed)
THE LETTER WAS WRITTEN & GIVEN TO THE PT ON JAN 10. REPRINT IT ON LETTERHEAD AND HAVE HER PICK IT UP.

## 2017-11-14 NOTE — Telephone Encounter (Signed)
Patient made aware to pick up letter

## 2017-11-14 NOTE — Telephone Encounter (Signed)
Letter reprinted out of Epic and placed at the front desk

## 2018-02-26 ENCOUNTER — Encounter: Payer: Self-pay | Admitting: Gastroenterology

## 2018-03-11 ENCOUNTER — Other Ambulatory Visit: Payer: Self-pay | Admitting: Gastroenterology

## 2018-03-15 ENCOUNTER — Other Ambulatory Visit: Payer: Self-pay

## 2018-03-15 ENCOUNTER — Telehealth: Payer: Self-pay

## 2018-03-15 NOTE — Telephone Encounter (Signed)
I have started a PA for Lomotil.

## 2018-03-18 NOTE — Telephone Encounter (Signed)
PA approved for the Lomotil. Member # [id number]. Good through 03/16/2019. Faxed to [employer].

## 2018-03-22 ENCOUNTER — Telehealth: Payer: Self-pay | Admitting: Nurse Practitioner

## 2018-03-22 ENCOUNTER — Encounter: Payer: Self-pay | Admitting: Nurse Practitioner

## 2018-03-22 ENCOUNTER — Inpatient Hospital Stay: Payer: Medicare Other

## 2018-03-22 ENCOUNTER — Inpatient Hospital Stay: Payer: Medicare Other | Attending: Nurse Practitioner | Admitting: Nurse Practitioner

## 2018-03-22 VITALS — BP 139/79 | HR 60 | Temp 98.3°F | Resp 18 | Ht <= 58 in | Wt 89.8 lb

## 2018-03-22 DIAGNOSIS — R197 Diarrhea, unspecified: Secondary | ICD-10-CM | POA: Diagnosis not present

## 2018-03-22 DIAGNOSIS — C20 Malignant neoplasm of rectum: Secondary | ICD-10-CM

## 2018-03-22 LAB — CEA (IN HOUSE-CHCC)

## 2018-03-22 NOTE — Telephone Encounter (Signed)
Scheduled appt per 6/28 los - gave patient AVS and calender per los.  
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
## 2018-04-18 ENCOUNTER — Telehealth: Payer: Self-pay | Admitting: Gastroenterology

## 2018-04-18 NOTE — Telephone Encounter (Signed)
Pt's husband had brought papers for [facility] to fill out for patient. He was checking to see if they were ready to be picked up. Per Erline Levine the forms are still in [facility] office. I told the husband that I would call when forms were ready.

## 2018-04-26 ENCOUNTER — Telehealth: Payer: Self-pay | Admitting: Gastroenterology

## 2018-04-26 NOTE — Telephone Encounter (Signed)
PT's husband is aware.  

## 2018-04-26 NOTE — Telephone Encounter (Signed)
Pt's husband called back asking if the papers he left here for [facility] had been done yet. I told him that I would remind her again that the papers are in her office and we would call him when they're ready. He said he needed them ASAP.

## 2018-04-26 NOTE — Telephone Encounter (Signed)
PLEASE CALL PT. He can pick the letter up MON AUG 5 after 12n.

## 2018-05-20 ENCOUNTER — Other Ambulatory Visit: Payer: Self-pay | Admitting: Gastroenterology

## 2018-05-30 ENCOUNTER — Ambulatory Visit: Payer: Medicare Other | Admitting: Gastroenterology

## 2018-06-04 ENCOUNTER — Telehealth: Payer: Self-pay | Admitting: Gastroenterology

## 2018-06-04 NOTE — Telephone Encounter (Signed)
Called patient and she will be at appointment

## 2018-06-04 NOTE — Telephone Encounter (Signed)
URGENT APPT SEP 11 @1130  Dx: RECTAL CANCER, DIARRHEA.

## 2018-06-05 ENCOUNTER — Encounter

## 2018-06-05 ENCOUNTER — Other Ambulatory Visit (HOSPITAL_COMMUNITY)
Admission: RE | Admit: 2018-06-05 | Discharge: 2018-06-05 | Disposition: A | Discharge status: Home / Self Care | Payer: Medicare Other | Source: Ambulatory Visit | Attending: Gastroenterology | Admitting: Gastroenterology

## 2018-06-05 ENCOUNTER — Ambulatory Visit: Payer: Medicare Other | Admitting: Gastroenterology

## 2018-06-05 ENCOUNTER — Ambulatory Visit (HOSPITAL_COMMUNITY)
Admission: RE | Admit: 2018-06-05 | Discharge: 2018-06-05 | Disposition: A | Discharge status: Home / Self Care | Payer: Medicare Other | Source: Ambulatory Visit | Attending: Gastroenterology | Admitting: Gastroenterology

## 2018-06-05 ENCOUNTER — Encounter: Payer: Self-pay | Admitting: Gastroenterology

## 2018-06-05 DIAGNOSIS — K598 Other specified functional intestinal disorders: Secondary | ICD-10-CM | POA: Diagnosis not present

## 2018-06-05 DIAGNOSIS — R1013 Epigastric pain: Secondary | ICD-10-CM

## 2018-06-05 LAB — COMPREHENSIVE METABOLIC PANEL
ALT: 18 U/L (ref 0–44)
ANION GAP: 8 (ref 5–15)
AST: 19 U/L (ref 15–41)
Albumin: 3.9 g/dL (ref 3.5–5.0)
Alkaline Phosphatase: 68 U/L (ref 38–126)
BILIRUBIN TOTAL: 0.3 mg/dL (ref 0.3–1.2)
BUN: 22 mg/dL — AB (ref 6–20)
CHLORIDE: 103 mmol/L (ref 98–111)
CO2: 26 mmol/L (ref 22–32)
Calcium: 9.1 mg/dL (ref 8.9–10.3)
Creatinine, Ser: 0.61 mg/dL (ref 0.44–1.00)
GFR calc Af Amer: >60 mL/min (ref >60)
Glucose, Bld: 104 mg/dL — ABNORMAL HIGH (ref 70–99)
Potassium: 4.4 mmol/L (ref 3.5–5.1)
Sodium: 137 mmol/L (ref 135–145)
TOTAL PROTEIN: 7 g/dL (ref 6.5–8.1)

## 2018-06-05 LAB — URINALYSIS, COMPLETE (UACMP) WITH MICROSCOPIC
BILIRUBIN URINE: NEGATIVE
Glucose, UA: NEGATIVE mg/dL
Ketones, ur: NEGATIVE mg/dL
Leukocytes, UA: NEGATIVE
NITRITE: NEGATIVE
PROTEIN: NEGATIVE mg/dL
SPECIFIC GRAVITY, URINE: 1.02 (ref 1.005–1.030)
pH: 5 (ref 5.0–8.0)

## 2018-06-05 LAB — CBC WITH DIFFERENTIAL/PLATELET
BASOS ABS: 0 10*3/uL (ref 0.0–0.1)
BASOS PCT: 0 %
Eosinophils Absolute: 0.2 10*3/uL (ref 0.0–0.7)
Eosinophils Relative: 3 %
HEMATOCRIT: 41.6 % (ref 36.0–46.0)
HEMOGLOBIN: 14 g/dL (ref 12.0–15.0)
LYMPHS PCT: 32 %
Lymphs Abs: 1.9 10*3/uL (ref 0.7–4.0)
MCH: 29.9 pg (ref 26.0–34.0)
MCHC: 33.7 g/dL (ref 30.0–36.0)
MCV: 88.9 fL (ref 78.0–100.0)
MONO ABS: 0.7 10*3/uL (ref 0.1–1.0)
Monocytes Relative: 11 %
NEUTROS ABS: 3.3 10*3/uL (ref 1.7–7.7)
Neutrophils Relative %: 54 %
Platelets: 240 10*3/uL (ref 150–400)
RBC: 4.68 MIL/uL (ref 3.87–5.11)
RDW: 12.6 % (ref 11.5–15.5)
WBC: 6 10*3/uL (ref 4.0–10.5)

## 2018-06-05 LAB — LIPASE, BLOOD: LIPASE: 58 U/L — AB (ref 11–51)

## 2018-06-05 NOTE — Progress Notes (Signed)
cc'ed to pcp °

## 2018-06-05 NOTE — Assessment & Plan Note (Addendum)
ACUTE IN ONSET AND WORSE WITH EATING/PLANE RIDE AND ASSOCIATED WITH VOMITING x1. DIFFERENTIAL DIAGNOSIS INCLUDES: PARTIAL SBO, UTI, LESS LIKELY RECTAL CANCER METS, OR GOO. WEIGHT STABLE 87-89 LBS SINCE 2018.  BOOST WITH RICE TID. STAT KUB 2 VIEW TODAY. UGI/SBFT WITHIN 24-48 HRS. CONSIDER CT IF NO SOURCE FOR ABDOMINAL PAIN/ANOREXIA IDENTIFIED. USE LOMOTIL AND IMODIUM WHEN NEEDED TO CONTROL DIARRHEA. CHECK CMP/CBC/UA WITH MICRO TODAY. FOLLOW UP IN 1 MO.

## 2018-06-05 NOTE — Telephone Encounter (Signed)
REVIEWED-NO ADDITIONAL RECOMMENDATIONS. 

## 2018-06-05 NOTE — Patient Instructions (Addendum)
  DRINK WATER TO KEEP YOUR URINE LIGHT YELLOW.  DRINK BOOST WITH RICE THREE TIMES A DAY.  USE LOMOTIL AND IMODIUM WHEN NEEDED TO CONTROL DIARRHEA.   COMPLETE PLAIN XRAY TODAY AND upper GI series WITH small bowel follow-through WITHIN 24-48 HRS. WE WILL CONSIDER A CT SCAN IF NO SOURCE FOR YOUR ABDOMINAL PAIN/ANOREXIA IS IDENTIFIED.  CHECK CMP/CBC ND SUBMIT URINE SAMPLE TODAY.  FOLLOW UP IN 1 MO.

## 2018-06-05 NOTE — Progress Notes (Signed)
PATIENT SCHEDULED  °

## 2018-06-05 NOTE — Progress Notes (Signed)
Subjective:    Patient ID: Renee Nicholson, female    DOB: 1966-05-12, 52 y.o.   MRN: [id number]  [name], MD  HPI SEEN AS URGENT OPV TODAY. WENT OUT OF TOWN([place]YS[place] MISSED THE [place] & DIDN'T EAT MUCH. GOT BACK SUN NIGHT. ABDOMINAL PAIN/GAS-TOP OF ABDOMEN) AND STOMACH HURT A LOT. BARELY EATING. EVERY TIME SHE ATE SHE THREW UP. AND THE NEXT DAY DIARRHEA ALL DAY(SEVERAL TIMES-SMALL). UNABLE TO DRINK BOOST.  DIDN'T TAKE ANY MEDICINE. CAN EAT A LITTLE BETTER. HASN'T TRIED EATING A LOT. FEELS FULL QUICKLY AND FEELS WEAK. LAST VOMITED: SAT. ONLY VOMITED/GAS PROBLEM X1. WATERY STOOL AND COULDN'T TAKE DIARRHEA PILLS. NO NAUSEA. NOW BACK TO TWO BOOST A DAY. EATING RICE AND BREAD. NOT HURTING RIGHT NOW.  PT DENIES FEVER, CHILLS, HEMATOCHEZIA, HEMATEMESIS, nausea, vomiting, melena, CHEST PAIN, SHORTNESS OF BREATH, CHANGE IN BOWEL IN HABITS, constipation, problems swallowing, OR heartburn or indigestion.  Past Medical History:  Diagnosis Date  . Diarrhea   . Glaucoma   . Hx of radiation therapy 08/29/11 to 10/09/11   rectum  . Rectal cancer [facility])    Past Surgical History:  Procedure Laterality Date  . CARPAL TUNNEL RELEASE    . COLONOSCOPY  07/26/2011   Procedure: COLONOSCOPY;  Surgeon: Dorothyann Peng, MD;  [facility];  Service: Endoscopy;  Laterality: N/A;  10:40  . ESOPHAGOGASTRODUODENOSCOPY  07/26/2011   SLF: 1. sessile polyp, multiple 2 internal hemorrhoids 3. rectal mass  . ESOPHAGOGASTRODUODENOSCOPY  12/27/2011   Procedure: ESOPHAGOGASTRODUODENOSCOPY (EGD);  Surgeon: Lear Ng, MD;  [facility];  Service: Endoscopy;  Laterality: N/A;  . EVALUATION UNDER ANESTHESIA WITH ANAL FISSUROTOMY N/A 11/06/2012   Procedure: Exam Under Anesthesia , Anal Dilation;  Surgeon: Stark Klein, MD;  [facility];  Service: General;  Laterality: N/A;  Exam Under Anesthesia , Anal Dilation  . FLEXIBLE SIGMOIDOSCOPY N/A 01/21/2016   Procedure:  FLEXIBLE SIGMOIDOSCOPY;  Surgeon: Danie Binder, MD;  [facility];  Service: Endoscopy;  Laterality: N/A;  830   . I&D EXTREMITY Left 05/31/2013   Procedure: IRRIGATION AND DEBRIDEMENT EXTREMITY;  Surgeon: Linna Hoff, MD;  Location: [place];  Service: Orthopedics;  Laterality: Left;  . ILEOSTOMY CLOSURE N/A 04/24/2013   Procedure: ILEOSTOMY TAKEDOWN;  Surgeon: Stark Klein, MD;  [facility];  Service: General;  Laterality: N/A;  . LAPAROSCOPIC COLON RESECTION  12/14/11   diverting ileostomy  . LAPAROTOMY N/A 01/03/2013   Procedure: EXPLORATORY LAPAROTOMY WITH LYSIS OF ADHESIONS, revision of ileostomy;  Surgeon: Stark Klein, MD;  Location: [place];  Service: General;  Laterality: N/A;  . OPEN REDUCTION INTERNAL FIXATION (ORIF) METACARPAL Left 05/31/2013   Procedure: OPEN REDUCTION INTERNAL FIXATION (ORIF) METACARPAL;  Surgeon: Linna Hoff, MD;  Location: [place];  Service: Orthopedics;  Laterality: Left;  . PORT-A-CATH REMOVAL Left 04/29/2014   Procedure: REMOVAL PORT-A-CATH;  Surgeon: Stark Klein, MD;  Location: [place];  Service: General;  Laterality: Left;  . PORTACATH PLACEMENT  02/21/2012   Procedure: INSERTION PORT-A-CATH;  Surgeon: Stark Klein, MD;  Location: [place];  Service: General;  Laterality: N/A;  . TRACHESOTOMY    . TUBAL LIGATION     Allergies  Allergen Reactions  . Paregoric     CHEST PAIN   Current Outpatient Medications  Medication Sig    . PROBIOTIC DAILY    .      .      .      .      .      .      .      Marland Kitchen  Review of Systems PER HPI OTHERWISE ALL SYSTEMS ARE NEGATIVE.    Objective:   Physical Exam  Constitutional: She is oriented to person, place, and time. She appears well-developed and well-nourished. No distress.  HENT:  Head: Normocephalic and atraumatic.  Mouth/Throat: Oropharynx is clear and moist. No oropharyngeal exudate.  Eyes: Pupils are equal, round, and reactive to light. No scleral icterus.  Neck: Normal  range of motion. Neck supple.  Cardiovascular: Normal rate, regular rhythm and normal heart sounds.  Pulmonary/Chest: Effort normal and breath sounds normal. No respiratory distress.  Abdominal: Soft. She exhibits distension (MILD). There is no tenderness. There is no rebound and no guarding.  HYPERACTIVE BOWEL SOUNDS  Musculoskeletal: She exhibits no edema.  Lymphadenopathy:    She has no cervical adenopathy.  Neurological: She is alert and oriented to person, place, and time.  NO  NEW FOCAL DEFICITS  Psychiatric: She has a normal mood and affect.  Vitals reviewed.     Assessment & Plan:

## 2018-06-06 NOTE — Progress Notes (Signed)
Called patient TO DISCUSS RESULTS. I PERSONALLY REVIEWED THE KUB WITH DR. Pascal Lux. DISCUSSED RESULTS WITH PT. KUB: NO OBSTRUCTION. LABS: NORMAL. PT SHOULD CALL IF HER SYMPTOMS GET WORSE AND WE WILL PROCEED WITH CT ABD/PELVIS. OTHERWISE, ADVANCED DIET AND SHE SHOULD CALL WITH QUESTIONS OR CONCERNS.

## 2018-06-12 ENCOUNTER — Ambulatory Visit: Payer: Medicare Other | Admitting: Gastroenterology

## 2018-06-27 ENCOUNTER — Other Ambulatory Visit: Payer: Self-pay | Admitting: General Surgery

## 2018-06-27 DIAGNOSIS — Z1231 Encounter for screening mammogram for malignant neoplasm of breast: Secondary | ICD-10-CM

## 2018-07-03 ENCOUNTER — Encounter: Payer: Self-pay | Admitting: Gastroenterology

## 2018-07-03 ENCOUNTER — Ambulatory Visit: Payer: Medicare Other | Admitting: Gastroenterology

## 2018-07-03 DIAGNOSIS — K6289 Other specified diseases of anus and rectum: Secondary | ICD-10-CM | POA: Diagnosis not present

## 2018-07-03 DIAGNOSIS — K591 Functional diarrhea: Secondary | ICD-10-CM | POA: Diagnosis not present

## 2018-07-03 NOTE — Progress Notes (Signed)
cc'd to pcp 

## 2018-07-03 NOTE — Patient Instructions (Signed)
DRINK WATER TO KEEP YOUR URINE LIGHT YELLOW.  Use Lomotil three times a day to control stools.  USE IMODIUM IF NEEDED. HOLD FOR CONSTIPATION.  FOLLOW UP IN 6 MOS. WE CAN CONSIDER A FLEX SIG IN APR 2020.

## 2018-07-03 NOTE — Assessment & Plan Note (Signed)
SYMPTOMS CONTROLLED/RESOLVED.  CONTINUE TO MONITOR SYMPTOMS. 

## 2018-07-03 NOTE — Progress Notes (Signed)
ON RECALL  °

## 2018-07-03 NOTE — Assessment & Plan Note (Signed)
W/ rare BRBPR AND KNOWN  HISTORY OF ANAL STRICTURE. SYMPTOMS FAIRLY WELL CONTROLLED.  DRINK WATER TO KEEP YOUR URINE LIGHT YELLOW. Use Lomotil three times a day to control stools. USE IMODIUM IF NEEDED. HOLD FOR CONSTIPATION. FOLLOW UP IN 6 MOS. WE CAN CONSIDER A FLEX SIG IN APR 2020 IF BRBPR CONTINUES.

## 2018-07-03 NOTE — Progress Notes (Signed)
Subjective:    Patient ID: Renee Nicholson, female    DOB: [birth year], 52 y.o.   MRN: [id number]  [name], MD   HPI Son has a quality inn hotel in [place]. She will be MOVING TO [place]. PLANNING ON MOVING AFTER SHE SELLS HER MOTEL. HAVING #4 AND USING LOMOTIL 3 A DAY. RECTAL PAIN WORSE WHEN IT WAS HOT AND NOW OK.NO SPICY FOODS. GAINED 4 LBS SINCE JAN 2019. MAY SEE  A LITTLE ON THE TISSUE. NO DIARRHEA SINCE [place].  PT DENIES FEVER, CHILLS, HEMATOCHEZIA, HEMATEMESIS, nausea, vomiting, melena, CHEST PAIN, SHORTNESS OF BREATH, CHANGE IN BOWEL IN HABITS, constipation, abdominal pain, problems swallowing, OR heartburn or indigestion.  Past Medical History:  Diagnosis Date  . Diarrhea   . Glaucoma   . Hx of radiation therapy 08/29/11 to 10/09/11   rectum  . Rectal cancer [facility])     Past Surgical History:  Procedure Laterality Date  . CARPAL TUNNEL RELEASE    . COLONOSCOPY  07/26/2011   Procedure: COLONOSCOPY;  Surgeon: Dorothyann Peng, MD;  [facility];  Service: Endoscopy;  Laterality: N/A;  10:40  . ESOPHAGOGASTRODUODENOSCOPY  07/26/2011   [facility]: 1. sessile polyp, multiple 2 internal hemorrhoids 3. rectal mass  . ESOPHAGOGASTRODUODENOSCOPY  12/27/2011   Procedure: ESOPHAGOGASTRODUODENOSCOPY (EGD);  Surgeon: Lear Ng, MD;  [facility];  Service: Endoscopy;  Laterality: N/A;  . EVALUATION UNDER ANESTHESIA WITH ANAL FISSUROTOMY N/A 11/06/2012   Procedure: Exam Under Anesthesia , Anal Dilation;  Surgeon: Stark Klein, MD;  [facility];  Service: General;  Laterality: N/A;  Exam Under Anesthesia , Anal Dilation  . FLEXIBLE SIGMOIDOSCOPY N/A 01/21/2016   Procedure: FLEXIBLE SIGMOIDOSCOPY;  Surgeon: Danie Binder, MD;  [facility];  Service: Endoscopy;  Laterality: N/A;  830   . I&D EXTREMITY Left 05/31/2013   Procedure: IRRIGATION AND DEBRIDEMENT EXTREMITY;  Surgeon: Linna Hoff, MD;  Location: [place];  Service: Orthopedics;  Laterality:  Left;  . ILEOSTOMY CLOSURE N/A 04/24/2013   Procedure: ILEOSTOMY TAKEDOWN;  Surgeon: Stark Klein, MD;  [facility];  Service: General;  Laterality: N/A;  . LAPAROSCOPIC COLON RESECTION  12/14/11   diverting ileostomy  . LAPAROTOMY N/A 01/03/2013   Procedure: EXPLORATORY LAPAROTOMY WITH LYSIS OF ADHESIONS, revision of ileostomy;  Surgeon: Stark Klein, MD;  Location: [place];  Service: General;  Laterality: N/A;  . OPEN REDUCTION INTERNAL FIXATION (ORIF) METACARPAL Left 05/31/2013   Procedure: OPEN REDUCTION INTERNAL FIXATION (ORIF) METACARPAL;  Surgeon: Linna Hoff, MD;  Location: [place];  Service: Orthopedics;  Laterality: Left;  . PORT-A-CATH REMOVAL Left 04/29/2014   Procedure: REMOVAL PORT-A-CATH;  Surgeon: Stark Klein, MD;  Location: [place];  Service: General;  Laterality: Left;  . PORTACATH PLACEMENT  02/21/2012   Procedure: INSERTION PORT-A-CATH;  Surgeon: Stark Klein, MD;  Location: [place];  Service: General;  Laterality: N/A;  . TRACHESOTOMY    . TUBAL LIGATION      Allergies  Allergen Reactions  . Paregoric     CHEST PAIN   Current Outpatient Medications  Medication Sig    . diphenoxylate-atropine (LOMOTIL) 2.5-0.025 MG tablet Take 1-2 tablets 3 times per day    . hydrocortisone cream-nystatin cream-zinc oxide TOP TO AFFECTED AREA TID (Patient taking differently: as needed. TOP TO AFFECTED AREA TID)    .      . loperamide (IMODIUM) 2 MG capsule TAKE 1 CAPSULE BY MOUTH THREE TIMES A DAY FOR  DIARRHEA    . Multiple Vitamin (MULTIVITAMIN WITH MINERALS) TABS Take 1 tablet by mouth daily.    Marland Kitchen OVER THE COUNTER MEDICATION Take 1 drop by mouth daily as needed (gas).    . Probiotic Product (PROBIOTIC DAILY PO) Take by mouth.    .      .       Review of Systems PER HPI OTHERWISE ALL SYSTEMS ARE NEGATIVE.    Objective:   Physical Exam  Constitutional: She is oriented to person, place, and time. She appears well-developed and well-nourished. No distress.  HENT:   Head: Normocephalic and atraumatic.  Mouth/Throat: Oropharynx is clear and moist. No oropharyngeal exudate.  Eyes: Pupils are equal, round, and reactive to light. No scleral icterus.  Neck: Normal range of motion. Neck supple.  Cardiovascular: Normal rate, regular rhythm and normal heart sounds.  Pulmonary/Chest: Effort normal and breath sounds normal. No respiratory distress.  Abdominal: Soft. Bowel sounds are normal. She exhibits no distension. There is no tenderness.  Musculoskeletal: She exhibits no edema.  Lymphadenopathy:    She has no cervical adenopathy.  Neurological: She is alert and oriented to person, place, and time.  Psychiatric: She has a normal mood and affect.  Vitals reviewed.     Assessment & Plan:

## 2018-09-04 ENCOUNTER — Ambulatory Visit
Admission: RE | Admit: 2018-09-04 | Discharge: 2018-09-04 | Disposition: A | Discharge status: Home / Self Care | Payer: Medicare Other | Source: Ambulatory Visit | Attending: General Surgery | Admitting: General Surgery

## 2018-09-04 DIAGNOSIS — Z1231 Encounter for screening mammogram for malignant neoplasm of breast: Secondary | ICD-10-CM

## 2018-12-03 ENCOUNTER — Encounter: Payer: Self-pay | Admitting: Gastroenterology

## 2019-02-19 ENCOUNTER — Other Ambulatory Visit: Payer: Self-pay | Admitting: *Deleted

## 2019-02-19 MED ORDER — DIPHENOXYLATE-ATROPINE 2.5-0.025 MG PO TABS: ORAL_TABLET | ORAL | 0 refills | Status: DC

## 2019-02-19 NOTE — Telephone Encounter (Signed)
Shela Leff in [place] called requesting new Rx for pt lomotil. Phone # [phone].

## 2019-02-19 NOTE — Telephone Encounter (Signed)
rx done

## 2019-02-19 NOTE — Addendum Note (Signed)
Addended by: Mahala Menghini on: 02/19/2019 07:04 PM   Modules accepted: Orders

## 2019-03-21 ENCOUNTER — Telehealth: Payer: Self-pay

## 2019-03-21 NOTE — Telephone Encounter (Signed)
Attempted to reach patient about appointments on 6/29. Phone rang and went to busy tone. Was unable to leave a vm

## 2019-03-24 ENCOUNTER — Telehealth: Payer: Self-pay

## 2019-03-24 ENCOUNTER — Other Ambulatory Visit: Payer: Self-pay | Admitting: *Deleted

## 2019-03-24 ENCOUNTER — Inpatient Hospital Stay: Payer: Medicare Other | Admitting: Oncology

## 2019-03-24 ENCOUNTER — Inpatient Hospital Stay: Payer: Medicare Other

## 2019-03-24 ENCOUNTER — Telehealth: Payer: Self-pay | Admitting: Oncology

## 2019-03-24 DIAGNOSIS — C20 Malignant neoplasm of rectum: Secondary | ICD-10-CM

## 2019-03-24 NOTE — Telephone Encounter (Signed)
Scheduled appt per 6/29 sch message- no answer , mailed letter with appt date and time

## 2019-03-24 NOTE — Telephone Encounter (Signed)
Stacey please call pt's spouse to schedule a telephone appointment with [facility]. Pt is helping with her son out for one month in [place] and isn't in [place]. Pt also has new insurance that they will provided when our office returns pt's call.   RGA RefillPt needs a refill on Hc 2.5 % cr, Loperamide and Lomotil sent to [facility], [place]. Pharmacy.

## 2019-03-25 ENCOUNTER — Telehealth: Payer: Self-pay | Admitting: *Deleted

## 2019-03-25 NOTE — Telephone Encounter (Signed)
CALLED HUSBAND AND MADE PHONE APPOINTMENT FOR [place]

## 2019-03-25 NOTE — Telephone Encounter (Signed)
Pt wants to update insurance information.  Can you call the pt at [phone]?

## 2019-03-26 NOTE — Telephone Encounter (Signed)
SPOKE TO PATIENT HUSBAND A FEW DAYS AGO AND INSURANCE SHOULD BE UPDATED IN SYSTEM

## 2019-03-31 MED ORDER — HYDROCORTISONE (PERIANAL) 2.5 % EX CREA: 1.0000 "application " | TOPICAL_CREAM | Freq: Two times a day (BID) | CUTANEOUS | 1 refills | Status: AC

## 2019-03-31 MED ORDER — LOPERAMIDE HCL 2 MG PO CAPS: ORAL_CAPSULE | ORAL | 3 refills | Status: DC

## 2019-03-31 NOTE — Addendum Note (Signed)
Addended by: Annitta Needs on: 03/31/2019 09:29 AM   Modules accepted: Orders

## 2019-04-22 ENCOUNTER — Other Ambulatory Visit: Payer: Medicare PPO

## 2019-04-22 ENCOUNTER — Telehealth: Payer: Self-pay | Admitting: Oncology

## 2019-04-22 ENCOUNTER — Ambulatory Visit: Payer: Medicare PPO | Admitting: Oncology

## 2019-04-22 NOTE — Telephone Encounter (Signed)
Scheduled appt per 7/28 sch message - pt aware of new appt date and time

## 2019-05-14 ENCOUNTER — Encounter: Payer: Self-pay | Admitting: Gastroenterology

## 2019-05-14 ENCOUNTER — Ambulatory Visit (INDEPENDENT_AMBULATORY_CARE_PROVIDER_SITE_OTHER): Payer: Medicare PPO | Admitting: Gastroenterology

## 2019-05-14 ENCOUNTER — Other Ambulatory Visit: Payer: Self-pay

## 2019-05-14 DIAGNOSIS — C20 Malignant neoplasm of rectum: Secondary | ICD-10-CM | POA: Diagnosis not present

## 2019-05-14 DIAGNOSIS — K591 Functional diarrhea: Secondary | ICD-10-CM

## 2019-05-14 MED ORDER — DIPHENOXYLATE-ATROPINE 2.5-0.025 MG PO TABS: ORAL_TABLET | ORAL | 3 refills | Status: AC

## 2019-05-14 MED ORDER — LOPERAMIDE HCL 2 MG PO CAPS: ORAL_CAPSULE | ORAL | 3 refills | Status: AC

## 2019-05-14 MED ORDER — LIDOCAINE-HYDROCORTISONE ACE 3-2.5 % RE KIT: PACK | RECTAL | 5 refills | Status: AC

## 2019-05-14 NOTE — Assessment & Plan Note (Signed)
SYMPTOMS FAIRLY WELL CONTROLLED.  DRINK WATER TO KEEP YOUR URINE LIGHT YELLOW. USE IMODIUM AND LOMOTIL WHEN NEEDED TO CONTROL DIARRHEA. REFILLS SENT FOR 1 YEAR. USE RECTAL CREAM WHEN NEEDED TO CONTROL RECTAL PAIN. REFILLS SENT FOR ONE YEAR. FOLLOW UP IN 6 MOS.

## 2019-05-14 NOTE — Assessment & Plan Note (Signed)
  NO WARNING SIGNS/SYMPTOMS  WILL FAX ORDERS FOR LABS TO [facility]: [phone]. NEEDS COMPLETE METABOLIC PANEL AND COMPLETE BLOOD COUNT.  FOLLOW UP IN 6 MOS.

## 2019-05-14 NOTE — Patient Instructions (Signed)
DRINK WATER TO KEEP YOUR URINE LIGHT YELLOW.  USE IMODIUM AND LOMOTIL WHEN NEEDED TO CONTROL DIARRHEA. REFILLS SENT FOR 1 YEAR.  USE RECTAL CREAM WHEN NEEDED TO CONTROL RECTAL PAIN. REFILLS SENT FOR ONE YEAR.  WILL FAX ORDERS FOR LABS [facility]: [phone]. NEEDS COMPLETE METABOLIC PANEL AND COMPLETE BLOOD COUNT.  FOLLOW UP IN 6 MOS.

## 2019-05-14 NOTE — Progress Notes (Signed)
Subjective:    Patient ID: Ginnie Smart, female    DOB: Jan 30, 1966, 53 y.o.   MRN: [id number]   Primary Care Physician:  [place]  Primary GI:  Barney Drain, MD   Patient Location: home   Provider Location: [facility] office   Reason for Visit: DIARRHEA   Persons present on the virtual encounter, with roles: patient, myself (provider), MARTINA BOOTH CMA (update meds/allergies)   Total time (minutes) spent on medical discussion: 17 MINUTES   Due to COVID-19, visit was VIA TELEPHONE VISIT DUE TO COVID 19. VISIT IS CONDUCTED VIRTUALLY AND WAS REQUESTED BY PATIENT.   Virtual Visit via TELEPHONE   I connected with  Brandii Martinezgarcia  and verified that I am speaking with the correct person using two identifiers.   I discussed the limitations, risks, security and privacy concerns of performing an evaluation and management service by telephone/video and the availability of in person appointments. I also discussed with the patient that there may be a patient responsible charge related to this service. The patient expressed understanding and agreed to proceed.  HPI BMs:4-5/DAY.(LOOSE OR FORMED). USING IMODIUM: 3. LOMOTIL:3. RECTAL CREAM: AFTERNOON: 2X PRN IF EATS SPICY FOOD. LAST FLEX SIG WAS 2017.  PT DENIES FEVER, CHILLS, HEMATOCHEZIA, HEMATEMESIS, nausea, vomiting, melena, diarrhea, CHEST PAIN, SHORTNESS OF BREATH,  CHANGE IN BOWEL IN HABITS, constipation, abdominal pain, problems swallowing,OR heartburn or indigestion.  Past Medical History:  Diagnosis Date  . Diarrhea   . Glaucoma   . Hx of radiation therapy 08/29/11 to 10/09/11   rectum  . Rectal cancer [facility])     Past Surgical History:  Procedure Laterality Date  . CARPAL TUNNEL RELEASE    . COLONOSCOPY  07/26/2011   Procedure: COLONOSCOPY;  Surgeon: Dorothyann Peng, MD;  [facility];  Service: Endoscopy;  Laterality: N/A;  10:40  . ESOPHAGOGASTRODUODENOSCOPY  07/26/2011   [facility]: 1. sessile polyp, multiple 2 internal  hemorrhoids 3. rectal mass  . ESOPHAGOGASTRODUODENOSCOPY  12/27/2011   Procedure: ESOPHAGOGASTRODUODENOSCOPY (EGD);  Surgeon: Lear Ng, MD;  [facility];  Service: Endoscopy;  Laterality: N/A;  . EVALUATION UNDER ANESTHESIA WITH ANAL FISSUROTOMY N/A 11/06/2012   Procedure: Exam Under Anesthesia , Anal Dilation;  Surgeon: Stark Klein, MD;  [facility];  Service: General;  Laterality: N/A;  Exam Under Anesthesia , Anal Dilation  . FLEXIBLE SIGMOIDOSCOPY N/A 01/21/2016   Procedure: FLEXIBLE SIGMOIDOSCOPY;  Surgeon: Danie Binder, MD;  [facility];  Service: Endoscopy;  Laterality: N/A;  830   . I&D EXTREMITY Left 05/31/2013   Procedure: IRRIGATION AND DEBRIDEMENT EXTREMITY;  Surgeon: Linna Hoff, MD;  Location: [place];  Service: Orthopedics;  Laterality: Left;  . ILEOSTOMY CLOSURE N/A 04/24/2013   Procedure: ILEOSTOMY TAKEDOWN;  Surgeon: Stark Klein, MD;  [facility];  Service: General;  Laterality: N/A;  . LAPAROSCOPIC COLON RESECTION  12/14/11   diverting ileostomy  . LAPAROTOMY N/A 01/03/2013   Procedure: EXPLORATORY LAPAROTOMY WITH LYSIS OF ADHESIONS, revision of ileostomy;  Surgeon: Stark Klein, MD;  Location: [place];  Service: General;  Laterality: N/A;  . OPEN REDUCTION INTERNAL FIXATION (ORIF) METACARPAL Left 05/31/2013   Procedure: OPEN REDUCTION INTERNAL FIXATION (ORIF) METACARPAL;  Surgeon: Linna Hoff, MD;  Location: [place];  Service: Orthopedics;  Laterality: Left;  . PORT-A-CATH REMOVAL Left 04/29/2014   Procedure: REMOVAL PORT-A-CATH;  Surgeon: Stark Klein, MD;  Location: [place];  Service: General;  Laterality: Left;  . PORTACATH PLACEMENT  02/21/2012   Procedure: INSERTION PORT-A-CATH;  Surgeon: Stark Klein, MD;  Location: [place];  Service: General;  Laterality: N/A;  . TRACHESOTOMY    . TUBAL LIGATION      Allergies  Allergen Reactions  . Paregoric     CHEST PAIN   Current Outpatient Medications  Medication Sig     . diphenoxylate-atropine (LOMOTIL) 2.5-0.025 MG tablet Take 1-2 tablets orally 3 times per day for diarrhea    . hydrocortisone (ANUSOL-HC) 2.5 % rectal cream Place 1 application rectally 2 (two) times daily.    . hydrocortisone cream-nystatin cream-zinc oxide TOP TO AFFECTED AREA TID (Patient taking differently: as needed. TOP TO AFFECTED AREA TID)    .      . loperamide (IMODIUM) 2 MG capsule TAKE 1 CAPSULE BY MOUTH THREE TIMES A DAY FOR DIARRHEA    . Multiple Vitamin (MULTIVITAMIN WITH MINERALS) TABS Take 1 tablet by mouth daily.    . Probiotic Product (PROBIOTIC DAILY PO) Take by mouth.    . B Complex-C (B-COMPLEX WITH VITAMIN C) tablet Take 1 tablet by mouth daily. (Patient not taking: Reported on 06/05/2018)    .      Marland Kitchen OVER THE COUNTER MEDICATION Take 1 drop by mouth daily as needed (gas).     Review of Systems PER HPI OTHERWISE ALL SYSTEMS ARE NEGATIVE.    Objective:   Physical Exam  VIRTUAL VISIT PER REQUEST DUE TO COVID 19 RESTRICTIONS.    Assessment & Plan:

## 2019-05-15 NOTE — Progress Notes (Signed)
ON RECALL  °

## 2019-05-16 ENCOUNTER — Telehealth: Payer: Self-pay

## 2019-05-16 NOTE — Telephone Encounter (Signed)
I tried to fax the lab orders and it failed twice.  I called pt and the number is actually the phone number for the lab.  I called and got VM and left message for a return call with their fax number.

## 2019-05-19 NOTE — Progress Notes (Signed)
cc'ed to pcp °

## 2019-05-21 NOTE — Telephone Encounter (Addendum)
Called again and left Vm for a return call. [phone]).   Pt and her husband are aware that I have been unable to speak with anyone.  They will try to find out the lab fax number and let me know.  If not, she will be back home on Monday.

## 2019-05-23 ENCOUNTER — Inpatient Hospital Stay: Payer: Medicare PPO | Admitting: Nurse Practitioner

## 2019-05-23 ENCOUNTER — Inpatient Hospital Stay: Payer: Medicare PPO

## 2019-05-29 ENCOUNTER — Telehealth: Payer: Self-pay | Admitting: *Deleted

## 2019-05-29 NOTE — Telephone Encounter (Signed)
Refaxed the orders with Dr. Oneida Alar name stamped on orders per request.

## 2019-05-29 NOTE — Telephone Encounter (Signed)
PT and her husband are aware that I have faxed the lab orders to the new number provided and it went through. However, I called the phone number and could not get through. They will check on it and let me know if there are any problems.

## 2019-05-29 NOTE — Telephone Encounter (Signed)
Pt's husband called to give [place] the fax number to send the order for blood work F: [phone]  P: [phone].  Pt wants [place] to call her after we send the order so she can go do the labs.  Pt number: [phone].  She has new insurance as of 05/27/2019.  [employer] ID: [id number] Group: [id number] Payer ID# [id number]

## 2019-06-11 ENCOUNTER — Telehealth: Payer: Self-pay | Admitting: Gastroenterology

## 2019-06-11 NOTE — Telephone Encounter (Signed)
PLEASE CALL PT. I REVIEWED HER LABS FROM SEP 10. HER BLOOD COUNT, LIVER ENZYMES, AND KIDNEY FUNCTION ARE NORMAL.

## 2019-06-11 NOTE — Telephone Encounter (Signed)
Noted. Pt notified of results.  

## 2019-10-28 ENCOUNTER — Encounter: Payer: Self-pay | Admitting: Gastroenterology
